# Patient Record
Sex: Female | Born: 1940
Health system: Southern US, Community
[De-identification: ages and names within clinical notes are randomized; demographics above are authoritative.]

## PROBLEM LIST (undated history)

## (undated) DIAGNOSIS — R0602 Shortness of breath: Secondary | ICD-10-CM

## (undated) DIAGNOSIS — R51 Headache: Secondary | ICD-10-CM

## (undated) DIAGNOSIS — G629 Polyneuropathy, unspecified: Secondary | ICD-10-CM

## (undated) DIAGNOSIS — G8929 Other chronic pain: Secondary | ICD-10-CM

## (undated) DIAGNOSIS — Z860100 Personal history of colon polyps, unspecified: Secondary | ICD-10-CM

## (undated) DIAGNOSIS — E785 Hyperlipidemia, unspecified: Secondary | ICD-10-CM

## (undated) DIAGNOSIS — I499 Cardiac arrhythmia, unspecified: Secondary | ICD-10-CM

## (undated) DIAGNOSIS — G2581 Restless legs syndrome: Secondary | ICD-10-CM

## (undated) DIAGNOSIS — K219 Gastro-esophageal reflux disease without esophagitis: Secondary | ICD-10-CM

## (undated) DIAGNOSIS — M1990 Unspecified osteoarthritis, unspecified site: Secondary | ICD-10-CM

## (undated) DIAGNOSIS — M549 Dorsalgia, unspecified: Secondary | ICD-10-CM

## (undated) DIAGNOSIS — H269 Unspecified cataract: Secondary | ICD-10-CM

## (undated) DIAGNOSIS — I10 Essential (primary) hypertension: Secondary | ICD-10-CM

## (undated) DIAGNOSIS — S83242A Other tear of medial meniscus, current injury, left knee, initial encounter: Secondary | ICD-10-CM

## (undated) DIAGNOSIS — M1712 Unilateral primary osteoarthritis, left knee: Secondary | ICD-10-CM

## (undated) DIAGNOSIS — Z9289 Personal history of other medical treatment: Secondary | ICD-10-CM

## (undated) HISTORY — PX: EYE SURGERY: SHX253

## (undated) HISTORY — PX: COLONOSCOPY: SHX174

## (undated) HISTORY — DX: Shortness of breath: R06.02

## (undated) HISTORY — DX: Restless legs syndrome: G25.81

## (undated) HISTORY — DX: Polyneuropathy, unspecified: G62.9

## (undated) HISTORY — PX: UPPER GI ENDOSCOPY: SHX6162

## (undated) HISTORY — PX: APPENDECTOMY: SHX54

---

## 1972-07-20 HISTORY — PX: TUBAL LIGATION: SHX77

## 1998-09-26 ENCOUNTER — Other Ambulatory Visit: Admission: RE | Admit: 1998-09-26 | Discharge: 1998-09-26 | Payer: Self-pay | Admitting: Obstetrics and Gynecology

## 1999-08-07 ENCOUNTER — Ambulatory Visit (HOSPITAL_COMMUNITY): Admission: RE | Admit: 1999-08-07 | Discharge: 1999-08-07 | Payer: Self-pay | Admitting: Internal Medicine

## 1999-08-07 ENCOUNTER — Encounter: Payer: Self-pay | Admitting: Internal Medicine

## 1999-11-05 ENCOUNTER — Ambulatory Visit (HOSPITAL_COMMUNITY): Admission: RE | Admit: 1999-11-05 | Discharge: 1999-11-05 | Payer: Self-pay | Admitting: Internal Medicine

## 1999-11-05 ENCOUNTER — Encounter: Payer: Self-pay | Admitting: Internal Medicine

## 2000-01-30 ENCOUNTER — Other Ambulatory Visit: Admission: RE | Admit: 2000-01-30 | Discharge: 2000-01-30 | Payer: Self-pay | Admitting: Obstetrics and Gynecology

## 2000-06-27 ENCOUNTER — Emergency Department (HOSPITAL_COMMUNITY): Admission: EM | Admit: 2000-06-27 | Discharge: 2000-06-27 | Payer: Self-pay | Admitting: Emergency Medicine

## 2000-08-28 ENCOUNTER — Emergency Department (HOSPITAL_COMMUNITY): Admission: EM | Admit: 2000-08-28 | Discharge: 2000-08-28 | Payer: Self-pay | Admitting: Emergency Medicine

## 2001-01-24 ENCOUNTER — Other Ambulatory Visit: Admission: RE | Admit: 2001-01-24 | Discharge: 2001-01-24 | Payer: Self-pay | Admitting: Obstetrics and Gynecology

## 2001-10-25 ENCOUNTER — Ambulatory Visit (HOSPITAL_COMMUNITY): Admission: RE | Admit: 2001-10-25 | Discharge: 2001-10-25 | Payer: Self-pay | Admitting: *Deleted

## 2001-10-25 ENCOUNTER — Encounter (INDEPENDENT_AMBULATORY_CARE_PROVIDER_SITE_OTHER): Payer: Self-pay | Admitting: Specialist

## 2002-03-02 ENCOUNTER — Other Ambulatory Visit: Admission: RE | Admit: 2002-03-02 | Discharge: 2002-03-02 | Payer: Self-pay | Admitting: Obstetrics & Gynecology

## 2002-03-08 ENCOUNTER — Encounter: Payer: Self-pay | Admitting: Obstetrics & Gynecology

## 2002-03-08 ENCOUNTER — Ambulatory Visit (HOSPITAL_COMMUNITY): Admission: RE | Admit: 2002-03-08 | Discharge: 2002-03-08 | Payer: Self-pay | Admitting: Obstetrics & Gynecology

## 2003-01-29 ENCOUNTER — Encounter: Payer: Self-pay | Admitting: Obstetrics & Gynecology

## 2003-01-29 ENCOUNTER — Ambulatory Visit (HOSPITAL_COMMUNITY): Admission: RE | Admit: 2003-01-29 | Discharge: 2003-01-29 | Payer: Self-pay | Admitting: Obstetrics & Gynecology

## 2003-01-31 ENCOUNTER — Emergency Department (HOSPITAL_COMMUNITY): Admission: EM | Admit: 2003-01-31 | Discharge: 2003-02-01 | Payer: Self-pay | Admitting: Emergency Medicine

## 2005-02-02 ENCOUNTER — Ambulatory Visit (HOSPITAL_COMMUNITY): Admission: RE | Admit: 2005-02-02 | Discharge: 2005-02-02 | Payer: Self-pay | Admitting: Obstetrics & Gynecology

## 2005-10-30 ENCOUNTER — Ambulatory Visit: Payer: Self-pay | Admitting: Internal Medicine

## 2005-11-09 ENCOUNTER — Ambulatory Visit (HOSPITAL_COMMUNITY): Admission: RE | Admit: 2005-11-09 | Discharge: 2005-11-09 | Payer: Self-pay | Admitting: Internal Medicine

## 2005-11-23 ENCOUNTER — Ambulatory Visit: Payer: Self-pay | Admitting: Internal Medicine

## 2007-02-01 ENCOUNTER — Ambulatory Visit (HOSPITAL_COMMUNITY): Admission: RE | Admit: 2007-02-01 | Discharge: 2007-02-01 | Payer: Self-pay | Admitting: Obstetrics & Gynecology

## 2008-07-20 HISTORY — PX: BACK SURGERY: SHX140

## 2008-11-14 ENCOUNTER — Encounter: Admission: RE | Admit: 2008-11-14 | Discharge: 2008-11-14 | Payer: Self-pay | Admitting: Rheumatology

## 2009-04-12 ENCOUNTER — Encounter: Admission: RE | Admit: 2009-04-12 | Discharge: 2009-04-12 | Payer: Self-pay | Admitting: Orthopaedic Surgery

## 2009-05-13 ENCOUNTER — Inpatient Hospital Stay (HOSPITAL_COMMUNITY): Admission: RE | Admit: 2009-05-13 | Discharge: 2009-05-16 | Payer: Self-pay | Admitting: Orthopaedic Surgery

## 2010-05-16 ENCOUNTER — Encounter: Admission: RE | Admit: 2010-05-16 | Discharge: 2010-05-16 | Payer: Self-pay | Admitting: Orthopaedic Surgery

## 2010-08-10 ENCOUNTER — Encounter: Payer: Self-pay | Admitting: Obstetrics & Gynecology

## 2010-10-23 LAB — TYPE AND SCREEN
ABO/RH(D): O POS
Antibody Screen: NEGATIVE

## 2010-10-23 LAB — COMPREHENSIVE METABOLIC PANEL
ALT: 27 U/L (ref 0–35)
AST: 20 U/L (ref 0–37)
Albumin: 4.1 g/dL (ref 3.5–5.2)
Alkaline Phosphatase: 61 U/L (ref 39–117)
Calcium: 9.2 mg/dL (ref 8.4–10.5)
GFR calc non Af Amer: 50 mL/min — ABNORMAL LOW (ref >60)
Potassium: 4.1 mEq/L (ref 3.5–5.1)
Sodium: 141 mEq/L (ref 135–145)
Total Bilirubin: 0.3 mg/dL (ref 0.3–1.2)

## 2010-10-23 LAB — HEMOGLOBIN AND HEMATOCRIT, BLOOD
HCT: 22.8 % — ABNORMAL LOW (ref 36.0–46.0)
HCT: 23.3 % — ABNORMAL LOW (ref 36.0–46.0)
HCT: 24.4 % — ABNORMAL LOW (ref 36.0–46.0)
Hemoglobin: 10.3 g/dL — ABNORMAL LOW (ref 12.0–15.0)
Hemoglobin: 7.7 g/dL — CL (ref 12.0–15.0)
Hemoglobin: 8 g/dL — ABNORMAL LOW (ref 12.0–15.0)
Hemoglobin: 8.3 g/dL — ABNORMAL LOW (ref 12.0–15.0)

## 2010-10-23 LAB — PREPARE RBC (CROSSMATCH)

## 2010-10-23 LAB — URINALYSIS, ROUTINE W REFLEX MICROSCOPIC
Protein, ur: NEGATIVE mg/dL
Specific Gravity, Urine: 1.016 (ref 1.005–1.030)
pH: 7.5 (ref 5.0–8.0)

## 2010-10-23 LAB — CBC: MCV: 92.8 fL (ref 78.0–100.0)

## 2010-10-23 LAB — BASIC METABOLIC PANEL
CO2: 25 mEq/L (ref 19–32)
Calcium: 7.3 mg/dL — ABNORMAL LOW (ref 8.4–10.5)
Chloride: 107 mEq/L (ref 96–112)

## 2010-10-23 LAB — DIFFERENTIAL
Basophils Relative: 0 % (ref 0–1)
Lymphocytes Relative: 51 % — ABNORMAL HIGH (ref 12–46)
Monocytes Absolute: 0.7 10*3/uL (ref 0.1–1.0)
Neutrophils Relative %: 40 % — ABNORMAL LOW (ref 43–77)

## 2010-10-23 LAB — POCT I-STAT 4, (NA,K, GLUC, HGB,HCT)
Glucose, Bld: 127 mg/dL — ABNORMAL HIGH (ref 70–99)
Hemoglobin: 10.9 g/dL — ABNORMAL LOW (ref 12.0–15.0)

## 2010-10-23 LAB — ABO/RH: ABO/RH(D): O POS

## 2010-12-05 NOTE — Procedures (Signed)
[place]. [facility]  Patient:    Melissa Maldonado, Melissa Maldonado Visit Number: [id number] MRN: [id number]          Service Type: END Location: ENDO Attending Physician:  Jim Desanctis Dictated by:   Jim Desanctis, M.D. Proc. Date: 10/25/01 Admit Date:  10/25/2001 Discharge Date: 10/25/2001                             Procedure Report  PROCEDURE PERFORMED:  Colonoscopy.  ENDOSCOPIST:  Jim Desanctis, M.D.  INDICATIONS FOR PROCEDURE:  Colon cancer screening.  ANESTHESIA:  Demerol 20 mg, Versed 2 mg.  DESCRIPTION OF PROCEDURE:  With the patient mildly sedated in the left lateral decubitus position, the Olympus videoscopic colonoscope was inserted in the rectum and passed under direct vision into the cecum.  The cecum was identified by the ileocecal valve and appendiceal orifice, both of which were photographed.  From this point, the colonoscope was slowly withdrawn, taking circumferential views of the entire colonic mucosa, stopping only in the rectum which appeared normal on direct and showed hemorrhoids on retroflex view.  The endoscope was straightened and withdrawn.  Patients vital signs and pulse oximeter remained stable.  The patient tolerated the procedure well and without apparent complications.  FINDINGS:  Internal hemorrhoids.  Otherwise unremarkable examination.  PLAN:  See endoscopy note for further details. Dictated by:   Jim Desanctis, M.D. Attending Physician:  Jim Desanctis DD:  10/25/01 TD:  10/25/01 Job: [id number]

## 2010-12-05 NOTE — Procedures (Signed)
[place]. [facility]  Patient:    Melissa Maldonado, Melissa Maldonado Visit Number: [id number] MRN: [id number]          Service Type: END Location: ENDO Attending Physician:  Jim Desanctis Dictated by:   Jim Desanctis, M.D. Admit Date:  10/25/2001 Discharge Date: 10/25/2001                             Procedure Report  PROCEDURE:  Upper endoscopy.  INDICATION:  GERD.  ANESTHESIA:  Demerol 50 mg, Versed 5 mg.  DESCRIPTION OF PROCEDURE:  With the patient mildly sedated in the left lateral decubitus position, the Olympus videoscopic endoscope was inserted in the mouth, passed under direct vision through the esophagus, which appeared normal, into the stomach.  Fundus, body were seen, as was the antrum. Ulcerations were seen in the antrum and body, which were photographed and biopsied.  We entered into the duodenal bulb and second portion of the duodenum, and both appeared normal.  From this point the endoscope was slowly withdrawn, taking circumferential views of the entire duodenal mucosa until the endoscope had been pulled back in the stomach, placed in retroflexion to view the stomach from below.  The endoscope was then straightened and withdrawn, taking circumferential views of the remaining gastric and esophageal mucosa.  The patients vital signs and pulse oximetry remained stable.  The patient tolerated the procedure well without apparent complications.  FINDINGS:  Ulcers of the stomach in antrum and body, biopsied.  PLAN:  Await biopsy report.  The patient will call me for results and follow up with me as an outpatient.  Proceed to colonoscopy as planned. Dictated by:   Jim Desanctis, M.D. Attending Physician:  Jim Desanctis DD:  10/25/01 TD:  10/25/01 Job: [id number]

## 2011-05-04 ENCOUNTER — Other Ambulatory Visit (HOSPITAL_COMMUNITY): Payer: Self-pay | Admitting: Neurological Surgery

## 2011-05-04 DIAGNOSIS — M545 Low back pain, unspecified: Secondary | ICD-10-CM

## 2011-05-07 ENCOUNTER — Ambulatory Visit (HOSPITAL_COMMUNITY)
Admission: RE | Admit: 2011-05-07 | Discharge: 2011-05-07 | Disposition: A | Discharge status: Home / Self Care | Payer: Medicare Other | Source: Ambulatory Visit | Attending: Neurological Surgery | Admitting: Neurological Surgery

## 2011-05-07 DIAGNOSIS — M51379 Other intervertebral disc degeneration, lumbosacral region without mention of lumbar back pain or lower extremity pain: Secondary | ICD-10-CM | POA: Insufficient documentation

## 2011-05-07 DIAGNOSIS — M545 Low back pain: Secondary | ICD-10-CM

## 2011-05-07 DIAGNOSIS — M5380 Other specified dorsopathies, site unspecified: Secondary | ICD-10-CM | POA: Insufficient documentation

## 2011-05-07 DIAGNOSIS — IMO0002 Reserved for concepts with insufficient information to code with codable children: Secondary | ICD-10-CM | POA: Insufficient documentation

## 2011-05-07 DIAGNOSIS — M5137 Other intervertebral disc degeneration, lumbosacral region: Secondary | ICD-10-CM | POA: Insufficient documentation

## 2011-05-07 MED ORDER — IOHEXOL 180 MG/ML  SOLN
14.0000 mL | Freq: Once | INTRAMUSCULAR | Status: AC | PRN
  Administered 2011-05-07: 14 mL via INTRATHECAL

## 2011-07-21 DIAGNOSIS — R0602 Shortness of breath: Secondary | ICD-10-CM

## 2011-07-21 HISTORY — DX: Shortness of breath: R06.02

## 2011-07-29 DIAGNOSIS — R0602 Shortness of breath: Secondary | ICD-10-CM | POA: Diagnosis not present

## 2011-08-05 DIAGNOSIS — M545 Low back pain, unspecified: Secondary | ICD-10-CM | POA: Diagnosis not present

## 2011-08-10 DIAGNOSIS — M545 Low back pain: Secondary | ICD-10-CM | POA: Diagnosis not present

## 2011-08-10 DIAGNOSIS — M25559 Pain in unspecified hip: Secondary | ICD-10-CM | POA: Diagnosis not present

## 2011-08-14 DIAGNOSIS — M545 Low back pain: Secondary | ICD-10-CM | POA: Diagnosis not present

## 2011-08-14 DIAGNOSIS — M25559 Pain in unspecified hip: Secondary | ICD-10-CM | POA: Diagnosis not present

## 2011-08-17 DIAGNOSIS — I2510 Atherosclerotic heart disease of native coronary artery without angina pectoris: Secondary | ICD-10-CM | POA: Diagnosis not present

## 2011-09-17 DIAGNOSIS — E7800 Pure hypercholesterolemia, unspecified: Secondary | ICD-10-CM | POA: Diagnosis not present

## 2011-09-17 DIAGNOSIS — Z79899 Other long term (current) drug therapy: Secondary | ICD-10-CM | POA: Diagnosis not present

## 2011-09-21 DIAGNOSIS — M2550 Pain in unspecified joint: Secondary | ICD-10-CM | POA: Diagnosis not present

## 2011-09-21 DIAGNOSIS — Z79899 Other long term (current) drug therapy: Secondary | ICD-10-CM | POA: Diagnosis not present

## 2011-09-21 DIAGNOSIS — E7800 Pure hypercholesterolemia, unspecified: Secondary | ICD-10-CM | POA: Diagnosis not present

## 2011-09-30 DIAGNOSIS — M545 Low back pain, unspecified: Secondary | ICD-10-CM | POA: Diagnosis not present

## 2011-10-05 DIAGNOSIS — M4310 Spondylolisthesis, site unspecified: Secondary | ICD-10-CM | POA: Diagnosis not present

## 2011-10-05 DIAGNOSIS — M1710 Unilateral primary osteoarthritis, unspecified knee: Secondary | ICD-10-CM | POA: Diagnosis not present

## 2011-10-05 DIAGNOSIS — M5137 Other intervertebral disc degeneration, lumbosacral region: Secondary | ICD-10-CM | POA: Diagnosis not present

## 2011-10-08 DIAGNOSIS — M545 Low back pain, unspecified: Secondary | ICD-10-CM | POA: Diagnosis not present

## 2011-10-08 DIAGNOSIS — IMO0002 Reserved for concepts with insufficient information to code with codable children: Secondary | ICD-10-CM | POA: Diagnosis not present

## 2011-11-02 DIAGNOSIS — M1710 Unilateral primary osteoarthritis, unspecified knee: Secondary | ICD-10-CM | POA: Diagnosis not present

## 2011-11-05 DIAGNOSIS — M545 Low back pain, unspecified: Secondary | ICD-10-CM | POA: Diagnosis not present

## 2011-11-05 DIAGNOSIS — M5137 Other intervertebral disc degeneration, lumbosacral region: Secondary | ICD-10-CM | POA: Diagnosis not present

## 2011-11-05 DIAGNOSIS — M961 Postlaminectomy syndrome, not elsewhere classified: Secondary | ICD-10-CM | POA: Diagnosis not present

## 2011-12-18 DIAGNOSIS — Z0000 Encounter for general adult medical examination without abnormal findings: Secondary | ICD-10-CM | POA: Diagnosis not present

## 2011-12-18 DIAGNOSIS — R61 Generalized hyperhidrosis: Secondary | ICD-10-CM | POA: Diagnosis not present

## 2011-12-18 DIAGNOSIS — I10 Essential (primary) hypertension: Secondary | ICD-10-CM | POA: Diagnosis not present

## 2011-12-23 DIAGNOSIS — R61 Generalized hyperhidrosis: Secondary | ICD-10-CM | POA: Diagnosis not present

## 2011-12-23 DIAGNOSIS — M1990 Unspecified osteoarthritis, unspecified site: Secondary | ICD-10-CM | POA: Diagnosis not present

## 2011-12-23 DIAGNOSIS — Z0000 Encounter for general adult medical examination without abnormal findings: Secondary | ICD-10-CM | POA: Diagnosis not present

## 2011-12-23 DIAGNOSIS — G2581 Restless legs syndrome: Secondary | ICD-10-CM | POA: Diagnosis not present

## 2011-12-23 DIAGNOSIS — R748 Abnormal levels of other serum enzymes: Secondary | ICD-10-CM | POA: Diagnosis not present

## 2011-12-30 DIAGNOSIS — M961 Postlaminectomy syndrome, not elsewhere classified: Secondary | ICD-10-CM | POA: Diagnosis not present

## 2012-01-13 DIAGNOSIS — M961 Postlaminectomy syndrome, not elsewhere classified: Secondary | ICD-10-CM | POA: Diagnosis not present

## 2012-01-18 DIAGNOSIS — Z803 Family history of malignant neoplasm of breast: Secondary | ICD-10-CM | POA: Diagnosis not present

## 2012-01-18 DIAGNOSIS — Z1231 Encounter for screening mammogram for malignant neoplasm of breast: Secondary | ICD-10-CM | POA: Diagnosis not present

## 2012-02-17 DIAGNOSIS — N951 Menopausal and female climacteric states: Secondary | ICD-10-CM | POA: Diagnosis not present

## 2012-02-17 DIAGNOSIS — N760 Acute vaginitis: Secondary | ICD-10-CM | POA: Diagnosis not present

## 2012-02-23 DIAGNOSIS — N951 Menopausal and female climacteric states: Secondary | ICD-10-CM | POA: Diagnosis not present

## 2012-03-08 ENCOUNTER — Other Ambulatory Visit: Payer: Self-pay | Admitting: Gastroenterology

## 2012-03-08 DIAGNOSIS — D129 Benign neoplasm of anus and anal canal: Secondary | ICD-10-CM | POA: Diagnosis not present

## 2012-03-08 DIAGNOSIS — D126 Benign neoplasm of colon, unspecified: Secondary | ICD-10-CM | POA: Diagnosis not present

## 2012-03-08 DIAGNOSIS — D128 Benign neoplasm of rectum: Secondary | ICD-10-CM | POA: Diagnosis not present

## 2012-03-08 DIAGNOSIS — K621 Rectal polyp: Secondary | ICD-10-CM | POA: Diagnosis not present

## 2012-03-08 DIAGNOSIS — K5730 Diverticulosis of large intestine without perforation or abscess without bleeding: Secondary | ICD-10-CM | POA: Diagnosis not present

## 2012-03-08 DIAGNOSIS — K620 Anal polyp: Secondary | ICD-10-CM | POA: Diagnosis not present

## 2012-03-08 DIAGNOSIS — Z1211 Encounter for screening for malignant neoplasm of colon: Secondary | ICD-10-CM | POA: Diagnosis not present

## 2012-03-08 DIAGNOSIS — K648 Other hemorrhoids: Secondary | ICD-10-CM | POA: Diagnosis not present

## 2012-03-15 DIAGNOSIS — H8110 Benign paroxysmal vertigo, unspecified ear: Secondary | ICD-10-CM | POA: Diagnosis not present

## 2012-03-23 DIAGNOSIS — B373 Candidiasis of vulva and vagina: Secondary | ICD-10-CM | POA: Diagnosis not present

## 2012-03-23 DIAGNOSIS — N951 Menopausal and female climacteric states: Secondary | ICD-10-CM | POA: Diagnosis not present

## 2012-03-24 DIAGNOSIS — H8110 Benign paroxysmal vertigo, unspecified ear: Secondary | ICD-10-CM | POA: Diagnosis not present

## 2012-04-07 DIAGNOSIS — H8110 Benign paroxysmal vertigo, unspecified ear: Secondary | ICD-10-CM | POA: Diagnosis not present

## 2012-04-13 DIAGNOSIS — M48061 Spinal stenosis, lumbar region without neurogenic claudication: Secondary | ICD-10-CM | POA: Diagnosis not present

## 2012-04-18 ENCOUNTER — Other Ambulatory Visit: Payer: Self-pay | Admitting: Neurological Surgery

## 2012-04-27 DIAGNOSIS — R0602 Shortness of breath: Secondary | ICD-10-CM | POA: Diagnosis not present

## 2012-04-27 DIAGNOSIS — I10 Essential (primary) hypertension: Secondary | ICD-10-CM | POA: Diagnosis not present

## 2012-04-27 DIAGNOSIS — I498 Other specified cardiac arrhythmias: Secondary | ICD-10-CM | POA: Diagnosis not present

## 2012-04-28 ENCOUNTER — Encounter (HOSPITAL_COMMUNITY): Payer: Self-pay | Admitting: Pharmacy Technician

## 2012-05-05 ENCOUNTER — Encounter (HOSPITAL_COMMUNITY)
Admission: RE | Admit: 2012-05-05 | Discharge: 2012-05-05 | Payer: Medicare Other | Source: Ambulatory Visit | Attending: Neurological Surgery | Admitting: Neurological Surgery

## 2012-05-05 NOTE — Pre-Procedure Instructions (Signed)
[place]  05/05/2012   Your procedure is scheduled on:  Fri, Oct 25 @ 11:25 AM  Report to [place] at 8:15 AM.  Call this number if you have problems the morning of surgery: [phone]   Remember:   Do not eat food:After Midnight.       Do not wear jewelry, make-up or nail polish.  Do not wear lotions, powders, or perfumes. You may wear deodorant.  Do not shave 48 hours prior to surgery.  Do not bring valuables to the hospital.  Contacts, dentures or bridgework may not be worn into surgery.  Leave suitcase in the car. After surgery it may be brought to your room.  For patients admitted to the hospital, checkout time is 11:00 AM the day of discharge.   Patients discharged the day of surgery will not be allowed to drive home.  Special Instructions: Shower using CHG 2 nights before surgery and the night before surgery.  If you shower the day of surgery use CHG.  Use special wash - you have one bottle of CHG for all showers.  You should use approximately 1/3 of the bottle for each shower.   Please read over the following fact sheets that you were given: Pain Booklet, Coughing and Deep Breathing, MRSA Information and Surgical Site Infection Prevention

## 2012-05-09 DIAGNOSIS — Z01810 Encounter for preprocedural cardiovascular examination: Secondary | ICD-10-CM | POA: Diagnosis not present

## 2012-05-09 DIAGNOSIS — R9439 Abnormal result of other cardiovascular function study: Secondary | ICD-10-CM | POA: Diagnosis not present

## 2012-05-09 DIAGNOSIS — I10 Essential (primary) hypertension: Secondary | ICD-10-CM | POA: Diagnosis not present

## 2012-05-09 DIAGNOSIS — E785 Hyperlipidemia, unspecified: Secondary | ICD-10-CM | POA: Diagnosis not present

## 2012-05-09 DIAGNOSIS — I498 Other specified cardiac arrhythmias: Secondary | ICD-10-CM | POA: Diagnosis not present

## 2012-05-10 ENCOUNTER — Encounter (HOSPITAL_COMMUNITY)
Admission: RE | Admit: 2012-05-10 | Discharge: 2012-05-10 | Disposition: A | Discharge status: Home / Self Care | Payer: Medicare Other | Source: Ambulatory Visit | Attending: Neurological Surgery | Admitting: Neurological Surgery

## 2012-05-10 ENCOUNTER — Encounter (HOSPITAL_COMMUNITY): Payer: Self-pay

## 2012-05-10 DIAGNOSIS — R0602 Shortness of breath: Secondary | ICD-10-CM | POA: Diagnosis not present

## 2012-05-10 DIAGNOSIS — I10 Essential (primary) hypertension: Secondary | ICD-10-CM | POA: Diagnosis not present

## 2012-05-10 HISTORY — DX: Unspecified osteoarthritis, unspecified site: M19.90

## 2012-05-10 HISTORY — DX: Hyperlipidemia, unspecified: E78.5

## 2012-05-10 HISTORY — DX: Other chronic pain: M54.9

## 2012-05-10 HISTORY — DX: Personal history of colonic polyps: Z86.010

## 2012-05-10 HISTORY — DX: Essential (primary) hypertension: I10

## 2012-05-10 HISTORY — DX: Personal history of colon polyps, unspecified: Z86.0100

## 2012-05-10 HISTORY — DX: Unspecified cataract: H26.9

## 2012-05-10 HISTORY — DX: Headache: R51

## 2012-05-10 HISTORY — DX: Gastro-esophageal reflux disease without esophagitis: K21.9

## 2012-05-10 HISTORY — DX: Personal history of other medical treatment: Z92.89

## 2012-05-10 HISTORY — DX: Other chronic pain: G89.29

## 2012-05-10 HISTORY — DX: Shortness of breath: R06.02

## 2012-05-10 LAB — BASIC METABOLIC PANEL
BUN: 12 mg/dL (ref 6–23)
CO2: 28 mEq/L (ref 19–32)
Chloride: 102 mEq/L (ref 96–112)
Creatinine, Ser: 0.96 mg/dL (ref 0.50–1.10)
GFR calc Af Amer: 68 mL/min — ABNORMAL LOW (ref >90)
Glucose, Bld: 85 mg/dL (ref 70–99)
Potassium: 3.8 mEq/L (ref 3.5–5.1)

## 2012-05-10 LAB — SURGICAL PCR SCREEN
MRSA, PCR: NEGATIVE
Staphylococcus aureus: NEGATIVE

## 2012-05-10 LAB — CBC
HCT: 41.4 % (ref 36.0–46.0)
Hemoglobin: 13.8 g/dL (ref 12.0–15.0)
MCH: 29.9 pg (ref 26.0–34.0)
MCHC: 33.3 g/dL (ref 30.0–36.0)
MCV: 89.6 fL (ref 78.0–100.0)
RDW: 13 % (ref 11.5–15.5)

## 2012-05-10 NOTE — Progress Notes (Signed)
Dr.Henry Tamala Julian is cardiologist-last visit on 05/09/12-to request report   Pt is wearing a holter monitor-placed yesterday d/t heart rate low-to return to his office today  Stress test done-Dr.Pharr with [facility] on [address] about a yr ago Doesn't recall ever having an echo or heart cath  Medical MD is Dr.Pharr   EKG done yesterday with Dr.Smith-to request report Denies CXR being done in the past yr

## 2012-05-11 DIAGNOSIS — I499 Cardiac arrhythmia, unspecified: Secondary | ICD-10-CM | POA: Diagnosis not present

## 2012-05-11 NOTE — Progress Notes (Signed)
Fax from Dr. Pennie Banter office reports no ECHO and stress.  Office Visit notes received on placed in physical chart.

## 2012-05-12 DIAGNOSIS — I498 Other specified cardiac arrhythmias: Secondary | ICD-10-CM | POA: Diagnosis not present

## 2012-05-12 MED ORDER — CEFAZOLIN SODIUM-DEXTROSE 2-3 GM-% IV SOLR
2.0000 g | INTRAVENOUS | Status: AC
  Administered 2012-05-13: 2 g via INTRAVENOUS
  Filled 2012-05-12: qty 50

## 2012-05-13 ENCOUNTER — Inpatient Hospital Stay (HOSPITAL_COMMUNITY)
Admission: RE | Admit: 2012-05-13 | Discharge: 2012-05-14 | DRG: 491 | Disposition: A | Discharge status: Home / Self Care | Payer: Medicare Other | Source: Ambulatory Visit | Attending: Neurological Surgery | Admitting: Neurological Surgery

## 2012-05-13 ENCOUNTER — Ambulatory Visit (HOSPITAL_COMMUNITY): Payer: Medicare Other

## 2012-05-13 ENCOUNTER — Encounter (HOSPITAL_COMMUNITY): Payer: Self-pay | Admitting: Vascular Surgery

## 2012-05-13 ENCOUNTER — Encounter (HOSPITAL_COMMUNITY): Payer: Self-pay

## 2012-05-13 ENCOUNTER — Encounter (HOSPITAL_COMMUNITY)
Admission: RE | Disposition: A | Discharge status: Home / Self Care | Payer: Self-pay | Source: Ambulatory Visit | Attending: Neurological Surgery

## 2012-05-13 ENCOUNTER — Ambulatory Visit (HOSPITAL_COMMUNITY): Payer: Medicare Other | Admitting: Vascular Surgery

## 2012-05-13 DIAGNOSIS — Z79899 Other long term (current) drug therapy: Secondary | ICD-10-CM | POA: Diagnosis not present

## 2012-05-13 DIAGNOSIS — IMO0002 Reserved for concepts with insufficient information to code with codable children: Secondary | ICD-10-CM | POA: Diagnosis present

## 2012-05-13 DIAGNOSIS — Z01812 Encounter for preprocedural laboratory examination: Secondary | ICD-10-CM

## 2012-05-13 DIAGNOSIS — M48062 Spinal stenosis, lumbar region with neurogenic claudication: Secondary | ICD-10-CM | POA: Diagnosis not present

## 2012-05-13 DIAGNOSIS — R0602 Shortness of breath: Secondary | ICD-10-CM | POA: Diagnosis not present

## 2012-05-13 DIAGNOSIS — G8929 Other chronic pain: Secondary | ICD-10-CM | POA: Diagnosis not present

## 2012-05-13 DIAGNOSIS — M48061 Spinal stenosis, lumbar region without neurogenic claudication: Secondary | ICD-10-CM | POA: Diagnosis not present

## 2012-05-13 DIAGNOSIS — Z886 Allergy status to analgesic agent status: Secondary | ICD-10-CM | POA: Diagnosis not present

## 2012-05-13 DIAGNOSIS — H409 Unspecified glaucoma: Secondary | ICD-10-CM | POA: Diagnosis present

## 2012-05-13 DIAGNOSIS — M519 Unspecified thoracic, thoracolumbar and lumbosacral intervertebral disc disorder: Secondary | ICD-10-CM | POA: Diagnosis not present

## 2012-05-13 DIAGNOSIS — K219 Gastro-esophageal reflux disease without esophagitis: Secondary | ICD-10-CM | POA: Diagnosis not present

## 2012-05-13 DIAGNOSIS — I10 Essential (primary) hypertension: Secondary | ICD-10-CM | POA: Diagnosis not present

## 2012-05-13 HISTORY — PX: LUMBAR LAMINECTOMY/DECOMPRESSION MICRODISCECTOMY: SHX5026

## 2012-05-13 SURGERY — LUMBAR LAMINECTOMY/DECOMPRESSION MICRODISCECTOMY 1 LEVEL
Anesthesia: General | Site: Back | Laterality: Bilateral | Wound class: Clean

## 2012-05-13 MED ORDER — MENTHOL 3 MG MT LOZG
1.0000 | LOZENGE | OROMUCOSAL | Status: DC | PRN
  Administered 2012-05-13: 3 mg via ORAL
  Filled 2012-05-13: qty 9

## 2012-05-13 MED ORDER — SUFENTANIL CITRATE 50 MCG/ML IV SOLN
INTRAVENOUS | Status: DC | PRN
  Administered 2012-05-13: 15 ug via INTRAVENOUS
  Administered 2012-05-13: 5 ug via INTRAVENOUS

## 2012-05-13 MED ORDER — KETOROLAC TROMETHAMINE 15 MG/ML IJ SOLN
15.0000 mg | Freq: Four times a day (QID) | INTRAMUSCULAR | Status: DC
  Administered 2012-05-13 – 2012-05-14 (×3): 15 mg via INTRAVENOUS
  Filled 2012-05-13 (×5): qty 1

## 2012-05-13 MED ORDER — THROMBIN 5000 UNITS EX SOLR
CUTANEOUS | Status: DC | PRN
  Administered 2012-05-13 (×2): 5000 [IU] via TOPICAL

## 2012-05-13 MED ORDER — BACITRACIN 50000 UNITS IM SOLR
INTRAMUSCULAR | Status: AC
  Filled 2012-05-13: qty 1

## 2012-05-13 MED ORDER — MORPHINE SULFATE 2 MG/ML IJ SOLN
1.0000 mg | INTRAMUSCULAR | Status: DC | PRN
Start: 2012-05-13 — End: 2012-05-14

## 2012-05-13 MED ORDER — MIDAZOLAM HCL 5 MG/5ML IJ SOLN
INTRAMUSCULAR | Status: DC | PRN
  Administered 2012-05-13: 2 mg via INTRAVENOUS

## 2012-05-13 MED ORDER — LIDOCAINE-EPINEPHRINE 1 %-1:100000 IJ SOLN
INTRAMUSCULAR | Status: DC | PRN
  Administered 2012-05-13: 10 mL

## 2012-05-13 MED ORDER — LOSARTAN POTASSIUM 25 MG PO TABS
25.0000 mg | ORAL_TABLET | Freq: Every day | ORAL | Status: DC
  Administered 2012-05-13 – 2012-05-14 (×2): 25 mg via ORAL
  Filled 2012-05-13 (×2): qty 1

## 2012-05-13 MED ORDER — DIAZEPAM 5 MG PO TABS: 5.0000 mg | ORAL_TABLET | Freq: Four times a day (QID) | ORAL | Status: DC | PRN

## 2012-05-13 MED ORDER — PHENOL 1.4 % MT LIQD: 1.0000 | OROMUCOSAL | Status: DC | PRN

## 2012-05-13 MED ORDER — OXYCODONE-ACETAMINOPHEN 5-325 MG PO TABS
1.0000 | ORAL_TABLET | ORAL | Status: DC | PRN
  Administered 2012-05-13 (×2): 2 via ORAL
  Filled 2012-05-13 (×2): qty 2

## 2012-05-13 MED ORDER — ARTIFICIAL TEARS OP OINT: TOPICAL_OINTMENT | OPHTHALMIC | Status: DC | PRN

## 2012-05-13 MED ORDER — LACTATED RINGERS IV SOLN
INTRAVENOUS | Status: DC | PRN
  Administered 2012-05-13 (×2): via INTRAVENOUS

## 2012-05-13 MED ORDER — GLYCOPYRROLATE 0.2 MG/ML IJ SOLN
INTRAMUSCULAR | Status: DC | PRN
  Administered 2012-05-13 (×2): 0.2 mg via INTRAVENOUS
  Administered 2012-05-13: 0.4 mg via INTRAVENOUS

## 2012-05-13 MED ORDER — SODIUM CHLORIDE 0.9 % IR SOLN
Status: DC | PRN
  Administered 2012-05-13: 13:00:00

## 2012-05-13 MED ORDER — ONDANSETRON HCL 4 MG/2ML IJ SOLN: 4.0000 mg | INTRAMUSCULAR | Status: DC | PRN

## 2012-05-13 MED ORDER — SODIUM CHLORIDE 0.9 % IJ SOLN: 3.0000 mL | INTRAMUSCULAR | Status: DC | PRN

## 2012-05-13 MED ORDER — HYDROMORPHONE HCL PF 1 MG/ML IJ SOLN: 0.2500 mg | INTRAMUSCULAR | Status: DC | PRN

## 2012-05-13 MED ORDER — CEFAZOLIN SODIUM 1-5 GM-% IV SOLN
1.0000 g | Freq: Three times a day (TID) | INTRAVENOUS | Status: AC
  Administered 2012-05-13 – 2012-05-14 (×2): 1 g via INTRAVENOUS
  Filled 2012-05-13 (×2): qty 50

## 2012-05-13 MED ORDER — HEMOSTATIC AGENTS (NO CHARGE) OPTIME
TOPICAL | Status: DC | PRN
  Administered 2012-05-13: 1 via TOPICAL

## 2012-05-13 MED ORDER — ALUM & MAG HYDROXIDE-SIMETH 200-200-20 MG/5ML PO SUSP: 30.0000 mL | Freq: Four times a day (QID) | ORAL | Status: DC | PRN

## 2012-05-13 MED ORDER — ACETAMINOPHEN 325 MG PO TABS: 650.0000 mg | ORAL_TABLET | ORAL | Status: DC | PRN

## 2012-05-13 MED ORDER — ATORVASTATIN CALCIUM 40 MG PO TABS
40.0000 mg | ORAL_TABLET | Freq: Every day | ORAL | Status: DC
  Administered 2012-05-13 – 2012-05-14 (×2): 40 mg via ORAL
  Filled 2012-05-13 (×2): qty 1

## 2012-05-13 MED ORDER — ONDANSETRON HCL 4 MG/2ML IJ SOLN
INTRAMUSCULAR | Status: DC | PRN
  Administered 2012-05-13: 4 mg via INTRAVENOUS

## 2012-05-13 MED ORDER — 0.9 % SODIUM CHLORIDE (POUR BTL) OPTIME
TOPICAL | Status: DC | PRN
  Administered 2012-05-13: 1000 mL

## 2012-05-13 MED ORDER — ROCURONIUM BROMIDE 100 MG/10ML IV SOLN
INTRAVENOUS | Status: DC | PRN
  Administered 2012-05-13: 50 mg via INTRAVENOUS

## 2012-05-13 MED ORDER — SODIUM CHLORIDE 0.9 % IV SOLN
INTRAVENOUS | Status: AC
  Filled 2012-05-13: qty 500

## 2012-05-13 MED ORDER — SODIUM CHLORIDE 0.9 % IV SOLN: 250.0000 mL | INTRAVENOUS | Status: DC

## 2012-05-13 MED ORDER — NEOSTIGMINE METHYLSULFATE 1 MG/ML IJ SOLN
INTRAMUSCULAR | Status: DC | PRN
  Administered 2012-05-13: 3 mg via INTRAVENOUS

## 2012-05-13 MED ORDER — EPHEDRINE SULFATE 50 MG/ML IJ SOLN
INTRAMUSCULAR | Status: DC | PRN
  Administered 2012-05-13 (×3): 5 mg via INTRAVENOUS

## 2012-05-13 MED ORDER — PROMETHAZINE HCL 25 MG/ML IJ SOLN: 6.2500 mg | INTRAMUSCULAR | Status: DC | PRN

## 2012-05-13 MED ORDER — LIDOCAINE HCL (CARDIAC) 20 MG/ML IV SOLN
INTRAVENOUS | Status: DC | PRN
  Administered 2012-05-13: 90 mg via INTRAVENOUS

## 2012-05-13 MED ORDER — BUPIVACAINE HCL (PF) 0.5 % IJ SOLN
INTRAMUSCULAR | Status: DC | PRN
  Administered 2012-05-13: 20 mL

## 2012-05-13 MED ORDER — ACETAMINOPHEN 650 MG RE SUPP: 650.0000 mg | RECTAL | Status: DC | PRN

## 2012-05-13 MED ORDER — PROPOFOL 10 MG/ML IV BOLUS
INTRAVENOUS | Status: DC | PRN
  Administered 2012-05-13: 20 mg via INTRAVENOUS
  Administered 2012-05-13: 160 mg via INTRAVENOUS

## 2012-05-13 MED ORDER — SODIUM CHLORIDE 0.9 % IJ SOLN: 3.0000 mL | Freq: Two times a day (BID) | INTRAMUSCULAR | Status: DC

## 2012-05-13 SURGICAL SUPPLY — 54 items
ADH SKN CLS APL DERMABOND .7 (GAUZE/BANDAGES/DRESSINGS) ×1
BAG DECANTER FOR FLEXI CONT (MISCELLANEOUS) ×2 IMPLANT
BLADE SURG ROTATE 9660 (MISCELLANEOUS) IMPLANT
BUR ACORN 6.0 (BURR) IMPLANT
BUR MATCHSTICK NEURO 3.0 LAGG (BURR) ×2 IMPLANT
CANISTER SUCTION 2500CC (MISCELLANEOUS) ×2 IMPLANT
CLOTH BEACON ORANGE TIMEOUT ST (SAFETY) ×2 IMPLANT
CONT SPEC 4OZ CLIKSEAL STRL BL (MISCELLANEOUS) ×2 IMPLANT
DECANTER SPIKE VIAL GLASS SM (MISCELLANEOUS) ×2 IMPLANT
DERMABOND ADVANCED (GAUZE/BANDAGES/DRESSINGS) ×1
DERMABOND ADVANCED .7 DNX12 (GAUZE/BANDAGES/DRESSINGS) ×1 IMPLANT
DRAPE MICROSCOPE LEICA (MISCELLANEOUS) IMPLANT
DRAPE PED LAPAROTOMY (DRAPES) ×2 IMPLANT
DRAPE POUCH INSTRU U-SHP 10X18 (DRAPES) ×2 IMPLANT
DRAPE PROXIMA HALF (DRAPES) IMPLANT
DURAPREP 26ML APPLICATOR (WOUND CARE) ×2 IMPLANT
ELECT REM PT RETURN 9FT ADLT (ELECTROSURGICAL) ×2
ELECTRODE REM PT RTRN 9FT ADLT (ELECTROSURGICAL) ×1 IMPLANT
GAUZE SPONGE 4X4 16PLY XRAY LF (GAUZE/BANDAGES/DRESSINGS) IMPLANT
GLOVE BIO SURGEON STRL SZ8 (GLOVE) ×2 IMPLANT
GLOVE BIOGEL PI IND STRL 8.5 (GLOVE) ×1 IMPLANT
GLOVE BIOGEL PI INDICATOR 8.5 (GLOVE) ×1
GLOVE ECLIPSE 7.5 STRL STRAW (GLOVE) ×2 IMPLANT
GLOVE ECLIPSE 8.5 STRL (GLOVE) ×2 IMPLANT
GLOVE EXAM NITRILE LRG STRL (GLOVE) IMPLANT
GLOVE EXAM NITRILE MD LF STRL (GLOVE) IMPLANT
GLOVE EXAM NITRILE XL STR (GLOVE) IMPLANT
GLOVE EXAM NITRILE XS STR PU (GLOVE) IMPLANT
GLOVE INDICATOR 7.5 STRL GRN (GLOVE) ×1 IMPLANT
GLOVE INDICATOR 8.5 STRL (GLOVE) ×1 IMPLANT
GLOVE OPTIFIT SS 8.5 STRL (GLOVE) ×1 IMPLANT
GOWN BRE IMP SLV AUR LG STRL (GOWN DISPOSABLE) IMPLANT
GOWN BRE IMP SLV AUR XL STRL (GOWN DISPOSABLE) IMPLANT
GOWN STRL REIN 2XL LVL4 (GOWN DISPOSABLE) ×2 IMPLANT
KIT BASIN OR (CUSTOM PROCEDURE TRAY) ×2 IMPLANT
KIT ROOM TURNOVER OR (KITS) ×2 IMPLANT
NDL SPNL 20GX3.5 QUINCKE YW (NEEDLE) IMPLANT
NEEDLE HYPO 22GX1.5 SAFETY (NEEDLE) ×2 IMPLANT
NEEDLE SPNL 20GX3.5 QUINCKE YW (NEEDLE) IMPLANT
NS IRRIG 1000ML POUR BTL (IV SOLUTION) ×2 IMPLANT
PACK LAMINECTOMY NEURO (CUSTOM PROCEDURE TRAY) ×2 IMPLANT
PAD ARMBOARD 7.5X6 YLW CONV (MISCELLANEOUS) ×6 IMPLANT
PATTIES SURGICAL .5 X1 (DISPOSABLE) ×2 IMPLANT
RUBBERBAND STERILE (MISCELLANEOUS) IMPLANT
SPONGE GAUZE 4X4 12PLY (GAUZE/BANDAGES/DRESSINGS) ×2 IMPLANT
SPONGE SURGIFOAM ABS GEL SZ50 (HEMOSTASIS) ×2 IMPLANT
SUT VIC AB 1 CT1 18XBRD ANBCTR (SUTURE) ×1 IMPLANT
SUT VIC AB 1 CT1 8-18 (SUTURE) ×2
SUT VIC AB 2-0 CP2 18 (SUTURE) ×2 IMPLANT
SUT VIC AB 3-0 SH 8-18 (SUTURE) ×3 IMPLANT
SYR 20ML ECCENTRIC (SYRINGE) ×2 IMPLANT
TOWEL OR 17X24 6PK STRL BLUE (TOWEL DISPOSABLE) ×2 IMPLANT
TOWEL OR 17X26 10 PK STRL BLUE (TOWEL DISPOSABLE) ×2 IMPLANT
WATER STERILE IRR 1000ML POUR (IV SOLUTION) ×2 IMPLANT

## 2012-05-13 NOTE — H&P (Signed)
CHIEF COMPLAINT:   Pain in the lower back status post surgical decompression and stabilization of L2-3 and L4-5 May 05, 2009.    HISTORY OF PRESENT ILLNESS:  Melissa Maldonado is a 71 year old, right-handed individual who tells me that she has had problems with chronic low back pain.  She had some difficulties with pain in her back prior to surgery, but in October of 2010 she underwent surgical decompression at L2-3 and L4-5 with a lumbar laminectomy from L2 to L5 via Dr. Rodell Perna.  She was initially hopeful that she was getting better, but over time she has noted that she has had persistence of pain in the center of the low back radiating into both buttocks.  This has become quite severe and extreme at times.  She tries to maintain an active lifestyle and she notes that she likes to walk every day, but somehow she has not been able to shake the pain in her back at all.  She has seen by Dr. Lorin Mercy a number of times and most recently he had given her a shot and advised her to contact him, but she notes that nothing has been changing and nothing substantive has been done.  She brings with her a CD of some films, including a CT scan of the lumbar spine performed on May 16, 2010.  The CT demonstrates posterolateral fixation at L2-3 and at L4-5 with interbody grafting.  There is also posterolateral bone graft present at each of these levels.  On the CT scan some of the images demonstrate some halo formation around the tops of the screws at L3 suggesting the possibility of a nonunion and, in fact, the interbody graft at L2-3 does not appear to be fully formed.  There is no mention of this made in the official report.  The L4-L5 vertebrae show no evidence of halo formation around the screws, but the degree to which the bone is actually healed is somewhat sparse.    PAST MEDICAL HISTORY:  [name] has generally been quite healthy.  She reports no significant medical problems.    MEDICATIONS:    Current medications  including some Osteo-Bi-Flex, Aleve, Estroblend, Alive vitamin supplement and Tru Biotics.    DRUG ALLERGIES:   SHE NOTES ALLERGIES TO ASPIRIN AND CODEINE BOTH OF WHICH CAUSE NAUSEA.    REVIEW OF SYSTEMS:   Systems review is notable for some history of glaucoma and arthritis.    PHYSICAL EXAMINATION:  On physical examination, she is alert and oriented.  She stands straight and erect and walks without any obvious antalgia and her motor strength appears good in the major groups including the iliopsoas, quad, tibialis anterior and gastrocs with preserved reflexes at 2+ in the patellae and the Achilles both.  Her back shows a well-healed midline incision extending from the region of L1 down to the sacrum.  There is no direct tenderness to palpation or percussion of her lumbar spine.    IMPRESSION:    The patient has evidence of chronic back pain status post a 2-level arthrodesis at L2-3 and L4-5.  She has had workup including a CT scan a year ago, but she has not had any subsequent studies that I can see.  She likely had some plain x-rays in Dr. Lorin Mercy' office.  I have suggested that it would be best to work her up with a myelogram and postmyelogram CAT scan.  This would give [place] the best information about any residual nerve root compromise and also  give [place] an opportunity to examine the stability and strength of her arthrodesis.  Based on these studies, we can make a decision as to whether any surgical intervention remains or whether we will need to focus on conservative efforts to help control her pain.  She does not appear to have been exposed to any epidural steroid injections or nerve root blocks which might give her some relief.  Before we proceed in that direction, I believe that an imaging study would be the appropriate way to work her up.     04/13/2012:  Jaimya returns to the office today.  I last visited with her the end of June and she was having increasing symptoms, and I reviewed her myelogram and  post-myelogram CT scan.  To discuss the nature and the severity of her stenosis at the level of L1-2 above her previous arthrodesis at L2-3 and L4-5, I noted that the most likely level that I expected problems, that is L3-4, is quite normal.  [name] has been having problems with what appears to be neurogenic claudication symptoms, that is pain radiating across her buttocks and into her thighs bilaterally. This limits her activity and her ability to tolerate any activity on her legs to any significant degree.  I noted that the stenosis may indeed cause these types of claudication symptoms and we had previously discussed considering surgical decompression of L1-2.  Listening to her symptoms again today in the office, I indeed conclude that the stenosis may be aggravating these symptoms.    Ultimately, the decision to proceed with surgery is one made by how the pain and dysfunction impacts her lifestyle.  If it is tolerable she may wish to continue conservatively.  However, if the pain is becoming increasingly intolerable or her level of activity is such that she cannot tolerate even activities of daily living, then I believe bilateral laminotomies at the L1-2 level is an appropriate consideration for this condition.    [name] has some concerns about the surgery, namely how long will she be disabled, when will she be able to do some significant lifting.  She notes to me that she has just had the birth of her grandchild and would like to be able to carry the child when appropriate. I indicated that for four weeks after surgery she should not do any substantial lifting. She may hold the baby certainly if it is put in her arms, but she needs to be mindful of her posture, sitting straight, walking straight, and standing straight to allow this process to heal.  Thereafter she should be able to function in a fairly unlimited fashion.  I noted to [name] that the surgery does not render her back new, but it does eliminate  this process of the stenosis which is compressing the nerves in her spinal canal and aggravating symptoms further downstream in her legs.    05/13/2012: System is admitted for surgery today. Her questions have been answered regarding the nature of surgery no changes are noted in her history or physical examination from previous documentations. Decompression at L1-L2 is being planned.

## 2012-05-13 NOTE — Progress Notes (Signed)
Transported via stretcher with monitor on room air Assisted up to bathrm without difficulty

## 2012-05-13 NOTE — Progress Notes (Signed)
Dr. Tobias Alexander and dr. Ellene Route at bedside

## 2012-05-13 NOTE — Progress Notes (Signed)
Subjective: Patient reports Offers no complaints  Objective: Vital signs in last 24 hours: Temp:  [97.8 F (36.6 C)] 97.8 F (36.6 C) (10/25 0938) Pulse Rate:  [40-68] 68  (10/25 1523) Resp:  [12-18] 18  (10/25 1530) BP: (141-177)/(61-83) 141/61 mmHg (10/25 1530) SpO2:  [97 %-99 %] 97 % (10/25 1530)  Intake/Output from previous day:   Intake/Output this shift: Total I/O In: 1400 [I.V.:1400] Out: 50 [Blood:50]  Incision is clean and dry motor function in lower extremities is grossly intact  Lab Results: No results found for this basename: WBC:2,HGB:2,HCT:2,PLT:2 in the last 72 hours BMET No results found for this basename: NA:2,K:2,CL:2,CO2:2,GLUCOSE:2,BUN:2,CREATININE:2,CALCIUM:2 in the last 72 hours  Studies/Results: No results found.  Assessment/Plan: Stable postop  LOS: 0 days  Mobilize next day or 2 for imminent discharge home   Senica Crall J 05/13/2012, 3:41 PM

## 2012-05-13 NOTE — Transfer of Care (Signed)
Immediate Anesthesia Transfer of Care Note  Patient: Melissa Maldonado  Procedure(s) Performed: Procedure(s) (LRB) with comments: LUMBAR LAMINECTOMY/DECOMPRESSION MICRODISCECTOMY 1 LEVEL (Bilateral) - Bilateral Lumbar one -two Decompressive Laminectomy  Patient Location: PACU  Anesthesia Type: General  Level of Consciousness: awake  Airway & Oxygen Therapy: Patient Spontanous Breathing and Patient connected to nasal cannula oxygen  Post-op Assessment: Report given to PACU RN, Post -op Vital signs reviewed and stable and Patient moving all extremities  Post vital signs: Reviewed and stable  Complications: No apparent anesthesia complications

## 2012-05-13 NOTE — Progress Notes (Signed)
Chaplain Wilber Oliphant called and referred pt. I found pt in short-stay waiting area. Her husband and best friends were bedside. After introductions, I had prayer w/pt and family. Pt and friends were very thankful.  Ernest Haber Chaplain

## 2012-05-13 NOTE — Anesthesia Preprocedure Evaluation (Addendum)
Anesthesia Evaluation  Patient identified by MRN, date of birth, ID band Patient awake    Reviewed: Allergy & Precautions, H&P , NPO status , Patient's Chart, lab work & pertinent test results, reviewed documented beta blocker date and time   History of Anesthesia Complications Negative for: history of anesthetic complications  Airway Mallampati: II TM Distance: >3 FB Neck ROM: Full    Dental No notable dental hx. (+) Teeth Intact and Dental Advisory Given   Pulmonary neg pulmonary ROS, shortness of breath and with exertion,    Pulmonary exam normal       Cardiovascular hypertension, Pt. on medications     Neuro/Psych  Headaches,    GI/Hepatic Neg liver ROS, GERD-  Controlled,  Endo/Other  negative endocrine ROS  Renal/GU negative Renal ROS     Musculoskeletal   Abdominal   Peds  Hematology negative hematology ROS (+)   Anesthesia Other Findings   Reproductive/Obstetrics                          Anesthesia Physical Anesthesia Plan  ASA: II  Anesthesia Plan: General   Post-op Pain Management:    Induction:   Airway Management Planned: Oral ETT  Additional Equipment:   Intra-op Plan:   Post-operative Plan: Extubation in OR  Informed Consent: I have reviewed the patients History and Physical, chart, labs and discussed the procedure including the risks, benefits and alternatives for the proposed anesthesia with the patient or authorized representative who has indicated his/her understanding and acceptance.   Dental advisory given  Plan Discussed with: CRNA, Anesthesiologist and Surgeon  Anesthesia Plan Comments:         Anesthesia Quick Evaluation

## 2012-05-13 NOTE — Anesthesia Postprocedure Evaluation (Signed)
Anesthesia Post Note  Patient: Melissa Maldonado  Procedure(s) Performed: Procedure(s) (LRB): LUMBAR LAMINECTOMY/DECOMPRESSION MICRODISCECTOMY 1 LEVEL (Bilateral)  Anesthesia type: general  Patient location: PACU  Post pain: Pain level controlled  Post assessment: Patient's Cardiovascular Status Stable  Last Vitals:  Filed Vitals:   05/13/12 1530  BP: 141/61  Pulse: 72  Temp:   Resp: 18    Post vital signs: Reviewed and stable  Level of consciousness: sedated  Complications: No apparent anesthesia complications

## 2012-05-13 NOTE — Progress Notes (Signed)
Dr Tobias Alexander called regarding patient's low heart rate down in 30's he suggests to be on a monitor over night. Dr. Ellene Route paged came by bedside and said amy order a telly bed

## 2012-05-13 NOTE — Progress Notes (Signed)
Pt denies pain just has sore throat taking ice chips without difficulty trying to clear throat . Instructed patient from ETT

## 2012-05-13 NOTE — Progress Notes (Signed)
Patient ID: Melissa Maldonado, female   DOB: 28-Mar-1941, 71 y.o.   MRN: [id number] Heart rate remains low 38-40 we'll place patient on telemetry and [place].

## 2012-05-13 NOTE — Preoperative (Signed)
Beta Blockers   Reason not to administer Beta Blockers:Not Applicable 

## 2012-05-13 NOTE — Op Note (Signed)
Preoperative diagnosis: L1-L2 spinal stenosis Postoperative diagnosis: L1-L2 spinal stenosis Procedure: Bilateral laminotomies L1-L2 decompression of the spinal stenosis Surgeon: [name] M.D. Assistant: Earle Gell M.D. Anesthesia: Gen. endotracheal Indications: Melissa Maldonado systems a 71 year old individual is had significant back pain with neurogenic claudication symptoms in both her lower extremities this is been getting progressively worse. She's undergone a fusion from L2-L3 and L4-L5 with implanted hardware this was done by Dr. Elta Guadeloupe 8 some years ago myelogram demonstrates that she has central canal stenosis at the level of L1-L2. She's been advised regarding the need for surgical decompression.  Procedure: Patient was brought to the operating room supine on a stretcher. After the smooth induction of general endotracheal anesthesia he was turned prone onto the operating table. The back was prepped with alcohol and DuraPrep and draped in a sterile fashion. Localizing radiographs identified the interspace at L1-L2. A midline incision was created and carried down to the lumbar dorsal fascia which was opened on either side of midline at this level. The dissection was carried out over the interlaminar space and the facet joints at L1-L2. A self-retaining retractor was placed in the wound. A high-speed drill was then used to remove the inferior margin of the lamina out to the medial wall the facet performing the initial portion of the dissection. The yellow ligament was then taken up and removed. Common dural tube was identified and dissection was carefully undertaken removing redundant yellow ligament and overgrown facet from the superior facet of L1-L2 and the laminar arch of L1. A foraminotomy was created over the L2 nerve root. Ball-tipped nerve hooks were then able to be inserted up and down the spinal canal to check the patency of the common dural tube.   Once an adequate decompression was identified and  secured, hemostasis and the soft tissues obtained meticulously and when verified retractor was removed the wound was irrigated copiously with antibiotic irrigating solution, and then the lumbar dorsal fascia was closed with #1 Vicryl in interrupted fashion. 20 cc Of half percent Marcaine was injected into the paraspinous fascia. 2-0 Vicryl was used to close the subcutaneous fascia and 3-0 Vicryl was used to close the subcuticular skin. Blood loss was estimated as 75 cc. The patient was returned to the recovery room in stable condition

## 2012-05-14 MED ORDER — DIAZEPAM 5 MG PO TABS: 5.0000 mg | ORAL_TABLET | Freq: Four times a day (QID) | ORAL | Status: DC | PRN

## 2012-05-14 MED ORDER — OXYCODONE-ACETAMINOPHEN 5-325 MG PO TABS: 1.0000 | ORAL_TABLET | ORAL | Status: DC | PRN

## 2012-05-14 NOTE — Evaluation (Signed)
Physical Therapy Evaluation Patient Details Name: Melissa Maldonado MRN: [id number] DOB: 1940-08-21 Today's Date: 05/14/2012 Time: 1036-1050 PT Time Calculation (min): 14 min  PT Assessment / Plan / Recommendation Clinical Impression  Pt s/p L1-2 lami/microdiscectomy. Pt is at her baseline functional mobility and is able to verbalize and maintain all back precautions throughout session. No further PT needs, will not follow    PT Assessment  Patent does not need any further PT services    Follow Up Recommendations  No PT follow up          Equipment Recommendations  None recommended by OT;None recommended by PT          Precautions / Restrictions Precautions Precautions: Back Precaution Booklet Issued: Yes (comment) Restrictions Weight Bearing Restrictions: No   Pertinent Vitals/Pain No pain      Mobility  Bed Mobility Bed Mobility: Rolling Left;Left Sidelying to Sit Rolling Left: 7: Independent Left Sidelying to Sit: HOB flat;7: Independent Transfers Transfers: Sit to Stand;Stand to Sit Sit to Stand: 7: Independent;From bed;From toilet Stand to Sit: 7: Independent;To toilet;To chair/3-in-1 Ambulation/Gait Ambulation/Gait Assistance: 7: Independent Ambulation Distance (Feet): 400 Feet Assistive device: None Ambulation/Gait Assistance Details: No deficits or cueing needed Gait Pattern: Within Functional Limits Stairs: Yes Stairs Assistance: 5: Supervision Stairs Assistance Details (indicate cue type and reason): No cueing needed. Supervision for safety Stair Management Technique: One rail Left;Step to pattern;Forwards Number of Stairs: 16         Visit Information  Last PT Received On: 05/14/12 Assistance Needed: +1 PT/OT Co-Evaluation/Treatment: Yes    Subjective Data  Patient Stated Goal: to go home now   Prior Functioning  Home Living Lives With: Spouse Available Help at Discharge: Available PRN/intermittently Type of Home: House Home Access:  Stairs to enter [employer] of Steps: 6 Entrance Stairs-Rails: Left;Right;Can reach both Home Layout: Two level Alternate Level Stairs-Number of Steps: 12 Alternate Level Stairs-Rails: Left Bathroom Shower/Tub: Tub/shower unit;Door;Curtain;Walk-in shower Bathroom Toilet: Handicapped height Home Adaptive Equipment: Walker - rolling;Bedside commode/3-in-1;Shower chair with back;Straight cane Prior Function Level of Independence: Independent Able to Take Stairs?: Yes Driving: Yes Vocation: Retired 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 Dominant Hand: Right    Cognition  Overall Cognitive Status: Appears within functional limits for tasks assessed/performed Arousal/Alertness: Awake/alert Orientation Level: Appears intact for tasks assessed Behavior During Session: [facility] for tasks performed    Extremity/Trunk Assessment Right Upper Extremity Assessment RUE ROM/Strength/Tone: [facility] for tasks assessed Left Upper Extremity Assessment LUE ROM/Strength/Tone: WFL for tasks assessed Right Lower Extremity Assessment RLE ROM/Strength/Tone: Within functional levels RLE Sensation: WFL - Light Touch Left Lower Extremity Assessment LLE ROM/Strength/Tone: Within functional levels LLE Sensation: WFL - Light Touch Trunk Assessment Trunk Assessment: Normal   Balance    End of Session PT - End of Session Equipment Utilized During Treatment: Gait belt Activity Tolerance: Patient tolerated treatment well Patient left: in chair;with call bell/phone within reach Nurse Communication: Mobility status   Ambrose Finland 05/14/2012, 11:31 AM  05/14/2012 Ambrose Finland DPT PAGER: [phone] OFFICE: [phone]

## 2012-05-14 NOTE — Discharge Summary (Signed)
  Physician Discharge Summary  Patient ID: EBONI BAMBRICK MRN: [id number] DOB/AGE: October 08, 1940 71 y.o.  Admit date: 05/13/2012 Discharge date: 05/14/2012  Admission Diagnoses: L1-2 spinal stenosis, lumbago, lumbar radiculopathy  Discharge Diagnoses: The same Active Problems:  * No active hospital problems. *    Discharged Condition: good  Hospital Course: Dr. Ellene Route admitted the patient to [facility] on 05/13/2012. On that day performed a bilateral L1-2 laminotomy foraminotomy.  The patient's postoperative course was unremarkable. On postop day #1 she requested discharge to home. The patient was given oral and written discharge instructions. All her questions were answered.  Consults: None Significant Diagnostic Studies: None Treatments: L1-2 laminotomy foraminotomy Discharge Exam: Blood pressure 101/54, pulse 97, temperature 97.8 F (36.6 C), temperature source Oral, resp. rate 18, height 5\' 5"  (1.651 m), weight 89.812 kg (198 lb), SpO2 95.00%. Patient is alert and oriented. Her strength is grossly normal in her lower extremities.  Disposition: Home  Discharge Orders    Future Orders Please Complete By Expires   Diet - low sodium heart healthy      Increase activity slowly      Discharge instructions      Comments:   Call [phone] for followup appointment.   Remove dressing in 48 hours      Call MD for:  temperature >100.4      Call MD for:  persistant nausea and vomiting      Call MD for:  severe uncontrolled pain      Call MD for:  redness, tenderness, or signs of infection (pain, swelling, redness, odor or green/yellow discharge around incision site)      Call MD for:  difficulty breathing, headache or visual disturbances      Call MD for:  hives      Call MD for:  persistant dizziness or light-headedness      Call MD for:  extreme fatigue          Medication List     As of 05/14/2012 10:05 AM    TAKE these medications         atorvastatin 40 MG  tablet   Commonly known as: LIPITOR   Take 40 mg by mouth daily.      co-enzyme Q-10 30 MG capsule   Take 30 mg by mouth daily.      diazepam 5 MG tablet   Commonly known as: VALIUM   Take 1 tablet (5 mg total) by mouth every 6 (six) hours as needed.      fish oil-omega-3 fatty acids 1000 MG capsule   Take 1 g by mouth daily.      losartan 25 MG tablet   Commonly known as: COZAAR   Take 25 mg by mouth daily.      meloxicam 7.5 MG tablet   Commonly known as: MOBIC   Take 7.5 mg by mouth daily.      multivitamin with minerals Tabs   Take 1 tablet by mouth daily.      oxyCODONE-acetaminophen 5-325 MG per tablet   Commonly known as: PERCOCET/ROXICET   Take 1-2 tablets by mouth every 4 (four) hours as needed.         SignedNewman Pies D 05/14/2012, 10:05 AM

## 2012-05-14 NOTE — Evaluation (Signed)
Occupational Therapy Evaluation Patient Details Name: Melissa Maldonado MRN: [id number] DOB: Dec 21, 1940 Today's Date: 05/14/2012 Time: [id number] OT Time Calculation (min): 16 min  OT Assessment / Plan / Recommendation Clinical Impression  Pt is recovering from back surgery.  Pt is modified independent in ADL and independent in mobility and knowledgeable in back precautions.  No futher OT needs.    OT Assessment  Patient does not need any further OT services    Follow Up Recommendations  No OT follow up    Barriers to Discharge      Equipment Recommendations  None recommended by OT    Recommendations for Other Services    Frequency       Precautions / Restrictions Precautions Precautions: Back Precaution Booklet Issued: Yes (comment) Restrictions Weight Bearing Restrictions: No   Pertinent Vitals/Pain No c/o pain    ADL  Eating/Feeding: Simulated;Independent Where Assessed - Eating/Feeding: Chair Grooming: Simulated;Independent Where Assessed - Grooming: Unsupported standing Upper Body Bathing: Simulated;Independent Where Assessed - Upper Body Bathing: Unsupported sitting Lower Body Bathing: Simulated;Modified independent Where Assessed - Lower Body Bathing: Unsupported sit to stand Upper Body Dressing: Performed;Independent Where Assessed - Upper Body Dressing: Unsupported sitting Lower Body Dressing: Performed;Modified independent Where Assessed - Lower Body Dressing: Unsupported sit to stand Toilet Transfer: Performed;Modified independent Toilet Transfer Method: Sit to Loss adjuster, chartered: Comfort height toilet Toileting - Clothing Manipulation and Hygiene: Simulated;Modified independent Where Assessed - Toileting Clothing Manipulation and Hygiene: Sit to stand from 3-in-1 or toilet Transfers/Ambulation Related to ADLs: Independent without device ADL Comments: Pt is knowledgeble in back precautions related to ADL.    OT Diagnosis:    OT Problem List:    OT Treatment Interventions:     OT Goals    Visit Information  Last OT Received On: 05/14/12 Assistance Needed: +1 PT/OT Co-Evaluation/Treatment: Yes    Subjective Data  Subjective: "I'm ready to go home." Patient Stated Goal: Return to PLOF.   Prior Functioning     Home Living Lives With: Spouse Available Help at Discharge: Available PRN/intermittently Type of Home: House Home Access: Stairs to enter [employer] of Steps: 6 Entrance Stairs-Rails: Left;Right;Can reach both Home Layout: Two level Alternate Level Stairs-Number of Steps: 12 Alternate Level Stairs-Rails: Left Bathroom Shower/Tub: Tub/shower unit;Door;Curtain;Walk-in shower Bathroom Toilet: Handicapped height Home Adaptive Equipment: Walker - rolling;Bedside commode/3-in-1;Shower chair with back;Straight cane Prior Function Level of Independence: Independent Able to Take Stairs?: Yes Driving: Yes Vocation: Retired 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 Dominant Hand: Right         Vision/Perception     Cognition  Overall Cognitive Status: Appears within functional limits for tasks assessed/performed Arousal/Alertness: Awake/alert Orientation Level: Appears intact for tasks assessed Behavior During Session: [facility] for tasks performed    Extremity/Trunk Assessment Right Upper Extremity Assessment RUE ROM/Strength/Tone: [facility] for tasks assessed Left Upper Extremity Assessment LUE ROM/Strength/Tone: WFL for tasks assessed Trunk Assessment Trunk Assessment: Normal     Mobility Bed Mobility Bed Mobility: Rolling Left;Left Sidelying to Sit Rolling Left: 7: Independent Left Sidelying to Sit: HOB flat;7: Independent Transfers Transfers: Sit to Stand;Stand to Sit Sit to Stand: 7: Independent;From bed;From toilet Stand to Sit: 7: Independent;To toilet;To chair/3-in-1     Shoulder Instructions     Exercise     Balance     End of Session OT - End of Session Activity Tolerance:  Patient tolerated treatment well Patient left: in chair;with call bell/phone within reach Nurse Communication: Mobility status;Other (comment) (HR )  GO  Malka So 05/14/2012, 11:01 AM [phone]

## 2012-05-16 ENCOUNTER — Encounter (HOSPITAL_COMMUNITY): Payer: Self-pay | Admitting: Neurological Surgery

## 2012-07-04 DIAGNOSIS — H2510 Age-related nuclear cataract, unspecified eye: Secondary | ICD-10-CM | POA: Diagnosis not present

## 2012-07-04 DIAGNOSIS — H43399 Other vitreous opacities, unspecified eye: Secondary | ICD-10-CM | POA: Diagnosis not present

## 2012-07-04 DIAGNOSIS — H40019 Open angle with borderline findings, low risk, unspecified eye: Secondary | ICD-10-CM | POA: Diagnosis not present

## 2012-07-06 DIAGNOSIS — M48061 Spinal stenosis, lumbar region without neurogenic claudication: Secondary | ICD-10-CM | POA: Diagnosis not present

## 2012-07-08 ENCOUNTER — Other Ambulatory Visit: Payer: Self-pay | Admitting: Neurological Surgery

## 2012-07-08 DIAGNOSIS — M48061 Spinal stenosis, lumbar region without neurogenic claudication: Secondary | ICD-10-CM

## 2012-07-18 ENCOUNTER — Ambulatory Visit
Admission: RE | Admit: 2012-07-18 | Discharge: 2012-07-18 | Disposition: A | Discharge status: Home / Self Care | Payer: Medicare Other | Source: Ambulatory Visit | Attending: Neurological Surgery | Admitting: Neurological Surgery

## 2012-07-18 DIAGNOSIS — M47817 Spondylosis without myelopathy or radiculopathy, lumbosacral region: Secondary | ICD-10-CM | POA: Diagnosis not present

## 2012-07-18 DIAGNOSIS — M48061 Spinal stenosis, lumbar region without neurogenic claudication: Secondary | ICD-10-CM

## 2012-07-18 MED ORDER — GADOBENATE DIMEGLUMINE 529 MG/ML IV SOLN
10.0000 mL | Freq: Once | INTRAVENOUS | Status: AC | PRN
  Administered 2012-07-18: 10 mL via INTRAVENOUS

## 2012-08-11 DIAGNOSIS — G2581 Restless legs syndrome: Secondary | ICD-10-CM | POA: Diagnosis not present

## 2012-08-11 DIAGNOSIS — M6530 Trigger finger, unspecified finger: Secondary | ICD-10-CM | POA: Diagnosis not present

## 2012-08-18 DIAGNOSIS — G2581 Restless legs syndrome: Secondary | ICD-10-CM | POA: Diagnosis not present

## 2012-09-08 DIAGNOSIS — M48061 Spinal stenosis, lumbar region without neurogenic claudication: Secondary | ICD-10-CM | POA: Diagnosis not present

## 2012-09-12 ENCOUNTER — Other Ambulatory Visit (HOSPITAL_COMMUNITY): Payer: Self-pay | Admitting: Neurological Surgery

## 2012-09-12 ENCOUNTER — Other Ambulatory Visit: Payer: Self-pay | Admitting: Neurological Surgery

## 2012-09-12 DIAGNOSIS — M48061 Spinal stenosis, lumbar region without neurogenic claudication: Secondary | ICD-10-CM

## 2012-09-19 DIAGNOSIS — H40019 Open angle with borderline findings, low risk, unspecified eye: Secondary | ICD-10-CM | POA: Diagnosis not present

## 2012-09-20 ENCOUNTER — Encounter (HOSPITAL_COMMUNITY): Payer: Self-pay

## 2012-09-21 ENCOUNTER — Ambulatory Visit (HOSPITAL_COMMUNITY)
Admission: RE | Admit: 2012-09-21 | Discharge: 2012-09-21 | Disposition: A | Discharge status: Home / Self Care | Payer: Medicare Other | Source: Ambulatory Visit | Attending: Neurological Surgery | Admitting: Neurological Surgery

## 2012-09-21 DIAGNOSIS — M48061 Spinal stenosis, lumbar region without neurogenic claudication: Secondary | ICD-10-CM

## 2012-09-21 DIAGNOSIS — Z981 Arthrodesis status: Secondary | ICD-10-CM | POA: Diagnosis not present

## 2012-09-21 DIAGNOSIS — M545 Low back pain, unspecified: Secondary | ICD-10-CM | POA: Insufficient documentation

## 2012-09-21 DIAGNOSIS — M5137 Other intervertebral disc degeneration, lumbosacral region: Secondary | ICD-10-CM | POA: Diagnosis not present

## 2012-09-21 DIAGNOSIS — M79609 Pain in unspecified limb: Secondary | ICD-10-CM | POA: Diagnosis not present

## 2012-09-21 DIAGNOSIS — M51379 Other intervertebral disc degeneration, lumbosacral region without mention of lumbar back pain or lower extremity pain: Secondary | ICD-10-CM | POA: Insufficient documentation

## 2012-09-21 DIAGNOSIS — IMO0002 Reserved for concepts with insufficient information to code with codable children: Secondary | ICD-10-CM | POA: Diagnosis not present

## 2012-09-21 MED ORDER — IOHEXOL 180 MG/ML  SOLN
20.0000 mL | Freq: Once | INTRAMUSCULAR | Status: AC | PRN
  Administered 2012-09-21: 14 mL via INTRATHECAL

## 2012-09-21 MED ORDER — DIAZEPAM 5 MG PO TABS
10.0000 mg | ORAL_TABLET | Freq: Once | ORAL | Status: AC
  Administered 2012-09-21: 10 mg via ORAL

## 2012-09-21 MED ORDER — DIAZEPAM 5 MG PO TABS
ORAL_TABLET | ORAL | Status: AC
  Filled 2012-09-21: qty 2

## 2012-09-21 MED ORDER — ONDANSETRON HCL 4 MG/2ML IJ SOLN: 4.0000 mg | Freq: Four times a day (QID) | INTRAMUSCULAR | Status: DC | PRN

## 2012-09-21 MED ORDER — HYDROCODONE-ACETAMINOPHEN 5-325 MG PO TABS: 1.0000 | ORAL_TABLET | ORAL | Status: DC | PRN

## 2012-09-21 NOTE — Procedures (Addendum)
  Melissa Maldonado  [id number] DOB:  1940/11/22 07/28/2012:    Melissa Maldonado returns to the office today. She has had her MRI of the lumbar spine completed on the 30th of December.  I had the opportunity to review it with her.  It demonstrates decompression at L1-2 very nicely with some fluid in the epidural space around the canal.  This does appear to cause some localized compression on the canal.  The fluid is unclear what it represents specifically but in my opinion likely represents a blood clot.  The other possibilities are that it could represent spinous fluid.    Clinically Melissa Maldonado does not have evidence for a spinal fluid leak in that though she had some mild headache it is not the typical type of headache that one experiences with chronic spinal fluid leak.  As regards infection, she has not been febrile at all.  She does have a considerable amount of pain and discomfort.  I would attribute this to be most consistent with a hematoma formation in the deep portion of the wound.  This will likely require to heal on its own.  In that regard, Melissa Maldonado continues to use Meloxicam as an anti-inflammatory and I feel this is an appropriate medication.    In reviewing her meds, I note that she has been on a statin medication in the form of atorvastatin.  She takes 10 mg. q.d.  I suggested that for at least a brief time that she stop this medication and see if it affects the nature of the pain and discomfort in her back.  If it does so, then she may need to consider addressing this and I would defer this to Dr. Shelia Media.  In the meantime, I want her to continue her nonsteroidal anti-inflammatory.  I believe that a further period of time should be allowed to allow this process to heal itself before we make any further investigation or intervention.  I am hopeful that her symptoms will resolve themselves with the passage of time.    Pre op Dx: Lumbar radicular pain status post arthrodesis and decompression of lumbar spine Post op Dx:  Lumbar radicular pain status post arthrodesis and decompression of lumbar spine Procedure: Lumbar myelogram Surgeon: Elsner Puncture level: L2-3 Fluid color: Clear colorless Injection: Iohexol 180 12 cc Findings: Widely appearing patent canal.

## 2012-10-05 DIAGNOSIS — IMO0002 Reserved for concepts with insufficient information to code with codable children: Secondary | ICD-10-CM | POA: Diagnosis not present

## 2012-10-14 DIAGNOSIS — M47817 Spondylosis without myelopathy or radiculopathy, lumbosacral region: Secondary | ICD-10-CM | POA: Diagnosis not present

## 2012-10-14 DIAGNOSIS — IMO0002 Reserved for concepts with insufficient information to code with codable children: Secondary | ICD-10-CM | POA: Diagnosis not present

## 2013-01-05 DIAGNOSIS — T7840XA Allergy, unspecified, initial encounter: Secondary | ICD-10-CM | POA: Diagnosis not present

## 2013-01-11 DIAGNOSIS — H40039 Anatomical narrow angle, unspecified eye: Secondary | ICD-10-CM | POA: Diagnosis not present

## 2013-01-18 DIAGNOSIS — Z1231 Encounter for screening mammogram for malignant neoplasm of breast: Secondary | ICD-10-CM | POA: Diagnosis not present

## 2013-02-10 DIAGNOSIS — H40039 Anatomical narrow angle, unspecified eye: Secondary | ICD-10-CM | POA: Diagnosis not present

## 2013-02-10 DIAGNOSIS — H2510 Age-related nuclear cataract, unspecified eye: Secondary | ICD-10-CM | POA: Diagnosis not present

## 2013-02-27 DIAGNOSIS — I10 Essential (primary) hypertension: Secondary | ICD-10-CM | POA: Diagnosis not present

## 2013-02-27 DIAGNOSIS — E7800 Pure hypercholesterolemia, unspecified: Secondary | ICD-10-CM | POA: Diagnosis not present

## 2013-02-27 DIAGNOSIS — Z0000 Encounter for general adult medical examination without abnormal findings: Secondary | ICD-10-CM | POA: Diagnosis not present

## 2013-03-03 DIAGNOSIS — I10 Essential (primary) hypertension: Secondary | ICD-10-CM | POA: Diagnosis not present

## 2013-03-03 DIAGNOSIS — M6530 Trigger finger, unspecified finger: Secondary | ICD-10-CM | POA: Diagnosis not present

## 2013-03-03 DIAGNOSIS — N951 Menopausal and female climacteric states: Secondary | ICD-10-CM | POA: Diagnosis not present

## 2013-03-03 DIAGNOSIS — E7800 Pure hypercholesterolemia, unspecified: Secondary | ICD-10-CM | POA: Diagnosis not present

## 2013-03-30 DIAGNOSIS — IMO0002 Reserved for concepts with insufficient information to code with codable children: Secondary | ICD-10-CM | POA: Diagnosis not present

## 2013-04-13 DIAGNOSIS — H40039 Anatomical narrow angle, unspecified eye: Secondary | ICD-10-CM | POA: Diagnosis not present

## 2013-04-14 DIAGNOSIS — G8929 Other chronic pain: Secondary | ICD-10-CM | POA: Diagnosis not present

## 2013-04-14 DIAGNOSIS — Z981 Arthrodesis status: Secondary | ICD-10-CM | POA: Diagnosis not present

## 2013-04-14 DIAGNOSIS — M545 Low back pain: Secondary | ICD-10-CM | POA: Diagnosis not present

## 2013-04-14 DIAGNOSIS — M4310 Spondylolisthesis, site unspecified: Secondary | ICD-10-CM | POA: Diagnosis not present

## 2013-04-14 DIAGNOSIS — IMO0002 Reserved for concepts with insufficient information to code with codable children: Secondary | ICD-10-CM | POA: Diagnosis not present

## 2013-05-15 ENCOUNTER — Ambulatory Visit (INDEPENDENT_AMBULATORY_CARE_PROVIDER_SITE_OTHER): Payer: Medicare Other | Admitting: Obstetrics & Gynecology

## 2013-05-15 ENCOUNTER — Encounter: Payer: Self-pay | Admitting: Obstetrics & Gynecology

## 2013-05-15 VITALS — Temp 97.8°F | Ht 65.0 in | Wt 205.8 lb

## 2013-05-15 DIAGNOSIS — Z01419 Encounter for gynecological examination (general) (routine) without abnormal findings: Secondary | ICD-10-CM | POA: Diagnosis not present

## 2013-05-15 NOTE — Patient Instructions (Signed)

## 2013-05-15 NOTE — Progress Notes (Signed)
.   Subjective:     Melissa Maldonado is a 72 y.o. female here for a routine exam.  No current complaints: .  Personal health questionnaire reviewed: no.   Gynecologic History No LMP recorded. Patient is postmenopausal. Contraception: none Last Pap:2012  Results were: normal Last mammogram: 2014. Results were: normal  Obstetric History OB History  No data available     The following portions of the patient's history were reviewed and updated as appropriate: allergies, current medications, past family history, past medical history, past social history, past surgical history and problem list.  Review of Systems Pertinent items are noted in HPI.    Objective:      General appearance: alert Breasts: normal appearance, no masses or tenderness Abdomen: soft, non-tender; bowel sounds normal; no masses,  no organomegaly Pelvic: cervix normal in appearance, external genitalia normal, no adnexal masses or tenderness, uterus normal size, shape, and consistency and vagina normal without discharge       Assessment:    Healthy female exam.    Plan:   Return prn

## 2013-06-28 DIAGNOSIS — M1710 Unilateral primary osteoarthritis, unspecified knee: Secondary | ICD-10-CM | POA: Diagnosis not present

## 2013-07-03 DIAGNOSIS — H43399 Other vitreous opacities, unspecified eye: Secondary | ICD-10-CM | POA: Diagnosis not present

## 2013-07-03 DIAGNOSIS — H2510 Age-related nuclear cataract, unspecified eye: Secondary | ICD-10-CM | POA: Diagnosis not present

## 2013-07-03 DIAGNOSIS — H40019 Open angle with borderline findings, low risk, unspecified eye: Secondary | ICD-10-CM | POA: Diagnosis not present

## 2013-07-06 DIAGNOSIS — I10 Essential (primary) hypertension: Secondary | ICD-10-CM | POA: Diagnosis not present

## 2013-07-06 DIAGNOSIS — IMO0002 Reserved for concepts with insufficient information to code with codable children: Secondary | ICD-10-CM | POA: Diagnosis not present

## 2013-07-06 DIAGNOSIS — E669 Obesity, unspecified: Secondary | ICD-10-CM | POA: Diagnosis not present

## 2013-07-07 ENCOUNTER — Other Ambulatory Visit (HOSPITAL_COMMUNITY): Payer: Self-pay | Admitting: Neurological Surgery

## 2013-07-07 DIAGNOSIS — M79604 Pain in right leg: Secondary | ICD-10-CM

## 2013-07-10 ENCOUNTER — Ambulatory Visit (HOSPITAL_COMMUNITY)
Admission: RE | Admit: 2013-07-10 | Discharge: 2013-07-10 | Disposition: A | Discharge status: Home / Self Care | Payer: Medicare Other | Source: Ambulatory Visit | Attending: Neurological Surgery | Admitting: Neurological Surgery

## 2013-07-10 DIAGNOSIS — M79609 Pain in unspecified limb: Secondary | ICD-10-CM

## 2013-07-10 DIAGNOSIS — M79604 Pain in right leg: Secondary | ICD-10-CM

## 2013-07-10 NOTE — Progress Notes (Signed)
VASCULAR LAB PRELIMINARY  PRELIMINARY  PRELIMINARY  PRELIMINARY  Bilateral lower extremity venous duplex completed.    Preliminary report:  Bilateral:  No evidence of DVT, superficial thrombosis, or Baker's Cyst.   Ronzell Laban, RVS 07/10/2013, 3:13 PM

## 2013-07-25 DIAGNOSIS — H2510 Age-related nuclear cataract, unspecified eye: Secondary | ICD-10-CM | POA: Diagnosis not present

## 2013-07-25 DIAGNOSIS — H269 Unspecified cataract: Secondary | ICD-10-CM | POA: Diagnosis not present

## 2013-08-10 DIAGNOSIS — IMO0002 Reserved for concepts with insufficient information to code with codable children: Secondary | ICD-10-CM | POA: Diagnosis not present

## 2013-08-10 DIAGNOSIS — E669 Obesity, unspecified: Secondary | ICD-10-CM | POA: Diagnosis not present

## 2013-08-15 DIAGNOSIS — H2510 Age-related nuclear cataract, unspecified eye: Secondary | ICD-10-CM | POA: Diagnosis not present

## 2013-08-21 DIAGNOSIS — M1710 Unilateral primary osteoarthritis, unspecified knee: Secondary | ICD-10-CM | POA: Diagnosis not present

## 2013-08-22 DIAGNOSIS — H2510 Age-related nuclear cataract, unspecified eye: Secondary | ICD-10-CM | POA: Diagnosis not present

## 2013-08-22 DIAGNOSIS — H269 Unspecified cataract: Secondary | ICD-10-CM | POA: Diagnosis not present

## 2013-08-26 DIAGNOSIS — M25569 Pain in unspecified knee: Secondary | ICD-10-CM | POA: Diagnosis not present

## 2013-08-28 ENCOUNTER — Other Ambulatory Visit: Payer: Self-pay | Admitting: Orthopedic Surgery

## 2013-08-28 DIAGNOSIS — M25562 Pain in left knee: Secondary | ICD-10-CM

## 2013-08-31 DIAGNOSIS — IMO0002 Reserved for concepts with insufficient information to code with codable children: Secondary | ICD-10-CM | POA: Diagnosis not present

## 2013-08-31 DIAGNOSIS — I10 Essential (primary) hypertension: Secondary | ICD-10-CM | POA: Diagnosis not present

## 2013-08-31 DIAGNOSIS — Z6829 Body mass index (BMI) 29.0-29.9, adult: Secondary | ICD-10-CM | POA: Diagnosis not present

## 2013-09-02 ENCOUNTER — Ambulatory Visit
Admission: RE | Admit: 2013-09-02 | Discharge: 2013-09-02 | Disposition: A | Discharge status: Home / Self Care | Payer: Medicare Other | Source: Ambulatory Visit | Attending: Orthopedic Surgery | Admitting: Orthopedic Surgery

## 2013-09-02 DIAGNOSIS — M25562 Pain in left knee: Secondary | ICD-10-CM

## 2013-09-02 DIAGNOSIS — IMO0002 Reserved for concepts with insufficient information to code with codable children: Secondary | ICD-10-CM | POA: Diagnosis not present

## 2013-09-05 ENCOUNTER — Other Ambulatory Visit: Payer: Self-pay | Admitting: Orthopedic Surgery

## 2013-09-05 DIAGNOSIS — M25569 Pain in unspecified knee: Secondary | ICD-10-CM | POA: Diagnosis not present

## 2013-09-06 ENCOUNTER — Encounter (HOSPITAL_BASED_OUTPATIENT_CLINIC_OR_DEPARTMENT_OTHER)
Admission: RE | Admit: 2013-09-06 | Discharge: 2013-09-06 | Disposition: A | Discharge status: Home / Self Care | Payer: Medicare Other | Source: Ambulatory Visit | Attending: Orthopedic Surgery | Admitting: Orthopedic Surgery

## 2013-09-06 ENCOUNTER — Encounter (HOSPITAL_BASED_OUTPATIENT_CLINIC_OR_DEPARTMENT_OTHER): Payer: Self-pay | Admitting: *Deleted

## 2013-09-06 ENCOUNTER — Other Ambulatory Visit: Payer: Self-pay

## 2013-09-06 DIAGNOSIS — IMO0002 Reserved for concepts with insufficient information to code with codable children: Secondary | ICD-10-CM | POA: Diagnosis not present

## 2013-09-06 DIAGNOSIS — Z01810 Encounter for preprocedural cardiovascular examination: Secondary | ICD-10-CM | POA: Diagnosis not present

## 2013-09-06 DIAGNOSIS — K219 Gastro-esophageal reflux disease without esophagitis: Secondary | ICD-10-CM | POA: Diagnosis not present

## 2013-09-06 DIAGNOSIS — E785 Hyperlipidemia, unspecified: Secondary | ICD-10-CM | POA: Diagnosis not present

## 2013-09-06 DIAGNOSIS — I10 Essential (primary) hypertension: Secondary | ICD-10-CM | POA: Diagnosis not present

## 2013-09-06 DIAGNOSIS — R0602 Shortness of breath: Secondary | ICD-10-CM | POA: Diagnosis not present

## 2013-09-06 DIAGNOSIS — I498 Other specified cardiac arrhythmias: Secondary | ICD-10-CM | POA: Diagnosis not present

## 2013-09-06 DIAGNOSIS — M48061 Spinal stenosis, lumbar region without neurogenic claudication: Secondary | ICD-10-CM | POA: Diagnosis not present

## 2013-09-06 DIAGNOSIS — H269 Unspecified cataract: Secondary | ICD-10-CM | POA: Diagnosis not present

## 2013-09-06 DIAGNOSIS — R51 Headache: Secondary | ICD-10-CM | POA: Diagnosis not present

## 2013-09-06 DIAGNOSIS — Z01812 Encounter for preprocedural laboratory examination: Secondary | ICD-10-CM | POA: Diagnosis not present

## 2013-09-06 LAB — BASIC METABOLIC PANEL
BUN: 19 mg/dL (ref 6–23)
CHLORIDE: 101 meq/L (ref 96–112)
CO2: 30 mEq/L (ref 19–32)
Calcium: 9.6 mg/dL (ref 8.4–10.5)
Creatinine, Ser: 1.29 mg/dL — ABNORMAL HIGH (ref 0.50–1.10)
GFR calc non Af Amer: 41 mL/min — ABNORMAL LOW (ref >90)
GFR, EST AFRICAN AMERICAN: 47 mL/min — AB (ref >90)
Glucose, Bld: 93 mg/dL (ref 70–99)
POTASSIUM: 4.1 meq/L (ref 3.7–5.3)
SODIUM: 140 meq/L (ref 137–147)

## 2013-09-06 NOTE — Progress Notes (Signed)
Pt will come in for [id number]

## 2013-09-08 ENCOUNTER — Ambulatory Visit (HOSPITAL_BASED_OUTPATIENT_CLINIC_OR_DEPARTMENT_OTHER)
Admission: RE | Admit: 2013-09-08 | Discharge: 2013-09-08 | Disposition: A | Discharge status: Home / Self Care | Payer: Medicare Other | Source: Ambulatory Visit | Attending: Orthopedic Surgery | Admitting: Orthopedic Surgery

## 2013-09-08 ENCOUNTER — Ambulatory Visit (HOSPITAL_BASED_OUTPATIENT_CLINIC_OR_DEPARTMENT_OTHER): Payer: Medicare Other | Admitting: Anesthesiology

## 2013-09-08 ENCOUNTER — Encounter (HOSPITAL_BASED_OUTPATIENT_CLINIC_OR_DEPARTMENT_OTHER): Payer: Medicare Other | Admitting: Anesthesiology

## 2013-09-08 ENCOUNTER — Encounter (HOSPITAL_BASED_OUTPATIENT_CLINIC_OR_DEPARTMENT_OTHER): Payer: Self-pay | Admitting: Anesthesiology

## 2013-09-08 ENCOUNTER — Encounter (HOSPITAL_BASED_OUTPATIENT_CLINIC_OR_DEPARTMENT_OTHER)
Admission: RE | Disposition: A | Discharge status: Home / Self Care | Payer: Self-pay | Source: Ambulatory Visit | Attending: Orthopedic Surgery

## 2013-09-08 DIAGNOSIS — Z01812 Encounter for preprocedural laboratory examination: Secondary | ICD-10-CM | POA: Insufficient documentation

## 2013-09-08 DIAGNOSIS — H269 Unspecified cataract: Secondary | ICD-10-CM | POA: Insufficient documentation

## 2013-09-08 DIAGNOSIS — I498 Other specified cardiac arrhythmias: Secondary | ICD-10-CM | POA: Insufficient documentation

## 2013-09-08 DIAGNOSIS — R0602 Shortness of breath: Secondary | ICD-10-CM | POA: Insufficient documentation

## 2013-09-08 DIAGNOSIS — S83242A Other tear of medial meniscus, current injury, left knee, initial encounter: Secondary | ICD-10-CM

## 2013-09-08 DIAGNOSIS — K219 Gastro-esophageal reflux disease without esophagitis: Secondary | ICD-10-CM | POA: Insufficient documentation

## 2013-09-08 DIAGNOSIS — R51 Headache: Secondary | ICD-10-CM | POA: Insufficient documentation

## 2013-09-08 DIAGNOSIS — I10 Essential (primary) hypertension: Secondary | ICD-10-CM | POA: Insufficient documentation

## 2013-09-08 DIAGNOSIS — IMO0002 Reserved for concepts with insufficient information to code with codable children: Secondary | ICD-10-CM | POA: Diagnosis not present

## 2013-09-08 DIAGNOSIS — X58XXXA Exposure to other specified factors, initial encounter: Secondary | ICD-10-CM | POA: Insufficient documentation

## 2013-09-08 DIAGNOSIS — M48061 Spinal stenosis, lumbar region without neurogenic claudication: Secondary | ICD-10-CM | POA: Insufficient documentation

## 2013-09-08 DIAGNOSIS — E785 Hyperlipidemia, unspecified: Secondary | ICD-10-CM | POA: Insufficient documentation

## 2013-09-08 DIAGNOSIS — Z01810 Encounter for preprocedural cardiovascular examination: Secondary | ICD-10-CM | POA: Insufficient documentation

## 2013-09-08 HISTORY — DX: Other tear of medial meniscus, current injury, left knee, initial encounter: S83.242A

## 2013-09-08 HISTORY — PX: KNEE ARTHROSCOPY WITH MEDIAL MENISECTOMY: SHX5651

## 2013-09-08 LAB — POCT HEMOGLOBIN-HEMACUE: Hemoglobin: 15 g/dL (ref 12.0–15.0)

## 2013-09-08 SURGERY — ARTHROSCOPY, KNEE, WITH MEDIAL MENISCECTOMY
Anesthesia: General | Site: Knee | Laterality: Left

## 2013-09-08 MED ORDER — PROPOFOL 10 MG/ML IV BOLUS
INTRAVENOUS | Status: DC | PRN
  Administered 2013-09-08: 180 mg via INTRAVENOUS
  Administered 2013-09-08: 20 mg via INTRAVENOUS

## 2013-09-08 MED ORDER — CEFAZOLIN SODIUM-DEXTROSE 2-3 GM-% IV SOLR: 2.0000 g | INTRAVENOUS | Status: DC

## 2013-09-08 MED ORDER — HYDROMORPHONE HCL PF 1 MG/ML IJ SOLN
0.2500 mg | INTRAMUSCULAR | Status: DC | PRN
  Administered 2013-09-08: 0.5 mg via INTRAVENOUS

## 2013-09-08 MED ORDER — ONDANSETRON HCL 4 MG PO TABS: 4.0000 mg | ORAL_TABLET | Freq: Three times a day (TID) | ORAL | Status: DC | PRN

## 2013-09-08 MED ORDER — FENTANYL CITRATE 0.05 MG/ML IJ SOLN
INTRAMUSCULAR | Status: DC | PRN
  Administered 2013-09-08 (×2): 25 ug via INTRAVENOUS

## 2013-09-08 MED ORDER — PHENYLEPHRINE HCL 10 MG/ML IJ SOLN
INTRAMUSCULAR | Status: DC | PRN
  Administered 2013-09-08: 50 ug via INTRAVENOUS

## 2013-09-08 MED ORDER — FENTANYL CITRATE 0.05 MG/ML IJ SOLN: 50.0000 ug | INTRAMUSCULAR | Status: DC | PRN

## 2013-09-08 MED ORDER — LACTATED RINGERS IV SOLN
INTRAVENOUS | Status: DC
  Administered 2013-09-08 (×2): via INTRAVENOUS

## 2013-09-08 MED ORDER — ATROPINE SULFATE 0.4 MG/ML IJ SOLN
INTRAMUSCULAR | Status: DC | PRN
  Administered 2013-09-08: .1 mg via INTRAVENOUS

## 2013-09-08 MED ORDER — OXYCODONE HCL 5 MG/5ML PO SOLN: 5.0000 mg | Freq: Once | ORAL | Status: DC | PRN

## 2013-09-08 MED ORDER — SODIUM CHLORIDE 0.9 % IR SOLN
Status: DC | PRN
  Administered 2013-09-08: 5000 mL

## 2013-09-08 MED ORDER — HYDROCODONE-ACETAMINOPHEN 10-325 MG PO TABS: 1.0000 | ORAL_TABLET | Freq: Four times a day (QID) | ORAL | Status: DC | PRN

## 2013-09-08 MED ORDER — DEXAMETHASONE SODIUM PHOSPHATE 4 MG/ML IJ SOLN
INTRAMUSCULAR | Status: DC | PRN
Start: 2013-09-08 — End: 2013-09-08
  Administered 2013-09-08: 10 mg via INTRAVENOUS

## 2013-09-08 MED ORDER — ONDANSETRON HCL 4 MG/2ML IJ SOLN: 4.0000 mg | Freq: Once | INTRAMUSCULAR | Status: DC | PRN

## 2013-09-08 MED ORDER — GLYCOPYRROLATE 0.2 MG/ML IJ SOLN
INTRAMUSCULAR | Status: DC | PRN
  Administered 2013-09-08 (×2): 0.2 mg via INTRAVENOUS

## 2013-09-08 MED ORDER — LIDOCAINE HCL (CARDIAC) 20 MG/ML IV SOLN
INTRAVENOUS | Status: DC | PRN
  Administered 2013-09-08: 40 mg via INTRAVENOUS

## 2013-09-08 MED ORDER — ONDANSETRON HCL 4 MG/2ML IJ SOLN
INTRAMUSCULAR | Status: DC | PRN
  Administered 2013-09-08: 4 mg via INTRAVENOUS

## 2013-09-08 MED ORDER — CEFAZOLIN SODIUM-DEXTROSE 2-3 GM-% IV SOLR
INTRAVENOUS | Status: AC
  Filled 2013-09-08: qty 50

## 2013-09-08 MED ORDER — FENTANYL CITRATE 0.05 MG/ML IJ SOLN
INTRAMUSCULAR | Status: AC
  Filled 2013-09-08: qty 4

## 2013-09-08 MED ORDER — MIDAZOLAM HCL 2 MG/2ML IJ SOLN: 1.0000 mg | INTRAMUSCULAR | Status: DC | PRN

## 2013-09-08 MED ORDER — SENNA-DOCUSATE SODIUM 8.6-50 MG PO TABS: 2.0000 | ORAL_TABLET | Freq: Every day | ORAL | Status: DC

## 2013-09-08 MED ORDER — HYDROMORPHONE HCL PF 1 MG/ML IJ SOLN
INTRAMUSCULAR | Status: AC
Start: 2013-09-08 — End: 2013-09-08
  Filled 2013-09-08: qty 1

## 2013-09-08 MED ORDER — BUPIVACAINE HCL (PF) 0.5 % IJ SOLN
INTRAMUSCULAR | Status: DC | PRN
  Administered 2013-09-08: 20 mL

## 2013-09-08 MED ORDER — BUPIVACAINE-EPINEPHRINE PF 0.5-1:200000 % IJ SOLN
INTRAMUSCULAR | Status: AC
  Filled 2013-09-08: qty 30

## 2013-09-08 MED ORDER — MIDAZOLAM HCL 5 MG/5ML IJ SOLN
INTRAMUSCULAR | Status: DC | PRN
  Administered 2013-09-08: 2 mg via INTRAVENOUS

## 2013-09-08 MED ORDER — OXYCODONE HCL 5 MG PO TABS: 5.0000 mg | ORAL_TABLET | Freq: Once | ORAL | Status: DC | PRN

## 2013-09-08 MED ORDER — MIDAZOLAM HCL 2 MG/2ML IJ SOLN
INTRAMUSCULAR | Status: AC
  Filled 2013-09-08: qty 2

## 2013-09-08 MED ORDER — BUPIVACAINE HCL (PF) 0.5 % IJ SOLN
INTRAMUSCULAR | Status: AC
  Filled 2013-09-08: qty 30

## 2013-09-08 SURGICAL SUPPLY — 47 items
APL SKNCLS STERI-STRIP NONHPOA (GAUZE/BANDAGES/DRESSINGS) ×1
BANDAGE ELASTIC 6 VELCRO ST LF (GAUZE/BANDAGES/DRESSINGS) ×3 IMPLANT
BANDAGE ESMARK 6X9 LF (GAUZE/BANDAGES/DRESSINGS) IMPLANT
BENZOIN TINCTURE PRP APPL 2/3 (GAUZE/BANDAGES/DRESSINGS) ×3 IMPLANT
BLADE CUTTER GATOR 3.5 (BLADE) ×3 IMPLANT
BNDG CMPR 9X6 STRL LF SNTH (GAUZE/BANDAGES/DRESSINGS)
BNDG ESMARK 6X9 LF (GAUZE/BANDAGES/DRESSINGS)
CANISTER SUCT 3000ML (MISCELLANEOUS) IMPLANT
CLOSURE WOUND 1/2 X4 (GAUZE/BANDAGES/DRESSINGS) ×1
CUFF TOURNIQUET SINGLE 34IN LL (TOURNIQUET CUFF) IMPLANT
CUTTER KNOT PUSHER 2-0 FIBERWI (INSTRUMENTS) IMPLANT
CUTTER MENISCUS  4.2MM (BLADE)
CUTTER MENISCUS 4.2MM (BLADE) IMPLANT
DRAPE ARTHROSCOPY W/POUCH 90 (DRAPES) ×3 IMPLANT
DURAPREP 26ML APPLICATOR (WOUND CARE) ×5 IMPLANT
ELECT MENISCUS 165MM 90D (ELECTRODE) IMPLANT
ELECT REM PT RETURN 9FT ADLT (ELECTROSURGICAL)
ELECTRODE REM PT RTRN 9FT ADLT (ELECTROSURGICAL) IMPLANT
GLOVE BIO SURGEON STRL SZ7 (GLOVE) ×2 IMPLANT
GLOVE BIO SURGEON STRL SZ8 (GLOVE) ×3 IMPLANT
GLOVE BIOGEL PI IND STRL 7.5 (GLOVE) IMPLANT
GLOVE BIOGEL PI IND STRL 8 (GLOVE) ×2 IMPLANT
GLOVE BIOGEL PI INDICATOR 7.5 (GLOVE) ×2
GLOVE BIOGEL PI INDICATOR 8 (GLOVE) ×4
GLOVE EXAM NITRILE MD LF STRL (GLOVE) ×4 IMPLANT
GLOVE ORTHO TXT STRL SZ7.5 (GLOVE) ×3 IMPLANT
GOWN STRL REUS W/ TWL LRG LVL3 (GOWN DISPOSABLE) ×1 IMPLANT
GOWN STRL REUS W/ TWL XL LVL3 (GOWN DISPOSABLE) ×4 IMPLANT
GOWN STRL REUS W/TWL LRG LVL3 (GOWN DISPOSABLE) ×3
GOWN STRL REUS W/TWL XL LVL3 (GOWN DISPOSABLE) ×6
HOLDER KNEE FOAM BLUE (MISCELLANEOUS) ×3 IMPLANT
IV NS IRRIG 3000ML ARTHROMATIC (IV SOLUTION) ×6 IMPLANT
KNEE WRAP E Z 3 GEL PACK (MISCELLANEOUS) ×3 IMPLANT
MANIFOLD NEPTUNE II (INSTRUMENTS) ×3 IMPLANT
PACK ARTHROSCOPY DSU (CUSTOM PROCEDURE TRAY) ×3 IMPLANT
PACK BASIN DAY SURGERY FS (CUSTOM PROCEDURE TRAY) ×3 IMPLANT
PENCIL BUTTON HOLSTER BLD 10FT (ELECTRODE) IMPLANT
SET ARTHROSCOPY TUBING (MISCELLANEOUS) ×3
SET ARTHROSCOPY TUBING LN (MISCELLANEOUS) ×1 IMPLANT
SLEEVE SCD COMPRESS KNEE MED (MISCELLANEOUS) ×2 IMPLANT
SPONGE GAUZE 4X4 12PLY (GAUZE/BANDAGES/DRESSINGS) ×3 IMPLANT
STRIP CLOSURE SKIN 1/2X4 (GAUZE/BANDAGES/DRESSINGS) ×2 IMPLANT
SUT MNCRL AB 4-0 PS2 18 (SUTURE) ×3 IMPLANT
TOWEL OR 17X24 6PK STRL BLUE (TOWEL DISPOSABLE) ×3 IMPLANT
TOWEL OR NON WOVEN STRL DISP B (DISPOSABLE) ×3 IMPLANT
WAND STAR VAC 90 (SURGICAL WAND) IMPLANT
WATER STERILE IRR 1000ML POUR (IV SOLUTION) ×3 IMPLANT

## 2013-09-08 NOTE — Anesthesia Postprocedure Evaluation (Signed)
  Anesthesia Post-op Note  Patient: Melissa Maldonado  Procedure(s) Performed: Procedure(s): LEFT KNEE ARTHROSCOPY WITH PARTIAL MEDIAL MENISCECTOMY (Left)  Patient Location: PACU  Anesthesia Type:General  Level of Consciousness: awake, alert  and oriented  Airway and Oxygen Therapy: Patient Spontanous Breathing  Post-op Pain: mild  Post-op Assessment: Post-op Vital signs reviewed, Patient's Cardiovascular Status Stable, Respiratory Function Stable, Patent Airway and Pain level controlled  Post-op Vital Signs: stable  Complications: No apparent anesthesia complications

## 2013-09-08 NOTE — Anesthesia Procedure Notes (Signed)
Procedure Name: LMA Insertion Date/Time: 09/08/2013 11:33 AM Performed by: Toula Moos Pre-anesthesia Checklist: Patient identified, Timeout performed, Emergency Drugs available, Suction available and Patient being monitored Patient Re-evaluated:Patient Re-evaluated prior to inductionOxygen Delivery Method: Circle system utilized Preoxygenation: Pre-oxygenation with 100% oxygen Intubation Type: IV induction Ventilation: Mask ventilation without difficulty LMA: LMA inserted LMA Size: 3.0 Number of attempts: 1 Airway Equipment and Method: Patient positioned with wedge pillow Placement Confirmation: positive ETCO2 and breath sounds checked- equal and bilateral Tube secured with: Tape Dental Injury: Teeth and Oropharynx as per pre-operative assessment

## 2013-09-08 NOTE — H&P (Signed)
PREOPERATIVE H&P  Chief Complaint: LEFT MEDIAL MENISCAL TEAR  HPI: Melissa Maldonado is a 73 y.o. female who presents for preoperative history and physical with a diagnosis of LEFT MEDIAL MENISCAL TEAR. Symptoms are rated as moderate to severe, and have been worsening.  This is significantly impairing activities of daily living.  She has elected for surgical management. She's had injections, difficulty tolerating anti-inflammatories due to reflux, and activity modification without relief.  Past Medical History  Diagnosis Date  . Hypertension     takes losartan daily  . Hyperlipidemia     takes Lipitor every other day  . Shortness of breath     with exertion  . Headache(784.0)     occasionally  . Arthritis   . Chronic back pain     lumbar stenosis  . GERD (gastroesophageal reflux disease)     occasionally but no meds required  . History of colon polyps   . History of blood transfusion   . Early cataracts, bilateral    Past Surgical History  Procedure Laterality Date  . Appendectomy    . Colonoscopy    . Lumbar laminectomy/decompression microdiscectomy  05/13/2012    Procedure: LUMBAR LAMINECTOMY/DECOMPRESSION MICRODISCECTOMY 1 LEVEL;  Surgeon: [name], MD;  Location: [facility];  Service: Neurosurgery;  Laterality: Bilateral;  Bilateral Lumbar one -two Decompressive Laminectomy  . Back surgery  2010    lumb fusion  . Upper gi endoscopy    . Eye surgery      both cataracts   History   Social History  . Marital Status: Married    Spouse Name: N/A    Number of Children: N/A  . Years of Education: N/A   Social History Main Topics  . Smoking status: Never Smoker   . Smokeless tobacco: None  . Alcohol Use: Yes     Comment: socially wine  . Drug Use: No  . Sexual Activity: Yes    Birth Control/ Protection: Post-menopausal   Other Topics Concern  . None   Social History Narrative  . None   History reviewed. No pertinent family history. Allergies  Allergen  Reactions  . Aspirin Other (See Comments)    "knocked out"  . Codeine Nausea Only   Prior to Admission medications   Medication Sig Start Date End Date Taking? Authorizing Provider  esomeprazole (NEXIUM) 40 MG capsule Take 40 mg by mouth as needed.    Yes Historical Provider, MD  losartan (COZAAR) 25 MG tablet Take 25 mg by mouth daily.   Yes Historical Provider, MD     Positive ROS: All other systems have been reviewed and were otherwise negative with the exception of those mentioned in the HPI and as above.  Physical Exam: General: Alert, no acute distress Cardiovascular: No pedal edema Respiratory: No cyanosis, no use of accessory musculature GI: No organomegaly, abdomen is soft and non-tender Skin: No lesions in the area of chief complaint Neurologic: Sensation intact distally Psychiatric: Patient is competent for consent with normal mood and affect Lymphatic: No axillary or cervical lymphadenopathy  MUSCULOSKELETAL: Left knee has positive medial joint line tenderness that is fairly exquisite. Range of motion from 0-125.  Assessment: LEFT MEDIAL MENISCAL TEAR  Plan: Plan for Procedure(s): LEFT KNEE ARTHROSCOPY WITH MEDIAL MENISCECTOMY  The risks benefits and alternatives were discussed with the patient including but not limited to the risks of nonoperative treatment, versus surgical intervention including infection, bleeding, nerve injury,  blood clots, cardiopulmonary complications, morbidity, mortality, among others, and they  were willing to proceed.   Melissa Bridge, MD Cell [phone]   09/08/2013 11:04 AM

## 2013-09-08 NOTE — Anesthesia Preprocedure Evaluation (Signed)
Anesthesia Evaluation  Patient identified by MRN, date of birth, ID band Patient awake    Reviewed: Allergy & Precautions, H&P , NPO status   Airway Mallampati: II TM Distance: >3 FB Neck ROM: Full    Dental  (+) Teeth Intact, Dental Advisory Given   Pulmonary  breath sounds clear to auscultation        Cardiovascular hypertension, Rate:Normal     Neuro/Psych    GI/Hepatic   Endo/Other    Renal/GU      Musculoskeletal   Abdominal   Peds  Hematology   Anesthesia Other Findings   Reproductive/Obstetrics                           Anesthesia Physical Anesthesia Plan  ASA: II  Anesthesia Plan: General   Post-op Pain Management:    Induction: Intravenous  Airway Management Planned: LMA  Additional Equipment:   Intra-op Plan:   Post-operative Plan: Extubation in OR  Informed Consent:   Dental advisory given  Plan Discussed with: CRNA and Anesthesiologist  Anesthesia Plan Comments:         Anesthesia Quick Evaluation

## 2013-09-08 NOTE — Op Note (Signed)
09/08/2013  12:34 PM  PATIENT:  Melissa Maldonado    PRE-OPERATIVE DIAGNOSIS:  LEFT MEDIAL MENISCAL TEAR  POST-OPERATIVE DIAGNOSIS:  Same  PROCEDURE:  LEFT KNEE ARTHROSCOPY WITH PARTIAL MEDIAL MENISCECTOMY  SURGEON:  [name], MD  PHYSICIAN ASSISTANT: Joya Gaskins, OPA-C, present and scrubbed throughout the case, critical for completion in a timely fashion, and for retraction, instrumentation, and closure.  ANESTHESIA:   General  PREOPERATIVE INDICATIONS:  Melissa Maldonado is a  72 y.o. female with a diagnosis of LEFT MEDIAL MENISCAL TEAR who failed conservative measures and elected for surgical management.    The risks benefits and alternatives were discussed with the patient preoperatively including but not limited to the risks of infection, bleeding, nerve injury, cardiopulmonary complications, the need for revision surgery, among others, and the patient was willing to proceed.  OPERATIVE IMPLANTS: None  OPERATIVE FINDINGS: Extensive grade 2 changes on the femoral trochlea, with one small area of 1 cm x 0.5 cm grade 3, nearly grade 4 lesion on the lateral most aspect of the distal portion of the femoral trochlea. The patella had grade 2 changes. The medial compartment had grade 1 changes with some grade 2 changes on the femur. The lateral compartment was relatively well preserved, although there was some grade 1 changes on the tibia. The medial meniscus had a radial tear that extended back to the, and destabilize the entirety of the meniscus.  OPERATIVE PROCEDURE: The patient is brought to the operating room and placed in the supine position. General anesthesia was administered. IV antibiotics were given. The left lower extremity was prepped and draped in usual sterile fashion. She did have an episode of bradycardia upon induction, which responded to atropine and IV fluids.  We then proceeded with the case, and had 2 anterior portals. The arthroscopic shaver and the arthroscopic  biter was used to debride the meniscus back to a stable configuration. I began tapering the meniscus about the junction of the posterior third and the middle third, and then tapering this back to the level of the capsule at the lateral most aspect of the posterior horn of the medial meniscus.  The shaver was used to smooth the contours, and I also used the shaver to perform a light chondroplasty of the loose flap of chondral tissue that was seen at the distal lateral aspect of the femoral trochlea.  Her anterior cruciate ligament and PCL was intact.  The knee was drained, the portals closed with Monocryl followed by Steri-Strips and sterile gauze. She was awakened and returned to the PACU in stable and satisfactory condition. There were no complications and she tolerated the procedure well.

## 2013-09-08 NOTE — Transfer of Care (Signed)
Immediate Anesthesia Transfer of Care Note  Patient: Melissa Maldonado  Procedure(s) Performed: Procedure(s): LEFT KNEE ARTHROSCOPY WITH PARTIAL MEDIAL MENISCECTOMY (Left)  Patient Location: PACU  Anesthesia Type:General  Level of Consciousness: awake, alert  and oriented  Airway & Oxygen Therapy: Patient Spontanous Breathing and Patient connected to face mask oxygen  Post-op Assessment: Report given to PACU RN and Post -op Vital signs reviewed and stable  Post vital signs: Reviewed and stable  Complications: No apparent anesthesia complications

## 2013-09-08 NOTE — Discharge Instructions (Signed)
Diet: As you were doing prior to hospitalization   Shower:  May shower but keep the wounds dry, use an occlusive plastic wrap, NO SOAKING IN TUB.  If the bandage gets wet, change with a clean dry gauze.  Dressing:  You may change your dressing 3-5 days after surgery.  Then change the dressing daily with sterile gauze dressing.    There are sticky tapes (steri-strips) on your wounds and all the stitches are absorbable.  Leave the steri-strips in place when changing your dressings, they will peel off with time, usually 2-3 weeks.  Activity:  Increase activity slowly as tolerated, but follow the weight bearing instructions below.  No lifting or driving for 6 weeks.  Weight Bearing:   As tolerated.    To prevent constipation: you may use a stool softener such as -  Colace (over the counter) 100 mg by mouth twice a day  Drink plenty of fluids (prune juice may be helpful) and high fiber foods Miralax (over the counter) for constipation as needed.    Itching:  If you experience itching with your medications, try taking only a single pain pill, or even half a pain pill at a time.  You may take up to 10 pain pills per day, and you can also use benadryl over the counter for itching or also to help with sleep.   Precautions:  If you experience chest pain or shortness of breath - call 911 immediately for transfer to the hospital emergency department!!  If you develop a fever greater that 101 F, purulent drainage from wound, increased redness or drainage from wound, or calf pain -- Call the office at [phone]                                                Follow- Up Appointment:  Please call for an appointment to be seen in 2 weeks [place] - [phone]      Post Anesthesia Home Care Instructions  Activity: Get plenty of rest for the remainder of the day. A responsible adult should stay with you for 24 hours following the procedure.  For the next 24 hours, DO NOT: -Drive a car -Conservation officer, nature -Drink alcoholic beverages -Take any medication unless instructed by your physician -Make any legal decisions or sign important papers.  Meals: Start with liquid foods such as gelatin or soup. Progress to regular foods as tolerated. Avoid greasy, spicy, heavy foods. If nausea and/or vomiting occur, drink only clear liquids until the nausea and/or vomiting subsides. Call your physician if vomiting continues.  Special Instructions/Symptoms: Your throat may feel dry or sore from the anesthesia or the breathing tube placed in your throat during surgery. If this causes discomfort, gargle with warm salt water. The discomfort should disappear within 24 hours.

## 2013-09-09 ENCOUNTER — Encounter: Payer: Self-pay | Admitting: Obstetrics & Gynecology

## 2013-09-11 ENCOUNTER — Encounter (HOSPITAL_BASED_OUTPATIENT_CLINIC_OR_DEPARTMENT_OTHER): Payer: Self-pay | Admitting: Orthopedic Surgery

## 2013-11-20 DIAGNOSIS — M25569 Pain in unspecified knee: Secondary | ICD-10-CM | POA: Diagnosis not present

## 2013-12-13 DIAGNOSIS — M25569 Pain in unspecified knee: Secondary | ICD-10-CM | POA: Diagnosis not present

## 2013-12-13 DIAGNOSIS — M1710 Unilateral primary osteoarthritis, unspecified knee: Secondary | ICD-10-CM | POA: Diagnosis not present

## 2013-12-13 DIAGNOSIS — R262 Difficulty in walking, not elsewhere classified: Secondary | ICD-10-CM | POA: Diagnosis not present

## 2013-12-13 DIAGNOSIS — IMO0002 Reserved for concepts with insufficient information to code with codable children: Secondary | ICD-10-CM | POA: Diagnosis not present

## 2013-12-14 DIAGNOSIS — IMO0002 Reserved for concepts with insufficient information to code with codable children: Secondary | ICD-10-CM | POA: Diagnosis not present

## 2013-12-14 DIAGNOSIS — R262 Difficulty in walking, not elsewhere classified: Secondary | ICD-10-CM | POA: Diagnosis not present

## 2013-12-14 DIAGNOSIS — M25569 Pain in unspecified knee: Secondary | ICD-10-CM | POA: Diagnosis not present

## 2013-12-14 DIAGNOSIS — M1710 Unilateral primary osteoarthritis, unspecified knee: Secondary | ICD-10-CM | POA: Diagnosis not present

## 2013-12-18 DIAGNOSIS — M1710 Unilateral primary osteoarthritis, unspecified knee: Secondary | ICD-10-CM | POA: Diagnosis not present

## 2013-12-19 DIAGNOSIS — M25569 Pain in unspecified knee: Secondary | ICD-10-CM | POA: Diagnosis not present

## 2013-12-19 DIAGNOSIS — M1710 Unilateral primary osteoarthritis, unspecified knee: Secondary | ICD-10-CM | POA: Diagnosis not present

## 2013-12-19 DIAGNOSIS — R262 Difficulty in walking, not elsewhere classified: Secondary | ICD-10-CM | POA: Diagnosis not present

## 2013-12-19 DIAGNOSIS — IMO0002 Reserved for concepts with insufficient information to code with codable children: Secondary | ICD-10-CM | POA: Diagnosis not present

## 2013-12-21 DIAGNOSIS — R262 Difficulty in walking, not elsewhere classified: Secondary | ICD-10-CM | POA: Diagnosis not present

## 2013-12-21 DIAGNOSIS — M25569 Pain in unspecified knee: Secondary | ICD-10-CM | POA: Diagnosis not present

## 2013-12-21 DIAGNOSIS — IMO0002 Reserved for concepts with insufficient information to code with codable children: Secondary | ICD-10-CM | POA: Diagnosis not present

## 2013-12-21 DIAGNOSIS — M1710 Unilateral primary osteoarthritis, unspecified knee: Secondary | ICD-10-CM | POA: Diagnosis not present

## 2013-12-26 DIAGNOSIS — IMO0002 Reserved for concepts with insufficient information to code with codable children: Secondary | ICD-10-CM | POA: Diagnosis not present

## 2013-12-26 DIAGNOSIS — M1710 Unilateral primary osteoarthritis, unspecified knee: Secondary | ICD-10-CM | POA: Diagnosis not present

## 2013-12-26 DIAGNOSIS — R262 Difficulty in walking, not elsewhere classified: Secondary | ICD-10-CM | POA: Diagnosis not present

## 2013-12-26 DIAGNOSIS — M25569 Pain in unspecified knee: Secondary | ICD-10-CM | POA: Diagnosis not present

## 2013-12-28 DIAGNOSIS — R262 Difficulty in walking, not elsewhere classified: Secondary | ICD-10-CM | POA: Diagnosis not present

## 2013-12-28 DIAGNOSIS — M25569 Pain in unspecified knee: Secondary | ICD-10-CM | POA: Diagnosis not present

## 2013-12-28 DIAGNOSIS — M7500 Adhesive capsulitis of unspecified shoulder: Secondary | ICD-10-CM | POA: Diagnosis not present

## 2013-12-28 DIAGNOSIS — M1710 Unilateral primary osteoarthritis, unspecified knee: Secondary | ICD-10-CM | POA: Diagnosis not present

## 2014-01-02 DIAGNOSIS — M1710 Unilateral primary osteoarthritis, unspecified knee: Secondary | ICD-10-CM | POA: Diagnosis not present

## 2014-01-02 DIAGNOSIS — R262 Difficulty in walking, not elsewhere classified: Secondary | ICD-10-CM | POA: Diagnosis not present

## 2014-01-02 DIAGNOSIS — IMO0002 Reserved for concepts with insufficient information to code with codable children: Secondary | ICD-10-CM | POA: Diagnosis not present

## 2014-01-02 DIAGNOSIS — M25569 Pain in unspecified knee: Secondary | ICD-10-CM | POA: Diagnosis not present

## 2014-01-04 DIAGNOSIS — IMO0002 Reserved for concepts with insufficient information to code with codable children: Secondary | ICD-10-CM | POA: Diagnosis not present

## 2014-01-04 DIAGNOSIS — M1710 Unilateral primary osteoarthritis, unspecified knee: Secondary | ICD-10-CM | POA: Diagnosis not present

## 2014-01-04 DIAGNOSIS — R262 Difficulty in walking, not elsewhere classified: Secondary | ICD-10-CM | POA: Diagnosis not present

## 2014-01-04 DIAGNOSIS — M25569 Pain in unspecified knee: Secondary | ICD-10-CM | POA: Diagnosis not present

## 2014-01-09 DIAGNOSIS — IMO0002 Reserved for concepts with insufficient information to code with codable children: Secondary | ICD-10-CM | POA: Diagnosis not present

## 2014-01-09 DIAGNOSIS — M25569 Pain in unspecified knee: Secondary | ICD-10-CM | POA: Diagnosis not present

## 2014-01-09 DIAGNOSIS — M1710 Unilateral primary osteoarthritis, unspecified knee: Secondary | ICD-10-CM | POA: Diagnosis not present

## 2014-01-09 DIAGNOSIS — R262 Difficulty in walking, not elsewhere classified: Secondary | ICD-10-CM | POA: Diagnosis not present

## 2014-01-11 DIAGNOSIS — M25569 Pain in unspecified knee: Secondary | ICD-10-CM | POA: Diagnosis not present

## 2014-01-11 DIAGNOSIS — IMO0002 Reserved for concepts with insufficient information to code with codable children: Secondary | ICD-10-CM | POA: Diagnosis not present

## 2014-01-11 DIAGNOSIS — M1710 Unilateral primary osteoarthritis, unspecified knee: Secondary | ICD-10-CM | POA: Diagnosis not present

## 2014-01-11 DIAGNOSIS — R262 Difficulty in walking, not elsewhere classified: Secondary | ICD-10-CM | POA: Diagnosis not present

## 2014-01-15 DIAGNOSIS — IMO0002 Reserved for concepts with insufficient information to code with codable children: Secondary | ICD-10-CM | POA: Diagnosis not present

## 2014-01-16 DIAGNOSIS — M1710 Unilateral primary osteoarthritis, unspecified knee: Secondary | ICD-10-CM | POA: Diagnosis not present

## 2014-01-16 DIAGNOSIS — R262 Difficulty in walking, not elsewhere classified: Secondary | ICD-10-CM | POA: Diagnosis not present

## 2014-01-16 DIAGNOSIS — IMO0002 Reserved for concepts with insufficient information to code with codable children: Secondary | ICD-10-CM | POA: Diagnosis not present

## 2014-01-16 DIAGNOSIS — M25569 Pain in unspecified knee: Secondary | ICD-10-CM | POA: Diagnosis not present

## 2014-01-18 DIAGNOSIS — M25569 Pain in unspecified knee: Secondary | ICD-10-CM | POA: Diagnosis not present

## 2014-01-18 DIAGNOSIS — R262 Difficulty in walking, not elsewhere classified: Secondary | ICD-10-CM | POA: Diagnosis not present

## 2014-01-18 DIAGNOSIS — IMO0002 Reserved for concepts with insufficient information to code with codable children: Secondary | ICD-10-CM | POA: Diagnosis not present

## 2014-01-18 DIAGNOSIS — M1710 Unilateral primary osteoarthritis, unspecified knee: Secondary | ICD-10-CM | POA: Diagnosis not present

## 2014-01-22 DIAGNOSIS — Z1231 Encounter for screening mammogram for malignant neoplasm of breast: Secondary | ICD-10-CM | POA: Diagnosis not present

## 2014-01-25 DIAGNOSIS — R928 Other abnormal and inconclusive findings on diagnostic imaging of breast: Secondary | ICD-10-CM | POA: Diagnosis not present

## 2014-01-25 DIAGNOSIS — N6009 Solitary cyst of unspecified breast: Secondary | ICD-10-CM | POA: Diagnosis not present

## 2014-02-08 DIAGNOSIS — H40039 Anatomical narrow angle, unspecified eye: Secondary | ICD-10-CM | POA: Diagnosis not present

## 2014-02-08 DIAGNOSIS — Z961 Presence of intraocular lens: Secondary | ICD-10-CM | POA: Diagnosis not present

## 2014-03-07 DIAGNOSIS — M999 Biomechanical lesion, unspecified: Secondary | ICD-10-CM | POA: Diagnosis not present

## 2014-03-07 DIAGNOSIS — M545 Low back pain, unspecified: Secondary | ICD-10-CM | POA: Diagnosis not present

## 2014-03-07 DIAGNOSIS — M25659 Stiffness of unspecified hip, not elsewhere classified: Secondary | ICD-10-CM | POA: Diagnosis not present

## 2014-03-07 DIAGNOSIS — IMO0002 Reserved for concepts with insufficient information to code with codable children: Secondary | ICD-10-CM | POA: Diagnosis not present

## 2014-03-09 DIAGNOSIS — M999 Biomechanical lesion, unspecified: Secondary | ICD-10-CM | POA: Diagnosis not present

## 2014-03-09 DIAGNOSIS — M545 Low back pain, unspecified: Secondary | ICD-10-CM | POA: Diagnosis not present

## 2014-03-09 DIAGNOSIS — M25659 Stiffness of unspecified hip, not elsewhere classified: Secondary | ICD-10-CM | POA: Diagnosis not present

## 2014-03-09 DIAGNOSIS — IMO0002 Reserved for concepts with insufficient information to code with codable children: Secondary | ICD-10-CM | POA: Diagnosis not present

## 2014-03-12 DIAGNOSIS — M25659 Stiffness of unspecified hip, not elsewhere classified: Secondary | ICD-10-CM | POA: Diagnosis not present

## 2014-03-12 DIAGNOSIS — M545 Low back pain, unspecified: Secondary | ICD-10-CM | POA: Diagnosis not present

## 2014-03-12 DIAGNOSIS — IMO0002 Reserved for concepts with insufficient information to code with codable children: Secondary | ICD-10-CM | POA: Diagnosis not present

## 2014-03-12 DIAGNOSIS — M999 Biomechanical lesion, unspecified: Secondary | ICD-10-CM | POA: Diagnosis not present

## 2014-03-13 DIAGNOSIS — M25659 Stiffness of unspecified hip, not elsewhere classified: Secondary | ICD-10-CM | POA: Diagnosis not present

## 2014-03-13 DIAGNOSIS — IMO0002 Reserved for concepts with insufficient information to code with codable children: Secondary | ICD-10-CM | POA: Diagnosis not present

## 2014-03-13 DIAGNOSIS — M545 Low back pain, unspecified: Secondary | ICD-10-CM | POA: Diagnosis not present

## 2014-03-13 DIAGNOSIS — M999 Biomechanical lesion, unspecified: Secondary | ICD-10-CM | POA: Diagnosis not present

## 2014-03-15 DIAGNOSIS — M545 Low back pain, unspecified: Secondary | ICD-10-CM | POA: Diagnosis not present

## 2014-03-15 DIAGNOSIS — M25659 Stiffness of unspecified hip, not elsewhere classified: Secondary | ICD-10-CM | POA: Diagnosis not present

## 2014-03-15 DIAGNOSIS — M999 Biomechanical lesion, unspecified: Secondary | ICD-10-CM | POA: Diagnosis not present

## 2014-03-15 DIAGNOSIS — IMO0002 Reserved for concepts with insufficient information to code with codable children: Secondary | ICD-10-CM | POA: Diagnosis not present

## 2014-03-19 DIAGNOSIS — M25659 Stiffness of unspecified hip, not elsewhere classified: Secondary | ICD-10-CM | POA: Diagnosis not present

## 2014-03-19 DIAGNOSIS — M999 Biomechanical lesion, unspecified: Secondary | ICD-10-CM | POA: Diagnosis not present

## 2014-03-19 DIAGNOSIS — E7800 Pure hypercholesterolemia, unspecified: Secondary | ICD-10-CM | POA: Diagnosis not present

## 2014-03-19 DIAGNOSIS — I10 Essential (primary) hypertension: Secondary | ICD-10-CM | POA: Diagnosis not present

## 2014-03-19 DIAGNOSIS — M545 Low back pain, unspecified: Secondary | ICD-10-CM | POA: Diagnosis not present

## 2014-03-19 DIAGNOSIS — Z0000 Encounter for general adult medical examination without abnormal findings: Secondary | ICD-10-CM | POA: Diagnosis not present

## 2014-03-19 DIAGNOSIS — Z780 Asymptomatic menopausal state: Secondary | ICD-10-CM | POA: Diagnosis not present

## 2014-03-19 DIAGNOSIS — Z23 Encounter for immunization: Secondary | ICD-10-CM | POA: Diagnosis not present

## 2014-03-19 DIAGNOSIS — IMO0002 Reserved for concepts with insufficient information to code with codable children: Secondary | ICD-10-CM | POA: Diagnosis not present

## 2014-03-20 DIAGNOSIS — M545 Low back pain, unspecified: Secondary | ICD-10-CM | POA: Diagnosis not present

## 2014-03-20 DIAGNOSIS — IMO0002 Reserved for concepts with insufficient information to code with codable children: Secondary | ICD-10-CM | POA: Diagnosis not present

## 2014-03-20 DIAGNOSIS — M25659 Stiffness of unspecified hip, not elsewhere classified: Secondary | ICD-10-CM | POA: Diagnosis not present

## 2014-03-20 DIAGNOSIS — M999 Biomechanical lesion, unspecified: Secondary | ICD-10-CM | POA: Diagnosis not present

## 2014-03-22 DIAGNOSIS — IMO0002 Reserved for concepts with insufficient information to code with codable children: Secondary | ICD-10-CM | POA: Diagnosis not present

## 2014-03-22 DIAGNOSIS — M545 Low back pain, unspecified: Secondary | ICD-10-CM | POA: Diagnosis not present

## 2014-03-22 DIAGNOSIS — M25659 Stiffness of unspecified hip, not elsewhere classified: Secondary | ICD-10-CM | POA: Diagnosis not present

## 2014-03-22 DIAGNOSIS — M999 Biomechanical lesion, unspecified: Secondary | ICD-10-CM | POA: Diagnosis not present

## 2014-03-27 DIAGNOSIS — Z0000 Encounter for general adult medical examination without abnormal findings: Secondary | ICD-10-CM | POA: Diagnosis not present

## 2014-03-27 DIAGNOSIS — M545 Low back pain, unspecified: Secondary | ICD-10-CM | POA: Diagnosis not present

## 2014-03-27 DIAGNOSIS — IMO0002 Reserved for concepts with insufficient information to code with codable children: Secondary | ICD-10-CM | POA: Diagnosis not present

## 2014-03-27 DIAGNOSIS — Z780 Asymptomatic menopausal state: Secondary | ICD-10-CM | POA: Diagnosis not present

## 2014-03-27 DIAGNOSIS — M999 Biomechanical lesion, unspecified: Secondary | ICD-10-CM | POA: Diagnosis not present

## 2014-03-27 DIAGNOSIS — I10 Essential (primary) hypertension: Secondary | ICD-10-CM | POA: Diagnosis not present

## 2014-03-27 DIAGNOSIS — M25659 Stiffness of unspecified hip, not elsewhere classified: Secondary | ICD-10-CM | POA: Diagnosis not present

## 2014-03-27 DIAGNOSIS — G2581 Restless legs syndrome: Secondary | ICD-10-CM | POA: Diagnosis not present

## 2014-03-27 DIAGNOSIS — E7800 Pure hypercholesterolemia, unspecified: Secondary | ICD-10-CM | POA: Diagnosis not present

## 2014-03-28 DIAGNOSIS — IMO0002 Reserved for concepts with insufficient information to code with codable children: Secondary | ICD-10-CM | POA: Diagnosis not present

## 2014-03-28 DIAGNOSIS — M25659 Stiffness of unspecified hip, not elsewhere classified: Secondary | ICD-10-CM | POA: Diagnosis not present

## 2014-03-28 DIAGNOSIS — M545 Low back pain, unspecified: Secondary | ICD-10-CM | POA: Diagnosis not present

## 2014-03-28 DIAGNOSIS — M999 Biomechanical lesion, unspecified: Secondary | ICD-10-CM | POA: Diagnosis not present

## 2014-03-30 DIAGNOSIS — M545 Low back pain, unspecified: Secondary | ICD-10-CM | POA: Diagnosis not present

## 2014-03-30 DIAGNOSIS — M25659 Stiffness of unspecified hip, not elsewhere classified: Secondary | ICD-10-CM | POA: Diagnosis not present

## 2014-03-30 DIAGNOSIS — IMO0002 Reserved for concepts with insufficient information to code with codable children: Secondary | ICD-10-CM | POA: Diagnosis not present

## 2014-03-30 DIAGNOSIS — M999 Biomechanical lesion, unspecified: Secondary | ICD-10-CM | POA: Diagnosis not present

## 2014-04-04 DIAGNOSIS — M1710 Unilateral primary osteoarthritis, unspecified knee: Secondary | ICD-10-CM | POA: Diagnosis not present

## 2014-04-11 ENCOUNTER — Ambulatory Visit: Payer: Self-pay | Admitting: Orthopedic Surgery

## 2014-04-17 DIAGNOSIS — M5137 Other intervertebral disc degeneration, lumbosacral region: Secondary | ICD-10-CM | POA: Diagnosis not present

## 2014-04-17 DIAGNOSIS — M4310 Spondylolisthesis, site unspecified: Secondary | ICD-10-CM | POA: Diagnosis not present

## 2014-04-17 DIAGNOSIS — G8929 Other chronic pain: Secondary | ICD-10-CM | POA: Diagnosis not present

## 2014-04-17 DIAGNOSIS — M545 Low back pain, unspecified: Secondary | ICD-10-CM | POA: Diagnosis not present

## 2014-04-17 DIAGNOSIS — Z981 Arthrodesis status: Secondary | ICD-10-CM | POA: Diagnosis not present

## 2014-04-24 ENCOUNTER — Encounter (HOSPITAL_BASED_OUTPATIENT_CLINIC_OR_DEPARTMENT_OTHER): Payer: Self-pay | Admitting: *Deleted

## 2014-04-24 NOTE — Progress Notes (Signed)
To come in for labs-bring all meds and overnight bag

## 2014-04-25 ENCOUNTER — Encounter (HOSPITAL_BASED_OUTPATIENT_CLINIC_OR_DEPARTMENT_OTHER)
Admission: RE | Admit: 2014-04-25 | Discharge: 2014-04-25 | Disposition: A | Discharge status: Home / Self Care | Payer: Medicare Other | Source: Ambulatory Visit | Attending: Orthopedic Surgery | Admitting: Orthopedic Surgery

## 2014-04-25 DIAGNOSIS — Z885 Allergy status to narcotic agent status: Secondary | ICD-10-CM | POA: Diagnosis not present

## 2014-04-25 DIAGNOSIS — E785 Hyperlipidemia, unspecified: Secondary | ICD-10-CM | POA: Diagnosis not present

## 2014-04-25 DIAGNOSIS — M1712 Unilateral primary osteoarthritis, left knee: Secondary | ICD-10-CM | POA: Diagnosis not present

## 2014-04-25 DIAGNOSIS — M4806 Spinal stenosis, lumbar region: Secondary | ICD-10-CM | POA: Diagnosis not present

## 2014-04-25 DIAGNOSIS — K219 Gastro-esophageal reflux disease without esophagitis: Secondary | ICD-10-CM | POA: Diagnosis not present

## 2014-04-25 DIAGNOSIS — Z886 Allergy status to analgesic agent status: Secondary | ICD-10-CM | POA: Diagnosis not present

## 2014-04-25 DIAGNOSIS — I10 Essential (primary) hypertension: Secondary | ICD-10-CM | POA: Diagnosis not present

## 2014-04-25 DIAGNOSIS — R0602 Shortness of breath: Secondary | ICD-10-CM | POA: Diagnosis not present

## 2014-04-25 LAB — BASIC METABOLIC PANEL
Anion gap: 9 (ref 5–15)
BUN: 12 mg/dL (ref 6–23)
CHLORIDE: 103 meq/L (ref 96–112)
CO2: 28 meq/L (ref 19–32)
Calcium: 9.5 mg/dL (ref 8.4–10.5)
Creatinine, Ser: 1.07 mg/dL (ref 0.50–1.10)
GFR calc Af Amer: 60 mL/min — ABNORMAL LOW (ref >90)
GFR, EST NON AFRICAN AMERICAN: 51 mL/min — AB (ref >90)
Glucose, Bld: 63 mg/dL — ABNORMAL LOW (ref 70–99)
POTASSIUM: 4.5 meq/L (ref 3.7–5.3)
SODIUM: 140 meq/L (ref 137–147)

## 2014-04-25 LAB — CBC
HEMATOCRIT: 39.5 % (ref 36.0–46.0)
HEMOGLOBIN: 12.9 g/dL (ref 12.0–15.0)
MCH: 29.1 pg (ref 26.0–34.0)
MCHC: 32.7 g/dL (ref 30.0–36.0)
MCV: 89.2 fL (ref 78.0–100.0)
Platelets: 325 10*3/uL (ref 150–400)
RBC: 4.43 MIL/uL (ref 3.87–5.11)
RDW: 13 % (ref 11.5–15.5)
WBC: 8.7 10*3/uL (ref 4.0–10.5)

## 2014-04-27 ENCOUNTER — Ambulatory Visit (HOSPITAL_BASED_OUTPATIENT_CLINIC_OR_DEPARTMENT_OTHER)
Admission: RE | Admit: 2014-04-27 | Discharge: 2014-04-28 | Disposition: A | Discharge status: Home / Self Care | Payer: Medicare Other | Source: Ambulatory Visit | Attending: Orthopedic Surgery | Admitting: Orthopedic Surgery

## 2014-04-27 ENCOUNTER — Encounter (HOSPITAL_BASED_OUTPATIENT_CLINIC_OR_DEPARTMENT_OTHER)
Admission: RE | Disposition: A | Discharge status: Home / Self Care | Payer: Self-pay | Source: Ambulatory Visit | Attending: Orthopedic Surgery

## 2014-04-27 ENCOUNTER — Ambulatory Visit (HOSPITAL_COMMUNITY): Payer: Medicare Other

## 2014-04-27 ENCOUNTER — Ambulatory Visit (HOSPITAL_BASED_OUTPATIENT_CLINIC_OR_DEPARTMENT_OTHER): Payer: Medicare Other | Admitting: Certified Registered"

## 2014-04-27 ENCOUNTER — Encounter (HOSPITAL_BASED_OUTPATIENT_CLINIC_OR_DEPARTMENT_OTHER): Payer: Medicare Other | Admitting: Certified Registered"

## 2014-04-27 ENCOUNTER — Encounter (HOSPITAL_BASED_OUTPATIENT_CLINIC_OR_DEPARTMENT_OTHER): Payer: Self-pay | Admitting: *Deleted

## 2014-04-27 DIAGNOSIS — Z471 Aftercare following joint replacement surgery: Secondary | ICD-10-CM | POA: Diagnosis not present

## 2014-04-27 DIAGNOSIS — R0602 Shortness of breath: Secondary | ICD-10-CM | POA: Insufficient documentation

## 2014-04-27 DIAGNOSIS — M1712 Unilateral primary osteoarthritis, left knee: Secondary | ICD-10-CM | POA: Diagnosis present

## 2014-04-27 DIAGNOSIS — K219 Gastro-esophageal reflux disease without esophagitis: Secondary | ICD-10-CM | POA: Insufficient documentation

## 2014-04-27 DIAGNOSIS — E785 Hyperlipidemia, unspecified: Secondary | ICD-10-CM | POA: Insufficient documentation

## 2014-04-27 DIAGNOSIS — I10 Essential (primary) hypertension: Secondary | ICD-10-CM | POA: Insufficient documentation

## 2014-04-27 DIAGNOSIS — M4806 Spinal stenosis, lumbar region: Secondary | ICD-10-CM | POA: Insufficient documentation

## 2014-04-27 DIAGNOSIS — Z885 Allergy status to narcotic agent status: Secondary | ICD-10-CM | POA: Insufficient documentation

## 2014-04-27 DIAGNOSIS — M79662 Pain in left lower leg: Secondary | ICD-10-CM | POA: Diagnosis not present

## 2014-04-27 DIAGNOSIS — Z886 Allergy status to analgesic agent status: Secondary | ICD-10-CM | POA: Insufficient documentation

## 2014-04-27 DIAGNOSIS — Z96652 Presence of left artificial knee joint: Secondary | ICD-10-CM | POA: Diagnosis not present

## 2014-04-27 DIAGNOSIS — G8918 Other acute postprocedural pain: Secondary | ICD-10-CM | POA: Diagnosis not present

## 2014-04-27 HISTORY — DX: Unilateral primary osteoarthritis, left knee: M17.12

## 2014-04-27 HISTORY — PX: PARTIAL KNEE ARTHROPLASTY: SHX2174

## 2014-04-27 SURGERY — ARTHROPLASTY, KNEE, UNICOMPARTMENTAL
Anesthesia: General | Site: Knee | Laterality: Left

## 2014-04-27 MED ORDER — SENNA 8.6 MG PO TABS
1.0000 | ORAL_TABLET | Freq: Two times a day (BID) | ORAL | Status: DC
  Administered 2014-04-27: 8.6 mg via ORAL
  Filled 2014-04-27: qty 1

## 2014-04-27 MED ORDER — FENTANYL CITRATE 0.05 MG/ML IJ SOLN
50.0000 ug | INTRAMUSCULAR | Status: DC | PRN
  Administered 2014-04-27: 75 ug via INTRAVENOUS

## 2014-04-27 MED ORDER — ONDANSETRON HCL 4 MG PO TABS: 4.0000 mg | ORAL_TABLET | Freq: Three times a day (TID) | ORAL | Status: DC | PRN

## 2014-04-27 MED ORDER — DEXAMETHASONE SODIUM PHOSPHATE 4 MG/ML IJ SOLN
INTRAMUSCULAR | Status: DC | PRN
  Administered 2014-04-27: 10 mg via INTRAVENOUS

## 2014-04-27 MED ORDER — POLYETHYLENE GLYCOL 3350 17 G PO PACK: 17.0000 g | PACK | Freq: Every day | ORAL | Status: DC | PRN

## 2014-04-27 MED ORDER — METOCLOPRAMIDE HCL 5 MG PO TABS: 5.0000 mg | ORAL_TABLET | Freq: Three times a day (TID) | ORAL | Status: DC | PRN

## 2014-04-27 MED ORDER — ONDANSETRON HCL 4 MG/2ML IJ SOLN: 4.0000 mg | Freq: Four times a day (QID) | INTRAMUSCULAR | Status: DC | PRN

## 2014-04-27 MED ORDER — HYDROMORPHONE HCL 1 MG/ML IJ SOLN
INTRAMUSCULAR | Status: AC
  Filled 2014-04-27: qty 1

## 2014-04-27 MED ORDER — SENNA-DOCUSATE SODIUM 8.6-50 MG PO TABS: 2.0000 | ORAL_TABLET | Freq: Every day | ORAL | Status: DC

## 2014-04-27 MED ORDER — SUCCINYLCHOLINE CHLORIDE 20 MG/ML IJ SOLN
INTRAMUSCULAR | Status: AC
  Filled 2014-04-27: qty 1

## 2014-04-27 MED ORDER — EPHEDRINE SULFATE 50 MG/ML IJ SOLN
INTRAMUSCULAR | Status: DC | PRN
  Administered 2014-04-27 (×2): 10 mg via INTRAVENOUS

## 2014-04-27 MED ORDER — PANTOPRAZOLE SODIUM 40 MG PO TBEC: 80.0000 mg | DELAYED_RELEASE_TABLET | Freq: Every day | ORAL | Status: DC

## 2014-04-27 MED ORDER — LACTATED RINGERS IV SOLN
INTRAVENOUS | Status: DC
  Administered 2014-04-27 (×3): via INTRAVENOUS

## 2014-04-27 MED ORDER — ZOLPIDEM TARTRATE 5 MG PO TABS: 5.0000 mg | ORAL_TABLET | Freq: Every evening | ORAL | Status: DC | PRN

## 2014-04-27 MED ORDER — BUPIVACAINE-EPINEPHRINE (PF) 0.5% -1:200000 IJ SOLN
INTRAMUSCULAR | Status: DC | PRN
  Administered 2014-04-27: 30 mL via PERINEURAL

## 2014-04-27 MED ORDER — SUFENTANIL CITRATE 50 MCG/ML IV SOLN
INTRAVENOUS | Status: AC
  Filled 2014-04-27: qty 1

## 2014-04-27 MED ORDER — LOSARTAN POTASSIUM 25 MG PO TABS: 25.0000 mg | ORAL_TABLET | Freq: Every day | ORAL | Status: DC

## 2014-04-27 MED ORDER — METOCLOPRAMIDE HCL 5 MG/ML IJ SOLN
5.0000 mg | Freq: Three times a day (TID) | INTRAMUSCULAR | Status: DC | PRN
Start: 2014-04-27 — End: 2014-04-28

## 2014-04-27 MED ORDER — SUFENTANIL CITRATE 50 MCG/ML IV SOLN
INTRAVENOUS | Status: DC | PRN
  Administered 2014-04-27 (×2): 5 ug via INTRAVENOUS

## 2014-04-27 MED ORDER — SODIUM CHLORIDE 0.9 % IV SOLN
INTRAVENOUS | Status: DC | PRN
  Administered 2014-04-27: 1000 mL

## 2014-04-27 MED ORDER — HYDROMORPHONE HCL 1 MG/ML IJ SOLN
0.5000 mg | INTRAMUSCULAR | Status: DC | PRN
  Administered 2014-04-27: 1 mg via INTRAVENOUS
  Filled 2014-04-27: qty 1

## 2014-04-27 MED ORDER — LIDOCAINE HCL (CARDIAC) 20 MG/ML IV SOLN
INTRAVENOUS | Status: DC | PRN
  Administered 2014-04-27: 40 mg via INTRAVENOUS

## 2014-04-27 MED ORDER — OXYCODONE HCL 5 MG PO TABS
5.0000 mg | ORAL_TABLET | Freq: Once | ORAL | Status: AC | PRN
Start: 2014-04-27 — End: 2014-04-27
  Filled 2014-04-27: qty 1

## 2014-04-27 MED ORDER — ONDANSETRON HCL 4 MG PO TABS: 4.0000 mg | ORAL_TABLET | Freq: Four times a day (QID) | ORAL | Status: DC | PRN

## 2014-04-27 MED ORDER — MIDAZOLAM HCL 2 MG/2ML IJ SOLN
INTRAMUSCULAR | Status: AC
  Filled 2014-04-27: qty 2

## 2014-04-27 MED ORDER — CEFAZOLIN SODIUM-DEXTROSE 2-3 GM-% IV SOLR
INTRAVENOUS | Status: AC
  Filled 2014-04-27: qty 50

## 2014-04-27 MED ORDER — FENTANYL CITRATE 0.05 MG/ML IJ SOLN
INTRAMUSCULAR | Status: AC
  Filled 2014-04-27: qty 2

## 2014-04-27 MED ORDER — DOCUSATE SODIUM 100 MG PO CAPS
100.0000 mg | ORAL_CAPSULE | Freq: Two times a day (BID) | ORAL | Status: DC
  Administered 2014-04-27: 100 mg via ORAL
  Filled 2014-04-27: qty 1

## 2014-04-27 MED ORDER — MIDAZOLAM HCL 2 MG/2ML IJ SOLN
1.0000 mg | INTRAMUSCULAR | Status: DC | PRN
  Administered 2014-04-27: 1.5 mg via INTRAVENOUS

## 2014-04-27 MED ORDER — CEFAZOLIN SODIUM 1-5 GM-% IV SOLN
INTRAVENOUS | Status: AC
  Filled 2014-04-27: qty 50

## 2014-04-27 MED ORDER — PROPOFOL 10 MG/ML IV BOLUS
INTRAVENOUS | Status: DC | PRN
  Administered 2014-04-27: 150 mg via INTRAVENOUS

## 2014-04-27 MED ORDER — CEFAZOLIN SODIUM 1-5 GM-% IV SOLN
1.0000 g | Freq: Four times a day (QID) | INTRAVENOUS | Status: AC
  Administered 2014-04-27 – 2014-04-28 (×3): 1 g via INTRAVENOUS

## 2014-04-27 MED ORDER — SODIUM CHLORIDE 0.9 % IV SOLN
INTRAVENOUS | Status: DC
  Administered 2014-04-27: 75 mL/h via INTRAVENOUS

## 2014-04-27 MED ORDER — BISACODYL 10 MG RE SUPP: 10.0000 mg | Freq: Every day | RECTAL | Status: DC | PRN

## 2014-04-27 MED ORDER — OXYCODONE HCL 5 MG PO TABS
5.0000 mg | ORAL_TABLET | ORAL | Status: DC | PRN
  Administered 2014-04-27 – 2014-04-28 (×4): 10 mg via ORAL
  Filled 2014-04-27 (×3): qty 2
  Filled 2014-04-27: qty 1

## 2014-04-27 MED ORDER — METHOCARBAMOL 500 MG PO TABS: 500.0000 mg | ORAL_TABLET | Freq: Four times a day (QID) | ORAL | Status: DC | PRN

## 2014-04-27 MED ORDER — GLYCOPYRROLATE 0.2 MG/ML IJ SOLN
INTRAMUSCULAR | Status: DC | PRN
  Administered 2014-04-27: .2 mg via INTRAVENOUS

## 2014-04-27 MED ORDER — OXYCODONE-ACETAMINOPHEN 10-325 MG PO TABS: 1.0000 | ORAL_TABLET | Freq: Four times a day (QID) | ORAL | Status: DC | PRN

## 2014-04-27 MED ORDER — MAGNESIUM CITRATE PO SOLN: 1.0000 | Freq: Once | ORAL | Status: AC | PRN

## 2014-04-27 MED ORDER — PROMETHAZINE HCL 25 MG/ML IJ SOLN: 6.2500 mg | INTRAMUSCULAR | Status: DC | PRN

## 2014-04-27 MED ORDER — HYDROMORPHONE HCL 1 MG/ML IJ SOLN
0.2500 mg | INTRAMUSCULAR | Status: DC | PRN
  Administered 2014-04-27 (×2): 0.5 mg via INTRAVENOUS

## 2014-04-27 MED ORDER — RIVAROXABAN 10 MG PO TABS: 10.0000 mg | ORAL_TABLET | Freq: Every day | ORAL | Status: DC

## 2014-04-27 MED ORDER — OXYCODONE-ACETAMINOPHEN 5-325 MG PO TABS: 1.0000 | ORAL_TABLET | ORAL | Status: DC | PRN

## 2014-04-27 MED ORDER — CEFAZOLIN SODIUM-DEXTROSE 2-3 GM-% IV SOLR
2.0000 g | INTRAVENOUS | Status: AC
  Administered 2014-04-27: 2 g via INTRAVENOUS

## 2014-04-27 MED ORDER — OXYCODONE HCL 5 MG/5ML PO SOLN
5.0000 mg | Freq: Once | ORAL | Status: AC | PRN
  Filled 2014-04-27: qty 5

## 2014-04-27 MED ORDER — BACLOFEN 10 MG PO TABS: 10.0000 mg | ORAL_TABLET | Freq: Three times a day (TID) | ORAL | Status: DC

## 2014-04-27 MED ORDER — PROPOFOL 10 MG/ML IV EMUL
INTRAVENOUS | Status: AC
  Filled 2014-04-27: qty 50

## 2014-04-27 MED ORDER — PHENYLEPHRINE HCL 10 MG/ML IJ SOLN
10.0000 mg | INTRAVENOUS | Status: DC | PRN
  Administered 2014-04-27: 40 ug via INTRAVENOUS
  Administered 2014-04-27: 40 ug/min via INTRAVENOUS

## 2014-04-27 MED ORDER — METHOCARBAMOL 1000 MG/10ML IJ SOLN: 500.0000 mg | Freq: Four times a day (QID) | INTRAVENOUS | Status: DC | PRN

## 2014-04-27 SURGICAL SUPPLY — 73 items
APL SKNCLS STERI-STRIP NONHPOA (GAUZE/BANDAGES/DRESSINGS) ×1
BANDAGE ELASTIC 6 VELCRO ST LF (GAUZE/BANDAGES/DRESSINGS) ×3 IMPLANT
BANDAGE ESMARK 6X9 LF (GAUZE/BANDAGES/DRESSINGS) ×1 IMPLANT
BENZOIN TINCTURE PRP APPL 2/3 (GAUZE/BANDAGES/DRESSINGS) ×3 IMPLANT
BIT DRILL QUICK REL 1/8 2PK SL (DRILL) IMPLANT
BLADE 11 SAFETY STRL DISP (BLADE) ×1 IMPLANT
BLADE SURG 10 STRL SS (BLADE) ×3 IMPLANT
BLADE SURG 15 STRL LF DISP TIS (BLADE) ×2 IMPLANT
BLADE SURG 15 STRL SS (BLADE) ×6
BNDG CMPR 9X6 STRL LF SNTH (GAUZE/BANDAGES/DRESSINGS) ×1
BNDG ESMARK 6X9 LF (GAUZE/BANDAGES/DRESSINGS) ×3
BOWL SMART MIX CTS (DISPOSABLE) ×3 IMPLANT
CANISTER SUCT 1200ML W/VALVE (MISCELLANEOUS) ×2 IMPLANT
CAPT KNEE OXFORD ×2 IMPLANT
CEMENT HV SMART SET (Cement) ×3 IMPLANT
CLOSURE STERI-STRIP 1/2X4 (GAUZE/BANDAGES/DRESSINGS) ×1
CLSR STERI-STRIP ANTIMIC 1/2X4 (GAUZE/BANDAGES/DRESSINGS) ×2 IMPLANT
COVER BACK TABLE 60X90IN (DRAPES) ×3 IMPLANT
CUFF TOURNIQUET SINGLE 34IN LL (TOURNIQUET CUFF) ×2 IMPLANT
DECANTER SPIKE VIAL GLASS SM (MISCELLANEOUS) IMPLANT
DRAPE EXTREMITY TIBURON (DRAPES) ×3 IMPLANT
DRAPE U 20/CS (DRAPES) ×3 IMPLANT
DRAPE U-SHAPE 47X51 STRL (DRAPES) ×3 IMPLANT
DRILL QUICK RELEASE 1/8 INCH (DRILL) ×2
DRSG PAD ABDOMINAL 8X10 ST (GAUZE/BANDAGES/DRESSINGS) ×1 IMPLANT
DURAPREP 26ML APPLICATOR (WOUND CARE) ×3 IMPLANT
ELECT REM PT RETURN 9FT ADLT (ELECTROSURGICAL) ×3
ELECTRODE REM PT RTRN 9FT ADLT (ELECTROSURGICAL) ×1 IMPLANT
FACESHIELD WRAPAROUND (MASK) ×9 IMPLANT
FACESHIELD WRAPAROUND OR TEAM (MASK) ×2 IMPLANT
GAUZE SPONGE 4X4 12PLY STRL (GAUZE/BANDAGES/DRESSINGS) ×3 IMPLANT
GLOVE BIO SURGEON STRL SZ8 (GLOVE) ×5 IMPLANT
GLOVE BIOGEL PI IND STRL 7.0 (GLOVE) IMPLANT
GLOVE BIOGEL PI IND STRL 8 (GLOVE) ×2 IMPLANT
GLOVE BIOGEL PI INDICATOR 7.0 (GLOVE) ×4
GLOVE BIOGEL PI INDICATOR 8 (GLOVE) ×6
GLOVE ECLIPSE 6.5 STRL STRAW (GLOVE) ×4 IMPLANT
GLOVE ORTHO TXT STRL SZ7.5 (GLOVE) ×3 IMPLANT
GOWN STRL REUS W/ TWL LRG LVL3 (GOWN DISPOSABLE) ×1 IMPLANT
GOWN STRL REUS W/ TWL XL LVL3 (GOWN DISPOSABLE) ×2 IMPLANT
GOWN STRL REUS W/TWL LRG LVL3 (GOWN DISPOSABLE) ×3
GOWN STRL REUS W/TWL XL LVL3 (GOWN DISPOSABLE) ×9
HANDPIECE INTERPULSE COAX TIP (DISPOSABLE) ×3
IMMOBILIZER KNEE 22 UNIV (SOFTGOODS) ×3 IMPLANT
IMMOBILIZER KNEE 24 THIGH 36 (MISCELLANEOUS) ×1 IMPLANT
IMMOBILIZER KNEE 24 UNIV (MISCELLANEOUS)
KNEE WRAP E Z 3 GEL PACK (MISCELLANEOUS) ×2 IMPLANT
MANIFOLD NEPTUNE II (INSTRUMENTS) ×1 IMPLANT
NS IRRIG 1000ML POUR BTL (IV SOLUTION) ×3 IMPLANT
PACK ARTHROSCOPY DSU (CUSTOM PROCEDURE TRAY) ×3 IMPLANT
PACK BASIN DAY SURGERY FS (CUSTOM PROCEDURE TRAY) ×3 IMPLANT
PENCIL BUTTON HOLSTER BLD 10FT (ELECTRODE) ×3 IMPLANT
SAWBLADE OXFORD PARTIAL (BLADE) ×3 IMPLANT
SET HNDPC FAN SPRY TIP SCT (DISPOSABLE) ×1 IMPLANT
SHEET MEDIUM DRAPE 40X70 STRL (DRAPES) ×3 IMPLANT
SLEEVE SCD COMPRESS KNEE MED (MISCELLANEOUS) ×3 IMPLANT
SPONGE LAP 18X18 X RAY DECT (DISPOSABLE) ×3 IMPLANT
STAPLER VISISTAT 35W (STAPLE) IMPLANT
SUCTION FRAZIER TIP 10 FR DISP (SUCTIONS) ×3 IMPLANT
SUT MNCRL AB 4-0 PS2 18 (SUTURE) IMPLANT
SUT VIC AB 0 CT1 27 (SUTURE) ×3
SUT VIC AB 0 CT1 27XBRD ANBCTR (SUTURE) ×1 IMPLANT
SUT VIC AB 2-0 SH 27 (SUTURE) ×3
SUT VIC AB 2-0 SH 27XBRD (SUTURE) ×1 IMPLANT
SUT VICRYL 3-0 CR8 SH (SUTURE) ×3 IMPLANT
SUT VICRYL 4-0 PS2 18IN ABS (SUTURE) IMPLANT
SYR BULB IRRIGATION 50ML (SYRINGE) ×3 IMPLANT
TOWEL OR 17X24 6PK STRL BLUE (TOWEL DISPOSABLE) ×6 IMPLANT
TOWEL OR NON WOVEN STRL DISP B (DISPOSABLE) ×6 IMPLANT
TUBE CONNECTING 20'X1/4 (TUBING)
TUBE CONNECTING 20X1/4 (TUBING) IMPLANT
UNDERPAD 30X30 INCONTINENT (UNDERPADS AND DIAPERS) ×1 IMPLANT
YANKAUER SUCT BULB TIP NO VENT (SUCTIONS) ×2 IMPLANT

## 2014-04-27 NOTE — Discharge Instructions (Signed)
Diet: As you were doing prior to hospitalization   Shower:  May shower but keep the wounds dry, use an occlusive plastic wrap, NO SOAKING IN TUB.  If the bandage gets wet, change with a clean dry gauze.  Dressing:  You may change your dressing 3-5 days after surgery.  Then change the dressing daily with sterile gauze dressing.    There are sticky tapes (steri-strips) on your wounds and all the stitches are absorbable.  Leave the steri-strips in place when changing your dressings, they will peel off with time, usually 2-3 weeks.  Activity:  Increase activity slowly as tolerated, but follow the weight bearing instructions below.  No lifting or driving for 6 weeks.  Weight Bearing:   As tolerated.    To prevent constipation: you may use a stool softener such as -  Colace (over the counter) 100 mg by mouth twice a day  Drink plenty of fluids (prune juice may be helpful) and high fiber foods Miralax (over the counter) for constipation as needed.    Itching:  If you experience itching with your medications, try taking only a single pain pill, or even half a pain pill at a time.  You may take up to 10 pain pills per day, and you can also use benadryl over the counter for itching or also to help with sleep.   Precautions:  If you experience chest pain or shortness of breath - call 911 immediately for transfer to the hospital emergency department!!  If you develop a fever greater that 101 F, purulent drainage from wound, increased redness or drainage from wound, or calf pain -- Call the office at [phone]                                                Follow- Up Appointment:  Please call for an appointment to be seen in 2 weeks [place] - [phone]

## 2014-04-27 NOTE — Anesthesia Preprocedure Evaluation (Signed)
Anesthesia Evaluation  Patient identified by MRN, date of birth, ID band Patient awake    History of Anesthesia Complications Negative for: history of anesthetic complications  Airway Mallampati: I  Neck ROM: Full    Dental   Pulmonary neg pulmonary ROS,  breath sounds clear to auscultation        Cardiovascular hypertension, Rhythm:Regular Rate:Normal     Neuro/Psych  Headaches,    GI/Hepatic Neg liver ROS, GERD-  ,  Endo/Other  negative endocrine ROS  Renal/GU negative Renal ROS     Musculoskeletal  (+) Arthritis -,   Abdominal   Peds  Hematology   Anesthesia Other Findings   Reproductive/Obstetrics                           Anesthesia Physical Anesthesia Plan  ASA: II  Anesthesia Plan: General   Post-op Pain Management:    Induction: Intravenous  Airway Management Planned: LMA  Additional Equipment:   Intra-op Plan:   Post-operative Plan: Extubation in OR  Informed Consent:   Dental advisory given  Plan Discussed with: CRNA and Surgeon  Anesthesia Plan Comments:         Anesthesia Quick Evaluation

## 2014-04-27 NOTE — Op Note (Signed)
04/27/2014  9:17 AM  PATIENT:  Melissa Maldonado    PRE-OPERATIVE DIAGNOSIS:  Primary localized osteoarthrosis, lower leg, left [M17.12]  POST-OPERATIVE DIAGNOSIS:  Same  PROCEDURE:  LEFT UNICOMPARTMENTAL KNEE  SURGEON:  [name], MD  PHYSICIAN ASSISTANT: Joya Gaskins, OPA-C, present and scrubbed throughout the case, critical for completion in a timely fashion, and for retraction, instrumentation, and closure.  Second Assistant: Mechele Claude, PA-Student  ANESTHESIA:   General  PREOPERATIVE INDICATIONS:  Melissa Maldonado is a  73 y.o. female with a diagnosis of Primary localized osteoarthrosis, lower leg, left [M17.12] who failed conservative measures and elected for surgical management.    The risks benefits and alternatives were discussed with the patient preoperatively including but not limited to the risks of infection, bleeding, nerve injury, cardiopulmonary complications, blood clots, the need for revision surgery, among others, and the patient was willing to proceed.  OPERATIVE IMPLANTS: [employer] mobile bearing medial compartment arthroplasty femur size small, tibia size D, bearing size 4.  OPERATIVE FINDINGS: Endstage grade 4 medial compartment osteoarthritis. No significant changes in the lateral compartment, mild changes partial thickness in the p patellofemoral joint.  The ACL was intact.  OPERATIVE PROCEDURE: The patient was brought to the operating room placed in supine position. General anesthesia was administered. IV antibiotics were given. The lower extremity was placed in the legholder and prepped and draped in usual sterile fashion.  Time out was performed.  The leg was elevated and exsanguinated and the tourniquet was inflated. Anteromedial incision was performed, and I took care to preserve the MCL. Parapatellar incision was carried out, and the osteophytes were excised, along with the medial meniscus and a small portion of the fat pad.  The extra  medullary tibial cutting jig was applied, using the spoon and the 54mm G-Clamp and the 2 mm shim, and I took care to protect the anterior cruciate ligament insertion and the tibial spine. The medial collateral ligament was also protected, and I resected my proximal tibia, matching the anatomic slope.   The proximal tibial bony cut was removed in one piece, and I turned my attention to the femur.  The intramedullary femoral rod was placed using the drill, and then using the appropriate reference, I assembled the femoral jig, setting my posterior cutting block. I resected my posterior femur, used the 0 spigot for the anterior femur, and then measured my gap.   I then used the appropriate mill to match the extension gap to the flexion gap. The second milling was at a 4, then a 5.  The gaps were then measured again with the appropriate feeler gauges. Once I had balanced flexion and extension gaps, I then completed the preparation of the femur.  I milled off the anterior aspect of the distal femur to prevent impingement. I also exposed the tibia, and selected the above-named component, and then used the cutting jig to prepare the keel slot on the tibia. I also used the awl to curette out the bone to complete the preparation of the keel. The back wall was intact.  I then placed trial components, and it was found to have excellent motion, and appropriate balance.  I then cemented the components into place, cementing the tibia first, removing all excess cement, and then cementing the femur.  All loose cement was removed.  The real polyethylene insert was applied manually, and the knee was taken through functional range of motion, and found to have excellent stability and restoration of joint motion, with  excellent balance.  The wounds were irrigated copiously, and the parapatellar tissue closed with Vicryl, followed by Vicryl for the subcutaneous tissue, with routine closure with Steri-Strips and sterile  gauze.  The tourniquet was released, and the patient was awakened and extubated and returned to PACU in stable and satisfactory condition. There were no complications.

## 2014-04-27 NOTE — Anesthesia Procedure Notes (Addendum)
Anesthesia Regional Block:  Femoral nerve block  Pre-Anesthetic Checklist: ,, timeout performed, Correct Patient, Correct Site, Correct Laterality, Correct Procedure, Correct Position, site marked, Risks and benefits discussed, at surgeon's request and post-op pain management   Prep: chloraprep       Needles:  Injection technique: Single-shot  Needle Type: Echogenic Needle     Needle Length: 9cm 9 cm Needle Gauge: 21 and 21 G  Needle insertion depth: 6 cm   Additional Needles:  Procedures: ultrasound guided (picture in chart) and nerve stimulator Femoral nerve block  Nerve Stimulator or Paresthesia:  Response: Twitch elicited, 0.8 mA,   Additional Responses:   Narrative:  Start time: 04/27/2014 7:00 AM End time: 04/27/2014 7:15 AM Injection made incrementally with aspirations every 5 mL.  Performed by: Personally  Anesthesiologist: Bartolo Darter, MD  Additional Notes: BP cuff, EKG monitors applied. Sedation begun. Femoral artery palpated for location of nerve. After nerve location anesthetic injected incrementally, slowly , and after neg aspirations. Tolerated well.   Procedure Name: LMA Insertion Date/Time: 04/27/2014 7:33 AM Performed by: Melynda Ripple D Pre-anesthesia Checklist: Patient identified, Emergency Drugs available, Suction available and Patient being monitored Patient Re-evaluated:Patient Re-evaluated prior to inductionOxygen Delivery Method: Circle System Utilized Preoxygenation: Pre-oxygenation with 100% oxygen Intubation Type: IV induction Ventilation: Mask ventilation without difficulty LMA: LMA inserted LMA Size: 4.0 Number of attempts: 1 Airway Equipment and Method: bite block Placement Confirmation: positive ETCO2 Tube secured with: Tape Dental Injury: Teeth and Oropharynx as per pre-operative assessment

## 2014-04-27 NOTE — Anesthesia Postprocedure Evaluation (Signed)
  Anesthesia Post-op Note  Patient: Melissa Maldonado  Procedure(s) Performed: Procedure(s): LEFT UNICOMPARTMENTAL KNEE (Left)  Patient Location: PACU  Anesthesia Type:GA combined with regional for post-op pain  Level of Consciousness: awake and alert   Airway and Oxygen Therapy: Patient Spontanous Breathing  Post-op Pain: mild  Post-op Assessment: Post-op Vital signs reviewed  Post-op Vital Signs: stable  Last Vitals:  Filed Vitals:   04/27/14 1140  BP: 107/61  Pulse: 71  Temp: 36.7 C  Resp: 18    Complications: No apparent anesthesia complications

## 2014-04-27 NOTE — H&P (Signed)
PREOPERATIVE H&P  Chief Complaint: Primary localized osteoarthrosis, lower leg, left [M17.12]  HPI: Melissa Maldonado is a 73 y.o. female who presents for preoperative history and physical with a diagnosis of Primary localized osteoarthrosis, lower leg, left [M17.12]. Symptoms are rated as moderate to severe, and have been worsening.  This is significantly impairing activities of daily living.  She has elected for surgical management.  She has failed injections, activity modification, anti-inflammatories, and assistive devices, and arthroscopic surgery.  Past Medical History  Diagnosis Date  . Hypertension     takes losartan daily  . Hyperlipidemia     takes Lipitor every other day  . Shortness of breath     with exertion  . Headache(784.0)     occasionally  . Arthritis   . Chronic back pain     lumbar stenosis  . GERD (gastroesophageal reflux disease)     occasionally but no meds required  . History of colon polyps   . History of blood transfusion   . Early cataracts, bilateral   . Tear of medial meniscus of left knee 09/08/2013   Past Surgical History  Procedure Laterality Date  . Appendectomy    . Colonoscopy    . Lumbar laminectomy/decompression microdiscectomy  05/13/2012    Procedure: LUMBAR LAMINECTOMY/DECOMPRESSION MICRODISCECTOMY 1 LEVEL;  Surgeon: [name], MD;  Location: [facility];  Service: Neurosurgery;  Laterality: Bilateral;  Bilateral Lumbar one -two Decompressive Laminectomy  . Back surgery  2010    lumb fusion  . Upper gi endoscopy    . Eye surgery      both cataracts  . Knee arthroscopy with medial menisectomy Left 09/08/2013    Procedure: LEFT KNEE ARTHROSCOPY WITH PARTIAL MEDIAL MENISCECTOMY;  Surgeon: [name], MD;  Location: [facility];  Service: Orthopedics;  Laterality: Left;   History   Social History  . Marital Status: Married    Spouse Name: N/A    Number of Children: N/A  . Years of Education: N/A   Social History  Main Topics  . Smoking status: Never Smoker   . Smokeless tobacco: None  . Alcohol Use: Yes     Comment: socially wine  . Drug Use: No  . Sexual Activity: Yes    Birth Control/ Protection: Post-menopausal   Other Topics Concern  . None   Social History Narrative  . None   History reviewed. No pertinent family history. Allergies  Allergen Reactions  . Aspirin Other (See Comments)    "knocked out"  . Codeine Nausea Only   Prior to Admission medications   Medication Sig Start Date End Date Taking? Authorizing Provider  esomeprazole (NEXIUM) 40 MG capsule Take 40 mg by mouth as needed.    Yes Historical Provider, MD  losartan (COZAAR) 25 MG tablet Take 25 mg by mouth daily.   Yes Historical Provider, MD  sennosides-docusate sodium (SENOKOT-S) 8.6-50 MG tablet Take 2 tablets by mouth daily. 09/08/13  Yes [name], MD     Positive ROS: All other systems have been reviewed and were otherwise negative with the exception of those mentioned in the HPI and as above.  Physical Exam: General: Alert, no acute distress Cardiovascular: No pedal edema Respiratory: No cyanosis, no use of accessory musculature GI: No organomegaly, abdomen is soft and non-tender Skin: No lesions in the area of chief complaint Neurologic: Sensation intact distally Psychiatric: Patient is competent for consent with normal mood and affect Lymphatic: No axillary or cervical lymphadenopathy  MUSCULOSKELETAL:  left knee with medial joint line crepitus and pain, range of motion 0-130, stable to varus and valgus and Lachman maneuver with the exception of pseudo-laxity to valgus testing.  Assessment: Primary localized osteoarthrosis, lower leg, left [M17.12]  Plan: Plan for Procedure(s): LEFT UNICOMPARTMENTAL KNEE  The risks benefits and alternatives were discussed with the patient including but not limited to the risks of nonoperative treatment, versus surgical intervention including infection, bleeding,  nerve injury,  blood clots, cardiopulmonary complications, morbidity, mortality, among others, and they were willing to proceed.   [name], MD Cell [phone]   04/27/2014 7:18 AM

## 2014-04-27 NOTE — Transfer of Care (Signed)
Immediate Anesthesia Transfer of Care Note  Patient: Melissa Maldonado  Procedure(s) Performed: Procedure(s): LEFT UNICOMPARTMENTAL KNEE (Left)  Patient Location: PACU  Anesthesia Type:General and Regional  Level of Consciousness: awake, oriented and patient cooperative  Airway & Oxygen Therapy: Patient Spontanous Breathing and Patient connected to face mask oxygen  Post-op Assessment: Report given to PACU RN and Post -op Vital signs reviewed and stable  Post vital signs: Reviewed and stable  Complications: No apparent anesthesia complications

## 2014-04-28 DIAGNOSIS — E785 Hyperlipidemia, unspecified: Secondary | ICD-10-CM | POA: Diagnosis not present

## 2014-04-28 DIAGNOSIS — M1712 Unilateral primary osteoarthritis, left knee: Secondary | ICD-10-CM | POA: Diagnosis not present

## 2014-04-28 DIAGNOSIS — I10 Essential (primary) hypertension: Secondary | ICD-10-CM | POA: Diagnosis not present

## 2014-04-28 DIAGNOSIS — M4806 Spinal stenosis, lumbar region: Secondary | ICD-10-CM | POA: Diagnosis not present

## 2014-04-28 DIAGNOSIS — R0602 Shortness of breath: Secondary | ICD-10-CM | POA: Diagnosis not present

## 2014-04-28 DIAGNOSIS — K219 Gastro-esophageal reflux disease without esophagitis: Secondary | ICD-10-CM | POA: Diagnosis not present

## 2014-04-28 MED ORDER — CEFAZOLIN SODIUM 1-5 GM-% IV SOLN
INTRAVENOUS | Status: AC
  Filled 2014-04-28: qty 50

## 2014-04-29 DIAGNOSIS — Z96652 Presence of left artificial knee joint: Secondary | ICD-10-CM | POA: Diagnosis not present

## 2014-04-29 DIAGNOSIS — S83222D Peripheral tear of medial meniscus, current injury, left knee, subsequent encounter: Secondary | ICD-10-CM | POA: Diagnosis not present

## 2014-04-29 DIAGNOSIS — Z471 Aftercare following joint replacement surgery: Secondary | ICD-10-CM | POA: Diagnosis not present

## 2014-04-29 DIAGNOSIS — M1712 Unilateral primary osteoarthritis, left knee: Secondary | ICD-10-CM | POA: Diagnosis not present

## 2014-04-29 DIAGNOSIS — I10 Essential (primary) hypertension: Secondary | ICD-10-CM | POA: Diagnosis not present

## 2014-04-30 ENCOUNTER — Encounter (HOSPITAL_BASED_OUTPATIENT_CLINIC_OR_DEPARTMENT_OTHER): Payer: Self-pay | Admitting: Orthopedic Surgery

## 2014-05-01 DIAGNOSIS — Z96652 Presence of left artificial knee joint: Secondary | ICD-10-CM | POA: Diagnosis not present

## 2014-05-01 DIAGNOSIS — S83222D Peripheral tear of medial meniscus, current injury, left knee, subsequent encounter: Secondary | ICD-10-CM | POA: Diagnosis not present

## 2014-05-01 DIAGNOSIS — Z471 Aftercare following joint replacement surgery: Secondary | ICD-10-CM | POA: Diagnosis not present

## 2014-05-01 DIAGNOSIS — I10 Essential (primary) hypertension: Secondary | ICD-10-CM | POA: Diagnosis not present

## 2014-05-01 DIAGNOSIS — M1712 Unilateral primary osteoarthritis, left knee: Secondary | ICD-10-CM | POA: Diagnosis not present

## 2014-05-02 DIAGNOSIS — I10 Essential (primary) hypertension: Secondary | ICD-10-CM | POA: Diagnosis not present

## 2014-05-02 DIAGNOSIS — Z471 Aftercare following joint replacement surgery: Secondary | ICD-10-CM | POA: Diagnosis not present

## 2014-05-02 DIAGNOSIS — Z96652 Presence of left artificial knee joint: Secondary | ICD-10-CM | POA: Diagnosis not present

## 2014-05-02 DIAGNOSIS — S83222D Peripheral tear of medial meniscus, current injury, left knee, subsequent encounter: Secondary | ICD-10-CM | POA: Diagnosis not present

## 2014-05-02 DIAGNOSIS — M1712 Unilateral primary osteoarthritis, left knee: Secondary | ICD-10-CM | POA: Diagnosis not present

## 2014-05-03 DIAGNOSIS — Z471 Aftercare following joint replacement surgery: Secondary | ICD-10-CM | POA: Diagnosis not present

## 2014-05-03 DIAGNOSIS — M1712 Unilateral primary osteoarthritis, left knee: Secondary | ICD-10-CM | POA: Diagnosis not present

## 2014-05-03 DIAGNOSIS — Z96652 Presence of left artificial knee joint: Secondary | ICD-10-CM | POA: Diagnosis not present

## 2014-05-03 DIAGNOSIS — S83222D Peripheral tear of medial meniscus, current injury, left knee, subsequent encounter: Secondary | ICD-10-CM | POA: Diagnosis not present

## 2014-05-03 DIAGNOSIS — I10 Essential (primary) hypertension: Secondary | ICD-10-CM | POA: Diagnosis not present

## 2014-05-04 DIAGNOSIS — M1712 Unilateral primary osteoarthritis, left knee: Secondary | ICD-10-CM | POA: Diagnosis not present

## 2014-05-04 DIAGNOSIS — Z471 Aftercare following joint replacement surgery: Secondary | ICD-10-CM | POA: Diagnosis not present

## 2014-05-04 DIAGNOSIS — I10 Essential (primary) hypertension: Secondary | ICD-10-CM | POA: Diagnosis not present

## 2014-05-04 DIAGNOSIS — S83222D Peripheral tear of medial meniscus, current injury, left knee, subsequent encounter: Secondary | ICD-10-CM | POA: Diagnosis not present

## 2014-05-04 DIAGNOSIS — Z96652 Presence of left artificial knee joint: Secondary | ICD-10-CM | POA: Diagnosis not present

## 2014-05-07 DIAGNOSIS — M1712 Unilateral primary osteoarthritis, left knee: Secondary | ICD-10-CM | POA: Diagnosis not present

## 2014-05-07 DIAGNOSIS — Z471 Aftercare following joint replacement surgery: Secondary | ICD-10-CM | POA: Diagnosis not present

## 2014-05-07 DIAGNOSIS — Z96652 Presence of left artificial knee joint: Secondary | ICD-10-CM | POA: Diagnosis not present

## 2014-05-07 DIAGNOSIS — I10 Essential (primary) hypertension: Secondary | ICD-10-CM | POA: Diagnosis not present

## 2014-05-07 DIAGNOSIS — S83222D Peripheral tear of medial meniscus, current injury, left knee, subsequent encounter: Secondary | ICD-10-CM | POA: Diagnosis not present

## 2014-05-08 DIAGNOSIS — Z96652 Presence of left artificial knee joint: Secondary | ICD-10-CM | POA: Diagnosis not present

## 2014-05-08 DIAGNOSIS — I10 Essential (primary) hypertension: Secondary | ICD-10-CM | POA: Diagnosis not present

## 2014-05-08 DIAGNOSIS — S83222D Peripheral tear of medial meniscus, current injury, left knee, subsequent encounter: Secondary | ICD-10-CM | POA: Diagnosis not present

## 2014-05-08 DIAGNOSIS — M1712 Unilateral primary osteoarthritis, left knee: Secondary | ICD-10-CM | POA: Diagnosis not present

## 2014-05-08 DIAGNOSIS — Z471 Aftercare following joint replacement surgery: Secondary | ICD-10-CM | POA: Diagnosis not present

## 2014-05-09 DIAGNOSIS — M1712 Unilateral primary osteoarthritis, left knee: Secondary | ICD-10-CM | POA: Diagnosis not present

## 2014-05-09 DIAGNOSIS — I10 Essential (primary) hypertension: Secondary | ICD-10-CM | POA: Diagnosis not present

## 2014-05-09 DIAGNOSIS — Z471 Aftercare following joint replacement surgery: Secondary | ICD-10-CM | POA: Diagnosis not present

## 2014-05-09 DIAGNOSIS — Z96652 Presence of left artificial knee joint: Secondary | ICD-10-CM | POA: Diagnosis not present

## 2014-05-09 DIAGNOSIS — S83222D Peripheral tear of medial meniscus, current injury, left knee, subsequent encounter: Secondary | ICD-10-CM | POA: Diagnosis not present

## 2014-05-10 DIAGNOSIS — Z471 Aftercare following joint replacement surgery: Secondary | ICD-10-CM | POA: Diagnosis not present

## 2014-05-10 DIAGNOSIS — Z96652 Presence of left artificial knee joint: Secondary | ICD-10-CM | POA: Diagnosis not present

## 2014-05-10 DIAGNOSIS — S83222D Peripheral tear of medial meniscus, current injury, left knee, subsequent encounter: Secondary | ICD-10-CM | POA: Diagnosis not present

## 2014-05-10 DIAGNOSIS — M1712 Unilateral primary osteoarthritis, left knee: Secondary | ICD-10-CM | POA: Diagnosis not present

## 2014-05-10 DIAGNOSIS — I10 Essential (primary) hypertension: Secondary | ICD-10-CM | POA: Diagnosis not present

## 2014-05-11 DIAGNOSIS — M1712 Unilateral primary osteoarthritis, left knee: Secondary | ICD-10-CM | POA: Diagnosis not present

## 2014-05-11 DIAGNOSIS — S83222D Peripheral tear of medial meniscus, current injury, left knee, subsequent encounter: Secondary | ICD-10-CM | POA: Diagnosis not present

## 2014-05-11 DIAGNOSIS — Z471 Aftercare following joint replacement surgery: Secondary | ICD-10-CM | POA: Diagnosis not present

## 2014-05-11 DIAGNOSIS — I10 Essential (primary) hypertension: Secondary | ICD-10-CM | POA: Diagnosis not present

## 2014-05-11 DIAGNOSIS — Z96652 Presence of left artificial knee joint: Secondary | ICD-10-CM | POA: Diagnosis not present

## 2014-05-14 DIAGNOSIS — Z96652 Presence of left artificial knee joint: Secondary | ICD-10-CM | POA: Diagnosis not present

## 2014-05-14 DIAGNOSIS — M25562 Pain in left knee: Secondary | ICD-10-CM | POA: Diagnosis not present

## 2014-05-14 DIAGNOSIS — M1712 Unilateral primary osteoarthritis, left knee: Secondary | ICD-10-CM | POA: Diagnosis not present

## 2014-05-14 DIAGNOSIS — R531 Weakness: Secondary | ICD-10-CM | POA: Diagnosis not present

## 2014-05-16 DIAGNOSIS — M1712 Unilateral primary osteoarthritis, left knee: Secondary | ICD-10-CM | POA: Diagnosis not present

## 2014-05-16 DIAGNOSIS — Z96652 Presence of left artificial knee joint: Secondary | ICD-10-CM | POA: Diagnosis not present

## 2014-05-16 DIAGNOSIS — R531 Weakness: Secondary | ICD-10-CM | POA: Diagnosis not present

## 2014-05-16 DIAGNOSIS — M25562 Pain in left knee: Secondary | ICD-10-CM | POA: Diagnosis not present

## 2014-05-17 ENCOUNTER — Ambulatory Visit: Payer: BC Managed Care – PPO | Admitting: Obstetrics & Gynecology

## 2014-05-18 DIAGNOSIS — R531 Weakness: Secondary | ICD-10-CM | POA: Diagnosis not present

## 2014-05-18 DIAGNOSIS — M1712 Unilateral primary osteoarthritis, left knee: Secondary | ICD-10-CM | POA: Diagnosis not present

## 2014-05-18 DIAGNOSIS — M25562 Pain in left knee: Secondary | ICD-10-CM | POA: Diagnosis not present

## 2014-05-18 DIAGNOSIS — Z96652 Presence of left artificial knee joint: Secondary | ICD-10-CM | POA: Diagnosis not present

## 2014-05-21 DIAGNOSIS — M1712 Unilateral primary osteoarthritis, left knee: Secondary | ICD-10-CM | POA: Diagnosis not present

## 2014-05-21 DIAGNOSIS — Z96652 Presence of left artificial knee joint: Secondary | ICD-10-CM | POA: Diagnosis not present

## 2014-05-21 DIAGNOSIS — R531 Weakness: Secondary | ICD-10-CM | POA: Diagnosis not present

## 2014-05-21 DIAGNOSIS — M25562 Pain in left knee: Secondary | ICD-10-CM | POA: Diagnosis not present

## 2014-05-23 DIAGNOSIS — Z96652 Presence of left artificial knee joint: Secondary | ICD-10-CM | POA: Diagnosis not present

## 2014-05-23 DIAGNOSIS — M25562 Pain in left knee: Secondary | ICD-10-CM | POA: Diagnosis not present

## 2014-05-23 DIAGNOSIS — M1712 Unilateral primary osteoarthritis, left knee: Secondary | ICD-10-CM | POA: Diagnosis not present

## 2014-05-23 DIAGNOSIS — R531 Weakness: Secondary | ICD-10-CM | POA: Diagnosis not present

## 2014-05-25 DIAGNOSIS — R531 Weakness: Secondary | ICD-10-CM | POA: Diagnosis not present

## 2014-05-25 DIAGNOSIS — M25562 Pain in left knee: Secondary | ICD-10-CM | POA: Diagnosis not present

## 2014-05-25 DIAGNOSIS — M1712 Unilateral primary osteoarthritis, left knee: Secondary | ICD-10-CM | POA: Diagnosis not present

## 2014-05-25 DIAGNOSIS — Z96652 Presence of left artificial knee joint: Secondary | ICD-10-CM | POA: Diagnosis not present

## 2014-05-28 DIAGNOSIS — M25562 Pain in left knee: Secondary | ICD-10-CM | POA: Diagnosis not present

## 2014-05-28 DIAGNOSIS — R531 Weakness: Secondary | ICD-10-CM | POA: Diagnosis not present

## 2014-05-28 DIAGNOSIS — Z96652 Presence of left artificial knee joint: Secondary | ICD-10-CM | POA: Diagnosis not present

## 2014-05-28 DIAGNOSIS — M1712 Unilateral primary osteoarthritis, left knee: Secondary | ICD-10-CM | POA: Diagnosis not present

## 2014-05-30 DIAGNOSIS — M25562 Pain in left knee: Secondary | ICD-10-CM | POA: Diagnosis not present

## 2014-05-30 DIAGNOSIS — R531 Weakness: Secondary | ICD-10-CM | POA: Diagnosis not present

## 2014-05-30 DIAGNOSIS — Z96652 Presence of left artificial knee joint: Secondary | ICD-10-CM | POA: Diagnosis not present

## 2014-05-30 DIAGNOSIS — M1712 Unilateral primary osteoarthritis, left knee: Secondary | ICD-10-CM | POA: Diagnosis not present

## 2014-06-01 DIAGNOSIS — M25562 Pain in left knee: Secondary | ICD-10-CM | POA: Diagnosis not present

## 2014-06-01 DIAGNOSIS — M1712 Unilateral primary osteoarthritis, left knee: Secondary | ICD-10-CM | POA: Diagnosis not present

## 2014-06-01 DIAGNOSIS — R531 Weakness: Secondary | ICD-10-CM | POA: Diagnosis not present

## 2014-06-01 DIAGNOSIS — Z96652 Presence of left artificial knee joint: Secondary | ICD-10-CM | POA: Diagnosis not present

## 2014-06-04 DIAGNOSIS — H40033 Anatomical narrow angle, bilateral: Secondary | ICD-10-CM | POA: Diagnosis not present

## 2014-06-05 DIAGNOSIS — Z96652 Presence of left artificial knee joint: Secondary | ICD-10-CM | POA: Diagnosis not present

## 2014-06-05 DIAGNOSIS — M1712 Unilateral primary osteoarthritis, left knee: Secondary | ICD-10-CM | POA: Diagnosis not present

## 2014-06-05 DIAGNOSIS — M531 Cervicobrachial syndrome: Secondary | ICD-10-CM | POA: Diagnosis not present

## 2014-06-05 DIAGNOSIS — Z471 Aftercare following joint replacement surgery: Secondary | ICD-10-CM | POA: Diagnosis not present

## 2014-06-05 DIAGNOSIS — M25562 Pain in left knee: Secondary | ICD-10-CM | POA: Diagnosis not present

## 2014-06-05 DIAGNOSIS — R531 Weakness: Secondary | ICD-10-CM | POA: Diagnosis not present

## 2014-06-06 DIAGNOSIS — R531 Weakness: Secondary | ICD-10-CM | POA: Diagnosis not present

## 2014-06-06 DIAGNOSIS — M531 Cervicobrachial syndrome: Secondary | ICD-10-CM | POA: Diagnosis not present

## 2014-06-06 DIAGNOSIS — Z471 Aftercare following joint replacement surgery: Secondary | ICD-10-CM | POA: Diagnosis not present

## 2014-06-06 DIAGNOSIS — M1712 Unilateral primary osteoarthritis, left knee: Secondary | ICD-10-CM | POA: Diagnosis not present

## 2014-06-06 DIAGNOSIS — Z96652 Presence of left artificial knee joint: Secondary | ICD-10-CM | POA: Diagnosis not present

## 2014-06-06 DIAGNOSIS — M25562 Pain in left knee: Secondary | ICD-10-CM | POA: Diagnosis not present

## 2014-06-08 DIAGNOSIS — R531 Weakness: Secondary | ICD-10-CM | POA: Diagnosis not present

## 2014-06-08 DIAGNOSIS — M1712 Unilateral primary osteoarthritis, left knee: Secondary | ICD-10-CM | POA: Diagnosis not present

## 2014-06-08 DIAGNOSIS — Z96652 Presence of left artificial knee joint: Secondary | ICD-10-CM | POA: Diagnosis not present

## 2014-06-08 DIAGNOSIS — Z471 Aftercare following joint replacement surgery: Secondary | ICD-10-CM | POA: Diagnosis not present

## 2014-06-08 DIAGNOSIS — M531 Cervicobrachial syndrome: Secondary | ICD-10-CM | POA: Diagnosis not present

## 2014-06-08 DIAGNOSIS — M25562 Pain in left knee: Secondary | ICD-10-CM | POA: Diagnosis not present

## 2014-06-11 DIAGNOSIS — Z96652 Presence of left artificial knee joint: Secondary | ICD-10-CM | POA: Diagnosis not present

## 2014-06-11 DIAGNOSIS — M1712 Unilateral primary osteoarthritis, left knee: Secondary | ICD-10-CM | POA: Diagnosis not present

## 2014-06-11 DIAGNOSIS — M25562 Pain in left knee: Secondary | ICD-10-CM | POA: Diagnosis not present

## 2014-06-11 DIAGNOSIS — R531 Weakness: Secondary | ICD-10-CM | POA: Diagnosis not present

## 2014-06-13 DIAGNOSIS — M1712 Unilateral primary osteoarthritis, left knee: Secondary | ICD-10-CM | POA: Diagnosis not present

## 2014-06-13 DIAGNOSIS — Z96652 Presence of left artificial knee joint: Secondary | ICD-10-CM | POA: Diagnosis not present

## 2014-06-13 DIAGNOSIS — R531 Weakness: Secondary | ICD-10-CM | POA: Diagnosis not present

## 2014-06-13 DIAGNOSIS — M25562 Pain in left knee: Secondary | ICD-10-CM | POA: Diagnosis not present

## 2014-06-19 DIAGNOSIS — R531 Weakness: Secondary | ICD-10-CM | POA: Diagnosis not present

## 2014-06-19 DIAGNOSIS — Z96652 Presence of left artificial knee joint: Secondary | ICD-10-CM | POA: Diagnosis not present

## 2014-06-19 DIAGNOSIS — M25562 Pain in left knee: Secondary | ICD-10-CM | POA: Diagnosis not present

## 2014-06-19 DIAGNOSIS — M1712 Unilateral primary osteoarthritis, left knee: Secondary | ICD-10-CM | POA: Diagnosis not present

## 2014-06-21 DIAGNOSIS — M25562 Pain in left knee: Secondary | ICD-10-CM | POA: Diagnosis not present

## 2014-06-21 DIAGNOSIS — Z96652 Presence of left artificial knee joint: Secondary | ICD-10-CM | POA: Diagnosis not present

## 2014-06-21 DIAGNOSIS — M1712 Unilateral primary osteoarthritis, left knee: Secondary | ICD-10-CM | POA: Diagnosis not present

## 2014-06-21 DIAGNOSIS — R531 Weakness: Secondary | ICD-10-CM | POA: Diagnosis not present

## 2014-06-26 DIAGNOSIS — M25562 Pain in left knee: Secondary | ICD-10-CM | POA: Diagnosis not present

## 2014-06-26 DIAGNOSIS — R531 Weakness: Secondary | ICD-10-CM | POA: Diagnosis not present

## 2014-06-26 DIAGNOSIS — M1712 Unilateral primary osteoarthritis, left knee: Secondary | ICD-10-CM | POA: Diagnosis not present

## 2014-06-26 DIAGNOSIS — Z96652 Presence of left artificial knee joint: Secondary | ICD-10-CM | POA: Diagnosis not present

## 2014-06-28 DIAGNOSIS — M1712 Unilateral primary osteoarthritis, left knee: Secondary | ICD-10-CM | POA: Diagnosis not present

## 2014-06-28 DIAGNOSIS — Z96652 Presence of left artificial knee joint: Secondary | ICD-10-CM | POA: Diagnosis not present

## 2014-06-28 DIAGNOSIS — R531 Weakness: Secondary | ICD-10-CM | POA: Diagnosis not present

## 2014-06-28 DIAGNOSIS — M25562 Pain in left knee: Secondary | ICD-10-CM | POA: Diagnosis not present

## 2014-07-03 DIAGNOSIS — R531 Weakness: Secondary | ICD-10-CM | POA: Diagnosis not present

## 2014-07-03 DIAGNOSIS — M25562 Pain in left knee: Secondary | ICD-10-CM | POA: Diagnosis not present

## 2014-07-03 DIAGNOSIS — M1712 Unilateral primary osteoarthritis, left knee: Secondary | ICD-10-CM | POA: Diagnosis not present

## 2014-07-03 DIAGNOSIS — Z96652 Presence of left artificial knee joint: Secondary | ICD-10-CM | POA: Diagnosis not present

## 2014-07-05 DIAGNOSIS — R531 Weakness: Secondary | ICD-10-CM | POA: Diagnosis not present

## 2014-07-05 DIAGNOSIS — Z96652 Presence of left artificial knee joint: Secondary | ICD-10-CM | POA: Diagnosis not present

## 2014-07-05 DIAGNOSIS — M25562 Pain in left knee: Secondary | ICD-10-CM | POA: Diagnosis not present

## 2014-07-05 DIAGNOSIS — M1712 Unilateral primary osteoarthritis, left knee: Secondary | ICD-10-CM | POA: Diagnosis not present

## 2014-07-16 ENCOUNTER — Encounter: Payer: Self-pay | Admitting: *Deleted

## 2014-07-17 ENCOUNTER — Encounter: Payer: Self-pay | Admitting: Obstetrics & Gynecology

## 2014-08-06 DIAGNOSIS — Z09 Encounter for follow-up examination after completed treatment for conditions other than malignant neoplasm: Secondary | ICD-10-CM | POA: Diagnosis not present

## 2014-08-06 DIAGNOSIS — N6001 Solitary cyst of right breast: Secondary | ICD-10-CM | POA: Diagnosis not present

## 2014-08-06 DIAGNOSIS — R928 Other abnormal and inconclusive findings on diagnostic imaging of breast: Secondary | ICD-10-CM | POA: Diagnosis not present

## 2014-08-15 DIAGNOSIS — Z96652 Presence of left artificial knee joint: Secondary | ICD-10-CM | POA: Diagnosis not present

## 2014-09-25 DIAGNOSIS — I10 Essential (primary) hypertension: Secondary | ICD-10-CM | POA: Diagnosis not present

## 2014-10-10 DIAGNOSIS — L509 Urticaria, unspecified: Secondary | ICD-10-CM | POA: Diagnosis not present

## 2014-10-25 DIAGNOSIS — H40033 Anatomical narrow angle, bilateral: Secondary | ICD-10-CM | POA: Diagnosis not present

## 2015-01-07 DIAGNOSIS — M47896 Other spondylosis, lumbar region: Secondary | ICD-10-CM | POA: Diagnosis not present

## 2015-01-07 DIAGNOSIS — M47897 Other spondylosis, lumbosacral region: Secondary | ICD-10-CM | POA: Diagnosis not present

## 2015-01-07 DIAGNOSIS — M5136 Other intervertebral disc degeneration, lumbar region: Secondary | ICD-10-CM | POA: Diagnosis not present

## 2015-01-07 DIAGNOSIS — M4316 Spondylolisthesis, lumbar region: Secondary | ICD-10-CM | POA: Diagnosis not present

## 2015-01-07 DIAGNOSIS — M5124 Other intervertebral disc displacement, thoracic region: Secondary | ICD-10-CM | POA: Diagnosis not present

## 2015-01-07 DIAGNOSIS — Z9889 Other specified postprocedural states: Secondary | ICD-10-CM | POA: Diagnosis not present

## 2015-01-07 DIAGNOSIS — M4806 Spinal stenosis, lumbar region: Secondary | ICD-10-CM | POA: Diagnosis not present

## 2015-01-07 DIAGNOSIS — Z981 Arthrodesis status: Secondary | ICD-10-CM | POA: Diagnosis not present

## 2015-01-14 ENCOUNTER — Other Ambulatory Visit: Payer: Self-pay

## 2015-01-28 DIAGNOSIS — N6001 Solitary cyst of right breast: Secondary | ICD-10-CM | POA: Diagnosis not present

## 2015-01-28 DIAGNOSIS — Z1231 Encounter for screening mammogram for malignant neoplasm of breast: Secondary | ICD-10-CM | POA: Diagnosis not present

## 2015-01-28 DIAGNOSIS — Z129 Encounter for screening for malignant neoplasm, site unspecified: Secondary | ICD-10-CM | POA: Diagnosis not present

## 2015-02-07 ENCOUNTER — Encounter: Payer: Self-pay | Admitting: *Deleted

## 2015-02-18 DIAGNOSIS — M4316 Spondylolisthesis, lumbar region: Secondary | ICD-10-CM | POA: Diagnosis not present

## 2015-02-18 DIAGNOSIS — M79606 Pain in leg, unspecified: Secondary | ICD-10-CM | POA: Diagnosis not present

## 2015-02-18 DIAGNOSIS — M549 Dorsalgia, unspecified: Secondary | ICD-10-CM | POA: Diagnosis not present

## 2015-02-27 DIAGNOSIS — I700 Atherosclerosis of aorta: Secondary | ICD-10-CM | POA: Diagnosis not present

## 2015-02-27 DIAGNOSIS — R05 Cough: Secondary | ICD-10-CM | POA: Diagnosis not present

## 2015-02-27 DIAGNOSIS — Z0000 Encounter for general adult medical examination without abnormal findings: Secondary | ICD-10-CM | POA: Diagnosis not present

## 2015-03-15 ENCOUNTER — Encounter: Payer: Self-pay | Admitting: Cardiovascular Disease

## 2015-03-15 DIAGNOSIS — M545 Low back pain: Secondary | ICD-10-CM | POA: Diagnosis not present

## 2015-03-15 DIAGNOSIS — Z79899 Other long term (current) drug therapy: Secondary | ICD-10-CM | POA: Diagnosis not present

## 2015-03-15 DIAGNOSIS — Z5181 Encounter for therapeutic drug level monitoring: Secondary | ICD-10-CM | POA: Diagnosis not present

## 2015-03-15 DIAGNOSIS — M791 Myalgia: Secondary | ICD-10-CM | POA: Diagnosis not present

## 2015-03-15 DIAGNOSIS — G8929 Other chronic pain: Secondary | ICD-10-CM | POA: Diagnosis not present

## 2015-03-15 DIAGNOSIS — Z9889 Other specified postprocedural states: Secondary | ICD-10-CM | POA: Diagnosis not present

## 2015-03-15 DIAGNOSIS — M533 Sacrococcygeal disorders, not elsewhere classified: Secondary | ICD-10-CM | POA: Diagnosis not present

## 2015-03-18 DIAGNOSIS — H35041 Retinal micro-aneurysms, unspecified, right eye: Secondary | ICD-10-CM | POA: Diagnosis not present

## 2015-03-18 DIAGNOSIS — H35033 Hypertensive retinopathy, bilateral: Secondary | ICD-10-CM | POA: Diagnosis not present

## 2015-03-18 DIAGNOSIS — H26492 Other secondary cataract, left eye: Secondary | ICD-10-CM | POA: Diagnosis not present

## 2015-03-18 DIAGNOSIS — Z961 Presence of intraocular lens: Secondary | ICD-10-CM | POA: Diagnosis not present

## 2015-03-18 DIAGNOSIS — H40033 Anatomical narrow angle, bilateral: Secondary | ICD-10-CM | POA: Diagnosis not present

## 2015-03-22 DIAGNOSIS — Z0001 Encounter for general adult medical examination with abnormal findings: Secondary | ICD-10-CM | POA: Diagnosis not present

## 2015-03-22 DIAGNOSIS — E780 Pure hypercholesterolemia: Secondary | ICD-10-CM | POA: Diagnosis not present

## 2015-03-22 DIAGNOSIS — I10 Essential (primary) hypertension: Secondary | ICD-10-CM | POA: Diagnosis not present

## 2015-03-22 DIAGNOSIS — Z0000 Encounter for general adult medical examination without abnormal findings: Secondary | ICD-10-CM | POA: Diagnosis not present

## 2015-03-29 DIAGNOSIS — Z0000 Encounter for general adult medical examination without abnormal findings: Secondary | ICD-10-CM | POA: Diagnosis not present

## 2015-03-29 DIAGNOSIS — Z01419 Encounter for gynecological examination (general) (routine) without abnormal findings: Secondary | ICD-10-CM | POA: Diagnosis not present

## 2015-03-29 DIAGNOSIS — Z1212 Encounter for screening for malignant neoplasm of rectum: Secondary | ICD-10-CM | POA: Diagnosis not present

## 2015-04-25 DIAGNOSIS — M461 Sacroiliitis, not elsewhere classified: Secondary | ICD-10-CM | POA: Diagnosis not present

## 2015-04-25 DIAGNOSIS — G894 Chronic pain syndrome: Secondary | ICD-10-CM | POA: Diagnosis not present

## 2015-04-25 DIAGNOSIS — M533 Sacrococcygeal disorders, not elsewhere classified: Secondary | ICD-10-CM | POA: Diagnosis not present

## 2015-05-08 DIAGNOSIS — M25561 Pain in right knee: Secondary | ICD-10-CM | POA: Diagnosis not present

## 2015-05-08 DIAGNOSIS — M170 Bilateral primary osteoarthritis of knee: Secondary | ICD-10-CM | POA: Diagnosis not present

## 2015-05-08 DIAGNOSIS — M1711 Unilateral primary osteoarthritis, right knee: Secondary | ICD-10-CM | POA: Diagnosis not present

## 2015-05-08 DIAGNOSIS — R262 Difficulty in walking, not elsewhere classified: Secondary | ICD-10-CM | POA: Diagnosis not present

## 2015-05-08 DIAGNOSIS — M25562 Pain in left knee: Secondary | ICD-10-CM | POA: Diagnosis not present

## 2015-05-14 DIAGNOSIS — M1711 Unilateral primary osteoarthritis, right knee: Secondary | ICD-10-CM | POA: Diagnosis not present

## 2015-05-14 DIAGNOSIS — M25561 Pain in right knee: Secondary | ICD-10-CM | POA: Diagnosis not present

## 2015-05-15 DIAGNOSIS — M25562 Pain in left knee: Secondary | ICD-10-CM | POA: Diagnosis not present

## 2015-05-15 DIAGNOSIS — M1712 Unilateral primary osteoarthritis, left knee: Secondary | ICD-10-CM | POA: Diagnosis not present

## 2015-05-20 DIAGNOSIS — M25561 Pain in right knee: Secondary | ICD-10-CM | POA: Diagnosis not present

## 2015-05-20 DIAGNOSIS — M1711 Unilateral primary osteoarthritis, right knee: Secondary | ICD-10-CM | POA: Diagnosis not present

## 2015-05-21 DIAGNOSIS — R252 Cramp and spasm: Secondary | ICD-10-CM | POA: Diagnosis not present

## 2015-05-21 DIAGNOSIS — E559 Vitamin D deficiency, unspecified: Secondary | ICD-10-CM | POA: Diagnosis not present

## 2015-05-21 DIAGNOSIS — M545 Low back pain: Secondary | ICD-10-CM | POA: Diagnosis not present

## 2015-05-21 DIAGNOSIS — G2581 Restless legs syndrome: Secondary | ICD-10-CM | POA: Diagnosis not present

## 2015-05-23 DIAGNOSIS — M25562 Pain in left knee: Secondary | ICD-10-CM | POA: Diagnosis not present

## 2015-05-23 DIAGNOSIS — M1712 Unilateral primary osteoarthritis, left knee: Secondary | ICD-10-CM | POA: Diagnosis not present

## 2015-05-28 DIAGNOSIS — M25561 Pain in right knee: Secondary | ICD-10-CM | POA: Diagnosis not present

## 2015-05-28 DIAGNOSIS — M1711 Unilateral primary osteoarthritis, right knee: Secondary | ICD-10-CM | POA: Diagnosis not present

## 2015-05-30 DIAGNOSIS — M1712 Unilateral primary osteoarthritis, left knee: Secondary | ICD-10-CM | POA: Diagnosis not present

## 2015-05-30 DIAGNOSIS — M25562 Pain in left knee: Secondary | ICD-10-CM | POA: Diagnosis not present

## 2015-06-04 DIAGNOSIS — R252 Cramp and spasm: Secondary | ICD-10-CM | POA: Diagnosis not present

## 2015-06-04 DIAGNOSIS — G2581 Restless legs syndrome: Secondary | ICD-10-CM | POA: Diagnosis not present

## 2015-06-04 DIAGNOSIS — M25561 Pain in right knee: Secondary | ICD-10-CM | POA: Diagnosis not present

## 2015-06-04 DIAGNOSIS — M1711 Unilateral primary osteoarthritis, right knee: Secondary | ICD-10-CM | POA: Diagnosis not present

## 2015-06-06 DIAGNOSIS — M25562 Pain in left knee: Secondary | ICD-10-CM | POA: Diagnosis not present

## 2015-06-06 DIAGNOSIS — M1712 Unilateral primary osteoarthritis, left knee: Secondary | ICD-10-CM | POA: Diagnosis not present

## 2015-09-23 DIAGNOSIS — J4521 Mild intermittent asthma with (acute) exacerbation: Secondary | ICD-10-CM | POA: Diagnosis not present

## 2015-10-02 DIAGNOSIS — R252 Cramp and spasm: Secondary | ICD-10-CM | POA: Diagnosis not present

## 2015-10-02 DIAGNOSIS — G2581 Restless legs syndrome: Secondary | ICD-10-CM | POA: Diagnosis not present

## 2015-10-02 DIAGNOSIS — M79604 Pain in right leg: Secondary | ICD-10-CM | POA: Diagnosis not present

## 2015-10-02 DIAGNOSIS — M4726 Other spondylosis with radiculopathy, lumbar region: Secondary | ICD-10-CM | POA: Diagnosis not present

## 2015-10-02 DIAGNOSIS — M9903 Segmental and somatic dysfunction of lumbar region: Secondary | ICD-10-CM | POA: Diagnosis not present

## 2015-10-03 DIAGNOSIS — M4726 Other spondylosis with radiculopathy, lumbar region: Secondary | ICD-10-CM | POA: Diagnosis not present

## 2015-10-03 DIAGNOSIS — M9903 Segmental and somatic dysfunction of lumbar region: Secondary | ICD-10-CM | POA: Diagnosis not present

## 2015-10-07 DIAGNOSIS — M4726 Other spondylosis with radiculopathy, lumbar region: Secondary | ICD-10-CM | POA: Diagnosis not present

## 2015-10-07 DIAGNOSIS — M9903 Segmental and somatic dysfunction of lumbar region: Secondary | ICD-10-CM | POA: Diagnosis not present

## 2015-10-07 DIAGNOSIS — H40033 Anatomical narrow angle, bilateral: Secondary | ICD-10-CM | POA: Diagnosis not present

## 2015-10-08 DIAGNOSIS — M9903 Segmental and somatic dysfunction of lumbar region: Secondary | ICD-10-CM | POA: Diagnosis not present

## 2015-10-08 DIAGNOSIS — M4726 Other spondylosis with radiculopathy, lumbar region: Secondary | ICD-10-CM | POA: Diagnosis not present

## 2015-10-09 DIAGNOSIS — M4726 Other spondylosis with radiculopathy, lumbar region: Secondary | ICD-10-CM | POA: Diagnosis not present

## 2015-10-09 DIAGNOSIS — M9903 Segmental and somatic dysfunction of lumbar region: Secondary | ICD-10-CM | POA: Diagnosis not present

## 2015-10-14 DIAGNOSIS — M4726 Other spondylosis with radiculopathy, lumbar region: Secondary | ICD-10-CM | POA: Diagnosis not present

## 2015-10-14 DIAGNOSIS — M9903 Segmental and somatic dysfunction of lumbar region: Secondary | ICD-10-CM | POA: Diagnosis not present

## 2015-10-15 DIAGNOSIS — M9903 Segmental and somatic dysfunction of lumbar region: Secondary | ICD-10-CM | POA: Diagnosis not present

## 2015-10-15 DIAGNOSIS — M4726 Other spondylosis with radiculopathy, lumbar region: Secondary | ICD-10-CM | POA: Diagnosis not present

## 2015-10-16 DIAGNOSIS — M4726 Other spondylosis with radiculopathy, lumbar region: Secondary | ICD-10-CM | POA: Diagnosis not present

## 2015-10-16 DIAGNOSIS — M9903 Segmental and somatic dysfunction of lumbar region: Secondary | ICD-10-CM | POA: Diagnosis not present

## 2015-10-21 DIAGNOSIS — M9903 Segmental and somatic dysfunction of lumbar region: Secondary | ICD-10-CM | POA: Diagnosis not present

## 2015-10-21 DIAGNOSIS — M4726 Other spondylosis with radiculopathy, lumbar region: Secondary | ICD-10-CM | POA: Diagnosis not present

## 2015-10-22 DIAGNOSIS — M9903 Segmental and somatic dysfunction of lumbar region: Secondary | ICD-10-CM | POA: Diagnosis not present

## 2015-10-22 DIAGNOSIS — M4726 Other spondylosis with radiculopathy, lumbar region: Secondary | ICD-10-CM | POA: Diagnosis not present

## 2015-10-23 DIAGNOSIS — M4726 Other spondylosis with radiculopathy, lumbar region: Secondary | ICD-10-CM | POA: Diagnosis not present

## 2015-10-23 DIAGNOSIS — M9903 Segmental and somatic dysfunction of lumbar region: Secondary | ICD-10-CM | POA: Diagnosis not present

## 2015-10-28 DIAGNOSIS — M4726 Other spondylosis with radiculopathy, lumbar region: Secondary | ICD-10-CM | POA: Diagnosis not present

## 2015-10-28 DIAGNOSIS — M9903 Segmental and somatic dysfunction of lumbar region: Secondary | ICD-10-CM | POA: Diagnosis not present

## 2015-10-30 DIAGNOSIS — M9903 Segmental and somatic dysfunction of lumbar region: Secondary | ICD-10-CM | POA: Diagnosis not present

## 2015-10-30 DIAGNOSIS — M4726 Other spondylosis with radiculopathy, lumbar region: Secondary | ICD-10-CM | POA: Diagnosis not present

## 2015-12-04 DIAGNOSIS — M7712 Lateral epicondylitis, left elbow: Secondary | ICD-10-CM | POA: Diagnosis not present

## 2015-12-04 DIAGNOSIS — M2550 Pain in unspecified joint: Secondary | ICD-10-CM | POA: Diagnosis not present

## 2015-12-04 DIAGNOSIS — Z832 Family history of diseases of the blood and blood-forming organs and certain disorders involving the immune mechanism: Secondary | ICD-10-CM | POA: Diagnosis not present

## 2015-12-04 DIAGNOSIS — I10 Essential (primary) hypertension: Secondary | ICD-10-CM | POA: Diagnosis not present

## 2015-12-04 DIAGNOSIS — M170 Bilateral primary osteoarthritis of knee: Secondary | ICD-10-CM | POA: Diagnosis not present

## 2015-12-30 DIAGNOSIS — R600 Localized edema: Secondary | ICD-10-CM | POA: Diagnosis not present

## 2015-12-30 DIAGNOSIS — R062 Wheezing: Secondary | ICD-10-CM | POA: Diagnosis not present

## 2016-01-06 DIAGNOSIS — R600 Localized edema: Secondary | ICD-10-CM | POA: Diagnosis not present

## 2016-01-29 DIAGNOSIS — Z803 Family history of malignant neoplasm of breast: Secondary | ICD-10-CM | POA: Diagnosis not present

## 2016-01-29 DIAGNOSIS — Z1231 Encounter for screening mammogram for malignant neoplasm of breast: Secondary | ICD-10-CM | POA: Diagnosis not present

## 2016-03-18 ENCOUNTER — Other Ambulatory Visit: Payer: Self-pay

## 2016-03-30 DIAGNOSIS — M810 Age-related osteoporosis without current pathological fracture: Secondary | ICD-10-CM | POA: Diagnosis not present

## 2016-03-30 DIAGNOSIS — I10 Essential (primary) hypertension: Secondary | ICD-10-CM | POA: Diagnosis not present

## 2016-03-30 DIAGNOSIS — E559 Vitamin D deficiency, unspecified: Secondary | ICD-10-CM | POA: Diagnosis not present

## 2016-03-30 DIAGNOSIS — E7800 Pure hypercholesterolemia, unspecified: Secondary | ICD-10-CM | POA: Diagnosis not present

## 2016-03-30 DIAGNOSIS — Z0000 Encounter for general adult medical examination without abnormal findings: Secondary | ICD-10-CM | POA: Diagnosis not present

## 2016-04-02 DIAGNOSIS — Z1212 Encounter for screening for malignant neoplasm of rectum: Secondary | ICD-10-CM | POA: Diagnosis not present

## 2016-04-02 DIAGNOSIS — I10 Essential (primary) hypertension: Secondary | ICD-10-CM | POA: Diagnosis not present

## 2016-04-02 DIAGNOSIS — Z01419 Encounter for gynecological examination (general) (routine) without abnormal findings: Secondary | ICD-10-CM | POA: Diagnosis not present

## 2016-04-02 DIAGNOSIS — E559 Vitamin D deficiency, unspecified: Secondary | ICD-10-CM | POA: Diagnosis not present

## 2016-04-02 DIAGNOSIS — M129 Arthropathy, unspecified: Secondary | ICD-10-CM | POA: Diagnosis not present

## 2016-04-06 DIAGNOSIS — M7710 Lateral epicondylitis, unspecified elbow: Secondary | ICD-10-CM | POA: Diagnosis not present

## 2016-04-06 DIAGNOSIS — Z13828 Encounter for screening for other musculoskeletal disorder: Secondary | ICD-10-CM | POA: Diagnosis not present

## 2016-04-06 DIAGNOSIS — M2550 Pain in unspecified joint: Secondary | ICD-10-CM | POA: Diagnosis not present

## 2016-04-06 DIAGNOSIS — M1990 Unspecified osteoarthritis, unspecified site: Secondary | ICD-10-CM | POA: Diagnosis not present

## 2016-04-06 DIAGNOSIS — M19041 Primary osteoarthritis, right hand: Secondary | ICD-10-CM | POA: Diagnosis not present

## 2016-04-06 DIAGNOSIS — M6530 Trigger finger, unspecified finger: Secondary | ICD-10-CM | POA: Diagnosis not present

## 2016-04-20 DIAGNOSIS — M79643 Pain in unspecified hand: Secondary | ICD-10-CM | POA: Diagnosis not present

## 2016-04-20 DIAGNOSIS — M6530 Trigger finger, unspecified finger: Secondary | ICD-10-CM | POA: Diagnosis not present

## 2016-04-20 DIAGNOSIS — M1990 Unspecified osteoarthritis, unspecified site: Secondary | ICD-10-CM | POA: Diagnosis not present

## 2016-04-20 DIAGNOSIS — M2550 Pain in unspecified joint: Secondary | ICD-10-CM | POA: Diagnosis not present

## 2016-05-04 DIAGNOSIS — H40013 Open angle with borderline findings, low risk, bilateral: Secondary | ICD-10-CM | POA: Diagnosis not present

## 2016-05-04 DIAGNOSIS — H3509 Other intraretinal microvascular abnormalities: Secondary | ICD-10-CM | POA: Diagnosis not present

## 2016-05-04 DIAGNOSIS — H40033 Anatomical narrow angle, bilateral: Secondary | ICD-10-CM | POA: Diagnosis not present

## 2016-05-04 DIAGNOSIS — H35363 Drusen (degenerative) of macula, bilateral: Secondary | ICD-10-CM | POA: Diagnosis not present

## 2016-07-20 HISTORY — PX: COLONOSCOPY: SHX174

## 2016-09-17 DIAGNOSIS — M1711 Unilateral primary osteoarthritis, right knee: Secondary | ICD-10-CM | POA: Diagnosis not present

## 2016-09-17 DIAGNOSIS — M79643 Pain in unspecified hand: Secondary | ICD-10-CM | POA: Diagnosis not present

## 2016-09-17 DIAGNOSIS — M1990 Unspecified osteoarthritis, unspecified site: Secondary | ICD-10-CM | POA: Diagnosis not present

## 2016-09-17 DIAGNOSIS — M2550 Pain in unspecified joint: Secondary | ICD-10-CM | POA: Diagnosis not present

## 2016-09-17 DIAGNOSIS — M25569 Pain in unspecified knee: Secondary | ICD-10-CM | POA: Diagnosis not present

## 2016-09-17 DIAGNOSIS — M1712 Unilateral primary osteoarthritis, left knee: Secondary | ICD-10-CM | POA: Diagnosis not present

## 2016-09-30 DIAGNOSIS — I10 Essential (primary) hypertension: Secondary | ICD-10-CM | POA: Diagnosis not present

## 2016-09-30 DIAGNOSIS — R252 Cramp and spasm: Secondary | ICD-10-CM | POA: Diagnosis not present

## 2016-09-30 DIAGNOSIS — M545 Low back pain: Secondary | ICD-10-CM | POA: Diagnosis not present

## 2016-10-28 DIAGNOSIS — H40033 Anatomical narrow angle, bilateral: Secondary | ICD-10-CM | POA: Diagnosis not present

## 2016-10-28 DIAGNOSIS — H40011 Open angle with borderline findings, low risk, right eye: Secondary | ICD-10-CM | POA: Diagnosis not present

## 2016-10-28 DIAGNOSIS — H401222 Low-tension glaucoma, left eye, moderate stage: Secondary | ICD-10-CM | POA: Diagnosis not present

## 2016-11-04 DIAGNOSIS — M5416 Radiculopathy, lumbar region: Secondary | ICD-10-CM | POA: Diagnosis not present

## 2016-11-06 DIAGNOSIS — I10 Essential (primary) hypertension: Secondary | ICD-10-CM | POA: Diagnosis not present

## 2016-11-06 DIAGNOSIS — R252 Cramp and spasm: Secondary | ICD-10-CM | POA: Diagnosis not present

## 2016-11-06 DIAGNOSIS — E559 Vitamin D deficiency, unspecified: Secondary | ICD-10-CM | POA: Diagnosis not present

## 2016-11-06 DIAGNOSIS — L603 Nail dystrophy: Secondary | ICD-10-CM | POA: Diagnosis not present

## 2016-11-11 ENCOUNTER — Other Ambulatory Visit (HOSPITAL_COMMUNITY): Payer: Self-pay | Admitting: Neurological Surgery

## 2016-11-11 ENCOUNTER — Other Ambulatory Visit: Payer: Self-pay | Admitting: Neurological Surgery

## 2016-11-11 DIAGNOSIS — M5416 Radiculopathy, lumbar region: Secondary | ICD-10-CM

## 2016-11-12 DIAGNOSIS — H401222 Low-tension glaucoma, left eye, moderate stage: Secondary | ICD-10-CM | POA: Diagnosis not present

## 2016-11-25 ENCOUNTER — Ambulatory Visit (HOSPITAL_COMMUNITY)
Admission: RE | Admit: 2016-11-25 | Discharge: 2016-11-25 | Disposition: A | Discharge status: Home / Self Care | Payer: Medicare Other | Source: Ambulatory Visit | Attending: Neurological Surgery | Admitting: Neurological Surgery

## 2016-11-25 DIAGNOSIS — M5416 Radiculopathy, lumbar region: Secondary | ICD-10-CM

## 2016-11-25 DIAGNOSIS — M48061 Spinal stenosis, lumbar region without neurogenic claudication: Secondary | ICD-10-CM | POA: Diagnosis not present

## 2016-11-25 DIAGNOSIS — N83201 Unspecified ovarian cyst, right side: Secondary | ICD-10-CM | POA: Diagnosis not present

## 2016-11-25 DIAGNOSIS — M5116 Intervertebral disc disorders with radiculopathy, lumbar region: Secondary | ICD-10-CM | POA: Diagnosis not present

## 2016-11-25 MED ORDER — ONDANSETRON HCL 4 MG/2ML IJ SOLN
4.0000 mg | Freq: Four times a day (QID) | INTRAMUSCULAR | Status: DC | PRN
Start: 2016-11-25 — End: 2016-11-26

## 2016-11-25 MED ORDER — HYDROCODONE-ACETAMINOPHEN 5-325 MG PO TABS
1.0000 | ORAL_TABLET | ORAL | Status: DC | PRN
Start: 2016-11-25 — End: 2016-11-26

## 2016-11-25 MED ORDER — IOPAMIDOL (ISOVUE-M 200) INJECTION 41%
INTRAMUSCULAR | Status: AC
  Administered 2016-11-25: 12 mL via INTRATHECAL
  Filled 2016-11-25: qty 10

## 2016-11-25 MED ORDER — DIAZEPAM 5 MG PO TABS
ORAL_TABLET | ORAL | Status: AC
  Administered 2016-11-25: 10 mg via ORAL
  Filled 2016-11-25: qty 2

## 2016-11-25 MED ORDER — IOPAMIDOL (ISOVUE-M 200) INJECTION 41%
20.0000 mL | Freq: Once | INTRAMUSCULAR | Status: AC
  Administered 2016-11-25: 12 mL via INTRATHECAL

## 2016-11-25 MED ORDER — ONDANSETRON HCL 4 MG/2ML IJ SOLN: 4.0000 mg | Freq: Four times a day (QID) | INTRAMUSCULAR | Status: DC | PRN

## 2016-11-25 MED ORDER — LIDOCAINE HCL (PF) 1 % IJ SOLN
5.0000 mL | Freq: Once | INTRAMUSCULAR | Status: AC
  Administered 2016-11-25: 5 mL via INTRADERMAL

## 2016-11-25 MED ORDER — LIDOCAINE HCL 1 % IJ SOLN
INTRAMUSCULAR | Status: AC
  Filled 2016-11-25: qty 10

## 2016-11-25 MED ORDER — DIAZEPAM 5 MG PO TABS
10.0000 mg | ORAL_TABLET | Freq: Once | ORAL | Status: AC
  Administered 2016-11-25: 10 mg via ORAL

## 2016-11-25 NOTE — Discharge Instructions (Signed)
Myelogram, Care After Refer to this sheet in the next few weeks. These instructions provide you with information about caring for yourself after your procedure. Your health care provider may also give you more specific instructions. Your treatment has been planned according to current medical practices, but problems sometimes occur. Call your health care provider if you have any problems or questions after your procedure. What can I expect after the procedure? After the procedure, it is common to have:  Soreness at your injection site.  A mild headache. Follow these instructions at home:  Drink enough fluid to keep your urine clear or pale yellow. This will help flush out the dye (contrast material) from your spine.  Rest as told by your health care provider. Lie flat with your head slightly raised (elevated) to reduce the risk of headache.  Do not bend, lift, or do any strenuous activity for 24-48 hours or as told by your health care provider.  Take over-the-counter and prescription medicines only as told by your health care provider.  Take care of and remove your bandage (dressing) as told by your health care provider.  Bathe or shower as told by your health care provider. Contact a health care provider if:  You have a fever.  You have a headache that lasts longer than 24 hours.  You feel nauseous or vomit.  You have a stiff neck or numbness in your legs.  You are unable to urinate or have a bowel movement.  You develop a rash, itching, or sneezing. Get help right away if:  You have new symptoms or your symptoms get worse.  You have a seizure.  You have trouble breathing. This information is not intended to replace advice given to you by your health care provider. Make sure you discuss any questions you have with your health care provider. Document Released: 08/02/2015 Document Revised: 12/12/2015 Document Reviewed: 04/18/2015 Elsevier Interactive Patient Education  2017  [employer].

## 2016-11-25 NOTE — Procedures (Signed)
Melissa Maldonado is a 76 year old individual who's had significant issues of back pain in the past. She's had previous decompression fusion from L2 down to L5 and has developed recurrent pain in the back in both lower extremities. It is suspected that she has some adjacent level disease she's had advanced degenerative changes above her fusion.  Pre op Dx: Spondylosis and stenosis of lumbar spine status post fusion L2-L5 Post op Dx: Same Procedure: Lumbar myelogram Surgeon: Keidan Aumiller Puncture level: L3-4 Fluid color: Clear colorless Injection: Isovue-200 12 mL Findings: Adjacent level spondylosis with relative area of stenosis there. Further evaluation with CT scanning

## 2016-11-26 DIAGNOSIS — M5416 Radiculopathy, lumbar region: Secondary | ICD-10-CM | POA: Diagnosis not present

## 2016-12-03 DIAGNOSIS — M79605 Pain in left leg: Secondary | ICD-10-CM | POA: Diagnosis not present

## 2016-12-03 DIAGNOSIS — I10 Essential (primary) hypertension: Secondary | ICD-10-CM | POA: Diagnosis not present

## 2016-12-03 DIAGNOSIS — M79604 Pain in right leg: Secondary | ICD-10-CM | POA: Diagnosis not present

## 2016-12-23 DIAGNOSIS — I10 Essential (primary) hypertension: Secondary | ICD-10-CM | POA: Diagnosis not present

## 2016-12-28 DIAGNOSIS — H40011 Open angle with borderline findings, low risk, right eye: Secondary | ICD-10-CM | POA: Diagnosis not present

## 2016-12-28 DIAGNOSIS — H401222 Low-tension glaucoma, left eye, moderate stage: Secondary | ICD-10-CM | POA: Diagnosis not present

## 2016-12-28 DIAGNOSIS — H40033 Anatomical narrow angle, bilateral: Secondary | ICD-10-CM | POA: Diagnosis not present

## 2016-12-28 DIAGNOSIS — H35033 Hypertensive retinopathy, bilateral: Secondary | ICD-10-CM | POA: Diagnosis not present

## 2016-12-31 ENCOUNTER — Other Ambulatory Visit: Payer: Self-pay | Admitting: Neurological Surgery

## 2017-01-22 ENCOUNTER — Other Ambulatory Visit (HOSPITAL_COMMUNITY): Payer: Self-pay

## 2017-01-22 DIAGNOSIS — I10 Essential (primary) hypertension: Secondary | ICD-10-CM | POA: Diagnosis not present

## 2017-01-22 NOTE — Pre-Procedure Instructions (Signed)
    [place]  01/22/2017      [id number] - [place], [address] [place] [address] Phone: [phone] Fax: [phone]   Your procedure is scheduled on Monday, February 01, 2017 at 7:30 AM.   Report to [facility] Entrance "A" Admitting Office at 5:30 AM.   Call this number if you have problems the morning of surgery: [phone]   Questions prior to day of surgery, please call [phone] between 8 & 4 PM.   Remember:  Do not eat food or drink liquids after midnight Sunday, 01/31/17.  Stop NSAIDS (Ibuprofen, Naprosyn, Aleve, etc) and Multivitamins 5 days prior to surgery. Do not use Aspirin products 5 days prior to surgery.   Do not wear jewelry, make-up or nail polish.  Do not wear lotions, powders, perfumes or deodorant.  Do not shave 48 hours prior to surgery.    Do not bring valuables to the hospital.  [facility] is not responsible for any belongings or valuables.  Contacts, dentures or bridgework may not be worn into surgery.  Leave your suitcase in the car.  After surgery it may be brought to your room.  For patients admitted to the hospital, discharge time will be determined by your treatment team.  Special instructions:  See "Preparing for Surgery" Instruction sheet.   Please read over the fact sheets that you were given.

## 2017-01-25 ENCOUNTER — Encounter (HOSPITAL_COMMUNITY)
Admission: RE | Admit: 2017-01-25 | Discharge: 2017-01-25 | Disposition: A | Discharge status: Home / Self Care | Payer: Medicare Other | Source: Ambulatory Visit | Attending: Neurological Surgery | Admitting: Neurological Surgery

## 2017-01-25 DIAGNOSIS — Z01812 Encounter for preprocedural laboratory examination: Secondary | ICD-10-CM | POA: Insufficient documentation

## 2017-01-25 DIAGNOSIS — I10 Essential (primary) hypertension: Secondary | ICD-10-CM | POA: Diagnosis not present

## 2017-01-25 DIAGNOSIS — Z01810 Encounter for preprocedural cardiovascular examination: Secondary | ICD-10-CM | POA: Diagnosis not present

## 2017-01-25 LAB — SURGICAL PCR SCREEN
MRSA, PCR: NEGATIVE
STAPHYLOCOCCUS AUREUS: NEGATIVE

## 2017-01-25 LAB — BASIC METABOLIC PANEL
Anion gap: 9 (ref 5–15)
BUN: 22 mg/dL — AB (ref 6–20)
CALCIUM: 9.3 mg/dL (ref 8.9–10.3)
CO2: 25 mmol/L (ref 22–32)
CREATININE: 1.32 mg/dL — AB (ref 0.44–1.00)
Chloride: 105 mmol/L (ref 101–111)
GFR calc non Af Amer: 39 mL/min — ABNORMAL LOW (ref >60)
GFR, EST AFRICAN AMERICAN: 45 mL/min — AB (ref >60)
Glucose, Bld: 95 mg/dL (ref 65–99)
Potassium: 4 mmol/L (ref 3.5–5.1)
SODIUM: 139 mmol/L (ref 135–145)

## 2017-01-25 LAB — CBC
HCT: 38.1 % (ref 36.0–46.0)
Hemoglobin: 12.3 g/dL (ref 12.0–15.0)
MCH: 29.9 pg (ref 26.0–34.0)
MCHC: 32.3 g/dL (ref 30.0–36.0)
MCV: 92.5 fL (ref 78.0–100.0)
PLATELETS: 277 10*3/uL (ref 150–400)
RBC: 4.12 MIL/uL (ref 3.87–5.11)
RDW: 13.1 % (ref 11.5–15.5)
WBC: 8.8 10*3/uL (ref 4.0–10.5)

## 2017-01-25 LAB — TYPE AND SCREEN
ABO/RH(D): O POS
ANTIBODY SCREEN: NEGATIVE

## 2017-01-25 MED ORDER — CHLORHEXIDINE GLUCONATE CLOTH 2 % EX PADS: 6.0000 | MEDICATED_PAD | Freq: Once | CUTANEOUS | Status: DC

## 2017-01-29 DIAGNOSIS — Z1231 Encounter for screening mammogram for malignant neoplasm of breast: Secondary | ICD-10-CM | POA: Diagnosis not present

## 2017-01-31 MED ORDER — CEFAZOLIN SODIUM-DEXTROSE 2-4 GM/100ML-% IV SOLN
2.0000 g | INTRAVENOUS | Status: AC
  Administered 2017-02-01: 2 g via INTRAVENOUS
  Filled 2017-01-31: qty 100

## 2017-01-31 NOTE — Anesthesia Preprocedure Evaluation (Addendum)
Anesthesia Evaluation  Patient identified by MRN, date of birth, ID band Patient awake    Reviewed: Allergy & Precautions, NPO status , Patient's Chart, lab work & pertinent test results  History of Anesthesia Complications Negative for: history of anesthetic complications  Airway Mallampati: II  TM Distance: >3 FB Neck ROM: Full    Dental  (+) Partial Upper, Dental Advisory Given, Missing   Pulmonary neg pulmonary ROS,    breath sounds clear to auscultation       Cardiovascular hypertension, Pt. on medications (-) angina Rhythm:Regular Rate:Normal     Neuro/Psych chronic back pain negative psych ROS   GI/Hepatic Neg liver ROS, GERD  Controlled,  Endo/Other  Morbid obesity  Renal/GU negative Renal ROS     Musculoskeletal  (+) Arthritis ,   Abdominal (+) + obese,   Peds  Hematology   Anesthesia Other Findings   Reproductive/Obstetrics                            Anesthesia Physical Anesthesia Plan  ASA: II  Anesthesia Plan: General   Post-op Pain Management:    Induction: Intravenous  PONV Risk Score and Plan: 3 and Ondansetron, Dexamethasone, Midazolam and Diphenhydramine  Airway Management Planned: Oral ETT  Additional Equipment:   Intra-op Plan:   Post-operative Plan: Extubation in OR  Informed Consent: I have reviewed the patients History and Physical, chart, labs and discussed the procedure including the risks, benefits and alternatives for the proposed anesthesia with the patient or authorized representative who has indicated his/her understanding and acceptance.   Dental advisory given  Plan Discussed with: CRNA and Surgeon  Anesthesia Plan Comments: (Plan routine monitors, GETA)        Anesthesia Quick Evaluation

## 2017-02-01 ENCOUNTER — Inpatient Hospital Stay (HOSPITAL_COMMUNITY)
Admission: RE | Admit: 2017-02-01 | Discharge: 2017-02-03 | DRG: 460 | Disposition: A | Discharge status: Home / Self Care | Payer: Medicare Other | Attending: Neurological Surgery | Admitting: Neurological Surgery

## 2017-02-01 ENCOUNTER — Inpatient Hospital Stay (HOSPITAL_COMMUNITY): Payer: Medicare Other

## 2017-02-01 ENCOUNTER — Inpatient Hospital Stay (HOSPITAL_COMMUNITY)
Admission: RE | Disposition: A | Discharge status: Home / Self Care | Payer: Self-pay | Source: Home / Self Care | Attending: Neurological Surgery

## 2017-02-01 ENCOUNTER — Inpatient Hospital Stay (HOSPITAL_COMMUNITY): Payer: Medicare Other | Admitting: Anesthesiology

## 2017-02-01 ENCOUNTER — Inpatient Hospital Stay (HOSPITAL_COMMUNITY): Payer: Medicare Other | Admitting: Emergency Medicine

## 2017-02-01 DIAGNOSIS — I10 Essential (primary) hypertension: Secondary | ICD-10-CM | POA: Diagnosis not present

## 2017-02-01 DIAGNOSIS — Z885 Allergy status to narcotic agent status: Secondary | ICD-10-CM

## 2017-02-01 DIAGNOSIS — G8929 Other chronic pain: Secondary | ICD-10-CM | POA: Diagnosis not present

## 2017-02-01 DIAGNOSIS — M4726 Other spondylosis with radiculopathy, lumbar region: Principal | ICD-10-CM | POA: Diagnosis present

## 2017-02-01 DIAGNOSIS — I959 Hypotension, unspecified: Secondary | ICD-10-CM | POA: Diagnosis not present

## 2017-02-01 DIAGNOSIS — Z981 Arthrodesis status: Secondary | ICD-10-CM | POA: Diagnosis not present

## 2017-02-01 DIAGNOSIS — M48062 Spinal stenosis, lumbar region with neurogenic claudication: Secondary | ICD-10-CM | POA: Diagnosis not present

## 2017-02-01 DIAGNOSIS — D62 Acute posthemorrhagic anemia: Secondary | ICD-10-CM | POA: Diagnosis not present

## 2017-02-01 DIAGNOSIS — Z791 Long term (current) use of non-steroidal anti-inflammatories (NSAID): Secondary | ICD-10-CM | POA: Diagnosis not present

## 2017-02-01 DIAGNOSIS — N289 Disorder of kidney and ureter, unspecified: Secondary | ICD-10-CM | POA: Diagnosis present

## 2017-02-01 DIAGNOSIS — E785 Hyperlipidemia, unspecified: Secondary | ICD-10-CM | POA: Diagnosis not present

## 2017-02-01 DIAGNOSIS — Z886 Allergy status to analgesic agent status: Secondary | ICD-10-CM | POA: Diagnosis not present

## 2017-02-01 DIAGNOSIS — Z86010 Personal history of colonic polyps: Secondary | ICD-10-CM

## 2017-02-01 DIAGNOSIS — Z419 Encounter for procedure for purposes other than remedying health state, unspecified: Secondary | ICD-10-CM

## 2017-02-01 DIAGNOSIS — M1712 Unilateral primary osteoarthritis, left knee: Secondary | ICD-10-CM | POA: Diagnosis not present

## 2017-02-01 DIAGNOSIS — Z79899 Other long term (current) drug therapy: Secondary | ICD-10-CM

## 2017-02-01 DIAGNOSIS — Z6836 Body mass index (BMI) 36.0-36.9, adult: Secondary | ICD-10-CM

## 2017-02-01 DIAGNOSIS — M549 Dorsalgia, unspecified: Secondary | ICD-10-CM | POA: Diagnosis not present

## 2017-02-01 DIAGNOSIS — K219 Gastro-esophageal reflux disease without esophagitis: Secondary | ICD-10-CM | POA: Diagnosis not present

## 2017-02-01 DIAGNOSIS — M4326 Fusion of spine, lumbar region: Secondary | ICD-10-CM | POA: Diagnosis not present

## 2017-02-01 SURGERY — POSTERIOR LUMBAR FUSION 1 LEVEL
Anesthesia: General | Site: Back

## 2017-02-01 MED ORDER — PHENYLEPHRINE HCL 10 MG/ML IJ SOLN
INTRAMUSCULAR | Status: DC | PRN
  Administered 2017-02-01: 120 ug via INTRAVENOUS
  Administered 2017-02-01: 80 ug via INTRAVENOUS

## 2017-02-01 MED ORDER — IRBESARTAN 300 MG PO TABS
300.0000 mg | ORAL_TABLET | Freq: Every day | ORAL | Status: DC
  Administered 2017-02-02 – 2017-02-03 (×2): 300 mg via ORAL
  Filled 2017-02-01 (×2): qty 1

## 2017-02-01 MED ORDER — CEFAZOLIN SODIUM-DEXTROSE 2-4 GM/100ML-% IV SOLN
2.0000 g | Freq: Three times a day (TID) | INTRAVENOUS | Status: AC
  Administered 2017-02-01 (×2): 2 g via INTRAVENOUS
  Filled 2017-02-01 (×2): qty 100

## 2017-02-01 MED ORDER — THROMBIN 5000 UNITS EX SOLR
CUTANEOUS | Status: AC
  Filled 2017-02-01: qty 5000

## 2017-02-01 MED ORDER — MENTHOL 3 MG MT LOZG
1.0000 | LOZENGE | OROMUCOSAL | Status: DC | PRN
  Filled 2017-02-01: qty 9

## 2017-02-01 MED ORDER — ONDANSETRON HCL 4 MG/2ML IJ SOLN: 4.0000 mg | Freq: Four times a day (QID) | INTRAMUSCULAR | Status: DC | PRN

## 2017-02-01 MED ORDER — PHENYLEPHRINE HCL 10 MG/ML IJ SOLN
INTRAMUSCULAR | Status: DC | PRN
  Administered 2017-02-01: 75 ug/min via INTRAVENOUS

## 2017-02-01 MED ORDER — SODIUM CHLORIDE 0.9 % IR SOLN
Status: DC | PRN
  Administered 2017-02-01: 500 mL

## 2017-02-01 MED ORDER — FENTANYL CITRATE (PF) 250 MCG/5ML IJ SOLN
INTRAMUSCULAR | Status: AC
  Filled 2017-02-01: qty 5

## 2017-02-01 MED ORDER — THROMBIN 5000 UNITS EX SOLR
OROMUCOSAL | Status: DC | PRN
  Administered 2017-02-01 (×2): 5 mL via TOPICAL

## 2017-02-01 MED ORDER — SUCCINYLCHOLINE CHLORIDE 200 MG/10ML IV SOSY
PREFILLED_SYRINGE | INTRAVENOUS | Status: AC
  Filled 2017-02-01: qty 10

## 2017-02-01 MED ORDER — LACTATED RINGERS IV SOLN
INTRAVENOUS | Status: DC | PRN
  Administered 2017-02-01 (×3): via INTRAVENOUS

## 2017-02-01 MED ORDER — THROMBIN 20000 UNITS EX SOLR
CUTANEOUS | Status: DC | PRN
  Administered 2017-02-01: 20 mL via TOPICAL

## 2017-02-01 MED ORDER — SENNA 8.6 MG PO TABS
1.0000 | ORAL_TABLET | Freq: Two times a day (BID) | ORAL | Status: DC
  Administered 2017-02-02 – 2017-02-03 (×2): 8.6 mg via ORAL
  Filled 2017-02-01 (×3): qty 1

## 2017-02-01 MED ORDER — POLYETHYLENE GLYCOL 3350 17 G PO PACK: 17.0000 g | PACK | Freq: Every day | ORAL | Status: DC | PRN

## 2017-02-01 MED ORDER — EPHEDRINE SULFATE 50 MG/ML IJ SOLN
INTRAMUSCULAR | Status: DC | PRN
  Administered 2017-02-01: 15 mg via INTRAVENOUS
  Administered 2017-02-01: 20 mg via INTRAVENOUS
  Administered 2017-02-01: 10 mg via INTRAVENOUS

## 2017-02-01 MED ORDER — ONDANSETRON HCL 4 MG/2ML IJ SOLN
INTRAMUSCULAR | Status: AC
Start: 2017-02-01 — End: ?
  Filled 2017-02-01: qty 2

## 2017-02-01 MED ORDER — FENTANYL CITRATE (PF) 100 MCG/2ML IJ SOLN
INTRAMUSCULAR | Status: DC | PRN
  Administered 2017-02-01: 25 ug via INTRAVENOUS
  Administered 2017-02-01 (×2): 50 ug via INTRAVENOUS
  Administered 2017-02-01: 250 ug via INTRAVENOUS

## 2017-02-01 MED ORDER — DEXAMETHASONE SODIUM PHOSPHATE 10 MG/ML IJ SOLN
INTRAMUSCULAR | Status: DC | PRN
  Administered 2017-02-01: 10 mg via INTRAVENOUS

## 2017-02-01 MED ORDER — 0.9 % SODIUM CHLORIDE (POUR BTL) OPTIME
TOPICAL | Status: DC | PRN
  Administered 2017-02-01: 1000 mL

## 2017-02-01 MED ORDER — LIDOCAINE HCL (CARDIAC) 20 MG/ML IV SOLN
INTRAVENOUS | Status: AC
  Filled 2017-02-01: qty 5

## 2017-02-01 MED ORDER — MEPERIDINE HCL 25 MG/ML IJ SOLN: 6.2500 mg | INTRAMUSCULAR | Status: DC | PRN

## 2017-02-01 MED ORDER — LACTATED RINGERS IV SOLN
INTRAVENOUS | Status: DC
  Administered 2017-02-01: 15:00:00 via INTRAVENOUS

## 2017-02-01 MED ORDER — EPHEDRINE 5 MG/ML INJ
INTRAVENOUS | Status: AC
  Filled 2017-02-01: qty 10

## 2017-02-01 MED ORDER — MIDAZOLAM HCL 5 MG/5ML IJ SOLN
INTRAMUSCULAR | Status: DC | PRN
  Administered 2017-02-01: 2 mg via INTRAVENOUS

## 2017-02-01 MED ORDER — ROCURONIUM BROMIDE 100 MG/10ML IV SOLN
INTRAVENOUS | Status: DC | PRN
  Administered 2017-02-01 (×2): 10 mg via INTRAVENOUS
  Administered 2017-02-01: 50 mg via INTRAVENOUS
  Administered 2017-02-01: 20 mg via INTRAVENOUS

## 2017-02-01 MED ORDER — SUGAMMADEX SODIUM 200 MG/2ML IV SOLN
INTRAVENOUS | Status: DC | PRN
  Administered 2017-02-01: 200 mg via INTRAVENOUS

## 2017-02-01 MED ORDER — SODIUM CHLORIDE 0.9% FLUSH
3.0000 mL | Freq: Two times a day (BID) | INTRAVENOUS | Status: DC
  Administered 2017-02-01 – 2017-02-02 (×3): 3 mL via INTRAVENOUS

## 2017-02-01 MED ORDER — LIDOCAINE-EPINEPHRINE 1 %-1:100000 IJ SOLN
INTRAMUSCULAR | Status: DC | PRN
  Administered 2017-02-01: 5 mL

## 2017-02-01 MED ORDER — LIDOCAINE-EPINEPHRINE 1 %-1:100000 IJ SOLN
INTRAMUSCULAR | Status: AC
  Filled 2017-02-01: qty 1

## 2017-02-01 MED ORDER — ROCURONIUM BROMIDE 50 MG/5ML IV SOLN
INTRAVENOUS | Status: AC
  Filled 2017-02-01: qty 1

## 2017-02-01 MED ORDER — GLYCOPYRROLATE 0.2 MG/ML IJ SOLN
INTRAMUSCULAR | Status: DC | PRN
  Administered 2017-02-01: .2 mg via INTRAVENOUS
  Administered 2017-02-01 (×2): 0.2 mg via INTRAVENOUS

## 2017-02-01 MED ORDER — PHENOL 1.4 % MT LIQD: 1.0000 | OROMUCOSAL | Status: DC | PRN

## 2017-02-01 MED ORDER — BUPIVACAINE HCL (PF) 0.5 % IJ SOLN
INTRAMUSCULAR | Status: DC | PRN
  Administered 2017-02-01: 20 mL
  Administered 2017-02-01: 5 mL

## 2017-02-01 MED ORDER — METHOCARBAMOL 1000 MG/10ML IJ SOLN
500.0000 mg | Freq: Four times a day (QID) | INTRAVENOUS | Status: DC | PRN
  Filled 2017-02-01: qty 5

## 2017-02-01 MED ORDER — PHENYLEPHRINE 40 MCG/ML (10ML) SYRINGE FOR IV PUSH (FOR BLOOD PRESSURE SUPPORT)
PREFILLED_SYRINGE | INTRAVENOUS | Status: AC
  Filled 2017-02-01: qty 10

## 2017-02-01 MED ORDER — VALSARTAN-HYDROCHLOROTHIAZIDE 320-25 MG PO TABS: 1.0000 | ORAL_TABLET | Freq: Every day | ORAL | Status: DC

## 2017-02-01 MED ORDER — DEXAMETHASONE SODIUM PHOSPHATE 10 MG/ML IJ SOLN
INTRAMUSCULAR | Status: AC
  Filled 2017-02-01: qty 1

## 2017-02-01 MED ORDER — BUPIVACAINE HCL (PF) 0.5 % IJ SOLN
INTRAMUSCULAR | Status: AC
  Filled 2017-02-01: qty 30

## 2017-02-01 MED ORDER — PROPOFOL 10 MG/ML IV BOLUS
INTRAVENOUS | Status: AC
  Filled 2017-02-01: qty 20

## 2017-02-01 MED ORDER — ALUM & MAG HYDROXIDE-SIMETH 200-200-20 MG/5ML PO SUSP: 30.0000 mL | Freq: Four times a day (QID) | ORAL | Status: DC | PRN

## 2017-02-01 MED ORDER — MIDAZOLAM HCL 2 MG/2ML IJ SOLN: 0.5000 mg | Freq: Once | INTRAMUSCULAR | Status: DC | PRN

## 2017-02-01 MED ORDER — SODIUM CHLORIDE 0.9% FLUSH: 3.0000 mL | INTRAVENOUS | Status: DC | PRN

## 2017-02-01 MED ORDER — DOCUSATE SODIUM 100 MG PO CAPS
100.0000 mg | ORAL_CAPSULE | Freq: Two times a day (BID) | ORAL | Status: DC
  Administered 2017-02-02 – 2017-02-03 (×2): 100 mg via ORAL
  Filled 2017-02-01 (×3): qty 1

## 2017-02-01 MED ORDER — ACETAMINOPHEN 325 MG PO TABS
650.0000 mg | ORAL_TABLET | ORAL | Status: DC | PRN
  Administered 2017-02-01 – 2017-02-03 (×7): 650 mg via ORAL
  Filled 2017-02-01 (×7): qty 2

## 2017-02-01 MED ORDER — THROMBIN 20000 UNITS EX SOLR
CUTANEOUS | Status: AC
  Filled 2017-02-01: qty 20000

## 2017-02-01 MED ORDER — SODIUM CHLORIDE 0.9 % IV SOLN
250.0000 mL | INTRAVENOUS | Status: DC
  Administered 2017-02-01: 250 mL via INTRAVENOUS

## 2017-02-01 MED ORDER — HYDROMORPHONE HCL 1 MG/ML IJ SOLN: 0.2500 mg | INTRAMUSCULAR | Status: DC | PRN

## 2017-02-01 MED ORDER — ACETAMINOPHEN 650 MG RE SUPP: 650.0000 mg | RECTAL | Status: DC | PRN

## 2017-02-01 MED ORDER — METHOCARBAMOL 500 MG PO TABS
500.0000 mg | ORAL_TABLET | Freq: Four times a day (QID) | ORAL | Status: DC | PRN
  Administered 2017-02-01 – 2017-02-03 (×7): 500 mg via ORAL
  Filled 2017-02-01 (×7): qty 1

## 2017-02-01 MED ORDER — PROPOFOL 10 MG/ML IV BOLUS
INTRAVENOUS | Status: DC | PRN
  Administered 2017-02-01: 10 mg via INTRAVENOUS
  Administered 2017-02-01: 100 mg via INTRAVENOUS

## 2017-02-01 MED ORDER — PROMETHAZINE HCL 25 MG/ML IJ SOLN: 6.2500 mg | INTRAMUSCULAR | Status: DC | PRN

## 2017-02-01 MED ORDER — ONDANSETRON HCL 4 MG PO TABS: 4.0000 mg | ORAL_TABLET | Freq: Four times a day (QID) | ORAL | Status: DC | PRN

## 2017-02-01 MED ORDER — FLEET ENEMA 7-19 GM/118ML RE ENEM: 1.0000 | ENEMA | Freq: Once | RECTAL | Status: DC | PRN

## 2017-02-01 MED ORDER — HYDROCHLOROTHIAZIDE 25 MG PO TABS
25.0000 mg | ORAL_TABLET | Freq: Every day | ORAL | Status: DC
  Administered 2017-02-02 – 2017-02-03 (×2): 25 mg via ORAL
  Filled 2017-02-01 (×2): qty 1

## 2017-02-01 MED ORDER — MIDAZOLAM HCL 2 MG/2ML IJ SOLN
INTRAMUSCULAR | Status: AC
  Filled 2017-02-01: qty 2

## 2017-02-01 MED ORDER — HEMOSTATIC AGENTS (NO CHARGE) OPTIME
TOPICAL | Status: DC | PRN
  Administered 2017-02-01 (×3): 1 via TOPICAL

## 2017-02-01 MED ORDER — BISACODYL 10 MG RE SUPP: 10.0000 mg | Freq: Every day | RECTAL | Status: DC | PRN

## 2017-02-01 MED ORDER — LIDOCAINE HCL (CARDIAC) 20 MG/ML IV SOLN
INTRAVENOUS | Status: DC | PRN
  Administered 2017-02-01: 20 mg via INTRAVENOUS

## 2017-02-01 MED ORDER — ROPINIROLE HCL 1 MG PO TABS
0.5000 mg | ORAL_TABLET | Freq: Every day | ORAL | Status: DC
  Administered 2017-02-01 – 2017-02-02 (×2): 0.5 mg via ORAL
  Filled 2017-02-01 (×2): qty 1

## 2017-02-01 MED ORDER — SODIUM CHLORIDE 0.9 % IV SOLN
INTRAVENOUS | Status: DC | PRN
  Administered 2017-02-01: 11:00:00 via INTRAVENOUS

## 2017-02-01 MED ORDER — ONDANSETRON HCL 4 MG/2ML IJ SOLN
INTRAMUSCULAR | Status: DC | PRN
  Administered 2017-02-01: 4 mg via INTRAVENOUS

## 2017-02-01 MED FILL — Heparin Sodium (Porcine) Inj 1000 Unit/ML: INTRAMUSCULAR | Qty: 30 | Status: AC

## 2017-02-01 MED FILL — Sodium Chloride IV Soln 0.9%: INTRAVENOUS | Qty: 2000 | Status: AC

## 2017-02-01 SURGICAL SUPPLY — 79 items
ADH SKN CLS APL DERMABOND .7 (GAUZE/BANDAGES/DRESSINGS) ×1
APL SRG 60D 8 XTD TIP BNDBL (TIP)
BAG DECANTER FOR FLEXI CONT (MISCELLANEOUS) ×3 IMPLANT
BASKET BONE COLLECTION (BASKET) ×3 IMPLANT
BIT DRILL 1.3 (BIT) ×3
BIT DRILL 1.3XMNQK CNCT DISP (BIT) IMPLANT
BIT DRL 1.3XMNQK CNCT DISP (BIT) ×1
BLADE CLIPPER SURG (BLADE) IMPLANT
BUR MATCHSTICK NEURO 3.0 LAGG (BURR) ×5 IMPLANT
CANISTER SUCT 3000ML PPV (MISCELLANEOUS) ×3 IMPLANT
CARTRIDGE OIL MAESTRO DRILL (MISCELLANEOUS) ×1 IMPLANT
CONT SPEC 4OZ CLIKSEAL STRL BL (MISCELLANEOUS) ×3 IMPLANT
COVER BACK TABLE 60X90IN (DRAPES) ×5 IMPLANT
DECANTER SPIKE VIAL GLASS SM (MISCELLANEOUS) ×3 IMPLANT
DERMABOND ADVANCED (GAUZE/BANDAGES/DRESSINGS) ×2
DERMABOND ADVANCED .7 DNX12 (GAUZE/BANDAGES/DRESSINGS) ×1 IMPLANT
DEVICE DISSECT PLASMABLAD 3.0S (MISCELLANEOUS) ×1 IMPLANT
DIFFUSER DRILL AIR PNEUMATIC (MISCELLANEOUS) ×3 IMPLANT
DRAPE C-ARM 42X72 X-RAY (DRAPES) ×6 IMPLANT
DRAPE HALF SHEET 40X57 (DRAPES) IMPLANT
DRAPE LAPAROTOMY 100X72X124 (DRAPES) ×3 IMPLANT
DRAPE POUCH INSTRU U-SHP 10X18 (DRAPES) ×3 IMPLANT
DRSG OPSITE POSTOP 4X6 (GAUZE/BANDAGES/DRESSINGS) ×2 IMPLANT
DURAPREP 26ML APPLICATOR (WOUND CARE) ×3 IMPLANT
DURASEAL APPLICATOR TIP (TIP) IMPLANT
DURASEAL SPINE SEALANT 3ML (MISCELLANEOUS) IMPLANT
ELECT REM PT RETURN 9FT ADLT (ELECTROSURGICAL) ×3
ELECTRODE REM PT RTRN 9FT ADLT (ELECTROSURGICAL) ×1 IMPLANT
GAUZE SPONGE 4X4 12PLY STRL (GAUZE/BANDAGES/DRESSINGS) ×3 IMPLANT
GAUZE SPONGE 4X4 16PLY XRAY LF (GAUZE/BANDAGES/DRESSINGS) IMPLANT
GLOVE BIO SURGEON STRL SZ8 (GLOVE) ×2 IMPLANT
GLOVE BIO SURGEON STRL SZ8.5 (GLOVE) ×8 IMPLANT
GLOVE BIOGEL PI IND STRL 7.0 (GLOVE) IMPLANT
GLOVE BIOGEL PI IND STRL 7.5 (GLOVE) IMPLANT
GLOVE BIOGEL PI IND STRL 8.5 (GLOVE) ×2 IMPLANT
GLOVE BIOGEL PI INDICATOR 7.0 (GLOVE) ×2
GLOVE BIOGEL PI INDICATOR 7.5 (GLOVE) ×10
GLOVE BIOGEL PI INDICATOR 8.5 (GLOVE) ×4
GLOVE ECLIPSE 8.5 STRL (GLOVE) ×6 IMPLANT
GLOVE SS BIOGEL STRL SZ 7 (GLOVE) IMPLANT
GLOVE SUPERSENSE BIOGEL SZ 7 (GLOVE) ×10
GOWN STRL REUS W/ TWL LRG LVL3 (GOWN DISPOSABLE) IMPLANT
GOWN STRL REUS W/ TWL XL LVL3 (GOWN DISPOSABLE) IMPLANT
GOWN STRL REUS W/TWL 2XL LVL3 (GOWN DISPOSABLE) ×6 IMPLANT
GOWN STRL REUS W/TWL LRG LVL3 (GOWN DISPOSABLE) ×3
GOWN STRL REUS W/TWL XL LVL3 (GOWN DISPOSABLE) ×3
GRAFT DURAGEN MATRIX 2WX2L ×2 IMPLANT
HEMOSTAT POWDER KIT SURGIFOAM (HEMOSTASIS) ×4 IMPLANT
KIT BASIN OR (CUSTOM PROCEDURE TRAY) ×3 IMPLANT
KIT INFUSE X SMALL 1.4CC (Orthopedic Implant) ×2 IMPLANT
KIT ROOM TURNOVER OR (KITS) ×3 IMPLANT
MILL MEDIUM DISP (BLADE) ×2 IMPLANT
NDL BLUNT 18X1 FOR OR ONLY (NEEDLE) IMPLANT
NEEDLE BLUNT 18X1 FOR OR ONLY (NEEDLE) ×3 IMPLANT
NEEDLE HYPO 22GX1.5 SAFETY (NEEDLE) ×3 IMPLANT
NS IRRIG 1000ML POUR BTL (IV SOLUTION) ×3 IMPLANT
OIL CARTRIDGE MAESTRO DRILL (MISCELLANEOUS) ×3
PACK LAMINECTOMY NEURO (CUSTOM PROCEDURE TRAY) ×3 IMPLANT
PAD ARMBOARD 7.5X6 YLW CONV (MISCELLANEOUS) ×9 IMPLANT
PATTIES SURGICAL .5 X.5 (GAUZE/BANDAGES/DRESSINGS) ×2 IMPLANT
PATTIES SURGICAL .5 X1 (DISPOSABLE) ×5 IMPLANT
PATTIES SURGICAL 1X1 (DISPOSABLE) ×4 IMPLANT
PLASMABLADE 3.0S (MISCELLANEOUS) ×3
ROD RELINE-O LORD 5.5X35MM (Rod) ×4 IMPLANT
SCREW LOCK RELINE 5.5 TULIP (Screw) ×8 IMPLANT
SCREW RELINE-O POLY 6.5X40 (Screw) ×8 IMPLANT
SEALANT ADHERUS EXTEND TIP (MISCELLANEOUS) ×6 IMPLANT
SPONGE LAP 4X18 X RAY DECT (DISPOSABLE) IMPLANT
SPONGE SURGIFOAM ABS GEL 100 (HEMOSTASIS) ×3 IMPLANT
SUT PROLENE 6 0 BV (SUTURE) ×2 IMPLANT
SUT VIC AB 1 CT1 18XBRD ANBCTR (SUTURE) ×1 IMPLANT
SUT VIC AB 1 CT1 8-18 (SUTURE) ×3
SUT VIC AB 2-0 CP2 18 (SUTURE) ×5 IMPLANT
SUT VIC AB 3-0 SH 8-18 (SUTURE) ×5 IMPLANT
SYR 3ML LL SCALE MARK (SYRINGE) ×12 IMPLANT
TOWEL GREEN STERILE (TOWEL DISPOSABLE) ×3 IMPLANT
TOWEL GREEN STERILE FF (TOWEL DISPOSABLE) ×3 IMPLANT
TRAY FOLEY W/METER SILVER 16FR (SET/KITS/TRAYS/PACK) ×3 IMPLANT
WATER STERILE IRR 1000ML POUR (IV SOLUTION) ×3 IMPLANT

## 2017-02-01 NOTE — Progress Notes (Signed)
Patient ID: Melissa Maldonado, female   DOB: 02-Oct-1940, 76 y.o.   MRN: [id number] Vital signs are stable Motor function is intact Patient is feeling well Dressing is clean and dry Mobilizing slowly.

## 2017-02-01 NOTE — Anesthesia Postprocedure Evaluation (Signed)
Anesthesia Post Note  Patient: Melissa Maldonado  Procedure(s) Performed: Procedure(s) (LRB): Lumbar one-two  Posterior lumbar  fusion (N/A)     Patient location during evaluation: PACU Anesthesia Type: General Level of consciousness: awake and alert, patient cooperative and oriented Pain control: pain improving. Vital Signs Assessment: post-procedure vital signs reviewed and stable Respiratory status: spontaneous breathing, nonlabored ventilation, respiratory function stable and patient connected to nasal cannula oxygen Cardiovascular status: blood pressure returned to baseline and stable Postop Assessment: no signs of nausea or vomiting Anesthetic complications: no    Last Vitals:  Vitals:   02/01/17 1408 02/01/17 1419  BP:  (!) 108/41  Pulse:  (!) 57  Resp:  18  Temp: (!) 36.4 C 36.6 C    Last Pain:  Vitals:   02/01/17 1720  TempSrc:   PainSc: 9                  Reta Norgren,E. Andrewjames Weirauch

## 2017-02-01 NOTE — Transfer of Care (Signed)
Immediate Anesthesia Transfer of Care Note  Patient: Melissa Maldonado  Procedure(s) Performed: Procedure(s): Lumbar one-two  Posterior lumbar  fusion (N/A)  Patient Location: PACU  Anesthesia Type:General  Level of Consciousness: awake and patient cooperative  Airway & Oxygen Therapy: Patient Spontanous Breathing  Post-op Assessment: Report given to RN and Post -op Vital signs reviewed and stable  Post vital signs: Reviewed and stable  Last Vitals:  Vitals:   02/01/17 0616 02/01/17 1246  BP: (!) 128/55   Pulse:    Resp:    Temp:  (P) 36.7 C    Last Pain:  Vitals:   02/01/17 0629  TempSrc:   PainSc: 9       Patients Stated Pain Goal: 3 (31/54/00 8676)  Complications: No apparent anesthesia complications

## 2017-02-01 NOTE — H&P (Addendum)
Melissa Maldonado is an 76 y.o. female.   Chief Complaint: Back bilateral leg pain, stenosis L1-L2 HPI: Patient is a 76 year old individual who has had previous spondylitic problems in the lumbar spine having had a decompression and fusion L2-3 and L4-5. She has developed significant increase in pain with centralized back pain and bilateral lower extremity pain and was found to have adjacent level stenosis at L1-L2. She's been advised regarding the need for surgical decompression and stabilization.  Past Medical History:  Diagnosis Date  . Arthritis   . Chronic back pain    lumbar stenosis  . Early cataracts, bilateral   . GERD (gastroesophageal reflux disease)    occasionally but no meds required  . Headache(784.0)    occasionally  . History of blood transfusion   . History of colon polyps   . Hyperlipidemia    takes Lipitor every other day  . Hypertension    takes losartan daily  . Osteoarthritis of left knee 04/27/2014  . Shortness of breath    with exertion  . SOB (shortness of breath) 2013   CPET  . Tear of medial meniscus of left knee 09/08/2013    Past Surgical History:  Procedure Laterality Date  . APPENDECTOMY    . BACK SURGERY  2010   lumb fusion  . COLONOSCOPY    . EYE SURGERY     both cataracts  . KNEE ARTHROSCOPY WITH MEDIAL MENISECTOMY Left 09/08/2013   Procedure: LEFT KNEE ARTHROSCOPY WITH PARTIAL MEDIAL MENISCECTOMY;  Surgeon: [name], MD;  Location: [place] [facility];  Service: Orthopedics;  Laterality: Left;  . LUMBAR LAMINECTOMY/DECOMPRESSION MICRODISCECTOMY  05/13/2012   Procedure: LUMBAR LAMINECTOMY/DECOMPRESSION MICRODISCECTOMY 1 LEVEL;  Surgeon: [name], MD;  Location: [place] [facility];  Service: Neurosurgery;  Laterality: Bilateral;  Bilateral Lumbar one -two Decompressive Laminectomy  . PARTIAL KNEE ARTHROPLASTY Left 04/27/2014   Procedure: LEFT UNICOMPARTMENTAL KNEE;  Surgeon: [name], MD;  Location: [facility];   Service: Orthopedics;  Laterality: Left;  . UPPER GI ENDOSCOPY      No family history on file. Social History:  reports that she has never smoked. She does not have any smokeless tobacco history on file. She reports that she drinks alcohol. She reports that she does not use drugs.  Allergies:  Allergies  Allergen Reactions  . Aspirin Other (See Comments)    "knocked out"  . Codeine Nausea Only    Medications Prior to Admission  Medication Sig Dispense Refill  . Multiple Vitamins-Minerals (MULTIVITAMIN PO) Take 2 tablets by mouth daily.    . naproxen sodium (ANAPROX) 220 MG tablet Take 440 mg by mouth daily.    Marland Kitchen rOPINIRole (REQUIP) 0.5 MG tablet Take 0.5 mg by mouth at bedtime.    . valsartan-hydrochlorothiazide (DIOVAN-HCT) 320-25 MG tablet Take 1 tablet by mouth daily.  12  . vitamin B-12 (CYANOCOBALAMIN) 1000 MCG tablet Take 1,000 mcg by mouth daily.      No results found for this or any previous visit (from the past 48 hour(s)). No results found.  Review of Systems  HENT: Negative.   Eyes: Negative.   Respiratory: Negative.   Cardiovascular: Negative.   Gastrointestinal: Negative.   Genitourinary: Negative.   Musculoskeletal: Positive for back pain.  Skin: Negative.   Neurological: Positive for tingling, sensory change and weakness.  Endo/Heme/Allergies: Negative.   Psychiatric/Behavioral: Negative.     Blood pressure (!) 128/55, pulse (!) 46, temperature 98.1 F (36.7 C), temperature source Oral, resp.  rate 18, weight 97.5 kg (215 lb), SpO2 97 %. Physical Exam  Constitutional: She is oriented to person, place, and time. She appears well-developed and well-nourished.  HENT:  Head: Normocephalic and atraumatic.  Eyes: Pupils are equal, round, and reactive to light. Conjunctivae and EOM are normal.  Neck: Neck supple.  GI: Soft. Bowel sounds are normal.  Musculoskeletal:  Positive straight leg raising bilaterally at 45.  Neurological: She is alert and oriented  to person, place, and time. She has normal reflexes.  Skin: Skin is warm and dry.  Psychiatric: She has a normal mood and affect. Her behavior is normal. Judgment and thought content normal.     Assessment/Plan Stenosis L1-L2. Lumbar radiculopathy.  Plan: Decompression L1-L2 with posterior stabilization.  Earleen Newport, MD 02/01/2017, 7:34 AM

## 2017-02-01 NOTE — Op Note (Signed)
Date of surgery: 02/01/2017 Preoperative diagnosis: Spondylosis and stenosis L1-L2 with lumbar radiculopathy, neurogenic claudication. Postoperative diagnosis: Same Procedure: Bilateral laminotomies L1-L2 decompression of the common dural tube and the L1 and L2 nerve roots. Removal of hardware from L2-L3 pedicle screw fixation L1-L2 with posterior lateral arthrodesis using local autograft and infuse L1-L2. Duraplasty Surgeon: [name] M.D. First assistant: Newman Pies M.D. Anesthesia: Gen. endotracheal Indications: Cyst [place] 76 year old individual is had a previous decompression fusion at L2-3 and L4-5 she has had ongoing problems with back pain and has developed severe spondylitic stenosis at the L1-L2 level. She is advised regarding the need for surgical decompression and stabilization at that level.  Procedure: The patient was brought to the operating room supine on a stretcher. After the smooth induction of general endotracheal anesthesia, she was turned prone. The bony prominences were appropriately padded and protected. A midline incision was created and carried down to the lumbar dorsal fascia. Fascia was opened on the side of midline to expose the spinous processes of L1 and L2 and the previously placed hardware at the L2-L3 level. This was noted to be Biomet type hardware with a pentagonal screw. We were able to obtain some hardware removal instrumentation after a short period of time but no meantime the L1-L2 interlaminar space was dissected. The space was then decompressed with wide laminotomies being placed at L1-L2 out to the lateral recesses. Common dural tube was identified on the left side in this area was decompressed the path of the L1 nerve root superiorly was decompressed. L2 inferiorly was decompressed. The disc space itself was noted be very narrowed and tight. Attention was turned to the right side where a laminotomy was created in a similar fashion here however is noted  that part of the yellow ligament appeared calcified and stuck to the dura such that on doing the decompression a large dural opening had occurred within the laminotomy site. The dural edges were hard to dissect from the surrounding bone. The arachnoid appeared fairly contained over most of this and in this dural defect I placed a piece of DuraGen which was gradually placed under the bony edges presumably under the dural edges also. With careful dissection.a decompression was completed so as to decompress the L1 nerve root superiorly and the L2 nerve root and the common dural tube inferiorly. Once the decompression was completed in the DuraGen was placed in this region is held in place by plate tacking the suture of 6-0 Prolene from the remnant of the dura medially to the bony edge laterally. This was then coated with a biologic adhesive. The disc space was not able we palpated on the left side given the tightness of the disc space on the opposite side and decided not to perform a posterior interbody arthrodesis. With the decompression being assured pedicle entry sites were chosen at L1 and 6.5 x 40 mm screws were placed in L1. 6.5 x 40 mm screws were placed in L2. Short 74mm precontoured rods were placed between the screw heads. Prior to this the intertransverse spaces between L1 and L2 were decorticated. 6 mL of bone graft along with 2 strips of infuse were placed into the lateral gutters. Care was taken to make sure that the common dural tube and the nerve roots were well decompressed in the in no spinal fluid leakage was noted. With the decompression being verified then the lumbar dorsal fascia was reapproximated tightly with #1 Vicryl interrupted fashion 2-0 Vicryl was used in the subcutaneous tissues  and 3-0 Vicryl was used to close subcutaneous take her skin. Dermabond was placed on the skin. Blood loss is estimated at 450 mL, and 110 mL of Cell Saver blood was returned to the patient.

## 2017-02-01 NOTE — Anesthesia Procedure Notes (Signed)
Procedure Name: Intubation Date/Time: 02/01/2017 7:50 AM Performed by: Lance Coon Pre-anesthesia Checklist: Patient identified, Emergency Drugs available, Suction available, Patient being monitored and Timeout performed Patient Re-evaluated:Patient Re-evaluated prior to induction Oxygen Delivery Method: Circle system utilized Preoxygenation: Pre-oxygenation with 100% oxygen Induction Type: IV induction Ventilation: Mask ventilation without difficulty and Oral airway inserted - appropriate to patient size Laryngoscope Size: Mac and 3 Grade View: Grade I Tube type: Oral Tube size: 7.0 mm Number of attempts: 1 Airway Equipment and Method: Stylet Placement Confirmation: ETT inserted through vocal cords under direct vision,  positive ETCO2 and breath sounds checked- equal and bilateral Secured at: 22 cm Tube secured with: Tape Dental Injury: Teeth and Oropharynx as per pre-operative assessment  Comments: Airway per L. Rocky Link

## 2017-02-01 NOTE — Brief Op Note (Signed)
Foley catheter red seal had to be broken because patient required a smaller size 12 catheter versus standard 16 that is attached to urometer.

## 2017-02-01 NOTE — Care Management Note (Signed)
Case Management Note  Patient Details  Name: Melissa Maldonado MRN: [id number] Date of Birth: 10/23/1940  Subjective/Objective:               CM following for progression and d/c planning.     Action/Plan: 02/01/2017 Noted CM consult for DME and HH , await PT recommendations, orders and face to face.   Expected Discharge Date:                  Expected Discharge Plan:     In-House Referral:     Discharge planning Services     Post Acute Care Choice:    Choice offered to:     DME Arranged:    DME Agency:     HH Arranged:    HH Agency:     Status of Service:  In process, will continue to follow  If discussed at Long Length of Stay Meetings, dates discussed:    Additional Comments:  Adron Bene, RN 02/01/2017, 2:35 PM

## 2017-02-02 ENCOUNTER — Encounter (HOSPITAL_COMMUNITY): Payer: Self-pay

## 2017-02-02 LAB — BASIC METABOLIC PANEL
Anion gap: 7 (ref 5–15)
BUN: 21 mg/dL — ABNORMAL HIGH (ref 6–20)
CALCIUM: 8.1 mg/dL — AB (ref 8.9–10.3)
CO2: 25 mmol/L (ref 22–32)
CREATININE: 1.42 mg/dL — AB (ref 0.44–1.00)
Chloride: 103 mmol/L (ref 101–111)
GFR, EST AFRICAN AMERICAN: 41 mL/min — AB (ref >60)
GFR, EST NON AFRICAN AMERICAN: 36 mL/min — AB (ref >60)
Glucose, Bld: 113 mg/dL — ABNORMAL HIGH (ref 65–99)
Potassium: 4.2 mmol/L (ref 3.5–5.1)
SODIUM: 135 mmol/L (ref 135–145)

## 2017-02-02 LAB — CBC
HCT: 29.1 % — ABNORMAL LOW (ref 36.0–46.0)
HEMOGLOBIN: 9.3 g/dL — AB (ref 12.0–15.0)
MCH: 28.7 pg (ref 26.0–34.0)
MCHC: 32 g/dL (ref 30.0–36.0)
MCV: 89.8 fL (ref 78.0–100.0)
PLATELETS: 209 10*3/uL (ref 150–400)
RBC: 3.24 MIL/uL — ABNORMAL LOW (ref 3.87–5.11)
RDW: 13.4 % (ref 11.5–15.5)
WBC: 12.6 10*3/uL — ABNORMAL HIGH (ref 4.0–10.5)

## 2017-02-02 MED ORDER — METHOCARBAMOL 500 MG PO TABS: 500.0000 mg | ORAL_TABLET | Freq: Four times a day (QID) | ORAL | 3 refills | Status: DC | PRN

## 2017-02-02 MED ORDER — HYDROCODONE-ACETAMINOPHEN 5-325 MG PO TABS: 1.0000 | ORAL_TABLET | ORAL | Status: DC | PRN

## 2017-02-02 MED ORDER — FERROUS SULFATE 325 (65 FE) MG PO TABS
325.0000 mg | ORAL_TABLET | Freq: Every day | ORAL | Status: DC
  Administered 2017-02-02 – 2017-02-03 (×2): 325 mg via ORAL
  Filled 2017-02-02 (×2): qty 1

## 2017-02-02 MED ORDER — HYDROCODONE-ACETAMINOPHEN 5-325 MG PO TABS
1.0000 | ORAL_TABLET | ORAL | 0 refills | Status: DC | PRN
Start: 2017-02-02 — End: 2020-01-12

## 2017-02-02 MED FILL — Thrombin For Soln 5000 Unit: CUTANEOUS | Qty: 5000 | Status: AC

## 2017-02-02 MED FILL — Gelatin Absorbable MT Powder: OROMUCOSAL | Qty: 1 | Status: AC

## 2017-02-02 NOTE — Evaluation (Signed)
Physical Therapy Evaluation Patient Details Name: Melissa Maldonado MRN: [id number] DOB: [birth year] Today's Date: 02/02/2017   History of Present Illness  patient is a 76 yo female s/p L2-l3 lumbar fusion  Clinical Impression  Patient seen for mobility assessment s/p spinal surgery. Mobilizing well. Educated patient on precautions, mobility expectations, safety and car transfers. No further acute PT needs. Will sign off.     Follow Up Recommendations No PT follow up    Equipment Recommendations  None recommended by PT    Recommendations for Other Services       Precautions / Restrictions Precautions Precautions: Back Precaution Booklet Issued: Yes (comment) Precaution Comments: verablly reviewed with patient Required Braces or Orthoses: Spinal Brace Spinal Brace: Lumbar corset Restrictions Weight Bearing Restrictions: No      Mobility  Bed Mobility Overal bed mobility: Modified Independent             General bed mobility comments: increased time to perform, good technique  Transfers Overall transfer level: Independent Equipment used: None             General transfer comment: no difficulties  Ambulation/Gait Ambulation/Gait assistance: Independent Ambulation Distance (Feet): 360 Feet Assistive device: None Gait Pattern/deviations: WFL(Within Functional Limits)     General Gait Details: steady with ambulation, good speed  Stairs Stairs: Yes Stairs assistance: Modified independent (Device/Increase time) Stair Management: One rail Right Number of Stairs: 8 General stair comments: no difficulty with stair negotiation  Wheelchair Mobility    Modified Rankin (Stroke Patients Only)       Balance Overall balance assessment: Independent                                           Pertinent Vitals/Pain Pain Assessment: Faces Faces Pain Scale: Hurts a little bit Pain Location: back and right hip Pain Descriptors / Indicators:  Sore Pain Intervention(s): Monitored during session    Home Living Family/patient expects to be discharged to:: Private residence Living Arrangements: Spouse/significant other Available Help at Discharge: Available 24 hours/day Type of Home: House Home Access: Stairs to enter Entrance Stairs-Rails: Psychiatric nurse of Steps: 6 Home Layout: Two level Home Equipment: Environmental consultant - 2 wheels;Bedside commode;Shower seat;Cane - single point      Prior Function Level of Independence: Independent               Hand Dominance   Dominant Hand: Right    Extremity/Trunk Assessment   Upper Extremity Assessment Upper Extremity Assessment: Overall WFL for tasks assessed    Lower Extremity Assessment Lower Extremity Assessment: Overall WFL for tasks assessed    Cervical / Trunk Assessment Cervical / Trunk Assessment:  (s/p spinal surgery)  Communication   Communication: No difficulties  Cognition Arousal/Alertness: Awake/alert Behavior During Therapy: WFL for tasks assessed/performed Overall Cognitive Status: Within Functional Limits for tasks assessed                                        General Comments General comments (skin integrity, edema, etc.): educated on mobility expectations, car transfers, pain management and precautions    Exercises     Assessment/Plan    PT Assessment Patent does not need any further PT services  PT Problem List         PT Treatment Interventions  PT Goals (Current goals can be found in the Care Plan section)  Acute Rehab PT Goals PT Goal Formulation: All assessment and education complete, DC therapy    Frequency     Barriers to discharge        Co-evaluation               AM-PAC PT "6 Clicks" Daily Activity  Outcome Measure Difficulty turning over in bed (including adjusting bedclothes, sheets and blankets)?: None Difficulty moving from lying on back to sitting on the side of the bed?  : None Difficulty sitting down on and standing up from a chair with arms (e.g., wheelchair, bedside commode, etc,.)?: None Help needed moving to and from a bed to chair (including a wheelchair)?: None Help needed walking in hospital room?: None Help needed climbing 3-5 steps with a railing? : None 6 Click Score: 24    End of Session Equipment Utilized During Treatment: Back brace Activity Tolerance: Patient tolerated treatment well Patient left: in bed;with nursing/sitter in room;with call bell/phone within reach (at EOB) Nurse Communication: Mobility status PT Visit Diagnosis: Pain Pain - Right/Left:  (back)    Time: 0812-0830 PT Time Calculation (min) (ACUTE ONLY): 18 min   Charges:   PT Evaluation $PT Eval Low Complexity: 1 Procedure     PT G Codes:   PT G-Codes **NOT FOR INPATIENT CLASS** Functional Assessment Tool Used: Clinical judgement Functional Limitation: Mobility: Walking and moving around Mobility: Walking and Moving Around Current Status (L8937): At least 1 percent but less than 20 percent impaired, limited or restricted Mobility: Walking and Moving Around Goal Status [phone]): At least 1 percent but less than 20 percent impaired, limited or restricted Mobility: Walking and Moving Around Discharge Status [phone]): At least 1 percent but less than 20 percent impaired, limited or restricted    Alben Deeds, PT DPT [facility] 02/02/2017, 9:05 AM

## 2017-02-02 NOTE — Discharge Summary (Signed)
Physician Discharge Summary  Patient ID: Melissa Maldonado MRN: [id number] DOB/AGE: 1940/08/13 76 y.o.  Admit date: 02/01/2017 Discharge date: 02/02/2017  Admission Diagnoses:Lumbar stenosis L1-L2, status post decompression and fusion L2-3 and L4-5. Lumbar radiculopathy  Discharge Diagnoses: Lumbar stenosis L1 to, status post decompression and fusion L2-3 and L4-5, lumbar radiculopathy acute blood loss anemia Active Problems:   Lumbar stenosis with neurogenic claudication   Discharged Condition: good  Hospital Course: Patient was admitted to undergo surgical decompression which she tolerated well. She did have a cervical spinal fluid leak at time of surgery. This is not cause any postoperative problems at this point. Incision is clean and dry.  Consults: None  Significant Diagnostic Studies: Preoperative  hematocrit 38.2, postoperative hematocrit 29.  Treatments: surgery: Surgery, decompression L12 with posterior fixation and posterior lateral arthrodesis  Discharge Exam: Blood pressure (!) 143/53, pulse 72, temperature 98.9 F (37.2 C), temperature source Oral, resp. rate 18, height 5\' 5"  (1.651 m), weight 99 kg (218 lb 4.8 oz), SpO2 99 %. Incision/Wound: Incision is clean and dry. Motor function is intact. Station and gait are intact.  Disposition: 01-Home or Self Care  Discharge Instructions    Call MD for:  redness, tenderness, or signs of infection (pain, swelling, redness, odor or green/yellow discharge around incision site)    Complete by:  As directed    Call MD for:  severe uncontrolled pain    Complete by:  As directed    Call MD for:  temperature >100.4    Complete by:  As directed    Diet - low sodium heart healthy    Complete by:  As directed    Discharge instructions    Complete by:  As directed    Okay to shower. Do not apply salves or appointments to incision. No heavy lifting with the upper extremities greater than 15 pounds. May resume driving when not requiring  pain medication and patient feels comfortable with doing so.   Incentive spirometry RT    Complete by:  As directed    Increase activity slowly    Complete by:  As directed      Allergies as of 02/02/2017      Reactions   Aspirin Other (See Comments)   "knocked out"   Codeine Nausea Only      Medication List    TAKE these medications   HYDROcodone-acetaminophen 5-325 MG tablet Commonly known as:  NORCO/VICODIN Take 1-2 tablets by mouth every 4 (four) hours as needed for severe pain.   methocarbamol 500 MG tablet Commonly known as:  ROBAXIN Take 1 tablet (500 mg total) by mouth every 6 (six) hours as needed for muscle spasms.   MULTIVITAMIN PO Take 2 tablets by mouth daily.   naproxen sodium 220 MG tablet Commonly known as:  ANAPROX Take 440 mg by mouth daily.   rOPINIRole 0.5 MG tablet Commonly known as:  REQUIP Take 0.5 mg by mouth at bedtime.   valsartan-hydrochlorothiazide 320-25 MG tablet Commonly known as:  DIOVAN-HCT Take 1 tablet by mouth daily.   vitamin B-12 1000 MCG tablet Commonly known as:  CYANOCOBALAMIN Take 1,000 mcg by mouth daily.        SignedEarleen Newport 02/02/2017, 6:46 PM

## 2017-02-02 NOTE — Progress Notes (Signed)
OT Cancellation Note  Patient Details Name: Melissa Maldonado MRN: [id number] DOB: 12/23/1940   Cancelled Treatment:    Reason Eval/Treat Not Completed: Other (comment): OT attempted evaluation x2 with pt initially eating and now sleeping soundly with family requesting OT return at a later time. OT will check back as able for evaluation.   Norman Herrlich, MS OTR/L  Pager: [phone]   Norman Herrlich 02/02/2017, 12:21 PM

## 2017-02-02 NOTE — Evaluation (Signed)
Occupational Therapy Evaluation Patient Details Name: Melissa Maldonado MRN: [id number] DOB: [birth year] Today's Date: 02/02/2017    History of Present Illness Patient is a 76 yo female s/p L2-l3 lumbar fusion. PMH significant for arthritis, chronic back pain, bilateral cataracts, GERD, headache, hyperlipidemia, hypertension, osteoarthritis of L knee, shortness of breath. Past surgical history includes lumbar fusion in 2010.   Clinical Impression   PTA, pt was independent with ADL and functional mobility. She currently requires min assist for LB ADL and overall supervision for toilet and shower transfers. Pt presents with post-operative pain limiting her ability to participate in ADL at [facility]. Educated pt concerning compensatory strategies to adhere to back precautions during dressing, bathing, toileting, and grooming tasks and reviewed handout in detail. Pt demonstrates good understanding of all topics and will have 24 hour assistance available from her husband who is able to provide the necessary assistance. Additionally educated pt concerning brace wear schedule. All education complete and pt reports no further questions or concerns. No further acute OT needs identified and OT will sign off.      Follow Up Recommendations  No OT follow up;Supervision/Assistance - 24 hour    Equipment Recommendations  None recommended by OT    Recommendations for Other Services       Precautions / Restrictions Precautions Precautions: Back Precaution Booklet Issued: Yes (comment) Precaution Comments: verablly reviewed with patient Required Braces or Orthoses: Spinal Brace Spinal Brace: Lumbar corset Restrictions Weight Bearing Restrictions: No      Mobility Bed Mobility Overal bed mobility: Modified Independent             General bed mobility comments: increased time to perform, good technique  Transfers Overall transfer level: Modified independent Equipment used: None              General transfer comment: Increased time    Balance Overall balance assessment: No apparent balance deficits (not formally assessed)                                         ADL either performed or assessed with clinical judgement   ADL Overall ADL's : Needs assistance/impaired Eating/Feeding: Set up;Sitting   Grooming: Supervision/safety;Standing   Upper Body Bathing: Set up;Sitting   Lower Body Bathing: Sit to/from stand;Minimal assistance   Upper Body Dressing : Set up;Sitting   Lower Body Dressing: Sit to/from stand;Minimal assistance   Toilet Transfer: Supervision/safety;Ambulation   Toileting- Clothing Manipulation and Hygiene: Supervision/safety;Sit to/from stand   Tub/ Shower Transfer: Supervision/safety;Adhering to back precautions;Cueing for safety;Ambulation;Shower seat   Functional mobility during ADLs: Supervision/safety General ADL Comments: Educated pt on compensatory strategies for dressing, bathing, grooming, and toileting tasks to maximize safety and adherence to back precautions.      Vision Patient Visual Report: No change from baseline Vision Assessment?: No apparent visual deficits     Perception     Praxis      Pertinent Vitals/Pain Pain Assessment: Faces Faces Pain Scale: Hurts even more Pain Location: back during bed mobility Pain Descriptors / Indicators: Sore Pain Intervention(s): Monitored during session;Limited activity within patient's tolerance;Repositioned     Hand Dominance Right   Extremity/Trunk Assessment Upper Extremity Assessment Upper Extremity Assessment: Overall WFL for tasks assessed   Lower Extremity Assessment Lower Extremity Assessment: Overall WFL for tasks assessed   Cervical / Trunk Assessment Cervical / Trunk Assessment: Other exceptions Cervical / Trunk Exceptions: s/p  spinal surgery   Communication Communication Communication: No difficulties   Cognition Arousal/Alertness:  Awake/alert Behavior During Therapy: WFL for tasks assessed/performed Overall Cognitive Status: Within Functional Limits for tasks assessed                                     General Comments       Exercises     Shoulder Instructions      Home Living Family/patient expects to be discharged to:: Private residence Living Arrangements: Spouse/significant other Available Help at Discharge: Available 24 hours/day Type of Home: House Home Access: Stairs to enter [employer] of Steps: 6 Entrance Stairs-Rails: Right;Left Home Layout: Two level   Alternate Level Stairs-Rails: Left Bathroom Shower/Tub: Walk-in shower;Tub/shower unit   Bathroom Toilet: Handicapped height     Home Equipment: Environmental consultant - 2 wheels;Bedside commode;Shower seat;Cane - single point;Adaptive equipment Adaptive Equipment: Reacher;Sock aid        Prior Functioning/Environment Level of Independence: Independent                 OT Problem List: Impaired balance (sitting and/or standing);Pain      OT Treatment/Interventions:      OT Goals(Current goals can be found in the care plan section) Acute Rehab OT Goals Patient Stated Goal: to go home OT Goal Formulation: With patient Time For Goal Achievement: 02/16/17 Potential to Achieve Goals: Good  OT Frequency:     Barriers to D/C:            Co-evaluation              AM-PAC PT "6 Clicks" Daily Activity     Outcome Measure Help from another person eating meals?: None Help from another person taking care of personal grooming?: A Little Help from another person toileting, which includes using toliet, bedpan, or urinal?: A Little Help from another person bathing (including washing, rinsing, drying)?: A Little Help from another person to put on and taking off regular upper body clothing?: None Help from another person to put on and taking off regular lower body clothing?: A Little 6 Click Score: 20   End of  Session Equipment Utilized During Treatment: Back brace Nurse Communication: Mobility status  Activity Tolerance: Patient tolerated treatment well Patient left: in bed;with call bell/phone within reach  OT Visit Diagnosis: Other abnormalities of gait and mobility (R26.89);Pain Pain - Right/Left:  (back) Pain - part of body:  (back)                Time: 7628-3151 OT Time Calculation (min): 21 min Charges:  OT General Charges $OT Visit: 1 Procedure OT Evaluation $OT Eval Moderate Complexity: 1 Procedure G-Codes:     Norman Herrlich, MS OTR/L  Pager: [place] A Jourdon Zimmerle 02/02/2017, 5:27 PM

## 2017-02-02 NOTE — Progress Notes (Signed)
Pt's foley d/c'd at 0500, then urinated X 1. Ambulated using front wheel rolling walker to bathroom X 1 and also down hall 500 ft without difficulty. Pain well controlled overnight with Tylenol and Robaxin. Incentive spirometer used X 4 with nurse in room. Surgical dressing clean, dry, intact. No swelling, redness, warmth, or drainage. Pt tolerated broth in evening, coffee in the am but has not attempted solid food yet. IV LR @ 125 mL/ hr continues. Pt indicated that she would like to go home this afternoon.

## 2017-02-02 NOTE — Progress Notes (Signed)
Patient ID: Melissa Maldonado, female   DOB: 1941-05-25, 76 y.o.   MRN: [id number] Subjective:  The patient is alert and pleasant. She looks well. She has mobilized. She denies headaches. She wants to go home tomorrow.  Objective: Vital signs in last 24 hours: Temp:  [97.5 F (36.4 C)-98.4 F (36.9 C)] 98.1 F (36.7 C) (07/17 0504) Pulse Rate:  [54-70] 58 (07/17 0504) Resp:  [13-25] 18 (07/17 0504) BP: (88-123)/(41-60) 108/60 (07/17 0530) SpO2:  [92 %-98 %] 96 % (07/17 0504) Weight:  [99 kg (218 lb 4.8 oz)] 99 kg (218 lb 4.8 oz) (07/16 1419)  Intake/Output from previous day: 07/16 0701 - 07/17 0700 In: 4535.4 [P.O.:250; I.V.:4135.4; Blood:150] Out: 2525 [Urine:2125; Blood:400] Intake/Output this shift: No intake/output data recorded.  Physical exam the patient is alert and oriented. Her strength is grossly normal and a bilateral gastrocnemius and dorsiflexors.  Lab Results:  Recent Labs  02/02/17 0559  WBC 12.6*  HGB 9.3*  HCT 29.1*  PLT 209   BMET  Recent Labs  02/02/17 0559  NA 135  K 4.2  CL 103  CO2 25  GLUCOSE 113*  BUN 21*  CREATININE 1.42*  CALCIUM 8.1*    Studies/Results: Dg Lumbar Spine 2-3 Views  Result Date: 02/01/2017 CLINICAL DATA:  76 year old female for L2-3 fusion. Subsequent encounter. EXAM: LUMBAR SPINE - 2-3 VIEW; DG C-ARM 61-120 MIN COMPARISON:  Postmyelogram CT 11/25/2016. FINDINGS: Fusion L1-2 where there is significant endplate sclerosis. Bilateral pedicle screws and posterior connecting bar utilized without obvious complication. Remote fusion L2-3 with interbody spacer. Pedicle screws and posterior connecting bar L2-3 level removed. Pedicle screws and posterior connecting bar L4-5 level. IMPRESSION: Interval fusion L1-2. These results will be called to the ordering clinician or representative by the Radiologist Assistant, and communication documented in the PACS or zVision Dashboard. Electronically Signed   By: Genia Del M.D.   On:  02/01/2017 12:12   Dg C-arm 1-60 Min  Result Date: 02/01/2017 CLINICAL DATA:  76 year old female for L2-3 fusion. Subsequent encounter. EXAM: LUMBAR SPINE - 2-3 VIEW; DG C-ARM 61-120 MIN COMPARISON:  Postmyelogram CT 11/25/2016. FINDINGS: Fusion L1-2 where there is significant endplate sclerosis. Bilateral pedicle screws and posterior connecting bar utilized without obvious complication. Remote fusion L2-3 with interbody spacer. Pedicle screws and posterior connecting bar L2-3 level removed. Pedicle screws and posterior connecting bar L4-5 level. IMPRESSION: Interval fusion L1-2. These results will be called to the ordering clinician or representative by the Radiologist Assistant, and communication documented in the PACS or zVision Dashboard. Electronically Signed   By: Genia Del M.D.   On: 02/01/2017 12:12    Assessment/Plan: Postop day #1: The patient is doing well. She will likely go home tomorrow.  Acute blood loss anemia: She appears asymptomatic. I think her mild hypotension is likely secondary to decreased activity and recent surgery. I'll add iron.  Renal insufficiency: Stable  LOS: 1 day     Amon Costilla D 02/02/2017, 7:34 AM

## 2017-02-03 NOTE — Progress Notes (Signed)
Pt discharged at this time to home with spouse.  Verbalizes understanding of all discharge instructions, including to call for a follow-up appointment and prescriptions.  She denies any pain.  Incision looks well.

## 2017-02-03 NOTE — Care Management Note (Signed)
Case Management Note  Patient Details  Name: Melissa Maldonado MRN: [id number] Date of Birth: January 27, 1941  Subjective/Objective:  From home, s/ps/p L2-l3 lumbar fusion, for discharge today, per pt/ot eval, no hhpt/hhot needed.  No needs.                    Action/Plan:   Expected Discharge Date:  02/03/17               Expected Discharge Plan:  Home/Self Care  In-House Referral:     Discharge planning Services  CM Consult  Post Acute Care Choice:    Choice offered to:     DME Arranged:    DME Agency:     HH Arranged:    HH Agency:     Status of Service:  Completed, signed off  If discussed at [employer] of Stay Meetings, dates discussed:    Additional Comments:  Zenon Mayo, RN 02/03/2017, 10:45 AM

## 2017-02-24 DIAGNOSIS — M5416 Radiculopathy, lumbar region: Secondary | ICD-10-CM | POA: Diagnosis not present

## 2017-02-24 DIAGNOSIS — Z6836 Body mass index (BMI) 36.0-36.9, adult: Secondary | ICD-10-CM | POA: Diagnosis not present

## 2017-03-08 DIAGNOSIS — H401222 Low-tension glaucoma, left eye, moderate stage: Secondary | ICD-10-CM | POA: Diagnosis not present

## 2017-03-08 DIAGNOSIS — H40011 Open angle with borderline findings, low risk, right eye: Secondary | ICD-10-CM | POA: Diagnosis not present

## 2017-03-08 DIAGNOSIS — H40033 Anatomical narrow angle, bilateral: Secondary | ICD-10-CM | POA: Diagnosis not present

## 2017-03-25 DIAGNOSIS — M79643 Pain in unspecified hand: Secondary | ICD-10-CM | POA: Diagnosis not present

## 2017-03-25 DIAGNOSIS — M1990 Unspecified osteoarthritis, unspecified site: Secondary | ICD-10-CM | POA: Diagnosis not present

## 2017-03-25 DIAGNOSIS — M170 Bilateral primary osteoarthritis of knee: Secondary | ICD-10-CM | POA: Diagnosis not present

## 2017-03-25 DIAGNOSIS — M25561 Pain in right knee: Secondary | ICD-10-CM | POA: Diagnosis not present

## 2017-04-01 DIAGNOSIS — Z0000 Encounter for general adult medical examination without abnormal findings: Secondary | ICD-10-CM | POA: Diagnosis not present

## 2017-04-01 DIAGNOSIS — E7800 Pure hypercholesterolemia, unspecified: Secondary | ICD-10-CM | POA: Diagnosis not present

## 2017-04-01 DIAGNOSIS — I10 Essential (primary) hypertension: Secondary | ICD-10-CM | POA: Diagnosis not present

## 2017-04-01 DIAGNOSIS — E559 Vitamin D deficiency, unspecified: Secondary | ICD-10-CM | POA: Diagnosis not present

## 2017-04-07 DIAGNOSIS — Z1212 Encounter for screening for malignant neoplasm of rectum: Secondary | ICD-10-CM | POA: Diagnosis not present

## 2017-04-07 DIAGNOSIS — I10 Essential (primary) hypertension: Secondary | ICD-10-CM | POA: Diagnosis not present

## 2017-04-07 DIAGNOSIS — R232 Flushing: Secondary | ICD-10-CM | POA: Diagnosis not present

## 2017-04-07 DIAGNOSIS — E669 Obesity, unspecified: Secondary | ICD-10-CM | POA: Diagnosis not present

## 2017-04-07 DIAGNOSIS — Z01419 Encounter for gynecological examination (general) (routine) without abnormal findings: Secondary | ICD-10-CM | POA: Diagnosis not present

## 2017-04-22 DIAGNOSIS — M6530 Trigger finger, unspecified finger: Secondary | ICD-10-CM | POA: Diagnosis not present

## 2017-04-22 DIAGNOSIS — I10 Essential (primary) hypertension: Secondary | ICD-10-CM | POA: Diagnosis not present

## 2017-04-22 DIAGNOSIS — J309 Allergic rhinitis, unspecified: Secondary | ICD-10-CM | POA: Diagnosis not present

## 2017-04-22 DIAGNOSIS — H9203 Otalgia, bilateral: Secondary | ICD-10-CM | POA: Diagnosis not present

## 2017-04-22 DIAGNOSIS — K5730 Diverticulosis of large intestine without perforation or abscess without bleeding: Secondary | ICD-10-CM | POA: Diagnosis not present

## 2017-04-22 DIAGNOSIS — Z6835 Body mass index (BMI) 35.0-35.9, adult: Secondary | ICD-10-CM | POA: Diagnosis not present

## 2017-04-22 DIAGNOSIS — M25561 Pain in right knee: Secondary | ICD-10-CM | POA: Diagnosis not present

## 2017-04-22 DIAGNOSIS — K648 Other hemorrhoids: Secondary | ICD-10-CM | POA: Diagnosis not present

## 2017-04-22 DIAGNOSIS — R252 Cramp and spasm: Secondary | ICD-10-CM | POA: Diagnosis not present

## 2017-04-22 DIAGNOSIS — M25569 Pain in unspecified knee: Secondary | ICD-10-CM | POA: Diagnosis not present

## 2017-04-22 DIAGNOSIS — R748 Abnormal levels of other serum enzymes: Secondary | ICD-10-CM | POA: Diagnosis not present

## 2017-04-22 DIAGNOSIS — M159 Polyosteoarthritis, unspecified: Secondary | ICD-10-CM | POA: Diagnosis not present

## 2017-05-04 DIAGNOSIS — R42 Dizziness and giddiness: Secondary | ICD-10-CM | POA: Diagnosis not present

## 2017-05-04 DIAGNOSIS — H6122 Impacted cerumen, left ear: Secondary | ICD-10-CM | POA: Diagnosis not present

## 2017-05-04 DIAGNOSIS — H9193 Unspecified hearing loss, bilateral: Secondary | ICD-10-CM | POA: Diagnosis not present

## 2017-05-04 DIAGNOSIS — Z7289 Other problems related to lifestyle: Secondary | ICD-10-CM | POA: Diagnosis not present

## 2017-05-12 DIAGNOSIS — H3509 Other intraretinal microvascular abnormalities: Secondary | ICD-10-CM | POA: Diagnosis not present

## 2017-05-12 DIAGNOSIS — H401222 Low-tension glaucoma, left eye, moderate stage: Secondary | ICD-10-CM | POA: Diagnosis not present

## 2017-05-12 DIAGNOSIS — H35033 Hypertensive retinopathy, bilateral: Secondary | ICD-10-CM | POA: Diagnosis not present

## 2017-05-12 DIAGNOSIS — H40011 Open angle with borderline findings, low risk, right eye: Secondary | ICD-10-CM | POA: Diagnosis not present

## 2017-05-24 DIAGNOSIS — I10 Essential (primary) hypertension: Secondary | ICD-10-CM | POA: Diagnosis not present

## 2017-05-25 DIAGNOSIS — L299 Pruritus, unspecified: Secondary | ICD-10-CM | POA: Diagnosis not present

## 2017-05-25 DIAGNOSIS — I10 Essential (primary) hypertension: Secondary | ICD-10-CM | POA: Diagnosis not present

## 2017-07-28 ENCOUNTER — Other Ambulatory Visit: Payer: Self-pay | Admitting: Neurological Surgery

## 2017-07-28 DIAGNOSIS — M5416 Radiculopathy, lumbar region: Secondary | ICD-10-CM | POA: Diagnosis not present

## 2017-07-28 DIAGNOSIS — Z6836 Body mass index (BMI) 36.0-36.9, adult: Secondary | ICD-10-CM | POA: Diagnosis not present

## 2017-08-09 ENCOUNTER — Ambulatory Visit
Admission: RE | Admit: 2017-08-09 | Discharge: 2017-08-09 | Disposition: A | Discharge status: Home / Self Care | Payer: Medicare Other | Source: Ambulatory Visit | Attending: Neurological Surgery | Admitting: Neurological Surgery

## 2017-08-09 DIAGNOSIS — M5416 Radiculopathy, lumbar region: Secondary | ICD-10-CM

## 2017-08-09 DIAGNOSIS — M533 Sacrococcygeal disorders, not elsewhere classified: Secondary | ICD-10-CM | POA: Diagnosis not present

## 2017-08-17 DIAGNOSIS — G8929 Other chronic pain: Secondary | ICD-10-CM | POA: Diagnosis not present

## 2017-08-17 DIAGNOSIS — M170 Bilateral primary osteoarthritis of knee: Secondary | ICD-10-CM | POA: Diagnosis not present

## 2017-08-17 DIAGNOSIS — M1711 Unilateral primary osteoarthritis, right knee: Secondary | ICD-10-CM | POA: Diagnosis not present

## 2017-08-26 ENCOUNTER — Other Ambulatory Visit: Payer: Self-pay | Admitting: Internal Medicine

## 2017-08-26 DIAGNOSIS — M545 Low back pain: Secondary | ICD-10-CM | POA: Diagnosis not present

## 2017-08-26 DIAGNOSIS — M79604 Pain in right leg: Secondary | ICD-10-CM | POA: Diagnosis not present

## 2017-08-26 DIAGNOSIS — Z79899 Other long term (current) drug therapy: Secondary | ICD-10-CM | POA: Diagnosis not present

## 2017-08-26 DIAGNOSIS — R7989 Other specified abnormal findings of blood chemistry: Secondary | ICD-10-CM | POA: Diagnosis not present

## 2017-08-26 DIAGNOSIS — G2581 Restless legs syndrome: Secondary | ICD-10-CM | POA: Diagnosis not present

## 2017-08-26 DIAGNOSIS — R0989 Other specified symptoms and signs involving the circulatory and respiratory systems: Secondary | ICD-10-CM | POA: Diagnosis not present

## 2017-08-26 DIAGNOSIS — R252 Cramp and spasm: Secondary | ICD-10-CM | POA: Diagnosis not present

## 2017-08-26 DIAGNOSIS — E538 Deficiency of other specified B group vitamins: Secondary | ICD-10-CM | POA: Diagnosis not present

## 2017-08-26 DIAGNOSIS — M79605 Pain in left leg: Secondary | ICD-10-CM | POA: Diagnosis not present

## 2017-09-01 DIAGNOSIS — I10 Essential (primary) hypertension: Secondary | ICD-10-CM | POA: Diagnosis not present

## 2017-09-01 DIAGNOSIS — Z6836 Body mass index (BMI) 36.0-36.9, adult: Secondary | ICD-10-CM | POA: Diagnosis not present

## 2017-09-01 DIAGNOSIS — M5416 Radiculopathy, lumbar region: Secondary | ICD-10-CM | POA: Diagnosis not present

## 2017-09-02 DIAGNOSIS — R0989 Other specified symptoms and signs involving the circulatory and respiratory systems: Secondary | ICD-10-CM | POA: Diagnosis not present

## 2017-09-08 ENCOUNTER — Other Ambulatory Visit (HOSPITAL_COMMUNITY): Payer: Self-pay | Admitting: Neurological Surgery

## 2017-09-08 ENCOUNTER — Other Ambulatory Visit: Payer: Self-pay | Admitting: Neurological Surgery

## 2017-09-08 DIAGNOSIS — M5416 Radiculopathy, lumbar region: Secondary | ICD-10-CM

## 2017-09-14 DIAGNOSIS — M792 Neuralgia and neuritis, unspecified: Secondary | ICD-10-CM | POA: Diagnosis not present

## 2017-09-15 ENCOUNTER — Ambulatory Visit
Admission: RE | Admit: 2017-09-15 | Discharge: 2017-09-15 | Disposition: A | Discharge status: Home / Self Care | Payer: Medicare Other | Source: Ambulatory Visit | Attending: Neurological Surgery | Admitting: Neurological Surgery

## 2017-09-15 DIAGNOSIS — M48061 Spinal stenosis, lumbar region without neurogenic claudication: Secondary | ICD-10-CM | POA: Diagnosis not present

## 2017-09-15 DIAGNOSIS — M5416 Radiculopathy, lumbar region: Secondary | ICD-10-CM

## 2017-09-22 DIAGNOSIS — Z6836 Body mass index (BMI) 36.0-36.9, adult: Secondary | ICD-10-CM | POA: Diagnosis not present

## 2017-09-22 DIAGNOSIS — M5416 Radiculopathy, lumbar region: Secondary | ICD-10-CM | POA: Diagnosis not present

## 2017-09-22 DIAGNOSIS — I10 Essential (primary) hypertension: Secondary | ICD-10-CM | POA: Diagnosis not present

## 2017-10-05 DIAGNOSIS — M48061 Spinal stenosis, lumbar region without neurogenic claudication: Secondary | ICD-10-CM | POA: Diagnosis not present

## 2017-10-05 DIAGNOSIS — M5136 Other intervertebral disc degeneration, lumbar region: Secondary | ICD-10-CM | POA: Diagnosis not present

## 2017-10-05 DIAGNOSIS — M4726 Other spondylosis with radiculopathy, lumbar region: Secondary | ICD-10-CM | POA: Diagnosis not present

## 2017-10-05 DIAGNOSIS — Z981 Arthrodesis status: Secondary | ICD-10-CM | POA: Diagnosis not present

## 2017-10-13 DIAGNOSIS — M792 Neuralgia and neuritis, unspecified: Secondary | ICD-10-CM | POA: Diagnosis not present

## 2017-10-13 DIAGNOSIS — R252 Cramp and spasm: Secondary | ICD-10-CM | POA: Diagnosis not present

## 2017-11-01 DIAGNOSIS — H401222 Low-tension glaucoma, left eye, moderate stage: Secondary | ICD-10-CM | POA: Diagnosis not present

## 2017-11-01 DIAGNOSIS — H40011 Open angle with borderline findings, low risk, right eye: Secondary | ICD-10-CM | POA: Diagnosis not present

## 2017-11-01 DIAGNOSIS — H1851 Endothelial corneal dystrophy: Secondary | ICD-10-CM | POA: Diagnosis not present

## 2017-11-19 DIAGNOSIS — R252 Cramp and spasm: Secondary | ICD-10-CM | POA: Diagnosis not present

## 2017-11-26 DIAGNOSIS — I10 Essential (primary) hypertension: Secondary | ICD-10-CM | POA: Diagnosis not present

## 2018-02-01 DIAGNOSIS — Z1231 Encounter for screening mammogram for malignant neoplasm of breast: Secondary | ICD-10-CM | POA: Diagnosis not present

## 2018-04-06 DIAGNOSIS — I10 Essential (primary) hypertension: Secondary | ICD-10-CM | POA: Diagnosis not present

## 2018-04-06 DIAGNOSIS — E559 Vitamin D deficiency, unspecified: Secondary | ICD-10-CM | POA: Diagnosis not present

## 2018-04-06 DIAGNOSIS — E7800 Pure hypercholesterolemia, unspecified: Secondary | ICD-10-CM | POA: Diagnosis not present

## 2018-04-13 DIAGNOSIS — G2581 Restless legs syndrome: Secondary | ICD-10-CM | POA: Diagnosis not present

## 2018-04-13 DIAGNOSIS — M159 Polyosteoarthritis, unspecified: Secondary | ICD-10-CM | POA: Diagnosis not present

## 2018-04-13 DIAGNOSIS — R7989 Other specified abnormal findings of blood chemistry: Secondary | ICD-10-CM | POA: Diagnosis not present

## 2018-04-13 DIAGNOSIS — R001 Bradycardia, unspecified: Secondary | ICD-10-CM | POA: Diagnosis not present

## 2018-04-13 DIAGNOSIS — Z6836 Body mass index (BMI) 36.0-36.9, adult: Secondary | ICD-10-CM | POA: Diagnosis not present

## 2018-04-13 DIAGNOSIS — Z1212 Encounter for screening for malignant neoplasm of rectum: Secondary | ICD-10-CM | POA: Diagnosis not present

## 2018-04-13 DIAGNOSIS — I10 Essential (primary) hypertension: Secondary | ICD-10-CM | POA: Diagnosis not present

## 2018-04-13 DIAGNOSIS — Z01419 Encounter for gynecological examination (general) (routine) without abnormal findings: Secondary | ICD-10-CM | POA: Diagnosis not present

## 2018-04-13 DIAGNOSIS — Z0000 Encounter for general adult medical examination without abnormal findings: Secondary | ICD-10-CM | POA: Diagnosis not present

## 2018-04-21 DIAGNOSIS — M79642 Pain in left hand: Secondary | ICD-10-CM | POA: Diagnosis not present

## 2018-04-21 DIAGNOSIS — M549 Dorsalgia, unspecified: Secondary | ICD-10-CM | POA: Diagnosis not present

## 2018-04-21 DIAGNOSIS — R768 Other specified abnormal immunological findings in serum: Secondary | ICD-10-CM | POA: Diagnosis not present

## 2018-04-21 DIAGNOSIS — M1990 Unspecified osteoarthritis, unspecified site: Secondary | ICD-10-CM | POA: Diagnosis not present

## 2018-04-21 DIAGNOSIS — M19042 Primary osteoarthritis, left hand: Secondary | ICD-10-CM | POA: Diagnosis not present

## 2018-04-21 DIAGNOSIS — M79641 Pain in right hand: Secondary | ICD-10-CM | POA: Diagnosis not present

## 2018-04-21 DIAGNOSIS — M170 Bilateral primary osteoarthritis of knee: Secondary | ICD-10-CM | POA: Diagnosis not present

## 2018-04-21 DIAGNOSIS — M19041 Primary osteoarthritis, right hand: Secondary | ICD-10-CM | POA: Diagnosis not present

## 2018-04-21 DIAGNOSIS — M79643 Pain in unspecified hand: Secondary | ICD-10-CM | POA: Diagnosis not present

## 2018-04-21 DIAGNOSIS — M2560 Stiffness of unspecified joint, not elsewhere classified: Secondary | ICD-10-CM | POA: Diagnosis not present

## 2018-04-21 DIAGNOSIS — M25569 Pain in unspecified knee: Secondary | ICD-10-CM | POA: Diagnosis not present

## 2018-04-21 DIAGNOSIS — E669 Obesity, unspecified: Secondary | ICD-10-CM | POA: Diagnosis not present

## 2018-04-21 DIAGNOSIS — N189 Chronic kidney disease, unspecified: Secondary | ICD-10-CM | POA: Diagnosis not present

## 2018-04-22 DIAGNOSIS — Z780 Asymptomatic menopausal state: Secondary | ICD-10-CM | POA: Diagnosis not present

## 2018-05-13 DIAGNOSIS — M1990 Unspecified osteoarthritis, unspecified site: Secondary | ICD-10-CM | POA: Diagnosis not present

## 2018-05-13 DIAGNOSIS — E669 Obesity, unspecified: Secondary | ICD-10-CM | POA: Diagnosis not present

## 2018-05-13 DIAGNOSIS — M170 Bilateral primary osteoarthritis of knee: Secondary | ICD-10-CM | POA: Diagnosis not present

## 2018-05-13 DIAGNOSIS — N189 Chronic kidney disease, unspecified: Secondary | ICD-10-CM | POA: Diagnosis not present

## 2018-05-13 DIAGNOSIS — M79643 Pain in unspecified hand: Secondary | ICD-10-CM | POA: Diagnosis not present

## 2018-05-13 DIAGNOSIS — M549 Dorsalgia, unspecified: Secondary | ICD-10-CM | POA: Diagnosis not present

## 2018-05-13 DIAGNOSIS — M25569 Pain in unspecified knee: Secondary | ICD-10-CM | POA: Diagnosis not present

## 2018-05-13 DIAGNOSIS — M2560 Stiffness of unspecified joint, not elsewhere classified: Secondary | ICD-10-CM | POA: Diagnosis not present

## 2018-05-23 ENCOUNTER — Other Ambulatory Visit: Payer: Self-pay | Admitting: Internal Medicine

## 2018-05-23 DIAGNOSIS — M79605 Pain in left leg: Secondary | ICD-10-CM | POA: Diagnosis not present

## 2018-05-23 DIAGNOSIS — M79604 Pain in right leg: Secondary | ICD-10-CM

## 2018-05-23 DIAGNOSIS — M79672 Pain in left foot: Principal | ICD-10-CM

## 2018-05-23 DIAGNOSIS — M79671 Pain in right foot: Principal | ICD-10-CM

## 2018-05-24 ENCOUNTER — Ambulatory Visit
Admission: RE | Admit: 2018-05-24 | Discharge: 2018-05-24 | Disposition: A | Discharge status: Home / Self Care | Payer: Medicare Other | Source: Ambulatory Visit | Attending: Internal Medicine | Admitting: Internal Medicine

## 2018-05-24 DIAGNOSIS — M79671 Pain in right foot: Principal | ICD-10-CM

## 2018-05-24 DIAGNOSIS — M79672 Pain in left foot: Principal | ICD-10-CM

## 2018-05-24 DIAGNOSIS — M79605 Pain in left leg: Principal | ICD-10-CM

## 2018-05-24 DIAGNOSIS — M79661 Pain in right lower leg: Secondary | ICD-10-CM | POA: Diagnosis not present

## 2018-05-24 DIAGNOSIS — M79662 Pain in left lower leg: Secondary | ICD-10-CM | POA: Diagnosis not present

## 2018-05-24 DIAGNOSIS — M79604 Pain in right leg: Secondary | ICD-10-CM

## 2018-06-01 DIAGNOSIS — H401222 Low-tension glaucoma, left eye, moderate stage: Secondary | ICD-10-CM | POA: Diagnosis not present

## 2018-06-01 DIAGNOSIS — H35033 Hypertensive retinopathy, bilateral: Secondary | ICD-10-CM | POA: Diagnosis not present

## 2018-06-01 DIAGNOSIS — H40011 Open angle with borderline findings, low risk, right eye: Secondary | ICD-10-CM | POA: Diagnosis not present

## 2018-06-01 DIAGNOSIS — H3509 Other intraretinal microvascular abnormalities: Secondary | ICD-10-CM | POA: Diagnosis not present

## 2018-06-20 DIAGNOSIS — J069 Acute upper respiratory infection, unspecified: Secondary | ICD-10-CM | POA: Diagnosis not present

## 2018-07-22 DIAGNOSIS — M79643 Pain in unspecified hand: Secondary | ICD-10-CM | POA: Diagnosis not present

## 2018-07-22 DIAGNOSIS — M1990 Unspecified osteoarthritis, unspecified site: Secondary | ICD-10-CM | POA: Diagnosis not present

## 2018-07-22 DIAGNOSIS — M25569 Pain in unspecified knee: Secondary | ICD-10-CM | POA: Diagnosis not present

## 2018-07-22 DIAGNOSIS — M549 Dorsalgia, unspecified: Secondary | ICD-10-CM | POA: Diagnosis not present

## 2018-07-22 DIAGNOSIS — E669 Obesity, unspecified: Secondary | ICD-10-CM | POA: Diagnosis not present

## 2018-07-22 DIAGNOSIS — M170 Bilateral primary osteoarthritis of knee: Secondary | ICD-10-CM | POA: Diagnosis not present

## 2018-07-22 DIAGNOSIS — M2560 Stiffness of unspecified joint, not elsewhere classified: Secondary | ICD-10-CM | POA: Diagnosis not present

## 2018-07-22 DIAGNOSIS — N189 Chronic kidney disease, unspecified: Secondary | ICD-10-CM | POA: Diagnosis not present

## 2018-11-21 DIAGNOSIS — L255 Unspecified contact dermatitis due to plants, except food: Secondary | ICD-10-CM | POA: Diagnosis not present

## 2019-01-17 DIAGNOSIS — M1990 Unspecified osteoarthritis, unspecified site: Secondary | ICD-10-CM | POA: Diagnosis not present

## 2019-01-17 DIAGNOSIS — M79643 Pain in unspecified hand: Secondary | ICD-10-CM | POA: Diagnosis not present

## 2019-01-17 DIAGNOSIS — M25569 Pain in unspecified knee: Secondary | ICD-10-CM | POA: Diagnosis not present

## 2019-01-17 DIAGNOSIS — E669 Obesity, unspecified: Secondary | ICD-10-CM | POA: Diagnosis not present

## 2019-01-17 DIAGNOSIS — M549 Dorsalgia, unspecified: Secondary | ICD-10-CM | POA: Diagnosis not present

## 2019-01-17 DIAGNOSIS — I10 Essential (primary) hypertension: Secondary | ICD-10-CM | POA: Diagnosis not present

## 2019-01-17 DIAGNOSIS — N189 Chronic kidney disease, unspecified: Secondary | ICD-10-CM | POA: Diagnosis not present

## 2019-01-17 DIAGNOSIS — M170 Bilateral primary osteoarthritis of knee: Secondary | ICD-10-CM | POA: Diagnosis not present

## 2019-01-17 DIAGNOSIS — M2560 Stiffness of unspecified joint, not elsewhere classified: Secondary | ICD-10-CM | POA: Diagnosis not present

## 2019-02-03 DIAGNOSIS — Z1231 Encounter for screening mammogram for malignant neoplasm of breast: Secondary | ICD-10-CM | POA: Diagnosis not present

## 2019-02-10 DIAGNOSIS — H40011 Open angle with borderline findings, low risk, right eye: Secondary | ICD-10-CM | POA: Diagnosis not present

## 2019-02-10 DIAGNOSIS — H1851 Endothelial corneal dystrophy: Secondary | ICD-10-CM | POA: Diagnosis not present

## 2019-02-10 DIAGNOSIS — H401220 Low-tension glaucoma, left eye, stage unspecified: Secondary | ICD-10-CM | POA: Diagnosis not present

## 2019-02-14 DIAGNOSIS — M2560 Stiffness of unspecified joint, not elsewhere classified: Secondary | ICD-10-CM | POA: Diagnosis not present

## 2019-02-14 DIAGNOSIS — N189 Chronic kidney disease, unspecified: Secondary | ICD-10-CM | POA: Diagnosis not present

## 2019-02-14 DIAGNOSIS — M549 Dorsalgia, unspecified: Secondary | ICD-10-CM | POA: Diagnosis not present

## 2019-02-14 DIAGNOSIS — M1990 Unspecified osteoarthritis, unspecified site: Secondary | ICD-10-CM | POA: Diagnosis not present

## 2019-02-14 DIAGNOSIS — M25569 Pain in unspecified knee: Secondary | ICD-10-CM | POA: Diagnosis not present

## 2019-02-14 DIAGNOSIS — E669 Obesity, unspecified: Secondary | ICD-10-CM | POA: Diagnosis not present

## 2019-02-14 DIAGNOSIS — M79643 Pain in unspecified hand: Secondary | ICD-10-CM | POA: Diagnosis not present

## 2019-02-14 DIAGNOSIS — M170 Bilateral primary osteoarthritis of knee: Secondary | ICD-10-CM | POA: Diagnosis not present

## 2019-02-17 ENCOUNTER — Other Ambulatory Visit: Payer: Self-pay

## 2019-02-22 DIAGNOSIS — M479 Spondylosis, unspecified: Secondary | ICD-10-CM | POA: Diagnosis not present

## 2019-03-14 DIAGNOSIS — H9193 Unspecified hearing loss, bilateral: Secondary | ICD-10-CM | POA: Diagnosis not present

## 2019-03-14 DIAGNOSIS — L299 Pruritus, unspecified: Secondary | ICD-10-CM | POA: Diagnosis not present

## 2019-03-15 DIAGNOSIS — M2560 Stiffness of unspecified joint, not elsewhere classified: Secondary | ICD-10-CM | POA: Diagnosis not present

## 2019-03-15 DIAGNOSIS — M79643 Pain in unspecified hand: Secondary | ICD-10-CM | POA: Diagnosis not present

## 2019-03-15 DIAGNOSIS — M1990 Unspecified osteoarthritis, unspecified site: Secondary | ICD-10-CM | POA: Diagnosis not present

## 2019-03-15 DIAGNOSIS — M549 Dorsalgia, unspecified: Secondary | ICD-10-CM | POA: Diagnosis not present

## 2019-03-15 DIAGNOSIS — M170 Bilateral primary osteoarthritis of knee: Secondary | ICD-10-CM | POA: Diagnosis not present

## 2019-03-15 DIAGNOSIS — M25569 Pain in unspecified knee: Secondary | ICD-10-CM | POA: Diagnosis not present

## 2019-03-15 DIAGNOSIS — N189 Chronic kidney disease, unspecified: Secondary | ICD-10-CM | POA: Diagnosis not present

## 2019-03-15 DIAGNOSIS — E669 Obesity, unspecified: Secondary | ICD-10-CM | POA: Diagnosis not present

## 2019-04-03 DIAGNOSIS — H903 Sensorineural hearing loss, bilateral: Secondary | ICD-10-CM | POA: Diagnosis not present

## 2019-04-11 DIAGNOSIS — Z0000 Encounter for general adult medical examination without abnormal findings: Secondary | ICD-10-CM | POA: Diagnosis not present

## 2019-04-11 DIAGNOSIS — I10 Essential (primary) hypertension: Secondary | ICD-10-CM | POA: Diagnosis not present

## 2019-04-11 DIAGNOSIS — R7989 Other specified abnormal findings of blood chemistry: Secondary | ICD-10-CM | POA: Diagnosis not present

## 2019-04-11 DIAGNOSIS — E7800 Pure hypercholesterolemia, unspecified: Secondary | ICD-10-CM | POA: Diagnosis not present

## 2019-04-12 DIAGNOSIS — E669 Obesity, unspecified: Secondary | ICD-10-CM | POA: Diagnosis not present

## 2019-04-12 DIAGNOSIS — M25569 Pain in unspecified knee: Secondary | ICD-10-CM | POA: Diagnosis not present

## 2019-04-12 DIAGNOSIS — M1990 Unspecified osteoarthritis, unspecified site: Secondary | ICD-10-CM | POA: Diagnosis not present

## 2019-04-12 DIAGNOSIS — M2560 Stiffness of unspecified joint, not elsewhere classified: Secondary | ICD-10-CM | POA: Diagnosis not present

## 2019-04-12 DIAGNOSIS — M170 Bilateral primary osteoarthritis of knee: Secondary | ICD-10-CM | POA: Diagnosis not present

## 2019-04-12 DIAGNOSIS — N189 Chronic kidney disease, unspecified: Secondary | ICD-10-CM | POA: Diagnosis not present

## 2019-04-12 DIAGNOSIS — M549 Dorsalgia, unspecified: Secondary | ICD-10-CM | POA: Diagnosis not present

## 2019-04-12 DIAGNOSIS — M79643 Pain in unspecified hand: Secondary | ICD-10-CM | POA: Diagnosis not present

## 2019-04-18 DIAGNOSIS — E559 Vitamin D deficiency, unspecified: Secondary | ICD-10-CM | POA: Diagnosis not present

## 2019-04-18 DIAGNOSIS — Z23 Encounter for immunization: Secondary | ICD-10-CM | POA: Diagnosis not present

## 2019-04-18 DIAGNOSIS — Z1212 Encounter for screening for malignant neoplasm of rectum: Secondary | ICD-10-CM | POA: Diagnosis not present

## 2019-04-18 DIAGNOSIS — Z0000 Encounter for general adult medical examination without abnormal findings: Secondary | ICD-10-CM | POA: Diagnosis not present

## 2019-04-18 DIAGNOSIS — Z01419 Encounter for gynecological examination (general) (routine) without abnormal findings: Secondary | ICD-10-CM | POA: Diagnosis not present

## 2019-04-18 DIAGNOSIS — I10 Essential (primary) hypertension: Secondary | ICD-10-CM | POA: Diagnosis not present

## 2019-06-12 ENCOUNTER — Other Ambulatory Visit: Payer: Self-pay

## 2019-06-12 DIAGNOSIS — M1990 Unspecified osteoarthritis, unspecified site: Secondary | ICD-10-CM | POA: Diagnosis not present

## 2019-06-12 DIAGNOSIS — M25569 Pain in unspecified knee: Secondary | ICD-10-CM | POA: Diagnosis not present

## 2019-06-12 DIAGNOSIS — N183 Chronic kidney disease, stage 3 unspecified: Secondary | ICD-10-CM | POA: Diagnosis not present

## 2019-06-12 DIAGNOSIS — Z20822 Contact with and (suspected) exposure to covid-19: Secondary | ICD-10-CM

## 2019-06-12 DIAGNOSIS — M0609 Rheumatoid arthritis without rheumatoid factor, multiple sites: Secondary | ICD-10-CM | POA: Diagnosis not present

## 2019-06-12 DIAGNOSIS — Z20828 Contact with and (suspected) exposure to other viral communicable diseases: Secondary | ICD-10-CM | POA: Diagnosis not present

## 2019-06-12 DIAGNOSIS — M7989 Other specified soft tissue disorders: Secondary | ICD-10-CM | POA: Diagnosis not present

## 2019-06-12 DIAGNOSIS — Z79899 Other long term (current) drug therapy: Secondary | ICD-10-CM | POA: Diagnosis not present

## 2019-06-12 DIAGNOSIS — M25529 Pain in unspecified elbow: Secondary | ICD-10-CM | POA: Diagnosis not present

## 2019-06-12 DIAGNOSIS — M79643 Pain in unspecified hand: Secondary | ICD-10-CM | POA: Diagnosis not present

## 2019-06-13 LAB — NOVEL CORONAVIRUS, NAA: SARS-CoV-2, NAA: NOT DETECTED

## 2019-06-22 DIAGNOSIS — M0609 Rheumatoid arthritis without rheumatoid factor, multiple sites: Secondary | ICD-10-CM | POA: Diagnosis not present

## 2019-07-17 ENCOUNTER — Ambulatory Visit: Payer: Medicare Other | Attending: Internal Medicine

## 2019-07-17 DIAGNOSIS — Z20822 Contact with and (suspected) exposure to covid-19: Secondary | ICD-10-CM

## 2019-07-19 LAB — NOVEL CORONAVIRUS, NAA: SARS-CoV-2, NAA: NOT DETECTED

## 2019-08-15 DIAGNOSIS — M25529 Pain in unspecified elbow: Secondary | ICD-10-CM | POA: Diagnosis not present

## 2019-08-15 DIAGNOSIS — N183 Chronic kidney disease, stage 3 unspecified: Secondary | ICD-10-CM | POA: Diagnosis not present

## 2019-08-15 DIAGNOSIS — M1990 Unspecified osteoarthritis, unspecified site: Secondary | ICD-10-CM | POA: Diagnosis not present

## 2019-08-15 DIAGNOSIS — M25569 Pain in unspecified knee: Secondary | ICD-10-CM | POA: Diagnosis not present

## 2019-08-15 DIAGNOSIS — M0609 Rheumatoid arthritis without rheumatoid factor, multiple sites: Secondary | ICD-10-CM | POA: Diagnosis not present

## 2019-08-15 DIAGNOSIS — M79643 Pain in unspecified hand: Secondary | ICD-10-CM | POA: Diagnosis not present

## 2019-08-15 DIAGNOSIS — M7989 Other specified soft tissue disorders: Secondary | ICD-10-CM | POA: Diagnosis not present

## 2019-08-15 DIAGNOSIS — Z79899 Other long term (current) drug therapy: Secondary | ICD-10-CM | POA: Diagnosis not present

## 2019-08-23 DIAGNOSIS — M0609 Rheumatoid arthritis without rheumatoid factor, multiple sites: Secondary | ICD-10-CM | POA: Diagnosis not present

## 2019-08-29 ENCOUNTER — Ambulatory Visit: Payer: Medicare Other

## 2019-08-31 ENCOUNTER — Ambulatory Visit: Payer: Medicare Other

## 2019-09-10 ENCOUNTER — Ambulatory Visit: Payer: Medicare Other | Attending: Internal Medicine

## 2019-09-10 DIAGNOSIS — Z23 Encounter for immunization: Secondary | ICD-10-CM | POA: Insufficient documentation

## 2019-09-10 NOTE — Progress Notes (Signed)
   Covid-19 Vaccination Clinic  Name:  Melissa Maldonado    MRN: [id number] DOB: 1941-02-14  09/10/2019  [name] was observed post Covid-19 immunization for 15 minutes without incidence. She was provided with Vaccine Information Sheet and instruction to access the V-Safe system.   [name] was instructed to call 911 with any severe reactions post vaccine: Marland Kitchen Difficulty breathing  . Swelling of your face and throat  . A fast heartbeat  . A bad rash all over your body  . Dizziness and weakness    Immunizations Administered    Name Date Dose VIS Date Route   Pfizer COVID-19 Vaccine 09/10/2019 11:53 AM 0.3 mL 06/30/2019 Intramuscular   Manufacturer: [place]   Lot: [id number]   [place]: [address]-4

## 2019-10-03 DIAGNOSIS — M1711 Unilateral primary osteoarthritis, right knee: Secondary | ICD-10-CM | POA: Diagnosis not present

## 2019-10-03 DIAGNOSIS — R269 Unspecified abnormalities of gait and mobility: Secondary | ICD-10-CM | POA: Diagnosis not present

## 2019-10-03 DIAGNOSIS — M25561 Pain in right knee: Secondary | ICD-10-CM | POA: Diagnosis not present

## 2019-10-03 DIAGNOSIS — M25551 Pain in right hip: Secondary | ICD-10-CM | POA: Diagnosis not present

## 2019-10-03 DIAGNOSIS — M1991 Primary osteoarthritis, unspecified site: Secondary | ICD-10-CM | POA: Diagnosis not present

## 2019-10-03 DIAGNOSIS — M6281 Muscle weakness (generalized): Secondary | ICD-10-CM | POA: Diagnosis not present

## 2019-10-04 ENCOUNTER — Ambulatory Visit: Payer: Medicare Other | Attending: Internal Medicine

## 2019-10-04 DIAGNOSIS — Z23 Encounter for immunization: Secondary | ICD-10-CM

## 2019-10-04 NOTE — Progress Notes (Signed)
   Covid-19 Vaccination Clinic  Name:  Melissa Maldonado    MRN: [id number] DOB: Mar 29, 1941  10/04/2019  [name] was observed post Covid-19 immunization for 15 minutes without incident. She was provided with Vaccine Information Sheet and instruction to access the V-Safe system.   [name] was instructed to call 911 with any severe reactions post vaccine: Marland Kitchen Difficulty breathing  . Swelling of face and throat  . A fast heartbeat  . A bad rash all over body  . Dizziness and weakness   Immunizations Administered    Name Date Dose VIS Date Route   Pfizer COVID-19 Vaccine 10/04/2019  2:50 PM 0.3 mL 06/30/2019 Intramuscular   Manufacturer: [place]   Lot: [id number]   [place]: [address]-0

## 2019-10-10 DIAGNOSIS — M1991 Primary osteoarthritis, unspecified site: Secondary | ICD-10-CM | POA: Diagnosis not present

## 2019-10-10 DIAGNOSIS — M6281 Muscle weakness (generalized): Secondary | ICD-10-CM | POA: Diagnosis not present

## 2019-10-10 DIAGNOSIS — R269 Unspecified abnormalities of gait and mobility: Secondary | ICD-10-CM | POA: Diagnosis not present

## 2019-10-10 DIAGNOSIS — M25551 Pain in right hip: Secondary | ICD-10-CM | POA: Diagnosis not present

## 2019-10-12 DIAGNOSIS — M1991 Primary osteoarthritis, unspecified site: Secondary | ICD-10-CM | POA: Diagnosis not present

## 2019-10-12 DIAGNOSIS — R269 Unspecified abnormalities of gait and mobility: Secondary | ICD-10-CM | POA: Diagnosis not present

## 2019-10-12 DIAGNOSIS — M25551 Pain in right hip: Secondary | ICD-10-CM | POA: Diagnosis not present

## 2019-10-12 DIAGNOSIS — M6281 Muscle weakness (generalized): Secondary | ICD-10-CM | POA: Diagnosis not present

## 2019-10-17 DIAGNOSIS — M25551 Pain in right hip: Secondary | ICD-10-CM | POA: Diagnosis not present

## 2019-10-17 DIAGNOSIS — R269 Unspecified abnormalities of gait and mobility: Secondary | ICD-10-CM | POA: Diagnosis not present

## 2019-10-17 DIAGNOSIS — M6281 Muscle weakness (generalized): Secondary | ICD-10-CM | POA: Diagnosis not present

## 2019-10-17 DIAGNOSIS — M1991 Primary osteoarthritis, unspecified site: Secondary | ICD-10-CM | POA: Diagnosis not present

## 2019-10-18 DIAGNOSIS — M0609 Rheumatoid arthritis without rheumatoid factor, multiple sites: Secondary | ICD-10-CM | POA: Diagnosis not present

## 2019-10-24 DIAGNOSIS — R269 Unspecified abnormalities of gait and mobility: Secondary | ICD-10-CM | POA: Diagnosis not present

## 2019-10-24 DIAGNOSIS — M1991 Primary osteoarthritis, unspecified site: Secondary | ICD-10-CM | POA: Diagnosis not present

## 2019-10-24 DIAGNOSIS — M25551 Pain in right hip: Secondary | ICD-10-CM | POA: Diagnosis not present

## 2019-10-24 DIAGNOSIS — M6281 Muscle weakness (generalized): Secondary | ICD-10-CM | POA: Diagnosis not present

## 2019-10-26 DIAGNOSIS — R269 Unspecified abnormalities of gait and mobility: Secondary | ICD-10-CM | POA: Diagnosis not present

## 2019-10-26 DIAGNOSIS — M6281 Muscle weakness (generalized): Secondary | ICD-10-CM | POA: Diagnosis not present

## 2019-10-26 DIAGNOSIS — M1991 Primary osteoarthritis, unspecified site: Secondary | ICD-10-CM | POA: Diagnosis not present

## 2019-10-26 DIAGNOSIS — M25551 Pain in right hip: Secondary | ICD-10-CM | POA: Diagnosis not present

## 2019-11-14 DIAGNOSIS — M79643 Pain in unspecified hand: Secondary | ICD-10-CM | POA: Diagnosis not present

## 2019-11-14 DIAGNOSIS — N183 Chronic kidney disease, stage 3 unspecified: Secondary | ICD-10-CM | POA: Diagnosis not present

## 2019-11-14 DIAGNOSIS — Z79899 Other long term (current) drug therapy: Secondary | ICD-10-CM | POA: Diagnosis not present

## 2019-11-14 DIAGNOSIS — M25569 Pain in unspecified knee: Secondary | ICD-10-CM | POA: Diagnosis not present

## 2019-11-14 DIAGNOSIS — M0609 Rheumatoid arthritis without rheumatoid factor, multiple sites: Secondary | ICD-10-CM | POA: Diagnosis not present

## 2019-11-14 DIAGNOSIS — M1990 Unspecified osteoarthritis, unspecified site: Secondary | ICD-10-CM | POA: Diagnosis not present

## 2019-11-14 DIAGNOSIS — R700 Elevated erythrocyte sedimentation rate: Secondary | ICD-10-CM | POA: Diagnosis not present

## 2019-11-14 DIAGNOSIS — M25529 Pain in unspecified elbow: Secondary | ICD-10-CM | POA: Diagnosis not present

## 2019-11-14 DIAGNOSIS — M7989 Other specified soft tissue disorders: Secondary | ICD-10-CM | POA: Diagnosis not present

## 2019-11-15 DIAGNOSIS — M0609 Rheumatoid arthritis without rheumatoid factor, multiple sites: Secondary | ICD-10-CM | POA: Diagnosis not present

## 2020-01-11 DIAGNOSIS — R0600 Dyspnea, unspecified: Secondary | ICD-10-CM | POA: Diagnosis not present

## 2020-01-11 DIAGNOSIS — M545 Low back pain: Secondary | ICD-10-CM | POA: Diagnosis not present

## 2020-01-11 DIAGNOSIS — G8929 Other chronic pain: Secondary | ICD-10-CM | POA: Diagnosis not present

## 2020-01-11 DIAGNOSIS — R001 Bradycardia, unspecified: Secondary | ICD-10-CM | POA: Diagnosis not present

## 2020-01-12 ENCOUNTER — Other Ambulatory Visit: Payer: Self-pay

## 2020-01-12 ENCOUNTER — Encounter: Payer: Self-pay | Admitting: Cardiovascular Disease

## 2020-01-12 ENCOUNTER — Ambulatory Visit (INDEPENDENT_AMBULATORY_CARE_PROVIDER_SITE_OTHER): Payer: Medicare Other | Admitting: Cardiovascular Disease

## 2020-01-12 DIAGNOSIS — R0600 Dyspnea, unspecified: Secondary | ICD-10-CM

## 2020-01-12 DIAGNOSIS — I10 Essential (primary) hypertension: Secondary | ICD-10-CM | POA: Insufficient documentation

## 2020-01-12 DIAGNOSIS — R001 Bradycardia, unspecified: Secondary | ICD-10-CM

## 2020-01-12 DIAGNOSIS — R0609 Other forms of dyspnea: Secondary | ICD-10-CM

## 2020-01-12 NOTE — Assessment & Plan Note (Signed)
Melissa Maldonado was referred to me by Dr. Shelia Media for evaluation of dyspnea on exertion.  She has no history of tobacco abuse or any other pulmonary issues.  She is noticed increasing dyspnea on exertion for the last 6 months when walking upstairs.  Negative 2D echo and a coronary calcium score to further evaluate

## 2020-01-12 NOTE — Progress Notes (Signed)
01/12/2020 RASHAD OBEID   September 22, 1940  [id number]  Primary Physician [name], MD Primary Cardiologist: Lorretta Harp MD Lupe Carney, [place]  HPI:  Melissa Maldonado is a 79 y.o. 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 Shelia Media, her PCP, for evaluation of progressive dyspnea on exertion.  I apparently saw her many years ago and did a MET test which was abnormal.  She does have treated hypertension.  There is no family history of heart disease.  She is never had a heart attack or stroke.  She denies chest pain.  She is noticed increasing dyspnea on exertion for last 6 months especially walking up stairs although she does also admit to having gained some weight as well.   Current Meds  Medication Sig  . ACETAMINOPHEN 8 HOUR PO Take 1 tablet by mouth.  . Diphenhydramine-APAP (ACETA-GESIC) 12.5-325 MG TABS Take by mouth.  Marland Kitchen rOPINIRole (REQUIP) 0.5 MG tablet Take 0.5 mg by mouth at bedtime.  . valsartan-hydrochlorothiazide (DIOVAN-HCT) 320-25 MG tablet Take 1 tablet by mouth daily.  . vitamin B-12 (CYANOCOBALAMIN) 1000 MCG tablet Take 1,000 mcg by mouth daily.  . [DISCONTINUED] HYDROcodone-acetaminophen (NORCO/VICODIN) 5-325 MG tablet Take 1-2 tablets by mouth every 4 (four) hours as needed for severe pain.  . [DISCONTINUED] meloxicam (MOBIC) 15 MG tablet TAKE 1 TABLET BY MOUTH EVERY DAY  . [DISCONTINUED] methocarbamol (ROBAXIN) 500 MG tablet Take 1 tablet (500 mg total) by mouth every 6 (six) hours as needed for muscle spasms.  . [DISCONTINUED] Multiple Vitamins-Minerals (MULTIVITAMIN PO) Take 2 tablets by mouth daily.  . [DISCONTINUED] naproxen sodium (ANAPROX) 220 MG tablet Take 440 mg by mouth daily.     Allergies  Allergen Reactions  . Aspirin Other (See Comments)    "knocked out"  . Codeine Nausea Only    Social  History   Socioeconomic History  . Marital status: Married    Spouse name: Not on file  . Number of children: Not on file  . Years of education: Not on file  . Highest education level: Not on file  Occupational History  . Not on file  Tobacco Use  . Smoking status: Never Smoker  . Smokeless tobacco: Never Used  Substance and Sexual Activity  . Alcohol use: Yes    Comment: socially wine  . Drug use: No  . Sexual activity: Yes    Birth control/protection: Post-menopausal  Other Topics Concern  . Not on file  Social History Narrative  . Not on file   Social Determinants of Health   Financial Resource Strain:   . Difficulty of Paying Living Expenses:   Food Insecurity:   . Worried About Charity fundraiser in the Last Year:   . Arboriculturist in the Last Year:   Transportation Needs:   . Film/video editor (Medical):   Marland Kitchen Lack of Transportation (Non-Medical):   Physical Activity:   . Days of Exercise per Week:   . Minutes of Exercise per Session:   Stress:   . Feeling of Stress :   Social Connections:   . Frequency of Communication with Friends and Family:   . Frequency of Social Gatherings with Friends and Family:   . Attends Religious Services:   . Active Member of Clubs or Organizations:   . Attends Archivist  Meetings:   Marland Kitchen Marital Status:   Intimate Partner Violence:   . Fear of Current or Ex-Partner:   . Emotionally Abused:   Marland Kitchen Physically Abused:   . Sexually Abused:      Review of Systems: General: negative for chills, fever, night sweats or weight changes.  Cardiovascular: negative for chest pain, dyspnea on exertion, edema, orthopnea, palpitations, paroxysmal nocturnal dyspnea or shortness of breath Dermatological: negative for rash Respiratory: negative for cough or wheezing Urologic: negative for hematuria Abdominal: negative for nausea, vomiting, diarrhea, bright red blood per rectum, melena, or hematemesis Neurologic: negative for  visual changes, syncope, or dizziness All other systems reviewed and are otherwise negative except as noted above.    Blood pressure (!) 146/70, pulse (!) 43, height 5' 5"  (1.651 m), weight 214 lb (97.1 kg).  General appearance: alert and no distress Neck: no adenopathy, no carotid bruit, no JVD, supple, symmetrical, trachea midline and thyroid not enlarged, symmetric, no tenderness/mass/nodules Lungs: clear to auscultation bilaterally Heart: regular rate and rhythm, S1, S2 normal, no murmur, click, rub or gallop Extremities: extremities normal, atraumatic, no cyanosis or edema Pulses: 2+ and symmetric Skin: Skin color, texture, turgor normal. No rashes or lesions Neurologic: Alert and oriented X 3, normal strength and tone. Normal symmetric reflexes. Normal coordination and gait  EKG Marked sinus bradycardia 43 with poor R wave progression.  I personally reviewed this EKG.  ASSESSMENT AND PLAN:   Essential hypertension History of essential hypertension on valsartan and hydrochlorothiazide with blood pressure measured today 146/70.  Sinus bradycardia Long history of sinus bradycardia with heart rate today of 43.  She is not on any negative chronotropic drugs.  She is asymptomatic from this.  Dr. Shelia Media did put a 1 week Zio patch on to further evaluate  Dyspnea on exertion Ms. Crandle was referred to me by Dr. Shelia Media for evaluation of dyspnea on exertion.  She has no history of tobacco abuse or any other pulmonary issues.  She is noticed increasing dyspnea on exertion for the last 6 months when walking upstairs.  Negative 2D echo and a coronary calcium score to further evaluate      Lorretta Harp MD [facility], [facility] 01/12/2020 10:46 AM

## 2020-01-12 NOTE — Assessment & Plan Note (Signed)
History of essential hypertension on valsartan and hydrochlorothiazide with blood pressure measured today 146/70.

## 2020-01-12 NOTE — Patient Instructions (Signed)
Medication Instructions:  Your Physician recommend you continue on your current medication as directed.    *If you need a refill on your cardiac medications before your next appointment, please call your pharmacy*   Lab Work: None    Testing/Procedures: Your physician has requested that you have an echocardiogram. Echocardiography is a painless test that uses sound waves to create images of your heart. It provides your doctor with information about the size and shape of your heart and how well your heart's chambers and valves are working. This procedure takes approximately one hour. There are no restrictions for this procedure. [place] [address]  Calcium Score 1126 [place] [address]   Follow-Up: At [facility], you and your health needs are our priority.  As part of our continuing mission to provide you with exceptional heart care, we have created designated Provider Care Teams.  These Care Teams include your primary Cardiologist (physician) and Advanced Practice Providers (APPs -  Physician Assistants and Nurse Practitioners) who all work together to provide you with the care you need, when you need it.  We recommend signing up for the patient portal called "MyChart".  Sign up information is provided on this After Visit Summary.  MyChart is used to connect with patients for Virtual Visits (Telemedicine).  Patients are able to view lab/test results, encounter notes, upcoming appointments, etc.  Non-urgent messages can be sent to your provider as well.   To learn more about what you can do with MyChart, go to [email or web address].    Your next appointment:   As Needed  The format for your next appointment:   In Person  Provider:   Quay Burow, MD

## 2020-01-12 NOTE — Assessment & Plan Note (Signed)
Long history of sinus bradycardia with heart rate today of 43.  She is not on any negative chronotropic drugs.  She is asymptomatic from this.  Dr. Shelia Media did put a 1 week Zio patch on to further evaluate

## 2020-01-15 ENCOUNTER — Ambulatory Visit (INDEPENDENT_AMBULATORY_CARE_PROVIDER_SITE_OTHER): Payer: Medicare Other | Admitting: Family Medicine

## 2020-01-15 ENCOUNTER — Encounter: Payer: Self-pay | Admitting: Family Medicine

## 2020-01-15 ENCOUNTER — Other Ambulatory Visit: Payer: Self-pay

## 2020-01-15 VITALS — BP 128/70 | HR 40 | Ht 65.0 in | Wt 215.0 lb

## 2020-01-15 DIAGNOSIS — M48062 Spinal stenosis, lumbar region with neurogenic claudication: Secondary | ICD-10-CM

## 2020-01-15 NOTE — Patient Instructions (Signed)
Thank you for coming in today. Plan for injection.  Call [facility] to schedule [phone].   You will need someone to drive you home after the shot.   Let me know how you feel a few days after the shot.  Could do further test if needed.

## 2020-01-15 NOTE — Progress Notes (Signed)
Subjective:    CC: Low back pain  I, Melissa Maldonado, LAT, ATC, am serving as scribe for Dr. [name].  HPI: Pt is a 79 y/o female presenting w/ chronic low back pain.  She has a hx of 2 back surgeries in 2010 and again in 2013.  Radiating pain: yes, mainly into her R LE to her R ankle LE numbness/tingling: R great toe LE weakness: no Aggravating factors: constant pain Treatments tried:  2 prior back surgeries; injections;   Diagnostic imaging: CT L-spine w/o contrast- 09/15/17; L-spine XR- 02/01/17; CT L-spine w/ contrast - 11/25/16  Pertinent review of Systems: No fevers or chills  Relevant historical information: Hypertension   Objective:    Vitals:   01/15/20 0901  BP: 128/70  Pulse: (!) 40  SpO2: 96%   General: Well Developed, well nourished, and in no acute distress.   MSK:  L-spine: Nontender midline.  Decreased lumbar motion. Lower extremity strength reflexes and sensation are equal normal throughout. Normal gait.  Lab and Radiology Results  EXAM: CT LUMBAR SPINE WITHOUT CONTRAST  TECHNIQUE: Multidetector CT imaging of the lumbar spine was performed without intravenous contrast administration. Multiplanar CT image reconstructions were also generated.  COMPARISON:  Lumbar myelogram 11/25/2016.  FINDINGS: Segmentation: Standard.  Alignment: No unfused malalignment.  Vertebrae: There is L1 to L5 solid interbody and posterolateral arthrodesis. Rod and pedicle screws present at L1-2 and L4-5. Previously removed pedicle screws at L3. No fracture, discitis, or aggressive bone lesion. Bilateral sacroiliac degeneration with vacuum phenomenon.  Paraspinal and other soft tissues: Expected postoperative scarring in the visualized back soft tissues.  Disc levels:  T12- L1: Supraspinous ligament ossification and intraspinous ossification. No adjacent segment degeneration or visible impingement.  L1-L2: Interval fusion with solid  arthrodesis. Improved foraminal patency. Mild spinal stenosis from residual endplate ridging. Chronic osteitis about the fused disc space.  L2-L3: Solid arthrodesis.  No evidence of residual impingement  L3-L4: Solid arthrodesis.  No evidence of residual impingement  L4-L5: Solid arthrodesis.  No evidence of residual impingement  L5-S1:Advanced facet arthropathy with spurring and left-sided joint effusion. Moderate disc narrowing with endplate spurring. There is pseudoarthrosis between the right transverse process and sacrum with spurring impinging on the extraforaminal right L5 nerve. No canal or foraminal patency.  IMPRESSION: 1. L1-L5 solid arthrodesis. Improved canal and foraminal patency at L1-2 when compared to preoperative study 11/25/2016. 2. L5-S1 advanced facet arthropathy and moderate disc degeneration. No canal or foraminal impingement at this level. 3. There is degenerated pseudoarticulation between the right L5 transverse process and sacrum with spurring impinging on the right L5 nerve. 4. No interval adjacent segment degeneration at T12-L1.   Electronically Signed   By: Monte Fantasia M.D.   On: 09/15/2017 12:47  I, [name], personally (independently) visualized and performed the interpretation of the images attached in this note.    Impression and Recommendations:    Assessment and Plan: 79 y.o. female with right low back pain radiating right leg and L5 dermatomal pattern.  She has history of significant surgeries and multiple postsurgical degenerative changes on L-spine CT scan most recently 2019.  This looks concerning for a right L5 nerve impingement which matches her current symptoms.  Plan for trial of epidural steroid injection at this level.  If not very effective will repeat CT myelogram to reevaluate current state and her back.  Patient will see me update after epidural steroid injection.Marland Kitchen  PDMP not reviewed this encounter. Orders Placed  This Encounter  Procedures  . DG INJECT DIAG/THERA/INC NEEDLE/CATH/PLC EPI/LUMB/SAC W/IMG    Standing Status:   Future    Standing Expiration Date:   01/14/2021    Order Specific Question:   Reason for Exam (SYMPTOM  OR DIAGNOSIS REQUIRED)    Answer:   Rt L5 nerve impingment based on current symptoms and CT Lspein 2019. Level and technique per radiology    Order Specific Question:   Preferred Imaging Location?    Answer:   [facility]    Order Specific Question:   Radiology Contrast Protocol - do NOT remove file path    Answer:   [email or web address]   No orders of the defined types were placed in this encounter.   Discussed warning signs or symptoms. Please see discharge instructions. Patient expresses understanding.   The above documentation has been reviewed and is accurate and complete [name], M.D.

## 2020-01-18 ENCOUNTER — Ambulatory Visit
Admission: RE | Admit: 2020-01-18 | Discharge: 2020-01-18 | Disposition: A | Discharge status: Home / Self Care | Payer: Medicare Other | Source: Ambulatory Visit | Attending: Family Medicine | Admitting: Family Medicine

## 2020-01-18 ENCOUNTER — Other Ambulatory Visit: Payer: Self-pay

## 2020-01-18 DIAGNOSIS — M48062 Spinal stenosis, lumbar region with neurogenic claudication: Secondary | ICD-10-CM

## 2020-01-18 DIAGNOSIS — M5416 Radiculopathy, lumbar region: Secondary | ICD-10-CM | POA: Diagnosis not present

## 2020-01-18 MED ORDER — IOPAMIDOL (ISOVUE-M 200) INJECTION 41%
1.0000 mL | Freq: Once | INTRAMUSCULAR | Status: DC
  Administered 2020-01-18: 1 mL via EPIDURAL

## 2020-01-18 MED ORDER — METHYLPREDNISOLONE ACETATE 40 MG/ML INJ SUSP (RADIOLOG
120.0000 mg | Freq: Once | INTRAMUSCULAR | Status: DC
  Administered 2020-01-18 (×2): 120 mg via EPIDURAL

## 2020-01-18 NOTE — Discharge Instructions (Signed)

## 2020-01-19 MED ORDER — METHYLPREDNISOLONE ACETATE 40 MG/ML INJ SUSP (RADIOLOG
120.0000 mg | Freq: Once | INTRAMUSCULAR | Status: AC
  Administered 2020-01-18: 120 mg via EPIDURAL

## 2020-02-07 ENCOUNTER — Ambulatory Visit (HOSPITAL_COMMUNITY): Payer: Medicare Other | Attending: Cardiovascular Disease

## 2020-02-07 ENCOUNTER — Ambulatory Visit (INDEPENDENT_AMBULATORY_CARE_PROVIDER_SITE_OTHER)
Admission: RE | Admit: 2020-02-07 | Discharge: 2020-02-07 | Disposition: A | Discharge status: Home / Self Care | Payer: Self-pay | Source: Ambulatory Visit | Attending: Cardiovascular Disease | Admitting: Cardiovascular Disease

## 2020-02-07 ENCOUNTER — Other Ambulatory Visit: Payer: Self-pay

## 2020-02-07 DIAGNOSIS — R0609 Other forms of dyspnea: Secondary | ICD-10-CM

## 2020-02-07 DIAGNOSIS — R0600 Dyspnea, unspecified: Secondary | ICD-10-CM | POA: Diagnosis not present

## 2020-02-07 LAB — ECHOCARDIOGRAM COMPLETE
Area-P 1/2: 3.6 cm2
S' Lateral: 2.9 cm

## 2020-02-09 DIAGNOSIS — Z1231 Encounter for screening mammogram for malignant neoplasm of breast: Secondary | ICD-10-CM | POA: Diagnosis not present

## 2020-02-15 DIAGNOSIS — Z6836 Body mass index (BMI) 36.0-36.9, adult: Secondary | ICD-10-CM | POA: Diagnosis not present

## 2020-02-21 DIAGNOSIS — R001 Bradycardia, unspecified: Secondary | ICD-10-CM | POA: Diagnosis not present

## 2020-02-27 ENCOUNTER — Encounter: Payer: Self-pay | Admitting: Cardiovascular Disease

## 2020-02-27 ENCOUNTER — Ambulatory Visit (INDEPENDENT_AMBULATORY_CARE_PROVIDER_SITE_OTHER): Payer: Medicare Other | Admitting: Cardiovascular Disease

## 2020-02-27 ENCOUNTER — Other Ambulatory Visit: Payer: Self-pay

## 2020-02-27 DIAGNOSIS — R001 Bradycardia, unspecified: Secondary | ICD-10-CM

## 2020-02-27 DIAGNOSIS — R0609 Other forms of dyspnea: Secondary | ICD-10-CM

## 2020-02-27 DIAGNOSIS — R0600 Dyspnea, unspecified: Secondary | ICD-10-CM | POA: Diagnosis not present

## 2020-02-27 DIAGNOSIS — I10 Essential (primary) hypertension: Secondary | ICD-10-CM | POA: Diagnosis not present

## 2020-02-27 NOTE — Progress Notes (Signed)
02/27/2020 Melissa Maldonado   09-06-1940  [id number]  Primary Physician [name], MD Primary Cardiologist: Lorretta Harp MD Lupe Carney, [place]  HPI:  Melissa Maldonado is a 79 y.o.  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 Shelia Media, her PCP, for evaluation of progressive dyspnea on exertion.  I last saw her in the office 01/12/2020. I apparently saw her many years ago and did a MET test which was abnormal.  She does have treated hypertension.  There is no family history of heart disease.  She is never had a heart attack or stroke.  She denies chest pain.  She is noticed increasing dyspnea on exertion for last 6 months especially walking up stairs although she does also admit to having gained some weight as well.   Since I saw her a month and a half ago she did have a 2D echo performed 02/07/2020 that showed normal LV systolic function with grade 2 diastolic dysfunction and mild pulmonary hypertension.  A coronary calcium score was 0 suggesting no evidence of CAD.  She denies chest pain.  Current Meds  Medication Sig   ACETAMINOPHEN 8 HOUR PO Take 1 tablet by mouth.   cholecalciferol (VITAMIN D3) 25 MCG (1000 UNIT) tablet Take 1,000 Units by mouth daily.   Fluocinolone Acetonide 0.01 % OIL SMARTSIG:2 Drop(s) In Ear(s) Every Night PRN   leflunomide (ARAVA) 20 MG tablet Take 20 mg by mouth daily.   rOPINIRole (REQUIP) 0.5 MG tablet Take 0.5 mg by mouth at bedtime.   telmisartan-hydrochlorothiazide (MICARDIS HCT) 40-12.5 MG tablet Take 1 tablet by mouth daily.     Allergies  Allergen Reactions   Aspirin Other (See Comments)    "knocked out"   Codeine Nausea Only    Social History   Socioeconomic History   Marital status: Married    Spouse name: Not on file   Number of children: Not on file   Years of  education: Not on file   Highest education level: Not on file  Occupational History   Not on file  Tobacco Use   Smoking status: Never Smoker   Smokeless tobacco: Never Used  Substance and Sexual Activity   Alcohol use: Yes    Comment: socially wine   Drug use: No   Sexual activity: Yes    Birth control/protection: Post-menopausal  Other Topics Concern   Not on file  Social History Narrative   Not on file   Social Determinants of Health   Financial Resource Strain:    Difficulty of Paying Living Expenses:   Food Insecurity:    Worried About Charity fundraiser in the Last Year:    Arboriculturist in the Last Year:   Transportation Needs:    Film/video editor (Medical):    Lack of Transportation (Non-Medical):   Physical Activity:    Days of Exercise per Week:    Minutes of Exercise per Session:   Stress:    Feeling of Stress :   Social Connections:    Frequency of Communication with Friends and Family:    Frequency of Social Gatherings with Friends and Family:    Attends Religious Services:    Active Member of Clubs or Organizations:    Attends Archivist Meetings:    Marital Status:   Intimate Production manager  Violence:    Fear of Current or Ex-Partner:    Emotionally Abused:    Physically Abused:    Sexually Abused:      Review of Systems: General: negative for chills, fever, night sweats or weight changes.  Cardiovascular: negative for chest pain, dyspnea on exertion, edema, orthopnea, palpitations, paroxysmal nocturnal dyspnea or shortness of breath Dermatological: negative for rash Respiratory: negative for cough or wheezing Urologic: negative for hematuria Abdominal: negative for nausea, vomiting, diarrhea, bright red blood per rectum, melena, or hematemesis Neurologic: negative for visual changes, syncope, or dizziness All other systems reviewed and are otherwise negative except as noted above.    Blood pressure (!)  170/70, pulse (!) 40, height 5' 5"  (1.651 m), weight 210 lb 12.8 oz (95.6 kg), SpO2 98 %.  General appearance: alert Neck: no adenopathy, no carotid bruit, no JVD, supple, symmetrical, trachea midline and thyroid not enlarged, symmetric, no tenderness/mass/nodules Lungs: clear to auscultation bilaterally Heart: regular rate and rhythm, S1, S2 normal, no murmur, click, rub or gallop Extremities: extremities normal, atraumatic, no cyanosis or edema Pulses: 2+ and symmetric Skin: Skin color, texture, turgor normal. No rashes or lesions Neurologic: Alert and oriented X 3, normal strength and tone. Normal symmetric reflexes. Normal coordination and gait  EKG not performed today  ASSESSMENT AND PLAN:   Essential hypertension History of essential hypertension blood pressure measured today 170/70.  She is on Micardis/hydrochlorothiazide.  Sinus bradycardia History of sinus bradycardia with recent Zio patch performed 01/18/2020 for 6 days revealing short episodes of PSVT, sinus bradycardia and pauses as long as 3.3 seconds mostly during the morning hours while asleep which she is completely unaware of.  She denies palpitations.  I do not think these need to be further treated.  Dyspnea on exertion History of dyspnea on exertion especially when walking upstairs for the last 6 months.  2D echo revealed normal LV systolic function with grade 2 diastolic dysfunction and mild pulmonary hypertension.  Coronary calcium score was 0 suggesting no evidence of CAD.  She does admit gaining some weight over the last 6 to 12 months.      Lorretta Harp MD FACP,FACC,FAHA, [facility] 02/27/2020 10:15 AM

## 2020-02-27 NOTE — Assessment & Plan Note (Signed)
History of dyspnea on exertion especially when walking upstairs for the last 6 months.  2D echo revealed normal LV systolic function with grade 2 diastolic dysfunction and mild pulmonary hypertension.  Coronary calcium score was 0 suggesting no evidence of CAD.  She does admit gaining some weight over the last 6 to 12 months.

## 2020-02-27 NOTE — Assessment & Plan Note (Signed)
History of essential hypertension blood pressure measured today 170/70.  She is on Micardis/hydrochlorothiazide.

## 2020-02-27 NOTE — Assessment & Plan Note (Signed)
History of sinus bradycardia with recent Zio patch performed 01/18/2020 for 6 days revealing short episodes of PSVT, sinus bradycardia and pauses as long as 3.3 seconds mostly during the morning hours while asleep which she is completely unaware of.  She denies palpitations.  I do not think these need to be further treated.

## 2020-02-27 NOTE — Patient Instructions (Signed)

## 2020-03-01 DIAGNOSIS — M545 Low back pain: Secondary | ICD-10-CM | POA: Diagnosis not present

## 2020-03-01 DIAGNOSIS — I83812 Varicose veins of left lower extremities with pain: Secondary | ICD-10-CM | POA: Diagnosis not present

## 2020-03-01 DIAGNOSIS — G8929 Other chronic pain: Secondary | ICD-10-CM | POA: Diagnosis not present

## 2020-03-01 DIAGNOSIS — N1832 Chronic kidney disease, stage 3b: Secondary | ICD-10-CM | POA: Diagnosis not present

## 2020-03-06 DIAGNOSIS — N189 Chronic kidney disease, unspecified: Secondary | ICD-10-CM | POA: Diagnosis not present

## 2020-03-21 DIAGNOSIS — H18513 Endothelial corneal dystrophy, bilateral: Secondary | ICD-10-CM | POA: Diagnosis not present

## 2020-03-21 DIAGNOSIS — H401221 Low-tension glaucoma, left eye, mild stage: Secondary | ICD-10-CM | POA: Diagnosis not present

## 2020-03-21 DIAGNOSIS — H40011 Open angle with borderline findings, low risk, right eye: Secondary | ICD-10-CM | POA: Diagnosis not present

## 2020-03-21 DIAGNOSIS — H35033 Hypertensive retinopathy, bilateral: Secondary | ICD-10-CM | POA: Diagnosis not present

## 2020-04-02 DIAGNOSIS — M5441 Lumbago with sciatica, right side: Secondary | ICD-10-CM | POA: Diagnosis not present

## 2020-04-08 ENCOUNTER — Other Ambulatory Visit: Payer: Self-pay

## 2020-04-08 ENCOUNTER — Encounter: Payer: Self-pay | Admitting: Family Medicine

## 2020-04-08 ENCOUNTER — Ambulatory Visit (INDEPENDENT_AMBULATORY_CARE_PROVIDER_SITE_OTHER): Payer: Medicare Other | Admitting: Family Medicine

## 2020-04-08 VITALS — BP 132/64 | HR 43 | Ht 65.0 in | Wt 203.6 lb

## 2020-04-08 DIAGNOSIS — M5416 Radiculopathy, lumbar region: Secondary | ICD-10-CM

## 2020-04-08 DIAGNOSIS — R202 Paresthesia of skin: Secondary | ICD-10-CM

## 2020-04-08 DIAGNOSIS — M48062 Spinal stenosis, lumbar region with neurogenic claudication: Secondary | ICD-10-CM

## 2020-04-08 NOTE — Patient Instructions (Signed)
Thank you for coming in today. Plan for CT myelogram to look at the back better.   You should hear from CT scheduling within 1 week. If you do not hear please let me know.   Recheck with me after the CT scan.

## 2020-04-08 NOTE — Progress Notes (Signed)
I, [name], LAT, ATC, am serving as scribe for Dr. [name].  Melissa Maldonado is a 79 y.o. female who presents to [place] at [facility] today for f/u of chronic LBP and R LE radicular pain.  She was last seen by [name] on 01/15/20 and was referred for a lumbar epidural that she had on 01/18/20 (R L5 nerve root block and epidural).  She has a hx of 2 prior spine surgeries, one in 2010 and another in 2013.  Since then, pt reports con't LBP and R LE pain that is unchanged.  She also notes new numbness in her B great toes x a couple months.  She states that the epidural she had on 01/18/20 did not change her symptoms.  She is also reporting B knee pain.  She notes her leg pain did improve a bit with epidural steroid injection but her right low back pain is still pretty severe.  Diagnostic imaging: L-spine CT- 09/15/17  Pertinent review of systems: No fever or chills  Relevant historical information: 2 prior lumbar laminectomy and fusion   Exam:  BP 132/64 (BP Location: Right Arm, Patient Position: Sitting, Cuff Size: Large)   Pulse (!) 43   Ht 5\' 5"  (1.651 m)   Wt 203 lb 9.6 oz (92.4 kg)   SpO2 96%   BMI 33.88 kg/m  General: Well Developed, well nourished, and in no acute distress.   MSK: L-spine normal-appearing nontender midline.  Tender palpation right lumbar paraspinal musculature. Decreased lumbar motion. Lower extremity strength is intact.    Lab and Radiology Results  X-ray images obtained during CT myelogram lumbar spine September 15, 2017 personally and independently interpreted today. 2 prior fusions well-appearing with intact surgical hardware. Specifically right L5-S1 facet joint looks degenerative. Agree with radiology read as provided below .   IMPRESSION: 1. L1-L5 solid arthrodesis. Improved canal and foraminal patency at L1-2 when compared to preoperative study 11/25/2016. 2. L5-S1 advanced facet arthropathy and moderate disc  degeneration. No canal or foraminal impingement at this level. 3. There is degenerated pseudoarticulation between the right L5 transverse process and sacrum with spurring impinging on the right L5 nerve. 4. No interval adjacent segment degeneration at T12-L1.   Electronically Signed   By: Monte Fantasia M.D.   On: 09/15/2017 12:47  Assessment and Plan: 79 y.o. female with right low back pain with some paresthesias and radicular pain.  Radicular pain right leg did improve with epidural steroid injection in early July.  She does continue to have paresthesias some radicular pain but majority of pain is axial right lower back pain.  This is very likely to be facet agenic pain based on prior CT myelogram 2019.  Plan to repeat CT myelogram now.  This would give [place] information about current state of bony fusion and facet joints as well as potential radicular pain distally.  Recheck following CT myelogram.   PDMP not reviewed this encounter. Orders Placed This Encounter  Procedures  . CT LUMBAR SPINE WO CONTRAST    Standing Status:   Future    Standing Expiration Date:   04/08/2021    Scheduling Instructions:     Schedule with Lspine myelogram for CT myelogram lumbar spine. Eval radicular pain    Order Specific Question:   Preferred imaging location?    Answer:   [facility]    Order Specific Question:   Radiology Contrast Protocol - do NOT remove file path    Answer:   \\  [email or web address]  . DG Myelogram Lumbar    Standing Status:   Future    Standing Expiration Date:   04/08/2021    Order Specific Question:   If indicated for the ordered procedure, I authorize the administration of contrast media per Radiology protocol    Answer:   Yes    Order Specific Question:   Reason for Exam (SYMPTOM  OR DIAGNOSIS REQUIRED)    Answer:   with CT myelogram lumbr spine    Order Specific Question:   Preferred Imaging Location?    Answer:   [facility]    Order Specific Question:   Radiology Contrast Protocol - do NOT remove file path    Answer:   [email or web address]   No orders of the defined types were placed in this encounter.    Discussed warning signs or symptoms. Please see discharge instructions. Patient expresses understanding.   The above documentation has been reviewed and is accurate and complete [name], M.D.  Total encounter time 30 minutes including face-to-face time with the patient and, reviewing past medical record, and charting on the date of service.

## 2020-04-09 ENCOUNTER — Telehealth: Payer: Self-pay | Admitting: Nurse Practitioner

## 2020-04-09 NOTE — Telephone Encounter (Signed)
Phone call to patient to verify medication list and allergies for myelogram procedure. Pt instructed to hold tramadol for 48hrs prior to myelogram appointment time. Pt verbalized understanding. Pre and post procedure instructions reviewed with pt. 

## 2020-04-11 ENCOUNTER — Other Ambulatory Visit: Payer: Medicare Other

## 2020-04-12 DIAGNOSIS — I10 Essential (primary) hypertension: Secondary | ICD-10-CM | POA: Diagnosis not present

## 2020-04-12 DIAGNOSIS — E559 Vitamin D deficiency, unspecified: Secondary | ICD-10-CM | POA: Diagnosis not present

## 2020-04-12 DIAGNOSIS — R7989 Other specified abnormal findings of blood chemistry: Secondary | ICD-10-CM | POA: Diagnosis not present

## 2020-04-16 ENCOUNTER — Other Ambulatory Visit: Payer: Medicare Other

## 2020-04-22 ENCOUNTER — Ambulatory Visit
Admission: RE | Admit: 2020-04-22 | Discharge: 2020-04-22 | Disposition: A | Discharge status: Home / Self Care | Payer: Medicare Other | Source: Ambulatory Visit | Attending: Family Medicine | Admitting: Family Medicine

## 2020-04-22 ENCOUNTER — Other Ambulatory Visit: Payer: Self-pay | Admitting: Family Medicine

## 2020-04-22 DIAGNOSIS — M48062 Spinal stenosis, lumbar region with neurogenic claudication: Secondary | ICD-10-CM

## 2020-04-22 DIAGNOSIS — R202 Paresthesia of skin: Secondary | ICD-10-CM

## 2020-04-22 DIAGNOSIS — M4326 Fusion of spine, lumbar region: Secondary | ICD-10-CM | POA: Diagnosis not present

## 2020-04-22 DIAGNOSIS — M5416 Radiculopathy, lumbar region: Secondary | ICD-10-CM

## 2020-04-22 DIAGNOSIS — M4316 Spondylolisthesis, lumbar region: Secondary | ICD-10-CM | POA: Diagnosis not present

## 2020-04-22 MED ORDER — IOPAMIDOL (ISOVUE-M 200) INJECTION 41%
20.0000 mL | Freq: Once | INTRAMUSCULAR | Status: AC
  Administered 2020-04-22: 20 mL via INTRATHECAL

## 2020-04-22 MED ORDER — DIAZEPAM 5 MG PO TABS
5.0000 mg | ORAL_TABLET | Freq: Once | ORAL | Status: AC
  Administered 2020-04-22: 5 mg via ORAL

## 2020-04-22 NOTE — Discharge Instructions (Signed)

## 2020-04-22 NOTE — Progress Notes (Signed)
CT myelogram shows solid fusion higher up in the spine. At the base of the spin there is spurring and arthritis at the base. It could be pinching the L5 nerve root.  Please schedule follow up with me soon to go over the results and look at the pictures in detail.

## 2020-04-24 ENCOUNTER — Ambulatory Visit: Payer: Self-pay

## 2020-04-24 ENCOUNTER — Other Ambulatory Visit: Payer: Self-pay

## 2020-04-24 ENCOUNTER — Ambulatory Visit (INDEPENDENT_AMBULATORY_CARE_PROVIDER_SITE_OTHER): Payer: Medicare Other | Admitting: Family Medicine

## 2020-04-24 ENCOUNTER — Encounter: Payer: Self-pay | Admitting: Family Medicine

## 2020-04-24 VITALS — BP 130/70 | HR 42 | Ht 65.0 in | Wt 202.0 lb

## 2020-04-24 DIAGNOSIS — M545 Low back pain, unspecified: Secondary | ICD-10-CM

## 2020-04-24 DIAGNOSIS — M25551 Pain in right hip: Secondary | ICD-10-CM

## 2020-04-24 DIAGNOSIS — M5416 Radiculopathy, lumbar region: Secondary | ICD-10-CM | POA: Diagnosis not present

## 2020-04-24 DIAGNOSIS — M48062 Spinal stenosis, lumbar region with neurogenic claudication: Secondary | ICD-10-CM

## 2020-04-24 DIAGNOSIS — M533 Sacrococcygeal disorders, not elsewhere classified: Secondary | ICD-10-CM | POA: Diagnosis not present

## 2020-04-24 DIAGNOSIS — M7061 Trochanteric bursitis, right hip: Secondary | ICD-10-CM | POA: Diagnosis not present

## 2020-04-24 NOTE — Progress Notes (Signed)
Rito Ehrlich, am serving as a Education administrator for Dr. [name].  Melissa Maldonado is a 79 y.o. female who presents to [facility] today for f/u of chronic LBP and R LE radicular pain and for review of her lumbar CT and myelogram.  She was last seen by [name] on 04/08/20 for f/u after a R L5 ESI that did not help her symptoms and was referred for additional diagnostic testing.  Since her last visit w/ [name], pt reports that the pain is the same as the last time she was here. Patient states that after the injection she had maybe a day of relief and then the pain came back.   Pain is located in the right low back into the right lateral hip.  She has occasional pain but mostly paresthesia in her feet bilaterally.  Diagnostic testing: L-spine CT and myelogram- 04/22/20  Pertinent review of systems: No fevers or chills  Relevant historical information: Hypertension   Exam:  BP 130/70 (BP Location: Left Arm, Patient Position: Sitting, Cuff Size: Normal)   Pulse (!) 42   Ht 5\' 5"  (1.651 m)   Wt 202 lb (91.6 kg)   SpO2 97%   BMI 33.61 kg/m  General: Well Developed, well nourished, and in no acute distress.   MSK: L-spine normal-appearing nontender midline.  Tender palpation right lumbar paraspinal musculature and right SI joint. Right hip normal-appearing tender palpation greater trochanter.  Normal hip motion.    Lab and Radiology Results No results found for this or any previous visit (from the past 72 hour(s)). CT LUMBAR SPINE W CONTRAST  Result Date: 04/22/2020 CLINICAL DATA:  Chronic right leg pain. No relief after right L5 nerve root block in July. Prior lumbar fusion. EXAM: LUMBAR MYELOGRAM CT LUMBAR MYELOGRAM FLUOROSCOPY TIME:  Radiation Exposure Index (as provided by the fluoroscopic device): 13.8 mGy Fluoroscopy Time:  38 seconds Number of Acquired Images:  15 PROCEDURE: After thorough discussion of risks and benefits of the procedure including  bleeding, infection, injury to nerves, blood vessels, adjacent structures as well as headache and CSF leak, written and oral informed consent was obtained. Consent was obtained by Dr. Fabiola Backer. Time out form was completed. Patient was positioned prone on the fluoroscopy table. Local anesthesia was provided with 1% lidocaine without epinephrine after prepped and draped in the usual sterile fashion. Puncture was performed at L4-L5 using a 3 1/2 inch 22-gauge spinal needle via right paramedian approach. Using a single pass through the dura, the needle was placed within the thecal sac, with return of clear CSF. 15 mL of Isovue M-200 was injected into the thecal sac, with normal opacification of the nerve roots and cauda equina consistent with free flow within the subarachnoid space. I personally performed the lumbar puncture and administered the intrathecal contrast. I also personally supervised acquisition of the myelogram images. TECHNIQUE: Contiguous axial images were obtained through the lumbar spine after the intrathecal infusion of contrast. Coronal and sagittal reconstructions were obtained of the axial image sets. COMPARISON:  CT lumbar spine dated September 15, 2017. CT lumbar myelogram dated Nov 25, 2016. FINDINGS: LUMBAR MYELOGRAM FINDINGS: Unchanged mild anterolisthesis at L4-L5. No dynamic instability. Prior L1-L5 PLIF with removal of the L3 pedicle screws. Small ventral extradural defect at L1-L2. No spinal canal stenosis or nerve root effacement. CT LUMBAR MYELOGRAM FINDINGS: Segmentation: Transitional lumbosacral anatomy again noted with partial sacralization of L5 on the right. Alignment: Unchanged fused 4 mm anterolisthesis  at L4-L5. Vertebrae: Similar appearing solid interbody and posterior fusion from L1-L5. Pedicle screws at L1-L2 and L4-L5 without hardware complication. Previously removed L3 pedicle screws. No acute fracture or other focal pathologic process. Conus medullaris and cauda equina:  Conus extends to the L1 level. Conus and cauda equina appear normal. Paraspinal and other soft tissues: Postsurgical changes and heterotopic ossification in the paraspinous soft tissues. Mild bilateral sacroiliac joint osteoarthritis. Disc levels: T11-T12: Partially visualized left-sided disc protrusion with effacement of the left ventral thecal sac, new since 2018. T12-L1: Negative disc. Unchanged mild bilateral facet arthropathy. No stenosis. L1-L2: Prior PLIF with solid interbody and posterior fusion. Residual posterior endplate ridging with mild bilateral neuroforaminal stenosis due to bony hypertrophy. No spinal canal stenosis. L2-L3:  Solid interbody and posterior fusion.  No residual stenosis. L3-L4: Prior posterior decompression with solid interbody and posterior fusion. No residual stenosis. L4-L5: Prior posterior decompression with solid interbody and posterior fusion. No residual stenosis. L5-S1: Unchanged disc bulging and for lateral endplate spurring. Unchanged severe bilateral facet arthropathy. No spinal canal or neuroforaminal stenosis. Right transverse process and sacrum pseudoarthrosis with spurring contributes to impingement of the extraforaminal right L5 nerve root (series 7, image 27), similar to prior study. IMPRESSION: 1. Prior L1-L5 solid arthrodesis with unchanged residual mild bilateral neuroforaminal stenosis at L1-L2. 2. Similar appearing right L5 transverse process and sacrum pseudoarthrosis with spurring impinging on the extraforaminal right L5 nerve root. 3. New partially visualized left-sided disc protrusion at T11-T12 with effacement of the left ventral thecal sac. Electronically Signed   By: Titus Dubin M.D.   On: 04/22/2020 12:47   DG MYELOGRAPHY LUMBAR INJ LUMBOSACRAL  Result Date: 04/22/2020 CLINICAL DATA:  Chronic right leg pain. No relief after right L5 nerve root block in July. Prior lumbar fusion. EXAM: LUMBAR MYELOGRAM CT LUMBAR MYELOGRAM FLUOROSCOPY TIME:  Radiation  Exposure Index (as provided by the fluoroscopic device): 13.8 mGy Fluoroscopy Time:  38 seconds Number of Acquired Images:  15 PROCEDURE: After thorough discussion of risks and benefits of the procedure including bleeding, infection, injury to nerves, blood vessels, adjacent structures as well as headache and CSF leak, written and oral informed consent was obtained. Consent was obtained by Dr. Fabiola Backer. Time out form was completed. Patient was positioned prone on the fluoroscopy table. Local anesthesia was provided with 1% lidocaine without epinephrine after prepped and draped in the usual sterile fashion. Puncture was performed at L4-L5 using a 3 1/2 inch 22-gauge spinal needle via right paramedian approach. Using a single pass through the dura, the needle was placed within the thecal sac, with return of clear CSF. 15 mL of Isovue M-200 was injected into the thecal sac, with normal opacification of the nerve roots and cauda equina consistent with free flow within the subarachnoid space. I personally performed the lumbar puncture and administered the intrathecal contrast. I also personally supervised acquisition of the myelogram images. TECHNIQUE: Contiguous axial images were obtained through the lumbar spine after the intrathecal infusion of contrast. Coronal and sagittal reconstructions were obtained of the axial image sets. COMPARISON:  CT lumbar spine dated September 15, 2017. CT lumbar myelogram dated Nov 25, 2016. FINDINGS: LUMBAR MYELOGRAM FINDINGS: Unchanged mild anterolisthesis at L4-L5. No dynamic instability. Prior L1-L5 PLIF with removal of the L3 pedicle screws. Small ventral extradural defect at L1-L2. No spinal canal stenosis or nerve root effacement. CT LUMBAR MYELOGRAM FINDINGS: Segmentation: Transitional lumbosacral anatomy again noted with partial sacralization of L5 on the right. Alignment: Unchanged fused 4 mm  anterolisthesis at L4-L5. Vertebrae: Similar appearing solid interbody and  posterior fusion from L1-L5. Pedicle screws at L1-L2 and L4-L5 without hardware complication. Previously removed L3 pedicle screws. No acute fracture or other focal pathologic process. Conus medullaris and cauda equina: Conus extends to the L1 level. Conus and cauda equina appear normal. Paraspinal and other soft tissues: Postsurgical changes and heterotopic ossification in the paraspinous soft tissues. Mild bilateral sacroiliac joint osteoarthritis. Disc levels: T11-T12: Partially visualized left-sided disc protrusion with effacement of the left ventral thecal sac, new since 2018. T12-L1: Negative disc. Unchanged mild bilateral facet arthropathy. No stenosis. L1-L2: Prior PLIF with solid interbody and posterior fusion. Residual posterior endplate ridging with mild bilateral neuroforaminal stenosis due to bony hypertrophy. No spinal canal stenosis. L2-L3:  Solid interbody and posterior fusion.  No residual stenosis. L3-L4: Prior posterior decompression with solid interbody and posterior fusion. No residual stenosis. L4-L5: Prior posterior decompression with solid interbody and posterior fusion. No residual stenosis. L5-S1: Unchanged disc bulging and for lateral endplate spurring. Unchanged severe bilateral facet arthropathy. No spinal canal or neuroforaminal stenosis. Right transverse process and sacrum pseudoarthrosis with spurring contributes to impingement of the extraforaminal right L5 nerve root (series 7, image 27), similar to prior study. IMPRESSION: 1. Prior L1-L5 solid arthrodesis with unchanged residual mild bilateral neuroforaminal stenosis at L1-L2. 2. Similar appearing right L5 transverse process and sacrum pseudoarthrosis with spurring impinging on the extraforaminal right L5 nerve root. 3. New partially visualized left-sided disc protrusion at T11-T12 with effacement of the left ventral thecal sac. Electronically Signed   By: Titus Dubin M.D.   On: 04/22/2020 12:47  I, [name], personally  (independently) visualized and performed the interpretation of the images attached in this note. Patient has considerable facet DJD L5-S1 right worse than left.  Intact fusions as above.  Procedure: Real-time Ultrasound Guided Injection of right SI joint Device: Philips Affiniti 50G Images permanently stored and available for review in PACS Verbal informed consent obtained.  Discussed risks and benefits of procedure. Warned about infection bleeding damage to structures skin hypopigmentation and fat atrophy among others. Patient expresses understanding and agreement Time-out conducted.   Noted no overlying erythema, induration, or other signs of local infection.   Skin prepped in a sterile fashion.   Local anesthesia: Topical Ethyl chloride.   With sterile technique and under real time ultrasound guidance:  40 mg of Kenalog and 2 L of Marcaine injected into SI joint. Fluid seen entering the SI joint.   Completed without difficulty   Pain partially resolved suggesting accurate placement of the medication.   Advised to call if fevers/chills, erythema, induration, drainage, or persistent bleeding.   Images permanently stored and available for review in the ultrasound unit.  Impression: Technically successful ultrasound guided injection.    Assessment and Plan: 79 y.o. female with right low back pain and lateral hip pain.  Multifactorial.  Believe pain to be due to facet joint as well as SI joint and likely greater trochanter bursitis.  Discussed options.  Plan for SI joint injection today.  Additionally will refer to physical therapy.  If not better would proceed with right L5-S1 facet injection if possible with radiology.  Check back as needed.   PDMP not reviewed this encounter. Orders Placed This Encounter  Procedures  . [place] LIMITED JOINT SPACE STRUCTURES LOW RIGHT(NO LINKED CHARGES)    Order Specific Question:   Reason for Exam (SYMPTOM  OR DIAGNOSIS REQUIRED)    Answer:   rt SI inj  Order Specific Question:   Preferred imaging location?    Answer:   [place]  . Ambulatory referral to Physical Therapy    Referral Priority:   Routine    Referral Type:   Physical Medicine    Referral Reason:   Specialty Services Required    Requested Specialty:   Physical Therapy   No orders of the defined types were placed in this encounter.    Discussed warning signs or symptoms. Please see discharge instructions. Patient expresses understanding.   The above documentation has been reviewed and is accurate and complete [name], M.D.

## 2020-04-24 NOTE — Patient Instructions (Signed)
Thank you for coming in today.  I've referred you to Physical Therapy.  Let us know if you don't hear from them in one week.  Call or go to the ER if you develop a large red swollen joint with extreme pain or oozing puss.   If not better with PT and injection today let me know.  I will arrange for a Facet injection with radiology.    Facet Joint Block The facet joints connect the bones of the spine (vertebrae). They make it possible for you to bend, twist, and make other movements with your spine. They also keep you from bending too far, twisting too far, and making other extreme movements. A facet joint block is a procedure in which a numbing medicine (anesthetic) is injected into a facet joint. In many cases, an anti-inflammatory medicine (steroid) is also injected. A facet joint block may be done:  To diagnose neck or back pain. If the pain gets better after a facet joint block, it means the pain is probably coming from the facet joint. If the pain does not get better, it means the pain is probably not coming from the facet joint.  To relieve neck or back pain that is caused by an inflamed facet joint. A facet joint block is only done to relieve pain if the pain does not improve with other methods, such as medicine, exercise programs, and physical therapy. Tell a health care provider about:  Any allergies you have.  All medicines you are taking, including vitamins, herbs, eye drops, creams, and over-the-counter medicines.  Any problems you or family members have had with anesthetic medicines.  Any blood disorders you have.  Any surgeries you have had.  Any medical conditions you have or have had.  Whether you are pregnant or may be pregnant. What are the risks? Generally, this is a safe procedure. However, problems may occur, including:  Bleeding.  Injury to a nerve near the injection site.  Pain at the injection site.  Weakness or numbness in areas controlled by nerves  near the injection site.  Infection.  Temporary fluid retention.  Allergic reactions to medicines or dyes.  Injury to other structures or organs near the injection site. What happens before the procedure? Medicines Ask your health care provider about:  Changing or stopping your regular medicines. This is especially important if you are taking diabetes medicines or blood thinners.  Taking medicines such as aspirin and ibuprofen. These medicines can thin your blood. Do not take these medicines unless your health care provider tells you to take them.  Taking over-the-counter medicines, vitamins, herbs, and supplements. Eating and drinking Follow instructions from your health care provider about eating and drinking, which may include:  8 hours before the procedure - stop eating heavy meals or foods, such as meat, fried foods, or fatty foods.  6 hours before the procedure - stop eating light meals or foods, such as toast or cereal.  6 hours before the procedure - stop drinking milk or drinks that contain milk.  2 hours before the procedure - stop drinking clear liquids. Staying hydrated Follow instructions from your health care provider about hydration, which may include:  Up to 2 hours before the procedure - you may continue to drink clear liquids, such as water, clear fruit juice, black coffee, and plain tea. General instructions  Do not use any products that contain nicotine or tobacco for at least 4-6 weeks before the procedure. These products include cigarettes, e-cigarettes, and  chewing tobacco. If you need help quitting, ask your health care provider.  Plan to have someone take you home from the hospital or clinic.  Ask your health care provider: ? How your surgery site will be marked. ? What steps will be taken to help prevent infection. These may include:  Removing hair at the surgery site.  Washing skin with a germ-killing soap.  Receiving antibiotic medicine. What  happens during the procedure?   You will put on a hospital gown.  You will lie on your stomach on an X-ray table. You may be asked to lie in a different position if an injection will be made in your neck.  Machines will be used to monitor your oxygen levels, heart rate, and blood pressure.  Your skin will be cleaned.  If an injection will be made in your neck, an IV will be inserted into one of your veins. Fluids and medicine will flow directly into your body through the IV.  A numbing medicine (local anesthetic) will be applied to your skin. Your skin may sting or burn for a moment.  A video X-ray machine (fluoroscopy) will be used to find the joint. In some cases, a CT scan may be used.  A contrast dye may be injected into the facet joint area to help find the joint.  When the joint is located, an anesthetic will be injected into the joint through the needle.  Your health care provider will ask you whether you feel pain relief. ? If you feel relief, a steroid may be injected to provide pain relief for a longer period of time. ? If you do not feel relief or feel only partial relief, additional injections of an anesthetic may be made in other facet joints.  The needle will be removed.  Your skin will be cleaned.  A bandage (dressing) will be applied over each injection site. The procedure may vary among health care providers and hospitals. What happens after the procedure?  Your blood pressure, heart rate, breathing rate, and blood oxygen level will be monitored until you leave the hospital or clinic.  You will lie down and rest for a period of time. Summary  A facet joint block is a procedure in which a numbing medicine (anesthetic) is injected into a facet joint. An anti-inflammatory medicine (stereoid) may also be injected.  Follow instructions from your health care provider about medicines and eating and drinking before the procedure.  Do not use any products that  contain nicotine or tobacco for at least 4-6 weeks before the procedure.  You will lie on your stomach for the procedure, but you may be asked to lie in a different position if an injection will be made in your neck.  When the joint is located, an anesthetic will be injected into the joint through the needle. This information is not intended to replace advice given to you by your health care provider. Make sure you discuss any questions you have with your health care provider. Document Revised: 10/27/2018 Document Reviewed: 06/10/2018 Elsevier Patient Education  [place].

## 2020-04-26 DIAGNOSIS — E559 Vitamin D deficiency, unspecified: Secondary | ICD-10-CM | POA: Diagnosis not present

## 2020-04-26 DIAGNOSIS — Z01419 Encounter for gynecological examination (general) (routine) without abnormal findings: Secondary | ICD-10-CM | POA: Diagnosis not present

## 2020-04-26 DIAGNOSIS — Z1212 Encounter for screening for malignant neoplasm of rectum: Secondary | ICD-10-CM | POA: Diagnosis not present

## 2020-04-26 DIAGNOSIS — I10 Essential (primary) hypertension: Secondary | ICD-10-CM | POA: Diagnosis not present

## 2020-04-26 DIAGNOSIS — Z23 Encounter for immunization: Secondary | ICD-10-CM | POA: Diagnosis not present

## 2020-04-26 DIAGNOSIS — Z0000 Encounter for general adult medical examination without abnormal findings: Secondary | ICD-10-CM | POA: Diagnosis not present

## 2020-05-07 DIAGNOSIS — Z23 Encounter for immunization: Secondary | ICD-10-CM | POA: Diagnosis not present

## 2020-05-13 ENCOUNTER — Ambulatory Visit (INDEPENDENT_AMBULATORY_CARE_PROVIDER_SITE_OTHER): Payer: Medicare Other | Admitting: Family Medicine

## 2020-05-13 ENCOUNTER — Encounter: Payer: Self-pay | Admitting: Family Medicine

## 2020-05-13 ENCOUNTER — Other Ambulatory Visit: Payer: Self-pay

## 2020-05-13 ENCOUNTER — Ambulatory Visit: Payer: Self-pay

## 2020-05-13 VITALS — BP 128/70 | HR 48 | Ht 65.0 in | Wt 196.0 lb

## 2020-05-13 DIAGNOSIS — M25561 Pain in right knee: Secondary | ICD-10-CM

## 2020-05-13 NOTE — Progress Notes (Signed)
Melissa Maldonado is a 79 y.o. female who presents to [place] at [facility] today for R knee pain.  She was last seen by [name] on 04/24/20 for f/u of chronic LBP and R LE radicular pain and had a R SIJ injection.  Since her last visit, pt reports for R knee pain w/ pain radiating into her R lower leg and into her foot.  She also notes B great toe pain.  She reports also having gotten a R calf cramp last night which is causing her R calf to be sore.   Pertinent review of systems: No fevers or chills  Relevant historical information: Hypertension   Exam:  BP 128/70 (BP Location: Left Arm, Patient Position: Sitting, Cuff Size: Large)   Pulse (!) 48   Ht 5\' 5"  (1.651 m)   Wt 196 lb (88.9 kg)   SpO2 97%   BMI 32.62 kg/m  General: Well Developed, well nourished, and in no acute distress.   MSK: Right knee mild effusion.  Normal-appearing otherwise. Normal motion with crepitation. Tender palpation medial joint line. No masses palpated. L-spine normal-appearing normal leg motion.  Lower extremity strength is intact.  Negative slump test bilaterally.    Lab and Radiology Results  Procedure: Real-time Ultrasound Guided Injection of right knee superior lateral patellar space Device: Philips Affiniti 50G Images permanently stored and available for review in PACS Inspection of knee prior to examination reveals small joint effusion.  Medial compartment DJD with extruded medial meniscus.  No Baker's cyst. Verbal informed consent obtained.  Discussed risks and benefits of procedure. Warned about infection bleeding damage to structures skin hypopigmentation and fat atrophy among others. Patient expresses understanding and agreement Time-out conducted.   Noted no overlying erythema, induration, or other signs of local infection.   Skin prepped in a sterile fashion.   Local anesthesia: Topical Ethyl chloride.   With sterile technique and under real time ultrasound  guidance:  40 mg of Kenalog and 2 mL of Marcaine injected into joint capsule. Fluid seen entering the joint.   Completed without difficulty   Pain immediately resolved suggesting accurate placement of the medication.   Advised to call if fevers/chills, erythema, induration, drainage, or persistent bleeding.   Images permanently stored and available for review in the ultrasound unit.  Impression: Technically successful ultrasound guided injection.        EXAM: LUMBAR MYELOGRAM  CT LUMBAR MYELOGRAM  FLUOROSCOPY TIME:  Radiation Exposure Index (as provided by the fluoroscopic device): 13.8 mGy  Fluoroscopy Time:  38 seconds  Number of Acquired Images:  15  PROCEDURE: After thorough discussion of risks and benefits of the procedure including bleeding, infection, injury to nerves, blood vessels, adjacent structures as well as headache and CSF leak, written and oral informed consent was obtained. Consent was obtained by Dr. Fabiola Backer. Time out form was completed.  Patient was positioned prone on the fluoroscopy table. Local anesthesia was provided with 1% lidocaine without epinephrine after prepped and draped in the usual sterile fashion. Puncture was performed at L4-L5 using a 3 1/2 inch 22-gauge spinal needle via right paramedian approach. Using a single pass through the dura, the needle was placed within the thecal sac, with return of clear CSF. 15 mL of Isovue M-200 was injected into the thecal sac, with normal opacification of the nerve roots and cauda equina consistent with free flow within the subarachnoid space.  I personally performed the lumbar puncture and administered the intrathecal contrast.  I also personally supervised acquisition of the myelogram images.  TECHNIQUE: Contiguous axial images were obtained through the lumbar spine after the intrathecal infusion of contrast. Coronal and sagittal reconstructions were obtained of the axial image  sets.  COMPARISON:  CT lumbar spine dated September 15, 2017. CT lumbar myelogram dated Nov 25, 2016.  FINDINGS: LUMBAR MYELOGRAM FINDINGS:  Unchanged mild anterolisthesis at L4-L5. No dynamic instability. Prior L1-L5 PLIF with removal of the L3 pedicle screws. Small ventral extradural defect at L1-L2. No spinal canal stenosis or nerve root effacement.  CT LUMBAR MYELOGRAM FINDINGS:  Segmentation: Transitional lumbosacral anatomy again noted with partial sacralization of L5 on the right.  Alignment: Unchanged fused 4 mm anterolisthesis at L4-L5.  Vertebrae: Similar appearing solid interbody and posterior fusion from L1-L5. Pedicle screws at L1-L2 and L4-L5 without hardware complication. Previously removed L3 pedicle screws. No acute fracture or other focal pathologic process.  Conus medullaris and cauda equina: Conus extends to the L1 level. Conus and cauda equina appear normal.  Paraspinal and other soft tissues: Postsurgical changes and heterotopic ossification in the paraspinous soft tissues. Mild bilateral sacroiliac joint osteoarthritis.  Disc levels:  T11-T12: Partially visualized left-sided disc protrusion with effacement of the left ventral thecal sac, new since 2018.  T12-L1: Negative disc. Unchanged mild bilateral facet arthropathy. No stenosis.  L1-L2: Prior PLIF with solid interbody and posterior fusion. Residual posterior endplate ridging with mild bilateral neuroforaminal stenosis due to bony hypertrophy. No spinal canal stenosis.  L2-L3:  Solid interbody and posterior fusion.  No residual stenosis.  L3-L4: Prior posterior decompression with solid interbody and posterior fusion. No residual stenosis.  L4-L5: Prior posterior decompression with solid interbody and posterior fusion. No residual stenosis.  L5-S1: Unchanged disc bulging and for lateral endplate spurring. Unchanged severe bilateral facet arthropathy. No spinal canal  or neuroforaminal stenosis. Right transverse process and sacrum pseudoarthrosis with spurring contributes to impingement of the extraforaminal right L5 nerve root (series 7, image 27), similar to prior study.  IMPRESSION: 1. Prior L1-L5 solid arthrodesis with unchanged residual mild bilateral neuroforaminal stenosis at L1-L2. 2. Similar appearing right L5 transverse process and sacrum pseudoarthrosis with spurring impinging on the extraforaminal right L5 nerve root. 3. New partially visualized left-sided disc protrusion at T11-T12 with effacement of the left ventral thecal sac.   Electronically Signed   By: Titus Dubin M.D.   On: 04/22/2020 12:47 I, [name], personally (independently) visualized and performed the interpretation of the images attached in this note.     Assessment and Plan: 79 y.o. female with left knee pain thought to be due to DJD knee.  Addition patient has evidence of meniscus tear medially.  Plan for injection knee as above.  Also recommend Voltaren gel and advancing activity as tolerated.  Some of this pain however could also be multifactorial due to lumbosacral radiculopathy or even calf spasm due to restless leg syndrome.    Recheck in about 4 weeks.  Will reassess at that point.  Could also proceed with epidural steroid injection   PDMP not reviewed this encounter. Orders Placed This Encounter  Procedures  . [place] LIMITED JOINT SPACE STRUCTURES LOW RIGHT(NO LINKED CHARGES)    Order Specific Question:   Reason for Exam (SYMPTOM  OR DIAGNOSIS REQUIRED)    Answer:   R knee pain    Order Specific Question:   Preferred imaging location?    Answer:   [facility]   No orders of the defined types were placed in  this encounter.    Discussed warning signs or symptoms. Please see discharge instructions. Patient expresses understanding.   The above documentation has been reviewed and is accurate and complete [name],  M.D.

## 2020-05-13 NOTE — Patient Instructions (Addendum)
Thank you for coming in today.  [phone] - Please call [facility] to schedule PT.  You had a R knee injection today.  Call or go to the ER if you develop a large red swollen joint with extreme pain or oozing puss.   Please schedule a follow-up appt in 6 weeks.

## 2020-05-23 DIAGNOSIS — H40011 Open angle with borderline findings, low risk, right eye: Secondary | ICD-10-CM | POA: Diagnosis not present

## 2020-05-23 DIAGNOSIS — H18513 Endothelial corneal dystrophy, bilateral: Secondary | ICD-10-CM | POA: Diagnosis not present

## 2020-05-23 DIAGNOSIS — H401220 Low-tension glaucoma, left eye, stage unspecified: Secondary | ICD-10-CM | POA: Diagnosis not present

## 2020-05-31 ENCOUNTER — Other Ambulatory Visit: Payer: Self-pay

## 2020-05-31 ENCOUNTER — Ambulatory Visit (INDEPENDENT_AMBULATORY_CARE_PROVIDER_SITE_OTHER): Payer: Medicare Other | Admitting: Physical Therapy

## 2020-05-31 DIAGNOSIS — M5441 Lumbago with sciatica, right side: Secondary | ICD-10-CM

## 2020-05-31 DIAGNOSIS — M25561 Pain in right knee: Secondary | ICD-10-CM

## 2020-05-31 DIAGNOSIS — G8929 Other chronic pain: Secondary | ICD-10-CM | POA: Diagnosis not present

## 2020-05-31 DIAGNOSIS — M25551 Pain in right hip: Secondary | ICD-10-CM

## 2020-06-03 ENCOUNTER — Other Ambulatory Visit: Payer: Self-pay

## 2020-06-03 ENCOUNTER — Encounter: Payer: Self-pay | Admitting: Physical Therapy

## 2020-06-03 ENCOUNTER — Ambulatory Visit (INDEPENDENT_AMBULATORY_CARE_PROVIDER_SITE_OTHER): Payer: Medicare Other | Admitting: Physical Therapy

## 2020-06-03 DIAGNOSIS — M5441 Lumbago with sciatica, right side: Secondary | ICD-10-CM | POA: Diagnosis not present

## 2020-06-03 DIAGNOSIS — M25561 Pain in right knee: Secondary | ICD-10-CM | POA: Diagnosis not present

## 2020-06-03 DIAGNOSIS — M25551 Pain in right hip: Secondary | ICD-10-CM

## 2020-06-03 DIAGNOSIS — G8929 Other chronic pain: Secondary | ICD-10-CM

## 2020-06-03 NOTE — Therapy (Signed)
[facility] [address] [place], [place], [address] Phone: [phone]   Fax:  [phone]  Physical Therapy Treatment  Patient Details  Name: Melissa Maldonado MRN: [id number] Date of Birth: 11/23/1940 Referring Provider (PT): [name]   Encounter Date: 06/03/2020   PT End of Session - 06/03/20 1050    Visit Number 2    Number of Visits 16    Date for PT Re-Evaluation 07/26/20    Authorization Type Medicare    Authorization Time Period 8 weeks    PT Start Time 0930    PT Stop Time 1016    PT Time Calculation (min) 46 min    Activity Tolerance Patient tolerated treatment well    Behavior During Therapy [facility] for tasks assessed/performed           Past Medical History:  Diagnosis Date  . Arthritis   . Chronic back pain    lumbar stenosis  . Early cataracts, bilateral   . GERD (gastroesophageal reflux disease)    occasionally but no meds required  . Headache(784.0)    occasionally  . History of blood transfusion   . History of colon polyps   . Hyperlipidemia    takes Lipitor every other day  . Hypertension    takes losartan daily  . Osteoarthritis of left knee 04/27/2014  . Shortness of breath    with exertion  . SOB (shortness of breath) 2013   CPET  . Tear of medial meniscus of left knee 09/08/2013    Past Surgical History:  Procedure Laterality Date  . APPENDECTOMY    . BACK SURGERY  2010   lumb fusion  . COLONOSCOPY    . EYE SURGERY     both cataracts  . KNEE ARTHROSCOPY WITH MEDIAL MENISECTOMY Left 09/08/2013   Procedure: LEFT KNEE ARTHROSCOPY WITH PARTIAL MEDIAL MENISCECTOMY;  Surgeon: [name], MD;  Location: [facility];  Service: Orthopedics;  Laterality: Left;  . LUMBAR LAMINECTOMY/DECOMPRESSION MICRODISCECTOMY  05/13/2012   Procedure: LUMBAR LAMINECTOMY/DECOMPRESSION MICRODISCECTOMY 1 LEVEL;  Surgeon: [name], MD;  Location: [facility];  Service: Neurosurgery;  Laterality:  Bilateral;  Bilateral Lumbar one -two Decompressive Laminectomy  . PARTIAL KNEE ARTHROPLASTY Left 04/27/2014   Procedure: LEFT UNICOMPARTMENTAL KNEE;  Surgeon: [name], MD;  Location: [facility];  Service: Orthopedics;  Laterality: Left;  . UPPER GI ENDOSCOPY      There were no vitals filed for this visit.   Subjective Assessment - 06/03/20 1050    Subjective Pt states continued pain. Has been doing HEP    Currently in Pain? Yes    Pain Score 8     Pain Location Back    Pain Orientation Right    Pain Descriptors / Indicators Aching    Pain Type Acute pain    Pain Score 7    Pain Location Knee    Pain Orientation Right    Pain Descriptors / Indicators Aching    Pain Type Chronic pain    Pain Onset More than a month ago    Pain Frequency Intermittent                             [facility] Adult PT Treatment/Exercise - 06/03/20 1052      Ambulation/Gait   Gait Comments antalgic gait/ R knee       Exercises   Exercises Knee/Hip  Knee/Hip Exercises: Stretches   Other Knee/Hip Stretches SKTC, piriformis supine fig 4  ea 30 sec x 3 bil;        Knee/Hip Exercises: Aerobic   Recumbent Bike L1 x 6 min       Knee/Hip Exercises: Seated   Long Arc Quad 20 reps;Both    Long Arc Quad Limitations 2.5 lb      Knee/Hip Exercises: Supine   Other Supine Knee/Hip Exercises Clam, Supine march both with GTB x 20, with TA    Other Supine Knee/Hip Exercises TA with breathing x15 with education on correct performance       Knee/Hip Exercises: Sidelying   Hip ABduction Both;15 reps      Manual Therapy   Manual Therapy Joint mobilization;Soft tissue mobilization;Passive ROM;Manual Traction    Joint Mobilization R hip, inf mobs gr 3 ;     Soft tissue mobilization STM/IASTM to R lumbar, SI.     Manual Traction Long leg distraction R, for lumbar pump x3 min                     PT Short Term Goals - 06/03/20 1029      PT SHORT TERM GOAL #1    Title Pt to be independent with initial HEP    Time 2    Period Weeks    Status New    Target Date 06/14/20             PT Long Term Goals - 06/03/20 1031      PT LONG TERM GOAL #1   Title Pt to be independent with final HEP    Time 8    Period Weeks    Status New    Target Date 07/26/20      PT LONG TERM GOAL #2   Title Pt to report decreased pain in lumbar spine and LE to 0-2/10 in am, and with activity at end of the day    Time 8    Period Weeks    Status New    Target Date 07/26/20      PT LONG TERM GOAL #3   Title Pt to report decreased pain in R knee, to 0-3/10 with standing, walking, and IADLS.    Time 8    Period Weeks    Status New    Target Date 07/26/20      PT LONG TERM GOAL #4   Title Pt to demo improved strength of hips and knees to at least 4+/5 to improve stability , ambulation and pain,    Time 8    Period Weeks    Status New    Target Date 07/26/20                 Plan - 06/03/20 1051    Clinical Impression Statement Pt with tightness and soreness in R lumbar region with STM today. Ther ex progressed for hip strengthening, plan to progress as tolerated.    Personal Factors and Comorbidities Comorbidity 1;Time since onset of injury/illness/exacerbation    Comorbidities several previous back surgeries and injections in back and knee, Knee OA.    Examination-Activity Limitations Stand;Locomotion Level;Stairs    Examination-Participation Restrictions Cleaning;Meal Prep;Yard Work;Community Activity;Laundry;Shop    Stability/Clinical Decision Making Evolving/Moderate complexity    Rehab Potential Good    PT Frequency 2x / week    PT Duration 6 weeks    PT Treatment/Interventions ADLs/Self Care Home Management;Cryotherapy;Electrical Stimulation;Iontophoresis 4mg /ml Dexamethasone;Moist Heat;Traction;Ultrasound;Therapeutic exercise;Therapeutic activities;Functional  mobility training;Stair training;Gait training;Balance training;Neuromuscular  re-education;Patient/family education;Manual techniques;Vasopneumatic Device;Taping;Dry needling;Passive range of motion;Visual/perceptual remediation/compensation;Spinal Manipulations;Joint Manipulations;DME Instruction    Consulted and Agree with Plan of Care Patient           Patient will benefit from skilled therapeutic intervention in order to improve the following deficits and impairments:  Abnormal gait, Pain, Increased muscle spasms, Decreased scar mobility, Decreased mobility, Decreased activity tolerance, Decreased endurance, Decreased range of motion, Decreased strength, Difficulty walking, Decreased balance  Visit Diagnosis: Chronic bilateral low back pain with right-sided sciatica  Pain in right hip  Chronic pain of right knee     Problem List Patient Active Problem List   Diagnosis Date Noted  . SI (sacroiliac) pain 04/24/2020  . Trochanteric bursitis, right hip 04/24/2020  . Essential hypertension 01/12/2020  . Sinus bradycardia 01/12/2020  . Dyspnea on exertion 01/12/2020  . Lumbar stenosis with neurogenic claudication 02/01/2017  . Osteoarthritis of left knee 04/27/2014  . Tear of medial meniscus of left knee 09/08/2013    Lyndee Hensen, PT, DPT 10:54 AM  06/03/20    [facility] [place], [place], [address] Phone: [phone]   Fax:  [phone]  Name: Melissa Maldonado MRN: [id number] Date of Birth: 03-17-1941

## 2020-06-03 NOTE — Patient Instructions (Signed)
Access Code: [email or web address] URL: [email or web address] Prepared by: Lyndee Hensen  Exercises Supine Single Knee to Chest Stretch - 2 x daily - 3 reps - 30 hold Supine Piriformis Stretch Pulling Heel to Hip - 2 x daily - 3 reps - 30 hold Hooklying Clamshell with Resistance - 1 x daily - 2 sets - 10 reps Sidelying Hip Abduction - 1 x daily - 2 sets - 10 reps

## 2020-06-03 NOTE — Therapy (Signed)
[facility] [address] [place], [place], [address] Phone: [phone]   Fax:  [phone]  Physical Therapy Evaluation  Patient Details  Name: Melissa Maldonado MRN: [id number] Date of Birth: [birth year] Referring Provider (PT): [name]   Encounter Date: 05/31/2020   PT End of Session - 06/03/20 1001    Visit Number 1    Number of Visits 16    Date for PT Re-Evaluation 07/26/20    Authorization Type Medicare    Authorization Time Period 8 weeks    PT Start Time 1100    PT Stop Time 1143    PT Time Calculation (min) 43 min    Activity Tolerance Patient tolerated treatment well    Behavior During Therapy [facility] for tasks assessed/performed           Past Medical History:  Diagnosis Date  . Arthritis   . Chronic back pain    lumbar stenosis  . Early cataracts, bilateral   . GERD (gastroesophageal reflux disease)    occasionally but no meds required  . Headache(784.0)    occasionally  . History of blood transfusion   . History of colon polyps   . Hyperlipidemia    takes Lipitor every other day  . Hypertension    takes losartan daily  . Osteoarthritis of left knee 04/27/2014  . Shortness of breath    with exertion  . SOB (shortness of breath) 2013   CPET  . Tear of medial meniscus of left knee 09/08/2013    Past Surgical History:  Procedure Laterality Date  . APPENDECTOMY    . BACK SURGERY  2010   lumb fusion  . COLONOSCOPY    . EYE SURGERY     both cataracts  . KNEE ARTHROSCOPY WITH MEDIAL MENISECTOMY Left 09/08/2013   Procedure: LEFT KNEE ARTHROSCOPY WITH PARTIAL MEDIAL MENISCECTOMY;  Surgeon: [name], MD;  Location: [facility];  Service: Orthopedics;  Laterality: Left;  . LUMBAR LAMINECTOMY/DECOMPRESSION MICRODISCECTOMY  05/13/2012   Procedure: LUMBAR LAMINECTOMY/DECOMPRESSION MICRODISCECTOMY 1 LEVEL;  Surgeon: [name], MD;  Location: [place];  Service: Neurosurgery;  Laterality:  Bilateral;  Bilateral Lumbar one -two Decompressive Laminectomy  . PARTIAL KNEE ARTHROPLASTY Left 04/27/2014   Procedure: LEFT UNICOMPARTMENTAL KNEE;  Surgeon: [name], MD;  Location: [place] [facility];  Service: Orthopedics;  Laterality: Left;  . UPPER GI ENDOSCOPY      There were no vitals filed for this visit.    Subjective Assessment - 06/03/20 0942    Subjective Pt states ongoing pain in R side low back, into R hip, and also R knee pain. Pt has had multiple lumbar surgeries, does not feel she made good recovery from those. ALso had recentl injection in R knee, has had injections in lumbar spine as well. Does like to exercise at community center. States most pain in AM, and then at end of the day "im stooped over at end of the day"    Limitations Walking;House hold activities;Standing    Patient Stated Goals decreased pain    Currently in Pain? Yes    Pain Score 8     Pain Location Back    Pain Orientation Right    Pain Descriptors / Indicators Aching    Pain Type Acute pain    Pain Onset More than a month ago    Pain Frequency Intermittent    Aggravating Factors  worse in AM, better with movement, but pain at  end of day, standing, walking    Pain Score 7    Pain Location Knee    Pain Orientation Right    Pain Descriptors / Indicators Aching    Pain Type Chronic pain    Pain Onset More than a month ago    Pain Frequency Intermittent    Aggravating Factors  standing, walking              [facility] PT Assessment - 06/03/20 0001      Assessment   Medical Diagnosis low back pain, R knee pain    Referring Provider (PT) [name]    Prior Therapy in the past, for knees,       Balance Screen   Has the patient fallen in the past 6 months No      Prior Function   Level of Independence Independent      Cognition   Overall Cognitive Status Within Functional Limits for tasks assessed      ROM / Strength   AROM / PROM / Strength AROM;Strength      AROM    AROM Assessment Site Knee;Lumbar    Right/Left Knee Right    Right Knee Flexion 110    Lumbar Flexion mod limitation    Lumbar Extension mod limitation    Lumbar - Right Side Bend mod limitation/pain    Lumbar - Left Side Bend mild limitatoin      Strength   Overall Strength Comments hips: 4-/5 bil : knee: 4/5 bil       Palpation   Palpation comment R knee hypomobile;  Lumbar spine: fused;  Increased muscle tension at R lumbar, SI, into R glute;  Soreness at medial R knee and joint line       Ambulation/Gait   Gait Comments antalgic gait/ R knee                       Objective measurements completed on examination: See above findings.       [facility] Adult PT Treatment/Exercise - 06/03/20 0001      Exercises   Exercises Knee/Hip      Knee/Hip Exercises: Stretches   Other Knee/Hip Stretches SKTC, piriformis supine fig 4  ea 30 sec x 3 bil;        Knee/Hip Exercises: Aerobic   Recumbent Bike --      Knee/Hip Exercises: Sidelying   Hip ABduction 10 reps;Both      Manual Therapy   Manual Therapy Joint mobilization;Soft tissue mobilization;Passive ROM;Manual Traction    Soft tissue mobilization STM/DTM to R hip, gr troch, glute    Manual Traction Long leg distraction R, for lumbar pump x2 min                   PT Education - 06/03/20 1001    Education Details PT POC, exam findings, HEP    Person(s) Educated Patient    Methods Explanation;Demonstration;Tactile cues;Handout;Verbal cues    Comprehension Verbalized understanding;Returned demonstration;Verbal cues required;Tactile cues required;Need further instruction            PT Short Term Goals - 06/03/20 1029      PT SHORT TERM GOAL #1   Title Pt to be independent with initial HEP    Time 2    Period Weeks    Status New    Target Date 06/14/20             PT Long Term Goals - 06/03/20 1031  PT LONG TERM GOAL #1   Title Pt to be independent with final HEP    Time 8    Period  Weeks    Status New    Target Date 07/26/20      PT LONG TERM GOAL #2   Title Pt to report decreased pain in lumbar spine and LE to 0-2/10 in am, and with activity at end of the day    Time 8    Period Weeks    Status New    Target Date 07/26/20      PT LONG TERM GOAL #3   Title Pt to report decreased pain in R knee, to 0-3/10 with standing, walking, and IADLS.    Time 8    Period Weeks    Status New    Target Date 07/26/20      PT LONG TERM GOAL #4   Title Pt to demo improved strength of hips and knees to at least 4+/5 to improve stability , ambulation and pain,    Time 8    Period Weeks    Status New    Target Date 07/26/20                  Plan - 06/03/20 1035    Clinical Impression Statement Pt presents with primary complaint of increased pain in R low back, LE and R knee. Pt with soreness in R lumbar, SI, glute, hip, and in R knee. Pt with history of multiple back surgeries and has had injections in back and knee. Pt with decreased mobility of lumbar spine, and decreased strength of hips , knees, and core. Pt active, but lacking sufficient HEP for her Dx. Pt with decreased ability for full functional activites, gait, and IADLs, due to pain. Pt to benefit from skilled PT to improve.    Personal Factors and Comorbidities Comorbidity 1;Time since onset of injury/illness/exacerbation    Comorbidities several previous back surgeries and injections in back and knee, Knee OA.    Examination-Activity Limitations Stand;Locomotion Level;Stairs    Examination-Participation Restrictions Cleaning;Meal Prep;Yard Work;Community Activity;Laundry;Shop    Stability/Clinical Decision Making Evolving/Moderate complexity    Clinical Decision Making Moderate    Rehab Potential Good    PT Frequency 2x / week    PT Duration 6 weeks    PT Treatment/Interventions ADLs/Self Care Home Management;Cryotherapy;Electrical Stimulation;Iontophoresis 4mg /ml Dexamethasone;Moist  Heat;Traction;Ultrasound;Therapeutic exercise;Therapeutic activities;Functional mobility training;Stair training;Gait training;Balance training;Neuromuscular re-education;Patient/family education;Manual techniques;Vasopneumatic Device;Taping;Dry needling;Passive range of motion;Visual/perceptual remediation/compensation;Spinal Manipulations;Joint Manipulations;DME Instruction    Consulted and Agree with Plan of Care Patient           Patient will benefit from skilled therapeutic intervention in order to improve the following deficits and impairments:  Abnormal gait, Pain, Increased muscle spasms, Decreased scar mobility, Decreased mobility, Decreased activity tolerance, Decreased endurance, Decreased range of motion, Decreased strength, Difficulty walking, Decreased balance  Visit Diagnosis: Chronic bilateral low back pain with right-sided sciatica  Pain in right hip  Chronic pain of right knee     Problem List Patient Active Problem List   Diagnosis Date Noted  . SI (sacroiliac) pain 04/24/2020  . Trochanteric bursitis, right hip 04/24/2020  . Essential hypertension 01/12/2020  . Sinus bradycardia 01/12/2020  . Dyspnea on exertion 01/12/2020  . Lumbar stenosis with neurogenic claudication 02/01/2017  . Osteoarthritis of left knee 04/27/2014  . Tear of medial meniscus of left knee 09/08/2013    Lyndee Hensen, PT, DPT 10:46 AM  06/03/20    [facility]  [place], [place], [address] Phone: [phone]   Fax:  [phone]  Name: Melissa Maldonado MRN: [id number] Date of Birth: [birth year]

## 2020-06-05 ENCOUNTER — Ambulatory Visit (INDEPENDENT_AMBULATORY_CARE_PROVIDER_SITE_OTHER): Payer: Medicare Other | Admitting: Physical Therapy

## 2020-06-05 ENCOUNTER — Other Ambulatory Visit: Payer: Self-pay

## 2020-06-05 DIAGNOSIS — G8929 Other chronic pain: Secondary | ICD-10-CM

## 2020-06-05 DIAGNOSIS — M25551 Pain in right hip: Secondary | ICD-10-CM

## 2020-06-05 DIAGNOSIS — M25561 Pain in right knee: Secondary | ICD-10-CM

## 2020-06-05 DIAGNOSIS — M5441 Lumbago with sciatica, right side: Secondary | ICD-10-CM | POA: Diagnosis not present

## 2020-06-10 ENCOUNTER — Encounter: Payer: Self-pay | Admitting: Physical Therapy

## 2020-06-10 NOTE — Therapy (Signed)
[facility] [address] [place], [place], [address] Phone: [phone]   Fax:  [phone]  Physical Therapy Treatment  Patient Details  Name: Melissa Maldonado MRN: [id number] Date of Birth: 08/04/1940 Referring Provider (PT): [name]   Encounter Date: 06/05/2020   PT End of Session - 06/10/20 1126    Visit Number 3    Number of Visits 16    Date for PT Re-Evaluation 07/26/20    Authorization Type Medicare    Authorization Time Period 8 weeks    PT Start Time 1515    PT Stop Time 9518    PT Time Calculation (min) 40 min    Activity Tolerance Patient tolerated treatment well    Behavior During Therapy [facility] for tasks assessed/performed           Past Medical History:  Diagnosis Date   Arthritis    Chronic back pain    lumbar stenosis   Early cataracts, bilateral    GERD (gastroesophageal reflux disease)    occasionally but no meds required   Headache(784.0)    occasionally   History of blood transfusion    History of colon polyps    Hyperlipidemia    takes Lipitor every other day   Hypertension    takes losartan daily   Osteoarthritis of left knee 04/27/2014   Shortness of breath    with exertion   SOB (shortness of breath) 2013   CPET   Tear of medial meniscus of left knee 09/08/2013    Past Surgical History:  Procedure Laterality Date   APPENDECTOMY     BACK SURGERY  2010   lumb fusion   COLONOSCOPY     EYE SURGERY     both cataracts   KNEE ARTHROSCOPY WITH MEDIAL MENISECTOMY Left 09/08/2013   Procedure: LEFT KNEE ARTHROSCOPY WITH PARTIAL MEDIAL MENISCECTOMY;  Surgeon: [name], MD;  Location: [facility];  Service: Orthopedics;  Laterality: Left;   LUMBAR LAMINECTOMY/DECOMPRESSION MICRODISCECTOMY  05/13/2012   Procedure: LUMBAR LAMINECTOMY/DECOMPRESSION MICRODISCECTOMY 1 LEVEL;  Surgeon: [name], MD;  Location: [facility];  Service: Neurosurgery;  Laterality:  Bilateral;  Bilateral Lumbar one -two Decompressive Laminectomy   PARTIAL KNEE ARTHROPLASTY Left 04/27/2014   Procedure: LEFT UNICOMPARTMENTAL KNEE;  Surgeon: [name], MD;  Location: [facility];  Service: Orthopedics;  Laterality: Left;   UPPER GI ENDOSCOPY      There were no vitals filed for this visit.   Subjective Assessment - 06/10/20 1125    Subjective Pt states continued pain in evenings.    Currently in Pain? Yes                                       PT Short Term Goals - 06/03/20 1029      PT SHORT TERM GOAL #1   Title Pt to be independent with initial HEP    Time 2    Period Weeks    Status New    Target Date 06/14/20             PT Long Term Goals - 06/03/20 1031      PT LONG TERM GOAL #1   Title Pt to be independent with final HEP    Time 8    Period Weeks    Status New    Target Date 07/26/20  PT LONG TERM GOAL #2   Title Pt to report decreased pain in lumbar spine and LE to 0-2/10 in am, and with activity at end of the day    Time 8    Period Weeks    Status New    Target Date 07/26/20      PT LONG TERM GOAL #3   Title Pt to report decreased pain in R knee, to 0-3/10 with standing, walking, and IADLS.    Time 8    Period Weeks    Status New    Target Date 07/26/20      PT LONG TERM GOAL #4   Title Pt to demo improved strength of hips and knees to at least 4+/5 to improve stability , ambulation and pain,    Time 8    Period Weeks    Status New    Target Date 07/26/20                 Plan - 06/10/20 1126    Clinical Impression Statement Pt with most soreness in R lumbar region, Sore with light STM today. Pt improving with ability for ther ex, will benefit from continued mobility, strength, and manual for pain.    Personal Factors and Comorbidities Comorbidity 1;Time since onset of injury/illness/exacerbation    Comorbidities several previous back surgeries and injections in back  and knee, Knee OA.    Examination-Activity Limitations Stand;Locomotion Level;Stairs    Examination-Participation Restrictions Cleaning;Meal Prep;Yard Work;Community Activity;Laundry;Shop    Stability/Clinical Decision Making Evolving/Moderate complexity    Rehab Potential Good    PT Frequency 2x / week    PT Duration 6 weeks    PT Treatment/Interventions ADLs/Self Care Home Management;Cryotherapy;Electrical Stimulation;Iontophoresis 4mg /ml Dexamethasone;Moist Heat;Traction;Ultrasound;Therapeutic exercise;Therapeutic activities;Functional mobility training;Stair training;Gait training;Balance training;Neuromuscular re-education;Patient/family education;Manual techniques;Vasopneumatic Device;Taping;Dry needling;Passive range of motion;Visual/perceptual remediation/compensation;Spinal Manipulations;Joint Manipulations;DME Instruction    Consulted and Agree with Plan of Care Patient           Patient will benefit from skilled therapeutic intervention in order to improve the following deficits and impairments:  Abnormal gait, Pain, Increased muscle spasms, Decreased scar mobility, Decreased mobility, Decreased activity tolerance, Decreased endurance, Decreased range of motion, Decreased strength, Difficulty walking, Decreased balance  Visit Diagnosis: Chronic bilateral low back pain with right-sided sciatica  Pain in right hip  Chronic pain of right knee     Problem List Patient Active Problem List   Diagnosis Date Noted   SI (sacroiliac) pain 04/24/2020   Trochanteric bursitis, right hip 04/24/2020   Essential hypertension 01/12/2020   Sinus bradycardia 01/12/2020   Dyspnea on exertion 01/12/2020   Lumbar stenosis with neurogenic claudication 02/01/2017   Osteoarthritis of left knee 04/27/2014   Tear of medial meniscus of left knee 09/08/2013    Lyndee Hensen, PT, DPT 11:28 AM  06/10/20     [facility] [address]  [place], [place], [address] Phone: [phone]   Fax:  [phone]  Name: Melissa Maldonado MRN: [id number] Date of Birth: [birth year]

## 2020-06-12 ENCOUNTER — Other Ambulatory Visit: Payer: Self-pay

## 2020-06-12 ENCOUNTER — Ambulatory Visit (INDEPENDENT_AMBULATORY_CARE_PROVIDER_SITE_OTHER): Payer: Medicare Other | Admitting: Physical Therapy

## 2020-06-12 DIAGNOSIS — M25561 Pain in right knee: Secondary | ICD-10-CM | POA: Diagnosis not present

## 2020-06-12 DIAGNOSIS — M25551 Pain in right hip: Secondary | ICD-10-CM | POA: Diagnosis not present

## 2020-06-12 DIAGNOSIS — M5441 Lumbago with sciatica, right side: Secondary | ICD-10-CM

## 2020-06-12 DIAGNOSIS — G8929 Other chronic pain: Secondary | ICD-10-CM

## 2020-06-17 ENCOUNTER — Encounter: Payer: Self-pay | Admitting: Physical Therapy

## 2020-06-17 NOTE — Therapy (Signed)
[facility] [address] [place], [place], [address] Phone: [phone]   Fax:  [phone]  Physical Therapy Treatment  Patient Details  Name: Melissa Maldonado MRN: [id number] Date of Birth: February 16, 1941 Referring Provider (PT): [name]   Encounter Date: 06/12/2020   PT End of Session - 06/17/20 0913    Visit Number 4    Number of Visits 16    Date for PT Re-Evaluation 07/26/20    Authorization Type Medicare    Authorization Time Period 8 weeks    PT Start Time 1015    PT Stop Time 1100    PT Time Calculation (min) 45 min    Activity Tolerance Patient tolerated treatment well    Behavior During Therapy [facility] for tasks assessed/performed           Past Medical History:  Diagnosis Date   Arthritis    Chronic back pain    lumbar stenosis   Early cataracts, bilateral    GERD (gastroesophageal reflux disease)    occasionally but no meds required   Headache(784.0)    occasionally   History of blood transfusion    History of colon polyps    Hyperlipidemia    takes Lipitor every other day   Hypertension    takes losartan daily   Osteoarthritis of left knee 04/27/2014   Shortness of breath    with exertion   SOB (shortness of breath) 2013   CPET   Tear of medial meniscus of left knee 09/08/2013    Past Surgical History:  Procedure Laterality Date   APPENDECTOMY     BACK SURGERY  2010   lumb fusion   COLONOSCOPY     EYE SURGERY     both cataracts   KNEE ARTHROSCOPY WITH MEDIAL MENISECTOMY Left 09/08/2013   Procedure: LEFT KNEE ARTHROSCOPY WITH PARTIAL MEDIAL MENISCECTOMY;  Surgeon: [name], MD;  Location: [facility];  Service: Orthopedics;  Laterality: Left;   LUMBAR LAMINECTOMY/DECOMPRESSION MICRODISCECTOMY  05/13/2012   Procedure: LUMBAR LAMINECTOMY/DECOMPRESSION MICRODISCECTOMY 1 LEVEL;  Surgeon: [name], MD;  Location: [facility];  Service: Neurosurgery;  Laterality:  Bilateral;  Bilateral Lumbar one -two Decompressive Laminectomy   PARTIAL KNEE ARTHROPLASTY Left 04/27/2014   Procedure: LEFT UNICOMPARTMENTAL KNEE;  Surgeon: [name], MD;  Location: [facility];  Service: Orthopedics;  Laterality: Left;   UPPER GI ENDOSCOPY      There were no vitals filed for this visit.   Subjective Assessment - 06/17/20 0912    Subjective Pt states continued discomfort in evenings. Has been doing well with HEP    Currently in Pain? Yes    Pain Score 8     Pain Location Back    Pain Orientation Right    Pain Descriptors / Indicators Aching    Pain Type Acute pain    Pain Onset More than a month ago    Pain Frequency Intermittent    Pain Score 7    Pain Location Knee    Pain Orientation Right    Pain Descriptors / Indicators Aching    Pain Type Chronic pain    Pain Onset More than a month ago    Pain Frequency Intermittent                             [facility] Adult PT Treatment/Exercise - 06/17/20 0001      Ambulation/Gait   Gait  Comments antalgic gait/ R knee       Exercises   Exercises Knee/Hip      Knee/Hip Exercises: Stretches   Active Hamstring Stretch 2 reps;30 seconds    Active Hamstring Stretch Limitations seated    Other Knee/Hip Stretches SKTC, 30 sec x 3 bil;  standing QL stretch at wall 30 sec x 2 bil;     Other Knee/Hip Stretches pelvic tilts x 20;       Knee/Hip Exercises: Aerobic   Recumbent Bike L1 x 8 min       Knee/Hip Exercises: Seated   Long Arc Quad 20 reps;Both    Long Arc Quad Limitations 3 lb      Knee/Hip Exercises: Supine   Straight Leg Raises 2 sets;10 reps;Both    Other Supine Knee/Hip Exercises Clam, Supine march both with BlTB x 20, with TA    Other Supine Knee/Hip Exercises Supine march with TA x 20;       Knee/Hip Exercises: Sidelying   Hip ABduction Both;20 reps      Manual Therapy   Manual Therapy Joint mobilization;Soft tissue mobilization;Passive ROM;Manual Traction     Joint Mobilization --    Soft tissue mobilization STM/IASTM to R lumbar, SI.     Manual Traction Long leg distraction bil, for lumbar pump x2 min ea                      PT Short Term Goals - 06/03/20 1029      PT SHORT TERM GOAL #1   Title Pt to be independent with initial HEP    Time 2    Period Weeks    Status New    Target Date 06/14/20             PT Long Term Goals - 06/03/20 1031      PT LONG TERM GOAL #1   Title Pt to be independent with final HEP    Time 8    Period Weeks    Status New    Target Date 07/26/20      PT LONG TERM GOAL #2   Title Pt to report decreased pain in lumbar spine and LE to 0-2/10 in am, and with activity at end of the day    Time 8    Period Weeks    Status New    Target Date 07/26/20      PT LONG TERM GOAL #3   Title Pt to report decreased pain in R knee, to 0-3/10 with standing, walking, and IADLS.    Time 8    Period Weeks    Status New    Target Date 07/26/20      PT LONG TERM GOAL #4   Title Pt to demo improved strength of hips and knees to at least 4+/5 to improve stability , ambulation and pain,    Time 8    Period Weeks    Status New    Target Date 07/26/20                 Plan - 06/17/20 0916    Clinical Impression Statement Pt with much soreness wtih light STM to lumbar region, slightly improved from last visit. Pt continues to benefit from education on strengthening. Reviewed body mechanics for IADLS.    Personal Factors and Comorbidities Comorbidity 1;Time since onset of injury/illness/exacerbation    Comorbidities several previous back surgeries and injections in back and knee, Knee OA.  Examination-Activity Limitations Stand;Locomotion Level;Stairs    Examination-Participation Restrictions Cleaning;Meal Prep;Yard Work;Community Activity;Laundry;Shop    Stability/Clinical Decision Making Evolving/Moderate complexity    Rehab Potential Good    PT Frequency 2x / week    PT Duration 6 weeks     PT Treatment/Interventions ADLs/Self Care Home Management;Cryotherapy;Electrical Stimulation;Iontophoresis 4mg /ml Dexamethasone;Moist Heat;Traction;Ultrasound;Therapeutic exercise;Therapeutic activities;Functional mobility training;Stair training;Gait training;Balance training;Neuromuscular re-education;Patient/family education;Manual techniques;Vasopneumatic Device;Taping;Dry needling;Passive range of motion;Visual/perceptual remediation/compensation;Spinal Manipulations;Joint Manipulations;DME Instruction    Consulted and Agree with Plan of Care Patient           Patient will benefit from skilled therapeutic intervention in order to improve the following deficits and impairments:  Abnormal gait, Pain, Increased muscle spasms, Decreased scar mobility, Decreased mobility, Decreased activity tolerance, Decreased endurance, Decreased range of motion, Decreased strength, Difficulty walking, Decreased balance  Visit Diagnosis: Chronic bilateral low back pain with right-sided sciatica  Pain in right hip  Chronic pain of right knee     Problem List Patient Active Problem List   Diagnosis Date Noted   SI (sacroiliac) pain 04/24/2020   Trochanteric bursitis, right hip 04/24/2020   Essential hypertension 01/12/2020   Sinus bradycardia 01/12/2020   Dyspnea on exertion 01/12/2020   Lumbar stenosis with neurogenic claudication 02/01/2017   Osteoarthritis of left knee 04/27/2014   Tear of medial meniscus of left knee 09/08/2013    Lyndee Hensen, PT, DPT 9:20 AM  06/17/20    [facility] [address] [place], [place], [address] Phone: [phone]   Fax:  [phone]  Name: Melissa Maldonado MRN: [id number] Date of Birth: March 06, 1941

## 2020-06-18 ENCOUNTER — Encounter: Payer: Self-pay | Admitting: Physical Therapy

## 2020-06-18 ENCOUNTER — Ambulatory Visit (INDEPENDENT_AMBULATORY_CARE_PROVIDER_SITE_OTHER): Payer: Medicare Other | Admitting: Physical Therapy

## 2020-06-18 ENCOUNTER — Other Ambulatory Visit: Payer: Self-pay

## 2020-06-18 DIAGNOSIS — M25561 Pain in right knee: Secondary | ICD-10-CM | POA: Diagnosis not present

## 2020-06-18 DIAGNOSIS — M5441 Lumbago with sciatica, right side: Secondary | ICD-10-CM | POA: Diagnosis not present

## 2020-06-18 DIAGNOSIS — G8929 Other chronic pain: Secondary | ICD-10-CM | POA: Diagnosis not present

## 2020-06-18 DIAGNOSIS — M25551 Pain in right hip: Secondary | ICD-10-CM | POA: Diagnosis not present

## 2020-06-18 NOTE — Therapy (Signed)
[facility] [address] [place], [place], [address] Phone: [phone]   Fax:  [phone]  Physical Therapy Treatment  Patient Details  Name: Melissa Maldonado MRN: [id number] Date of Birth: [birth year] Referring Provider (PT): [name]   Encounter Date: 06/18/2020   PT End of Session - 06/18/20 1229    Visit Number 5    Number of Visits 16    Date for PT Re-Evaluation 07/26/20    Authorization Type Medicare    Authorization Time Period 8 weeks    PT Start Time 1108    PT Stop Time 1148    PT Time Calculation (min) 40 min    Activity Tolerance Patient tolerated treatment well    Behavior During Therapy [facility] for tasks assessed/performed           Past Medical History:  Diagnosis Date   Arthritis    Chronic back pain    lumbar stenosis   Early cataracts, bilateral    GERD (gastroesophageal reflux disease)    occasionally but no meds required   Headache(784.0)    occasionally   History of blood transfusion    History of colon polyps    Hyperlipidemia    takes Lipitor every other day   Hypertension    takes losartan daily   Osteoarthritis of left knee 04/27/2014   Shortness of breath    with exertion   SOB (shortness of breath) 2013   CPET   Tear of medial meniscus of left knee 09/08/2013    Past Surgical History:  Procedure Laterality Date   APPENDECTOMY     BACK SURGERY  2010   lumb fusion   COLONOSCOPY     EYE SURGERY     both cataracts   KNEE ARTHROSCOPY WITH MEDIAL MENISECTOMY Left 09/08/2013   Procedure: LEFT KNEE ARTHROSCOPY WITH PARTIAL MEDIAL MENISCECTOMY;  Surgeon: [name], MD;  Location: [place];  Service: Orthopedics;  Laterality: Left;   LUMBAR LAMINECTOMY/DECOMPRESSION MICRODISCECTOMY  05/13/2012   Procedure: LUMBAR LAMINECTOMY/DECOMPRESSION MICRODISCECTOMY 1 LEVEL;  Surgeon: [name], MD;  Location: [place] [facility];  Service: Neurosurgery;  Laterality:  Bilateral;  Bilateral Lumbar one -two Decompressive Laminectomy   PARTIAL KNEE ARTHROPLASTY Left 04/27/2014   Procedure: LEFT UNICOMPARTMENTAL KNEE;  Surgeon: [name], MD;  Location: [place];  Service: Orthopedics;  Laterality: Left;   UPPER GI ENDOSCOPY      There were no vitals filed for this visit.   Subjective Assessment - 06/18/20 1228    Subjective Pt states soreness in back. Has had mild improvments in pain. Got new pillow to use under knees for sleeping.    Currently in Pain? Yes    Pain Score 6     Pain Location Back    Pain Orientation Right    Pain Descriptors / Indicators Aching    Pain Type Acute pain    Pain Onset More than a month ago    Pain Frequency Intermittent                             [facility] Adult PT Treatment/Exercise - 06/18/20 0001      Ambulation/Gait   Gait Comments --      Exercises   Exercises Knee/Hip      Knee/Hip Exercises: Stretches   Active Hamstring Stretch 2 reps;30 seconds    Active Hamstring Stretch Limitations seated    Other Knee/Hip  Stretches SKTC, 30 sec x 3 bil;      Other Knee/Hip Stretches seated fwd flexion stretch 30 sec x 3;       Knee/Hip Exercises: Aerobic   Recumbent Bike L2 x 8 min       Knee/Hip Exercises: Standing   Knee Flexion 20 reps    Hip Abduction 20 reps      Knee/Hip Exercises: Seated   Long Arc Quad 20 reps;Both    Long Arc Quad Limitations 3 lb      Knee/Hip Exercises: Supine   Bridges with Clamshell 20 reps    Straight Leg Raises --    Other Supine Knee/Hip Exercises Clam, Supine march both with BlTB x 20, with TA    Other Supine Knee/Hip Exercises --      Knee/Hip Exercises: Sidelying   Hip ABduction --      Manual Therapy   Manual Therapy Joint mobilization;Soft tissue mobilization;Passive ROM;Manual Traction    Soft tissue mobilization STM/IASTM to R lumbar, SI.     Manual Traction Long leg distraction  for lumbar pump x2 min R                     PT Short Term Goals - 06/03/20 1029      PT SHORT TERM GOAL #1   Title Pt to be independent with initial HEP    Time 2    Period Weeks    Status New    Target Date 06/14/20             PT Long Term Goals - 06/03/20 1031      PT LONG TERM GOAL #1   Title Pt to be independent with final HEP    Time 8    Period Weeks    Status New    Target Date 07/26/20      PT LONG TERM GOAL #2   Title Pt to report decreased pain in lumbar spine and LE to 0-2/10 in am, and with activity at end of the day    Time 8    Period Weeks    Status New    Target Date 07/26/20      PT LONG TERM GOAL #3   Title Pt to report decreased pain in R knee, to 0-3/10 with standing, walking, and IADLS.    Time 8    Period Weeks    Status New    Target Date 07/26/20      PT LONG TERM GOAL #4   Title Pt to demo improved strength of hips and knees to at least 4+/5 to improve stability , ambulation and pain,    Time 8    Period Weeks    Status New    Target Date 07/26/20                 Plan - 06/18/20 1230    Clinical Impression Statement Pt with minimal soreness wtih ther ex, but does continue to have pain with STM/DTM to R lumbar and SI region. Pt still having pain in late afternoon, evenings. Pt to benefit from continued care.    Personal Factors and Comorbidities Comorbidity 1;Time since onset of injury/illness/exacerbation    Comorbidities several previous back surgeries and injections in back and knee, Knee OA.    Examination-Activity Limitations Stand;Locomotion Level;Stairs    Examination-Participation Restrictions Cleaning;Meal Prep;Yard Work;Community Activity;Laundry;Shop    Stability/Clinical Decision Making Evolving/Moderate complexity    Rehab Potential Good  PT Frequency 2x / week    PT Duration 6 weeks    PT Treatment/Interventions ADLs/Self Care Home Management;Cryotherapy;Electrical Stimulation;Iontophoresis 4mg /ml Dexamethasone;Moist  Heat;Traction;Ultrasound;Therapeutic exercise;Therapeutic activities;Functional mobility training;Stair training;Gait training;Balance training;Neuromuscular re-education;Patient/family education;Manual techniques;Vasopneumatic Device;Taping;Dry needling;Passive range of motion;Visual/perceptual remediation/compensation;Spinal Manipulations;Joint Manipulations;DME Instruction    Consulted and Agree with Plan of Care Patient           Patient will benefit from skilled therapeutic intervention in order to improve the following deficits and impairments:  Abnormal gait, Pain, Increased muscle spasms, Decreased scar mobility, Decreased mobility, Decreased activity tolerance, Decreased endurance, Decreased range of motion, Decreased strength, Difficulty walking, Decreased balance  Visit Diagnosis: Chronic bilateral low back pain with right-sided sciatica  Pain in right hip  Chronic pain of right knee     Problem List Patient Active Problem List   Diagnosis Date Noted   SI (sacroiliac) pain 04/24/2020   Trochanteric bursitis, right hip 04/24/2020   Essential hypertension 01/12/2020   Sinus bradycardia 01/12/2020   Dyspnea on exertion 01/12/2020   Lumbar stenosis with neurogenic claudication 02/01/2017   Osteoarthritis of left knee 04/27/2014   Tear of medial meniscus of left knee 09/08/2013    Lyndee Hensen, PT, DPT 12:44 PM  06/18/20    [facility] [address] [place], [place], [address] Phone: [phone]   Fax:  [phone]  Name: ARVIE VILLARRUEL MRN: [id number] Date of Birth: 1941/04/07

## 2020-06-20 ENCOUNTER — Other Ambulatory Visit: Payer: Self-pay

## 2020-06-20 ENCOUNTER — Ambulatory Visit (INDEPENDENT_AMBULATORY_CARE_PROVIDER_SITE_OTHER): Payer: Medicare Other | Admitting: Physical Therapy

## 2020-06-20 ENCOUNTER — Encounter: Payer: Self-pay | Admitting: Physical Therapy

## 2020-06-20 DIAGNOSIS — M25561 Pain in right knee: Secondary | ICD-10-CM | POA: Diagnosis not present

## 2020-06-20 DIAGNOSIS — M5441 Lumbago with sciatica, right side: Secondary | ICD-10-CM | POA: Diagnosis not present

## 2020-06-20 DIAGNOSIS — M25551 Pain in right hip: Secondary | ICD-10-CM

## 2020-06-20 DIAGNOSIS — G8929 Other chronic pain: Secondary | ICD-10-CM

## 2020-06-20 NOTE — Therapy (Signed)
[facility] [address] [place], [place], [address] Phone: [phone]   Fax:  [phone]  Physical Therapy Treatment  Patient Details  Name: Melissa Maldonado MRN: [id number] Date of Birth: 1940/11/23 Referring Provider (PT): [name]   Encounter Date: 06/20/2020   PT End of Session - 06/20/20 1133    Visit Number 6    Number of Visits 16    Date for PT Re-Evaluation 07/26/20    Authorization Type Medicare    Authorization Time Period 8 weeks    PT Start Time 1017    PT Stop Time 1101    PT Time Calculation (min) 44 min    Activity Tolerance Patient tolerated treatment well    Behavior During Therapy [facility] for tasks assessed/performed           Past Medical History:  Diagnosis Date   Arthritis    Chronic back pain    lumbar stenosis   Early cataracts, bilateral    GERD (gastroesophageal reflux disease)    occasionally but no meds required   Headache(784.0)    occasionally   History of blood transfusion    History of colon polyps    Hyperlipidemia    takes Lipitor every other day   Hypertension    takes losartan daily   Osteoarthritis of left knee 04/27/2014   Shortness of breath    with exertion   SOB (shortness of breath) 2013   CPET   Tear of medial meniscus of left knee 09/08/2013    Past Surgical History:  Procedure Laterality Date   APPENDECTOMY     BACK SURGERY  2010   lumb fusion   COLONOSCOPY     EYE SURGERY     both cataracts   KNEE ARTHROSCOPY WITH MEDIAL MENISECTOMY Left 09/08/2013   Procedure: LEFT KNEE ARTHROSCOPY WITH PARTIAL MEDIAL MENISCECTOMY;  Surgeon: [name], MD;  Location: [place];  Service: Orthopedics;  Laterality: Left;   LUMBAR LAMINECTOMY/DECOMPRESSION MICRODISCECTOMY  05/13/2012   Procedure: LUMBAR LAMINECTOMY/DECOMPRESSION MICRODISCECTOMY 1 LEVEL;  Surgeon: [name], MD;  Location: [place] [facility];  Service: Neurosurgery;  Laterality:  Bilateral;  Bilateral Lumbar one -two Decompressive Laminectomy   PARTIAL KNEE ARTHROPLASTY Left 04/27/2014   Procedure: LEFT UNICOMPARTMENTAL KNEE;  Surgeon: [name], MD;  Location: [facility];  Service: Orthopedics;  Laterality: Left;   UPPER GI ENDOSCOPY      There were no vitals filed for this visit.   Subjective Assessment - 06/20/20 1132    Subjective Pt states "tired" back at end of the day, and after increased standing/walking yesterday around the house. States pain is less, still rates 6/10, but states minimal pain, just "tired" feeling in back.    Currently in Pain? Yes    Pain Score 6     Pain Location Back    Pain Orientation Right    Pain Descriptors / Indicators Aching    Pain Type Acute pain    Pain Onset More than a month ago    Pain Frequency Intermittent                             [facility] Adult PT Treatment/Exercise - 06/20/20 1032      Exercises   Exercises Knee/Hip      Knee/Hip Exercises: Stretches   Active Hamstring Stretch 2 reps;30 seconds    Active Hamstring Stretch Limitations seated  Other Knee/Hip Stretches DKTC, 30 sec x 3 bil;      Other Knee/Hip Stretches --      Knee/Hip Exercises: Aerobic   Recumbent Bike L2 x 8 min       Knee/Hip Exercises: Standing   Knee Flexion --    Hip Abduction --      Knee/Hip Exercises: Seated   Long Arc Quad --    Long Arc Quad Limitations --      Knee/Hip Exercises: Supine   Bridges with Clamshell 20 reps    Other Supine Knee/Hip Exercises Clam, Supine march both with BlTB x 20, with TA      Manual Therapy   Manual Therapy Joint mobilization;Soft tissue mobilization;Passive ROM;Manual Traction    Soft tissue mobilization STM/IASTM to R lumbar, SI.     Manual Traction Long leg distraction  for lumbar pump x2 min R                    PT Short Term Goals - 06/03/20 1029      PT SHORT TERM GOAL #1   Title Pt to be independent with initial HEP    Time  2    Period Weeks    Status New    Target Date 06/14/20             PT Long Term Goals - 06/03/20 1031      PT LONG TERM GOAL #1   Title Pt to be independent with final HEP    Time 8    Period Weeks    Status New    Target Date 07/26/20      PT LONG TERM GOAL #2   Title Pt to report decreased pain in lumbar spine and LE to 0-2/10 in am, and with activity at end of the day    Time 8    Period Weeks    Status New    Target Date 07/26/20      PT LONG TERM GOAL #3   Title Pt to report decreased pain in R knee, to 0-3/10 with standing, walking, and IADLS.    Time 8    Period Weeks    Status New    Target Date 07/26/20      PT LONG TERM GOAL #4   Title Pt to demo improved strength of hips and knees to at least 4+/5 to improve stability , ambulation and pain,    Time 8    Period Weeks    Status New    Target Date 07/26/20                 Plan - 06/20/20 1146    Clinical Impression Statement Pt with improving pain with palpation and manual at  R lumbar, SI, and glute. Improving with ability for ther ex. Pt reproting less pain in evenings.    Personal Factors and Comorbidities Comorbidity 1;Time since onset of injury/illness/exacerbation    Comorbidities several previous back surgeries and injections in back and knee, Knee OA.    Examination-Activity Limitations Stand;Locomotion Level;Stairs    Examination-Participation Restrictions Cleaning;Meal Prep;Yard Work;Community Activity;Laundry;Shop    Stability/Clinical Decision Making Evolving/Moderate complexity    Rehab Potential Good    PT Frequency 2x / week    PT Duration 6 weeks    PT Treatment/Interventions ADLs/Self Care Home Management;Cryotherapy;Electrical Stimulation;Iontophoresis 4mg /ml Dexamethasone;Moist Heat;Traction;Ultrasound;Therapeutic exercise;Therapeutic activities;Functional mobility training;Stair training;Gait training;Balance training;Neuromuscular re-education;Patient/family education;Manual  techniques;Vasopneumatic Device;Taping;Dry needling;Passive range of motion;Visual/perceptual remediation/compensation;Spinal Manipulations;Joint Manipulations;DME Instruction  Consulted and Agree with Plan of Care Patient           Patient will benefit from skilled therapeutic intervention in order to improve the following deficits and impairments:  Abnormal gait, Pain, Increased muscle spasms, Decreased scar mobility, Decreased mobility, Decreased activity tolerance, Decreased endurance, Decreased range of motion, Decreased strength, Difficulty walking, Decreased balance  Visit Diagnosis: Chronic bilateral low back pain with right-sided sciatica  Pain in right hip  Chronic pain of right knee     Problem List Patient Active Problem List   Diagnosis Date Noted   SI (sacroiliac) pain 04/24/2020   Trochanteric bursitis, right hip 04/24/2020   Essential hypertension 01/12/2020   Sinus bradycardia 01/12/2020   Dyspnea on exertion 01/12/2020   Lumbar stenosis with neurogenic claudication 02/01/2017   Osteoarthritis of left knee 04/27/2014   Tear of medial meniscus of left knee 09/08/2013    Lyndee Hensen, PT, DPT 11:48 AM  06/20/20    [facility] [address] [place], [place], [address] Phone: [phone]   Fax:  [phone]  Name: Melissa Maldonado MRN: [id number] Date of Birth: [birth year]

## 2020-06-21 NOTE — Progress Notes (Signed)
I, Melissa Maldonado, LAT, ATC, am serving as scribe for Dr. [name].  Melissa Maldonado is a 79 y.o. female who presents to [facility] today for f/u of chronic LBP and R knee/lower leg pain.  She was last seen by [name] on 05/13/20 and had a R knee injection and was referred to outpatient PT.  She has completed 6 PT visits.  Since her last visit, pt reports pain is not as debilitating as it used to be. Pt locates pain to be R side low back/buttock and pain in the posterior aspect of knee. Bilat great toe numbness. Rx tried: HEP  Diagnostic testing: L-spine CT and myelogram- 04/22/20; CT L-spine- 09/15/17; L-spine XR- 02/01/17  Pertinent review of systems: No fevers or chills  Relevant historical information: Multilevel lumbar fusion.   Exam:  BP 132/88 (BP Location: Right Arm, Patient Position: Sitting, Cuff Size: Normal)   Pulse (!) 46   Ht 5\' 5"  (1.651 m)   Wt 193 lb (87.5 kg)   SpO2 97%   BMI 32.12 kg/m  General: Well Developed, well nourished, and in no acute distress.   MSK: L-spine normal-appearing nontender midline.  Decreased lumbar motion.    Lab and Radiology Results EXAM: LUMBAR MYELOGRAM  CT LUMBAR MYELOGRAM  FLUOROSCOPY TIME:  Radiation Exposure Index (as provided by the fluoroscopic device): 13.8 mGy  Fluoroscopy Time:  38 seconds  Number of Acquired Images:  15  PROCEDURE: After thorough discussion of risks and benefits of the procedure including bleeding, infection, injury to nerves, blood vessels, adjacent structures as well as headache and CSF leak, written and oral informed consent was obtained. Consent was obtained by Dr. Fabiola Backer. Time out form was completed.  Patient was positioned prone on the fluoroscopy table. Local anesthesia was provided with 1% lidocaine without epinephrine after prepped and draped in the usual sterile fashion. Puncture was performed at L4-L5 using a 3 1/2 inch 22-gauge spinal  needle via right paramedian approach. Using a single pass through the dura, the needle was placed within the thecal sac, with return of clear CSF. 15 mL of Isovue M-200 was injected into the thecal sac, with normal opacification of the nerve roots and cauda equina consistent with free flow within the subarachnoid space.  I personally performed the lumbar puncture and administered the intrathecal contrast. I also personally supervised acquisition of the myelogram images.  TECHNIQUE: Contiguous axial images were obtained through the lumbar spine after the intrathecal infusion of contrast. Coronal and sagittal reconstructions were obtained of the axial image sets.  COMPARISON:  CT lumbar spine dated September 15, 2017. CT lumbar myelogram dated Nov 25, 2016.  FINDINGS: LUMBAR MYELOGRAM FINDINGS:  Unchanged mild anterolisthesis at L4-L5. No dynamic instability. Prior L1-L5 PLIF with removal of the L3 pedicle screws. Small ventral extradural defect at L1-L2. No spinal canal stenosis or nerve root effacement.  CT LUMBAR MYELOGRAM FINDINGS:  Segmentation: Transitional lumbosacral anatomy again noted with partial sacralization of L5 on the right.  Alignment: Unchanged fused 4 mm anterolisthesis at L4-L5.  Vertebrae: Similar appearing solid interbody and posterior fusion from L1-L5. Pedicle screws at L1-L2 and L4-L5 without hardware complication. Previously removed L3 pedicle screws. No acute fracture or other focal pathologic process.  Conus medullaris and cauda equina: Conus extends to the L1 level. Conus and cauda equina appear normal.  Paraspinal and other soft tissues: Postsurgical changes and heterotopic ossification in the paraspinous soft tissues. Mild bilateral sacroiliac joint osteoarthritis.  Disc  levels:  T11-T12: Partially visualized left-sided disc protrusion with effacement of the left ventral thecal sac, new since 2018.  T12-L1: Negative disc.  Unchanged mild bilateral facet arthropathy. No stenosis.  L1-L2: Prior PLIF with solid interbody and posterior fusion. Residual posterior endplate ridging with mild bilateral neuroforaminal stenosis due to bony hypertrophy. No spinal canal stenosis.  L2-L3:  Solid interbody and posterior fusion.  No residual stenosis.  L3-L4: Prior posterior decompression with solid interbody and posterior fusion. No residual stenosis.  L4-L5: Prior posterior decompression with solid interbody and posterior fusion. No residual stenosis.  L5-S1: Unchanged disc bulging and for lateral endplate spurring. Unchanged severe bilateral facet arthropathy. No spinal canal or neuroforaminal stenosis. Right transverse process and sacrum pseudoarthrosis with spurring contributes to impingement of the extraforaminal right L5 nerve root (series 7, image 27), similar to prior study.  IMPRESSION: 1. Prior L1-L5 solid arthrodesis with unchanged residual mild bilateral neuroforaminal stenosis at L1-L2. 2. Similar appearing right L5 transverse process and sacrum pseudoarthrosis with spurring impinging on the extraforaminal right L5 nerve root. 3. New partially visualized left-sided disc protrusion at T11-T12 with effacement of the left ventral thecal sac.   Electronically Signed   By: Titus Dubin M.D.   On: 04/22/2020 12:47 I, [name], personally (independently) visualized and performed the interpretation of the images attached in this note.    Assessment and Plan: 79 y.o. female with chronic right-sided low back pain with bilateral toe numbness.  Pain thought to be due to degenerative changes and postsurgical changes in lumbar spine.  Patient had trial of SI joint injection with very little benefit.  Fortunately she has improved moderately with physical therapy.  We discussed options going forward including epidural steroid injection at L5-S1 hoping to treat the right L5 nerve root impingement  seen on CT myelogram or possibly L5-S1 facet injections.  She would like to maximize physical therapy and has about a month left.  Plan to complete physical therapy.  She will let me know if not good enough at that point.  At that point would likely proceed with epidural steroid injection next step.      Discussed warning signs or symptoms. Please see discharge instructions. Patient expresses understanding.   The above documentation has been reviewed and is accurate and complete [name], M.D.  Total encounter time 20 minutes including face-to-face time with the patient and, reviewing past medical record, and charting on the date of service.   Plan and next steps

## 2020-06-24 ENCOUNTER — Other Ambulatory Visit: Payer: Self-pay

## 2020-06-24 ENCOUNTER — Ambulatory Visit: Payer: Self-pay

## 2020-06-24 ENCOUNTER — Ambulatory Visit (INDEPENDENT_AMBULATORY_CARE_PROVIDER_SITE_OTHER): Payer: Medicare Other | Admitting: Family Medicine

## 2020-06-24 VITALS — BP 132/88 | HR 46 | Ht 65.0 in | Wt 193.0 lb

## 2020-06-24 DIAGNOSIS — M48062 Spinal stenosis, lumbar region with neurogenic claudication: Secondary | ICD-10-CM

## 2020-06-24 NOTE — Patient Instructions (Signed)
Thank you for coming in today.  Continue PT.  If not better let me know.  We can do several different types of back injections.  Let me know if you want me to order them.

## 2020-06-25 ENCOUNTER — Encounter: Payer: Self-pay | Admitting: Physical Therapy

## 2020-06-25 ENCOUNTER — Ambulatory Visit (INDEPENDENT_AMBULATORY_CARE_PROVIDER_SITE_OTHER): Payer: Medicare Other | Admitting: Physical Therapy

## 2020-06-25 DIAGNOSIS — M25561 Pain in right knee: Secondary | ICD-10-CM | POA: Diagnosis not present

## 2020-06-25 DIAGNOSIS — M25551 Pain in right hip: Secondary | ICD-10-CM

## 2020-06-25 DIAGNOSIS — M5441 Lumbago with sciatica, right side: Secondary | ICD-10-CM

## 2020-06-25 DIAGNOSIS — G8929 Other chronic pain: Secondary | ICD-10-CM | POA: Diagnosis not present

## 2020-06-25 NOTE — Therapy (Signed)
[facility] [address] [place], [place], [address] Phone: [phone]   Fax:  [phone]  Physical Therapy Treatment  Patient Details  Name: Melissa Maldonado MRN: [id number] Date of Birth: [birth year] Referring Provider (PT): [name]   Encounter Date: 06/25/2020   PT End of Session - 06/25/20 0936    Visit Number 7    Number of Visits 16    Date for PT Re-Evaluation 07/26/20    Authorization Type Medicare    Authorization Time Period 8 weeks    PT Start Time 0929    PT Stop Time 1013    PT Time Calculation (min) 44 min    Activity Tolerance Patient tolerated treatment well    Behavior During Therapy [facility] for tasks assessed/performed           Past Medical History:  Diagnosis Date  . Arthritis   . Chronic back pain    lumbar stenosis  . Early cataracts, bilateral   . GERD (gastroesophageal reflux disease)    occasionally but no meds required  . Headache(784.0)    occasionally  . History of blood transfusion   . History of colon polyps   . Hyperlipidemia    takes Lipitor every other day  . Hypertension    takes losartan daily  . Osteoarthritis of left knee 04/27/2014  . Shortness of breath    with exertion  . SOB (shortness of breath) 2013   CPET  . Tear of medial meniscus of left knee 09/08/2013    Past Surgical History:  Procedure Laterality Date  . APPENDECTOMY    . BACK SURGERY  2010   lumb fusion  . COLONOSCOPY    . EYE SURGERY     both cataracts  . KNEE ARTHROSCOPY WITH MEDIAL MENISECTOMY Left 09/08/2013   Procedure: LEFT KNEE ARTHROSCOPY WITH PARTIAL MEDIAL MENISCECTOMY;  Surgeon: [name], MD;  Location: [facility];  Service: Orthopedics;  Laterality: Left;  . LUMBAR LAMINECTOMY/DECOMPRESSION MICRODISCECTOMY  05/13/2012   Procedure: LUMBAR LAMINECTOMY/DECOMPRESSION MICRODISCECTOMY 1 LEVEL;  Surgeon: [name], MD;  Location: [place] [facility];  Service: Neurosurgery;  Laterality:  Bilateral;  Bilateral Lumbar one -two Decompressive Laminectomy  . PARTIAL KNEE ARTHROPLASTY Left 04/27/2014   Procedure: LEFT UNICOMPARTMENTAL KNEE;  Surgeon: [name], MD;  Location: [place];  Service: Orthopedics;  Laterality: Left;  . UPPER GI ENDOSCOPY      There were no vitals filed for this visit.   Subjective Assessment - 06/25/20 0935    Subjective Pt states bil knee soreness. Back "not bad" , but tired at end of day.    Currently in Pain? Yes    Pain Score 6     Pain Location Back    Pain Orientation Right;Left    Pain Descriptors / Indicators Aching    Pain Type Acute pain    Pain Onset More than a month ago    Pain Frequency Intermittent    Pain Score 5    Pain Location Knee    Pain Orientation Right;Left    Pain Descriptors / Indicators Aching    Pain Type Chronic pain    Pain Onset More than a month ago    Pain Frequency Intermittent                             [facility] Adult PT Treatment/Exercise - 06/25/20 0937      Exercises  Exercises Knee/Hip      Knee/Hip Exercises: Stretches   Active Hamstring Stretch 2 reps;30 seconds    Active Hamstring Stretch Limitations seated    Other Knee/Hip Stretches DKTC, 30 sec x 3 bil;      Other Knee/Hip Stretches seated fwd flexion stretch 30 sec x 3;       Knee/Hip Exercises: Aerobic   Recumbent Bike L1 x 10 min       Knee/Hip Exercises: Standing   Knee Flexion 20 reps    Hip Abduction 20 reps      Knee/Hip Exercises: Seated   Long Arc Quad 20 reps;Both    Long Arc Quad Limitations 3 lb      Knee/Hip Exercises: Supine   Bridges with Clamshell --    Straight Leg Raises 2 sets;10 reps;Both    Straight Leg Raises Limitations with tA    Other Supine Knee/Hip Exercises Clam, Supine march both with BlTB x 20, with TA      Manual Therapy   Manual Therapy Joint mobilization;Soft tissue mobilization;Passive ROM;Manual Traction    Soft tissue mobilization STM/DTM to R lumbar,  SI.     Passive ROM PROM for bil knee flex/ext    Manual Traction Long leg distraction  for lumbar pump x2 min R                    PT Short Term Goals - 06/03/20 1029      PT SHORT TERM GOAL #1   Title Pt to be independent with initial HEP    Time 2    Period Weeks    Status New    Target Date 06/14/20             PT Long Term Goals - 06/03/20 1031      PT LONG TERM GOAL #1   Title Pt to be independent with final HEP    Time 8    Period Weeks    Status New    Target Date 07/26/20      PT LONG TERM GOAL #2   Title Pt to report decreased pain in lumbar spine and LE to 0-2/10 in am, and with activity at end of the day    Time 8    Period Weeks    Status New    Target Date 07/26/20      PT LONG TERM GOAL #3   Title Pt to report decreased pain in R knee, to 0-3/10 with standing, walking, and IADLS.    Time 8    Period Weeks    Status New    Target Date 07/26/20      PT LONG TERM GOAL #4   Title Pt to demo improved strength of hips and knees to at least 4+/5 to improve stability , ambulation and pain,    Time 8    Period Weeks    Status New    Target Date 07/26/20                 Plan - 06/25/20 1136    Clinical Impression Statement Pt with improving soreness in glute, sore in SI today. Continued manual for lumbar decompression and DTM. THer ex progressed for lumbar flexion as well as strengthening. Pt to benefit from continued care.    Personal Factors and Comorbidities Comorbidity 1;Time since onset of injury/illness/exacerbation    Comorbidities several previous back surgeries and injections in back and knee, Knee OA.    Examination-Activity Limitations  Stand;Locomotion Level;Stairs    Examination-Participation Restrictions Cleaning;Meal Prep;Yard Work;Community Activity;Laundry;Shop    Stability/Clinical Decision Making Evolving/Moderate complexity    Rehab Potential Good    PT Frequency 2x / week    PT Duration 6 weeks    PT  Treatment/Interventions ADLs/Self Care Home Management;Cryotherapy;Electrical Stimulation;Iontophoresis 4mg /ml Dexamethasone;Moist Heat;Traction;Ultrasound;Therapeutic exercise;Therapeutic activities;Functional mobility training;Stair training;Gait training;Balance training;Neuromuscular re-education;Patient/family education;Manual techniques;Vasopneumatic Device;Taping;Dry needling;Passive range of motion;Visual/perceptual remediation/compensation;Spinal Manipulations;Joint Manipulations;DME Instruction    Consulted and Agree with Plan of Care Patient           Patient will benefit from skilled therapeutic intervention in order to improve the following deficits and impairments:  Abnormal gait, Pain, Increased muscle spasms, Decreased scar mobility, Decreased mobility, Decreased activity tolerance, Decreased endurance, Decreased range of motion, Decreased strength, Difficulty walking, Decreased balance  Visit Diagnosis: Chronic bilateral low back pain with right-sided sciatica  Pain in right hip  Chronic pain of right knee     Problem List Patient Active Problem List   Diagnosis Date Noted  . Trochanteric bursitis, right hip 04/24/2020  . Essential hypertension 01/12/2020  . Sinus bradycardia 01/12/2020  . Dyspnea on exertion 01/12/2020  . Lumbar stenosis with neurogenic claudication 02/01/2017  . Osteoarthritis of left knee 04/27/2014  . Tear of medial meniscus of left knee 09/08/2013    Lyndee Hensen, PT, DPT 11:38 AM  06/25/20    [facility] [place], [place], [address] Phone: [phone]   Fax:  [phone]  Name: Melissa Maldonado MRN: [id number] Date of Birth: 1940/09/23

## 2020-06-27 ENCOUNTER — Encounter: Payer: Self-pay | Admitting: Physical Therapy

## 2020-06-27 ENCOUNTER — Ambulatory Visit (INDEPENDENT_AMBULATORY_CARE_PROVIDER_SITE_OTHER): Payer: Medicare Other | Admitting: Physical Therapy

## 2020-06-27 ENCOUNTER — Other Ambulatory Visit: Payer: Self-pay

## 2020-06-27 DIAGNOSIS — G8929 Other chronic pain: Secondary | ICD-10-CM | POA: Diagnosis not present

## 2020-06-27 DIAGNOSIS — M5441 Lumbago with sciatica, right side: Secondary | ICD-10-CM

## 2020-06-27 DIAGNOSIS — M25551 Pain in right hip: Secondary | ICD-10-CM

## 2020-06-27 DIAGNOSIS — M25561 Pain in right knee: Secondary | ICD-10-CM

## 2020-06-27 NOTE — Therapy (Signed)
[facility] [address] [place], [place], [address] Phone: [phone]   Fax:  [phone]  Physical Therapy Treatment  Patient Details  Name: Melissa Maldonado MRN: [id number] Date of Birth: 01/10/1941 Referring Provider (PT): [name]   Encounter Date: 06/27/2020   PT End of Session - 06/27/20 1147    Visit Number 8    Number of Visits 16    Date for PT Re-Evaluation 07/26/20    Authorization Type Medicare    Authorization Time Period 8 weeks    PT Start Time 1015    PT Stop Time 1100    PT Time Calculation (min) 45 min    Activity Tolerance Patient tolerated treatment well    Behavior During Therapy [facility] for tasks assessed/performed           Past Medical History:  Diagnosis Date  . Arthritis   . Chronic back pain    lumbar stenosis  . Early cataracts, bilateral   . GERD (gastroesophageal reflux disease)    occasionally but no meds required  . Headache(784.0)    occasionally  . History of blood transfusion   . History of colon polyps   . Hyperlipidemia    takes Lipitor every other day  . Hypertension    takes losartan daily  . Osteoarthritis of left knee 04/27/2014  . Shortness of breath    with exertion  . SOB (shortness of breath) 2013   CPET  . Tear of medial meniscus of left knee 09/08/2013    Past Surgical History:  Procedure Laterality Date  . APPENDECTOMY    . BACK SURGERY  2010   lumb fusion  . COLONOSCOPY    . EYE SURGERY     both cataracts  . KNEE ARTHROSCOPY WITH MEDIAL MENISECTOMY Left 09/08/2013   Procedure: LEFT KNEE ARTHROSCOPY WITH PARTIAL MEDIAL MENISCECTOMY;  Surgeon: [name], MD;  Location: [facility];  Service: Orthopedics;  Laterality: Left;  . LUMBAR LAMINECTOMY/DECOMPRESSION MICRODISCECTOMY  05/13/2012   Procedure: LUMBAR LAMINECTOMY/DECOMPRESSION MICRODISCECTOMY 1 LEVEL;  Surgeon: [name], MD;  Location: [place] [facility];  Service: Neurosurgery;  Laterality:  Bilateral;  Bilateral Lumbar one -two Decompressive Laminectomy  . PARTIAL KNEE ARTHROPLASTY Left 04/27/2014   Procedure: LEFT UNICOMPARTMENTAL KNEE;  Surgeon: [name], MD;  Location: [place];  Service: Orthopedics;  Laterality: Left;  . UPPER GI ENDOSCOPY      There were no vitals filed for this visit.   Subjective Assessment - 06/27/20 1146    Subjective Pt states much less "pain"  in back and knees. Does feel tired and back tight at end of day, but pain less.    Currently in Pain? Yes    Pain Score 5     Pain Location Back    Pain Orientation Right;Left    Pain Type Acute pain    Pain Onset More than a month ago    Pain Frequency Intermittent    Pain Score 5    Pain Location Knee    Pain Orientation Right;Left    Pain Descriptors / Indicators Aching    Pain Type Chronic pain    Pain Onset More than a month ago    Pain Frequency Intermittent                             [facility] Adult PT Treatment/Exercise - 06/27/20 0001      Exercises  Exercises Knee/Hip      Knee/Hip Exercises: Stretches   Active Hamstring Stretch 2 reps;30 seconds    Active Hamstring Stretch Limitations seated    Other Knee/Hip Stretches DKTC, 30 sec x 3 bil;      Other Knee/Hip Stretches seated fwd flexion stretch 30 sec x 3;       Knee/Hip Exercises: Aerobic   Recumbent Bike L2  x 8 min      Knee/Hip Exercises: Standing   Hip Abduction 20 reps    Hip Extension 10 reps;Both    Forward Step Up 10 reps;Both;Step Height: 6";Hand Hold: 0    Other Standing Knee Exercises Row GTB x 20;      Knee/Hip Exercises: Seated   Long Arc Quad 20 reps;Both    Long Arc Quad Limitations 3 lb      Knee/Hip Exercises: Supine   Bridges with Clamshell 20 reps    Straight Leg Raises 2 sets;10 reps;Both    Straight Leg Raises Limitations with tA    Other Supine Knee/Hip Exercises Supine march both with BlTB x 20, with TA      Manual Therapy   Manual Therapy Joint  mobilization;Soft tissue mobilization;Passive ROM;Manual Traction    Soft tissue mobilization STM/DTM to R lumbar, SI.     Manual Traction Long leg distraction  for lumbar pump x2 min R                    PT Short Term Goals - 06/03/20 1029      PT SHORT TERM GOAL #1   Title Pt to be independent with initial HEP    Time 2    Period Weeks    Status New    Target Date 06/14/20             PT Long Term Goals - 06/03/20 1031      PT LONG TERM GOAL #1   Title Pt to be independent with final HEP    Time 8    Period Weeks    Status New    Target Date 07/26/20      PT LONG TERM GOAL #2   Title Pt to report decreased pain in lumbar spine and LE to 0-2/10 in am, and with activity at end of the day    Time 8    Period Weeks    Status New    Target Date 07/26/20      PT LONG TERM GOAL #3   Title Pt to report decreased pain in R knee, to 0-3/10 with standing, walking, and IADLS.    Time 8    Period Weeks    Status New    Target Date 07/26/20      PT LONG TERM GOAL #4   Title Pt to demo improved strength of hips and knees to at least 4+/5 to improve stability , ambulation and pain,    Time 8    Period Weeks    Status New    Target Date 07/26/20                 Plan - 06/27/20 1148    Clinical Impression Statement Pt progressing well. Improving tolerance and ability for strengthening and HEP. Also with improving mobility of hips and back. Lumbar ROM limited due to fusion. Pt to benefit from continued care, for 1 more week, likley d/c at end of week, will continue to educate and finalize HEP, as well as review lifitng, bending  mechanics again for IADLs.    Personal Factors and Comorbidities Comorbidity 1;Time since onset of injury/illness/exacerbation    Comorbidities several previous back surgeries and injections in back and knee, Knee OA.    Examination-Activity Limitations Stand;Locomotion Level;Stairs    Examination-Participation Restrictions  Cleaning;Meal Prep;Yard Work;Community Activity;Laundry;Shop    Stability/Clinical Decision Making Evolving/Moderate complexity    Rehab Potential Good    PT Frequency 2x / week    PT Duration 6 weeks    PT Treatment/Interventions ADLs/Self Care Home Management;Cryotherapy;Electrical Stimulation;Iontophoresis 4mg /ml Dexamethasone;Moist Heat;Traction;Ultrasound;Therapeutic exercise;Therapeutic activities;Functional mobility training;Stair training;Gait training;Balance training;Neuromuscular re-education;Patient/family education;Manual techniques;Vasopneumatic Device;Taping;Dry needling;Passive range of motion;Visual/perceptual remediation/compensation;Spinal Manipulations;Joint Manipulations;DME Instruction    Consulted and Agree with Plan of Care Patient           Patient will benefit from skilled therapeutic intervention in order to improve the following deficits and impairments:  Abnormal gait,Pain,Increased muscle spasms,Decreased scar mobility,Decreased mobility,Decreased activity tolerance,Decreased endurance,Decreased range of motion,Decreased strength,Difficulty walking,Decreased balance  Visit Diagnosis: Chronic bilateral low back pain with right-sided sciatica  Pain in right hip  Chronic pain of right knee     Problem List Patient Active Problem List   Diagnosis Date Noted  . Trochanteric bursitis, right hip 04/24/2020  . Essential hypertension 01/12/2020  . Sinus bradycardia 01/12/2020  . Dyspnea on exertion 01/12/2020  . Lumbar stenosis with neurogenic claudication 02/01/2017  . Osteoarthritis of left knee 04/27/2014  . Tear of medial meniscus of left knee 09/08/2013    Lyndee Hensen, PT, DPT 11:50 AM  06/27/20    [facility] [place], [place], [address] Phone: [phone]   Fax:  [phone]  Name: Melissa Maldonado MRN: [id number] Date of Birth: Sep 10, 1940

## 2020-07-02 ENCOUNTER — Other Ambulatory Visit: Payer: Self-pay

## 2020-07-02 ENCOUNTER — Encounter: Payer: Self-pay | Admitting: Physical Therapy

## 2020-07-02 ENCOUNTER — Ambulatory Visit (INDEPENDENT_AMBULATORY_CARE_PROVIDER_SITE_OTHER): Payer: Medicare Other | Admitting: Physical Therapy

## 2020-07-02 DIAGNOSIS — M5441 Lumbago with sciatica, right side: Secondary | ICD-10-CM

## 2020-07-02 DIAGNOSIS — G8929 Other chronic pain: Secondary | ICD-10-CM | POA: Diagnosis not present

## 2020-07-02 DIAGNOSIS — M25561 Pain in right knee: Secondary | ICD-10-CM | POA: Diagnosis not present

## 2020-07-02 DIAGNOSIS — M25551 Pain in right hip: Secondary | ICD-10-CM | POA: Diagnosis not present

## 2020-07-02 NOTE — Patient Instructions (Signed)
Access Code: [email or web address] URL: [email or web address] Date: 07/02/2020 Prepared by: Lyndee Hensen  Exercises Supine Single Knee to Chest Stretch - 2 x daily - 3 reps - 30 hold Supine Piriformis Stretch Pulling Heel to Hip - 2 x daily - 3 reps - 30 hold Seated Flexion Stretch - 2 x daily - 3 reps - 30 hold Seated Hamstring Stretch - 2 x daily - 3 reps - 30 hold Hooklying Clamshell with Resistance - 1 x daily - 2 sets - 10 reps Supine Bridge - 1 x daily - 2 sets - 10 reps Sidelying Hip Abduction - 1 x daily - 2 sets - 10 reps Straight Leg Raise - 1 x daily - 1 sets - 10 reps Seated Knee Extension AROM - 2 x daily - 2 sets - 10 reps

## 2020-07-02 NOTE — Therapy (Signed)
[facility] [address] [place], [place], [address] Phone: [phone]   Fax:  [phone]  Physical Therapy Treatment  Patient Details  Name: ROSANNE WOHLFARTH MRN: [id number] Date of Birth: August 23, 1940 Referring Provider (PT): [name]   Encounter Date: 07/02/2020   PT End of Session - 07/02/20 0954    Visit Number 9    Number of Visits 16    Date for PT Re-Evaluation 07/26/20    Authorization Type Medicare    Authorization Time Period 8 weeks    PT Start Time 0935    PT Stop Time 1015    PT Time Calculation (min) 40 min    Activity Tolerance Patient tolerated treatment well    Behavior During Therapy [facility] for tasks assessed/performed           Past Medical History:  Diagnosis Date  . Arthritis   . Chronic back pain    lumbar stenosis  . Early cataracts, bilateral   . GERD (gastroesophageal reflux disease)    occasionally but no meds required  . Headache(784.0)    occasionally  . History of blood transfusion   . History of colon polyps   . Hyperlipidemia    takes Lipitor every other day  . Hypertension    takes losartan daily  . Osteoarthritis of left knee 04/27/2014  . Shortness of breath    with exertion  . SOB (shortness of breath) 2013   CPET  . Tear of medial meniscus of left knee 09/08/2013    Past Surgical History:  Procedure Laterality Date  . APPENDECTOMY    . BACK SURGERY  2010   lumb fusion  . COLONOSCOPY    . EYE SURGERY     both cataracts  . KNEE ARTHROSCOPY WITH MEDIAL MENISECTOMY Left 09/08/2013   Procedure: LEFT KNEE ARTHROSCOPY WITH PARTIAL MEDIAL MENISCECTOMY;  Surgeon: [name], MD;  Location: [facility];  Service: Orthopedics;  Laterality: Left;  . LUMBAR LAMINECTOMY/DECOMPRESSION MICRODISCECTOMY  05/13/2012   Procedure: LUMBAR LAMINECTOMY/DECOMPRESSION MICRODISCECTOMY 1 LEVEL;  Surgeon: [name], MD;  Location: [place] [facility];  Service: Neurosurgery;  Laterality:  Bilateral;  Bilateral Lumbar one -two Decompressive Laminectomy  . PARTIAL KNEE ARTHROPLASTY Left 04/27/2014   Procedure: LEFT UNICOMPARTMENTAL KNEE;  Surgeon: [name], MD;  Location: [facility];  Service: Orthopedics;  Laterality: Left;  . UPPER GI ENDOSCOPY      There were no vitals filed for this visit.   Subjective Assessment - 07/02/20 1016    Subjective Pt thinks she is improving, less pain .    Currently in Pain? Yes    Pain Location Back    Pain Orientation Right;Left    Pain Descriptors / Indicators Aching    Pain Type Acute pain    Pain Onset More than a month ago    Pain Frequency Intermittent    Pain Score 5    Pain Location Knee    Pain Orientation Right;Left    Pain Descriptors / Indicators Aching    Pain Type Chronic pain    Pain Onset More than a month ago    Pain Frequency Intermittent                             [facility] Adult PT Treatment/Exercise - 07/02/20 0951      Exercises   Exercises Knee/Hip      Knee/Hip Exercises: Stretches  Active Hamstring Stretch 2 reps;30 seconds    Active Hamstring Stretch Limitations seated    Other Knee/Hip Stretches DKTC, 30 sec x 3 bil;      Other Knee/Hip Stretches --      Knee/Hip Exercises: Aerobic   Recumbent Bike L2  x 8 min      Knee/Hip Exercises: Standing   Hip Abduction --    Hip Extension --    Forward Step Up --    Other Standing Knee Exercises Row GTB x 20;      Knee/Hip Exercises: Seated   Long Arc Quad 20 reps;Both    Long Arc Quad Limitations 3 lb      Knee/Hip Exercises: Supine   Bridges with Clamshell 20 reps    Straight Leg Raises 2 sets;10 reps;Both    Straight Leg Raises Limitations with tA    Other Supine Knee/Hip Exercises Supineclam  with BlTB x 20, with TA      Manual Therapy   Manual Therapy Joint mobilization;Soft tissue mobilization;Passive ROM;Manual Traction    Soft tissue mobilization --    Manual Traction Long leg distraction  for  lumbar pump x 3 min R                  PT Education - 07/02/20 0953    Education Details --    Person(s) Educated --    Methods --    Comprehension --            PT Short Term Goals - 07/02/20 0954      PT SHORT TERM GOAL #1   Title Pt to be independent with initial HEP    Time 2    Period Weeks    Status Achieved    Target Date 06/14/20             PT Long Term Goals - 07/02/20 0954      PT LONG TERM GOAL #1   Title Pt to be independent with final HEP    Time 8    Period Weeks    Status Achieved      PT LONG TERM GOAL #2   Title Pt to report decreased pain in lumbar spine and LE to 0-2/10 in am, and with activity at end of the day    Time 8    Period Weeks    Status Partially Met      PT LONG TERM GOAL #3   Title Pt to report decreased pain in R knee, to 0-3/10 with standing, walking, and IADLS.    Time 8    Period Weeks    Status Achieved      PT LONG TERM GOAL #4   Title Pt to demo improved strength of hips and knees to at least 4+/5 to improve stability , ambulation and pain,    Time 8    Period Weeks    Status Achieved                 Plan - 07/02/20 1017    Clinical Impression Statement Pt progressing well with strengthening. Final HEp issued today, will review in detail next visit, d/c next visit. Pt Still reporting 5/10 pain, but thinks she is doing very well, feels "tired" in afternoons but having less pain.    Personal Factors and Comorbidities Comorbidity 1;Time since onset of injury/illness/exacerbation    Comorbidities several previous back surgeries and injections in back and knee, Knee OA.    Examination-Activity Limitations Stand;Locomotion  Level;Stairs    Examination-Participation Restrictions Cleaning;Meal Prep;Yard Work;Community Activity;Laundry;Shop    Stability/Clinical Decision Making Evolving/Moderate complexity    Rehab Potential Good    PT Frequency 2x / week    PT Duration 6 weeks    PT Treatment/Interventions  ADLs/Self Care Home Management;Cryotherapy;Electrical Stimulation;Iontophoresis 72m/ml Dexamethasone;Moist Heat;Traction;Ultrasound;Therapeutic exercise;Therapeutic activities;Functional mobility training;Stair training;Gait training;Balance training;Neuromuscular re-education;Patient/family education;Manual techniques;Vasopneumatic Device;Taping;Dry needling;Passive range of motion;Visual/perceptual remediation/compensation;Spinal Manipulations;Joint Manipulations;DME Instruction    Consulted and Agree with Plan of Care Patient           Patient will benefit from skilled therapeutic intervention in order to improve the following deficits and impairments:  Abnormal gait,Pain,Increased muscle spasms,Decreased scar mobility,Decreased mobility,Decreased activity tolerance,Decreased endurance,Decreased range of motion,Decreased strength,Difficulty walking,Decreased balance  Visit Diagnosis: Chronic bilateral low back pain with right-sided sciatica  Pain in right hip  Chronic pain of right knee     Problem List Patient Active Problem List   Diagnosis Date Noted  . Trochanteric bursitis, right hip 04/24/2020  . Essential hypertension 01/12/2020  . Sinus bradycardia 01/12/2020  . Dyspnea on exertion 01/12/2020  . Lumbar stenosis with neurogenic claudication 02/01/2017  . Osteoarthritis of left knee 04/27/2014  . Tear of medial meniscus of left knee 09/08/2013    LLyndee Hensen PT, DPT 10:18 AM  07/02/20    [facility] [address]Phone: [phone]  Fax:  [phone] Name: CCLEONA DOUBLEDAYMRN: [id number] of Birth: [birth year]

## 2020-07-04 ENCOUNTER — Other Ambulatory Visit: Payer: Self-pay

## 2020-07-04 ENCOUNTER — Encounter: Payer: Self-pay | Admitting: Physical Therapy

## 2020-07-04 ENCOUNTER — Ambulatory Visit (INDEPENDENT_AMBULATORY_CARE_PROVIDER_SITE_OTHER): Payer: Medicare Other | Admitting: Physical Therapy

## 2020-07-04 DIAGNOSIS — M5441 Lumbago with sciatica, right side: Secondary | ICD-10-CM | POA: Diagnosis not present

## 2020-07-04 DIAGNOSIS — G8929 Other chronic pain: Secondary | ICD-10-CM

## 2020-07-04 DIAGNOSIS — M25551 Pain in right hip: Secondary | ICD-10-CM | POA: Diagnosis not present

## 2020-07-04 DIAGNOSIS — M25561 Pain in right knee: Secondary | ICD-10-CM | POA: Diagnosis not present

## 2020-07-04 NOTE — Patient Instructions (Signed)
Access Code: [email or web address] URL: [email or web address] Date: 07/04/2020 Prepared by: Lyndee Hensen  Exercises Supine Single Knee to Chest Stretch - 2 x daily - 3 reps - 30 hold Supine Piriformis Stretch Pulling Heel to Hip - 2 x daily - 3 reps - 30 hold Seated Flexion Stretch - 2 x daily - 3 reps - 30 hold Seated Hamstring Stretch - 2 x daily - 3 reps - 30 hold Hooklying Clamshell with Resistance - 1 x daily - 2 sets - 10 reps Supine Bridge - 1 x daily - 2 sets - 10 reps Sidelying Hip Abduction - 1 x daily - 2 sets - 10 reps Straight Leg Raise - 1 x daily - 1 sets - 10 reps Seated Knee Extension AROM - 2 x daily - 2 sets - 10 reps Standing Row with Anchored Resistance - 1 x daily - 2 sets - 10 reps

## 2020-07-04 NOTE — Therapy (Signed)
[facility] [address] [place], [place], [address] Phone: [phone]   Fax:  [phone]  Physical Therapy Treatment/Discharge   Patient Details  Name: Melissa Maldonado MRN: [id number] Date of Birth: Sep 19, 1940 Referring Provider (PT): [name]   Encounter Date: 07/04/2020   PT End of Session - 07/04/20 0954    Visit Number 10    Number of Visits 16    Date for PT Re-Evaluation 07/26/20    Authorization Type Medicare    Authorization Time Period 8 weeks    PT Start Time 0930    PT Stop Time 1015    PT Time Calculation (min) 45 min    Activity Tolerance Patient tolerated treatment well    Behavior During Therapy [facility] for tasks assessed/performed           Past Medical History:  Diagnosis Date  . Arthritis   . Chronic back pain    lumbar stenosis  . Early cataracts, bilateral   . GERD (gastroesophageal reflux disease)    occasionally but no meds required  . Headache(784.0)    occasionally  . History of blood transfusion   . History of colon polyps   . Hyperlipidemia    takes Lipitor every other day  . Hypertension    takes losartan daily  . Osteoarthritis of left knee 04/27/2014  . Shortness of breath    with exertion  . SOB (shortness of breath) 2013   CPET  . Tear of medial meniscus of left knee 09/08/2013    Past Surgical History:  Procedure Laterality Date  . APPENDECTOMY    . BACK SURGERY  2010   lumb fusion  . COLONOSCOPY    . EYE SURGERY     both cataracts  . KNEE ARTHROSCOPY WITH MEDIAL MENISECTOMY Left 09/08/2013   Procedure: LEFT KNEE ARTHROSCOPY WITH PARTIAL MEDIAL MENISCECTOMY;  Surgeon: [name], MD;  Location: [facility];  Service: Orthopedics;  Laterality: Left;  . LUMBAR LAMINECTOMY/DECOMPRESSION MICRODISCECTOMY  05/13/2012   Procedure: LUMBAR LAMINECTOMY/DECOMPRESSION MICRODISCECTOMY 1 LEVEL;  Surgeon: [name], MD;  Location: [place] [facility];  Service: Neurosurgery;   Laterality: Bilateral;  Bilateral Lumbar one -two Decompressive Laminectomy  . PARTIAL KNEE ARTHROPLASTY Left 04/27/2014   Procedure: LEFT UNICOMPARTMENTAL KNEE;  Surgeon: [name], MD;  Location: [place] [facility];  Service: Orthopedics;  Laterality: Left;  . UPPER GI ENDOSCOPY      There were no vitals filed for this visit.   Subjective Assessment - 07/04/20 0947    Subjective Pt with no new complaints.    Currently in Pain? Yes    Pain Location Back    Pain Orientation Right;Left    Pain Descriptors / Indicators Aching    Pain Type Acute pain    Pain Onset More than a month ago    Pain Frequency Intermittent    Pain Score 3    Pain Location Knee    Pain Orientation Right;Left    Pain Descriptors / Indicators Aching    Pain Type Chronic pain    Pain Onset More than a month ago    Pain Frequency Intermittent              [facility] PT Assessment - 07/04/20 0001      AROM   Lumbar Flexion WFL                         [facility] Adult PT  Treatment/Exercise - 07/04/20 0001      Exercises   Exercises Knee/Hip      Knee/Hip Exercises: Stretches   Active Hamstring Stretch 2 reps;30 seconds    Active Hamstring Stretch Limitations seated    Other Knee/Hip Stretches DKTC, 30 sec x 3 bil;      Other Knee/Hip Stretches seated fwd flexion stretch 30 sec x 3;       Knee/Hip Exercises: Aerobic   Recumbent Bike L2  x 8 min      Knee/Hip Exercises: Standing   Hip Flexion 20 reps    Hip Abduction 20 reps    Forward Step Up 10 reps;Both;Step Height: 6";Hand Hold: 0    Stairs up/down 5 steps with no HR x 5;    Other Standing Knee Exercises Row GTB x 20;      Knee/Hip Exercises: Seated   Long Arc Quad 20 reps;Both    Long Arc Quad Limitations 3 lb      Knee/Hip Exercises: Supine   Bridges with Clamshell 20 reps    Straight Leg Raises 2 sets;10 reps;Both    Straight Leg Raises Limitations with tA    Other Supine Knee/Hip Exercises Supineclam  with BlTB x  20, with TA      Manual Therapy   Manual Therapy Joint mobilization;Soft tissue mobilization;Passive ROM;Manual Traction    Soft tissue mobilization STM/DTM to R lumbar, SI. and glute med    Manual Traction Long leg distraction  for lumbar pump x 3 min R                  PT Education - 07/04/20 0948    Education Details Final HEP reviewed    Person(s) Educated Patient    Methods Explanation;Demonstration;Verbal cues;Handout    Comprehension Verbalized understanding;Returned demonstration;Verbal cues required;Tactile cues required            PT Short Term Goals - 07/02/20 0954      PT SHORT TERM GOAL #1   Title Pt to be independent with initial HEP    Time 2    Period Weeks    Status Achieved    Target Date 06/14/20             PT Long Term Goals - 07/04/20 1011      PT LONG TERM GOAL #1   Title Pt to be independent with final HEP    Time 8    Period Weeks    Status Achieved      PT LONG TERM GOAL #2   Title Pt to report decreased pain in lumbar spine and LE to 0-2/10 in am, and with activity at end of the day    Time 8    Period Weeks    Status Partially Met      PT LONG TERM GOAL #3   Title Pt to report decreased pain in R knee, to 0-3/10 with standing, walking, and IADLS.    Time 8    Period Weeks    Status Achieved      PT LONG TERM GOAL #4   Title Pt to demo improved strength of hips and knees to at least 4+/5 to improve stability , ambulation and pain,    Time 8    Period Weeks    Status Achieved                 Plan - 07/04/20 1024    Clinical Impression Statement Pt has made good progress wtih  pain and strengthening. She has met most goals at this time. She continues to have mild soreness in back and knees later in day. Overall , reports "much less pain, and just tired in evenings", but still reports 5/10 pain scale. Pt happy with progress. Final HEP reviewed in detail, discussed importance of continuing mobility and strengthening.  Pt ready for d/c, at this time.    Personal Factors and Comorbidities Comorbidity 1;Time since onset of injury/illness/exacerbation    Comorbidities several previous back surgeries and injections in back and knee, Knee OA.    Examination-Activity Limitations Stand;Locomotion Level;Stairs    Examination-Participation Restrictions Cleaning;Meal Prep;Yard Work;Community Activity;Laundry;Shop    Stability/Clinical Decision Making Evolving/Moderate complexity    Rehab Potential Good    PT Frequency 2x / week    PT Duration 6 weeks    PT Treatment/Interventions ADLs/Self Care Home Management;Cryotherapy;Electrical Stimulation;Iontophoresis 49m/ml Dexamethasone;Moist Heat;Traction;Ultrasound;Therapeutic exercise;Therapeutic activities;Functional mobility training;Stair training;Gait training;Balance training;Neuromuscular re-education;Patient/family education;Manual techniques;Vasopneumatic Device;Taping;Dry needling;Passive range of motion;Visual/perceptual remediation/compensation;Spinal Manipulations;Joint Manipulations;DME Instruction    Consulted and Agree with Plan of Care Patient           Patient will benefit from skilled therapeutic intervention in order to improve the following deficits and impairments:  Abnormal gait,Pain,Increased muscle spasms,Decreased scar mobility,Decreased mobility,Decreased activity tolerance,Decreased endurance,Decreased range of motion,Decreased strength,Difficulty walking,Decreased balance  Visit Diagnosis: Chronic bilateral low back pain with right-sided sciatica  Pain in right hip  Chronic pain of right knee     Problem List Patient Active Problem List   Diagnosis Date Noted  . Trochanteric bursitis, right hip 04/24/2020  . Essential hypertension 01/12/2020  . Sinus bradycardia 01/12/2020  . Dyspnea on exertion 01/12/2020  . Lumbar stenosis with neurogenic claudication 02/01/2017  . Osteoarthritis of left knee 04/27/2014  . Tear of medial meniscus  of left knee 09/08/2013   LLyndee Hensen PT, DPT 9:33 PM  07/04/20    [facility] [address] [address]Phone: [phone]  Fax:  [phone] Name: CDINAH LUPAMRN: [id number] of Birth: 121-Jan-1942  PHYSICAL THERAPY DISCHARGE SUMMARY  Visits from Start of Care: 10 Plan: Patient agrees to discharge.  Patient goals were met. Patient is being discharged due to meeting the stated rehab goals.  ?????    LLyndee Hensen PT, DPT 9:33 PM  07/04/20

## 2020-07-09 ENCOUNTER — Encounter: Payer: Medicare Other | Admitting: Physical Therapy

## 2020-07-11 ENCOUNTER — Encounter: Payer: Medicare Other | Admitting: Physical Therapy

## 2020-07-16 ENCOUNTER — Encounter: Payer: Medicare Other | Admitting: Physical Therapy

## 2020-07-18 ENCOUNTER — Encounter: Payer: Medicare Other | Admitting: Physical Therapy

## 2020-08-28 DIAGNOSIS — M2550 Pain in unspecified joint: Secondary | ICD-10-CM | POA: Diagnosis not present

## 2020-08-28 DIAGNOSIS — M65331 Trigger finger, right middle finger: Secondary | ICD-10-CM | POA: Diagnosis not present

## 2020-08-28 DIAGNOSIS — M792 Neuralgia and neuritis, unspecified: Secondary | ICD-10-CM | POA: Diagnosis not present

## 2020-08-28 DIAGNOSIS — G5763 Lesion of plantar nerve, bilateral lower limbs: Secondary | ICD-10-CM | POA: Diagnosis not present

## 2020-08-28 DIAGNOSIS — L603 Nail dystrophy: Secondary | ICD-10-CM | POA: Diagnosis not present

## 2020-08-28 DIAGNOSIS — Z79899 Other long term (current) drug therapy: Secondary | ICD-10-CM | POA: Diagnosis not present

## 2020-08-28 DIAGNOSIS — M0609 Rheumatoid arthritis without rheumatoid factor, multiple sites: Secondary | ICD-10-CM | POA: Diagnosis not present

## 2020-09-09 ENCOUNTER — Encounter: Payer: Self-pay | Admitting: Podiatry

## 2020-09-09 ENCOUNTER — Other Ambulatory Visit: Payer: Self-pay

## 2020-09-09 ENCOUNTER — Ambulatory Visit (INDEPENDENT_AMBULATORY_CARE_PROVIDER_SITE_OTHER): Payer: Medicare Other

## 2020-09-09 ENCOUNTER — Ambulatory Visit (INDEPENDENT_AMBULATORY_CARE_PROVIDER_SITE_OTHER): Payer: Medicare Other | Admitting: Podiatry

## 2020-09-09 DIAGNOSIS — M21619 Bunion of unspecified foot: Secondary | ICD-10-CM

## 2020-09-09 DIAGNOSIS — M779 Enthesopathy, unspecified: Secondary | ICD-10-CM

## 2020-09-09 DIAGNOSIS — G629 Polyneuropathy, unspecified: Secondary | ICD-10-CM | POA: Diagnosis not present

## 2020-09-09 MED ORDER — TRIAMCINOLONE ACETONIDE 10 MG/ML IJ SUSP
10.0000 mg | Freq: Once | INTRAMUSCULAR | Status: AC
  Administered 2020-09-09: 10 mg

## 2020-09-09 NOTE — Progress Notes (Signed)
Subjective:   Patient ID: Melissa Maldonado, female   DOB: 80 y.o.   MRN: [id number]   HPI Patient presents stating that she has developed bunion deformity right over left and also pain in the joint right over left of the big toe.  States that it has gradually gotten worse and she is also concerned about some numbness in her feet and does have history of back issues.  Patient does not smoke likes to be active and has not had any balance or falling issues at this time   Review of Systems  All other systems reviewed and are negative.       Objective:  Physical Exam Vitals and nursing note reviewed.  Constitutional:      Appearance: She is well-developed and well-nourished.  Cardiovascular:     Pulses: Intact distal pulses.  Pulmonary:     Effort: Pulmonary effort is normal.  Musculoskeletal:        General: Normal range of motion.  Skin:    General: Skin is warm.  Neurological:     Mental Status: She is alert.     Neurovascular status intact muscle strength found to be adequate range of motion adequate.  Patient is noted to have no loss sharp dull vibratory and pain that is around the first MPJ right over left with redness around the first metatarsal head with reduced range of motion and moderate flatfoot deformity.  Patient has good digital perfusion well oriented x3     Assessment:  HAV deformity right over left with moderate hallux limitus deformity also noted with inflammatory capsulitis around the first MPJ right over left     Plan:  H&P reviewed conditions and I do think any numbness is probably related to back issues and should not be a long-term problem and I discussed structural deformity and I did discuss at 1 point correction of bunion and hallux limitus deformity right.  At this point I did go ahead did sterile prep and injected the capsule 3 mg dexamethasone Kenalog 5 mg Xylocaine and I want to see back 4 weeks see results decide whether or not surgical intervention will  be necessary.  We will also evaluated whether any numbness is gotten worse  X-rays indicate structural bunion deformity right over left with spurring of the dorsal first metatarsal head right over left

## 2020-10-07 ENCOUNTER — Encounter: Payer: Self-pay | Admitting: Podiatry

## 2020-10-07 ENCOUNTER — Other Ambulatory Visit: Payer: Self-pay

## 2020-10-07 ENCOUNTER — Ambulatory Visit (INDEPENDENT_AMBULATORY_CARE_PROVIDER_SITE_OTHER): Payer: Medicare Other | Admitting: Podiatry

## 2020-10-07 DIAGNOSIS — M779 Enthesopathy, unspecified: Secondary | ICD-10-CM

## 2020-10-07 DIAGNOSIS — M21619 Bunion of unspecified foot: Secondary | ICD-10-CM

## 2020-10-07 DIAGNOSIS — G629 Polyneuropathy, unspecified: Secondary | ICD-10-CM

## 2020-10-07 NOTE — Progress Notes (Signed)
Subjective:   Patient ID: Melissa Maldonado, female   DOB: 80 y.o.   MRN: [id number]   HPI Patient states the pain has improved but she still getting a lot of numbing pain and states that she is talked to her physician about increasing her gabapentin but wanted to ask me neuro   ROS      Objective:  Physical Exam  Vascular status unchanged with patient found to have diminished discomfort in both her feet with diminished irritation with shoe gear modifications over-the-counter insoles but does still complain quite a bit of numbness      Assessment:  Improvement in inflammation with numbness-like symptomatology     Plan:  H&P reviewed condition with patient.  At this point I have recommended slow increase in gabapentin usage and we reviewed what would be best from that standpoint and I explained that if any balance issues were to occur or other pathology we may have to treat this differently.  At this point we are going to let her go and hopefully she will be stable with her symptoms and will be seen back as needed

## 2020-10-17 DIAGNOSIS — M150 Primary generalized (osteo)arthritis: Secondary | ICD-10-CM | POA: Diagnosis not present

## 2020-10-17 DIAGNOSIS — R5383 Other fatigue: Secondary | ICD-10-CM | POA: Diagnosis not present

## 2020-10-17 DIAGNOSIS — M5136 Other intervertebral disc degeneration, lumbar region: Secondary | ICD-10-CM | POA: Diagnosis not present

## 2020-10-17 DIAGNOSIS — E669 Obesity, unspecified: Secondary | ICD-10-CM | POA: Diagnosis not present

## 2020-10-17 DIAGNOSIS — Z6830 Body mass index (BMI) 30.0-30.9, adult: Secondary | ICD-10-CM | POA: Diagnosis not present

## 2020-10-17 DIAGNOSIS — Z111 Encounter for screening for respiratory tuberculosis: Secondary | ICD-10-CM | POA: Diagnosis not present

## 2020-10-17 DIAGNOSIS — M2550 Pain in unspecified joint: Secondary | ICD-10-CM | POA: Diagnosis not present

## 2020-10-18 ENCOUNTER — Telehealth: Payer: Self-pay

## 2020-10-18 NOTE — Telephone Encounter (Signed)
Called pt to discuss VV. Per chart notes scheduler attempted to set pt up for VV instead of OV. Pt states that she is not interested and would prefer to come to the office. Reviewed that pt is scheduled to see Dr. Gwenlyn Found on Tuesday, April 5th at 8:30am. Pt verbalizes understanding.

## 2020-10-21 ENCOUNTER — Ambulatory Visit (INDEPENDENT_AMBULATORY_CARE_PROVIDER_SITE_OTHER): Payer: Medicare Other | Admitting: Family Medicine

## 2020-10-21 ENCOUNTER — Ambulatory Visit: Payer: Self-pay

## 2020-10-21 ENCOUNTER — Ambulatory Visit (INDEPENDENT_AMBULATORY_CARE_PROVIDER_SITE_OTHER): Payer: Medicare Other

## 2020-10-21 ENCOUNTER — Other Ambulatory Visit: Payer: Self-pay

## 2020-10-21 VITALS — BP 130/78 | HR 45 | Ht 65.0 in | Wt 191.4 lb

## 2020-10-21 DIAGNOSIS — G8929 Other chronic pain: Secondary | ICD-10-CM

## 2020-10-21 DIAGNOSIS — M25511 Pain in right shoulder: Secondary | ICD-10-CM

## 2020-10-21 NOTE — Progress Notes (Signed)
I, Peterson Lombard, LAT, ATC acting as a scribe for [name], MD.  Melissa Maldonado is a 80 y.o. female who presents to [facility] today for R shoulder pain ongoing for about a month. Pt was last seen by [name] on 06/24/20 for chronic LBPn and R knee pain and was advised to cont PT. Today, pt locates pain to anterior and lateral aspect of R shoulder w/ radiating pain down to elbow. Pt cannot recall any specific MOI. Pt reports she just ran out of her Tramadol rx yesterday.  Neck pain: yes- R side UE numbness/tinling: no, but reports a "chill" Aggravates: getting dress, shoulder flex, ER, ext, and any motion above 90 Treatments tried: cream, Tylenol arthritis   Pertinent review of systems: No fevers or chills  Relevant historical information: Hypertension   Exam:  BP 130/78 (BP Location: Left Arm, Patient Position: Sitting, Cuff Size: Normal)   Pulse (!) 45   Ht 5\' 5"  (1.651 m)   Wt 191 lb 6.4 oz (86.8 kg)   SpO2 98%   BMI 31.85 kg/m  General: Well Developed, well nourished, and in no acute distress.   MSK: Right shoulder normal-appearing Nontender. Range of motion abduction 90 degrees external rotation full internal rotation posterior hip Strength 4/5 abduction external rotation 5/5 internal rotation. Guarding during impingement testing nondiagnostic. Pulses capillary refill and sensation are intact distally.         Lab and Radiology Results  X-ray images right shoulder obtained today personally and independently interpreted Severe AC DJD.  Moderate glenohumeral DJD.  No acute fractures. Await formal radiology review   Procedure: Real-time Ultrasound Guided Injection of right shoulder subacromial bursa Device: Philips Affiniti 50G Images permanently stored and available for review in PACS Evaluation with ultrasound prior to injection reveals enlarged biceps tendon and bicipital groove.  Normal-appearing subscapularis  tendon. Supraspinatus tendon with some hypoechoic change mid substance tendon. Infraspinatus tendon some hypoechoic change mid substance tendon. Moderate subacromial bursitis. AC DJD Impression: Subacromial bursitis and concern for intersubstance rotator cuff tear. Verbal informed consent obtained.  Discussed risks and benefits of procedure. Warned about infection bleeding damage to structures skin hypopigmentation and fat atrophy among others. Patient expresses understanding and agreement Time-out conducted.   Noted no overlying erythema, induration, or other signs of local infection.   Skin prepped in a sterile fashion.   Local anesthesia: Topical Ethyl chloride.   With sterile technique and under real time ultrasound guidance:  40 mg of Kenalog and 2 mL of Marcaine injected into subacromial bursa. Fluid seen entering the bursa.   Completed without difficulty   Pain moderately resolved suggesting accurate placement of the medication.   Advised to call if fevers/chills, erythema, induration, drainage, or persistent bleeding.   Images permanently stored and available for review in the ultrasound unit.  Impression: Technically successful ultrasound guided injection.    Assessment and Plan: 80 y.o. female with right shoulder pain thought to be due to rotator cuff tendinopathy with possible interstitial partial rotator cuff tears.  Additionally patient has DJD of the [facility] joint and glenohumeral joint which may be contributory.  Plan for subacromial injection and physical therapy.  Reassess in 6 weeks.   PDMP not reviewed this encounter. Orders Placed This Encounter  Procedures  . [place] LIMITED JOINT SPACE STRUCTURES UP RIGHT(NO LINKED CHARGES)    Standing Status:   Future    Number of Occurrences:   1    Standing Expiration Date:  04/22/2021    Order Specific Question:   Reason for Exam (SYMPTOM  OR DIAGNOSIS REQUIRED)    Answer:   right shoulder pain    Order Specific Question:   Preferred  imaging location?    Answer:   [place]  . DG Shoulder Right    Standing Status:   Future    Number of Occurrences:   1    Standing Expiration Date:   10/21/2021    Order Specific Question:   Reason for Exam (SYMPTOM  OR DIAGNOSIS REQUIRED)    Answer:   eval shoudler pain    Order Specific Question:   Preferred imaging location?    Answer:   [facility]  . Ambulatory referral to Physical Therapy    Referral Priority:   Routine    Referral Type:   Physical Medicine    Referral Reason:   Specialty Services Required    Requested Specialty:   Physical Therapy   No orders of the defined types were placed in this encounter.    Discussed warning signs or symptoms. Please see discharge instructions. Patient expresses understanding.   The above documentation has been reviewed and is accurate and complete [name], M.D.

## 2020-10-21 NOTE — Patient Instructions (Signed)
Thank you for coming in today.  Call or go to the ER if you develop a large red swollen joint with extreme pain or oozing puss.   I've referred you to Physical Therapy.  Let us know if you don't hear from them in one week.  Please get an Xray today before you leave  Return in 6 weeks.  Let me know sooner if you have a problem.

## 2020-10-22 ENCOUNTER — Encounter: Payer: Self-pay | Admitting: Cardiovascular Disease

## 2020-10-22 ENCOUNTER — Ambulatory Visit (INDEPENDENT_AMBULATORY_CARE_PROVIDER_SITE_OTHER): Payer: Medicare Other | Admitting: Cardiovascular Disease

## 2020-10-22 ENCOUNTER — Other Ambulatory Visit: Payer: Self-pay

## 2020-10-22 DIAGNOSIS — I10 Essential (primary) hypertension: Secondary | ICD-10-CM

## 2020-10-22 DIAGNOSIS — R0609 Other forms of dyspnea: Secondary | ICD-10-CM

## 2020-10-22 DIAGNOSIS — R0600 Dyspnea, unspecified: Secondary | ICD-10-CM

## 2020-10-22 NOTE — Progress Notes (Signed)
   10/22/2020 Melissa Maldonado   10/06/1940  [id number]  Primary Physician Pharr, Walter, MD Primary Cardiologist:  J  MD FACP, FACC, FAHA, FSCAI  HPI:  Melissa Maldonado is a 80 y.o.  moderately overweight married African-American female mother of 2 children, grandmother of 1 grandchild who is retired from working in the [employer].She is referred to me by Dr. Pharr, her PCP, for evaluation of progressive dyspnea on exertion.  I last saw her in the office 02/27/2020.I apparently saw her many years ago and did a METtest which was abnormal. She does have treated hypertension. There is no family history of heart disease. She is never had a heart attack or stroke. She denies chest pain. She is noticed increasing dyspnea on exertion for last 6 months especially walking up stairs although she does also admit to having gained some weight as well.   A 2D echo performed 02/07/2020 that showed normal LV systolic function with grade 2 diastolic dysfunction and mild pulmonary hypertension.  A coronary calcium score was 0 suggesting no evidence of CAD.    Since I saw her 6 months ago her dyspnea has significantly improved.  She no longer complains of this.  She denies chest pain.  Her most recent lipid profile performed 04/10/2020 revealed total cholesterol of 177, LDL of 110 HDL 46, acceptable for primary prevention.  Current Meds  Medication Sig  . ACETAMINOPHEN 8 HOUR PO Take 1 tablet by mouth.   . cholecalciferol (VITAMIN D3) 25 MCG (1000 UNIT) tablet Take 1,000 Units by mouth daily.  . Diphenhydramine-APAP (ACETA-GESIC) 12.5-325 MG TABS Take by mouth.   . Fluocinolone Acetonide 0.01 % OIL SMARTSIG:2 Drop(s) In Ear(s) Every Night PRN  . leflunomide (ARAVA) 20 MG tablet Take 20 mg by mouth daily.   . rOPINIRole (REQUIP) 0.5 MG tablet Take 0.5 mg by mouth at bedtime.  . telmisartan-hydrochlorothiazide (MICARDIS HCT) 40-12.5 MG tablet Take 1 tablet by mouth daily.   . traMADol (ULTRAM) 50 MG tablet Take by mouth 2 (two) times daily.     Allergies  Allergen Reactions  . Aspirin Other (See Comments)    "knocked out"  . Codeine Nausea Only    Social History   Socioeconomic History  . Marital status: Married    Spouse name: Not on file  . Number of children: Not on file  . Years of education: Not on file  . Highest education level: Not on file  Occupational History  . Not on file  Tobacco Use  . Smoking status: Never Smoker  . Smokeless tobacco: Never Used  Substance and Sexual Activity  . Alcohol use: Yes    Comment: socially wine  . Drug use: No  . Sexual activity: Yes    Birth control/protection: Post-menopausal  Other Topics Concern  . Not on file  Social History Narrative  . Not on file   Social Determinants of Health   Financial Resource Strain: Not on file  Food Insecurity: Not on file  Transportation Needs: Not on file  Physical Activity: Not on file  Stress: Not on file  Social Connections: Not on file  Intimate Partner Violence: Not on file     Review of Systems: General: negative for chills, fever, night sweats or weight changes.  Cardiovascular: negative for chest pain, dyspnea on exertion, edema, orthopnea, palpitations, paroxysmal nocturnal dyspnea or shortness of breath Dermatological: negative for rash Respiratory: negative for cough or wheezing Urologic: negative for hematuria Abdominal: negative for   nausea, vomiting, diarrhea, bright red blood per rectum, melena, or hematemesis Neurologic: negative for visual changes, syncope, or dizziness All other systems reviewed and are otherwise negative except as noted above.    Blood pressure (!) 162/74, pulse (!) 58, height 5' 5" (1.651 m), weight 188 lb 6.4 oz (85.5 kg).  General appearance: alert and no distress Neck: no adenopathy, no carotid bruit, no JVD, supple, symmetrical, trachea midline and thyroid not enlarged, symmetric, no  tenderness/mass/nodules Lungs: clear to auscultation bilaterally Heart: regular rate and rhythm, S1, S2 normal, no murmur, click, rub or gallop Extremities: extremities normal, atraumatic, no cyanosis or edema Pulses: 2+ and symmetric Skin: Skin color, texture, turgor normal. No rashes or lesions Neurologic: Alert and oriented X 3, normal strength and tone. Normal symmetric reflexes. Normal coordination and gait  EKG sinus bradycardia 58 without ST or T wave changes.  I personally reviewed this EKG.  ASSESSMENT AND PLAN:   Essential hypertension History of essential hypertension a blood pressure measured today 162/74.  She is on Micardis/hydrochlorothiazide.  Dyspnea on exertion History of dyspnea on exertion in the past with normal 2D echo, grade 2 diastolic dysfunction and a coronary calcium score of 0.  Dyspnea is no longer an issue with her currently.       J.  MD FACP,FACC,FAHA, FSCAI 10/22/2020 9:08 AM 

## 2020-10-22 NOTE — Assessment & Plan Note (Signed)
History of essential hypertension a blood pressure measured today 162/74.  She is on Micardis/hydrochlorothiazide.

## 2020-10-22 NOTE — Patient Instructions (Signed)
Medication Instructions:  Your physician recommends that you continue on your current medications as directed. Please refer to the Current Medication list given to you today.  *If you need a refill on your cardiac medications before your next appointment, please call your pharmacy*   Follow-Up: At [facility], you and your health needs are our priority.  As part of our continuing mission to provide you with exceptional heart care, we have created designated Provider Care Teams.  These Care Teams include your primary Cardiologist (physician) and Advanced Practice Providers (APPs -  Physician Assistants and Nurse Practitioners) who all work together to provide you with the care you need, when you need it.  We recommend signing up for the patient portal called "MyChart".  Sign up information is provided on this After Visit Summary.  MyChart is used to connect with patients for Virtual Visits (Telemedicine).  Patients are able to view lab/test results, encounter notes, upcoming appointments, etc.  Non-urgent messages can be sent to your provider as well.   To learn more about what you can do with MyChart, go to [email or web address].    Your next appointment:   No future appointments made at this time. We will see you on an as needed basis. If you need to schedule an appointment please call our office.  Provider:   Jonathan Berry, MD 

## 2020-10-22 NOTE — Assessment & Plan Note (Addendum)
History of dyspnea on exertion in the past with normal 2D echo, grade 2 diastolic dysfunction and a coronary calcium score of 0.  This has improved since I saw her last.

## 2020-10-23 NOTE — Progress Notes (Signed)
X-ray shoulder shows some arthritis changes and some changes of rotator cuff disease.

## 2020-10-28 ENCOUNTER — Encounter: Payer: Self-pay | Admitting: Physical Therapy

## 2020-10-28 ENCOUNTER — Other Ambulatory Visit: Payer: Self-pay

## 2020-10-28 ENCOUNTER — Ambulatory Visit: Payer: Medicare Other | Admitting: Physical Therapy

## 2020-10-28 ENCOUNTER — Ambulatory Visit (INDEPENDENT_AMBULATORY_CARE_PROVIDER_SITE_OTHER): Payer: Medicare Other | Admitting: Physical Therapy

## 2020-10-28 DIAGNOSIS — M6281 Muscle weakness (generalized): Secondary | ICD-10-CM

## 2020-10-28 DIAGNOSIS — M25511 Pain in right shoulder: Secondary | ICD-10-CM | POA: Diagnosis not present

## 2020-10-29 DIAGNOSIS — Z23 Encounter for immunization: Secondary | ICD-10-CM | POA: Diagnosis not present

## 2020-10-29 DIAGNOSIS — M150 Primary generalized (osteo)arthritis: Secondary | ICD-10-CM | POA: Diagnosis not present

## 2020-10-29 DIAGNOSIS — M2550 Pain in unspecified joint: Secondary | ICD-10-CM | POA: Diagnosis not present

## 2020-10-29 DIAGNOSIS — E669 Obesity, unspecified: Secondary | ICD-10-CM | POA: Diagnosis not present

## 2020-10-29 DIAGNOSIS — R768 Other specified abnormal immunological findings in serum: Secondary | ICD-10-CM | POA: Diagnosis not present

## 2020-10-29 DIAGNOSIS — Z6830 Body mass index (BMI) 30.0-30.9, adult: Secondary | ICD-10-CM | POA: Diagnosis not present

## 2020-10-29 DIAGNOSIS — N1832 Chronic kidney disease, stage 3b: Secondary | ICD-10-CM | POA: Diagnosis not present

## 2020-10-29 DIAGNOSIS — M5136 Other intervertebral disc degeneration, lumbar region: Secondary | ICD-10-CM | POA: Diagnosis not present

## 2020-10-29 NOTE — Therapy (Signed)
[facility] [address] [place], [place], [address] Phone: [phone]   Fax:  [phone]  Physical Therapy Evaluation  Patient Details  Name: Melissa Maldonado MRN: [id number] Date of Birth: [birth year] Referring Provider (PT): [name]   Encounter Date: 10/28/2020   PT End of Session - 10/28/20 1650    Visit Number 1    Number of Visits 12    Date for PT Re-Evaluation 12/09/20    Authorization Type Medicare    Authorization Time Period 8 weeks    PT Start Time 1430    PT Stop Time 1515    PT Time Calculation (min) 45 min    Activity Tolerance Patient tolerated treatment well    Behavior During Therapy [facility] for tasks assessed/performed           Past Medical History:  Diagnosis Date  . Arthritis   . Chronic back pain    lumbar stenosis  . Early cataracts, bilateral   . GERD (gastroesophageal reflux disease)    occasionally but no meds required  . Headache(784.0)    occasionally  . History of blood transfusion   . History of colon polyps   . Hyperlipidemia    takes Lipitor every other day  . Hypertension    takes losartan daily  . Osteoarthritis of left knee 04/27/2014  . Shortness of breath    with exertion  . SOB (shortness of breath) 2013   CPET  . Tear of medial meniscus of left knee 09/08/2013    Past Surgical History:  Procedure Laterality Date  . APPENDECTOMY    . BACK SURGERY  2010   lumb fusion  . COLONOSCOPY    . EYE SURGERY     both cataracts  . KNEE ARTHROSCOPY WITH MEDIAL MENISECTOMY Left 09/08/2013   Procedure: LEFT KNEE ARTHROSCOPY WITH PARTIAL MEDIAL MENISCECTOMY;  Surgeon: [name], MD;  Location: [place];  Service: Orthopedics;  Laterality: Left;  . LUMBAR LAMINECTOMY/DECOMPRESSION MICRODISCECTOMY  05/13/2012   Procedure: LUMBAR LAMINECTOMY/DECOMPRESSION MICRODISCECTOMY 1 LEVEL;  Surgeon: [name], MD;  Location: [place] [facility];  Service: Neurosurgery;  Laterality:  Bilateral;  Bilateral Lumbar one -two Decompressive Laminectomy  . PARTIAL KNEE ARTHROPLASTY Left 04/27/2014   Procedure: LEFT UNICOMPARTMENTAL KNEE;  Surgeon: [name], MD;  Location: [place];  Service: Orthopedics;  Laterality: Left;  . UPPER GI ENDOSCOPY      There were no vitals filed for this visit.    Subjective Assessment - 10/28/20 1631    Subjective Pt reports pain in her R anterolateral shoulder over the past few weeks. She recently had an injection in R shoulder that reduced the pain some but she still frequently has pain when elevating her R upper extremity. Pt reports pain is aggravated when lifting her pocket book. Pt is retired and enjoys gardening and exercising at [employer].    Pertinent History arthritis    Limitations Lifting;Writing;House hold activities    Diagnostic tests ultrasound revealed subacromial bursitis and possible intersubstance rotator cuff tear    Patient Stated Goals decreased pain    Currently in Pain? Yes    Pain Score 7    Pain rated as 10/10 prior to injection   Pain Location Shoulder    Pain Orientation Right    Pain Descriptors / Indicators Aching    Pain Type Acute pain    Pain Radiating Towards Pt reports pain radiates from R shoulder down to R elbow  Pain Onset 1 to 4 weeks ago    Pain Frequency Intermittent    Aggravating Factors  lifting pocket book              [facility] PT Assessment - 10/29/20 0001      Assessment   Medical Diagnosis Chronic R shoulder pain    Referring Provider (PT) [name]    Hand Dominance Right    Prior Therapy yes      Precautions   Precautions None      Balance Screen   Has the patient fallen in the past 6 months No      Prior Function   Level of Independence Independent      Cognition   Overall Cognitive Status Within Functional Limits for tasks assessed      AROM   AROM Assessment Site Shoulder    Right/Left Shoulder Right;Left    Right Shoulder Extension 40 Degrees     Right Shoulder Flexion 70 Degrees    Right Shoulder ABduction 65 Degrees    Right Shoulder Internal Rotation 40 Degrees   pain   Right Shoulder External Rotation 65 Degrees   pain   Left Shoulder Extension 70 Degrees    Left Shoulder Flexion 130 Degrees    Left Shoulder ABduction 130 Degrees    Left Shoulder Internal Rotation 55 Degrees    Left Shoulder External Rotation 60 Degrees      PROM   Overall PROM  Due to pain    PROM Assessment Site Shoulder    Right/Left Shoulder Right    Right Shoulder Flexion 95 Degrees      Strength   Strength Assessment Site Shoulder    Right/Left Shoulder Right    Right Shoulder Flexion 3-/5    Right Shoulder ABduction 3-/5    Right Shoulder Internal Rotation 3+/5    Right Shoulder External Rotation 3+/5      Palpation   Palpation comment Hypomobile R GHJ,      Special Tests   Other special tests not tested due to pain and lack of ROM.                      Objective measurements completed on examination: See above findings.       [facility] Adult PT Treatment/Exercise - 10/29/20 0001      Exercises   Exercises Shoulder      Shoulder Exercises: Supine   Flexion AAROM;Both;10 reps    Flexion Limitations Shoulder flexion with hands clasped    Other Supine Exercises Shoulder flexion with cane 10 reps      Shoulder Exercises: Seated   Retraction 10 reps    Other Seated Exercises Shoulder slides on table , x10                  PT Education - 10/28/20 1647    Education Details PT POC, initial HEP, exam findings    Person(s) Educated Patient    Methods Explanation;Demonstration;Verbal cues;Handout    Comprehension Verbalized understanding;Returned demonstration;Verbal cues required;Tactile cues required            PT Short Term Goals - 10/29/20 0756      PT SHORT TERM GOAL #1   Title Pt to be independent with initial HEP    Time 2    Period Weeks    Status New    Target Date 11/11/20              PT Long  Term Goals - 10/29/20 0757      PT LONG TERM GOAL #1   Title Pt to be independent with final HEP    Time 6    Period Weeks    Status New    Target Date 12/09/20      PT LONG TERM GOAL #2   Title Pt to report decreased pain in R shoulder to at least 2/10 when performing reaching and lifting activities    Time 6    Period Weeks    Status New    Target Date 12/09/20      PT LONG TERM GOAL #3   Title Pt will increase R shoulder ROM to [facility] for ability to perform overhead functional activities such as cleaning a mirror or reaching in a cabinet.    Time 6    Period Weeks    Status New    Target Date 12/09/20      PT LONG TERM GOAL #4   Title Pt will increase R shoulder strength to at least 4+/5 to be able to lift pocket book without pain and participate in group exercise    Time 6    Period Weeks    Status New    Target Date 12/09/20                  Plan - 10/28/20 1651    Clinical Impression Statement Pt is a 80 year old female with pain in R anterior lateral shoulder due to possible rotator cuff tear. Pt has reduced R shoulder ROM and R UE strength preventing her from being able to perform household chores and participate in physical activity without pain. PT is necessary to improve pts R shoulder ROM and strength so that she can perform functional activities with reduced pain.  Prognosis is good due to pts previous experience with PT and motivation to reduce pain.    Examination-Activity Limitations Bathing;Carry;Dressing;Lift;Reach Overhead    Examination-Participation Restrictions Cleaning;Community Activity;Driving;Laundry;Meal Prep;Yard Work    Stability/Clinical Decision Making Stable/Uncomplicated    Clinical Decision Making Low    Rehab Potential Good    PT Frequency 2x / week    PT Duration 6 weeks    PT Treatment/Interventions ADLs/Self Care Home Management;Cryotherapy;Electrical Stimulation;Iontophoresis 4mg /ml Dexamethasone;Moist  Heat;Traction;Ultrasound;Therapeutic exercise;Therapeutic activities;Functional mobility training;Stair training;Gait training;Balance training;Neuromuscular re-education;Patient/family education;Manual techniques;Vasopneumatic Device;Taping;Dry needling;Passive range of motion;Visual/perceptual remediation/compensation;Spinal Manipulations;Joint Manipulations;DME Instruction    Consulted and Agree with Plan of Care Patient           Patient will benefit from skilled therapeutic intervention in order to improve the following deficits and impairments:  Decreased range of motion,Impaired UE functional use,Pain,Decreased strength  Visit Diagnosis: Muscle weakness (generalized)  Acute pain of right shoulder     Problem List Patient Active Problem List   Diagnosis Date Noted  . Trochanteric bursitis, right hip 04/24/2020  . Essential hypertension 01/12/2020  . Sinus bradycardia 01/12/2020  . Dyspnea on exertion 01/12/2020  . Lumbar stenosis with neurogenic claudication 02/01/2017  . Osteoarthritis of left knee 04/27/2014  . Tear of medial meniscus of left knee 09/08/2013   Leighton Parody SPT 10/29/2020  [facility] [place], [place], [address] Phone: [phone]   Fax:  [phone]  Name: Melissa Maldonado MRN: [id number] Date of Birth: [birth year]   This entire session was performed under direct supervision and direction of a licensed therapist/therapist assistant . I have personally read, edited and approve of the note as written.  Ander Purpura  Kayleen Memos, PT, DPT 8:25 AM  10/29/20

## 2020-10-30 ENCOUNTER — Ambulatory Visit (INDEPENDENT_AMBULATORY_CARE_PROVIDER_SITE_OTHER): Payer: Medicare Other | Admitting: Physical Therapy

## 2020-10-30 ENCOUNTER — Encounter: Payer: Self-pay | Admitting: Physical Therapy

## 2020-10-30 ENCOUNTER — Other Ambulatory Visit: Payer: Self-pay

## 2020-10-30 DIAGNOSIS — M25511 Pain in right shoulder: Secondary | ICD-10-CM | POA: Diagnosis not present

## 2020-10-30 DIAGNOSIS — M6281 Muscle weakness (generalized): Secondary | ICD-10-CM | POA: Diagnosis not present

## 2020-10-30 NOTE — Therapy (Signed)
[facility] [address] [place], [place], [address] Phone: [phone]   Fax:  [phone]  Physical Therapy Treatment  Patient Details  Name: Melissa Maldonado MRN: [id number] Date of Birth: August 31, 1940 Referring Provider (PT): [name]   Encounter Date: 10/30/2020   PT End of Session - 10/30/20 1113    Visit Number 2    Number of Visits 12    Date for PT Re-Evaluation 12/09/20    Authorization Type Medicare    PT Start Time 1011    PT Stop Time 1044    PT Time Calculation (min) 33 min    Activity Tolerance Patient tolerated treatment well;Patient limited by pain    Behavior During Therapy [facility] for tasks assessed/performed           Past Medical History:  Diagnosis Date  . Arthritis   . Chronic back pain    lumbar stenosis  . Early cataracts, bilateral   . GERD (gastroesophageal reflux disease)    occasionally but no meds required  . Headache(784.0)    occasionally  . History of blood transfusion   . History of colon polyps   . Hyperlipidemia    takes Lipitor every other day  . Hypertension    takes losartan daily  . Osteoarthritis of left knee 04/27/2014  . Shortness of breath    with exertion  . SOB (shortness of breath) 2013   CPET  . Tear of medial meniscus of left knee 09/08/2013    Past Surgical History:  Procedure Laterality Date  . APPENDECTOMY    . BACK SURGERY  2010   lumb fusion  . COLONOSCOPY    . EYE SURGERY     both cataracts  . KNEE ARTHROSCOPY WITH MEDIAL MENISECTOMY Left 09/08/2013   Procedure: LEFT KNEE ARTHROSCOPY WITH PARTIAL MEDIAL MENISCECTOMY;  Surgeon: [name], MD;  Location: [place];  Service: Orthopedics;  Laterality: Left;  . LUMBAR LAMINECTOMY/DECOMPRESSION MICRODISCECTOMY  05/13/2012   Procedure: LUMBAR LAMINECTOMY/DECOMPRESSION MICRODISCECTOMY 1 LEVEL;  Surgeon: [name], MD;  Location: [place] [facility];  Service: Neurosurgery;  Laterality: Bilateral;   Bilateral Lumbar one -two Decompressive Laminectomy  . PARTIAL KNEE ARTHROPLASTY Left 04/27/2014   Procedure: LEFT UNICOMPARTMENTAL KNEE;  Surgeon: [name], MD;  Location: [place];  Service: Orthopedics;  Laterality: Left;  . UPPER GI ENDOSCOPY      There were no vitals filed for this visit.   Subjective Assessment - 10/30/20 1100    Subjective Pt reports soreness in R shoulder and not feeling well due to receiving her COVID booster yesterday. Pt complains of having trouble sleeping. She prefers to sleep on her side and reports trouble sleeping on both of her shoulders.    Pertinent History arthritis    Limitations Lifting;Writing;House hold activities    Diagnostic tests ultrasound revealed subacromial bursitis and possible intersubstance rotator cuff tear    Patient Stated Goals decreased pain    Currently in Pain? Yes    Pain Score 8     Pain Location Shoulder    Pain Orientation Right    Pain Descriptors / Indicators Aching;Sore    Pain Type Acute pain    Pain Onset 1 to 4 weeks ago    Pain Frequency Intermittent    Aggravating Factors  sleeping on right side    Pain Relieving Factors ice  [facility] Adult PT Treatment/Exercise - 10/30/20 0001      Exercises   Exercises Shoulder      Shoulder Exercises: Supine   Internal Rotation Right;15 reps    Internal Rotation Weight (lbs) 1 lb    Flexion AAROM;Both;10 reps    Flexion Limitations Shoulder flexion with hands clasped    Other Supine Exercises Shoulder flexion with cane 10 reps; R shoulder flexion at 90/90 x 10    Other Supine Exercises Chest press 2 lb with cane x 15; Supine ER 2lb x 15;      Shoulder Exercises: Seated   Retraction 20 reps    Other Seated Exercises --      Shoulder Exercises: Sidelying   External Rotation 15 reps    External Rotation Weight (lbs) 1      Shoulder Exercises: Pulleys   Flexion 2 minutes    ABduction 1 minute       Manual Therapy   Joint Mobilization inferior glide R shoulder    Passive ROM PROM for R shoulder flex, IR, ER    Manual Traction R shoulder distraction 10 sec x 10                    PT Short Term Goals - 10/29/20 0756      PT SHORT TERM GOAL #1   Title Pt to be independent with initial HEP    Time 2    Period Weeks    Status New    Target Date 11/11/20             PT Long Term Goals - 10/29/20 0757      PT LONG TERM GOAL #1   Title Pt to be independent with final HEP    Time 6    Period Weeks    Status New    Target Date 12/09/20      PT LONG TERM GOAL #2   Title Pt to report decreased pain in R shoulder to at least 2/10 when performing reaching and lifting activities    Time 6    Period Weeks    Status New    Target Date 12/09/20      PT LONG TERM GOAL #3   Title Pt will increase R shoulder ROM to [facility] for ability to perform overhead functional activities such as cleaning a mirror or reaching in a cabinet.    Time 6    Period Weeks    Status New    Target Date 12/09/20      PT LONG TERM GOAL #4   Title Pt will increase R shoulder strength to at least 4+/5 to be able to lift pocket book without pain and participate in group exercise    Time 6    Period Weeks    Status New    Target Date 12/09/20                 Plan - 10/30/20 1115    Clinical Impression Statement Pts PROM in R glenohumeral joint is improving in flex, IR, and ER. Pt rated her pain as an 8/10 when she arrived and reported soreness with therapeutic exercises for shoulder flexion and external rotation. She was able to perform light ther ex without increased pain.  Manual techniques were utilized to reduce hypomobility in R shoulder. Plan to continue therapuetic exercises as pain permits.    Examination-Activity Limitations Bathing;Carry;Dressing;Lift;Reach Overhead    Examination-Participation Restrictions Cleaning;Community Activity;Driving;Laundry;Meal Prep;[place] Work  Stability/Clinical Decision Making Stable/Uncomplicated    Rehab Potential Good    PT Frequency 2x / week    PT Duration 6 weeks    PT Treatment/Interventions ADLs/Self Care Home Management;Cryotherapy;Electrical Stimulation;Iontophoresis 4mg /ml Dexamethasone;Moist Heat;Traction;Ultrasound;Therapeutic exercise;Therapeutic activities;Functional mobility training;Stair training;Gait training;Balance training;Neuromuscular re-education;Patient/family education;Manual techniques;Vasopneumatic Device;Taping;Dry needling;Passive range of motion;Visual/perceptual remediation/compensation;Spinal Manipulations;Joint Manipulations;DME Instruction    PT Next Visit Plan Plan to continue pulley exercise to increase R shoulder PROM. Plan to add resistance with theraband to therapeutic exercises as pain permits.    Consulted and Agree with Plan of Care Patient           Patient will benefit from skilled therapeutic intervention in order to improve the following deficits and impairments:  Decreased range of motion,Impaired UE functional use,Pain,Decreased strength  Visit Diagnosis: Acute pain of right shoulder  Muscle weakness (generalized)     Problem List Patient Active Problem List   Diagnosis Date Noted  . Trochanteric bursitis, right hip 04/24/2020  . Essential hypertension 01/12/2020  . Sinus bradycardia 01/12/2020  . Dyspnea on exertion 01/12/2020  . Lumbar stenosis with neurogenic claudication 02/01/2017  . Osteoarthritis of left knee 04/27/2014  . Tear of medial meniscus of left knee 09/08/2013    Leighton Parody SPT 10/30/2020  [place], [place], [address] Phone: [phone]   Fax:  [phone]  Name: Melissa Maldonado MRN: [id number] Date of Birth: July 18, 1941   This entire session was performed under direct supervision and direction of a licensed therapist/therapist assistant . I have personally read, edited and  approve of the note as written.  Lyndee Hensen, PT, DPT 11:31 AM  10/30/20

## 2020-11-04 ENCOUNTER — Other Ambulatory Visit: Payer: Self-pay

## 2020-11-04 ENCOUNTER — Encounter: Payer: Self-pay | Admitting: Physical Therapy

## 2020-11-04 ENCOUNTER — Ambulatory Visit (INDEPENDENT_AMBULATORY_CARE_PROVIDER_SITE_OTHER): Payer: Medicare Other | Admitting: Physical Therapy

## 2020-11-04 DIAGNOSIS — M25511 Pain in right shoulder: Secondary | ICD-10-CM | POA: Diagnosis not present

## 2020-11-04 DIAGNOSIS — M6281 Muscle weakness (generalized): Secondary | ICD-10-CM

## 2020-11-04 NOTE — Patient Instructions (Signed)
Access Code: [email or web address] URL: [email or web address] Date: 11/04/2020 Prepared by: Lyndee Hensen  Exercises Supine Shoulder Flexion Extension AAROM with Dowel - 2 x daily - 2 sets - 10 reps Shoulder Flexion Wall Slide with Towel - 2 x daily - 2 sets - 10 reps Standing Row with Anchored Resistance - 2 x daily - 2 sets - 10 reps Shoulder External Rotation and Scapular Retraction with Resistance - 2 x daily - 2 sets - 10 reps

## 2020-11-04 NOTE — Therapy (Signed)
[facility] [address] [place], [place], [address] Phone: [phone]   Fax:  [phone]  Physical Therapy Treatment  Patient Details  Name: Melissa Maldonado MRN: [id number] Date of Birth: 03-Apr-1941 Referring Provider (PT): [name]   Encounter Date: 11/04/2020   PT End of Session - 11/04/20 1117    Visit Number 3    Number of Visits 12    Date for PT Re-Evaluation 12/09/20    Authorization Type Medicare    PT Start Time 1011    PT Stop Time 1058    PT Time Calculation (min) 47 min    Activity Tolerance Patient tolerated treatment well    Behavior During Therapy [facility] for tasks assessed/performed           Past Medical History:  Diagnosis Date  . Arthritis   . Chronic back pain    lumbar stenosis  . Early cataracts, bilateral   . GERD (gastroesophageal reflux disease)    occasionally but no meds required  . Headache(784.0)    occasionally  . History of blood transfusion   . History of colon polyps   . Hyperlipidemia    takes Lipitor every other day  . Hypertension    takes losartan daily  . Osteoarthritis of left knee 04/27/2014  . Shortness of breath    with exertion  . SOB (shortness of breath) 2013   CPET  . Tear of medial meniscus of left knee 09/08/2013    Past Surgical History:  Procedure Laterality Date  . APPENDECTOMY    . BACK SURGERY  2010   lumb fusion  . COLONOSCOPY    . EYE SURGERY     both cataracts  . KNEE ARTHROSCOPY WITH MEDIAL MENISECTOMY Left 09/08/2013   Procedure: LEFT KNEE ARTHROSCOPY WITH PARTIAL MEDIAL MENISCECTOMY;  Surgeon: [name], MD;  Location: [place];  Service: Orthopedics;  Laterality: Left;  . LUMBAR LAMINECTOMY/DECOMPRESSION MICRODISCECTOMY  05/13/2012   Procedure: LUMBAR LAMINECTOMY/DECOMPRESSION MICRODISCECTOMY 1 LEVEL;  Surgeon: [name], MD;  Location: [place] [facility];  Service: Neurosurgery;  Laterality: Bilateral;  Bilateral Lumbar one -two  Decompressive Laminectomy  . PARTIAL KNEE ARTHROPLASTY Left 04/27/2014   Procedure: LEFT UNICOMPARTMENTAL KNEE;  Surgeon: [name], MD;  Location: [place];  Service: Orthopedics;  Laterality: Left;  . UPPER GI ENDOSCOPY      There were no vitals filed for this visit.   Subjective Assessment - 11/04/20 1108    Subjective Pt reports aching and stiffness in her R shoulder. Pt reports pain has improved some and R shoulder does not bother her at rest.    Pertinent History arthritis    Limitations Lifting;Writing;House hold activities    Diagnostic tests ultrasound revealed subacromial bursitis and possible intersubstance rotator cuff tear    Patient Stated Goals decreased pain    Currently in Pain? Yes    Pain Score 6    Pt reports 0/10 pain at rest   Pain Location Shoulder    Pain Orientation Right    Pain Descriptors / Indicators Aching    Pain Type Acute pain    Pain Onset 1 to 4 weeks ago    Pain Frequency Intermittent                             [facility] Adult PT Treatment/Exercise - 11/04/20 0001      Shoulder Exercises: Supine   Internal  Rotation Right;10 reps    Internal Rotation Weight (lbs) 1 lb    Flexion AAROM;Both;10 reps    Flexion Limitations Shoulder flexion with hands clasped    Other Supine Exercises Shoulder flexion with cane 10 reps; R shoulder flexion at 90/90 x 10; serratus press with dowel    Other Supine Exercises Chest press 2 lb with cane x 15; Supine ER 2lb x 15;      Shoulder Exercises: Seated   Retraction 20 reps      Shoulder Exercises: Sidelying   External Rotation 12 reps    External Rotation Weight (lbs) 1      Shoulder Exercises: Standing   External Rotation 10 reps    Theraband Level (Shoulder External Rotation) Level 1 (Yellow)    Row 12 reps    Theraband Level (Shoulder Row) Level 2 (Red)    Other Standing Exercises wall slides x 10      Shoulder Exercises: Pulleys   Flexion 2 minutes     ABduction 1 minute      Manual Therapy   Manual therapy comments STM for supraspinatus    Passive ROM PROM for R shoulder flex, IR, ER    Manual Traction R shoulder distraction 10 sec x 10                  PT Education - 11/04/20 1116    Education Details reviewed HEP    Person(s) Educated Patient    Methods Explanation;Demonstration;Verbal cues;Tactile cues;Handout    Comprehension Verbalized understanding;Returned demonstration;Verbal cues required;Tactile cues required            PT Short Term Goals - 10/29/20 0756      PT SHORT TERM GOAL #1   Title Pt to be independent with initial HEP    Time 2    Period Weeks    Status New    Target Date 11/11/20             PT Long Term Goals - 10/29/20 0757      PT LONG TERM GOAL #1   Title Pt to be independent with final HEP    Time 6    Period Weeks    Status New    Target Date 12/09/20      PT LONG TERM GOAL #2   Title Pt to report decreased pain in R shoulder to at least 2/10 when performing reaching and lifting activities    Time 6    Period Weeks    Status New    Target Date 12/09/20      PT LONG TERM GOAL #3   Title Pt will increase R shoulder ROM to [facility] for ability to perform overhead functional activities such as cleaning a mirror or reaching in a cabinet.    Time 6    Period Weeks    Status New    Target Date 12/09/20      PT LONG TERM GOAL #4   Title Pt will increase R shoulder strength to at least 4+/5 to be able to lift pocket book without pain and participate in group exercise    Time 6    Period Weeks    Status New    Target Date 12/09/20                 Plan - 11/04/20 1118    Clinical Impression Statement Pt demonstrated improvement with therapeutic exercises and was able to progress to more resisted exercises with therabands. Pt  continues to have soreness in her R shoulder and pain in flexion and abduction motions. Manual therapy was performed over supraspinatus to relieve  pain. Plan to progress strengthening in R shoulder musculature as pain permits.    Examination-Activity Limitations Bathing;Carry;Dressing;Lift;Reach Overhead    Examination-Participation Restrictions Cleaning;Community Activity;Driving;Laundry;Meal Prep;Yard Work    Stability/Clinical Decision Making Stable/Uncomplicated    Rehab Potential Good    PT Frequency 2x / week    PT Duration 6 weeks    PT Treatment/Interventions ADLs/Self Care Home Management;Cryotherapy;Electrical Stimulation;Iontophoresis 4mg /ml Dexamethasone;Moist Heat;Traction;Ultrasound;Therapeutic exercise;Therapeutic activities;Functional mobility training;Stair training;Gait training;Balance training;Neuromuscular re-education;Patient/family education;Manual techniques;Vasopneumatic Device;Taping;Dry needling;Passive range of motion;Visual/perceptual remediation/compensation;Spinal Manipulations;Joint Manipulations;DME Instruction    PT Next Visit Plan Plan to progress therapuetic exercises for increased flexion and abduction strength as pain improves    Consulted and Agree with Plan of Care Patient           Patient will benefit from skilled therapeutic intervention in order to improve the following deficits and impairments:  Decreased range of motion,Impaired UE functional use,Pain,Decreased strength  Visit Diagnosis: Acute pain of right shoulder  Muscle weakness (generalized)     Problem List Patient Active Problem List   Diagnosis Date Noted  . Trochanteric bursitis, right hip 04/24/2020  . Essential hypertension 01/12/2020  . Sinus bradycardia 01/12/2020  . Dyspnea on exertion 01/12/2020  . Lumbar stenosis with neurogenic claudication 02/01/2017  . Osteoarthritis of left knee 04/27/2014  . Tear of medial meniscus of left knee 09/08/2013    Leighton Parody SPT 11/04/2020  This entire session was performed under direct supervision and direction of a licensed therapist/therapist assistant . I have personally  read, edited and approve of the note as written.  Lyndee Hensen, PT, DPT 12:26 PM  11/04/20    [facility] [place], [place], [address] Phone: [phone]   Fax:  [phone]  Name: Melissa Maldonado MRN: [id number] Date of Birth: [birth year]

## 2020-11-06 ENCOUNTER — Other Ambulatory Visit: Payer: Self-pay

## 2020-11-06 ENCOUNTER — Encounter: Payer: Self-pay | Admitting: Physical Therapy

## 2020-11-06 ENCOUNTER — Ambulatory Visit (INDEPENDENT_AMBULATORY_CARE_PROVIDER_SITE_OTHER): Payer: Medicare Other | Admitting: Physical Therapy

## 2020-11-06 DIAGNOSIS — M25511 Pain in right shoulder: Secondary | ICD-10-CM

## 2020-11-06 DIAGNOSIS — M6281 Muscle weakness (generalized): Secondary | ICD-10-CM

## 2020-11-06 NOTE — Therapy (Signed)
[facility] [address] [place], [place], [address] Phone: [phone]   Fax:  [phone]  Physical Therapy Treatment  Patient Details  Name: Melissa Maldonado MRN: [id number] Date of Birth: [birth year] Referring Provider (PT): [name]   Encounter Date: 11/06/2020   PT End of Session - 11/06/20 1135    Visit Number 4    Number of Visits 12    Date for PT Re-Evaluation 12/09/20    Authorization Type Medicare    Authorization Time Period 8 weeks    PT Start Time 1020    PT Stop Time 1048    PT Time Calculation (min) 28 min    Activity Tolerance Patient tolerated treatment well    Behavior During Therapy [facility] for tasks assessed/performed           Past Medical History:  Diagnosis Date  . Arthritis   . Chronic back pain    lumbar stenosis  . Early cataracts, bilateral   . GERD (gastroesophageal reflux disease)    occasionally but no meds required  . Headache(784.0)    occasionally  . History of blood transfusion   . History of colon polyps   . Hyperlipidemia    takes Lipitor every other day  . Hypertension    takes losartan daily  . Osteoarthritis of left knee 04/27/2014  . Shortness of breath    with exertion  . SOB (shortness of breath) 2013   CPET  . Tear of medial meniscus of left knee 09/08/2013    Past Surgical History:  Procedure Laterality Date  . APPENDECTOMY    . BACK SURGERY  2010   lumb fusion  . COLONOSCOPY    . EYE SURGERY     both cataracts  . KNEE ARTHROSCOPY WITH MEDIAL MENISECTOMY Left 09/08/2013   Procedure: LEFT KNEE ARTHROSCOPY WITH PARTIAL MEDIAL MENISCECTOMY;  Surgeon: [name], MD;  Location: [place];  Service: Orthopedics;  Laterality: Left;  . LUMBAR LAMINECTOMY/DECOMPRESSION MICRODISCECTOMY  05/13/2012   Procedure: LUMBAR LAMINECTOMY/DECOMPRESSION MICRODISCECTOMY 1 LEVEL;  Surgeon: [name], MD;  Location: [place] [facility];  Service: Neurosurgery;  Laterality:  Bilateral;  Bilateral Lumbar one -two Decompressive Laminectomy  . PARTIAL KNEE ARTHROPLASTY Left 04/27/2014   Procedure: LEFT UNICOMPARTMENTAL KNEE;  Surgeon: [name], MD;  Location: [facility];  Service: Orthopedics;  Laterality: Left;  . UPPER GI ENDOSCOPY      There were no vitals filed for this visit.   Subjective Assessment - 11/06/20 1128    Subjective Pt reports feeling sore after previous PT visit that was relieved with ice and rest. Still having pain with reaching up overhead    Pertinent History arthritis    Limitations Lifting;Writing;House hold activities    Diagnostic tests ultrasound revealed subacromial bursitis and possible intersubstance rotator cuff tear    Patient Stated Goals decreased pain    Currently in Pain? Yes    Pain Score 6     Pain Location Shoulder    Pain Orientation Right    Pain Descriptors / Indicators Aching    Pain Type Acute pain    Pain Onset 1 to 4 weeks ago    Pain Frequency Intermittent    Pain Relieving Factors ice              [facility] PT Assessment - 11/06/20 0001      AROM   Right Shoulder Extension 120 Degrees      PROM  Right Shoulder Flexion 140 Degrees                         [facility] Adult PT Treatment/Exercise - 11/06/20 0001      Shoulder Exercises: Supine   Flexion AAROM;Both;10 reps    Flexion Limitations Shoulder flexion with hands clasped    Other Supine Exercises R shoulder flexion at 90/90 x 10; pain    Other Supine Exercises Chest press 2 lb with cane x 15; Supine ER 2lb x 15;      Shoulder Exercises: Sidelying   External Rotation 15 reps    External Rotation Weight (lbs) 1      Shoulder Exercises: Standing   External Rotation 10 reps    Theraband Level (Shoulder External Rotation) Level 1 (Yellow)    Row 12 reps    Theraband Level (Shoulder Row) Level 2 (Red)    Other Standing Exercises wall slides x 10      Shoulder Exercises: Pulleys   Flexion 2 minutes     ABduction 2 minutes      Manual Therapy   Manual therapy comments STM for supraspinatus    Passive ROM PROM for R shoulder flex, IR, ER    Manual Traction R shoulder distraction 10 sec x 10                    PT Short Term Goals - 10/29/20 0756      PT SHORT TERM GOAL #1   Title Pt to be independent with initial HEP    Time 2    Period Weeks    Status New    Target Date 11/11/20             PT Long Term Goals - 10/29/20 0757      PT LONG TERM GOAL #1   Title Pt to be independent with final HEP    Time 6    Period Weeks    Status New    Target Date 12/09/20      PT LONG TERM GOAL #2   Title Pt to report decreased pain in R shoulder to at least 2/10 when performing reaching and lifting activities    Time 6    Period Weeks    Status New    Target Date 12/09/20      PT LONG TERM GOAL #3   Title Pt will increase R shoulder ROM to [facility] for ability to perform overhead functional activities such as cleaning a mirror or reaching in a cabinet.    Time 6    Period Weeks    Status New    Target Date 12/09/20      PT LONG TERM GOAL #4   Title Pt will increase R shoulder strength to at least 4+/5 to be able to lift pocket book without pain and participate in group exercise    Time 6    Period Weeks    Status New    Target Date 12/09/20                 Plan - 11/06/20 1135    Clinical Impression Statement Pts R shoulder flexion AROM and PROM has increased since intial evaluation and pt is able to move through a greater ROM without pain. Pts R supraspinatus muscle tension has reduced and she did not report any tenderness to palpation in her R scapular region. Pt continues to have pain with AROM flexion and  ER exercises. Plan to progress from [facility] to more AROM exercises with R UE.    Examination-Activity Limitations Bathing;Carry;Dressing;Lift;Reach Overhead    Examination-Participation Restrictions Cleaning;Community Activity;Driving;Laundry;Meal Prep;Yard  Work    Stability/Clinical Decision Making Stable/Uncomplicated    Rehab Potential Good    PT Frequency 2x / week    PT Duration 6 weeks    PT Treatment/Interventions ADLs/Self Care Home Management;Cryotherapy;Electrical Stimulation;Iontophoresis 4mg /ml Dexamethasone;Moist Heat;Traction;Ultrasound;Therapeutic exercise;Therapeutic activities;Functional mobility training;Stair training;Gait training;Balance training;Neuromuscular re-education;Patient/family education;Manual techniques;Vasopneumatic Device;Taping;Dry needling;Passive range of motion;Visual/perceptual remediation/compensation;Spinal Manipulations;Joint Manipulations;DME Instruction    PT Next Visit Plan Plan to perform single arm wall slides for R UE and progress to more AROM exercises on R.    Consulted and Agree with Plan of Care Patient           Patient will benefit from skilled therapeutic intervention in order to improve the following deficits and impairments:  Decreased range of motion,Impaired UE functional use,Pain,Decreased strength  Visit Diagnosis: Acute pain of right shoulder  Muscle weakness (generalized)     Problem List Patient Active Problem List   Diagnosis Date Noted  . Trochanteric bursitis, right hip 04/24/2020  . Essential hypertension 01/12/2020  . Sinus bradycardia 01/12/2020  . Dyspnea on exertion 01/12/2020  . Lumbar stenosis with neurogenic claudication 02/01/2017  . Osteoarthritis of left knee 04/27/2014  . Tear of medial meniscus of left knee 09/08/2013    Leighton Parody SPT 11/06/2020  This entire session was performed under direct supervision and direction of a licensed therapist/therapist assistant . I have personally read, edited and approve of the note as written.   Lyndee Hensen, PT, DPT 1:11 PM  11/06/20    [facility] [place], [place], [address] Phone: [phone]   Fax:  [phone]  Name: Melissa Maldonado MRN:  [id number] Date of Birth: [birth year]

## 2020-11-11 ENCOUNTER — Other Ambulatory Visit: Payer: Self-pay

## 2020-11-11 ENCOUNTER — Ambulatory Visit (INDEPENDENT_AMBULATORY_CARE_PROVIDER_SITE_OTHER): Payer: Medicare Other | Admitting: Physical Therapy

## 2020-11-11 ENCOUNTER — Encounter: Payer: Self-pay | Admitting: Physical Therapy

## 2020-11-11 DIAGNOSIS — M6281 Muscle weakness (generalized): Secondary | ICD-10-CM | POA: Diagnosis not present

## 2020-11-11 DIAGNOSIS — M25511 Pain in right shoulder: Secondary | ICD-10-CM | POA: Diagnosis not present

## 2020-11-11 DIAGNOSIS — M792 Neuralgia and neuritis, unspecified: Secondary | ICD-10-CM | POA: Diagnosis not present

## 2020-11-11 NOTE — Patient Instructions (Signed)
Access Code: [email or web address] URL: [email or web address] Date: 11/11/2020 Prepared by: Lyndee Hensen  Exercises Shoulder Flexion Wall Slide with Towel - 2 x daily - 2 sets - 10 reps Standing Row with Anchored Resistance - 2 x daily - 2 sets - 10 reps Shoulder External Rotation and Scapular Retraction with Resistance - 2 x daily - 2 sets - 10 reps Standing Shoulder Flexion Full Range - 2 x daily - 2 sets - 10 reps Wall Angels - 2 x daily - 2 sets - 10 reps Sidelying Shoulder External Rotation - 2 x daily - 2 sets - 10 reps

## 2020-11-11 NOTE — Therapy (Addendum)
[facility] [address] [place], [place], [address] Phone: [phone]   Fax:  [phone]  Physical Therapy Treatment  Patient Details  Name: Melissa Maldonado MRN: [id number] Date of Birth: 1940-10-03 Referring Provider (PT): [name]   Encounter Date: 11/11/2020   PT End of Session - 11/11/20 1015    Visit Number 5    Number of Visits 12    Date for PT Re-Evaluation 12/09/20    Authorization Type Medicare    Authorization Time Period 8 weeks    PT Start Time 0928    PT Stop Time 0954    PT Time Calculation (min) 26 min    Activity Tolerance Patient tolerated treatment well    Behavior During Therapy [facility] for tasks assessed/performed           Past Medical History:  Diagnosis Date  . Arthritis   . Chronic back pain    lumbar stenosis  . Early cataracts, bilateral   . GERD (gastroesophageal reflux disease)    occasionally but no meds required  . Headache(784.0)    occasionally  . History of blood transfusion   . History of colon polyps   . Hyperlipidemia    takes Lipitor every other day  . Hypertension    takes losartan daily  . Osteoarthritis of left knee 04/27/2014  . Shortness of breath    with exertion  . SOB (shortness of breath) 2013   CPET  . Tear of medial meniscus of left knee 09/08/2013    Past Surgical History:  Procedure Laterality Date  . APPENDECTOMY    . BACK SURGERY  2010   lumb fusion  . COLONOSCOPY    . EYE SURGERY     both cataracts  . KNEE ARTHROSCOPY WITH MEDIAL MENISECTOMY Left 09/08/2013   Procedure: LEFT KNEE ARTHROSCOPY WITH PARTIAL MEDIAL MENISCECTOMY;  Surgeon: [name], MD;  Location: [place];  Service: Orthopedics;  Laterality: Left;  . LUMBAR LAMINECTOMY/DECOMPRESSION MICRODISCECTOMY  05/13/2012   Procedure: LUMBAR LAMINECTOMY/DECOMPRESSION MICRODISCECTOMY 1 LEVEL;  Surgeon: [name], MD;  Location: [place] [facility];  Service: Neurosurgery;  Laterality:  Bilateral;  Bilateral Lumbar one -two Decompressive Laminectomy  . PARTIAL KNEE ARTHROPLASTY Left 04/27/2014   Procedure: LEFT UNICOMPARTMENTAL KNEE;  Surgeon: [name], MD;  Location: [place];  Service: Orthopedics;  Laterality: Left;  . UPPER GI ENDOSCOPY      There were no vitals filed for this visit.   Subjective Assessment - 11/11/20 0928    Subjective Pt reports her shoulder is feeling better and only experiences pain and stiffness in the AM after sleeping on her shoulder. Pt reports she is feeling 95% better compared to initial visit.    Pertinent History arthritis    Limitations Lifting;Writing;House hold activities    Patient Stated Goals decreased pain    Currently in Pain? No/denies    Pain Location Shoulder    Pain Orientation Right    Pain Descriptors / Indicators Aching    Pain Type Acute pain    Pain Onset 1 to 4 weeks ago    Pain Frequency Intermittent    Aggravating Factors  sleeping on R shoulder              [facility] PT Assessment - 11/11/20 0001      AROM   Right Shoulder Flexion 125 Degrees    Right Shoulder ABduction 105 Degrees  [facility] Adult PT Treatment/Exercise - 11/11/20 0926      Shoulder Exercises: Supine   Flexion Right;10 reps    Shoulder Flexion Weight (lbs) 2    Flexion Limitations --    Other Supine Exercises R shoulder flexion at 90/90 x 10;    Other Supine Exercises Chest press 2 lb with cane x 15; Supine ER 2lb x 15;      Shoulder Exercises: Sidelying   External Rotation 15 reps    External Rotation Weight (lbs) 2      Shoulder Exercises: Standing   External Rotation 10 reps    Theraband Level (Shoulder External Rotation) Level 2 (Red)    Row 12 reps    Theraband Level (Shoulder Row) Level 3 (Green)    Other Standing Exercises wall slides R UE x 10      Shoulder Exercises: Pulleys   Flexion --    ABduction --      Manual Therapy   Manual therapy comments --     Passive ROM PROM for R shoulder flex, IR, ER    Manual Traction --                    PT Short Term Goals - 11/11/20 1022      PT SHORT TERM GOAL #1   Title Pt to be independent with initial HEP    Time 2    Period Weeks    Status Achieved    Target Date 11/11/20             PT Long Term Goals - 10/29/20 0757      PT LONG TERM GOAL #1   Title Pt to be independent with final HEP    Time 6    Period Weeks    Status New    Target Date 12/09/20      PT LONG TERM GOAL #2   Title Pt to report decreased pain in R shoulder to at least 2/10 when performing reaching and lifting activities    Time 6    Period Weeks    Status New    Target Date 12/09/20      PT LONG TERM GOAL #3   Title Pt will increase R shoulder ROM to [facility] for ability to perform overhead functional activities such as cleaning a mirror or reaching in a cabinet.    Time 6    Period Weeks    Status New    Target Date 12/09/20      PT LONG TERM GOAL #4   Title Pt will increase R shoulder strength to at least 4+/5 to be able to lift pocket book without pain and participate in group exercise    Time 6    Period Weeks    Status New    Target Date 12/09/20                 Plan - 11/11/20 0955    Clinical Impression Statement Pts symptoms have improved and pt does not have an increase in pain with reaching or functional activities. Pts flexion and abduction AROM have increased since initial visit and pt is able to move through full AROM without pain. Pt continues to have some discomfort in her anterior shoulder during passive ER. Pt was able to perform a greater number of repetitions and increase resistance with exercises without an increase in pain. Pt to benefit from 1-2 more visits for Finalizing HEP and ensuring pain relief with all  motions. Plan to discuss discharge with pt at next appointment.    Examination-Activity Limitations Bathing;Carry;Dressing;Lift;Reach Overhead     Examination-Participation Restrictions Cleaning;Community Activity;Driving;Laundry;Meal Prep;Yard Work    Stability/Clinical Decision Making Stable/Uncomplicated    Rehab Potential Good    PT Frequency 2x / week    PT Duration 6 weeks    PT Treatment/Interventions ADLs/Self Care Home Management;Cryotherapy;Electrical Stimulation;Iontophoresis 4mg /ml Dexamethasone;Moist Heat;Traction;Ultrasound;Therapeutic exercise;Therapeutic activities;Functional mobility training;Stair training;Gait training;Balance training;Neuromuscular re-education;Patient/family education;Manual techniques;Vasopneumatic Device;Taping;Dry needling;Passive range of motion;Visual/perceptual remediation/compensation;Spinal Manipulations;Joint Manipulations;DME Instruction    PT Next Visit Plan Plan to progress ER strengthening exercises and discuss discharge at next visit.    PT Home Exercise Plan [email or web address]    Consulted and Agree with Plan of Care Patient           Patient will benefit from skilled therapeutic intervention in order to improve the following deficits and impairments:  Decreased range of motion,Impaired UE functional use,Pain,Decreased strength  Visit Diagnosis: Acute pain of right shoulder  Muscle weakness (generalized)     Problem List Patient Active Problem List   Diagnosis Date Noted  . Trochanteric bursitis, right hip 04/24/2020  . Essential hypertension 01/12/2020  . Sinus bradycardia 01/12/2020  . Dyspnea on exertion 01/12/2020  . Lumbar stenosis with neurogenic claudication 02/01/2017  . Osteoarthritis of left knee 04/27/2014  . Tear of medial meniscus of left knee 09/08/2013   Leighton Parody SPT 11/11/2020  Lyndee Hensen, PT, DPT 11:53 AM  11/11/20  This entire session was performed under direct supervision and direction of a licensed therapist/therapist assistant . I have personally read, edited and approve of the note as written.   [facility] [address] [place], [place], [address] Phone: [phone]   Fax:  [phone]  Name: Melissa Maldonado MRN: [id number] Date of Birth: 05/23/1941

## 2020-11-13 ENCOUNTER — Encounter: Payer: Self-pay | Admitting: Physical Therapy

## 2020-11-13 ENCOUNTER — Other Ambulatory Visit: Payer: Self-pay

## 2020-11-13 ENCOUNTER — Ambulatory Visit (INDEPENDENT_AMBULATORY_CARE_PROVIDER_SITE_OTHER): Payer: Medicare Other | Admitting: Physical Therapy

## 2020-11-13 DIAGNOSIS — M6281 Muscle weakness (generalized): Secondary | ICD-10-CM

## 2020-11-13 DIAGNOSIS — M25511 Pain in right shoulder: Secondary | ICD-10-CM

## 2020-11-13 NOTE — Therapy (Signed)
[facility] [address] [place], [place], [address] Phone: [phone]   Fax:  [phone]  Physical Therapy Treatment/Discharge Note  Patient Details  Name: Melissa Maldonado MRN: [id number] Date of Birth: [birth year] Referring Provider (PT): [name]   Encounter Date: 11/13/2020   PT End of Session - 11/13/20 1149    Visit Number 6    Number of Visits 12    Date for PT Re-Evaluation 12/09/20    Authorization Type Medicare    Authorization Time Period 8 weeks    PT Start Time 1016    PT Stop Time 1047    PT Time Calculation (min) 31 min    Activity Tolerance Patient tolerated treatment well    Behavior During Therapy [facility] for tasks assessed/performed           Past Medical History:  Diagnosis Date  . Arthritis   . Chronic back pain    lumbar stenosis  . Early cataracts, bilateral   . GERD (gastroesophageal reflux disease)    occasionally but no meds required  . Headache(784.0)    occasionally  . History of blood transfusion   . History of colon polyps   . Hyperlipidemia    takes Lipitor every other day  . Hypertension    takes losartan daily  . Osteoarthritis of left knee 04/27/2014  . Shortness of breath    with exertion  . SOB (shortness of breath) 2013   CPET  . Tear of medial meniscus of left knee 09/08/2013    Past Surgical History:  Procedure Laterality Date  . APPENDECTOMY    . BACK SURGERY  2010   lumb fusion  . COLONOSCOPY    . EYE SURGERY     both cataracts  . KNEE ARTHROSCOPY WITH MEDIAL MENISECTOMY Left 09/08/2013   Procedure: LEFT KNEE ARTHROSCOPY WITH PARTIAL MEDIAL MENISCECTOMY;  Surgeon: [name], MD;  Location: [facility];  Service: Orthopedics;  Laterality: Left;  . LUMBAR LAMINECTOMY/DECOMPRESSION MICRODISCECTOMY  05/13/2012   Procedure: LUMBAR LAMINECTOMY/DECOMPRESSION MICRODISCECTOMY 1 LEVEL;  Surgeon: [name], MD;  Location: [facility];  Service: Neurosurgery;   Laterality: Bilateral;  Bilateral Lumbar one -two Decompressive Laminectomy  . PARTIAL KNEE ARTHROPLASTY Left 04/27/2014   Procedure: LEFT UNICOMPARTMENTAL KNEE;  Surgeon: [name], MD;  Location: [facility];  Service: Orthopedics;  Laterality: Left;  . UPPER GI ENDOSCOPY      There were no vitals filed for this visit.   Subjective Assessment - 11/13/20 1141    Subjective Pt reports she is feeling good and only has slight pain with end range motions.    Pertinent History arthritis    Limitations Lifting;Writing;House hold activities    Patient Stated Goals decreased pain    Currently in Pain? No/denies    Pain Location Shoulder    Pain Orientation Right    Pain Type Acute pain    Pain Onset 1 to 4 weeks ago    Pain Frequency Intermittent    Aggravating Factors  reaching    Pain Relieving Factors ice                             [facility] Adult PT Treatment/Exercise - 11/13/20 0001      Shoulder Exercises: Supine   Flexion Right;10 reps    Shoulder Flexion Weight (lbs) 2    Other Supine Exercises --    Other Supine Exercises  Chest press 2 lb with cane x 15; Supine ER 2lb x 15;      Shoulder Exercises: Sidelying   External Rotation 15 reps    External Rotation Weight (lbs) 2      Shoulder Exercises: Standing   External Rotation 10 reps    Theraband Level (Shoulder External Rotation) Level 2 (Red)    Row 12 reps    Theraband Level (Shoulder Row) Level 3 (Green)    Other Standing Exercises wall angles x20    Other Standing Exercises standing Y x 20      Manual Therapy   Passive ROM PROM for R shoulder flex, IR, ER    Manual Traction R shoulder distraction 10 sec x 10                  PT Education - 11/13/20 1149    Education Details reviewed final HEP    Person(s) Educated Patient    Methods Explanation;Demonstration;Verbal cues;Tactile cues    Comprehension Verbalized understanding;Returned demonstration            PT  Short Term Goals - 11/11/20 1022      PT SHORT TERM GOAL #1   Title Pt to be independent with initial HEP    Time 2    Period Weeks    Status Achieved    Target Date 11/11/20             PT Long Term Goals - 11/13/20 1220      PT LONG TERM GOAL #1   Title Pt to be independent with final HEP    Time 6    Period Weeks    Status Achieved      PT LONG TERM GOAL #2   Title Pt to report decreased pain in R shoulder to at least 2/10 when performing reaching and lifting activities    Time 6    Period Weeks    Status Achieved      PT LONG TERM GOAL #3   Title Pt will increase R shoulder ROM to [facility] for ability to perform overhead functional activities such as cleaning a mirror or reaching in a cabinet.    Time 6    Period Weeks    Status Achieved      PT LONG TERM GOAL #4   Title Pt will increase R shoulder strength to at least 4+/5 to be able to lift pocket book without pain and participate in group exercise    Time 6    Period Weeks    Status Achieved                 Plan - 11/13/20 1214    Clinical Impression Statement Pts pain is much improved .She does have mild soreness with PROM end range flexion motions today, improved with joint mobilization and stretching. Educated on continuting this for HEP, but pain does not interfere with ability to perform ADLs. Pt is proficient with HEP and can perform HEP without an increase in pain. Pts R shoulder AROM is WNL and pt is able to perform functional activities in her home without difficulty due to shoulder pain. Pts R shoulder MMT was 5/5 with all motions and pt is able to lift objects without an increase in pain. PT is not necessary at this time due to patients ability to move through full AROM to be able to perform reaching tasks and improved muscle strength to be able to lift pocketbook without pain. Pt verbalized  agreement with discharge and stated she is feeling better.    Examination-Activity Limitations  Bathing;Carry;Dressing;Lift;Reach Overhead    Examination-Participation Restrictions Cleaning;Community Activity;Driving;Laundry;Meal Prep;Yard Work    Stability/Clinical Decision Making Stable/Uncomplicated    Rehab Potential Good    PT Frequency 2x / week    PT Duration 6 weeks    PT Treatment/Interventions ADLs/Self Care Home Management;Cryotherapy;Electrical Stimulation;Iontophoresis 74m/ml Dexamethasone;Moist Heat;Traction;Ultrasound;Therapeutic exercise;Therapeutic activities;Functional mobility training;Stair training;Gait training;Balance training;Neuromuscular re-education;Patient/family education;Manual techniques;Vasopneumatic Device;Taping;Dry needling;Passive range of motion;Visual/perceptual remediation/compensation;Spinal Manipulations;Joint Manipulations;DME Instruction    PT Home Exercise Plan [id number]   Consulted and Agree with Plan of Care Patient           Patient will benefit from skilled therapeutic intervention in order to improve the following deficits and impairments:  Decreased range of motion,Impaired UE functional use,Pain,Decreased strength  Visit Diagnosis: Acute pain of right shoulder  Muscle weakness (generalized)     Problem List Patient Active Problem List   Diagnosis Date Noted  . Trochanteric bursitis, right hip 04/24/2020  . Essential hypertension 01/12/2020  . Sinus bradycardia 01/12/2020  . Dyspnea on exertion 01/12/2020  . Lumbar stenosis with neurogenic claudication 02/01/2017  . Osteoarthritis of left knee 04/27/2014  . Tear of medial meniscus of left knee 09/08/2013    DLeighton ParodySPT 11/13/2020  LLyndee Hensen PT, DPT 2:29 PM  11/13/20   This entire session was performed under direct supervision and direction of a licensed therapist/therapist assistant . I have personally read, edited and approve of the note as written.   Melissa Ridge[address]RDaw[address] [place]  [address]Phone: [phone]  Fax:  [phone] Name: Melissa HAYASHIMRN: [id number] of Birth: 121-Feb-1942  PHYSICAL THERAPY DISCHARGE SUMMARY  Visits from Start of Care:  6  Plan: Patient agrees to discharge.  Patient goals were met. Patient is being discharged due to meeting the stated rehab goals.  ?????     LLyndee Hensen PT, DPT 2:30 PM  11/13/20

## 2020-11-21 DIAGNOSIS — H401221 Low-tension glaucoma, left eye, mild stage: Secondary | ICD-10-CM | POA: Diagnosis not present

## 2020-11-21 DIAGNOSIS — H40011 Open angle with borderline findings, low risk, right eye: Secondary | ICD-10-CM | POA: Diagnosis not present

## 2020-11-29 NOTE — Progress Notes (Signed)
I, Peterson Lombard, LAT, ATC acting as a scribe for [name], MD.  Melissa Maldonado is a 80 y.o. female who presents to [place] at [facility] today for f/u of R shoulder pain.  She was last seen by [name] on 10/21/20 and had a R subacromial injection.  She was referred to PT of which she's completed 6 sessions.  Since her last visit, pt reports shoulder is much better, 80-90% improvement. Pt was dc from PT and has continued to do HEP.  She notes however her right knee is also hurting.  This has been problematic now for a few months and is worsening recently.  She has a history of left partial knee replacement.  She fears her right knee pain is due to arthritis.  Diagnostic testing: R shoulder XR- 10/21/20   Pertinent review of systems: No fevers or chills  Relevant historical information: Hypertension   Exam:  BP (!) 142/80 (BP Location: Right Arm, Patient Position: Sitting, Cuff Size: Normal)   Pulse (!) 44   Ht 5\' 5"  (1.651 m)   Wt 195 lb (88.5 kg)   SpO2 99%   BMI 32.45 kg/m  General: Well Developed, well nourished, and in no acute distress.   MSK: Right shoulder normal-appearing normal motion normal strength  Right knee mild effusion normal motion with crepitation.  Tender palpation medial joint line. Stable ligamentous exam.    Lab and Radiology Results  X-ray images right knee obtained today personally and independently interpreted Moderate to severe medial compartment DJD.  Mild to moderate patellofemoral DJD.  No acute fractures. Await formal radiology review  Procedure: Real-time Ultrasound Guided Injection of right knee superior lateral patellar space Device: Philips Affiniti 50G Images permanently stored and available for review in PACS Verbal informed consent obtained.  Discussed risks and benefits of procedure. Warned about infection bleeding damage to structures skin hypopigmentation and fat atrophy among others. Patient expresses  understanding and agreement Time-out conducted.   Noted no overlying erythema, induration, or other signs of local infection.   Skin prepped in a sterile fashion.   Local anesthesia: Topical Ethyl chloride.   With sterile technique and under real time ultrasound guidance:  40 mg of Kenalog and 2 mL of Marcaine injected into knee joint. Fluid seen entering the joint capsule.   Completed without difficulty   Pain immediately resolved suggesting accurate placement of the medication.   Advised to call if fevers/chills, erythema, induration, drainage, or persistent bleeding.   Images permanently stored and available for review in the ultrasound unit.  Impression: Technically successful ultrasound guided injection.          Assessment and Plan: 80 y.o. female with right shoulder pain significantly improved with physical therapy.  Continue ongoing home exercise program and recheck back as needed for this issue.  Right knee pain: Chronic issue worsening recently.  Pain thought to be due to medial compartment DJD seen on x-ray today.  Patient has a history of left partial knee replacement.  Plan for steroid injection, Voltaren gel, and recheck in 6 weeks.   PDMP not reviewed this encounter. Orders Placed This Encounter  Procedures  . DG Knee AP/LAT W/Sunrise Right    Standing Status:   Future    Number of Occurrences:   1    Standing Expiration Date:   12/02/2021    Order Specific Question:   Reason for Exam (SYMPTOM  OR DIAGNOSIS REQUIRED)    Answer:   eval knee pain  Order Specific Question:   Preferred imaging location?    Answer:   [facility]  . [place] LIMITED JOINT SPACE STRUCTURES LOW RIGHT(NO LINKED CHARGES)    Standing Status:   Future    Number of Occurrences:   1    Standing Expiration Date:   06/04/2021    Order Specific Question:   Reason for Exam (SYMPTOM  OR DIAGNOSIS REQUIRED)    Answer:   right knee pain    Order Specific Question:   Preferred imaging  location?    Answer:   [place]   No orders of the defined types were placed in this encounter.    Discussed warning signs or symptoms. Please see discharge instructions. Patient expresses understanding.   The above documentation has been reviewed and is accurate and complete [name], M.D.

## 2020-12-02 ENCOUNTER — Ambulatory Visit (INDEPENDENT_AMBULATORY_CARE_PROVIDER_SITE_OTHER): Payer: Medicare Other

## 2020-12-02 ENCOUNTER — Other Ambulatory Visit: Payer: Self-pay

## 2020-12-02 ENCOUNTER — Ambulatory Visit: Payer: Self-pay

## 2020-12-02 ENCOUNTER — Ambulatory Visit (INDEPENDENT_AMBULATORY_CARE_PROVIDER_SITE_OTHER): Payer: Medicare Other | Admitting: Family Medicine

## 2020-12-02 VITALS — BP 142/80 | HR 44 | Ht 65.0 in | Wt 195.0 lb

## 2020-12-02 DIAGNOSIS — M25511 Pain in right shoulder: Secondary | ICD-10-CM | POA: Diagnosis not present

## 2020-12-02 DIAGNOSIS — M25561 Pain in right knee: Secondary | ICD-10-CM

## 2020-12-02 DIAGNOSIS — G8929 Other chronic pain: Secondary | ICD-10-CM

## 2020-12-02 DIAGNOSIS — M1711 Unilateral primary osteoarthritis, right knee: Secondary | ICD-10-CM | POA: Diagnosis not present

## 2020-12-02 DIAGNOSIS — M792 Neuralgia and neuritis, unspecified: Secondary | ICD-10-CM | POA: Diagnosis not present

## 2020-12-02 NOTE — Patient Instructions (Signed)
Thank you for coming in today.  Please get an Xray today before you leave  Please use Voltaren gel (Generic Diclofenac Gel) up to 4x daily for pain as needed.  This is available over-the-counter as both the name brand Voltaren gel and the generic diclofenac gel.  Recheck in 6 weeks.   I will work on getting the gel shot authorized.

## 2020-12-03 NOTE — Progress Notes (Signed)
X-ray right knee shows arthritis throughout the knee.

## 2020-12-05 ENCOUNTER — Ambulatory Visit (INDEPENDENT_AMBULATORY_CARE_PROVIDER_SITE_OTHER): Payer: Medicare Other | Admitting: Neurology

## 2020-12-05 ENCOUNTER — Encounter: Payer: Self-pay | Admitting: Neurology

## 2020-12-05 VITALS — BP 139/60 | HR 56 | Ht 65.0 in | Wt 191.0 lb

## 2020-12-05 DIAGNOSIS — M5441 Lumbago with sciatica, right side: Secondary | ICD-10-CM | POA: Diagnosis not present

## 2020-12-05 DIAGNOSIS — R202 Paresthesia of skin: Secondary | ICD-10-CM

## 2020-12-05 DIAGNOSIS — I10 Essential (primary) hypertension: Secondary | ICD-10-CM | POA: Diagnosis not present

## 2020-12-05 MED ORDER — DULOXETINE HCL 60 MG PO CPEP: 60.0000 mg | ORAL_CAPSULE | Freq: Every day | ORAL | 12 refills | Status: DC

## 2020-12-05 NOTE — Progress Notes (Signed)
Chief Complaint  Patient presents with  . New Patient (Initial Visit)    Reports burning, numbness and pain in her bilateral feet. She did not get any relief with gabapentin 100mg , one cap TID and stopped it. She has not tried any other medications.      ASSESSMENT AND PLAN  Melissa Maldonado is a 80 y.o. female   History of lumbar degenerative disease, multiple previous decompression surgery 2 months history of bilateral feet paresthesia, worsening right-sided low back pain, radiating pain to right hip, posterior thigh and  Calf.  Differentiation diagnosis include lumbar radiculopathy, versus peripheral neuropathy  MRI of lumbar spine  EMG nerve conduction study  Previously tried gabapentin without help, she now has constant 8 out of 10 low back pain, will try Cymbalta 60 mg daily, may mix with over-the-counter NSAIDs as needed  DIAGNOSTIC DATA (LABS, IMAGING, TESTING) - I reviewed patient records, labs, notes, testing and imaging myself where available.  CT lumbar spine, myelogram April 22, 2020 1. Prior L1-L5 solid arthrodesis with unchanged residual mild bilateral neuroforaminal stenosis at L1-L2. 2. Similar appearing right L5 transverse process and sacrum pseudoarthrosis with spurring impinging on the extraforaminal right L5 nerve root. 3. New partially visualized left-sided disc protrusion at T11-T12 with effacement of the left ventral thecal sac.  MRI of lumbar spine from [facility] system on January 07, 2015  1.Postsurgical changes of PLIF at L2-3 and L4-5, with advanced adjacent level degenerative changes at L1-2. Specifically, at L1-2 there is severe disc height loss and facet hypertrophy resulting in advanced neural foraminal narrowing bilaterally and mild to moderate central stenosis. There is no substantial spinal stenosis at L2-3 through L5-S1.  2.Additional mild multilevel degenerative changes as described above.  HISTORICAL  Melissa Maldonado is 80 year old female,  seen in request by primary care physician Dr. Shelia Media, [facility] Jew for evaluation of bilateral feet burning, numbness, initial evaluation was on Dec 05, 2020   I reviewed and summarized the referring note.  Past medical history Hypertension  She had long history of chronic low back pain, multiple lumbar decompression surgery in the past, first around 2020 by Dr. Lorin Mercy, presenting with severe low back pain radiating pain by lower extremity  Also had decompression by Dr Ellene Route on 05/13/2012 for bilateral L1-2 laminotomy foraminotomy.  Most recent surgery was in 2018, for lumbar stenosis with neurogenic claudication, lumbar stenosis L1-L2, status post decompression and fusion L2-3 and L4-5.     Previous surgery did help her low back pain temporarily, she also has to deal with moderate to severe chronic midline low back pain, but she denied bilateral lower extremity paresthesia in the past, only since March 2022, she began to notice symmetric numbness at the ball of bilateral feet, denies burning pain, mainly lack of sensation, she had recurrent right-sided low back pain, radiating pain to right hip, lateral thigh and, right calf, her gait is funny because of 8 out of 10 low back pain,  She denies finger paresthesia, denies bowel and bladder incontinence   PHYSICAL EXAM:   Vitals:   12/05/20 1040  BP: 139/60  Pulse: (!) 56  Weight: 191 lb (86.6 kg)  Height: 5\' 5"  (1.651 m)   Not recorded     Body mass index is 31.78 kg/m.  PHYSICAL EXAMNIATION:  Gen: NAD, conversant, well nourised, well groomed                     Cardiovascular: Regular rate rhythm, no peripheral edema, warm,  nontender. Eyes: Conjunctivae clear without exudates or hemorrhage Neck: Supple, no carotid bruits. Pulmonary: Clear to auscultation bilaterally   NEUROLOGICAL EXAM:  MENTAL STATUS: Speech:    Speech is normal; fluent and spontaneous with normal comprehension.  Cognition:     Orientation to time, place and  person     Normal recent and remote memory     Normal Attention span and concentration     Normal Language, naming, repeating,spontaneous speech     Fund of knowledge   CRANIAL NERVES: CN II: Visual fields are full to confrontation. Pupils are round equal and briskly reactive to light. CN III, IV, VI: extraocular movement are normal. No ptosis. CN V: Facial sensation is intact to light touch CN VII: Face is symmetric with normal eye closure  CN VIII: Hearing is normal to causal conversation. CN IX, X: Phonation is normal. CN XI: Head turning and shoulder shrug are intact  MOTOR: She has slight bilateral toe flexion weakness,  REFLEXES: Reflexes are 2+ and symmetric at the biceps, triceps, trace at knees, and absent at ankles. Plantar responses are flexor.  SENSORY: Length dependent decreased to light touch, pinprick and vibratory sensation to distal shin level  COORDINATION: There is no trunk or limb dysmetria noted.  GAIT/STANCE: She can get up from seated position arm crossed, steady, mild difficulty standing up on tiptoe, heels, mild difficulty with tandem walking  REVIEW OF SYSTEMS:  Full 14 system review of systems performed and notable only for as above All other review of systems were negative.   ALLERGIES: Allergies  Allergen Reactions  . Aspirin Other (See Comments)    "knocked out"  . Vicks Vaporub [Liniments] Other (See Comments)    Makes her feel "out of it"  . Codeine Nausea Only    HOME MEDICATIONS: Current Outpatient Medications  Medication Sig Dispense Refill  . Acetaminophen (TYLENOL 8 HOUR ARTHRITIS PAIN PO) Take 2-4 tablets by mouth daily.    . cholecalciferol (VITAMIN D3) 25 MCG (1000 UNIT) tablet Take 1,000 Units by mouth daily.    Marland Kitchen rOPINIRole (REQUIP) 0.5 MG tablet Take 0.5 mg by mouth at bedtime.    Marland Kitchen telmisartan-hydrochlorothiazide (MICARDIS HCT) 40-12.5 MG tablet Take 1 tablet by mouth daily.     No current facility-administered  medications for this visit.    PAST MEDICAL HISTORY: Past Medical History:  Diagnosis Date  . Arthritis   . Chronic back pain    lumbar stenosis  . Early cataracts, bilateral   . GERD (gastroesophageal reflux disease)    occasionally but no meds required  . Headache(784.0)    occasionally  . History of blood transfusion   . History of colon polyps   . Hyperlipidemia    takes Lipitor every other day  . Hypertension    takes losartan daily  . Neuropathy   . Osteoarthritis of left knee 04/27/2014  . Restless leg   . Shortness of breath    with exertion  . SOB (shortness of breath) 2013   CPET  . Tear of medial meniscus of left knee 09/08/2013    PAST SURGICAL HISTORY: Past Surgical History:  Procedure Laterality Date  . APPENDECTOMY    . BACK SURGERY  2010   lumb fusion  . COLONOSCOPY    . EYE SURGERY     both cataracts  . KNEE ARTHROSCOPY WITH MEDIAL MENISECTOMY Left 09/08/2013   Procedure: LEFT KNEE ARTHROSCOPY WITH PARTIAL MEDIAL MENISCECTOMY;  Surgeon: [name], MD;  [facility]: [facility];  Service: Orthopedics;  Laterality: Left;  . LUMBAR LAMINECTOMY/DECOMPRESSION MICRODISCECTOMY  05/13/2012   Procedure: LUMBAR LAMINECTOMY/DECOMPRESSION MICRODISCECTOMY 1 LEVEL;  Surgeon: [name], MD;  Location: [facility];  Service: Neurosurgery;  Laterality: Bilateral;  Bilateral Lumbar one -two Decompressive Laminectomy  . PARTIAL KNEE ARTHROPLASTY Left 04/27/2014   Procedure: LEFT UNICOMPARTMENTAL KNEE;  Surgeon: [name], MD;  Location: [facility];  Service: Orthopedics;  Laterality: Left;  . UPPER GI ENDOSCOPY      FAMILY HISTORY: Family History  Problem Relation Age of Onset  . Lupus Mother   . Heart attack Father     SOCIAL HISTORY: Social History   Socioeconomic History  . Marital status: Married    Spouse name: Not on file  . Number of children: 2  . Years of education: 87  . Highest education level: High school  graduate  Occupational History  . Occupation: Retired  Tobacco Use  . Smoking status: Never Smoker  . Smokeless tobacco: Never Used  Substance and Sexual Activity  . Alcohol use: Yes    Comment: socially wine  . Drug use: Never  . Sexual activity: Yes    Birth control/protection: Post-menopausal  Other Topics Concern  . Not on file  Social History Narrative   Lives at home with her husband.   Right-handed.   One cup coffee per day.   Social Determinants of Health   Financial Resource Strain: Not on file  Food Insecurity: Not on file  Transportation Needs: Not on file  Physical Activity: Not on file  Stress: Not on file  Social Connections: Not on file  Intimate Partner Violence: Not on file      Marcial Pacas, M.D. Ph.D.  [facility] [address] [place], [place] [address] Ph: [phone] Fax: [phone]  CC:  [name], MD [place],  [place] [phone]  [name], MD

## 2020-12-27 ENCOUNTER — Other Ambulatory Visit: Payer: Self-pay | Admitting: Neurology

## 2021-01-10 NOTE — Progress Notes (Signed)
   I, [name], LAT, ATC, am serving as scribe for Dr. [name].  Melissa Maldonado is a 80 y.o. female who presents to [place] at [facility] today for f/u of R shoulder and R knee pain.  She was last seen by [name] on 12/02/20 and noted 80-90% improvement in her R shoulder pain and had been d/c from PT.  She also reported R knee pain x several months and had a R knee injection.  Since her last visit, pt reports she got a lot of relief from prior R knee steroid injection. Pt report R shoulder is also doing better. She is not having any issues to address today.  Diagnostic testing: R knee XR- 12/02/20; R shoulder XR- 10/21/20  Pertinent review of systems: No fevers or chills  Relevant historical information: Hypertension   Exam:  BP 138/82 (BP Location: Right Arm, Patient Position: Sitting, Cuff Size: Normal)   Pulse (!) 50   Ht 5\' 5"  (1.651 m)   Wt 188 lb 9.6 oz (85.5 kg)   SpO2 98%   BMI 31.38 kg/m  General: Well Developed, well nourished, and in no acute distress.   MSK: Right shoulder normal motion.  Normal strength Right knee normal motion.  Nontender.    Lab and Radiology Results  EXAM: RIGHT KNEE 3 VIEWS   COMPARISON:  None.   FINDINGS: Mild joint space loss and spurring of the medial compartment. Mild spurring of the lateral compartment. Mild joint space loss and spurring of the patellofemoral compartment. Minimal suprapatellar knee joint effusion.   IMPRESSION: Tricompartmental osteoarthrosis of the right knee.     Electronically Signed   By: Miachel Roux M.D.   On: 12/03/2020 10:10   EXAM: RIGHT SHOULDER - 2+ VIEW   COMPARISON:  None.   FINDINGS: Acromioclavicular degenerative change with moderate spurring. Glenohumeral degenerative change with inferior glenoid spurring. No significant glenohumeral joint space narrowing. Minimal subcortical cystic change in the lateral humeral head. No evidence of fracture, erosion, avascular  necrosis or focal bone lesion. No definite soft tissue abnormality.   IMPRESSION: 1. Acromioclavicular and glenohumeral degenerative change. 2. Minimal subcortical cystic change in the lateral humeral head, can be seen in the setting of rotator cuff arthropathy.     Electronically Signed   By: Keith Rake M.D.   On: 10/22/2020 13:15  I, [name], personally (independently) visualized and performed the interpretation of the images attached in this note.     Assessment and Plan: 80 y.o. female with right knee and shoulder pain.  Both resolved with steroid injection and PT.  Doing quite well currently.  Patient is happy with how things are going.  Recheck as needed.  Precautions reviewed.  Stressed the importance of ongoing home exercise program.    Discussed warning signs or symptoms. Please see discharge instructions. Patient expresses understanding.   The above documentation has been reviewed and is accurate and complete [name], M.D.

## 2021-01-13 ENCOUNTER — Ambulatory Visit (INDEPENDENT_AMBULATORY_CARE_PROVIDER_SITE_OTHER): Payer: Medicare Other | Admitting: Family Medicine

## 2021-01-13 ENCOUNTER — Other Ambulatory Visit: Payer: Self-pay

## 2021-01-13 VITALS — BP 138/82 | HR 50 | Ht 65.0 in | Wt 188.6 lb

## 2021-01-13 DIAGNOSIS — G8929 Other chronic pain: Secondary | ICD-10-CM

## 2021-01-13 DIAGNOSIS — M25561 Pain in right knee: Secondary | ICD-10-CM | POA: Diagnosis not present

## 2021-01-13 DIAGNOSIS — M25511 Pain in right shoulder: Secondary | ICD-10-CM | POA: Diagnosis not present

## 2021-01-13 NOTE — Patient Instructions (Signed)
Thank you for coming in today.   Continue home exercises.   Stay active and recheck with me as needed.

## 2021-01-15 ENCOUNTER — Encounter (INDEPENDENT_AMBULATORY_CARE_PROVIDER_SITE_OTHER): Payer: Medicare Other | Admitting: Neurology

## 2021-01-15 ENCOUNTER — Ambulatory Visit (INDEPENDENT_AMBULATORY_CARE_PROVIDER_SITE_OTHER): Payer: Medicare Other | Admitting: Neurology

## 2021-01-15 DIAGNOSIS — Z0289 Encounter for other administrative examinations: Secondary | ICD-10-CM

## 2021-01-15 DIAGNOSIS — R202 Paresthesia of skin: Secondary | ICD-10-CM | POA: Diagnosis not present

## 2021-01-15 DIAGNOSIS — M5441 Lumbago with sciatica, right side: Secondary | ICD-10-CM | POA: Diagnosis not present

## 2021-01-15 MED ORDER — GABAPENTIN 300 MG PO CAPS: 300.0000 mg | ORAL_CAPSULE | Freq: Three times a day (TID) | ORAL | 11 refills | Status: DC

## 2021-01-15 NOTE — Procedures (Signed)
Full Name: Michalene Debruler Gender: Female MRN #: [id number] Date of Birth: 05/28/1941    Visit Date: 01/15/2021 09:37 Age: 80 Years Examining Physician: Marcial Pacas, MD  Referring Physician: Marcial Pacas, MD History: 80 year old female complains of frequent low back pain, bilateral lower extremity paresthesia  Summary of the test: Nerve conduction study: Bilateral sural, superficial peroneal sensory responses were normal.  Right tibial, bilateral peroneal to EDB motor responses were normal.  Left tibial motor response showed mildly decreased CMAP amplitude  Electromyography: Selected needle examinations were performed at bilateral lower extremity muscles and bilateral lumbosacral paraspinal muscles.  The only abnormality is mild chronic neuropathic changes involving left vastus lateralis, also evidence of increased insertional activity at the left lower lumbar paraspinal muscles.   Conclusion: This is a mild abnormal study.  There is electrodiagnostic evidence of mild chronic left lumbosacral radiculopathy mainly involving left L3-4 myotomes.  There is no evidence of active process.  There is no evidence of large fiber peripheral neuropathy    ------------------------------- Marcial Pacas M.D. PhD  [facility] [address] [place], [place] [address] Tel: [phone] Fax: [phone]  Verbal informed consent was obtained from the patient, patient was informed of potential risk of procedure, including bruising, bleeding, hematoma formation, infection, muscle weakness, muscle pain, numbness, among others.        [place]    Nerve / Sites Muscle Latency Ref. Amplitude Ref. Rel Amp Segments Distance Velocity Ref. Area    ms ms mV mV %  cm m/s m/s mVms  R Peroneal - EDB     Ankle EDB 4.0 ?6.5 4.2 ?2.0 100 Ankle - EDB 9   11.7     Fib head EDB 9.6  3.4  81.4 Fib head - Ankle 25 45 ?44 10.0     Pop fossa EDB 11.8  2.9  85.1 Pop fossa - Fib head 10 45 ?44  8.4         Pop fossa - Ankle      L Peroneal - EDB     Ankle EDB 4.1 ?6.5 5.1 ?2.0 100 Ankle - EDB 9   16.1     Fib head EDB 9.8  4.3  83.4 Fib head - Ankle 25 44 ?44 14.7     Pop fossa EDB 12.1  3.8  89.4 Pop fossa - Fib head 10 44 ?44 16.7         Pop fossa - Ankle      R Tibial - AH     Ankle AH 4.2 ?5.8 6.0 ?4.0 100 Ankle - AH 9   15.5     Pop fossa AH 13.1  3.8  63.8 Pop fossa - Ankle 36 41 ?41 17.2  L Tibial - AH     Ankle AH 3.2 ?5.8 3.1 ?4.0 100 Ankle - AH 9   10.7     Pop fossa AH 14.3  3.0  96.6 Pop fossa - Ankle 36 32 ?41 7.4             SNC    Nerve / Sites Rec. Site Peak Lat Ref.  Amp Ref. Segments Distance    ms ms V V  cm  R Sural - Ankle (Calf)     Calf Ankle 3.8 ?4.4 11 ?6 Calf - Ankle 14  L Sural - Ankle (Calf)     Calf Ankle 3.9 ?4.4 7 ?6 Calf - Ankle 14  R Superficial peroneal -  Ankle     Lat leg Ankle 3.0 ?4.4 6 ?6 Lat leg - Ankle 14  L Superficial peroneal - Ankle     Lat leg Ankle 3.0 ?4.4 7 ?6 Lat leg - Ankle 14             F  Wave    Nerve F Lat Ref.   ms ms  R Tibial - AH 53.4 ?56.0  L Tibial - AH 57.9 ?56.0         EMG Summary Table    Spontaneous MUAP Recruitment  Muscle IA Fib PSW Fasc Other Amp Dur. Poly Pattern  R. Tibialis anterior Normal None None None _______ Normal Normal Normal Normal  R. Tibialis posterior Normal None None None _______ Normal Normal Normal Normal  R. Peroneus longus Normal None None None _______ Normal Normal Normal Normal  R. Vastus lateralis Normal None None None _______ Normal Normal Normal Normal  L. Vastus lateralis Normal None None None _______ Normal Normal Normal Normal  L. Tibialis anterior Normal None None None _______ Normal Normal Normal Normal  L. Tibialis posterior Normal None None None _______ Normal Normal Normal Normal  L. Peroneus longus Normal None None None _______ Normal Normal Normal Normal  R. Lumbar paraspinals (mid) Normal None None None _______ Normal Normal Normal Normal  R. Lumbar  paraspinals (low) Normal None None None _______ Normal Normal Normal Normal  L. Lumbar paraspinals (low) Normal None None None _______ Normal Normal Normal Normal  L. Lumbar paraspinals (mid) Normal None None None _______ Normal Normal Normal Normal

## 2021-02-04 ENCOUNTER — Ambulatory Visit: Payer: Medicare Other | Admitting: Neurology

## 2021-02-21 DIAGNOSIS — Z1231 Encounter for screening mammogram for malignant neoplasm of breast: Secondary | ICD-10-CM | POA: Diagnosis not present

## 2021-02-24 ENCOUNTER — Other Ambulatory Visit: Payer: Self-pay | Admitting: Neurology

## 2021-03-25 ENCOUNTER — Other Ambulatory Visit: Payer: Self-pay | Admitting: Neurology

## 2021-03-27 DIAGNOSIS — H35033 Hypertensive retinopathy, bilateral: Secondary | ICD-10-CM | POA: Diagnosis not present

## 2021-03-27 DIAGNOSIS — H40011 Open angle with borderline findings, low risk, right eye: Secondary | ICD-10-CM | POA: Diagnosis not present

## 2021-03-27 DIAGNOSIS — H18513 Endothelial corneal dystrophy, bilateral: Secondary | ICD-10-CM | POA: Diagnosis not present

## 2021-03-27 DIAGNOSIS — H401221 Low-tension glaucoma, left eye, mild stage: Secondary | ICD-10-CM | POA: Diagnosis not present

## 2021-03-31 NOTE — Progress Notes (Signed)
I, [name], LAT, ATC, am serving as scribe for Dr. [name].  Melissa Maldonado is a 80 y.o. female who presents to [place] at [facility] today for f/u of R shoulder pain.  She was last seen by [name] on 01/13/21 for her R knee and R shoulder and noted con't improvement in her R shoulder pain after being d/c from PT.  She had a prior R subacromial steroid injection on 10/21/20.  Today, pt reports that her R shoulder pain has returned and has been bothering her for approximately 2 months.  She states that she is no longer doing her HEP.  She notes R shoulder pain radiating into her R proximal forearm.  Her most aggravating motions are R shoulder F and aBD.  Diagnostic imaging: R shoulder XR- 10/21/20  Pertinent review of systems: No fevers or chills  Relevant historical information: History of multiple different orthopedic issues including back pain and sciatica knee pain hip pain.   Exam:  BP 120/70 (BP Location: Left Arm, Patient Position: Sitting, Cuff Size: Normal)   Pulse 64   Ht 5\' 5"  (1.651 m)   Wt 189 lb 12.8 oz (86.1 kg)   SpO2 96%   BMI 31.58 kg/m  General: Well Developed, well nourished, and in no acute distress.   MSK: Right shoulder normal-appearing Nontender. Range of motion abduction limited to 100 degrees by pain.  External rotation full internal rotation lumbar spine. Strength is intact within limits of motion. Positive impingement testing. Pulses cap refill and sensation are intact distally.    Lab and Radiology Results  Procedure: Real-time Ultrasound Guided Injection of shoulder subacromial bursa Device: Philips Affiniti 50G Images permanently stored and available for review in PACS Ultrasound evaluation prior to injection reveals intact rotator cuff tendons and moderate subacromial bursitis. Verbal informed consent obtained.  Discussed risks and benefits of procedure. Warned about infection bleeding damage to structures skin  hypopigmentation and fat atrophy among others. Patient expresses understanding and agreement Time-out conducted.   Noted no overlying erythema, induration, or other signs of local infection.   Skin prepped in a sterile fashion.   Local anesthesia: Topical Ethyl chloride.   With sterile technique and under real time ultrasound guidance: 40 mg of Kenalog and 2 mL of Marcaine injected into subacromial bursa. Fluid seen entering the bursa.   Completed without difficulty   Pain immediately resolved suggesting accurate placement of the medication.   Advised to call if fevers/chills, erythema, induration, drainage, or persistent bleeding.   Images permanently stored and available for review in the ultrasound unit.  Impression: Technically successful ultrasound guided injection.         Assessment and Plan: 80 y.o. female with right shoulder pain due to recurrent subacromial bursitis and rotator cuff tendinopathy.  Plan for repeat subacromial injection.  Last injection was in April of this year.  Patient had good benefit with physical therapy last time.  When asked about at this time she would like to try home exercise program first.  She I think has plenty of experience and I think will do well with home exercise program.  She had dedicated reteaching with home exercise program with an athletic trainer in clinic today as noted below. If not improving she will let me know and I certainly could refer her back to dedicated physical therapy.  Additionally could proceed to MRI for potential surgical planning.  Recheck back as needed.   PDMP not reviewed this encounter. Orders Placed  This Encounter  Procedures   [place] LIMITED JOINT SPACE STRUCTURES UP RIGHT(NO LINKED CHARGES)    Order Specific Question:   Reason for Exam (SYMPTOM  OR DIAGNOSIS REQUIRED)    Answer:   R shoulder pain    Order Specific Question:   Preferred imaging location?    Answer:   [place]   No  orders of the defined types were placed in this encounter.    Discussed warning signs or symptoms. Please see discharge instructions. Patient expresses understanding.  The above documentation has been reviewed and is accurate and complete [name], M.D.   [phone]; 15 additional minutes spent for Therapeutic exercises as stated in above notes.  This included exercises focusing on stretching, strengthening, with significant focus on eccentric aspects.   Long term goals include an improvement in range of motion, strength, endurance as well as avoiding reinjury. Patient's frequency would include in 1-2 times a day, 3-5 times a week for a duration of 6-12 weeks.  Proper technique shown and discussed handout in great detail with ATC.  All questions were discussed and answered.

## 2021-04-01 ENCOUNTER — Encounter: Payer: Self-pay | Admitting: Family Medicine

## 2021-04-01 ENCOUNTER — Other Ambulatory Visit: Payer: Self-pay

## 2021-04-01 ENCOUNTER — Ambulatory Visit: Payer: Self-pay

## 2021-04-01 ENCOUNTER — Ambulatory Visit (INDEPENDENT_AMBULATORY_CARE_PROVIDER_SITE_OTHER): Payer: Medicare Other | Admitting: Family Medicine

## 2021-04-01 VITALS — BP 120/70 | HR 64 | Ht 65.0 in | Wt 189.8 lb

## 2021-04-01 DIAGNOSIS — M25511 Pain in right shoulder: Secondary | ICD-10-CM

## 2021-04-01 DIAGNOSIS — G8929 Other chronic pain: Secondary | ICD-10-CM

## 2021-04-01 NOTE — Patient Instructions (Addendum)
Good to see you today.  Please perform the exercise program that we have prepared for you and gone over in detail on a daily basis.  In addition to the handout you were provided you can access your program through: [email or web address]   Your unique program code is:  [facility]  Call or go to the ER if you develop a large red swollen joint with extreme pain or oozing puss.    If not improving let me know and I can refer you back to physical therapy.   Next step is not better is MRI

## 2021-04-04 ENCOUNTER — Other Ambulatory Visit: Payer: Self-pay | Admitting: Neurology

## 2021-04-22 DIAGNOSIS — I10 Essential (primary) hypertension: Secondary | ICD-10-CM | POA: Diagnosis not present

## 2021-04-24 DIAGNOSIS — E559 Vitamin D deficiency, unspecified: Secondary | ICD-10-CM | POA: Diagnosis not present

## 2021-04-24 DIAGNOSIS — I10 Essential (primary) hypertension: Secondary | ICD-10-CM | POA: Diagnosis not present

## 2021-04-24 DIAGNOSIS — Z0000 Encounter for general adult medical examination without abnormal findings: Secondary | ICD-10-CM | POA: Diagnosis not present

## 2021-04-29 DIAGNOSIS — E669 Obesity, unspecified: Secondary | ICD-10-CM | POA: Diagnosis not present

## 2021-04-29 DIAGNOSIS — Z01419 Encounter for gynecological examination (general) (routine) without abnormal findings: Secondary | ICD-10-CM | POA: Diagnosis not present

## 2021-04-29 DIAGNOSIS — N1832 Chronic kidney disease, stage 3b: Secondary | ICD-10-CM | POA: Diagnosis not present

## 2021-04-29 DIAGNOSIS — M5136 Other intervertebral disc degeneration, lumbar region: Secondary | ICD-10-CM | POA: Diagnosis not present

## 2021-04-29 DIAGNOSIS — I10 Essential (primary) hypertension: Secondary | ICD-10-CM | POA: Diagnosis not present

## 2021-04-29 DIAGNOSIS — Z23 Encounter for immunization: Secondary | ICD-10-CM | POA: Diagnosis not present

## 2021-04-29 DIAGNOSIS — M150 Primary generalized (osteo)arthritis: Secondary | ICD-10-CM | POA: Diagnosis not present

## 2021-04-29 DIAGNOSIS — R7989 Other specified abnormal findings of blood chemistry: Secondary | ICD-10-CM | POA: Diagnosis not present

## 2021-04-29 DIAGNOSIS — R768 Other specified abnormal immunological findings in serum: Secondary | ICD-10-CM | POA: Diagnosis not present

## 2021-04-29 DIAGNOSIS — Z0000 Encounter for general adult medical examination without abnormal findings: Secondary | ICD-10-CM | POA: Diagnosis not present

## 2021-04-29 DIAGNOSIS — Z6831 Body mass index (BMI) 31.0-31.9, adult: Secondary | ICD-10-CM | POA: Diagnosis not present

## 2021-04-29 DIAGNOSIS — M2550 Pain in unspecified joint: Secondary | ICD-10-CM | POA: Diagnosis not present

## 2021-05-02 DIAGNOSIS — N289 Disorder of kidney and ureter, unspecified: Secondary | ICD-10-CM | POA: Diagnosis not present

## 2021-05-02 DIAGNOSIS — I10 Essential (primary) hypertension: Secondary | ICD-10-CM | POA: Diagnosis not present

## 2021-05-02 DIAGNOSIS — R7989 Other specified abnormal findings of blood chemistry: Secondary | ICD-10-CM | POA: Diagnosis not present

## 2021-05-12 NOTE — Progress Notes (Signed)
I, [name], LAT, ATC, am serving as scribe for Dr. [name].  Oni L Dragan is a 80 y.o. female who presents to [place] at [facility] today for f/u of chronic R shoulder pain.  She was last seen by [name] on 04/01/21 and had a R subacromial injection.  She was also shown a HEP focusing on R RC and periscapular strengthening and was advised that if this did not go well then she would be referred to PT.  Today, pt reports R shoulder pain has worsened. Pt c/o a "coldness" that radiates from R shoulder to R forearm.    Additionally she notes right knee pain and swelling.  She had similar episode of pain back in May and was found to have medial compartment DJD on x-ray and had a steroid injection that worked until recently.  She denies any recent injury or significant change in activity.  Pain is worse with activity better with rest.  She is tried some over-the-counter medicines for pain which helped some.  Diagnostic imaging: R shoulder XR- 10/21/20  Pertinent review of systems: No fevers or chills  Relevant historical information: Chronic low back pain with multiple lumbar surgeries.  Hypertension.   Exam:  BP (!) 142/84   Pulse (!) 44   Ht 5\' 5"  (1.651 m)   Wt 196 lb 9.6 oz (89.2 kg)   SpO2 99%   BMI 32.72 kg/m  General: Well Developed, well nourished, and in no acute distress.   MSK: C-Spine: Normal-appearing Normal cervical motion.  Negative Spurling's test bilaterally.  Right shoulder: Normal-appearing Range of motion decreased abduction to 100 degrees.  External rotation full internal rotation lumbar spine.  Strength 4/5 abduction 4/5 external rotation 5/5 internal rotation.  Positive Hawkins and Neer's test.. Pulses cap refill and sensation are intact distally.  Right knee: Mild effusion Normal-appearing otherwise normal motion with crepitation.  Tender palpation medial joint line.     Lab and Radiology Results  Procedure: Real-time  Ultrasound Guided Injection of right knee superior lateral patellar space Device: Philips Affiniti 50G Images permanently stored and available for review in PACS Verbal informed consent obtained.  Discussed risks and benefits of procedure. Warned about infection bleeding damage to structures skin hypopigmentation and fat atrophy among others. Patient expresses understanding and agreement Time-out conducted.   Noted no overlying erythema, induration, or other signs of local infection.   Skin prepped in a sterile fashion.   Local anesthesia: Topical Ethyl chloride.   With sterile technique and under real time ultrasound guidance: 40 Milligrams of Kenalog and 2 mL of lidocaine injected into knee joint. Fluid seen entering the joint capsule.   Completed without difficulty   Pain immediately resolved suggesting accurate placement of the medication.   Advised to call if fevers/chills, erythema, induration, drainage, or persistent bleeding.   Images permanently stored and available for review in the ultrasound unit.  Impression: Technically successful ultrasound guided injection.    EXAM: RIGHT KNEE 3 VIEWS   COMPARISON:  None.   FINDINGS: Mild joint space loss and spurring of the medial compartment. Mild spurring of the lateral compartment. Mild joint space loss and spurring of the patellofemoral compartment. Minimal suprapatellar knee joint effusion.   IMPRESSION: Tricompartmental osteoarthrosis of the right knee.     Electronically Signed   By: Miachel Roux M.D.   On: 12/03/2020 10:10  I, [name], personally (independently) visualized and performed the interpretation of the images attached in this note.  Assessment and Plan: 80 y.o. female with right shoulder pain.  Thought to be subacromial impingement or rotator cuff tendinopathy.  Not improved significantly after last visit with subacromial injection and physician directed home exercise program x6 weeks.  This point  plan for MRI to further evaluate cause of pain and for potential injection or surgical planning.  Right knee pain due to exacerbation of DJD.  Plan for repeat steroid injection.  Last injection was May of this year.  Certainly could also proceed with hyaluronic acid injections if needed.  Recheck as needed.    PDMP not reviewed this encounter. Orders Placed This Encounter  Procedures   [place] LIMITED JOINT SPACE STRUCTURES LOW RIGHT(NO LINKED CHARGES)    Standing Status:   Future    Number of Occurrences:   1    Standing Expiration Date:   11/11/2021    Order Specific Question:   Reason for Exam (SYMPTOM  OR DIAGNOSIS REQUIRED)    Answer:   right knee pain    Order Specific Question:   Preferred imaging location?    Answer:   [place]   MR SHOULDER RIGHT WO CONTRAST    Chronic right shoulder pain    Standing Status:   Future    Standing Expiration Date:   05/13/2022    Order Specific Question:   What is the patient's sedation requirement?    Answer:   No Sedation    Order Specific Question:   Does the patient have a pacemaker or implanted devices?    Answer:   No    Order Specific Question:   Preferred imaging location?    Answer:   [facility] (table limit-550lbs)   No orders of the defined types were placed in this encounter.    Discussed warning signs or symptoms. Please see discharge instructions. Patient expresses understanding.   The above documentation has been reviewed and is accurate and complete [name], M.D.

## 2021-05-13 ENCOUNTER — Other Ambulatory Visit: Payer: Self-pay

## 2021-05-13 ENCOUNTER — Ambulatory Visit: Payer: Self-pay

## 2021-05-13 ENCOUNTER — Encounter: Payer: Self-pay | Admitting: Family Medicine

## 2021-05-13 ENCOUNTER — Ambulatory Visit (INDEPENDENT_AMBULATORY_CARE_PROVIDER_SITE_OTHER): Payer: Medicare Other | Admitting: Family Medicine

## 2021-05-13 VITALS — BP 142/84 | HR 44 | Ht 65.0 in | Wt 196.6 lb

## 2021-05-13 DIAGNOSIS — M25511 Pain in right shoulder: Secondary | ICD-10-CM | POA: Diagnosis not present

## 2021-05-13 DIAGNOSIS — G8929 Other chronic pain: Secondary | ICD-10-CM

## 2021-05-13 DIAGNOSIS — M25561 Pain in right knee: Secondary | ICD-10-CM

## 2021-05-13 NOTE — Patient Instructions (Addendum)
Thank you for coming in today.   I've ordered a MRI of your shoulder. If you don't hear about scheduling within 1 week, please let me know.  You received an injection today. Seek immediate medical attention if the joint becomes red, extremely painful, or is oozing fluid.   Recheck back after the MRI

## 2021-05-31 ENCOUNTER — Ambulatory Visit
Admission: RE | Admit: 2021-05-31 | Discharge: 2021-05-31 | Disposition: A | Discharge status: Home / Self Care | Payer: Medicare Other | Source: Ambulatory Visit | Attending: Family Medicine | Admitting: Family Medicine

## 2021-05-31 ENCOUNTER — Other Ambulatory Visit: Payer: Self-pay

## 2021-05-31 DIAGNOSIS — M25511 Pain in right shoulder: Secondary | ICD-10-CM

## 2021-05-31 DIAGNOSIS — G8929 Other chronic pain: Secondary | ICD-10-CM

## 2021-06-02 NOTE — Progress Notes (Signed)
MRI shows rotator cuff tears and severe tendinitis.  Please return to clinic to go over the results in full detail and discuss treatment plan and options .

## 2021-06-02 NOTE — Progress Notes (Signed)
I, Melissa Maldonado, LAT, ATC, am serving as scribe for Dr. Lynne Maldonado.  Melissa Maldonado is a 80 y.o. female who presents to [place] [facility] today for f/u of chronic R shoulder pain and R shoulder MRI review.  She was last seen by [name] on 05/13/21 and noted worsening R shoulder pain and was referred for a shoulder MRI.  She had a prior R subacromial steroid injection on 04/01/21.  She also c/o R knee pain and had a R knee steroid injection at her last visit.  Today, pt reports no relief from prior R subacromial steroid injection and her R shoulder is still hurting.  She also has continued pain in her right knee.  She had a steroid injection on October 25 which has not helped much.  She notes she is very apprehensive about injections and has needle phobia.  Diagnostic testing: R shoulder MRI- 05/31/21; R shoulder XR- 10/21/20  Pertinent review of systems: No fevers or chills  Relevant historical information: Hypertension   Exam:  BP (!) 158/84   Pulse (!) 49   Ht 5\' 5"  (1.651 m)   Wt 192 lb 6.4 oz (87.3 kg)   SpO2 99%   BMI 32.02 kg/m  General: Well Developed, well nourished, and in no acute distress.   MSK: Right shoulder: Normal-appearing decreased motion.  Pain with abduction.  Right knee: Mild effusion normal motion with crepitation.    Lab and Radiology Results No results found for this or any previous visit (from the past 72 hour(s)). MR SHOULDER RIGHT WO CONTRAST  Result Date: 05/31/2021 CLINICAL DATA:  Right shoulder pain and limited range of motion for the past 3 months. No injury or prior surgery. EXAM: MRI OF THE RIGHT SHOULDER WITHOUT CONTRAST TECHNIQUE: Multiplanar, multisequence MR imaging of the shoulder was performed. No intravenous contrast was administered. COMPARISON:  Right shoulder x-rays dated October 21, 2020. FINDINGS: Rotator cuff: Diffuse severe supraspinatus tendinosis with full-thickness tear of the anterior tendon proximal to  the insertion (series 6, image 8; series 3, image 11). Severe distal infraspinatus tendinosis with small intrasubstance tear. Moderate distal subscapularis tendinosis with small intrasubstance tear. The teres minor tendon is unremarkable. Muscles: No atrophy or abnormal signal of the muscles of the rotator cuff. Biceps long head: Severe tendinosis with medial subluxation. Small partial tear within the bicipital groove. Acromioclavicular Joint: Severe arthropathy of the acromioclavicular joint. Type II acromion. Small amount of fluid in the subacromial/subdeltoid bursa. Glenohumeral Joint: Small joint effusion. Mild partial-thickness cartilage loss. Degenerative subchondral marrow edema in the inferior glenoid. Labrum:  Diffusely degenerated and torn. Bones: No acute fracture or dislocation. No suspicious bone lesion. Other: None. IMPRESSION: 1. Diffuse severe supraspinatus tendinosis with full-thickness tear of the anterior tendon proximal to the insertion. 2. Severe distal infraspinatus tendinosis with small intrasubstance tear. 3. Moderate distal subscapularis tendinosis with small intrasubstance tear. 4. Severe biceps tendinosis with medial subluxation and small partial tear within the bicipital groove. 5. Severe acromioclavicular and mild glenohumeral osteoarthritis. Electronically Signed   By: Titus Dubin M.D.   On: 05/31/2021 11:27    EXAM: RIGHT KNEE 3 VIEWS   COMPARISON:  None.   FINDINGS: Mild joint space loss and spurring of the medial compartment. Mild spurring of the lateral compartment. Mild joint space loss and spurring of the patellofemoral compartment. Minimal suprapatellar knee joint effusion.   IMPRESSION: Tricompartmental osteoarthrosis of the right knee.     Electronically Signed   By: Sharen Heck  Mir  M.D.   On: 12/03/2020 10:10   I, Melissa Maldonado, personally (independently) visualized and performed the interpretation of the images attached in this note.    Assessment and  Plan: 80 y.o. female with  Right shoulder pain.  Multifactorial however patient has significant derangement in the shoulder including severe supraspinatus tendinopathy with a partial with full-thickness rotator cuff tear as well as significant tendinopathy with partial tears of the subscapularis and infraspinatus tendons as well as severe AC DJD.  Additionally she has significant proximal biceps tendinitis with a partial tear. I am not optimistic about her improving conservatively.  I think that she will require surgery and have referred her to orthopedic surgery for surgical consultation. Discussed the MRI results treatment plan and options and she agrees.  Is for her right knee pain this is due to exacerbation of DJD.  She is failing conservative management strategies with steroid injection.  We will work on authorization now for hyaluronic acid injection as neck step.  Given her needle phobia we will prescribe Emla cream to apply prior to the next injection.  Ultimately she may require knee replacement however her shoulder pain will require more urgent treatment and will have approximately 63-month recovery time after surgery.  We will need to buy her some time for now.   PDMP not reviewed this encounter. Orders Placed This Encounter  Procedures   Ambulatory referral to Orthopedic Surgery    Referral Priority:   Routine    Referral Type:   Surgical    Referral Reason:   Specialty Services Required    Requested Specialty:   Orthopedic Surgery    Number of Visits Requested:   1   Meds ordered this encounter  Medications   lidocaine-prilocaine (EMLA) cream    Sig: Apply 1 application topically as needed. Apply over the area 60 minutes prior to the injection and cover with plastic wrap.    Dispense:  30 g    Refill:  2     Discussed warning signs or symptoms. Please see discharge instructions. Patient expresses understanding.   The above documentation has been reviewed and is accurate  and complete Melissa Maldonado, M.D.   Total encounter time 30 minutes including face-to-face time with the patient and, reviewing past medical record, and charting on the date of service.   Reviewed MRI and discussed treatment plan and options for both the knee and the shoulder.

## 2021-06-03 ENCOUNTER — Ambulatory Visit (INDEPENDENT_AMBULATORY_CARE_PROVIDER_SITE_OTHER): Payer: Medicare Other | Admitting: Family Medicine

## 2021-06-03 ENCOUNTER — Other Ambulatory Visit: Payer: Self-pay

## 2021-06-03 VITALS — BP 158/84 | HR 49 | Ht 65.0 in | Wt 192.4 lb

## 2021-06-03 DIAGNOSIS — M25511 Pain in right shoulder: Secondary | ICD-10-CM | POA: Diagnosis not present

## 2021-06-03 DIAGNOSIS — M25561 Pain in right knee: Secondary | ICD-10-CM

## 2021-06-03 DIAGNOSIS — G8929 Other chronic pain: Secondary | ICD-10-CM | POA: Diagnosis not present

## 2021-06-03 DIAGNOSIS — M1711 Unilateral primary osteoarthritis, right knee: Secondary | ICD-10-CM | POA: Diagnosis not present

## 2021-06-03 MED ORDER — LIDOCAINE-PRILOCAINE 2.5-2.5 % EX CREA: 1.0000 "application " | TOPICAL_CREAM | CUTANEOUS | 2 refills | Status: DC | PRN

## 2021-06-03 NOTE — Patient Instructions (Addendum)
Thank you for coming in today.   Proceed to [facility] for surgical consultation.  We will work on authorizing the gel shots for your right knee. You will hear from my office about scheduling once approved.  I've sent the numbing cream to your pharmacy. Apply it over the area and cover with plastic wrap, 1 hour before the injection.

## 2021-06-05 DIAGNOSIS — H40011 Open angle with borderline findings, low risk, right eye: Secondary | ICD-10-CM | POA: Diagnosis not present

## 2021-06-05 DIAGNOSIS — H401221 Low-tension glaucoma, left eye, mild stage: Secondary | ICD-10-CM | POA: Diagnosis not present

## 2021-06-05 DIAGNOSIS — H18513 Endothelial corneal dystrophy, bilateral: Secondary | ICD-10-CM | POA: Diagnosis not present

## 2021-06-05 DIAGNOSIS — H35033 Hypertensive retinopathy, bilateral: Secondary | ICD-10-CM | POA: Diagnosis not present

## 2021-06-11 ENCOUNTER — Ambulatory Visit: Payer: Self-pay

## 2021-06-11 ENCOUNTER — Ambulatory Visit (INDEPENDENT_AMBULATORY_CARE_PROVIDER_SITE_OTHER): Payer: Medicare Other | Admitting: Family Medicine

## 2021-06-11 ENCOUNTER — Other Ambulatory Visit: Payer: Self-pay

## 2021-06-11 VITALS — Ht 65.0 in

## 2021-06-11 DIAGNOSIS — M25561 Pain in right knee: Secondary | ICD-10-CM | POA: Diagnosis not present

## 2021-06-11 DIAGNOSIS — G8929 Other chronic pain: Secondary | ICD-10-CM

## 2021-06-11 DIAGNOSIS — M1711 Unilateral primary osteoarthritis, right knee: Secondary | ICD-10-CM

## 2021-06-11 NOTE — Progress Notes (Signed)
Melissa Maldonado presents to clinic today for Synvisc injection right knee 1/3  Procedure: Real-time Ultrasound Guided Injection of right knee superior lateral patellar space Device: Philips Affiniti 50G Images permanently stored and available for review in PACS Verbal informed consent obtained.  Discussed risks and benefits of procedure. Warned about infection bleeding damage to structures skin hypopigmentation and fat atrophy among others. Patient expresses understanding and agreement Time-out conducted.   Noted no overlying erythema, induration, or other signs of local infection.   Skin prepped in a sterile fashion.   Local anesthesia: Topical Ethyl chloride.   With sterile technique and under real time ultrasound guidance: Synvisc 26ml injected into knee joint. Fluid seen entering the joint capsule.   Completed without difficulty   Advised to call if fevers/chills, erythema, induration, drainage, or persistent bleeding.   Images permanently stored and available for review in the ultrasound unit.  Impression: Technically successful ultrasound guided injection.  Lot number: Lot number [id number]  Return in 1 week for Synvisc injection right knee 2/3

## 2021-06-11 NOTE — Patient Instructions (Signed)
Schedule injections 2 and 3 on your way out

## 2021-06-16 DIAGNOSIS — I129 Hypertensive chronic kidney disease with stage 1 through stage 4 chronic kidney disease, or unspecified chronic kidney disease: Secondary | ICD-10-CM | POA: Diagnosis not present

## 2021-06-16 DIAGNOSIS — N184 Chronic kidney disease, stage 4 (severe): Secondary | ICD-10-CM | POA: Diagnosis not present

## 2021-06-16 DIAGNOSIS — D631 Anemia in chronic kidney disease: Secondary | ICD-10-CM | POA: Diagnosis not present

## 2021-06-16 DIAGNOSIS — N2581 Secondary hyperparathyroidism of renal origin: Secondary | ICD-10-CM | POA: Diagnosis not present

## 2021-06-17 ENCOUNTER — Ambulatory Visit (INDEPENDENT_AMBULATORY_CARE_PROVIDER_SITE_OTHER): Payer: Medicare Other | Admitting: Orthopaedic Surgery

## 2021-06-17 ENCOUNTER — Other Ambulatory Visit: Payer: Self-pay

## 2021-06-17 ENCOUNTER — Encounter: Payer: Self-pay | Admitting: Orthopaedic Surgery

## 2021-06-17 ENCOUNTER — Ambulatory Visit: Payer: Medicare Other | Admitting: Orthopaedic Surgery

## 2021-06-17 DIAGNOSIS — M7541 Impingement syndrome of right shoulder: Secondary | ICD-10-CM

## 2021-06-17 DIAGNOSIS — M75111 Incomplete rotator cuff tear or rupture of right shoulder, not specified as traumatic: Secondary | ICD-10-CM | POA: Diagnosis not present

## 2021-06-17 NOTE — Progress Notes (Signed)
Office Visit Note   Patient: Melissa Maldonado           Date of Birth: [birth year]           MRN: [id number] Visit Date: 06/17/2021              Requested by: [name], MD [place],  [place] [address] PCP: [name], MD   Assessment & Plan: Visit Diagnoses:  1. Nontraumatic incomplete tear of right rotator cuff   2. Impingement syndrome of right shoulder     Plan: MRI the right shoulder reveals diffuse severe tendinosis supraspinatus and infraspinatus.  There is a full-thickness component to the anterior portion of the supraspinatus.  She has severe's tendinosis of the biceps tendon with medial subluxation.  Enlarged distal clavicle consistent with severe arthropathy of the [facility] joint and a type II acromion.  There is mass-effect downward from the distal clavicle on the rotator cuff.  Given these findings and lack of relief from conservative management I recommended shoulder arthroscopy with extensive debridement, distal clavicle excision, subacromial decompression with possible rotator cuff repair.  Risk benefits rehab recovery reviewed with the patient detail.  Questions encouraged and answered.  She will call us back to schedule surgery as soon as she has an idea of when she wants to do it.  Follow-Up Instructions: No follow-ups on file.   Orders:  No orders of the defined types were placed in this encounter.  No orders of the defined types were placed in this encounter.     Procedures: No procedures performed   Clinical Data: No additional findings.   Subjective: Chief Complaint  Patient presents with   Right Shoulder - Pain    Patient is a very pleasant 80 year old female referral from [name] for surgical consultation of chronic right shoulder pain for months without any injuries.  She has undergone extensive conservative management with [name] with short-term pain relief from subacromial injection.  She is right-hand dominant.  She is still  quite active.  She has trouble raising her arm above shoulder level.  Denies any numbness and tingling or radicular symptoms.  Feels pain in the anterior and lateral arm.   Review of Systems  Constitutional: Negative.   HENT: Negative.    Eyes: Negative.   Respiratory: Negative.    Cardiovascular: Negative.   Endocrine: Negative.   Musculoskeletal: Negative.   Neurological: Negative.   Hematological: Negative.   Psychiatric/Behavioral: Negative.    All other systems reviewed and are negative.   Objective: Vital Signs: There were no vitals taken for this visit.  Physical Exam Vitals and nursing note reviewed.  Constitutional:      Appearance: She is well-developed.  Pulmonary:     Effort: Pulmonary effort is normal.  Skin:    General: Skin is warm.     Capillary Refill: Capillary refill takes less than 2 seconds.  Neurological:     Mental Status: She is alert and oriented to person, place, and time.  Psychiatric:        Behavior: Behavior normal.        Thought Content: Thought content normal.        Judgment: Judgment normal.    Ortho Exam  Right shoulder shows near normal passive range of motion with moderate to severe pain.  Active flexion to 90 degrees with moderate pain.  Positive Hawkins, cross body adduction, Speed.  There is moderate pain and 4 out of 5 strength with  empty can testing.  Specialty Comments:  No specialty comments available.  Imaging: No results found.   PMFS History: Patient Active Problem List   Diagnosis Date Noted   Paresthesia 12/05/2020   Low back pain with right-sided sciatica 12/05/2020   Trochanteric bursitis, right hip 04/24/2020   Essential hypertension 01/12/2020   Sinus bradycardia 01/12/2020   Dyspnea on exertion 01/12/2020   Lumbar stenosis with neurogenic claudication 02/01/2017   Osteoarthritis of left knee 04/27/2014   Tear of medial meniscus of left knee 09/08/2013   Past Medical History:  Diagnosis Date    Arthritis    Chronic back pain    lumbar stenosis   Early cataracts, bilateral    GERD (gastroesophageal reflux disease)    occasionally but no meds required   Headache(784.0)    occasionally   History of blood transfusion    History of colon polyps    Hyperlipidemia    takes Lipitor every other day   Hypertension    takes losartan daily   Neuropathy    Osteoarthritis of left knee 04/27/2014   Restless leg    Shortness of breath    with exertion   SOB (shortness of breath) 2013   CPET   Tear of medial meniscus of left knee 09/08/2013    Family History  Problem Relation Age of Onset   Lupus Mother    Heart attack Father     Past Surgical History:  Procedure Laterality Date   APPENDECTOMY     BACK SURGERY  2010   lumb fusion   COLONOSCOPY     EYE SURGERY     both cataracts   KNEE ARTHROSCOPY WITH MEDIAL MENISECTOMY Left 09/08/2013   Procedure: LEFT KNEE ARTHROSCOPY WITH PARTIAL MEDIAL MENISCECTOMY;  Surgeon: [name], MD;  Location: [place];  Service: Orthopedics;  Laterality: Left;   LUMBAR LAMINECTOMY/DECOMPRESSION MICRODISCECTOMY  05/13/2012   Procedure: LUMBAR LAMINECTOMY/DECOMPRESSION MICRODISCECTOMY 1 LEVEL;  Surgeon: [name], MD;  Location: [place] [facility];  Service: Neurosurgery;  Laterality: Bilateral;  Bilateral Lumbar one -two Decompressive Laminectomy   PARTIAL KNEE ARTHROPLASTY Left 04/27/2014   Procedure: LEFT UNICOMPARTMENTAL KNEE;  Surgeon: [name], MD;  Location: [place];  Service: Orthopedics;  Laterality: Left;   UPPER GI ENDOSCOPY     Social History   Occupational History   Occupation: Retired  Tobacco Use   Smoking status: Never   Smokeless tobacco: Never  Substance and Sexual Activity   Alcohol use: Yes    Comment: socially wine   Drug use: Never   Sexual activity: Yes    Birth control/protection: Post-menopausal

## 2021-06-18 ENCOUNTER — Ambulatory Visit: Payer: Self-pay

## 2021-06-18 ENCOUNTER — Other Ambulatory Visit: Payer: Self-pay | Admitting: Nephrology

## 2021-06-18 ENCOUNTER — Ambulatory Visit (INDEPENDENT_AMBULATORY_CARE_PROVIDER_SITE_OTHER): Payer: Medicare Other | Admitting: Family Medicine

## 2021-06-18 DIAGNOSIS — I129 Hypertensive chronic kidney disease with stage 1 through stage 4 chronic kidney disease, or unspecified chronic kidney disease: Secondary | ICD-10-CM

## 2021-06-18 DIAGNOSIS — M1711 Unilateral primary osteoarthritis, right knee: Secondary | ICD-10-CM

## 2021-06-18 DIAGNOSIS — G8929 Other chronic pain: Secondary | ICD-10-CM

## 2021-06-18 DIAGNOSIS — N184 Chronic kidney disease, stage 4 (severe): Secondary | ICD-10-CM

## 2021-06-18 DIAGNOSIS — M25561 Pain in right knee: Secondary | ICD-10-CM | POA: Diagnosis not present

## 2021-06-18 NOTE — Progress Notes (Signed)
Bruce presents to clinic today for Gelsyn injection right knee 2/3  Procedure: Real-time Ultrasound Guided Injection of right knee superior lateral patellar space Device: Philips Affiniti 50G Images permanently stored and available for review in PACS Verbal informed consent obtained.  Discussed risks and benefits of procedure. Warned about infection bleeding damage to structures skin hypopigmentation and fat atrophy among others. Patient expresses understanding and agreement Time-out conducted.   Noted no overlying erythema, induration, or other signs of local infection.   Skin prepped in a sterile fashion.   Local anesthesia: Topical Ethyl chloride.   With sterile technique and under real time ultrasound guidance: Gelsyn 16.8 mg injected into knee joint. Fluid seen entering the joint capsule.   Completed without difficulty   Advised to call if fevers/chills, erythema, induration, drainage, or persistent bleeding.   Images permanently stored and available for review in the ultrasound unit.  Impression: Technically successful ultrasound guided injection.  Lot number: [id number]  Return in 1 week for Gelsyn injection right knee 3/3

## 2021-06-18 NOTE — Patient Instructions (Signed)
Thank you for coming in today.   You received the 2nd injection in your right knee today. Seek immediate medical attention if the joint becomes red, extremely painful, or is oozing fluid.   We will see you next week for the 3rd gel injection.

## 2021-06-25 ENCOUNTER — Other Ambulatory Visit: Payer: Medicare Other

## 2021-06-25 ENCOUNTER — Ambulatory Visit (INDEPENDENT_AMBULATORY_CARE_PROVIDER_SITE_OTHER): Payer: Medicare Other | Admitting: Family Medicine

## 2021-06-25 ENCOUNTER — Other Ambulatory Visit: Payer: Self-pay

## 2021-06-25 ENCOUNTER — Ambulatory Visit: Payer: Self-pay

## 2021-06-25 DIAGNOSIS — M25561 Pain in right knee: Secondary | ICD-10-CM

## 2021-06-25 DIAGNOSIS — G8929 Other chronic pain: Secondary | ICD-10-CM

## 2021-06-25 DIAGNOSIS — M1711 Unilateral primary osteoarthritis, right knee: Secondary | ICD-10-CM

## 2021-06-25 NOTE — Progress Notes (Signed)
Melissa Maldonado presents to clinic today for Gelsyn injection right knee 3/3  Procedure: Real-time Ultrasound Guided Injection of right knee superior lateral patellar space Device: Philips Affiniti 50G Images permanently stored and available for review in PACS Verbal informed consent obtained.  Discussed risks and benefits of procedure. Warned about infection bleeding damage to structures skin hypopigmentation and fat atrophy among others. Patient expresses understanding and agreement Time-out conducted.   Noted no overlying erythema, induration, or other signs of local infection.   Skin prepped in a sterile fashion.   Local anesthesia: Topical Ethyl chloride.   With sterile technique and under real time ultrasound guidance: Gelsyn 16.8 mg injected into knee joint. Fluid seen entering the joint capsule.   Completed without difficulty   Advised to call if fevers/chills, erythema, induration, drainage, or persistent bleeding.   Images permanently stored and available for review in the ultrasound unit.  Impression: Technically successful ultrasound guided injection.  Lot number: [id number]  Return as needed

## 2021-06-25 NOTE — Patient Instructions (Signed)
Thank you for coming in today.   You received a gel injection, Gelsyn 3/3 today. Seek immediate medical attention if the joint becomes red, extremely painful, or is oozing fluid.   Recheck back as needed

## 2021-06-27 ENCOUNTER — Ambulatory Visit
Admission: RE | Admit: 2021-06-27 | Discharge: 2021-06-27 | Disposition: A | Discharge status: Home / Self Care | Payer: Medicare Other | Source: Ambulatory Visit | Attending: Nephrology | Admitting: Nephrology

## 2021-06-27 DIAGNOSIS — N189 Chronic kidney disease, unspecified: Secondary | ICD-10-CM | POA: Diagnosis not present

## 2021-06-27 DIAGNOSIS — N184 Chronic kidney disease, stage 4 (severe): Secondary | ICD-10-CM

## 2021-06-27 DIAGNOSIS — I129 Hypertensive chronic kidney disease with stage 1 through stage 4 chronic kidney disease, or unspecified chronic kidney disease: Secondary | ICD-10-CM

## 2021-07-01 ENCOUNTER — Other Ambulatory Visit: Payer: Self-pay | Admitting: Nephrology

## 2021-07-01 DIAGNOSIS — N2889 Other specified disorders of kidney and ureter: Secondary | ICD-10-CM

## 2021-07-07 ENCOUNTER — Ambulatory Visit
Admission: RE | Admit: 2021-07-07 | Discharge: 2021-07-07 | Disposition: A | Discharge status: Home / Self Care | Payer: Medicare Other | Source: Ambulatory Visit | Attending: Nephrology | Admitting: Nephrology

## 2021-07-07 ENCOUNTER — Other Ambulatory Visit: Payer: Self-pay | Admitting: Nephrology

## 2021-07-07 ENCOUNTER — Other Ambulatory Visit: Payer: Self-pay

## 2021-07-07 DIAGNOSIS — N2889 Other specified disorders of kidney and ureter: Secondary | ICD-10-CM

## 2021-07-29 ENCOUNTER — Other Ambulatory Visit: Payer: Self-pay | Admitting: Nephrology

## 2021-07-29 DIAGNOSIS — N2889 Other specified disorders of kidney and ureter: Secondary | ICD-10-CM

## 2021-08-08 DIAGNOSIS — N184 Chronic kidney disease, stage 4 (severe): Secondary | ICD-10-CM | POA: Diagnosis not present

## 2021-08-08 DIAGNOSIS — N289 Disorder of kidney and ureter, unspecified: Secondary | ICD-10-CM | POA: Diagnosis not present

## 2021-08-08 DIAGNOSIS — I10 Essential (primary) hypertension: Secondary | ICD-10-CM | POA: Diagnosis not present

## 2021-08-09 ENCOUNTER — Ambulatory Visit
Admission: RE | Admit: 2021-08-09 | Discharge: 2021-08-09 | Disposition: A | Discharge status: Home / Self Care | Payer: Medicare Other | Source: Ambulatory Visit | Attending: Nephrology | Admitting: Nephrology

## 2021-08-09 ENCOUNTER — Other Ambulatory Visit: Payer: Self-pay

## 2021-08-09 DIAGNOSIS — K5730 Diverticulosis of large intestine without perforation or abscess without bleeding: Secondary | ICD-10-CM | POA: Diagnosis not present

## 2021-08-09 DIAGNOSIS — Z9889 Other specified postprocedural states: Secondary | ICD-10-CM | POA: Diagnosis not present

## 2021-08-09 DIAGNOSIS — N2889 Other specified disorders of kidney and ureter: Secondary | ICD-10-CM

## 2021-08-09 MED ORDER — GADOBENATE DIMEGLUMINE 529 MG/ML IV SOLN
18.0000 mL | Freq: Once | INTRAVENOUS | Status: AC | PRN
  Administered 2021-08-09: 18 mL via INTRAVENOUS

## 2021-08-19 ENCOUNTER — Encounter: Payer: Self-pay | Admitting: Orthopaedic Surgery

## 2021-08-19 DIAGNOSIS — M7541 Impingement syndrome of right shoulder: Secondary | ICD-10-CM | POA: Insufficient documentation

## 2021-08-19 DIAGNOSIS — M75111 Incomplete rotator cuff tear or rupture of right shoulder, not specified as traumatic: Secondary | ICD-10-CM | POA: Insufficient documentation

## 2021-08-21 ENCOUNTER — Other Ambulatory Visit: Payer: Self-pay | Admitting: Physician Assistant

## 2021-08-21 MED ORDER — HYDROCODONE-ACETAMINOPHEN 5-325 MG PO TABS: 1.0000 | ORAL_TABLET | Freq: Three times a day (TID) | ORAL | 0 refills | Status: DC | PRN

## 2021-08-21 MED ORDER — ONDANSETRON HCL 4 MG PO TABS: 4.0000 mg | ORAL_TABLET | Freq: Three times a day (TID) | ORAL | 0 refills | Status: DC | PRN

## 2021-08-26 ENCOUNTER — Other Ambulatory Visit: Payer: Self-pay | Admitting: Physician Assistant

## 2021-08-28 ENCOUNTER — Encounter: Payer: Self-pay | Admitting: Orthopaedic Surgery

## 2021-08-28 ENCOUNTER — Other Ambulatory Visit: Payer: Self-pay | Admitting: Orthopaedic Surgery

## 2021-08-28 DIAGNOSIS — M75111 Incomplete rotator cuff tear or rupture of right shoulder, not specified as traumatic: Secondary | ICD-10-CM | POA: Diagnosis not present

## 2021-08-28 DIAGNOSIS — M19011 Primary osteoarthritis, right shoulder: Secondary | ICD-10-CM | POA: Diagnosis not present

## 2021-08-28 DIAGNOSIS — M24111 Other articular cartilage disorders, right shoulder: Secondary | ICD-10-CM | POA: Diagnosis not present

## 2021-08-28 DIAGNOSIS — G8918 Other acute postprocedural pain: Secondary | ICD-10-CM | POA: Diagnosis not present

## 2021-08-28 DIAGNOSIS — M67813 Other specified disorders of tendon, right shoulder: Secondary | ICD-10-CM | POA: Diagnosis not present

## 2021-08-28 DIAGNOSIS — M7541 Impingement syndrome of right shoulder: Secondary | ICD-10-CM | POA: Diagnosis not present

## 2021-08-28 DIAGNOSIS — M659 Synovitis and tenosynovitis, unspecified: Secondary | ICD-10-CM | POA: Diagnosis not present

## 2021-08-28 MED ORDER — HYDROCODONE-ACETAMINOPHEN 5-325 MG PO TABS: 1.0000 | ORAL_TABLET | Freq: Three times a day (TID) | ORAL | 0 refills | Status: DC | PRN

## 2021-09-05 ENCOUNTER — Other Ambulatory Visit: Payer: Self-pay

## 2021-09-05 ENCOUNTER — Ambulatory Visit (INDEPENDENT_AMBULATORY_CARE_PROVIDER_SITE_OTHER): Payer: Medicare Other | Admitting: Physician Assistant

## 2021-09-05 DIAGNOSIS — Z9889 Other specified postprocedural states: Secondary | ICD-10-CM

## 2021-09-05 MED ORDER — TRAMADOL HCL 50 MG PO TABS: 50.0000 mg | ORAL_TABLET | Freq: Two times a day (BID) | ORAL | 2 refills | Status: DC | PRN

## 2021-09-05 NOTE — Progress Notes (Signed)
Post-Op Visit Note   Patient: Melissa Maldonado           Date of Birth: 02/21/1941           MRN: [id number] Visit Date: 09/05/2021 PCP: [name], MD   Assessment & Plan:  Chief Complaint:  Chief Complaint  Patient presents with   Right Shoulder - Routine Post Op   Visit Diagnoses:  1. S/P arthroscopy of right shoulder     Plan: Patient is a pleasant 81 year old female who comes in today 1 week status post right shoulder arthroscopic debridement, decompression biceps tenotomy 08/28/2021.  She has been doing relatively well.  she has been taking oxycodone for pain but does she has been constipated.  She has been taking stool softeners but not any laxatives.  Examination of her right shoulder reveals well healed surgical portals with nylon sutures in place.  No evidence of infection or cellulitis.  She is neurovascular intact distally.  Today, sutures were removed and Steri-Strips applied.  Intraoperative pictures reviewed.  I have made a referral to physical therapy to begin per acromioplasty protocol.  I also recommended that she pick up a bottle of magnesium citrate.  She will call us if this does not resolve her problem.  Follow-up with [place] in 5 weeks time otherwise.  Call with concerns or questions.  Follow-Up Instructions: Return in about 5 weeks (around 10/10/2021).   Orders:  No orders of the defined types were placed in this encounter.  No orders of the defined types were placed in this encounter.   Imaging: No new imaging  PMFS History: Patient Active Problem List   Diagnosis Date Noted   Arthritis of right acromioclavicular joint 08/28/2021   Impingement syndrome of right shoulder 08/19/2021   Nontraumatic incomplete tear of right rotator cuff 08/19/2021   Paresthesia 12/05/2020   Low back pain with right-sided sciatica 12/05/2020   Trochanteric bursitis, right hip 04/24/2020   Essential hypertension 01/12/2020   Sinus bradycardia 01/12/2020   Dyspnea on exertion  01/12/2020   Lumbar stenosis with neurogenic claudication 02/01/2017   Osteoarthritis of left knee 04/27/2014   Tear of medial meniscus of left knee 09/08/2013   Past Medical History:  Diagnosis Date   Arthritis    Chronic back pain    lumbar stenosis   Early cataracts, bilateral    GERD (gastroesophageal reflux disease)    occasionally but no meds required   Headache(784.0)    occasionally   History of blood transfusion    History of colon polyps    Hyperlipidemia    takes Lipitor every other day   Hypertension    takes losartan daily   Neuropathy    Osteoarthritis of left knee 04/27/2014   Restless leg    Shortness of breath    with exertion   SOB (shortness of breath) 2013   CPET   Tear of medial meniscus of left knee 09/08/2013    Family History  Problem Relation Age of Onset   Lupus Mother    Heart attack Father     Past Surgical History:  Procedure Laterality Date   APPENDECTOMY     BACK SURGERY  2010   lumb fusion   COLONOSCOPY     EYE SURGERY     both cataracts   KNEE ARTHROSCOPY WITH MEDIAL MENISECTOMY Left 09/08/2013   Procedure: LEFT KNEE ARTHROSCOPY WITH PARTIAL MEDIAL MENISCECTOMY;  Surgeon: [name], MD;  Location: [place];  Service: Orthopedics;  Laterality: Left;   LUMBAR LAMINECTOMY/DECOMPRESSION MICRODISCECTOMY  05/13/2012   Procedure: LUMBAR LAMINECTOMY/DECOMPRESSION MICRODISCECTOMY 1 LEVEL;  Surgeon: [name], MD;  Location: [place] [facility];  Service: Neurosurgery;  Laterality: Bilateral;  Bilateral Lumbar one -two Decompressive Laminectomy   PARTIAL KNEE ARTHROPLASTY Left 04/27/2014   Procedure: LEFT UNICOMPARTMENTAL KNEE;  Surgeon: [name], MD;  Location: [place];  Service: Orthopedics;  Laterality: Left;   UPPER GI ENDOSCOPY     Social History   Occupational History   Occupation: Retired  Tobacco Use   Smoking status: Never   Smokeless tobacco: Never  Substance and Sexual Activity    Alcohol use: Yes    Comment: socially wine   Drug use: Never   Sexual activity: Yes    Birth control/protection: Post-menopausal

## 2021-09-11 DIAGNOSIS — N2581 Secondary hyperparathyroidism of renal origin: Secondary | ICD-10-CM | POA: Diagnosis not present

## 2021-09-11 DIAGNOSIS — N184 Chronic kidney disease, stage 4 (severe): Secondary | ICD-10-CM | POA: Diagnosis not present

## 2021-09-11 DIAGNOSIS — I129 Hypertensive chronic kidney disease with stage 1 through stage 4 chronic kidney disease, or unspecified chronic kidney disease: Secondary | ICD-10-CM | POA: Diagnosis not present

## 2021-09-11 DIAGNOSIS — N2889 Other specified disorders of kidney and ureter: Secondary | ICD-10-CM | POA: Diagnosis not present

## 2021-09-11 DIAGNOSIS — D631 Anemia in chronic kidney disease: Secondary | ICD-10-CM | POA: Diagnosis not present

## 2021-09-16 NOTE — Therapy (Signed)
OUTPATIENT PHYSICAL THERAPY SHOULDER EVALUATION   Patient Name: Melissa Maldonado MRN: [id number] DOB:26-Mar-1941, 81 y.o., female Today's Date: 09/17/2021   PT End of Session - 09/17/21 1540     Visit Number 1    Number of Visits 17    Date for PT Re-Evaluation 11/12/21    Authorization Type MCR    Authorization Time Period FOTO v6, v10; KX Mod v15    Progress Note Due on Visit 10    PT Start Time 1525    PT Stop Time 1610    PT Time Calculation (min) 45 min    Activity Tolerance Patient tolerated treatment well    Behavior During Therapy WFL for tasks assessed/performed             Past Medical History:  Diagnosis Date   Arthritis    Chronic back pain    lumbar stenosis   Early cataracts, bilateral    GERD (gastroesophageal reflux disease)    occasionally but no meds required   Headache(784.0)    occasionally   History of blood transfusion    History of colon polyps    Hyperlipidemia    takes Lipitor every other day   Hypertension    takes losartan daily   Neuropathy    Osteoarthritis of left knee 04/27/2014   Restless leg    Shortness of breath    with exertion   SOB (shortness of breath) 2013   CPET   Tear of medial meniscus of left knee 09/08/2013   Past Surgical History:  Procedure Laterality Date   APPENDECTOMY     BACK SURGERY  2010   lumb fusion   COLONOSCOPY     EYE SURGERY     both cataracts   KNEE ARTHROSCOPY WITH MEDIAL MENISECTOMY Left 09/08/2013   Procedure: LEFT KNEE ARTHROSCOPY WITH PARTIAL MEDIAL MENISCECTOMY;  Surgeon: [name], MD;  Location: [facility];  Service: Orthopedics;  Laterality: Left;   LUMBAR LAMINECTOMY/DECOMPRESSION MICRODISCECTOMY  05/13/2012   Procedure: LUMBAR LAMINECTOMY/DECOMPRESSION MICRODISCECTOMY 1 LEVEL;  Surgeon: [name], MD;  Location: [facility];  Service: Neurosurgery;  Laterality: Bilateral;  Bilateral Lumbar one -two Decompressive Laminectomy   PARTIAL KNEE ARTHROPLASTY Left 04/27/2014    Procedure: LEFT UNICOMPARTMENTAL KNEE;  Surgeon: [name], MD;  Location: [facility];  Service: Orthopedics;  Laterality: Left;   UPPER GI ENDOSCOPY     Patient Active Problem List   Diagnosis Date Noted   Arthritis of right acromioclavicular joint 08/28/2021   Impingement syndrome of right shoulder 08/19/2021   Nontraumatic incomplete tear of right rotator cuff 08/19/2021   Paresthesia 12/05/2020   Low back pain with right-sided sciatica 12/05/2020   Trochanteric bursitis, right hip 04/24/2020   Essential hypertension 01/12/2020   Sinus bradycardia 01/12/2020   Dyspnea on exertion 01/12/2020   Lumbar stenosis with neurogenic claudication 02/01/2017   Osteoarthritis of left knee 04/27/2014   Tear of medial meniscus of left knee 09/08/2013    PCP: [name], MD  REFERRING PROVIDER: Aundra Dubin, PA-C  REFERRING DIAG: [phone] (ICD-10-CM) - S/P arthroscopy of right shoulder  THERAPY DIAG:  Chronic right shoulder pain  Muscle weakness (generalized)   ONSET DATE: 08/28/2021  SUBJECTIVE:  SUBJECTIVE STATEMENT: Pt reports a hx of Rt shoulder pain lasting several months before she underwent a Rt shoulder debridement, SAD, and biceps tenotomy on 08/28/2021. She is 20 days s/p her surgery today. Currently, she does not have any any pain, but reports that she has pain when lifting her arm overhead. She reports that her worst pain over the past week was an 8/10. Easing factors include pain medication, ice, and rest. Aggravating factors include lifting arm overhead and quick movements. Pt denies N/T related to this problem. Red flag screening negative.  PERTINENT HISTORY: Rt shoulder debridement, SAD, and biceps tenotomy 08/28/2021 L1-L2 lumbar laminectomy/ decompression in  2013  PAIN:  Are you having pain? No NPRS scale: 0/10 Pain location: Rt shoulder PAIN TYPE: aching and sharp Pain description: intermittent  Aggravating factors: lifting arm overhead and quick movements Relieving factors: pain medication, ice, and rest  PRECAUTIONS: Shoulder; Rt shoulder biceps tenodemy ([facility])  WEIGHT BEARING RESTRICTIONS No  FALLS:  Has patient fallen in last 6 months? No Number of falls: 0  LIVING ENVIRONMENT: Lives with: lives with their family Lives in: House/apartment Stairs: Yes; Internal: 7 steps; on right going up, on left going up, and can reach both Has following equipment at home: None  OCCUPATION: Retired  PLOF: Independent  PATIENT GOALS: Yard work, washing hair, dressing, cooking  OBJECTIVE:   DIAGNOSTIC FINDINGS:  None after surgery.  Prior to surgery: 05/31/2021: MR Shoulder Rt without contrast: IMPRESSION: 1. Diffuse severe supraspinatus tendinosis with full-thickness tear of the anterior tendon proximal to the insertion. 2. Severe distal infraspinatus tendinosis with small intrasubstance tear. 3. Moderate distal subscapularis tendinosis with small intrasubstance tear. 4. Severe biceps tendinosis with medial subluxation and small partial tear within the bicipital groove. 5. Severe acromioclavicular and mild glenohumeral osteoarthritis.  PATIENT SURVEYS:  FOTO 53%, predicted 67% in 15 visits  COGNITION:  Overall cognitive status: Within functional limits for tasks assessed     SENSATION:  Light touch: Appears intact  POSTURE: Forward head, BIL rounded shoulders  UPPER EXTREMITY AROM/PROM:  A/PROM Right 09/17/2021 Left 09/17/2021  Shoulder flexion 80/110p! 180  Shoulder abduction 78/103p! 180  Shoulder internal rotation 50/65p! 90  Shoulder external rotation 50/60p! 80  Elbow flexion 130/130 130/130  Elbow extension 0/0 0/0  (Blank rows = not tested)  UPPER EXTREMITY MMT:  MMT  Right 09/17/2021 Left 09/17/2021  Shoulder flexion 3/5p! 4+/5  Shoulder abduction 3/5p! 4+/5  Shoulder internal rotation 4/5 5/5  Shoulder external rotation 3+/5p! 5/5  Middle trapezius    Latissimus Dorsi    Elbow flexion 4+/5 5/5  Elbow extension 5/5 5/5  Grip strength (lbs) 42 44  (Blank rows = not tested)   JOINT MOBILITY TESTING:  Mild hypomobility with minor pain with GHJ joint mobilization AP/PA/Inf  PALPATION:  TTP to surgical incisions at Rt shoulder   TODAY'S TREATMENT:  09/17/2021: Demonstrated and issued HEP   PATIENT EDUCATION: Education details: Pt educated on prognosis, POC, FOTO, HEP Person educated: Patient Education method: Consulting civil engineer, Media planner, and Handouts Education comprehension: verbalized understanding and returned demonstration   HOME EXERCISE PROGRAM: Access Code: [email or web address] URL: [email or web address] Date: 09/17/2021 Prepared by: Vanessa Harpster  Exercises Shoulder Scaption AAROM with Dowel - 2 x daily - 7 x weekly - 3 sets - 10 reps - 5-sec hold Standing Shoulder External Rotation AAROM with Dowel - 2 x daily - 7 x weekly - 3 sets - 10 reps - 5-sec hold Standing Shoulder Posterior Capsule Stretch - 2 x daily -  7 x weekly - 3 sets - 30sec hold Seated Scapular Retraction - 2 x daily - 7 x weekly - 3 sets - 10 reps - 5-sec hold   ASSESSMENT:  CLINICAL IMPRESSION: Patient is a 81 y.o. F who was seen today for physical therapy evaluation and treatment for Rt shoulder pain 20 days s/p Rt shoulder debridement, SAD, and biceps tenotomy. Upon assessment, her primary impairments include weak and painful gross Rt shoulder strength, limited and painful global Rt shoulder AROM, TTP to surgical incisions, and mildly hypomobile and painful Rt GHJ joint mobility. She will benefit from skilled PT to address her primary impairments and return to her prior level of function with less limitation. Will utilize [employer] and [facility] biceps  tenotomy protocol in lieu of no protocol being included in referral.   OBJECTIVE IMPAIRMENTS decreased ROM, decreased strength, hypomobility, impaired flexibility, impaired UE functional use, postural dysfunction, and pain.   ACTIVITY LIMITATIONS cleaning, community activity, driving, meal prep, laundry, yard work, and shopping.   PERSONAL FACTORS 1 comorbidity: HTN  are also affecting patient's functional outcome.    REHAB POTENTIAL: Good  CLINICAL DECISION MAKING: Stable/uncomplicated  EVALUATION COMPLEXITY: Low   GOALS: Goals reviewed with patient? Yes  SHORT TERM GOALS:  STG Name Target Date Goal status  1 Pt will report understanding and adherence to her HEP in order to promote independence in the management of her primary impairments. Baseline: HEP provided at eval 10/15/2021 INITIAL  2 Pt will achieve WNL global Rt shoulder AROM in order to get dressed with less limitation. Baseline: See AROM chart 10/15/2021 INITIAL   LONG TERM GOALS:   LTG Name Target Date Goal status  1 Pt will achieve a FOTO score of 67% in order to demonstrate improved functional ability as it relates to her shoulder symptoms. Baseline: 53% 11/12/2021 INITIAL  2 Pt will achieve global Rt shoulder MMT of 4+/5 in order to do heavy yard work with less limitation. Baseline: See MMT chart 11/12/2021 INITIAL  3 Pt will demonstrate ability to lift 5# weight into overhead cabinets with her Rt UE in order to put dishes away with less limitation. Baseline: Unable per post-op protocol 11/12/2021 INITIAL   PLAN: PT FREQUENCY: 2x/week  PT DURATION: 8 weeks  PLANNED INTERVENTIONS: Therapeutic exercises, Therapeutic activity, Neuromuscular re-education, Patient/Family education, Joint mobilization, Dry Needling, Electrical stimulation, Spinal mobilization, Cryotherapy, Moist heat, Taping, Vasopneumatic device, Manual therapy, and Neuro Muscular re-education  PLAN FOR NEXT SESSION: Progress Rt shoulder AROM per  post-op protocol, introduce early strengthening at appropriate intervals   Vanessa [place], PT, DPT 09/17/21 4:40 PM

## 2021-09-17 ENCOUNTER — Other Ambulatory Visit: Payer: Self-pay

## 2021-09-17 ENCOUNTER — Ambulatory Visit: Payer: Medicare Other | Attending: Physician Assistant

## 2021-09-17 DIAGNOSIS — G8929 Other chronic pain: Secondary | ICD-10-CM | POA: Diagnosis not present

## 2021-09-17 DIAGNOSIS — Z9889 Other specified postprocedural states: Secondary | ICD-10-CM | POA: Diagnosis not present

## 2021-09-17 DIAGNOSIS — M6281 Muscle weakness (generalized): Secondary | ICD-10-CM

## 2021-09-17 DIAGNOSIS — M25511 Pain in right shoulder: Secondary | ICD-10-CM | POA: Diagnosis not present

## 2021-09-23 ENCOUNTER — Ambulatory Visit: Payer: Medicare Other

## 2021-09-23 ENCOUNTER — Other Ambulatory Visit: Payer: Self-pay

## 2021-09-23 DIAGNOSIS — M25511 Pain in right shoulder: Secondary | ICD-10-CM

## 2021-09-23 DIAGNOSIS — M6281 Muscle weakness (generalized): Secondary | ICD-10-CM | POA: Diagnosis not present

## 2021-09-23 DIAGNOSIS — G8929 Other chronic pain: Secondary | ICD-10-CM

## 2021-09-23 DIAGNOSIS — Z9889 Other specified postprocedural states: Secondary | ICD-10-CM | POA: Diagnosis not present

## 2021-09-23 NOTE — Therapy (Signed)
OUTPATIENT PHYSICAL THERAPY TREATMENT NOTE   Patient Name: Melissa Maldonado MRN: [id number] DOB:12-06-1940, 81 y.o., female Today's Date: 09/23/2021  PCP: [name], MD REFERRING PROVIDER: Aundra Dubin, PA-C   PT End of Session - 09/23/21 1241     Visit Number 2    Number of Visits 17    Date for PT Re-Evaluation 11/12/21    Authorization Type MCR    Authorization Time Period FOTO v6, v10; KX Mod v15    Progress Note Due on Visit 10    PT Start Time 1242    PT Stop Time 1327    PT Time Calculation (min) 45 min             Past Medical History:  Diagnosis Date   Arthritis    Chronic back pain    lumbar stenosis   Early cataracts, bilateral    GERD (gastroesophageal reflux disease)    occasionally but no meds required   Headache(784.0)    occasionally   History of blood transfusion    History of colon polyps    Hyperlipidemia    takes Lipitor every other day   Hypertension    takes losartan daily   Neuropathy    Osteoarthritis of left knee 04/27/2014   Restless leg    Shortness of breath    with exertion   SOB (shortness of breath) 2013   CPET   Tear of medial meniscus of left knee 09/08/2013   Past Surgical History:  Procedure Laterality Date   APPENDECTOMY     BACK SURGERY  2010   lumb fusion   COLONOSCOPY     EYE SURGERY     both cataracts   KNEE ARTHROSCOPY WITH MEDIAL MENISECTOMY Left 09/08/2013   Procedure: LEFT KNEE ARTHROSCOPY WITH PARTIAL MEDIAL MENISCECTOMY;  Surgeon: [name], MD;  Location: [facility];  Service: Orthopedics;  Laterality: Left;   LUMBAR LAMINECTOMY/DECOMPRESSION MICRODISCECTOMY  05/13/2012   Procedure: LUMBAR LAMINECTOMY/DECOMPRESSION MICRODISCECTOMY 1 LEVEL;  Surgeon: [name], MD;  Location: [place] [facility];  Service: Neurosurgery;  Laterality: Bilateral;  Bilateral Lumbar one -two Decompressive Laminectomy   PARTIAL KNEE ARTHROPLASTY Left 04/27/2014   Procedure: LEFT UNICOMPARTMENTAL KNEE;  Surgeon:  [name], MD;  Location: [place];  Service: Orthopedics;  Laterality: Left;   UPPER GI ENDOSCOPY     Patient Active Problem List   Diagnosis Date Noted   Arthritis of right acromioclavicular joint 08/28/2021   Impingement syndrome of right shoulder 08/19/2021   Nontraumatic incomplete tear of right rotator cuff 08/19/2021   Paresthesia 12/05/2020   Low back pain with right-sided sciatica 12/05/2020   Trochanteric bursitis, right hip 04/24/2020   Essential hypertension 01/12/2020   Sinus bradycardia 01/12/2020   Dyspnea on exertion 01/12/2020   Lumbar stenosis with neurogenic claudication 02/01/2017   Osteoarthritis of left knee 04/27/2014   Tear of medial meniscus of left knee 09/08/2013    REFERRING DIAG: Z98.890 (ICD-10-CM) - S/P arthroscopy of right shoulder  THERAPY DIAG:  Chronic right shoulder pain  Muscle weakness (generalized)  PERTINENT HISTORY: Rt shoulder debridement, SAD, and biceps tenotomy 08/28/2021 L1-L2 lumbar laminectomy/ decompression in 2013  PRECAUTIONS: Shoulder; Rt shoulder biceps tenodemy ([facility])  SUBJECTIVE: I'm taking my medication which sort of helps.  PAIN:  Are you having pain? No NPRS scale: 0/10 Pain location: Rt shoulder PAIN TYPE: aching and sharp Pain description: intermittent  Aggravating factors: lifting arm overhead and quick movements Relieving factors:  pain medication, ice, and rest     OBJECTIVE:    DIAGNOSTIC FINDINGS:  None after surgery.   Prior to surgery: 05/31/2021: MR Shoulder Rt without contrast: IMPRESSION: 1. Diffuse severe supraspinatus tendinosis with full-thickness tear of the anterior tendon proximal to the insertion. 2. Severe distal infraspinatus tendinosis with small intrasubstance tear. 3. Moderate distal subscapularis tendinosis with small intrasubstance tear. 4. Severe biceps tendinosis with medial subluxation and small partial tear within the bicipital  groove. 5. Severe acromioclavicular and mild glenohumeral osteoarthritis.   PATIENT SURVEYS:  FOTO 53%, predicted 67% in 15 visits   COGNITION:          Overall cognitive status: Within functional limits for tasks assessed                               SENSATION:          Light touch: Appears intact   POSTURE: Forward head, BIL rounded shoulders   UPPER EXTREMITY AROM/PROM:   A/PROM Right 09/17/2021 Left 09/17/2021 Right 09/23/2021  Shoulder flexion 80/110p! 180 115/120p!  Shoulder abduction 78/103p! 180 80/90p!  Shoulder internal rotation 50/65p! 90 85p!  Shoulder external rotation 50/60p! 80 60/65p!  Elbow flexion 130/130 130/130   Elbow extension 0/0 0/0   (Blank rows = not tested)   UPPER EXTREMITY MMT:   MMT Right 09/17/2021 Left 09/17/2021  Shoulder flexion 3/5p! 4+/5  Shoulder abduction 3/5p! 4+/5  Shoulder internal rotation 4/5 5/5  Shoulder external rotation 3+/5p! 5/5  Middle trapezius      Latissimus Dorsi      Elbow flexion 4+/5 5/5  Elbow extension 5/5 5/5  Grip strength (lbs) 42 44  (Blank rows = not tested)     JOINT MOBILITY TESTING:  Mild hypomobility with minor pain with GHJ joint mobilization AP/PA/Inf   PALPATION:  TTP to surgical incisions at Rt shoulder            TODAY'S TREATMENT:  [facility] Adult PT Treatment:                                                DATE: 09/23/2021 Therapeutic Exercise: Supine shoulder flexion AAROM with dowel 2x10 Supine shoulder ER AAROM with dowel 2x10  Supine elbow flexion/extension w/towel under elbow 2x20 Supine forearm supination/pronation x20 Seated scapular retraction 2x10 Standing shoulder scaption AAROM with dowel 2x10 Posterior capsule stretch 2x30" Sleeper stretch Rt 2x30" Manual Therapy (Concentrating on increasing extensibility of restricted tissue to reduce discomfort and improve mechanics in functional movement) PROM R shoulder in all planes Modalities: Vasopneumatic compression  In seated, R  shoulder, low compression, 34    09/17/2021: Demonstrated and issued HEP     PATIENT EDUCATION: Education details: Pt educated on prognosis, POC, FOTO, HEP Person educated: Patient Education method: Consulting civil engineer, Media planner, and Handouts Education comprehension: verbalized understanding and returned demonstration     HOME EXERCISE PROGRAM: Access Code: [email or web address] URL: [email or web address] Date: 09/17/2021 Prepared by: Vanessa [place]   Exercises Shoulder Scaption AAROM with Dowel - 2 x daily - 7 x weekly - 3 sets - 10 reps - 5-sec hold Standing Shoulder External Rotation AAROM with Dowel - 2 x daily - 7 x weekly - 3 sets - 10 reps - 5-sec hold Standing Shoulder Posterior Capsule Stretch - 2 x daily - 7 x weekly -  3 sets - 30sec hold Seated Scapular Retraction - 2 x daily - 7 x weekly - 3 sets - 10 reps - 5-sec hold     ASSESSMENT:   CLINICAL IMPRESSION: Patient presents to PT with no current pain and reports varied HEP compliance, stating pain keeps her from completing them occasionally. Session today focused on improving ROM within protocol. Continue per [facility] and [facility] biceps tenotomy protocol. Patient continues to benefit from skilled PT services and should be progressed as able to improve functional independence.     OBJECTIVE IMPAIRMENTS decreased ROM, decreased strength, hypomobility, impaired flexibility, impaired UE functional use, postural dysfunction, and pain.    ACTIVITY LIMITATIONS cleaning, community activity, driving, meal prep, laundry, yard work, and shopping.    PERSONAL FACTORS 1 comorbidity: HTN  are also affecting patient's functional outcome.      REHAB POTENTIAL: Good   CLINICAL DECISION MAKING: Stable/uncomplicated   EVALUATION COMPLEXITY: Low     GOALS: Goals reviewed with patient? Yes   SHORT TERM GOALS:   STG Name Target Date Goal status  1 Pt will report understanding and adherence to her HEP in order to  promote independence in the management of her primary impairments. Baseline: HEP provided at eval 10/15/2021 INITIAL  2 Pt will achieve WNL global Rt shoulder AROM in order to get dressed with less limitation. Baseline: See AROM chart 10/15/2021 INITIAL    LONG TERM GOALS:    LTG Name Target Date Goal status  1 Pt will achieve a FOTO score of 67% in order to demonstrate improved functional ability as it relates to her shoulder symptoms. Baseline: 53% 11/12/2021 INITIAL  2 Pt will achieve global Rt shoulder MMT of 4+/5 in order to do heavy yard work with less limitation. Baseline: See MMT chart 11/12/2021 INITIAL  3 Pt will demonstrate ability to lift 5# weight into overhead cabinets with her Rt UE in order to put dishes away with less limitation. Baseline: Unable per post-op protocol 11/12/2021 INITIAL    PLAN: PT FREQUENCY: 2x/week   PT DURATION: 8 weeks   PLANNED INTERVENTIONS: Therapeutic exercises, Therapeutic activity, Neuromuscular re-education, Patient/Family education, Joint mobilization, Dry Needling, Electrical stimulation, Spinal mobilization, Cryotherapy, Moist heat, Taping, Vasopneumatic device, Manual therapy, and Neuro Muscular re-education   PLAN FOR NEXT SESSION: Progress Rt shoulder AROM per post-op protocol, introduce early strengthening at appropriate intervals    Evelene Croon, PTA 09/23/2021, 1:29 PM

## 2021-09-25 ENCOUNTER — Other Ambulatory Visit: Payer: Self-pay

## 2021-09-25 ENCOUNTER — Ambulatory Visit: Payer: Medicare Other

## 2021-09-25 DIAGNOSIS — G8929 Other chronic pain: Secondary | ICD-10-CM | POA: Diagnosis not present

## 2021-09-25 DIAGNOSIS — M25511 Pain in right shoulder: Secondary | ICD-10-CM | POA: Diagnosis not present

## 2021-09-25 DIAGNOSIS — M6281 Muscle weakness (generalized): Secondary | ICD-10-CM

## 2021-09-25 DIAGNOSIS — Z9889 Other specified postprocedural states: Secondary | ICD-10-CM | POA: Diagnosis not present

## 2021-09-25 NOTE — Therapy (Signed)
OUTPATIENT PHYSICAL THERAPY TREATMENT NOTE   Patient Name: Melissa Maldonado MRN: [id number] DOB:1940-12-12, 81 y.o., female Today's Date: 09/25/2021  PCP: [name], MD REFERRING PROVIDER: Aundra Dubin, PA-C   PT End of Session - 09/25/21 1353     Visit Number 3    Number of Visits 17    Date for PT Re-Evaluation 11/12/21    Authorization Type MCR    Authorization Time Period FOTO v6, v10; KX Mod v15    Progress Note Due on Visit 10    PT Start Time 1353    PT Stop Time 1435    PT Time Calculation (min) 42 min    Activity Tolerance Patient tolerated treatment well;Patient limited by pain    Behavior During Therapy WFL for tasks assessed/performed              Past Medical History:  Diagnosis Date   Arthritis    Chronic back pain    lumbar stenosis   Early cataracts, bilateral    GERD (gastroesophageal reflux disease)    occasionally but no meds required   Headache(784.0)    occasionally   History of blood transfusion    History of colon polyps    Hyperlipidemia    takes Lipitor every other day   Hypertension    takes losartan daily   Neuropathy    Osteoarthritis of left knee 04/27/2014   Restless leg    Shortness of breath    with exertion   SOB (shortness of breath) 2013   CPET   Tear of medial meniscus of left knee 09/08/2013   Past Surgical History:  Procedure Laterality Date   APPENDECTOMY     BACK SURGERY  2010   lumb fusion   COLONOSCOPY     EYE SURGERY     both cataracts   KNEE ARTHROSCOPY WITH MEDIAL MENISECTOMY Left 09/08/2013   Procedure: LEFT KNEE ARTHROSCOPY WITH PARTIAL MEDIAL MENISCECTOMY;  Surgeon: [name], MD;  Location: [place];  Service: Orthopedics;  Laterality: Left;   LUMBAR LAMINECTOMY/DECOMPRESSION MICRODISCECTOMY  05/13/2012   Procedure: LUMBAR LAMINECTOMY/DECOMPRESSION MICRODISCECTOMY 1 LEVEL;  Surgeon: [name], MD;  Location: [place] [facility];  Service: Neurosurgery;  Laterality: Bilateral;   Bilateral Lumbar one -two Decompressive Laminectomy   PARTIAL KNEE ARTHROPLASTY Left 04/27/2014   Procedure: LEFT UNICOMPARTMENTAL KNEE;  Surgeon: [name], MD;  Location: [place];  Service: Orthopedics;  Laterality: Left;   UPPER GI ENDOSCOPY     Patient Active Problem List   Diagnosis Date Noted   Arthritis of right acromioclavicular joint 08/28/2021   Impingement syndrome of right shoulder 08/19/2021   Nontraumatic incomplete tear of right rotator cuff 08/19/2021   Paresthesia 12/05/2020   Low back pain with right-sided sciatica 12/05/2020   Trochanteric bursitis, right hip 04/24/2020   Essential hypertension 01/12/2020   Sinus bradycardia 01/12/2020   Dyspnea on exertion 01/12/2020   Lumbar stenosis with neurogenic claudication 02/01/2017   Osteoarthritis of left knee 04/27/2014   Tear of medial meniscus of left knee 09/08/2013    REFERRING DIAG: Z98.890 (ICD-10-CM) - S/P arthroscopy of right shoulder  THERAPY DIAG:  Chronic right shoulder pain  Muscle weakness (generalized)  PERTINENT HISTORY: Rt shoulder debridement, SAD, and biceps tenotomy 08/28/2021 L1-L2 lumbar laminectomy/ decompression in 2013  PRECAUTIONS: Shoulder; Rt shoulder biceps tenodemy ([facility])  SUBJECTIVE: I'm feeling a bit achy today, I did all of my exercises yesterday.  PAIN:  Are you having  pain? No NPRS scale: 6/10 Pain location: Rt shoulder PAIN TYPE: aching and sharp Pain description: intermittent  Aggravating factors: lifting arm overhead and quick movements Relieving factors: pain medication, ice, and rest     OBJECTIVE:    DIAGNOSTIC FINDINGS:  None after surgery.   Prior to surgery: 05/31/2021: MR Shoulder Rt without contrast: IMPRESSION: 1. Diffuse severe supraspinatus tendinosis with full-thickness tear of the anterior tendon proximal to the insertion. 2. Severe distal infraspinatus tendinosis with small intrasubstance tear. 3.  Moderate distal subscapularis tendinosis with small intrasubstance tear. 4. Severe biceps tendinosis with medial subluxation and small partial tear within the bicipital groove. 5. Severe acromioclavicular and mild glenohumeral osteoarthritis.   PATIENT SURVEYS:  FOTO 53%, predicted 67% in 15 visits   COGNITION:          Overall cognitive status: Within functional limits for tasks assessed                               SENSATION:          Light touch: Appears intact   POSTURE: Forward head, BIL rounded shoulders   UPPER EXTREMITY AROM/PROM:   A/PROM Right 09/17/2021 Left 09/17/2021 Right 09/23/2021 Right 09/25/2021  Shoulder flexion 80/110p! 180 115/120p! 110/125p!  Shoulder abduction 78/103p! 180 80/90p! 75/90p!  Shoulder internal rotation 50/65p! 90 85p! 86  Shoulder external rotation 50/60p! 80 60/65p! 50/55p!  Elbow flexion 130/130 130/130    Elbow extension 0/0 0/0    (Blank rows = not tested)   UPPER EXTREMITY MMT:   MMT Right 09/17/2021 Left 09/17/2021  Shoulder flexion 3/5p! 4+/5  Shoulder abduction 3/5p! 4+/5  Shoulder internal rotation 4/5 5/5  Shoulder external rotation 3+/5p! 5/5  Middle trapezius      Latissimus Dorsi      Elbow flexion 4+/5 5/5  Elbow extension 5/5 5/5  Grip strength (lbs) 42 44  (Blank rows = not tested)     JOINT MOBILITY TESTING:  Mild hypomobility with minor pain with GHJ joint mobilization AP/PA/Inf   PALPATION:  TTP to surgical incisions at Rt shoulder            TODAY'S TREATMENT:  [facility] Adult PT Treatment:                                                DATE: 09/25/2021 Therapeutic Exercise: Supine shoulder flexion AAROM with dowel 2x10 Supine shoulder ER AAROM with dowel 2x10  Supine elbow flexion/extension w/towel under elbow 3x10 Supine forearm supination/pronation x30 Standing shoulder scaption AAROM with dowel 2x10 Posterior capsule stretch 3x30" Sleeper stretch Rt 2x30" Manual Therapy (Concentrating on increasing  extensibility of restricted tissue to reduce discomfort and improve mechanics in functional movement) PROM R shoulder in all planes   [facility] Adult PT Treatment:                                                DATE: 09/23/2021 Therapeutic Exercise: Supine shoulder flexion AAROM with dowel 2x10 Supine shoulder ER AAROM with dowel 2x10  Supine elbow flexion/extension w/towel under elbow 2x20 Supine forearm supination/pronation x20 Seated scapular retraction 2x10 Standing shoulder scaption AAROM with dowel 2x10 Posterior capsule stretch 2x30" Sleeper  stretch Rt 2x30" Manual Therapy (Concentrating on increasing extensibility of restricted tissue to reduce discomfort and improve mechanics in functional movement) PROM R shoulder in all planes Modalities: Vasopneumatic compression  In seated, R shoulder, low compression, 34   09/17/2021: Demonstrated and issued HEP     PATIENT EDUCATION: Education details: Pt educated on prognosis, POC, FOTO, HEP Person educated: Patient Education method: Consulting civil engineer, Media planner, and Handouts Education comprehension: verbalized understanding and returned demonstration     HOME EXERCISE PROGRAM: Access Code: [email or web address] URL: [email or web address] Date: 09/17/2021 Prepared by: Vanessa [place]   Exercises Shoulder Scaption AAROM with Dowel - 2 x daily - 7 x weekly - 3 sets - 10 reps - 5-sec hold Standing Shoulder External Rotation AAROM with Dowel - 2 x daily - 7 x weekly - 3 sets - 10 reps - 5-sec hold Standing Shoulder Posterior Capsule Stretch - 2 x daily - 7 x weekly - 3 sets - 30sec hold Seated Scapular Retraction - 2 x daily - 7 x weekly - 3 sets - 10 reps - 5-sec hold     ASSESSMENT:   CLINICAL IMPRESSION: Patient presents to PT with soreness from previous session and completing her HEP yesterday. Today's session focused on improving right shoulder ROM within protocol. Continue per [facility] biceps tenotomy  protocol. She was able to improve her right shoulder flexion and internal rotation this session. Patient continues to benefit from skilled PT services and should be progressed as able to improve functional independence.     OBJECTIVE IMPAIRMENTS decreased ROM, decreased strength, hypomobility, impaired flexibility, impaired UE functional use, postural dysfunction, and pain.    ACTIVITY LIMITATIONS cleaning, community activity, driving, meal prep, laundry, yard work, and shopping.    PERSONAL FACTORS 1 comorbidity: HTN  are also affecting patient's functional outcome.      REHAB POTENTIAL: Good   CLINICAL DECISION MAKING: Stable/uncomplicated   EVALUATION COMPLEXITY: Low     GOALS: Goals reviewed with patient? Yes   SHORT TERM GOALS:   STG Name Target Date Goal status  1 Pt will report understanding and adherence to her HEP in order to promote independence in the management of her primary impairments. Baseline: HEP provided at eval 10/15/2021 INITIAL  2 Pt will achieve WNL global Rt shoulder AROM in order to get dressed with less limitation. Baseline: See AROM chart 10/15/2021 INITIAL    LONG TERM GOALS:    LTG Name Target Date Goal status  1 Pt will achieve a FOTO score of 67% in order to demonstrate improved functional ability as it relates to her shoulder symptoms. Baseline: 53% 11/12/2021 INITIAL  2 Pt will achieve global Rt shoulder MMT of 4+/5 in order to do heavy yard work with less limitation. Baseline: See MMT chart 11/12/2021 INITIAL  3 Pt will demonstrate ability to lift 5# weight into overhead cabinets with her Rt UE in order to put dishes away with less limitation. Baseline: Unable per post-op protocol 11/12/2021 INITIAL    PLAN: PT FREQUENCY: 2x/week   PT DURATION: 8 weeks   PLANNED INTERVENTIONS: Therapeutic exercises, Therapeutic activity, Neuromuscular re-education, Patient/Family education, Joint mobilization, Dry Needling, Electrical stimulation, Spinal  mobilization, Cryotherapy, Moist heat, Taping, Vasopneumatic device, Manual therapy, and Neuro Muscular re-education   PLAN FOR NEXT SESSION: Progress Rt shoulder AROM per post-op protocol, introduce early strengthening at appropriate intervals    Evelene Croon, PTA 09/25/2021, 2:35 PM

## 2021-09-30 ENCOUNTER — Other Ambulatory Visit: Payer: Self-pay

## 2021-09-30 ENCOUNTER — Ambulatory Visit: Payer: Medicare Other

## 2021-09-30 ENCOUNTER — Telehealth: Payer: Self-pay

## 2021-09-30 DIAGNOSIS — M6281 Muscle weakness (generalized): Secondary | ICD-10-CM

## 2021-09-30 DIAGNOSIS — M25511 Pain in right shoulder: Secondary | ICD-10-CM | POA: Diagnosis not present

## 2021-09-30 DIAGNOSIS — G8929 Other chronic pain: Secondary | ICD-10-CM

## 2021-09-30 DIAGNOSIS — Z9889 Other specified postprocedural states: Secondary | ICD-10-CM | POA: Diagnosis not present

## 2021-09-30 NOTE — Therapy (Signed)
?OUTPATIENT PHYSICAL THERAPY TREATMENT NOTE ? ? ?Patient Name: Melissa Maldonado ?MRN: [id number] ?DOB:1941-05-22, 81 y.o., female ?Today's Date: 09/30/2021 ? ?PCP: [name], MD ?REFERRING PROVIDER: Aundra Dubin, PA-C ? ? PT End of Session - 09/30/21 1528   ? ? Visit Number 4   ? Number of Visits 17   ? Date for PT Re-Evaluation 11/12/21   ? Authorization Type MCR   ? Authorization Time Period FOTO v6, v10; KX Mod v15   ? Progress Note Due on Visit 10   ? PT Start Time 2778   ? PT Stop Time 1610   ? PT Time Calculation (min) 43 min   ? Activity Tolerance Patient tolerated treatment well;Patient limited by pain   ? Behavior During Therapy [facility] for tasks assessed/performed   ? ?  ?  ? ?  ? ? ? ? ?Past Medical History:  ?Diagnosis Date  ? Arthritis   ? Chronic back pain   ? lumbar stenosis  ? Early cataracts, bilateral   ? GERD (gastroesophageal reflux disease)   ? occasionally but no meds required  ? Headache(784.0)   ? occasionally  ? History of blood transfusion   ? History of colon polyps   ? Hyperlipidemia   ? takes Lipitor every other day  ? Hypertension   ? takes losartan daily  ? Neuropathy   ? Osteoarthritis of left knee 04/27/2014  ? Restless leg   ? Shortness of breath   ? with exertion  ? SOB (shortness of breath) 2013  ? CPET  ? Tear of medial meniscus of left knee 09/08/2013  ? ?Past Surgical History:  ?Procedure Laterality Date  ? APPENDECTOMY    ? BACK SURGERY  2010  ? lumb fusion  ? COLONOSCOPY    ? EYE SURGERY    ? both cataracts  ? KNEE ARTHROSCOPY WITH MEDIAL MENISECTOMY Left 09/08/2013  ? Procedure: LEFT KNEE ARTHROSCOPY WITH PARTIAL MEDIAL MENISCECTOMY;  Surgeon: [name], MD;  Location: [place];  Service: Orthopedics;  Laterality: Left;  ? LUMBAR LAMINECTOMY/DECOMPRESSION MICRODISCECTOMY  05/13/2012  ? Procedure: LUMBAR LAMINECTOMY/DECOMPRESSION MICRODISCECTOMY 1 LEVEL;  Surgeon: [name], MD;  Location: [place] [facility];  Service: Neurosurgery;  Laterality: Bilateral;   Bilateral Lumbar one -two Decompressive Laminectomy  ? PARTIAL KNEE ARTHROPLASTY Left 04/27/2014  ? Procedure: LEFT UNICOMPARTMENTAL KNEE;  Surgeon: [name], MD;  Location: [place];  Service: Orthopedics;  Laterality: Left;  ? UPPER GI ENDOSCOPY    ? ?Patient Active Problem List  ? Diagnosis Date Noted  ? Arthritis of right acromioclavicular joint 08/28/2021  ? Impingement syndrome of right shoulder 08/19/2021  ? Nontraumatic incomplete tear of right rotator cuff 08/19/2021  ? Paresthesia 12/05/2020  ? Low back pain with right-sided sciatica 12/05/2020  ? Trochanteric bursitis, right hip 04/24/2020  ? Essential hypertension 01/12/2020  ? Sinus bradycardia 01/12/2020  ? Dyspnea on exertion 01/12/2020  ? Lumbar stenosis with neurogenic claudication 02/01/2017  ? Osteoarthritis of left knee 04/27/2014  ? Tear of medial meniscus of left knee 09/08/2013  ? ? ?REFERRING DIAG: Z98.890 (ICD-10-CM) - S/P arthroscopy of right shoulder ? ?THERAPY DIAG:  ?Chronic right shoulder pain ? ?Muscle weakness (generalized) ? ?PERTINENT HISTORY: Rt shoulder debridement, SAD, and biceps tenotomy 08/28/2021 ?L1-L2 lumbar laminectomy/ decompression in 2013 ? ?PRECAUTIONS: Shoulder; Rt shoulder biceps tenodemy ([facility]) ? ?SUBJECTIVE: I'm feeling a coldness in my shoulder that comes and goes and it goes from one shoulder to  another. ? ?PAIN:  ?Are you having pain? Yes ?NPRS scale: 6/10 ?Pain location: Rt shoulder ?PAIN TYPE: aching and sharp ?Pain description: intermittent  ?Aggravating factors: lifting arm overhead and quick movements ?Relieving factors: pain medication, ice, and rest ? ? ? ? ?OBJECTIVE:  ?  ?DIAGNOSTIC FINDINGS:  ?None after surgery. ?  ?Prior to surgery: ?05/31/2021: MR Shoulder Rt without contrast: IMPRESSION: ?1. Diffuse severe supraspinatus tendinosis with full-thickness tear ?of the anterior tendon proximal to the insertion. ?2. Severe distal infraspinatus tendinosis with  small intrasubstance ?tear. ?3. Moderate distal subscapularis tendinosis with small ?intrasubstance tear. ?4. Severe biceps tendinosis with medial subluxation and small ?partial tear within the bicipital groove. ?5. Severe acromioclavicular and mild glenohumeral osteoarthritis. ?  ?PATIENT SURVEYS:  ?FOTO 53%, predicted 67% in 15 visits ?  ?COGNITION: ?         Overall cognitive status: Within functional limits for tasks assessed ?                              ?SENSATION: ?         Light touch: Appears intact ?  ?POSTURE: ?Forward head, BIL rounded shoulders ?  ?UPPER EXTREMITY AROM/PROM: ?  ?A/PROM Right ?09/17/2021 Left ?09/17/2021 Right ?09/23/2021 Right ?09/25/2021 Right ?09/30/21  ?Shoulder flexion 80/110p! 180 115/120p! 110/125p! 130/135p!  ?Shoulder abduction 78/103p! 180 80/90p! 75/90p! 90/105p!  ?Shoulder internal rotation 50/65p! 90 85p! 86   ?Shoulder external rotation 50/60p! 80 60/65p! 50/55p! 60/66p!  ?Elbow flexion 130/130 130/130     ?Elbow extension 0/0 0/0     ?(Blank rows = not tested) ?  ?UPPER EXTREMITY MMT: ?  ?MMT Right ?09/17/2021 Left ?09/17/2021  ?Shoulder flexion 3/5p! 4+/5  ?Shoulder abduction 3/5p! 4+/5  ?Shoulder internal rotation 4/5 5/5  ?Shoulder external rotation 3+/5p! 5/5  ?Middle trapezius      ?Latissimus Dorsi      ?Elbow flexion 4+/5 5/5  ?Elbow extension 5/5 5/5  ?Grip strength (lbs) 42 44  ?(Blank rows = not tested) ?  ?  ?JOINT MOBILITY TESTING:  ?Mild hypomobility with minor pain with GHJ joint mobilization AP/PA/Inf ?  ?PALPATION:  ?TTP to surgical incisions at Rt shoulder ?           ?TODAY'S TREATMENT:  ?[facility] Adult PT Treatment:                                                DATE: 09/30/2021 ?Therapeutic Exercise: ?Supine shoulder flexion AAROM with dowel 2x10 ?Supine shoulder ER AAROM with dowel 2x10  ?Standing shoulder scaption AAROM with dowel 2x10 ?Standing shoulder abduction AAROM with dowel 2x10 ?Sidelying ER AROM on towel roll 3x10 ?Posterior capsule stretch 3x30" ?Manual  Therapy (concentrating on increasing extensibility of restricted tissue to reduce discomfort and improve mechanics in functional movement): ?PROM R shoulder focusing on abduction and flexion ? ? ?[facility] Adult PT Treatment:                                                DATE: 09/25/2021 ?Therapeutic Exercise: ?Supine shoulder flexion AAROM with dowel 2x10 ?Supine shoulder ER AAROM with dowel 2x10  ?Supine elbow flexion/extension w/towel under elbow 3x10 ?Supine forearm supination/pronation x30 ?  Standing shoulder scaption AAROM with dowel 2x10 ?Posterior capsule stretch 3x30" ?Sleeper stretch Rt 2x30" ?Manual Therapy (Concentrating on increasing extensibility of restricted tissue to reduce discomfort and improve mechanics in functional movement) ?PROM R shoulder in all planes ? ? ?[facility] Adult PT Treatment:                                                DATE: 09/23/2021 ?Therapeutic Exercise: ?Supine shoulder flexion AAROM with dowel 2x10 ?Supine shoulder ER AAROM with dowel 2x10  ?Supine elbow flexion/extension w/towel under elbow 2x20 ?Supine forearm supination/pronation x20 ?Seated scapular retraction 2x10 ?Standing shoulder scaption AAROM with dowel 2x10 ?Posterior capsule stretch 2x30" ?Sleeper stretch Rt 2x30" ?Manual Therapy (Concentrating on increasing extensibility of restricted tissue to reduce discomfort and improve mechanics in functional movement) ?PROM R shoulder in all planes ?Modalities: ?Vasopneumatic compression  ?In seated, R shoulder, low compression, 34? ? ?  ?  ?PATIENT EDUCATION: ?Education details: Pt educated on prognosis, POC, FOTO, HEP ?Person educated: Patient ?Education method: Explanation, Demonstration, and Handouts ?Education comprehension: verbalized understanding and returned demonstration ?  ?  ?HOME EXERCISE PROGRAM: ?Access Code: [email or web address] ?URL: [email or web address] ?Date: 09/17/2021 ?Prepared by: Vanessa [place] ?  ?Exercises ?Shoulder Scaption AAROM with Dowel - 2 x daily  - 7 x weekly - 3 sets - 10 reps - 5-sec hold ?Standing Shoulder External Rotation AAROM with Dowel - 2 x daily - 7 x weekly - 3 sets - 10 reps - 5-sec hold ?Standing Shoulder Posterior Capsule Stretch - 2

## 2021-10-02 ENCOUNTER — Telehealth: Payer: Self-pay

## 2021-10-02 ENCOUNTER — Ambulatory Visit: Payer: Medicare Other | Admitting: Physical Therapy

## 2021-10-02 ENCOUNTER — Other Ambulatory Visit: Payer: Self-pay

## 2021-10-02 ENCOUNTER — Encounter: Payer: Self-pay | Admitting: Physical Therapy

## 2021-10-02 DIAGNOSIS — M25511 Pain in right shoulder: Secondary | ICD-10-CM | POA: Diagnosis not present

## 2021-10-02 DIAGNOSIS — Z9889 Other specified postprocedural states: Secondary | ICD-10-CM | POA: Diagnosis not present

## 2021-10-02 DIAGNOSIS — M6281 Muscle weakness (generalized): Secondary | ICD-10-CM | POA: Diagnosis not present

## 2021-10-02 DIAGNOSIS — G8929 Other chronic pain: Secondary | ICD-10-CM | POA: Diagnosis not present

## 2021-10-02 NOTE — Telephone Encounter (Signed)
Patient came into the office stating that she is having bil shoulder pain its like a cold sensation.  ?

## 2021-10-02 NOTE — Therapy (Signed)
?OUTPATIENT PHYSICAL THERAPY TREATMENT NOTE ? ? ?Patient Name: Melissa Maldonado ?MRN: [id number] ?DOB:04/22/1941, 81 y.o., female ?Today's Date: 10/02/2021 ? ?PCP: [name], MD ?REFERRING PROVIDER: Aundra Dubin, PA-C ? ? PT End of Session - 10/02/21 1358   ? ? Visit Number 5   ? Number of Visits 17   ? Date for PT Re-Evaluation 11/12/21   ? Authorization Type MCR   ? Authorization Time Period FOTO v6, v10; KX Mod v15   ? Progress Note Due on Visit 10   ? PT Start Time 1358   ? PT Stop Time 1438   ? PT Time Calculation (min) 40 min   ? Activity Tolerance Patient tolerated treatment well;Patient limited by pain   ? Behavior During Therapy [facility] for tasks assessed/performed   ? ?  ?  ? ?  ? ? ? ? ?Past Medical History:  ?Diagnosis Date  ? Arthritis   ? Chronic back pain   ? lumbar stenosis  ? Early cataracts, bilateral   ? GERD (gastroesophageal reflux disease)   ? occasionally but no meds required  ? Headache(784.0)   ? occasionally  ? History of blood transfusion   ? History of colon polyps   ? Hyperlipidemia   ? takes Lipitor every other day  ? Hypertension   ? takes losartan daily  ? Neuropathy   ? Osteoarthritis of left knee 04/27/2014  ? Restless leg   ? Shortness of breath   ? with exertion  ? SOB (shortness of breath) 2013  ? CPET  ? Tear of medial meniscus of left knee 09/08/2013  ? ?Past Surgical History:  ?Procedure Laterality Date  ? APPENDECTOMY    ? BACK SURGERY  2010  ? lumb fusion  ? COLONOSCOPY    ? EYE SURGERY    ? both cataracts  ? KNEE ARTHROSCOPY WITH MEDIAL MENISECTOMY Left 09/08/2013  ? Procedure: LEFT KNEE ARTHROSCOPY WITH PARTIAL MEDIAL MENISCECTOMY;  Surgeon: [name], MD;  Location: [place];  Service: Orthopedics;  Laterality: Left;  ? LUMBAR LAMINECTOMY/DECOMPRESSION MICRODISCECTOMY  05/13/2012  ? Procedure: LUMBAR LAMINECTOMY/DECOMPRESSION MICRODISCECTOMY 1 LEVEL;  Surgeon: [name], MD;  Location: [place] [facility];  Service: Neurosurgery;  Laterality: Bilateral;   Bilateral Lumbar one -two Decompressive Laminectomy  ? PARTIAL KNEE ARTHROPLASTY Left 04/27/2014  ? Procedure: LEFT UNICOMPARTMENTAL KNEE;  Surgeon: [name], MD;  Location: [place];  Service: Orthopedics;  Laterality: Left;  ? UPPER GI ENDOSCOPY    ? ?Patient Active Problem List  ? Diagnosis Date Noted  ? Arthritis of right acromioclavicular joint 08/28/2021  ? Impingement syndrome of right shoulder 08/19/2021  ? Nontraumatic incomplete tear of right rotator cuff 08/19/2021  ? Paresthesia 12/05/2020  ? Low back pain with right-sided sciatica 12/05/2020  ? Trochanteric bursitis, right hip 04/24/2020  ? Essential hypertension 01/12/2020  ? Sinus bradycardia 01/12/2020  ? Dyspnea on exertion 01/12/2020  ? Lumbar stenosis with neurogenic claudication 02/01/2017  ? Osteoarthritis of left knee 04/27/2014  ? Tear of medial meniscus of left knee 09/08/2013  ? ? ?REFERRING DIAG: Z98.890 (ICD-10-CM) - S/P arthroscopy of right shoulder ? ?THERAPY DIAG:  ?Chronic right shoulder pain ? ?Muscle weakness (generalized) ? ?PERTINENT HISTORY: Rt shoulder debridement, SAD, and biceps tenotomy 08/28/2021 ?L1-L2 lumbar laminectomy/ decompression in 2013 ? ?PRECAUTIONS: Shoulder; Rt shoulder biceps tenodemy ([facility]) ? ?SUBJECTIVE: ?Pt report that she has been doing well. ? ?PAIN:  ?Are you having pain? Yes ?NPRS scale:  4/10 ?Pain location: Rt shoulder ?PAIN TYPE: aching and sharp ?Pain description: intermittent  ?Aggravating factors: lifting arm overhead and quick movements ?Relieving factors: pain medication, ice, and rest ? ? ? ? ?OBJECTIVE:  ?  ?DIAGNOSTIC FINDINGS:  ?None after surgery. ?  ?Prior to surgery: ?05/31/2021: MR Shoulder Rt without contrast: IMPRESSION: ?1. Diffuse severe supraspinatus tendinosis with full-thickness tear ?of the anterior tendon proximal to the insertion. ?2. Severe distal infraspinatus tendinosis with small intrasubstance ?tear. ?3. Moderate distal  subscapularis tendinosis with small ?intrasubstance tear. ?4. Severe biceps tendinosis with medial subluxation and small ?partial tear within the bicipital groove. ?5. Severe acromioclavicular and mild glenohumeral osteoarthritis. ?  ?PATIENT SURVEYS:  ?FOTO 53%, predicted 67% in 15 visits ?  ?COGNITION: ?         Overall cognitive status: Within functional limits for tasks assessed ?                              ?SENSATION: ?         Light touch: Appears intact ?  ?POSTURE: ?Forward head, BIL rounded shoulders ?  ?UPPER EXTREMITY AROM/PROM: ?  ?A/PROM Right ?09/17/2021 Left ?09/17/2021 Right ?09/23/2021 Right ?09/25/2021 Right ?09/30/21 R  ?Shoulder flexion 80/110p! 180 115/120p! 110/125p! 130/135p! 130  ?Shoulder abduction 78/103p! 180 80/90p! 75/90p! 90/105p!   ?Shoulder internal rotation 50/65p! 90 85p! 86    ?Shoulder external rotation 50/60p! 80 60/65p! 50/55p! 60/66p!   ?Elbow flexion 130/130 130/130      ?Elbow extension 0/0 0/0      ?(Blank rows = not tested) ?  ?UPPER EXTREMITY MMT: ?  ?MMT Right ?09/17/2021 Left ?09/17/2021  ?Shoulder flexion 3/5p! 4+/5  ?Shoulder abduction 3/5p! 4+/5  ?Shoulder internal rotation 4/5 5/5  ?Shoulder external rotation 3+/5p! 5/5  ?Middle trapezius      ?Latissimus Dorsi      ?Elbow flexion 4+/5 5/5  ?Elbow extension 5/5 5/5  ?Grip strength (lbs) 42 44  ?(Blank rows = not tested) ?  ?  ?JOINT MOBILITY TESTING:  ?Mild hypomobility with minor pain with GHJ joint mobilization AP/PA/Inf ?  ?PALPATION:  ?TTP to surgical incisions at Rt shoulder ?           ?TODAY'S TREATMENT:  ? ?[place] Adult PT Treatment:                                                DATE: 10/02/2021 ?Therapeutic Exercise: ?Supine shoulder flexion AAROM with Pball 2x20 ?Chest press - 2# 2x20 ?Standing shoulder scaption AAROM with dowel 2x10 ?Standing shoulder abduction AAROM with dowel 2x10 ?Sidelying ER AROM on towel roll 3x10 ?Biceps curl in supine - 3x20 2# ?Posterior capsule stretch 3x30" ? ?Manual Therapy (concentrating  on increasing extensibility of restricted tissue to reduce discomfort and improve mechanics in functional movement): ?PROM R shoulder focusing on abduction and flexion ? ?Modalities: ? ?Vasopneumatic (Game Ready)   ? ?Location:  right shoulder ?Time:  10 minutes ?Pressure:  low ?Temperature:  38 degrees ? ?[facility] Adult PT Treatment:                                                DATE: 09/30/2021 ?Therapeutic Exercise: ?Supine  shoulder flexion AAROM with dowel 2x10 ?Supine shoulder ER AAROM with dowel 2x10  ?Standing shoulder scaption AAROM with dowel 2x10 ?Standing shoulder abduction AAROM with dowel 2x10 ?Sidelying ER AROM on towel roll 3x10 ?Posterior capsule stretch 3x30" ?Manual Therapy (concentrating on increasing extensibility of restricted tissue to reduce discomfort and improve mechanics in functional movement): ?PROM R shoulder focusing on abduction and flexion ? ? ?[facility] Adult PT Treatment:                                                DATE: 09/25/2021 ?Therapeutic Exercise: ?Supine shoulder flexion AAROM with dowel 2x10 ?Supine shoulder ER AAROM with dowel 2x10  ?Supine elbow flexion/extension w/towel under elbow 3x10 ?Supine forearm supination/pronation x30 ?Standing shoulder scaption AAROM with dowel 2x10 ?Posterior capsule stretch 3x30" ?Sleeper stretch Rt 2x30" ?Manual Therapy (Concentrating on increasing extensibility of restricted tissue to reduce discomfort and improve mechanics in functional movement) ?PROM R shoulder in all planes ? ? ?[facility] Adult PT Treatment:                                                DATE: 09/23/2021 ?Therapeutic Exercise: ?Supine shoulder flexion AAROM with dowel 2x10 ?Supine shoulder ER AAROM with dowel 2x10  ?Supine elbow flexion/extension w/towel under elbow 2x20 ?Supine forearm supination/pronation x20 ?Seated scapular retraction 2x10 ?Standing shoulder scaption AAROM with dowel 2x10 ?Posterior capsule stretch 2x30" ?Sleeper stretch Rt 2x30" ?Manual Therapy (Concentrating on  increasing extensibility of restricted tissue to reduce discomfort and improve mechanics in functional movement) ?PROM R shoulder in all planes ?Modalities: ?Vasopneumatic compression  ?In seated, R shoulder, low c

## 2021-10-02 NOTE — Telephone Encounter (Signed)
Not sure what to make of that.  She should let us know if it doesn't resolve.

## 2021-10-02 NOTE — Telephone Encounter (Signed)
Called and left a VM for patient to CB concerning shoulder. ?

## 2021-10-03 NOTE — Telephone Encounter (Signed)
Patient aware. She will come in to have shoulder examined. Already has a sched. Appt. ? ?

## 2021-10-07 ENCOUNTER — Encounter: Payer: Self-pay | Admitting: Physical Therapy

## 2021-10-07 ENCOUNTER — Ambulatory Visit: Payer: Medicare Other | Admitting: Physical Therapy

## 2021-10-07 ENCOUNTER — Other Ambulatory Visit: Payer: Self-pay

## 2021-10-07 DIAGNOSIS — Z9889 Other specified postprocedural states: Secondary | ICD-10-CM | POA: Diagnosis not present

## 2021-10-07 DIAGNOSIS — M6281 Muscle weakness (generalized): Secondary | ICD-10-CM | POA: Diagnosis not present

## 2021-10-07 DIAGNOSIS — G8929 Other chronic pain: Secondary | ICD-10-CM | POA: Diagnosis not present

## 2021-10-07 DIAGNOSIS — M25511 Pain in right shoulder: Secondary | ICD-10-CM | POA: Diagnosis not present

## 2021-10-07 NOTE — Therapy (Signed)
?OUTPATIENT PHYSICAL THERAPY TREATMENT NOTE ? ? ?Patient Name: Melissa Maldonado ?MRN: [id number] ?DOB:1940-10-25, 81 y.o., female ?Today's Date: 10/07/2021 ? ?PCP: [name], MD ?REFERRING PROVIDER: Aundra Dubin, PA-C ? ? PT End of Session - 10/07/21 1353   ? ? Visit Number 6   ? Number of Visits 17   ? Date for PT Re-Evaluation 11/12/21   ? Authorization Type MCR   ? Authorization Time Period FOTO v6, v10; KX Mod v15   ? Progress Note Due on Visit 10   ? PT Start Time 6720   ? PT Stop Time 9470   ? PT Time Calculation (min) 40 min   ? Activity Tolerance Patient tolerated treatment well;Patient limited by pain   ? Behavior During Therapy [facility] for tasks assessed/performed   ? ?  ?  ? ?  ? ? ? ? ?Past Medical History:  ?Diagnosis Date  ? Arthritis   ? Chronic back pain   ? lumbar stenosis  ? Early cataracts, bilateral   ? GERD (gastroesophageal reflux disease)   ? occasionally but no meds required  ? Headache(784.0)   ? occasionally  ? History of blood transfusion   ? History of colon polyps   ? Hyperlipidemia   ? takes Lipitor every other day  ? Hypertension   ? takes losartan daily  ? Neuropathy   ? Osteoarthritis of left knee 04/27/2014  ? Restless leg   ? Shortness of breath   ? with exertion  ? SOB (shortness of breath) 2013  ? CPET  ? Tear of medial meniscus of left knee 09/08/2013  ? ?Past Surgical History:  ?Procedure Laterality Date  ? APPENDECTOMY    ? BACK SURGERY  2010  ? lumb fusion  ? COLONOSCOPY    ? EYE SURGERY    ? both cataracts  ? KNEE ARTHROSCOPY WITH MEDIAL MENISECTOMY Left 09/08/2013  ? Procedure: LEFT KNEE ARTHROSCOPY WITH PARTIAL MEDIAL MENISCECTOMY;  Surgeon: [name], MD;  Location: [place];  Service: Orthopedics;  Laterality: Left;  ? LUMBAR LAMINECTOMY/DECOMPRESSION MICRODISCECTOMY  05/13/2012  ? Procedure: LUMBAR LAMINECTOMY/DECOMPRESSION MICRODISCECTOMY 1 LEVEL;  Surgeon: [name], MD;  Location: [place] [facility];  Service: Neurosurgery;  Laterality: Bilateral;   Bilateral Lumbar one -two Decompressive Laminectomy  ? PARTIAL KNEE ARTHROPLASTY Left 04/27/2014  ? Procedure: LEFT UNICOMPARTMENTAL KNEE;  Surgeon: [name], MD;  Location: [place];  Service: Orthopedics;  Laterality: Left;  ? UPPER GI ENDOSCOPY    ? ?Patient Active Problem List  ? Diagnosis Date Noted  ? Arthritis of right acromioclavicular joint 08/28/2021  ? Impingement syndrome of right shoulder 08/19/2021  ? Nontraumatic incomplete tear of right rotator cuff 08/19/2021  ? Paresthesia 12/05/2020  ? Low back pain with right-sided sciatica 12/05/2020  ? Trochanteric bursitis, right hip 04/24/2020  ? Essential hypertension 01/12/2020  ? Sinus bradycardia 01/12/2020  ? Dyspnea on exertion 01/12/2020  ? Lumbar stenosis with neurogenic claudication 02/01/2017  ? Osteoarthritis of left knee 04/27/2014  ? Tear of medial meniscus of left knee 09/08/2013  ? ? ?REFERRING DIAG: Z98.890 (ICD-10-CM) - S/P arthroscopy of right shoulder ? ?THERAPY DIAG:  ?Chronic right shoulder pain ? ?Muscle weakness (generalized) ? ?PERTINENT HISTORY: Rt shoulder debridement, SAD, and biceps tenotomy 08/28/2021 ?L1-L2 lumbar laminectomy/ decompression in 2013 ? ?PRECAUTIONS: Shoulder; Rt shoulder biceps tenodemy ([facility]) ? ?SUBJECTIVE: ?Pt reports that her pain level is high today.  She thinks this may be d/t running out  of Tylenol and some increased activity at home. ? ? ?PAIN:  ?Are you having pain? Yes ?NPRS scale: 6/10 ?Pain location: Rt shoulder ?PAIN TYPE: aching and sharp ?Pain description: intermittent  ?Aggravating factors: lifting arm overhead and quick movements ?Relieving factors: pain medication, ice, and rest ? ? ? ? ?OBJECTIVE:  ?  ?DIAGNOSTIC FINDINGS:  ?None after surgery. ?  ?Prior to surgery: ?05/31/2021: MR Shoulder Rt without contrast: IMPRESSION: ?1. Diffuse severe supraspinatus tendinosis with full-thickness tear ?of the anterior tendon proximal to the insertion. ?2. Severe  distal infraspinatus tendinosis with small intrasubstance ?tear. ?3. Moderate distal subscapularis tendinosis with small ?intrasubstance tear. ?4. Severe biceps tendinosis with medial subluxation and small ?partial tear within the bicipital groove. ?5. Severe acromioclavicular and mild glenohumeral osteoarthritis. ?  ?PATIENT SURVEYS:  ?FOTO 53%, predicted 67% in 15 visits ?  ?COGNITION: ?         Overall cognitive status: Within functional limits for tasks assessed ?                              ?SENSATION: ?         Light touch: Appears intact ?  ?POSTURE: ?Forward head, BIL rounded shoulders ?  ?UPPER EXTREMITY AROM/PROM: ?  ?A/PROM Right ?09/17/2021 Left ?09/17/2021 Right ?09/23/2021 Right ?09/25/2021 Right ?09/30/21 R ?3/16  ?Shoulder flexion 80/110p! 180 115/120p! 110/125p! 130/135p! 130  ?Shoulder abduction 78/103p! 180 80/90p! 75/90p! 90/105p!   ?Shoulder internal rotation 50/65p! 90 85p! 86    ?Shoulder external rotation 50/60p! 80 60/65p! 50/55p! 60/66p!   ?Elbow flexion 130/130 130/130      ?Elbow extension 0/0 0/0      ?(Blank rows = not tested) ?  ?UPPER EXTREMITY MMT: ?  ?MMT Right ?09/17/2021 Left ?09/17/2021  ?Shoulder flexion 3/5p! 4+/5  ?Shoulder abduction 3/5p! 4+/5  ?Shoulder internal rotation 4/5 5/5  ?Shoulder external rotation 3+/5p! 5/5  ?Middle trapezius      ?Latissimus Dorsi      ?Elbow flexion 4+/5 5/5  ?Elbow extension 5/5 5/5  ?Grip strength (lbs) 42 44  ?(Blank rows = not tested) ?  ?  ?JOINT MOBILITY TESTING:  ?Mild hypomobility with minor pain with GHJ joint mobilization AP/PA/Inf ?  ?PALPATION:  ?TTP to surgical incisions at Rt shoulder ?           ?TODAY'S TREATMENT:  ? ?[place] Adult PT Treatment:                                                DATE: 10/07/2021 ?Therapeutic Exercise: ?Supine shoulder flexion AAROM with Pball 2x20 ?UBE - L 1 - 4' forward ?Chest press with pball - 20x ?UE ranger - 29'' -> 28'' Flexion 30x ?Shoulder row - 3x10 YTB ?Shoulder ext  - 3x10 YTB ?AROM with pball -  2x10 ?Sidelying ER AROM on towel roll 3x12 ?Biceps curl in supine - 3x20 2# ?Posterior capsule stretch 3x30" ? ?Manual Therapy (concentrating on increasing extensibility of restricted tissue to reduce discomfort and improve mechanics in functional movement): ?PROM R shoulder focusing on flexion ? ?[facility] Adult PT Treatment:  DATE: 10/02/2021 ?Therapeutic Exercise: ?Supine shoulder flexion AAROM with Pball 2x20 ?Chest press - 2# 2x20 ?Standing shoulder scaption AAROM with dowel 2x10 ?Standing shoulder abduction AAROM with dowel 2x10 ?Sidelying ER AROM on towel roll 3x10 ?Biceps curl in supine - 3x20 2# ?Posterior capsule stretch 3x30" ? ?Manual Therapy (concentrating on increasing extensibility of restricted tissue to reduce discomfort and improve mechanics in functional movement): ?PROM R shoulder focusing on abduction and flexion ? ?Modalities: ? ?Vasopneumatic (Game Ready)   ? ?Location:  right shoulder ?Time:  10 minutes ?Pressure:  low ?Temperature:  38 degrees ? ?[facility] Adult PT Treatment:                                                DATE: 09/30/2021 ?Therapeutic Exercise: ?Supine shoulder flexion AAROM with dowel 2x10 ?Supine shoulder ER AAROM with dowel 2x10  ?Standing shoulder scaption AAROM with dowel 2x10 ?Standing shoulder abduction AAROM with dowel 2x10 ?Sidelying ER AROM on towel roll 3x10 ?Posterior capsule stretch 3x30" ?Manual Therapy (concentrating on increasing extensibility of restricted tissue to reduce discomfort and improve mechanics in functional movement): ?PROM R shoulder focusing on abduction and flexion ? ? ?[facility] Adult PT Treatment:                                                DATE: 09/25/2021 ?Therapeutic Exercise: ?Supine shoulder flexion AAROM with dowel 2x10 ?Supine shoulder ER AAROM with dowel 2x10  ?Supine elbow flexion/extension w/towel under elbow 3x10 ?Supine forearm supination/pronation x30 ?Standing shoulder scaption AAROM with dowel  2x10 ?Posterior capsule stretch 3x30" ?Sleeper stretch Rt 2x30" ?Manual Therapy (Concentrating on increasing extensibility of restricted tissue to reduce discomfort and improve mechanics in functional movement) ?PR

## 2021-10-09 ENCOUNTER — Ambulatory Visit (INDEPENDENT_AMBULATORY_CARE_PROVIDER_SITE_OTHER): Payer: Medicare Other | Admitting: Physician Assistant

## 2021-10-09 ENCOUNTER — Encounter: Payer: Self-pay | Admitting: Physical Therapy

## 2021-10-09 ENCOUNTER — Ambulatory Visit: Payer: Medicare Other | Admitting: Physical Therapy

## 2021-10-09 ENCOUNTER — Other Ambulatory Visit: Payer: Self-pay

## 2021-10-09 ENCOUNTER — Encounter: Payer: Self-pay | Admitting: Physician Assistant

## 2021-10-09 DIAGNOSIS — Z9889 Other specified postprocedural states: Secondary | ICD-10-CM

## 2021-10-09 DIAGNOSIS — M6281 Muscle weakness (generalized): Secondary | ICD-10-CM

## 2021-10-09 DIAGNOSIS — G8929 Other chronic pain: Secondary | ICD-10-CM

## 2021-10-09 DIAGNOSIS — M25511 Pain in right shoulder: Secondary | ICD-10-CM | POA: Diagnosis not present

## 2021-10-09 MED ORDER — PREDNISONE 5 MG (21) PO TBPK: ORAL_TABLET | ORAL | 0 refills | Status: DC

## 2021-10-09 NOTE — Progress Notes (Signed)
? ?Post-Op Visit Note ?  ?Patient: Melissa Maldonado           ?Date of Birth: 11-Jul-1941           ?MRN: [id number] ?Visit Date: 10/09/2021 ?PCP: [name], MD ? ? ?Assessment & Plan: ? ?Chief Complaint:  ?Chief Complaint  ?Patient presents with  ? Right Shoulder - Pain  ? Left Shoulder - Pain  ? ?Visit Diagnoses:  ?1. S/P arthroscopy of right shoulder   ? ? ?Plan: Patient is a pleasant 81 year old female who comes in today approximately 6 weeks status post right shoulder arthroscopic debridement, decompression biceps tenotomy 08/28/2021.  She has been doing well in regards to pain but has been complaining about a cold sensation that goes from the right shoulder to the top of the left shoulder.  This occurs multiple times throughout the day without any specific aggravators.  She has been in physical therapy making good progress.  Examination of the right shoulder reveals near full active range of motion all planes.  Near full strength.  She is neurovascular tact distally.  At this point, it is hard to tell what is causing the cold sensation, but I would like to try diagnostic course of prednisone.  She will continue with physical therapy and wean to home exercise program when ready.  She will follow-up with [place] in 6 weeks time for recheck.  Call with concerns or questions. ? ?Follow-Up Instructions: Return in about 6 weeks (around 11/20/2021).  ? ?Orders:  ?No orders of the defined types were placed in this encounter. ? ?Meds ordered this encounter  ?Medications  ? predniSONE (STERAPRED UNI-PAK 21 TAB) 5 MG (21) TBPK tablet  ?  Sig: Take as directed  ?  Dispense:  21 tablet  ?  Refill:  0  ? ? ?Imaging: ?No results found. ? ?PMFS History: ?Patient Active Problem List  ? Diagnosis Date Noted  ? Arthritis of right acromioclavicular joint 08/28/2021  ? Impingement syndrome of right shoulder 08/19/2021  ? Nontraumatic incomplete tear of right rotator cuff 08/19/2021  ? Paresthesia 12/05/2020  ? Low back pain with  right-sided sciatica 12/05/2020  ? Trochanteric bursitis, right hip 04/24/2020  ? Essential hypertension 01/12/2020  ? Sinus bradycardia 01/12/2020  ? Dyspnea on exertion 01/12/2020  ? Lumbar stenosis with neurogenic claudication 02/01/2017  ? Osteoarthritis of left knee 04/27/2014  ? Tear of medial meniscus of left knee 09/08/2013  ? ?Past Medical History:  ?Diagnosis Date  ? Arthritis   ? Chronic back pain   ? lumbar stenosis  ? Early cataracts, bilateral   ? GERD (gastroesophageal reflux disease)   ? occasionally but no meds required  ? Headache(784.0)   ? occasionally  ? History of blood transfusion   ? History of colon polyps   ? Hyperlipidemia   ? takes Lipitor every other day  ? Hypertension   ? takes losartan daily  ? Neuropathy   ? Osteoarthritis of left knee 04/27/2014  ? Restless leg   ? Shortness of breath   ? with exertion  ? SOB (shortness of breath) 2013  ? CPET  ? Tear of medial meniscus of left knee 09/08/2013  ?  ?Family History  ?Problem Relation Age of Onset  ? Lupus Mother   ? Heart attack Father   ?  ?Past Surgical History:  ?Procedure Laterality Date  ? APPENDECTOMY    ? BACK SURGERY  2010  ? lumb fusion  ? COLONOSCOPY    ? EYE  SURGERY    ? both cataracts  ? KNEE ARTHROSCOPY WITH MEDIAL MENISECTOMY Left 09/08/2013  ? Procedure: LEFT KNEE ARTHROSCOPY WITH PARTIAL MEDIAL MENISCECTOMY;  Surgeon: [name], MD;  Location: [place];  Service: Orthopedics;  Laterality: Left;  ? LUMBAR LAMINECTOMY/DECOMPRESSION MICRODISCECTOMY  05/13/2012  ? Procedure: LUMBAR LAMINECTOMY/DECOMPRESSION MICRODISCECTOMY 1 LEVEL;  Surgeon: [name], MD;  Location: [place] [facility];  Service: Neurosurgery;  Laterality: Bilateral;  Bilateral Lumbar one -two Decompressive Laminectomy  ? PARTIAL KNEE ARTHROPLASTY Left 04/27/2014  ? Procedure: LEFT UNICOMPARTMENTAL KNEE;  Surgeon: [name], MD;  Location: [place];  Service: Orthopedics;  Laterality: Left;  ? UPPER GI ENDOSCOPY     ? ?Social History  ? ?Occupational History  ? Occupation: Retired  ?Tobacco Use  ? Smoking status: Never  ? Smokeless tobacco: Never  ?Substance and Sexual Activity  ? Alcohol use: Yes  ?  Comment: socially wine  ? Drug use: Never  ? Sexual activity: Yes  ?  Birth control/protection: Post-menopausal  ? ? ? ?

## 2021-10-09 NOTE — Therapy (Signed)
?OUTPATIENT PHYSICAL THERAPY TREATMENT NOTE ? ? ?Patient Name: Melissa Maldonado ?MRN: [id number] ?DOB:05-08-1941, 81 y.o., female ?Today's Date: 10/09/2021 ? ?PCP: [name], MD ?REFERRING PROVIDER: Aundra Dubin, PA-C ? ? PT End of Session - 10/09/21 1356   ? ? Visit Number 7   ? Number of Visits 17   ? Date for PT Re-Evaluation 11/12/21   ? Authorization Type MCR   ? Authorization Time Period FOTO v6, v10; KX Mod v15   ? Progress Note Due on Visit 10   ? PT Start Time 1356   ? PT Stop Time 1435   ? PT Time Calculation (min) 39 min   ? Activity Tolerance Patient tolerated treatment well;Patient limited by pain   ? Behavior During Therapy [facility] for tasks assessed/performed   ? ?  ?  ? ?  ? ? ? ? ?Past Medical History:  ?Diagnosis Date  ? Arthritis   ? Chronic back pain   ? lumbar stenosis  ? Early cataracts, bilateral   ? GERD (gastroesophageal reflux disease)   ? occasionally but no meds required  ? Headache(784.0)   ? occasionally  ? History of blood transfusion   ? History of colon polyps   ? Hyperlipidemia   ? takes Lipitor every other day  ? Hypertension   ? takes losartan daily  ? Neuropathy   ? Osteoarthritis of left knee 04/27/2014  ? Restless leg   ? Shortness of breath   ? with exertion  ? SOB (shortness of breath) 2013  ? CPET  ? Tear of medial meniscus of left knee 09/08/2013  ? ?Past Surgical History:  ?Procedure Laterality Date  ? APPENDECTOMY    ? BACK SURGERY  2010  ? lumb fusion  ? COLONOSCOPY    ? EYE SURGERY    ? both cataracts  ? KNEE ARTHROSCOPY WITH MEDIAL MENISECTOMY Left 09/08/2013  ? Procedure: LEFT KNEE ARTHROSCOPY WITH PARTIAL MEDIAL MENISCECTOMY;  Surgeon: [name], MD;  Location: [place];  Service: Orthopedics;  Laterality: Left;  ? LUMBAR LAMINECTOMY/DECOMPRESSION MICRODISCECTOMY  05/13/2012  ? Procedure: LUMBAR LAMINECTOMY/DECOMPRESSION MICRODISCECTOMY 1 LEVEL;  Surgeon: [name], MD;  Location: [place] [facility];  Service: Neurosurgery;  Laterality: Bilateral;   Bilateral Lumbar one -two Decompressive Laminectomy  ? PARTIAL KNEE ARTHROPLASTY Left 04/27/2014  ? Procedure: LEFT UNICOMPARTMENTAL KNEE;  Surgeon: [name], MD;  Location: [place];  Service: Orthopedics;  Laterality: Left;  ? UPPER GI ENDOSCOPY    ? ?Patient Active Problem List  ? Diagnosis Date Noted  ? Arthritis of right acromioclavicular joint 08/28/2021  ? Impingement syndrome of right shoulder 08/19/2021  ? Nontraumatic incomplete tear of right rotator cuff 08/19/2021  ? Paresthesia 12/05/2020  ? Low back pain with right-sided sciatica 12/05/2020  ? Trochanteric bursitis, right hip 04/24/2020  ? Essential hypertension 01/12/2020  ? Sinus bradycardia 01/12/2020  ? Dyspnea on exertion 01/12/2020  ? Lumbar stenosis with neurogenic claudication 02/01/2017  ? Osteoarthritis of left knee 04/27/2014  ? Tear of medial meniscus of left knee 09/08/2013  ? ? ?REFERRING DIAG: Z98.890 (ICD-10-CM) - S/P arthroscopy of right shoulder ? ?THERAPY DIAG:  ?Chronic right shoulder pain ? ?Muscle weakness (generalized) ? ?PERTINENT HISTORY: Rt shoulder debridement, SAD, and biceps tenotomy 08/28/2021 ?L1-L2 lumbar laminectomy/ decompression in 2013 ? ?PRECAUTIONS: Shoulder; Rt shoulder biceps tenodemy ([facility]) ? ?SUBJECTIVE: ?Pt went to her ortho follow up today.  She was put on prednisone to try and help  the cold feeling she has in her shoulders ? ? ?PAIN:  ?Are you having pain? Yes ?NPRS scale: 6/10 ?Pain location: Rt shoulder ?PAIN TYPE: aching and sharp ?Pain description: intermittent  ?Aggravating factors: lifting arm overhead and quick movements ?Relieving factors: pain medication, ice, and rest ? ? ? ? ?OBJECTIVE:  ?  ?DIAGNOSTIC FINDINGS:  ?None after surgery. ?  ?Prior to surgery: ?05/31/2021: MR Shoulder Rt without contrast: IMPRESSION: ?1. Diffuse severe supraspinatus tendinosis with full-thickness tear ?of the anterior tendon proximal to the insertion. ?2. Severe distal  infraspinatus tendinosis with small intrasubstance ?tear. ?3. Moderate distal subscapularis tendinosis with small ?intrasubstance tear. ?4. Severe biceps tendinosis with medial subluxation and small ?partial tear within the bicipital groove. ?5. Severe acromioclavicular and mild glenohumeral osteoarthritis. ?  ?PATIENT SURVEYS:  ?FOTO 53%, predicted 67% in 15 visits ?  ?COGNITION: ?         Overall cognitive status: Within functional limits for tasks assessed ?                              ?SENSATION: ?         Light touch: Appears intact ?  ?POSTURE: ?Forward head, BIL rounded shoulders ?  ?UPPER EXTREMITY AROM/PROM: ?  ?A/PROM Right ?09/17/2021 Left ?09/17/2021 Right ?09/23/2021 Right ?09/25/2021 Right ?09/30/21 R ?3/16 R ?3/23  ?Shoulder flexion 80/110p! 180 115/120p! 110/125p! 130/135p! 130   ?Shoulder abduction 78/103p! 180 80/90p! 75/90p! 90/105p!    ?Shoulder internal rotation 50/65p! 90 85p! 86     ?Shoulder external rotation 50/60p! 80 60/65p! 50/55p! 60/66p!  Full no pain in normal range  ?Elbow flexion 130/130 130/130       ?Elbow extension 0/0 0/0       ?(Blank rows = not tested) ?  ?UPPER EXTREMITY MMT: ?  ?MMT Right ?09/17/2021 Left ?09/17/2021  ?Shoulder flexion 3/5p! 4+/5  ?Shoulder abduction 3/5p! 4+/5  ?Shoulder internal rotation 4/5 5/5  ?Shoulder external rotation 3+/5p! 5/5  ?Middle trapezius      ?Latissimus Dorsi      ?Elbow flexion 4+/5 5/5  ?Elbow extension 5/5 5/5  ?Grip strength (lbs) 42 44  ?(Blank rows = not tested) ?  ?  ?JOINT MOBILITY TESTING:  ?Mild hypomobility with minor pain with GHJ joint mobilization AP/PA/Inf ?  ?PALPATION:  ?TTP to surgical incisions at Rt shoulder ?           ?TODAY'S TREATMENT:  ? ?[place] Adult PT Treatment:                                                DATE: 10/09/2021 ?Therapeutic Exercise: ?Supine shoulder flexion AAROM with Pball 2x20 ?UBE - L 1 - 4' forward ?Chest press with pball - 3x10 ?UE ranger - 40'' -> 28'' Flexion 30x ?Shoulder row - 3x10 YTB ?Shoulder ext  -  3x10 YTB ?Tricep push down - 3x10 - YTB ?AAROM in supine to about 100 degrees- 2x10 ?Sidelying ER AROM on towel roll 3x15 ?Biceps curl in supine - 3x20 2# ?Posterior capsule stretch 3x45" ? ?Manual Therapy (concentrating on increasing extensibility of restricted tissue to reduce discomfort and improve mechanics in functional movement): ?PROM R shoulder focusing on flexion ? ?[facility] Adult PT Treatment:  DATE: 10/07/2021 ?Therapeutic Exercise: ?Supine shoulder flexion AAROM with Pball 2x20 ?UBE - L 1 - 4' forward ?Chest press with pball - 20x ?UE ranger - 61'' -> 28'' Flexion 30x ?Shoulder row - 3x10 YTB ?Shoulder ext  - 3x10 YTB ?AROM with pball - 2x10 ?Sidelying ER AROM on towel roll 3x12 ?Biceps curl in supine - 3x20 2# ?Posterior capsule stretch 3x30" ? ?Manual Therapy (concentrating on increasing extensibility of restricted tissue to reduce discomfort and improve mechanics in functional movement): ?PROM R shoulder focusing on flexion ? ?[facility] Adult PT Treatment:                                                DATE: 10/02/2021 ?Therapeutic Exercise: ?Supine shoulder flexion AAROM with Pball 2x20 ?Chest press - 2# 2x20 ?Standing shoulder scaption AAROM with dowel 2x10 ?Standing shoulder abduction AAROM with dowel 2x10 ?Sidelying ER AROM on towel roll 3x10 ?Biceps curl in supine - 3x20 2# ?Posterior capsule stretch 3x30" ? ?Manual Therapy (concentrating on increasing extensibility of restricted tissue to reduce discomfort and improve mechanics in functional movement): ?PROM R shoulder focusing on abduction and flexion ? ?Modalities: ? ?Vasopneumatic ([employer])   ? ?Location:  right shoulder ?Time:  10 minutes ?Pressure:  low ?Temperature:  38 degrees ? ?[facility] Adult PT Treatment:                                                DATE: 09/30/2021 ?Therapeutic Exercise: ?Supine shoulder flexion AAROM with dowel 2x10 ?Supine shoulder ER AAROM with dowel 2x10  ?Standing shoulder  scaption AAROM with dowel 2x10 ?Standing shoulder abduction AAROM with dowel 2x10 ?Sidelying ER AROM on towel roll 3x10 ?Posterior capsule stretch 3x30" ?Manual Therapy (concentrating on increasing extensibility

## 2021-10-14 ENCOUNTER — Ambulatory Visit: Payer: Medicare Other | Admitting: Physical Therapy

## 2021-10-14 ENCOUNTER — Other Ambulatory Visit: Payer: Self-pay

## 2021-10-14 ENCOUNTER — Encounter: Payer: Self-pay | Admitting: Physical Therapy

## 2021-10-14 DIAGNOSIS — Z9889 Other specified postprocedural states: Secondary | ICD-10-CM | POA: Diagnosis not present

## 2021-10-14 DIAGNOSIS — M25511 Pain in right shoulder: Secondary | ICD-10-CM | POA: Diagnosis not present

## 2021-10-14 DIAGNOSIS — M6281 Muscle weakness (generalized): Secondary | ICD-10-CM

## 2021-10-14 DIAGNOSIS — G8929 Other chronic pain: Secondary | ICD-10-CM

## 2021-10-14 NOTE — Therapy (Signed)
?OUTPATIENT PHYSICAL THERAPY TREATMENT NOTE ? ? ?Patient Name: Melissa Maldonado ?MRN: [id number] ?DOB:May 04, 1941, 81 y.o., female ?Today's Date: 10/14/2021 ? ?PCP: [name], MD ?REFERRING PROVIDER: Aundra Dubin, PA-C ? ? PT End of Session - 10/14/21 1527   ? ? Visit Number 8   ? Number of Visits 17   ? Date for PT Re-Evaluation 11/12/21   ? Authorization Type MCR   ? Authorization Time Period FOTO v6, v10; KX Mod v15   ? Progress Note Due on Visit 10   ? PT Start Time 1530   ? PT Stop Time 1610   ? PT Time Calculation (min) 40 min   ? Activity Tolerance Patient tolerated treatment well;Patient limited by pain   ? Behavior During Therapy [facility] for tasks assessed/performed   ? ?  ?  ? ?  ? ? ? ? ?Past Medical History:  ?Diagnosis Date  ? Arthritis   ? Chronic back pain   ? lumbar stenosis  ? Early cataracts, bilateral   ? GERD (gastroesophageal reflux disease)   ? occasionally but no meds required  ? Headache(784.0)   ? occasionally  ? History of blood transfusion   ? History of colon polyps   ? Hyperlipidemia   ? takes Lipitor every other day  ? Hypertension   ? takes losartan daily  ? Neuropathy   ? Osteoarthritis of left knee 04/27/2014  ? Restless leg   ? Shortness of breath   ? with exertion  ? SOB (shortness of breath) 2013  ? CPET  ? Tear of medial meniscus of left knee 09/08/2013  ? ?Past Surgical History:  ?Procedure Laterality Date  ? APPENDECTOMY    ? BACK SURGERY  2010  ? lumb fusion  ? COLONOSCOPY    ? EYE SURGERY    ? both cataracts  ? KNEE ARTHROSCOPY WITH MEDIAL MENISECTOMY Left 09/08/2013  ? Procedure: LEFT KNEE ARTHROSCOPY WITH PARTIAL MEDIAL MENISCECTOMY;  Surgeon: [name], MD;  Location: [place];  Service: Orthopedics;  Laterality: Left;  ? LUMBAR LAMINECTOMY/DECOMPRESSION MICRODISCECTOMY  05/13/2012  ? Procedure: LUMBAR LAMINECTOMY/DECOMPRESSION MICRODISCECTOMY 1 LEVEL;  Surgeon: [name], MD;  Location: [place] [facility];  Service: Neurosurgery;  Laterality: Bilateral;   Bilateral Lumbar one -two Decompressive Laminectomy  ? PARTIAL KNEE ARTHROPLASTY Left 04/27/2014  ? Procedure: LEFT UNICOMPARTMENTAL KNEE;  Surgeon: [name], MD;  Location: [place];  Service: Orthopedics;  Laterality: Left;  ? UPPER GI ENDOSCOPY    ? ?Patient Active Problem List  ? Diagnosis Date Noted  ? Arthritis of right acromioclavicular joint 08/28/2021  ? Impingement syndrome of right shoulder 08/19/2021  ? Nontraumatic incomplete tear of right rotator cuff 08/19/2021  ? Paresthesia 12/05/2020  ? Low back pain with right-sided sciatica 12/05/2020  ? Trochanteric bursitis, right hip 04/24/2020  ? Essential hypertension 01/12/2020  ? Sinus bradycardia 01/12/2020  ? Dyspnea on exertion 01/12/2020  ? Lumbar stenosis with neurogenic claudication 02/01/2017  ? Osteoarthritis of left knee 04/27/2014  ? Tear of medial meniscus of left knee 09/08/2013  ? ? ?REFERRING DIAG: Z98.890 (ICD-10-CM) - S/P arthroscopy of right shoulder ? ?THERAPY DIAG:  ?Chronic right shoulder pain ? ?Muscle weakness (generalized) ? ?PERTINENT HISTORY: Rt shoulder debridement, SAD, and biceps tenotomy 08/28/2021 ?L1-L2 lumbar laminectomy/ decompression in 2013 ? ?PRECAUTIONS: Shoulder; Rt shoulder biceps tenodemy ([facility]) ? ?SUBJECTIVE: ?She reports that the prednisone taper has helped her shoulder pain and the cold feeling in her bil  shoulders. ? ? ?PAIN:  ?Are you having pain? Yes ?NPRS scale: 0/10 ?Pain location: Rt shoulder ?PAIN TYPE: aching and sharp ?Pain description: intermittent  ?Aggravating factors: lifting arm overhead and quick movements ?Relieving factors: pain medication, ice, and rest ? ? ? ? ?OBJECTIVE:  ?  ?DIAGNOSTIC FINDINGS:  ?None after surgery. ?  ?Prior to surgery: ?05/31/2021: MR Shoulder Rt without contrast: IMPRESSION: ?1. Diffuse severe supraspinatus tendinosis with full-thickness tear ?of the anterior tendon proximal to the insertion. ?2. Severe distal infraspinatus  tendinosis with small intrasubstance ?tear. ?3. Moderate distal subscapularis tendinosis with small ?intrasubstance tear. ?4. Severe biceps tendinosis with medial subluxation and small ?partial tear within the bicipital groove. ?5. Severe acromioclavicular and mild glenohumeral osteoarthritis. ?  ?PATIENT SURVEYS:  ?FOTO 53%, predicted 67% in 15 visits ?  ?COGNITION: ?         Overall cognitive status: Within functional limits for tasks assessed ?                              ?SENSATION: ?         Light touch: Appears intact ?  ?POSTURE: ?Forward head, BIL rounded shoulders ?  ?UPPER EXTREMITY AROM/PROM: ?  ?A/PROM Right ?09/17/2021 Left ?09/17/2021 Right ?09/23/2021 Right ?09/25/2021 Right ?09/30/21 R ?3/16 R ?3/23 R ?3/23  ?Shoulder flexion 80/110p! 180 115/120p! 110/125p! 130/135p! 130  130/140 no pain  ?Shoulder abduction 78/103p! 180 80/90p! 75/90p! 90/105p!     ?Shoulder internal rotation 50/65p! 90 85p! 86      ?Shoulder external rotation 50/60p! 80 60/65p! 50/55p! 60/66p!  Full no pain in normal range   ?Elbow flexion 130/130 130/130        ?Elbow extension 0/0 0/0        ?(Blank rows = not tested) ?  ?UPPER EXTREMITY MMT: ?  ?MMT Right ?09/17/2021 Left ?09/17/2021  ?Shoulder flexion 3/5p! 4+/5  ?Shoulder abduction 3/5p! 4+/5  ?Shoulder internal rotation 4/5 5/5  ?Shoulder external rotation 3+/5p! 5/5  ?Middle trapezius      ?Latissimus Dorsi      ?Elbow flexion 4+/5 5/5  ?Elbow extension 5/5 5/5  ?Grip strength (lbs) 42 44  ?(Blank rows = not tested) ?  ?  ?JOINT MOBILITY TESTING:  ?Mild hypomobility with minor pain with GHJ joint mobilization AP/PA/Inf ?  ?PALPATION:  ?TTP to surgical incisions at Rt shoulder ?           ?TODAY'S TREATMENT:  ? ?[place] Adult PT Treatment:                                                DATE: 10/14/2021 ?Therapeutic Exercise: ? ?UBE - L 2 - 4' forward ?Chest press with 3# - 3x10 ?SA punch circles CC/CW - 2x20 ea ?UE ranger - 26'' -> 28'' flexion and abd - 20x ?Shoulder row - 3x10  RTB ?Shoulder ext  - 3x10 RTB ?Tricep push down - 3x10 - RTB ?IR - RTB ?AAROM in supine to about 100 degrees- 3x10 ?Sidelying ER AROM on towel roll 4x10 - 1# ?Biceps curl in supine - 3x20 3# ?Towel slide with lift off - 2x10 flexion  ?Posterior capsule stretch 3x45" (NT) ? ?Manual Therapy (concentrating on increasing extensibility of restricted tissue to reduce discomfort and improve mechanics in functional movement): ?PROM R shoulder focusing on flexion ? ?  [facility] Adult PT Treatment:                                                DATE: 10/09/2021 ?Therapeutic Exercise: ?Supine shoulder flexion AAROM with Pball 2x20 ?UBE - L 1 - 4' forward ?Chest press with pball - 3x10 ?UE ranger - 58'' -> 28'' Flexion 30x ?Shoulder row - 3x10 YTB ?Shoulder ext  - 3x10 YTB ?Tricep push down - 3x10 - YTB ?AAROM in supine to about 100 degrees- 2x10 ?Sidelying ER AROM on towel roll 3x15 ?Biceps curl in supine - 3x20 3# ?Posterior capsule stretch 3x45" ? ?Manual Therapy (concentrating on increasing extensibility of restricted tissue to reduce discomfort and improve mechanics in functional movement): ?PROM R shoulder focusing on flexion ? ?[facility] Adult PT Treatment:                                                DATE: 10/07/2021 ?Therapeutic Exercise: ?Supine shoulder flexion AAROM with Pball 2x20 ?UBE - L 1 - 4' forward ?Chest press with pball - 20x ?UE ranger - 45'' -> 28'' Flexion 30x ?Shoulder row - 3x10 YTB ?Shoulder ext  - 3x10 YTB ?AROM with pball - 2x10 ?Sidelying ER AROM on towel roll 3x12 ?Biceps curl in supine - 3x20 2# ?Posterior capsule stretch 3x30" ? ?Manual Therapy (concentrating on increasing extensibility of restricted tissue to reduce discomfort and improve mechanics in functional movement): ?PROM R shoulder focusing on flexion ? ?[facility] Adult PT Treatment:                                                DATE: 10/02/2021 ?Therapeutic Exercise: ?Supine shoulder flexion AAROM with Pball 2x20 ?Chest press - 2# 2x20 ?Standing  shoulder scaption AAROM with dowel 2x10 ?Standing shoulder abduction AAROM with dowel 2x10 ?Sidelying ER AROM on towel roll 3x10 ?Biceps curl in supine - 3x20 2# ?Posterior capsule stretch 3x30" ? ?Manual Therapy (concentr

## 2021-10-16 ENCOUNTER — Encounter: Payer: Self-pay | Admitting: Physical Therapy

## 2021-10-16 ENCOUNTER — Ambulatory Visit: Payer: Medicare Other | Admitting: Physical Therapy

## 2021-10-16 DIAGNOSIS — M6281 Muscle weakness (generalized): Secondary | ICD-10-CM

## 2021-10-16 DIAGNOSIS — Z9889 Other specified postprocedural states: Secondary | ICD-10-CM | POA: Diagnosis not present

## 2021-10-16 DIAGNOSIS — G8929 Other chronic pain: Secondary | ICD-10-CM | POA: Diagnosis not present

## 2021-10-16 DIAGNOSIS — M25511 Pain in right shoulder: Secondary | ICD-10-CM | POA: Diagnosis not present

## 2021-10-16 NOTE — Therapy (Signed)
?Progress Note ?Reporting Period 03/01 to 3/28 ? ?See note below for Objective Data and Assessment of Progress/Goals.  ? ? ? ? ?Patient Name: Melissa Maldonado ?MRN: [id number] ?DOB:1940/08/09, 81 y.o., female ?Today's Date: 10/16/2021 ? ?PCP: [name], MD ?REFERRING PROVIDER: Aundra Dubin, PA-C ? ? PT End of Session - 10/16/21 1530   ? ? Visit Number 9   ? Number of Visits 17   ? Date for PT Re-Evaluation 11/12/21   ? Authorization Type MCR   ? Authorization Time Period FOTO v6, v10; KX Mod v15   ? Progress Note Due on Visit 10   ? PT Start Time 1530   ? PT Stop Time 1612   ? PT Time Calculation (min) 42 min   ? Activity Tolerance Patient tolerated treatment well;Patient limited by pain   ? Behavior During Therapy [facility] for tasks assessed/performed   ? ?  ?  ? ?  ? ? ? ? ?Past Medical History:  ?Diagnosis Date  ? Arthritis   ? Chronic back pain   ? lumbar stenosis  ? Early cataracts, bilateral   ? GERD (gastroesophageal reflux disease)   ? occasionally but no meds required  ? Headache(784.0)   ? occasionally  ? History of blood transfusion   ? History of colon polyps   ? Hyperlipidemia   ? takes Lipitor every other day  ? Hypertension   ? takes losartan daily  ? Neuropathy   ? Osteoarthritis of left knee 04/27/2014  ? Restless leg   ? Shortness of breath   ? with exertion  ? SOB (shortness of breath) 2013  ? CPET  ? Tear of medial meniscus of left knee 09/08/2013  ? ?Past Surgical History:  ?Procedure Laterality Date  ? APPENDECTOMY    ? BACK SURGERY  2010  ? lumb fusion  ? COLONOSCOPY    ? EYE SURGERY    ? both cataracts  ? KNEE ARTHROSCOPY WITH MEDIAL MENISECTOMY Left 09/08/2013  ? Procedure: LEFT KNEE ARTHROSCOPY WITH PARTIAL MEDIAL MENISCECTOMY;  Surgeon: [name], MD;  Location: [place];  Service: Orthopedics;  Laterality: Left;  ? LUMBAR LAMINECTOMY/DECOMPRESSION MICRODISCECTOMY  05/13/2012  ? Procedure: LUMBAR LAMINECTOMY/DECOMPRESSION MICRODISCECTOMY 1 LEVEL;  Surgeon: [name],  MD;  Location: [place] [facility];  Service: Neurosurgery;  Laterality: Bilateral;  Bilateral Lumbar one -two Decompressive Laminectomy  ? PARTIAL KNEE ARTHROPLASTY Left 04/27/2014  ? Procedure: LEFT UNICOMPARTMENTAL KNEE;  Surgeon: [name], MD;  Location: [place];  Service: Orthopedics;  Laterality: Left;  ? UPPER GI ENDOSCOPY    ? ?Patient Active Problem List  ? Diagnosis Date Noted  ? Arthritis of right acromioclavicular joint 08/28/2021  ? Impingement syndrome of right shoulder 08/19/2021  ? Nontraumatic incomplete tear of right rotator cuff 08/19/2021  ? Paresthesia 12/05/2020  ? Low back pain with right-sided sciatica 12/05/2020  ? Trochanteric bursitis, right hip 04/24/2020  ? Essential hypertension 01/12/2020  ? Sinus bradycardia 01/12/2020  ? Dyspnea on exertion 01/12/2020  ? Lumbar stenosis with neurogenic claudication 02/01/2017  ? Osteoarthritis of left knee 04/27/2014  ? Tear of medial meniscus of left knee 09/08/2013  ? ? ?REFERRING DIAG: Z98.890 (ICD-10-CM) - S/P arthroscopy of right shoulder ? ?THERAPY DIAG:  ?Chronic right shoulder pain ? ?Muscle weakness (generalized) ? ?PERTINENT HISTORY: Rt shoulder debridement, SAD, and biceps tenotomy 08/28/2021 ?L1-L2 lumbar laminectomy/ decompression in 2013 ? ?PRECAUTIONS: Shoulder; Rt shoulder biceps tenodemy ([facility]) ? ?SUBJECTIVE: ?Pt reports  that her shoulder is a little more sore today, possibly because she is now done with the prednisone taper.  She feels like she has made some good progress with PT. ? ? ?PAIN:  ?Are you having pain? Yes ?NPRS scale: 6/10 ?Pain location: Rt shoulder ?PAIN TYPE: aching and sharp ?Pain description: intermittent  ?Aggravating factors: lifting arm overhead and quick movements ?Relieving factors: pain medication, ice, and rest ? ? ? ? ?OBJECTIVE:  ?  ?DIAGNOSTIC FINDINGS:  ?None after surgery. ?  ?Prior to surgery: ?05/31/2021: MR Shoulder Rt without contrast: IMPRESSION: ?1. Diffuse  severe supraspinatus tendinosis with full-thickness tear ?of the anterior tendon proximal to the insertion. ?2. Severe distal infraspinatus tendinosis with small intrasubstance ?tear. ?3. Moderate distal subscapularis tendinosis with small ?intrasubstance tear. ?4. Severe biceps tendinosis with medial subluxation and small ?partial tear within the bicipital groove. ?5. Severe acromioclavicular and mild glenohumeral osteoarthritis. ?  ?PATIENT SURVEYS:  ?FOTO 53%, predicted 67% in 15 visits ?  ?COGNITION: ?         Overall cognitive status: Within functional limits for tasks assessed ?                              ?SENSATION: ?         Light touch: Appears intact ?  ?POSTURE: ?Forward head, BIL rounded shoulders ?  ?UPPER EXTREMITY AROM/PROM: ?  ?A/PROM Right ?09/17/2021 Left ?09/17/2021 Right ?09/23/2021 Right ?09/25/2021 Right ?09/30/21 R ?3/16 R ?3/23 R ?3/23  ?Shoulder flexion 80/110p! 180 115/120p! 110/125p! 130/135p! 130  130/140 no pain  ?Shoulder abduction 78/103p! 180 80/90p! 75/90p! 90/105p!     ?Shoulder internal rotation 50/65p! 90 85p! 86      ?Shoulder external rotation 50/60p! 80 60/65p! 50/55p! 60/66p!  Full no pain in normal range   ?Elbow flexion 130/130 130/130        ?Elbow extension 0/0 0/0        ?(Blank rows = not tested) ?  ?UPPER EXTREMITY MMT: ?  ?MMT Right ?09/17/2021 Left ?09/17/2021  ?Shoulder flexion 3/5p! 4+/5  ?Shoulder abduction 3/5p! 4+/5  ?Shoulder internal rotation 4/5 5/5  ?Shoulder external rotation 3+/5p! 5/5  ?Middle trapezius      ?Latissimus Dorsi      ?Elbow flexion 4+/5 5/5  ?Elbow extension 5/5 5/5  ?Grip strength (lbs) 42 44  ?(Blank rows = not tested) ?  ?  ?JOINT MOBILITY TESTING:  ?Mild hypomobility with minor pain with GHJ joint mobilization AP/PA/Inf ?  ?PALPATION:  ?TTP to surgical incisions at Rt shoulder ?           ?TODAY'S TREATMENT:  ? ?[facility] Adult PT Treatment:                                                DATE: 10/16/2021 ?Therapeutic Exercise: ? ?UBE - L 2 - 4'  forward ?Chest press with 3# - 3x10 ?SA punch circles CC/CW - 2x20 ea ?UE ranger - 26'' -> 28'' flexion and abd - 20x ?Shoulder row - 3x10 RTB ?Shoulder ext  - 3x10 RTB ?Tricep push down - 3x10 - RTB ?IR - RTB - 3x10 ?ER - YTB - 3x10 ?AAROM in supine to about 120 degrees- 3x10 ?Biceps curl in supine - 3x20 3# ?Towel slide - 2x10 flexion and scaption ?Posterior capsule stretch 3x45" ? ?  Manual Therapy (concentrating on increasing extensibility of restricted tissue to reduce discomfort and improve mechanics in functional movement): ?PROM R shoulder focusing on flexion ?Inferior and posterior capsule stretch G III ? ?Therapeutic Activity ?- collecting information for goals, checking progress, and reviewing with patient ? ?[facility] Adult PT Treatment:                                                DATE: 10/14/2021 ?Therapeutic Exercise: ? ?UBE - L 2 - 4' forward ?Chest press with 4# - 3x10 ?SA punch circles CC/CW - 2x20 ea ?UE ranger - 26'' -> 28'' flexion and abd - 20x ?Shoulder row - 3x10 RTB ?Shoulder ext  - 3x10 RTB ?Tricep push down - 3x10 - RTB ?IR - RTB - 3x10 ?AAROM in supine to about 100 degrees- 3x10 ?Sidelying ER AROM on towel roll 4x10 - 1# ?Biceps curl in supine - 3x20 3# ?Towel slide with lift off - 2x10 flexion  ?Posterior capsule stretch 3x45" ? ?Manual Therapy (concentrating on increasing extensibility of restricted tissue to reduce discomfort and improve mechanics in functional movement): ?PROM R shoulder focusing on flexion ? ?[facility] Adult PT Treatment:                                                DATE: 10/09/2021 ?Therapeutic Exercise: ?Supine shoulder flexion AAROM with Pball 2x20 ?UBE - L 1 - 4' forward ?Chest press with pball - 3x10 ?UE ranger - 60'' -> 28'' Flexion 30x ?Shoulder row - 3x10 YTB ?Shoulder ext  - 3x10 YTB ?Tricep push down - 3x10 - YTB ?AAROM in supine to about 100 degrees- 2x10 ?Sidelying ER AROM on towel roll 3x15 ?Biceps curl in supine - 3x20 3# ?Posterior capsule stretch  3x45" ? ?Manual Therapy (concentrating on increasing extensibility of restricted tissue to reduce discomfort and improve mechanics in functional movement): ?PROM R shoulder focusing on flexion ? ?[facility] Adult PT Treatment:

## 2021-10-18 NOTE — Therapy (Signed)
? ?OUTPATIENT PHYSICAL THERAPY TREATMENT NOTE ? ? ?Patient Name: Melissa Maldonado ?MRN: [id number] ?DOB:1940-12-23, 81 y.o., female ?Today's Date: 10/18/2021 ? ?PCP: [name], MD ?REFERRING PROVIDER: Aundra Dubin, PA-C ? ? ? ? ? ? ?Past Medical History:  ?Diagnosis Date  ? Arthritis   ? Chronic back pain   ? lumbar stenosis  ? Early cataracts, bilateral   ? GERD (gastroesophageal reflux disease)   ? occasionally but no meds required  ? Headache(784.0)   ? occasionally  ? History of blood transfusion   ? History of colon polyps   ? Hyperlipidemia   ? takes Lipitor every other day  ? Hypertension   ? takes losartan daily  ? Neuropathy   ? Osteoarthritis of left knee 04/27/2014  ? Restless leg   ? Shortness of breath   ? with exertion  ? SOB (shortness of breath) 2013  ? CPET  ? Tear of medial meniscus of left knee 09/08/2013  ? ?Past Surgical History:  ?Procedure Laterality Date  ? APPENDECTOMY    ? BACK SURGERY  2010  ? lumb fusion  ? COLONOSCOPY    ? EYE SURGERY    ? both cataracts  ? KNEE ARTHROSCOPY WITH MEDIAL MENISECTOMY Left 09/08/2013  ? Procedure: LEFT KNEE ARTHROSCOPY WITH PARTIAL MEDIAL MENISCECTOMY;  Surgeon: [name], MD;  Location: [place];  Service: Orthopedics;  Laterality: Left;  ? LUMBAR LAMINECTOMY/DECOMPRESSION MICRODISCECTOMY  05/13/2012  ? Procedure: LUMBAR LAMINECTOMY/DECOMPRESSION MICRODISCECTOMY 1 LEVEL;  Surgeon: [name], MD;  Location: [place] [facility];  Service: Neurosurgery;  Laterality: Bilateral;  Bilateral Lumbar one -two Decompressive Laminectomy  ? PARTIAL KNEE ARTHROPLASTY Left 04/27/2014  ? Procedure: LEFT UNICOMPARTMENTAL KNEE;  Surgeon: [name], MD;  Location: [place];  Service: Orthopedics;  Laterality: Left;  ? UPPER GI ENDOSCOPY    ? ?Patient Active Problem List  ? Diagnosis Date Noted  ? Arthritis of right acromioclavicular joint 08/28/2021  ? Impingement syndrome of right shoulder 08/19/2021  ? Nontraumatic incomplete tear  of right rotator cuff 08/19/2021  ? Paresthesia 12/05/2020  ? Low back pain with right-sided sciatica 12/05/2020  ? Trochanteric bursitis, right hip 04/24/2020  ? Essential hypertension 01/12/2020  ? Sinus bradycardia 01/12/2020  ? Dyspnea on exertion 01/12/2020  ? Lumbar stenosis with neurogenic claudication 02/01/2017  ? Osteoarthritis of left knee 04/27/2014  ? Tear of medial meniscus of left knee 09/08/2013  ? ? ?REFERRING DIAG: Z98.890 (ICD-10-CM) - S/P arthroscopy of right shoulder ? ?THERAPY DIAG:  ?No diagnosis found. ? ?PERTINENT HISTORY: Rt shoulder debridement, SAD, and biceps tenotomy 08/28/2021 ?L1-L2 lumbar laminectomy/ decompression in 2013 ? ?PRECAUTIONS: Shoulder; Rt shoulder biceps tenodemy ([facility]) ? ?SUBJECTIVE: Pt reports she had a massage this morning and is feeling good today. ? ? ?PAIN:  ?Are you having pain? Yes ?NPRS scale: 4/10 ?Pain location: Rt shoulder ?PAIN TYPE: aching and sharp ?Pain description: intermittent  ?Aggravating factors: lifting arm overhead and quick movements ?Relieving factors: pain medication, ice, and rest ? ? ? ? ?OBJECTIVE:  ?  ?DIAGNOSTIC FINDINGS:  ?None after surgery. ?  ?Prior to surgery: ?05/31/2021: MR Shoulder Rt without contrast: IMPRESSION: ?1. Diffuse severe supraspinatus tendinosis with full-thickness tear ?of the anterior tendon proximal to the insertion. ?2. Severe distal infraspinatus tendinosis with small intrasubstance ?tear. ?3. Moderate distal subscapularis tendinosis with small ?intrasubstance tear. ?4. Severe biceps tendinosis with medial subluxation and small ?partial tear within the bicipital groove. ?5. Severe acromioclavicular and mild  glenohumeral osteoarthritis. ?  ?PATIENT SURVEYS:  ?FOTO 53%, predicted 67% in 15 visits ?  ?COGNITION: ?         Overall cognitive status: Within functional limits for tasks assessed ?                              ?SENSATION: ?         Light touch: Appears intact ?  ?POSTURE: ?Forward head,  BIL rounded shoulders ?  ?UPPER EXTREMITY AROM/PROM: ?  ?A/PROM Right ?09/17/2021 Left ?09/17/2021 Right ?09/23/2021 Right ?09/25/2021 Right ?09/30/21 R ?3/16 R ?3/23 R ?3/23  ?Shoulder flexion 80/110p! 180 115/120p! 110/125p! 130/135p! 130  130/140 no pain  ?Shoulder abduction 78/103p! 180 80/90p! 75/90p! 90/105p!     ?Shoulder internal rotation 50/65p! 90 85p! 86      ?Shoulder external rotation 50/60p! 80 60/65p! 50/55p! 60/66p!  Full no pain in normal range   ?Elbow flexion 130/130 130/130        ?Elbow extension 0/0 0/0        ?(Blank rows = not tested) ?  ?UPPER EXTREMITY MMT: ?  ?MMT Right ?09/17/2021 Left ?09/17/2021  ?Shoulder flexion 3/5p! 4+/5  ?Shoulder abduction 3/5p! 4+/5  ?Shoulder internal rotation 4/5 5/5  ?Shoulder external rotation 3+/5p! 5/5  ?Middle trapezius      ?Latissimus Dorsi      ?Elbow flexion 4+/5 5/5  ?Elbow extension 5/5 5/5  ?Grip strength (lbs) 42 44  ?(Blank rows = not tested) ?  ?  ?JOINT MOBILITY TESTING:  ?Mild hypomobility with minor pain with GHJ joint mobilization AP/PA/Inf ?  ?PALPATION:  ?TTP to surgical incisions at Rt shoulder ?           ?TODAY'S TREATMENT:  ?[facility] Adult PT Treatment:                                                DATE: 10/21/2021 ?Therapeutic Exercise: ?UBE - L2 - 5' forward ?Pball roll up wall with lift off x10 ?SA punch circles CC/CW - x20 ea ?UE ranger - 28'' flexion and abd - 20x ?Shoulder row - 3x10 RTB ?Shoulder ext  - 3x10 RTB ?Tricep push down - 3x10 - RTB ?IR - RTB - 3x10 Rt ?ER - YTB - 3x10 Rt ?Biceps curl in supine - 3x20 3# ?Posterior capsule stretch 3x30" Rt ?Manual Therapy (concentrating on increasing extensibility of restricted tissue to reduce discomfort and improve mechanics in functional movement): ?PROM R shoulder focusing on flexion ? ? ?[facility] Adult PT Treatment:                                                DATE: 10/16/2021 ?Therapeutic Exercise: ? ?UBE - L 2 - 4' forward ?Chest press with 3# - 3x10 ?SA punch circles CC/CW - 2x20 ea ?UE ranger -  26'' -> 28'' flexion and abd - 20x ?Shoulder row - 3x10 RTB ?Shoulder ext  - 3x10 RTB ?Tricep push down - 3x10 - RTB ?IR - RTB - 3x10 ?ER - YTB - 3x10 ?AAROM in supine to about 120 degrees- 3x10 ?Biceps curl in supine - 3x20 3# ?Towel slide - 2x10 flexion and scaption ?Posterior capsule stretch  3x45" ? ?Manual Therapy (concentrating on increasing extensibility of restricted tissue to reduce discomfort and improve mechanics in functional movement): ?PROM R shoulder focusing on flexion ?Inferior and posterior capsule stretch G III ? ?Therapeutic Activity ?- collecting information for goals, checking progress, and reviewing with patient ? ?[facility] Adult PT Treatment:                                                DATE: 10/14/2021 ?Therapeutic Exercise: ? ?UBE - L 2 - 4' forward ?Chest press with 4# - 3x10 ?SA punch circles CC/CW - 2x20 ea ?UE ranger - 26'' -> 28'' flexion and abd - 20x ?Shoulder row - 3x10 RTB ?Shoulder ext  - 3x10 RTB ?Tricep push down - 3x10 - RTB ?IR - RTB - 3x10 ?AAROM in supine to about 100 degrees- 3x10 ?Sidelying ER AROM on towel roll 4x10 - 1# ?Biceps curl in supine - 3x20 3# ?Towel slide with lift off - 2x10 flexion  ?Posterior capsule stretch 3x45" ? ?Manual Therapy (concentrating on increasing extensibility of restricted tissue to reduce discomfort and improve mechanics in functional movement): ?PROM R shoulder focusing on flexion ? ? ?  ?  ?PATIENT EDUCATION: ?Education details: Pt educated on prognosis, POC, FOTO, HEP ?Person educated: Patient ?Education method: Explanation, Demonstration, and Handouts ?Education comprehension: verbalized understanding and returned demonstration ?  ?  ?HOME EXERCISE PROGRAM: ?Access Code: [email or web address] ?URL: [email or web address] ?Date: 10/07/2021 ?Prepared by: Shearon Balo ? ?Exercises ?Shoulder Scaption AAROM with [place] - 2 x daily - 7 x weekly - 3 sets - 10 reps - 5-sec hold ?Standing Shoulder Posterior Capsule Stretch - 2 x daily - 7 x weekly  - 3 sets - 30sec hold ?Sidelying Shoulder External Rotation - 1 x daily - 7 x weekly - 3 sets - 10 reps ? ?  ?  ?ASSESSMENT: ?  ?CLINICAL IMPRESSION: ?Patient presents to therapy with mild to moderate le

## 2021-10-21 ENCOUNTER — Ambulatory Visit: Payer: Medicare Other | Attending: Physician Assistant

## 2021-10-21 DIAGNOSIS — M25511 Pain in right shoulder: Secondary | ICD-10-CM | POA: Diagnosis not present

## 2021-10-21 DIAGNOSIS — M6281 Muscle weakness (generalized): Secondary | ICD-10-CM | POA: Insufficient documentation

## 2021-10-21 DIAGNOSIS — G8929 Other chronic pain: Secondary | ICD-10-CM | POA: Diagnosis not present

## 2021-10-22 NOTE — Therapy (Signed)
? ?OUTPATIENT PHYSICAL THERAPY TREATMENT NOTE ? ? ?Patient Name: Melissa Maldonado ?MRN: [id number] ?DOB:08-02-1940, 81 y.o., female ?Today's Date: 10/23/2021 ? ?PCP: [name], MD ?REFERRING PROVIDER: Aundra Dubin, PA-C ? ? PT End of Session - 10/23/21 1211   ? ? Visit Number 11   ? Number of Visits 17   ? Date for PT Re-Evaluation 11/12/21   ? Authorization Type MCR   ? Authorization Time Period FOTO v6, v10; KX Mod v15   ? Progress Note Due on Visit 10   ? PT Start Time 1215   ? PT Stop Time 1258   ? PT Time Calculation (min) 43 min   ? Activity Tolerance Patient tolerated treatment well;Patient limited by pain   ? Behavior During Therapy [facility] for tasks assessed/performed   ? ?  ?  ? ?  ? ? ? ? ? ?Past Medical History:  ?Diagnosis Date  ? Arthritis   ? Chronic back pain   ? lumbar stenosis  ? Early cataracts, bilateral   ? GERD (gastroesophageal reflux disease)   ? occasionally but no meds required  ? Headache(784.0)   ? occasionally  ? History of blood transfusion   ? History of colon polyps   ? Hyperlipidemia   ? takes Lipitor every other day  ? Hypertension   ? takes losartan daily  ? Neuropathy   ? Osteoarthritis of left knee 04/27/2014  ? Restless leg   ? Shortness of breath   ? with exertion  ? SOB (shortness of breath) 2013  ? CPET  ? Tear of medial meniscus of left knee 09/08/2013  ? ?Past Surgical History:  ?Procedure Laterality Date  ? APPENDECTOMY    ? BACK SURGERY  2010  ? lumb fusion  ? COLONOSCOPY    ? EYE SURGERY    ? both cataracts  ? KNEE ARTHROSCOPY WITH MEDIAL MENISECTOMY Left 09/08/2013  ? Procedure: LEFT KNEE ARTHROSCOPY WITH PARTIAL MEDIAL MENISCECTOMY;  Surgeon: [name], MD;  Location: [facility];  Service: Orthopedics;  Laterality: Left;  ? LUMBAR LAMINECTOMY/DECOMPRESSION MICRODISCECTOMY  05/13/2012  ? Procedure: LUMBAR LAMINECTOMY/DECOMPRESSION MICRODISCECTOMY 1 LEVEL;  Surgeon: [name], MD;  Location: [place] [facility];  Service: Neurosurgery;  Laterality:  Bilateral;  Bilateral Lumbar one -two Decompressive Laminectomy  ? PARTIAL KNEE ARTHROPLASTY Left 04/27/2014  ? Procedure: LEFT UNICOMPARTMENTAL KNEE;  Surgeon: [name], MD;  Location: [place];  Service: Orthopedics;  Laterality: Left;  ? UPPER GI ENDOSCOPY    ? ?Patient Active Problem List  ? Diagnosis Date Noted  ? Arthritis of right acromioclavicular joint 08/28/2021  ? Impingement syndrome of right shoulder 08/19/2021  ? Nontraumatic incomplete tear of right rotator cuff 08/19/2021  ? Paresthesia 12/05/2020  ? Low back pain with right-sided sciatica 12/05/2020  ? Trochanteric bursitis, right hip 04/24/2020  ? Essential hypertension 01/12/2020  ? Sinus bradycardia 01/12/2020  ? Dyspnea on exertion 01/12/2020  ? Lumbar stenosis with neurogenic claudication 02/01/2017  ? Osteoarthritis of left knee 04/27/2014  ? Tear of medial meniscus of left knee 09/08/2013  ? ? ?REFERRING DIAG: Z98.890 (ICD-10-CM) - S/P arthroscopy of right shoulder ? ?THERAPY DIAG:  ?Chronic right shoulder pain ? ?Muscle weakness (generalized) ? ?PERTINENT HISTORY: Rt shoulder debridement, SAD, and biceps tenotomy 08/28/2021 ?L1-L2 lumbar laminectomy/ decompression in 2013 ? ?PRECAUTIONS: Shoulder; Rt shoulder biceps tenotomy ([facility]) ? ?SUBJECTIVE: Pt rates her Rt shoulder pain as 4/10 and nagging. Pt reports doing her HEP 1  or 2 days per week. ? ? ?PAIN:  ?Are you having pain? Yes ?NPRS scale: 4/10 ?Pain location: Rt shoulder ?PAIN TYPE: aching and sharp ?Pain description: intermittent  ?Aggravating factors: lifting arm overhead and quick movements ?Relieving factors: pain medication, ice, and rest ? ? ? ? ?OBJECTIVE:  ? *Unless otherwise noted, objective information collected previously* ? ?DIAGNOSTIC FINDINGS:  ?None after surgery. ?  ?Prior to surgery: ?05/31/2021: MR Shoulder Rt without contrast: IMPRESSION: ?1. Diffuse severe supraspinatus tendinosis with full-thickness tear ?of the anterior  tendon proximal to the insertion. ?2. Severe distal infraspinatus tendinosis with small intrasubstance ?tear. ?3. Moderate distal subscapularis tendinosis with small ?intrasubstance tear. ?4. Severe biceps tendinosis with medial subluxation and small ?partial tear within the bicipital groove. ?5. Severe acromioclavicular and mild glenohumeral osteoarthritis. ?  ?PATIENT SURVEYS:  ?FOTO 53%, predicted 67% in 15 visits ?  ?COGNITION: ?         Overall cognitive status: Within functional limits for tasks assessed ?                              ?SENSATION: ?         Light touch: Appears intact ?  ?POSTURE: ?Forward head, BIL rounded shoulders ?  ?UPPER EXTREMITY AROM/PROM: ?  ?A/PROM Right ?09/17/2021 Left ?09/17/2021 Right ?09/23/2021 Right ?09/25/2021 Right ?09/30/21 R ?3/16 R ?3/23 R ?3/23  ?Shoulder flexion 80/110p! 180 115/120p! 110/125p! 130/135p! 130  130/140 no pain  ?Shoulder abduction 78/103p! 180 80/90p! 75/90p! 90/105p!     ?Shoulder internal rotation 50/65p! 90 85p! 86      ?Shoulder external rotation 50/60p! 80 60/65p! 50/55p! 60/66p!  Full no pain in normal range   ?Elbow flexion 130/130 130/130        ?Elbow extension 0/0 0/0        ?(Blank rows = not tested) ?  ?UPPER EXTREMITY MMT: ?  ?MMT Right ?09/17/2021 Left ?09/17/2021 Right ?10/23/2021  ?Shoulder flexion 3/5p! 4+/5 4/5p!  ?Shoulder abduction 3/5p! 4+/5 5/5  ?Shoulder internal rotation 4/5 5/5 5/5  ?Shoulder external rotation 3+/5p! 5/5 4/5p!  ?Middle trapezius       ?Latissimus Dorsi       ?Elbow flexion 4+/5 5/5   ?Elbow extension 5/5 5/5   ?Grip strength (lbs) 42 44   ?(Blank rows = not tested) ?  ?  ?JOINT MOBILITY TESTING:  ?Mild hypomobility with minor pain with GHJ joint mobilization AP/PA/Inf ?  ?PALPATION:  ?TTP to surgical incisions at Rt shoulder ?           ?TODAY'S TREATMENT:  ? ?[facility] Adult PT Treatment:                                                DATE: 10/23/2021 ?Therapeutic Exercise: ?Seated low rows with 25# cable 2x10 ?Seated high rows with  25# cable 2x10 ?Seated lat pull-down with PT assistance to prevent uncomfortable elevation ranged  ?Seated shoulder rolls 2x10 forward and backward ?Sidelying Rt shoulder ER with 3# dumbbell 3x10 ?Stepwise Rt shoulder scaption 3# dumbbell lift to shoulder-level shelf and then to overhead shelf and then back down again 3x10 ?Bent-over Rt shoulder pendulums 2x20 clockwise/ counter-clockwise ?Physioball roll up wall with PT perturbations at top 3x30sec ?Standing V's with back on wall and holding black physioball 2x10 BIL ?Manual Therapy: ?Supine  shoulder flexion PROM with light distraction and vibration x4 minutes ?Supine shoulder IR/ER PROM with light distraction and vibration x4 minutes ?Effleurage to posterior Rt shoulder musculature x4 minutes ?Neuromuscular re-ed: ?N/A ?Therapeutic Activity: ?N/A ?Modalities: ?N/A ?Self Care: ?N/A ? ? ?[place] Adult PT Treatment:                                                DATE: 10/21/2021 ?Therapeutic Exercise: ?UBE - L2 - 5' forward ?Pball roll up wall with lift off x10 ?SA punch circles CC/CW - x20 ea ?UE ranger - 28'' flexion and abd - 20x ?Shoulder row - 3x10 RTB ?Shoulder ext  - 3x10 RTB ?Tricep push down - 3x10 - RTB ?IR - RTB - 3x10 Rt ?ER - YTB - 3x10 Rt ?Biceps curl in supine - 3x20 3# ?Posterior capsule stretch 3x30" Rt ?Manual Therapy (concentrating on increasing extensibility of restricted tissue to reduce discomfort and improve mechanics in functional movement): ?PROM R shoulder focusing on flexion ? ? ?[facility] Adult PT Treatment:                                                DATE: 10/16/2021 ?Therapeutic Exercise: ? ?UBE - L 2 - 4' forward ?Chest press with 3# - 3x10 ?SA punch circles CC/CW - 2x20 ea ?UE ranger - 26'' -> 28'' flexion and abd - 20x ?Shoulder row - 3x10 RTB ?Shoulder ext  - 3x10 RTB ?Tricep push down - 3x10 - RTB ?IR - RTB - 3x10 ?ER - YTB - 3x10 ?AAROM in supine to about 120 degrees- 3x10 ?Biceps curl in supine - 3x20 3# ?Towel slide - 2x10 flexion and  scaption ?Posterior capsule stretch 3x45" ? ?Manual Therapy (concentrating on increasing extensibility of restricted tissue to reduce discomfort and improve mechanics in functional movement): ?PROM R shoulder focus

## 2021-10-23 ENCOUNTER — Ambulatory Visit: Payer: Medicare Other

## 2021-10-23 DIAGNOSIS — M6281 Muscle weakness (generalized): Secondary | ICD-10-CM

## 2021-10-23 DIAGNOSIS — G8929 Other chronic pain: Secondary | ICD-10-CM

## 2021-10-23 DIAGNOSIS — M25511 Pain in right shoulder: Secondary | ICD-10-CM | POA: Diagnosis not present

## 2021-10-28 ENCOUNTER — Encounter: Payer: Self-pay | Admitting: Physical Therapy

## 2021-10-28 ENCOUNTER — Ambulatory Visit: Payer: Medicare Other | Admitting: Physical Therapy

## 2021-10-28 DIAGNOSIS — M25511 Pain in right shoulder: Secondary | ICD-10-CM | POA: Diagnosis not present

## 2021-10-28 DIAGNOSIS — M6281 Muscle weakness (generalized): Secondary | ICD-10-CM

## 2021-10-28 DIAGNOSIS — G8929 Other chronic pain: Secondary | ICD-10-CM

## 2021-10-28 NOTE — Therapy (Signed)
? ?OUTPATIENT PHYSICAL THERAPY TREATMENT NOTE ? ? ?Patient Name: Melissa Maldonado ?MRN: [id number] ?DOB:[birth year], 81 y.o., female ?Today's Date: 10/28/2021 ? ?PCP: [name], MD ?REFERRING PROVIDER: Aundra Dubin, PA-C ? ? PT End of Session - 10/28/21 1353   ? ? Visit Number 12   ? Number of Visits 17   ? Date for PT Re-Evaluation 11/12/21   ? Authorization Type MCR   ? Authorization Time Period FOTO v6, v10; KX Mod v15   ? Progress Note Due on Visit 10   ? PT Start Time 4098   ? PT Stop Time 0233   ? PT Time Calculation (min) 40 min   ? Activity Tolerance Patient tolerated treatment well;Patient limited by pain   ? Behavior During Therapy [facility] for tasks assessed/performed   ? ?  ?  ? ?  ? ? ? ? ? ?Past Medical History:  ?Diagnosis Date  ? Arthritis   ? Chronic back pain   ? lumbar stenosis  ? Early cataracts, bilateral   ? GERD (gastroesophageal reflux disease)   ? occasionally but no meds required  ? Headache(784.0)   ? occasionally  ? History of blood transfusion   ? History of colon polyps   ? Hyperlipidemia   ? takes Lipitor every other day  ? Hypertension   ? takes losartan daily  ? Neuropathy   ? Osteoarthritis of left knee 04/27/2014  ? Restless leg   ? Shortness of breath   ? with exertion  ? SOB (shortness of breath) 2013  ? CPET  ? Tear of medial meniscus of left knee 09/08/2013  ? ?Past Surgical History:  ?Procedure Laterality Date  ? APPENDECTOMY    ? BACK SURGERY  2010  ? lumb fusion  ? COLONOSCOPY    ? EYE SURGERY    ? both cataracts  ? KNEE ARTHROSCOPY WITH MEDIAL MENISECTOMY Left 09/08/2013  ? Procedure: LEFT KNEE ARTHROSCOPY WITH PARTIAL MEDIAL MENISCECTOMY;  Surgeon: [name], MD;  Location: [facility];  Service: Orthopedics;  Laterality: Left;  ? LUMBAR LAMINECTOMY/DECOMPRESSION MICRODISCECTOMY  05/13/2012  ? Procedure: LUMBAR LAMINECTOMY/DECOMPRESSION MICRODISCECTOMY 1 LEVEL;  Surgeon: [name], MD;  Location: [place] [facility];  Service: Neurosurgery;  Laterality:  Bilateral;  Bilateral Lumbar one -two Decompressive Laminectomy  ? PARTIAL KNEE ARTHROPLASTY Left 04/27/2014  ? Procedure: LEFT UNICOMPARTMENTAL KNEE;  Surgeon: [name], MD;  Location: [place] [facility];  Service: Orthopedics;  Laterality: Left;  ? UPPER GI ENDOSCOPY    ? ?Patient Active Problem List  ? Diagnosis Date Noted  ? Arthritis of right acromioclavicular joint 08/28/2021  ? Impingement syndrome of right shoulder 08/19/2021  ? Nontraumatic incomplete tear of right rotator cuff 08/19/2021  ? Paresthesia 12/05/2020  ? Low back pain with right-sided sciatica 12/05/2020  ? Trochanteric bursitis, right hip 04/24/2020  ? Essential hypertension 01/12/2020  ? Sinus bradycardia 01/12/2020  ? Dyspnea on exertion 01/12/2020  ? Lumbar stenosis with neurogenic claudication 02/01/2017  ? Osteoarthritis of left knee 04/27/2014  ? Tear of medial meniscus of left knee 09/08/2013  ? ? ?REFERRING DIAG: Z98.890 (ICD-10-CM) - S/P arthroscopy of right shoulder ? ?THERAPY DIAG:  ?Chronic right shoulder pain ? ?Muscle weakness (generalized) ? ?PERTINENT HISTORY: Rt shoulder debridement, SAD, and biceps tenotomy 08/28/2021 ?L1-L2 lumbar laminectomy/ decompression in 2013 ? ?PRECAUTIONS: Shoulder; Rt shoulder biceps tenotomy ([facility]) ? ?SUBJECTIVE: Pt reports that her shoulder is doing well today with reduced baseline pain. ? ? ?PAIN:  ?  Are you having pain? Yes ?NPRS scale: 0/10 ?Pain location: Rt shoulder ?PAIN TYPE: aching and sharp ?Pain description: intermittent  ?Aggravating factors: lifting arm overhead and quick movements ?Relieving factors: pain medication, ice, and rest ? ? ? ? ?OBJECTIVE:  ? *Unless otherwise noted, objective information collected previously* ? ?DIAGNOSTIC FINDINGS:  ?None after surgery. ?  ?Prior to surgery: ?05/31/2021: MR Shoulder Rt without contrast: IMPRESSION: ?1. Diffuse severe supraspinatus tendinosis with full-thickness tear ?of the anterior tendon proximal to the  insertion. ?2. Severe distal infraspinatus tendinosis with small intrasubstance ?tear. ?3. Moderate distal subscapularis tendinosis with small ?intrasubstance tear. ?4. Severe biceps tendinosis with medial subluxation and small ?partial tear within the bicipital groove. ?5. Severe acromioclavicular and mild glenohumeral osteoarthritis. ?  ?PATIENT SURVEYS:  ?FOTO 53%, predicted 67% in 15 visits ?  ?COGNITION: ?         Overall cognitive status: Within functional limits for tasks assessed ?                              ?SENSATION: ?         Light touch: Appears intact ?  ?POSTURE: ?Forward head, BIL rounded shoulders ?  ?UPPER EXTREMITY AROM/PROM: ?  ?A/PROM Right ?09/17/2021 Left ?09/17/2021 Right ?09/23/2021 Right ?09/25/2021 Right ?09/30/21 R ?3/16 R ?3/23 R ?3/23  ?Shoulder flexion 80/110p! 180 115/120p! 110/125p! 130/135p! 130  130/140 no pain  ?Shoulder abduction 78/103p! 180 80/90p! 75/90p! 90/105p!     ?Shoulder internal rotation 50/65p! 90 85p! 86      ?Shoulder external rotation 50/60p! 80 60/65p! 50/55p! 60/66p!  Full no pain in normal range   ?Elbow flexion 130/130 130/130        ?Elbow extension 0/0 0/0        ?(Blank rows = not tested) ?  ?UPPER EXTREMITY MMT: ?  ?MMT Right ?09/17/2021 Left ?09/17/2021 Right ?10/23/2021  ?Shoulder flexion 3/5p! 4+/5 4/5p!  ?Shoulder abduction 3/5p! 4+/5 5/5  ?Shoulder internal rotation 4/5 5/5 5/5  ?Shoulder external rotation 3+/5p! 5/5 4/5p!  ?Middle trapezius       ?Latissimus Dorsi       ?Elbow flexion 4+/5 5/5   ?Elbow extension 5/5 5/5   ?Grip strength (lbs) 42 44   ?(Blank rows = not tested) ?  ?  ?JOINT MOBILITY TESTING:  ?Mild hypomobility with minor pain with GHJ joint mobilization AP/PA/Inf ?  ?PALPATION:  ?TTP to surgical incisions at Rt shoulder ?           ?TODAY'S TREATMENT:  ? ?[facility] Adult PT Treatment:                                                DATE: 10/28/2021 ?Therapeutic Exercise: ?UBE - L2 - 5' forward ?Pball roll up wall with lift off x10 ?Seated low rows with  25# cable 2x10 ?Seated high rows with 25# cable 2x10 ?Seated lat pull-down in comfortable elevation range - 3x10 - 25# ?IR - BTB - 3x10 Rt ?ER - RTB - 3x10 Rt ?Biceps curl 3x15 5# ?Farmers carry - 8# - 20' ?Stepwise Rt shoulder scaption 3# dumbbell lift to shoulder-level shelf and then to overhead shelf and then back down again 3x10 ? ?Manual Therapy (concentrating on increasing extensibility of restricted tissue to reduce discomfort and improve mechanics in functional movement): ?PROM R  shoulder focusing on flexion ?Inferior GH glide ? ?[facility] Adult PT Treatment:                                                DATE: 10/23/2021 ?Therapeutic Exercise: ?Seated low rows with 25# cable 2x10 ?Seated high rows with 25# cable 2x10 ?Seated lat pull-down with PT assistance to prevent uncomfortable elevation ranged  ?Seated shoulder rolls 2x10 forward and backward ?Sidelying Rt shoulder ER with 3# dumbbell 3x10 ?Stepwise Rt shoulder scaption 3# dumbbell lift to shoulder-level shelf and then to overhead shelf and then back down again 3x10 ?Bent-over Rt shoulder pendulums 2x20 clockwise/ counter-clockwise ?Physioball roll up wall with PT perturbations at top 3x30sec ?Standing V's with back on wall and holding black physioball 2x10 BIL ?Manual Therapy: ?Supine shoulder flexion PROM with light distraction and vibration x4 minutes ?Supine shoulder IR/ER PROM with light distraction and vibration x4 minutes ?Effleurage to posterior Rt shoulder musculature x4 minutes ?Neuromuscular re-ed: ?N/A ?Therapeutic Activity: ?N/A ?Modalities: ?N/A ?Self Care: ?N/A ? ? ?[place] Adult PT Treatment:                                                DATE: 10/21/2021 ?Therapeutic Exercise: ?UBE - L2 - 5' forward ?Pball roll up wall with lift off x10 ?SA punch circles CC/CW - x20 ea ?UE ranger - 28'' flexion and abd - 20x ?Shoulder row - 3x10 RTB ?Shoulder ext  - 3x10 RTB ?Tricep push down - 3x10 - RTB ?IR - RTB - 3x10 Rt ?ER - YTB - 3x10 Rt ?Biceps curl in  supine - 3x20 3# ?Posterior capsule stretch 3x30" Rt ?Manual Therapy (concentrating on increasing extensibility of restricted tissue to reduce discomfort and improve mechanics in functional movement): ?PROM R s

## 2021-10-30 ENCOUNTER — Encounter: Payer: Self-pay | Admitting: Physical Therapy

## 2021-10-30 ENCOUNTER — Ambulatory Visit: Payer: Medicare Other | Admitting: Physical Therapy

## 2021-10-30 DIAGNOSIS — M6281 Muscle weakness (generalized): Secondary | ICD-10-CM | POA: Diagnosis not present

## 2021-10-30 DIAGNOSIS — G8929 Other chronic pain: Secondary | ICD-10-CM

## 2021-10-30 DIAGNOSIS — H40011 Open angle with borderline findings, low risk, right eye: Secondary | ICD-10-CM | POA: Diagnosis not present

## 2021-10-30 DIAGNOSIS — M25511 Pain in right shoulder: Secondary | ICD-10-CM | POA: Diagnosis not present

## 2021-10-30 DIAGNOSIS — H401221 Low-tension glaucoma, left eye, mild stage: Secondary | ICD-10-CM | POA: Diagnosis not present

## 2021-10-30 NOTE — Therapy (Signed)
? ?OUTPATIENT PHYSICAL THERAPY TREATMENT NOTE ? ? ?Patient Name: ONESHA KREBBS ?MRN: [id number] ?DOB:[birth year], 81 y.o., female ?Today's Date: 10/30/2021 ? ?PCP: [name], MD ?REFERRING PROVIDER: Aundra Dubin, PA-C ? ? PT End of Session - 10/30/21 1529   ? ? Visit Number 13   ? Number of Visits 17   ? Date for PT Re-Evaluation 11/12/21   ? Authorization Type MCR   ? Authorization Time Period FOTO v6, v10; KX Mod v15   ? Progress Note Due on Visit 10   ? PT Start Time 1530   ? PT Stop Time 1612   ? PT Time Calculation (min) 42 min   ? Activity Tolerance Patient tolerated treatment well;Patient limited by pain   ? Behavior During Therapy [facility] for tasks assessed/performed   ? ?  ?  ? ?  ? ? ? ? ? ?Past Medical History:  ?Diagnosis Date  ? Arthritis   ? Chronic back pain   ? lumbar stenosis  ? Early cataracts, bilateral   ? GERD (gastroesophageal reflux disease)   ? occasionally but no meds required  ? Headache(784.0)   ? occasionally  ? History of blood transfusion   ? History of colon polyps   ? Hyperlipidemia   ? takes Lipitor every other day  ? Hypertension   ? takes losartan daily  ? Neuropathy   ? Osteoarthritis of left knee 04/27/2014  ? Restless leg   ? Shortness of breath   ? with exertion  ? SOB (shortness of breath) 2013  ? CPET  ? Tear of medial meniscus of left knee 09/08/2013  ? ?Past Surgical History:  ?Procedure Laterality Date  ? APPENDECTOMY    ? BACK SURGERY  2010  ? lumb fusion  ? COLONOSCOPY    ? EYE SURGERY    ? both cataracts  ? KNEE ARTHROSCOPY WITH MEDIAL MENISECTOMY Left 09/08/2013  ? Procedure: LEFT KNEE ARTHROSCOPY WITH PARTIAL MEDIAL MENISCECTOMY;  Surgeon: [name], MD;  Location: [place];  Service: Orthopedics;  Laterality: Left;  ? LUMBAR LAMINECTOMY/DECOMPRESSION MICRODISCECTOMY  05/13/2012  ? Procedure: LUMBAR LAMINECTOMY/DECOMPRESSION MICRODISCECTOMY 1 LEVEL;  Surgeon: [name], MD;  Location: [place] [facility];  Service: Neurosurgery;  Laterality:  Bilateral;  Bilateral Lumbar one -two Decompressive Laminectomy  ? PARTIAL KNEE ARTHROPLASTY Left 04/27/2014  ? Procedure: LEFT UNICOMPARTMENTAL KNEE;  Surgeon: [name], MD;  Location: [place];  Service: Orthopedics;  Laterality: Left;  ? UPPER GI ENDOSCOPY    ? ?Patient Active Problem List  ? Diagnosis Date Noted  ? Arthritis of right acromioclavicular joint 08/28/2021  ? Impingement syndrome of right shoulder 08/19/2021  ? Nontraumatic incomplete tear of right rotator cuff 08/19/2021  ? Paresthesia 12/05/2020  ? Low back pain with right-sided sciatica 12/05/2020  ? Trochanteric bursitis, right hip 04/24/2020  ? Essential hypertension 01/12/2020  ? Sinus bradycardia 01/12/2020  ? Dyspnea on exertion 01/12/2020  ? Lumbar stenosis with neurogenic claudication 02/01/2017  ? Osteoarthritis of left knee 04/27/2014  ? Tear of medial meniscus of left knee 09/08/2013  ? ? ?REFERRING DIAG: Z98.890 (ICD-10-CM) - S/P arthroscopy of right shoulder ? ?THERAPY DIAG:  ?Chronic right shoulder pain ? ?Muscle weakness (generalized) ? ?PERTINENT HISTORY: Rt shoulder debridement, SAD, and biceps tenotomy 08/28/2021 ?L1-L2 lumbar laminectomy/ decompression in 2013 ? ?PRECAUTIONS: Shoulder; Rt shoulder biceps tenotomy ([facility]) ? ?SUBJECTIVE: Pt reports that she is doing well with minimal pain. ? ? ?PAIN:  ?Are you  having pain? Yes ?NPRS scale: 0/10 ?Pain location: Rt shoulder ?PAIN TYPE: aching and sharp ?Pain description: intermittent  ?Aggravating factors: lifting arm overhead and quick movements ?Relieving factors: pain medication, ice, and rest ? ? ? ? ?OBJECTIVE:  ? *Unless otherwise noted, objective information collected previously* ? ?DIAGNOSTIC FINDINGS:  ?None after surgery. ?  ?Prior to surgery: ?05/31/2021: MR Shoulder Rt without contrast: IMPRESSION: ?1. Diffuse severe supraspinatus tendinosis with full-thickness tear ?of the anterior tendon proximal to the insertion. ?2. Severe  distal infraspinatus tendinosis with small intrasubstance ?tear. ?3. Moderate distal subscapularis tendinosis with small ?intrasubstance tear. ?4. Severe biceps tendinosis with medial subluxation and small ?partial tear within the bicipital groove. ?5. Severe acromioclavicular and mild glenohumeral osteoarthritis. ?  ?PATIENT SURVEYS:  ?FOTO 53%, predicted 67% in 15 visits ?  ?COGNITION: ?         Overall cognitive status: Within functional limits for tasks assessed ?                              ?SENSATION: ?         Light touch: Appears intact ?  ?POSTURE: ?Forward head, BIL rounded shoulders ?  ?UPPER EXTREMITY AROM/PROM: ?  ?A/PROM Right ?09/17/2021 Left ?09/17/2021 Right ?09/23/2021 Right ?09/25/2021 Right ?09/30/21 R ?3/16 R ?3/23 R ?3/23  ?Shoulder flexion 80/110p! 180 115/120p! 110/125p! 130/135p! 130  130/140 no pain  ?Shoulder abduction 78/103p! 180 80/90p! 75/90p! 90/105p!     ?Shoulder internal rotation 50/65p! 90 85p! 86      ?Shoulder external rotation 50/60p! 80 60/65p! 50/55p! 60/66p!  Full no pain in normal range   ?Elbow flexion 130/130 130/130        ?Elbow extension 0/0 0/0        ?(Blank rows = not tested) ?  ?UPPER EXTREMITY MMT: ?  ?MMT Right ?09/17/2021 Left ?09/17/2021 Right ?10/23/2021  ?Shoulder flexion 3/5p! 4+/5 4/5p!  ?Shoulder abduction 3/5p! 4+/5 5/5  ?Shoulder internal rotation 4/5 5/5 5/5  ?Shoulder external rotation 3+/5p! 5/5 4/5p!  ?Middle trapezius       ?Latissimus Dorsi       ?Elbow flexion 4+/5 5/5   ?Elbow extension 5/5 5/5   ?Grip strength (lbs) 42 44   ?(Blank rows = not tested) ?  ?  ?JOINT MOBILITY TESTING:  ?Mild hypomobility with minor pain with GHJ joint mobilization AP/PA/Inf ?  ?PALPATION:  ?TTP to surgical incisions at Rt shoulder ?           ?TODAY'S TREATMENT:  ? ?[place] Adult PT Treatment:                                                DATE: 10/30/2021 ?Therapeutic Exercise: ?UBE - L3 - 5' forward ?Seated low rows with 30# cable 2x10 ?Seated high rows with 30# cable 2x10 ?Seated  lat pull-down in comfortable elevation range - 3x10 - 30# ?OH flexion press - 0# - 3x10 ?Wall push up - 3x10 ?Flexion slides on wall - 2x10 ?IR - BTB - 3x10 Rt ?ER - RTB - 3x10 Rt ?Biceps curl 3x15 5# ?Farmers carry - 10# - 74' ?Stepwise Rt shoulder scaption 3# dumbbell lift to shoulder-level shelf and then to overhead shelf and then back down again 2x10 ? ?Manual Therapy (concentrating on increasing extensibility of restricted tissue to reduce discomfort  and improve mechanics in functional movement): ?PROM R shoulder focusing on flexion ? ? ?[facility] Adult PT Treatment:                                                DATE: 10/28/2021 ?Therapeutic Exercise: ?UBE - L2 - 5' forward ?Pball roll up wall with lift off x10 ?Seated low rows with 25# cable 2x10 ?Seated high rows with 25# cable 2x10 ?Seated lat pull-down in comfortable elevation range - 3x10 - 25# ?IR - BTB - 3x10 Rt ?ER - RTB - 3x10 Rt ?Biceps curl 3x15 5# ?Farmers carry - 8# - 39' ?Stepwise Rt shoulder scaption 3# dumbbell lift to shoulder-level shelf and then to overhead shelf and then back down again 3x10 ? ?Manual Therapy (concentrating on increasing extensibility of restricted tissue to reduce discomfort and improve mechanics in functional movement): ?PROM R shoulder focusing on flexion ?Inferior GH glide ? ?[facility] Adult PT Treatment:                                                DATE: 10/23/2021 ?Therapeutic Exercise: ?Seated low rows with 25# cable 2x10 ?Seated high rows with 25# cable 2x10 ?Seated lat pull-down with PT assistance to prevent uncomfortable elevation ranged  ?Seated shoulder rolls 2x10 forward and backward ?Sidelying Rt shoulder ER with 3# dumbbell 3x10 ?Stepwise Rt shoulder scaption 3# dumbbell lift to shoulder-level shelf and then to overhead shelf and then back down again 3x10 ?Bent-over Rt shoulder pendulums 2x20 clockwise/ counter-clockwise ?Physioball roll up wall with PT perturbations at top 3x30sec ?Standing V's with back on wall and  holding black physioball 2x10 BIL ?Manual Therapy: ?Supine shoulder flexion PROM with light distraction and vibration x4 minutes ?Supine shoulder IR/ER PROM with light distraction and vibration x4 minutes ?Eff

## 2021-11-04 ENCOUNTER — Encounter: Payer: Self-pay | Admitting: Physical Therapy

## 2021-11-04 ENCOUNTER — Ambulatory Visit: Payer: Medicare Other | Admitting: Physical Therapy

## 2021-11-04 DIAGNOSIS — G8929 Other chronic pain: Secondary | ICD-10-CM | POA: Diagnosis not present

## 2021-11-04 DIAGNOSIS — M6281 Muscle weakness (generalized): Secondary | ICD-10-CM

## 2021-11-04 DIAGNOSIS — M25511 Pain in right shoulder: Secondary | ICD-10-CM | POA: Diagnosis not present

## 2021-11-04 NOTE — Therapy (Signed)
? ?OUTPATIENT PHYSICAL THERAPY TREATMENT NOTE ? ? ?Patient Name: Melissa Maldonado ?MRN: [id number] ?DOB:1941-06-05, 81 y.o., female ?Today's Date: 11/04/2021 ? ?PCP: [name], MD ?REFERRING PROVIDER: Aundra Dubin, PA-C ? ? PT End of Session - 11/04/21 1526   ? ? Visit Number 14   ? Number of Visits 17   ? Date for PT Re-Evaluation 11/12/21   ? Authorization Type MCR   ? Authorization Time Period FOTO v6, v10; KX Mod v15   ? Progress Note Due on Visit 10   ? PT Start Time 1530   ? PT Stop Time 1612   ? PT Time Calculation (min) 42 min   ? Activity Tolerance Patient tolerated treatment well;Patient limited by pain   ? Behavior During Therapy [facility] for tasks assessed/performed   ? ?  ?  ? ?  ? ? ? ? ? ?Past Medical History:  ?Diagnosis Date  ? Arthritis   ? Chronic back pain   ? lumbar stenosis  ? Early cataracts, bilateral   ? GERD (gastroesophageal reflux disease)   ? occasionally but no meds required  ? Headache(784.0)   ? occasionally  ? History of blood transfusion   ? History of colon polyps   ? Hyperlipidemia   ? takes Lipitor every other day  ? Hypertension   ? takes losartan daily  ? Neuropathy   ? Osteoarthritis of left knee 04/27/2014  ? Restless leg   ? Shortness of breath   ? with exertion  ? SOB (shortness of breath) 2013  ? CPET  ? Tear of medial meniscus of left knee 09/08/2013  ? ?Past Surgical History:  ?Procedure Laterality Date  ? APPENDECTOMY    ? BACK SURGERY  2010  ? lumb fusion  ? COLONOSCOPY    ? EYE SURGERY    ? both cataracts  ? KNEE ARTHROSCOPY WITH MEDIAL MENISECTOMY Left 09/08/2013  ? Procedure: LEFT KNEE ARTHROSCOPY WITH PARTIAL MEDIAL MENISCECTOMY;  Surgeon: [name], MD;  Location: [place];  Service: Orthopedics;  Laterality: Left;  ? LUMBAR LAMINECTOMY/DECOMPRESSION MICRODISCECTOMY  05/13/2012  ? Procedure: LUMBAR LAMINECTOMY/DECOMPRESSION MICRODISCECTOMY 1 LEVEL;  Surgeon: [name], MD;  Location: [place] [facility];  Service: Neurosurgery;  Laterality:  Bilateral;  Bilateral Lumbar one -two Decompressive Laminectomy  ? PARTIAL KNEE ARTHROPLASTY Left 04/27/2014  ? Procedure: LEFT UNICOMPARTMENTAL KNEE;  Surgeon: [name], MD;  Location: [place];  Service: Orthopedics;  Laterality: Left;  ? UPPER GI ENDOSCOPY    ? ?Patient Active Problem List  ? Diagnosis Date Noted  ? Arthritis of right acromioclavicular joint 08/28/2021  ? Impingement syndrome of right shoulder 08/19/2021  ? Nontraumatic incomplete tear of right rotator cuff 08/19/2021  ? Paresthesia 12/05/2020  ? Low back pain with right-sided sciatica 12/05/2020  ? Trochanteric bursitis, right hip 04/24/2020  ? Essential hypertension 01/12/2020  ? Sinus bradycardia 01/12/2020  ? Dyspnea on exertion 01/12/2020  ? Lumbar stenosis with neurogenic claudication 02/01/2017  ? Osteoarthritis of left knee 04/27/2014  ? Tear of medial meniscus of left knee 09/08/2013  ? ? ?REFERRING DIAG: Z98.890 (ICD-10-CM) - S/P arthroscopy of right shoulder ? ?THERAPY DIAG:  ?Chronic right shoulder pain ? ?Muscle weakness (generalized) ? ?PERTINENT HISTORY: Rt shoulder debridement, SAD, and biceps tenotomy 08/28/2021 ?L1-L2 lumbar laminectomy/ decompression in 2013 ? ?PRECAUTIONS: Shoulder; Rt shoulder biceps tenotomy ([facility]) ? ?SUBJECTIVE:  ? ?Pt reports that she is doing well overall.  She is sleepy and has  a bit of a "nagging" pain in her R shoulder, but can't put a number on the pain. ? ? ?PAIN:  ?Are you having pain? Yes ?NPRS scale: 0/10 ?Pain location: Rt shoulder ?PAIN TYPE: aching and sharp ?Pain description: intermittent  ?Aggravating factors: lifting arm overhead and quick movements ?Relieving factors: pain medication, ice, and rest ? ? ? ? ?OBJECTIVE:  ? *Unless otherwise noted, objective information collected previously* ? ?DIAGNOSTIC FINDINGS:  ?None after surgery. ?  ?Prior to surgery: ?05/31/2021: MR Shoulder Rt without contrast: IMPRESSION: ?1. Diffuse severe supraspinatus  tendinosis with full-thickness tear ?of the anterior tendon proximal to the insertion. ?2. Severe distal infraspinatus tendinosis with small intrasubstance ?tear. ?3. Moderate distal subscapularis tendinosis with small ?intrasubstance tear. ?4. Severe biceps tendinosis with medial subluxation and small ?partial tear within the bicipital groove. ?5. Severe acromioclavicular and mild glenohumeral osteoarthritis. ?  ?PATIENT SURVEYS:  ?FOTO 53%, predicted 67% in 15 visits ?  ?COGNITION: ?         Overall cognitive status: Within functional limits for tasks assessed ?                              ?SENSATION: ?         Light touch: Appears intact ?  ?POSTURE: ?Forward head, BIL rounded shoulders ?  ?UPPER EXTREMITY AROM/PROM: ?  ?A/PROM Right ?09/17/2021 Left ?09/17/2021 Right ?09/23/2021 Right ?09/25/2021 Right ?09/30/21 R ?3/16 R ?3/23 R ?3/23  ?Shoulder flexion 80/110p! 180 115/120p! 110/125p! 130/135p! 130  130/140 no pain  ?Shoulder abduction 78/103p! 180 80/90p! 75/90p! 90/105p!     ?Shoulder internal rotation 50/65p! 90 85p! 86      ?Shoulder external rotation 50/60p! 80 60/65p! 50/55p! 60/66p!  Full no pain in normal range   ?Elbow flexion 130/130 130/130        ?Elbow extension 0/0 0/0        ?(Blank rows = not tested) ?  ?UPPER EXTREMITY MMT: ?  ?MMT Right ?09/17/2021 Left ?09/17/2021 Right ?10/23/2021 R ?4/18  ?Shoulder flexion 3/5p! 4+/5 4/5p! 4+  ?Shoulder abduction 3/5p! 4+/5 5/5   ?Shoulder internal rotation 4/5 5/5 5/5   ?Shoulder external rotation 3+/5p! 5/5 4/5p! 4 p!  ?Middle trapezius        ?Latissimus Dorsi        ?Elbow flexion 4+/5 5/5    ?Elbow extension 5/5 5/5    ?Grip strength (lbs) 42 44    ?(Blank rows = not tested) ?  ?  ?JOINT MOBILITY TESTING:  ?Mild hypomobility with minor pain with GHJ joint mobilization AP/PA/Inf ?  ?PALPATION:  ?TTP to surgical incisions at Rt shoulder ?           ?TODAY'S TREATMENT:  ? ?[place] Adult PT Treatment:                                                DATE:  11/04/2021 ?Therapeutic Exercise: ?UBE - L3 - 5' forward ?Seated low rows with 30# cable 2x10 ?Seated high rows with 30# cable 2x10 ?Seated lat pull-down in comfortable elevation range - 3x10 - 30# ?OH flexion press - 1# - 2x10 ?Lateral raise - 2x10 ?Wall push up - 3x10 ?Flexion slides on wall - 2x10 ?IR - BTB - 3x10 Rt ?ER - RTB - 3x10 Rt ?Biceps curl 3x10 6# ?  Farmers carry - 10# - 16' ?Stepwise Rt shoulder scaption 3# dumbbell lift to shoulder-level shelf and then to overhead shelf and then back down again 2x10 ? ?Manual Therapy (concentrating on increasing extensibility of restricted tissue to reduce discomfort and improve mechanics in functional movement): ?PROM R shoulder focusing on flexion ? ?[facility] Adult PT Treatment:                                                DATE: 10/30/2021 ?Therapeutic Exercise: ?UBE - L3 - 5' forward ?Seated low rows with 30# cable 2x10 ?Seated high rows with 30# cable 2x10 ?Seated lat pull-down in comfortable elevation range - 3x10 - 30# ?OH flexion press - 0# - 3x10 ?Wall push up - 3x10 ?Flexion slides on wall - 2x10 ?IR - BTB - 3x10 Rt ?ER - RTB - 3x10 Rt ?Biceps curl 3x15 5# ?Farmers carry - 10# - 19' ?Stepwise Rt shoulder scaption 3# dumbbell lift to shoulder-level shelf and then to overhead shelf and then back down again 2x10 ? ?Manual Therapy (concentrating on increasing extensibility of restricted tissue to reduce discomfort and improve mechanics in functional movement): ?PROM R shoulder focusing on flexion ? ? ?[facility] Adult PT Treatment:                                                DATE: 10/28/2021 ?Therapeutic Exercise: ?UBE - L2 - 5' forward ?Pball roll up wall with lift off x10 ?Seated low rows with 25# cable 2x10 ?Seated high rows with 25# cable 2x10 ?Seated lat pull-down in comfortable elevation range - 3x10 - 25# ?IR - BTB - 3x10 Rt ?ER - RTB - 3x10 Rt ?Biceps curl 3x15 5# ?Farmers carry - 8# - 30' ?Stepwise Rt shoulder scaption 3# dumbbell lift to shoulder-level shelf  and then to overhead shelf and then back down again 3x10 ? ?Manual Therapy (concentrating on increasing extensibility of restricted tissue to reduce discomfort and improve mechanics in functional movement): ?PROM R shoulder foc

## 2021-11-06 ENCOUNTER — Ambulatory Visit: Payer: Medicare Other | Admitting: Physical Therapy

## 2021-11-06 ENCOUNTER — Encounter: Payer: Self-pay | Admitting: Physical Therapy

## 2021-11-06 DIAGNOSIS — M6281 Muscle weakness (generalized): Secondary | ICD-10-CM | POA: Diagnosis not present

## 2021-11-06 DIAGNOSIS — G8929 Other chronic pain: Secondary | ICD-10-CM

## 2021-11-06 DIAGNOSIS — M25511 Pain in right shoulder: Secondary | ICD-10-CM | POA: Diagnosis not present

## 2021-11-06 NOTE — Therapy (Signed)
? ?OUTPATIENT PHYSICAL THERAPY TREATMENT NOTE ? ? ?Patient Name: Melissa Maldonado ?MRN: [id number] ?DOB:1940-09-09, 81 y.o., female ?Today's Date: 11/06/2021 ? ?PCP: [name], MD ?REFERRING PROVIDER: Aundra Dubin, PA-C ? ? PT End of Session - 11/06/21 1532   ? ? Visit Number 15   ? Number of Visits 17   ? Date for PT Re-Evaluation 11/12/21   ? Authorization Type MCR   ? Authorization Time Period FOTO v6, v10; KX Mod v15   ? Progress Note Due on Visit 10   ? PT Start Time 1530   ? PT Stop Time 1610   ? PT Time Calculation (min) 40 min   ? Activity Tolerance Patient tolerated treatment well;Patient limited by pain   ? Behavior During Therapy [facility] for tasks assessed/performed   ? ?  ?  ? ?  ? ? ? ? ? ?Past Medical History:  ?Diagnosis Date  ? Arthritis   ? Chronic back pain   ? lumbar stenosis  ? Early cataracts, bilateral   ? GERD (gastroesophageal reflux disease)   ? occasionally but no meds required  ? Headache(784.0)   ? occasionally  ? History of blood transfusion   ? History of colon polyps   ? Hyperlipidemia   ? takes Lipitor every other day  ? Hypertension   ? takes losartan daily  ? Neuropathy   ? Osteoarthritis of left knee 04/27/2014  ? Restless leg   ? Shortness of breath   ? with exertion  ? SOB (shortness of breath) 2013  ? CPET  ? Tear of medial meniscus of left knee 09/08/2013  ? ?Past Surgical History:  ?Procedure Laterality Date  ? APPENDECTOMY    ? BACK SURGERY  2010  ? lumb fusion  ? COLONOSCOPY    ? EYE SURGERY    ? both cataracts  ? KNEE ARTHROSCOPY WITH MEDIAL MENISECTOMY Left 09/08/2013  ? Procedure: LEFT KNEE ARTHROSCOPY WITH PARTIAL MEDIAL MENISCECTOMY;  Surgeon: [name], MD;  Location: [place];  Service: Orthopedics;  Laterality: Left;  ? LUMBAR LAMINECTOMY/DECOMPRESSION MICRODISCECTOMY  05/13/2012  ? Procedure: LUMBAR LAMINECTOMY/DECOMPRESSION MICRODISCECTOMY 1 LEVEL;  Surgeon: [name], MD;  Location: [place] [facility];  Service: Neurosurgery;  Laterality:  Bilateral;  Bilateral Lumbar one -two Decompressive Laminectomy  ? PARTIAL KNEE ARTHROPLASTY Left 04/27/2014  ? Procedure: LEFT UNICOMPARTMENTAL KNEE;  Surgeon: [name], MD;  Location: [place];  Service: Orthopedics;  Laterality: Left;  ? UPPER GI ENDOSCOPY    ? ?Patient Active Problem List  ? Diagnosis Date Noted  ? Arthritis of right acromioclavicular joint 08/28/2021  ? Impingement syndrome of right shoulder 08/19/2021  ? Nontraumatic incomplete tear of right rotator cuff 08/19/2021  ? Paresthesia 12/05/2020  ? Low back pain with right-sided sciatica 12/05/2020  ? Trochanteric bursitis, right hip 04/24/2020  ? Essential hypertension 01/12/2020  ? Sinus bradycardia 01/12/2020  ? Dyspnea on exertion 01/12/2020  ? Lumbar stenosis with neurogenic claudication 02/01/2017  ? Osteoarthritis of left knee 04/27/2014  ? Tear of medial meniscus of left knee 09/08/2013  ? ? ?REFERRING DIAG: Z98.890 (ICD-10-CM) - S/P arthroscopy of right shoulder ? ?THERAPY DIAG:  ?Chronic right shoulder pain ? ?Muscle weakness (generalized) ? ?PERTINENT HISTORY: Rt shoulder debridement, SAD, and biceps tenotomy 08/28/2021 ?L1-L2 lumbar laminectomy/ decompression in 2013 ? ?PRECAUTIONS: Shoulder; Rt shoulder biceps tenotomy ([facility]) ? ?SUBJECTIVE:  ? ?Pt reports that she slept on her neck wrong last night and has some  pain there.  She reports she is not having what she would call pain in her R shoulder. ? ? ?PAIN:  ?Are you having pain? Yes ?NPRS scale: 0/10 ?Pain location: Rt shoulder ?PAIN TYPE: aching and sharp ?Pain description: intermittent  ?Aggravating factors: lifting arm overhead and quick movements ?Relieving factors: pain medication, ice, and rest ? ? ? ? ?OBJECTIVE:  ? *Unless otherwise noted, objective information collected previously* ? ?DIAGNOSTIC FINDINGS:  ?None after surgery. ?  ?Prior to surgery: ?05/31/2021: MR Shoulder Rt without contrast: IMPRESSION: ?1. Diffuse severe  supraspinatus tendinosis with full-thickness tear ?of the anterior tendon proximal to the insertion. ?2. Severe distal infraspinatus tendinosis with small intrasubstance ?tear. ?3. Moderate distal subscapularis tendinosis with small ?intrasubstance tear. ?4. Severe biceps tendinosis with medial subluxation and small ?partial tear within the bicipital groove. ?5. Severe acromioclavicular and mild glenohumeral osteoarthritis. ?  ?PATIENT SURVEYS:  ?FOTO 53%, predicted 67% in 15 visits ?  ?COGNITION: ?         Overall cognitive status: Within functional limits for tasks assessed ?                              ?SENSATION: ?         Light touch: Appears intact ?  ?POSTURE: ?Forward head, BIL rounded shoulders ?  ?UPPER EXTREMITY AROM/PROM: ?  ?A/PROM Right ?09/17/2021 Left ?09/17/2021 Right ?09/23/2021 Right ?09/25/2021 Right ?09/30/21 R ?3/16 R ?3/23 R ?3/23  ?Shoulder flexion 80/110p! 180 115/120p! 110/125p! 130/135p! 130  130/140 no pain  ?Shoulder abduction 78/103p! 180 80/90p! 75/90p! 90/105p!     ?Shoulder internal rotation 50/65p! 90 85p! 86      ?Shoulder external rotation 50/60p! 80 60/65p! 50/55p! 60/66p!  Full no pain in normal range   ?Elbow flexion 130/130 130/130        ?Elbow extension 0/0 0/0        ?(Blank rows = not tested) ?  ?UPPER EXTREMITY MMT: ?  ?MMT Right ?09/17/2021 Left ?09/17/2021 Right ?10/23/2021 R ?4/18  ?Shoulder flexion 3/5p! 4+/5 4/5p! 4+  ?Shoulder abduction 3/5p! 4+/5 5/5   ?Shoulder internal rotation 4/5 5/5 5/5   ?Shoulder external rotation 3+/5p! 5/5 4/5p! 4 p!  ?Middle trapezius        ?Latissimus Dorsi        ?Elbow flexion 4+/5 5/5    ?Elbow extension 5/5 5/5    ?Grip strength (lbs) 42 44    ?(Blank rows = not tested) ?  ?  ?JOINT MOBILITY TESTING:  ?Mild hypomobility with minor pain with GHJ joint mobilization AP/PA/Inf ?  ?PALPATION:  ?TTP to surgical incisions at Rt shoulder ?           ?TODAY'S TREATMENT:  ? ?[place] Adult PT Treatment:                                                DATE:  11/06/2021 ?Therapeutic Exercise: ?UBE - L3 - 5' forward ?Seated low rows with 30# cable 2x10 ?Seated high rows with 30# cable 2x10 ?Seated lat pull-down in comfortable elevation range - 3x10 - 30# ?OH flexion press - 1# - 2x10 ?Lateral raise - 2x10 ?Wall push up - 3x10 ?Flexion slides on wall - 2x10 ?IR - Black TB - 3x10 Rt ?ER - RTB - 3x10 Rt ?Biceps curl 3x10  6# ?Stepwise Rt shoulder scaption 4# dumbbell lift to shoulder-level shelf and then to overhead shelf and then back down again 2x5 ? ?Manual Therapy (concentrating on increasing extensibility of restricted tissue to reduce discomfort and improve mechanics in functional movement): ?STM L UT ?Lateral glide lower Cx spine G III ? ?[facility] Adult PT Treatment:                                                DATE: 11/04/2021 ?Therapeutic Exercise: ?UBE - L3 - 5' forward ?Seated low rows with 30# cable 2x10 ?Seated high rows with 30# cable 2x10 ?Seated lat pull-down in comfortable elevation range - 3x10 - 30# ?OH flexion press - 1# - 2x10 ?Lateral raise - 2x10 ?Wall push up - 3x10 ?Flexion slides on wall - 2x10 ?IR - BTB - 3x10 Rt ?ER - RTB - 3x10 Rt ?Biceps curl 3x10 6# ?Farmers carry - 10# - 75' ?Stepwise Rt shoulder scaption 3# dumbbell lift to shoulder-level shelf and then to overhead shelf and then back down again 2x10 ? ?Manual Therapy (concentrating on increasing extensibility of restricted tissue to reduce discomfort and improve mechanics in functional movement): ?PROM R shoulder focusing on flexion ? ?[facility] Adult PT Treatment:                                                DATE: 10/30/2021 ?Therapeutic Exercise: ?UBE - L3 - 5' forward ?Seated low rows with 30# cable 2x10 ?Seated high rows with 30# cable 2x10 ?Seated lat pull-down in comfortable elevation range - 3x10 - 30# ?OH flexion press - 0# - 3x10 ?Wall push up - 3x10 ?Flexion slides on wall - 2x10 ?IR - BTB - 3x10 Rt ?ER - RTB - 3x10 Rt ?Biceps curl 3x15 5# ?Farmers carry - 10# - 82' ?Stepwise Rt shoulder  scaption 3# dumbbell lift to shoulder-level shelf and then to overhead shelf and then back down again 2x10 ? ?Manual Therapy (concentrating on increasing extensibility of restricted tissue to reduce discomfort and impro

## 2021-11-11 ENCOUNTER — Encounter: Payer: Self-pay | Admitting: Physical Therapy

## 2021-11-11 ENCOUNTER — Ambulatory Visit: Payer: Medicare Other | Admitting: Physical Therapy

## 2021-11-11 DIAGNOSIS — G8929 Other chronic pain: Secondary | ICD-10-CM | POA: Diagnosis not present

## 2021-11-11 DIAGNOSIS — M6281 Muscle weakness (generalized): Secondary | ICD-10-CM

## 2021-11-11 DIAGNOSIS — M25511 Pain in right shoulder: Secondary | ICD-10-CM | POA: Diagnosis not present

## 2021-11-11 NOTE — Therapy (Signed)
? ?OUTPATIENT PHYSICAL THERAPY TREATMENT NOTE ? ? ?Patient Name: Melissa Maldonado ?MRN: [id number] ?DOB:10-Apr-1941, 81 y.o., female ?Today's Date: 11/11/2021 ? ?PCP: [name], MD ?REFERRING PROVIDER: Aundra Dubin, PA-C ? ? PT End of Session - 11/11/21 1533   ? ? Visit Number 16   ? Number of Visits --   1-2x/week  ? Date for PT Re-Evaluation 01/06/22   ? Authorization Type MCR   ? Authorization Time Period FOTO v6, v10; KX Mod v15   ? Progress Note Due on Visit 10   ? PT Start Time [id number]   ? PT Stop Time 0410   ? PT Time Calculation (min) 38 min   ? Activity Tolerance Patient tolerated treatment well;Patient limited by pain   ? Behavior During Therapy [facility] for tasks assessed/performed   ? ?  ?  ? ?  ? ? ? ? ? ?Past Medical History:  ?Diagnosis Date  ? Arthritis   ? Chronic back pain   ? lumbar stenosis  ? Early cataracts, bilateral   ? GERD (gastroesophageal reflux disease)   ? occasionally but no meds required  ? Headache(784.0)   ? occasionally  ? History of blood transfusion   ? History of colon polyps   ? Hyperlipidemia   ? takes Lipitor every other day  ? Hypertension   ? takes losartan daily  ? Neuropathy   ? Osteoarthritis of left knee 04/27/2014  ? Restless leg   ? Shortness of breath   ? with exertion  ? SOB (shortness of breath) 2013  ? CPET  ? Tear of medial meniscus of left knee 09/08/2013  ? ?Past Surgical History:  ?Procedure Laterality Date  ? APPENDECTOMY    ? BACK SURGERY  2010  ? lumb fusion  ? COLONOSCOPY    ? EYE SURGERY    ? both cataracts  ? KNEE ARTHROSCOPY WITH MEDIAL MENISECTOMY Left 09/08/2013  ? Procedure: LEFT KNEE ARTHROSCOPY WITH PARTIAL MEDIAL MENISCECTOMY;  Surgeon: [name], MD;  Location: [place];  Service: Orthopedics;  Laterality: Left;  ? LUMBAR LAMINECTOMY/DECOMPRESSION MICRODISCECTOMY  05/13/2012  ? Procedure: LUMBAR LAMINECTOMY/DECOMPRESSION MICRODISCECTOMY 1 LEVEL;  Surgeon: [name], MD;  Location: [place] [facility];  Service: Neurosurgery;   Laterality: Bilateral;  Bilateral Lumbar one -two Decompressive Laminectomy  ? PARTIAL KNEE ARTHROPLASTY Left 04/27/2014  ? Procedure: LEFT UNICOMPARTMENTAL KNEE;  Surgeon: [name], MD;  Location: [place];  Service: Orthopedics;  Laterality: Left;  ? UPPER GI ENDOSCOPY    ? ?Patient Active Problem List  ? Diagnosis Date Noted  ? Arthritis of right acromioclavicular joint 08/28/2021  ? Impingement syndrome of right shoulder 08/19/2021  ? Nontraumatic incomplete tear of right rotator cuff 08/19/2021  ? Paresthesia 12/05/2020  ? Low back pain with right-sided sciatica 12/05/2020  ? Trochanteric bursitis, right hip 04/24/2020  ? Essential hypertension 01/12/2020  ? Sinus bradycardia 01/12/2020  ? Dyspnea on exertion 01/12/2020  ? Lumbar stenosis with neurogenic claudication 02/01/2017  ? Osteoarthritis of left knee 04/27/2014  ? Tear of medial meniscus of left knee 09/08/2013  ? ? ?REFERRING DIAG: Z98.890 (ICD-10-CM) - S/P arthroscopy of right shoulder ? ?THERAPY DIAG:  ?Chronic right shoulder pain - Plan: PT plan of care cert/re-cert ? ?Muscle weakness (generalized) - Plan: PT plan of care cert/re-cert ? ?PERTINENT HISTORY: Rt shoulder debridement, SAD, and biceps tenotomy 08/28/2021 ?L1-L2 lumbar laminectomy/ decompression in 2013 ? ?PRECAUTIONS: Shoulder; Rt shoulder biceps tenotomy ([facility]) ? ?SUBJECTIVE:  ? ?  Pt reports that she is "hurting all over" today.  She is not sure why.  She wonders if she can continue PT. ? ? ?PAIN:  ?Are you having pain? Yes ?NPRS scale: 3-4/10 ?Pain location: Rt shoulder ?PAIN TYPE: aching and sharp ?Pain description: intermittent  ?Aggravating factors: lifting arm overhead and quick movements ?Relieving factors: pain medication, ice, and rest ? ? ? ? ?OBJECTIVE:  ? *Unless otherwise noted, objective information collected previously* ? ?DIAGNOSTIC FINDINGS:  ?None after surgery. ?  ?Prior to surgery: ?05/31/2021: MR Shoulder Rt without  contrast: IMPRESSION: ?1. Diffuse severe supraspinatus tendinosis with full-thickness tear ?of the anterior tendon proximal to the insertion. ?2. Severe distal infraspinatus tendinosis with small intrasubstance ?tear. ?3. Moderate distal subscapularis tendinosis with small ?intrasubstance tear. ?4. Severe biceps tendinosis with medial subluxation and small ?partial tear within the bicipital groove. ?5. Severe acromioclavicular and mild glenohumeral osteoarthritis. ?  ?PATIENT SURVEYS:  ?FOTO 53%, predicted 67% in 15 visits ?  ?COGNITION: ?         Overall cognitive status: Within functional limits for tasks assessed ?                              ?SENSATION: ?         Light touch: Appears intact ?  ?POSTURE: ?Forward head, BIL rounded shoulders ?  ?UPPER EXTREMITY AROM/PROM: ?  ?A/PROM Right ?09/17/2021 Left ?09/17/2021 Right ?09/23/2021 Right ?09/25/2021 Right ?09/30/21 R ?3/16 R ?3/23 R ?3/23  ?Shoulder flexion 80/110p! 180 115/120p! 110/125p! 130/135p! 130  130/140 no pain  ?Shoulder abduction 78/103p! 180 80/90p! 75/90p! 90/105p!     ?Shoulder internal rotation 50/65p! 90 85p! 86      ?Shoulder external rotation 50/60p! 80 60/65p! 50/55p! 60/66p!  Full no pain in normal range   ?Elbow flexion 130/130 130/130        ?Elbow extension 0/0 0/0        ?(Blank rows = not tested) ?  ?UPPER EXTREMITY MMT: ?  ?MMT Right ?09/17/2021 Left ?09/17/2021 Right ?10/23/2021 R ?4/18  ?Shoulder flexion 3/5p! 4+/5 4/5p! 4+  ?Shoulder abduction 3/5p! 4+/5 5/5   ?Shoulder internal rotation 4/5 5/5 5/5   ?Shoulder external rotation 3+/5p! 5/5 4/5p! 4 p!  ?Middle trapezius        ?Latissimus Dorsi        ?Elbow flexion 4+/5 5/5    ?Elbow extension 5/5 5/5    ?Grip strength (lbs) 42 44    ?(Blank rows = not tested) ?  ?  ?JOINT MOBILITY TESTING:  ?Mild hypomobility with minor pain with GHJ joint mobilization AP/PA/Inf ?  ?PALPATION:  ?TTP to surgical incisions at Rt shoulder ?           ?TODAY'S TREATMENT:  ? ?[place] Adult PT Treatment:                                                 DATE: 11/11/2021 ?Therapeutic Exercise: ?UBE - L3 - 5' forward ?Seated low rows with 35# cable 2x10 ?Seated high rows with 35# cable 2x10 ?Seated lat pull-down in comfortable elevation range - 3x10 - 35# ?OH flexion press - 1# - 2x10 ?Lateral elbow raise raise - 3x15 ?Wall push up - 3x10 ?Flexion slides on wall - 2x10 ?IR - Black TB - 3x10 Rt ?ER -  RTB - 3x10 Rt ?Biceps curl 3x10 6# ?Stepwise Rt shoulder scaption 3# dumbbell lift to shoulder-level shelf and then to overhead shelf and then back down again 2x10 ? ?Therapeutic Activity ?- collecting information for goals, checking progress, and reviewing with patient ? ?[facility] Adult PT Treatment:                                                DATE: 11/06/2021 ?Therapeutic Exercise: ?UBE - L3 - 5' forward ?Seated low rows with 30# cable 2x10 ?Seated high rows with 30# cable 2x10 ?Seated lat pull-down in comfortable elevation range - 3x10 - 30# ?OH flexion press - 1# - 2x10 ?Lateral raise - 2x10 ?Wall push up - 3x10 ?Flexion slides on wall - 2x10 ?IR - Black TB - 3x10 Rt ?ER - RTB - 3x10 Rt ?Biceps curl 3x10 6# ?Stepwise Rt shoulder scaption 4# dumbbell lift to shoulder-level shelf and then to overhead shelf and then back down again 2x5 ? ?Manual Therapy (concentrating on increasing extensibility of restricted tissue to reduce discomfort and improve mechanics in functional movement): ?STM L UT ?Lateral glide lower Cx spine G III ? ?[facility] Adult PT Treatment:                                                DATE: 11/04/2021 ?Therapeutic Exercise: ?UBE - L3 - 5' forward ?Seated low rows with 30# cable 2x10 ?Seated high rows with 30# cable 2x10 ?Seated lat pull-down in comfortable elevation range - 3x10 - 30# ?OH flexion press - 1# - 2x10 ?Lateral raise - 2x10 ?Wall push up - 3x10 ?Flexion slides on wall - 2x10 ?IR - BTB - 3x10 Rt ?ER - RTB - 3x10 Rt ?Biceps curl 3x10 6# ?Farmers carry - 10# - 61' ?Stepwise Rt shoulder scaption 3# dumbbell lift to  shoulder-level shelf and then to overhead shelf and then back down again 2x10 ? ?Manual Therapy (concentrating on increasing extensibility of restricted tissue to reduce discomfort and improve mechanics in functional mo

## 2021-11-18 ENCOUNTER — Ambulatory Visit: Payer: Medicare Other | Attending: Physician Assistant | Admitting: Physical Therapy

## 2021-11-18 ENCOUNTER — Ambulatory Visit: Payer: Medicare Other

## 2021-11-18 ENCOUNTER — Encounter: Payer: Self-pay | Admitting: Physical Therapy

## 2021-11-18 DIAGNOSIS — M25511 Pain in right shoulder: Secondary | ICD-10-CM | POA: Insufficient documentation

## 2021-11-18 DIAGNOSIS — M6281 Muscle weakness (generalized): Secondary | ICD-10-CM | POA: Diagnosis not present

## 2021-11-18 DIAGNOSIS — G8929 Other chronic pain: Secondary | ICD-10-CM | POA: Diagnosis not present

## 2021-11-18 NOTE — Therapy (Signed)
? ?OUTPATIENT PHYSICAL THERAPY TREATMENT NOTE ? ? ?Patient Name: Melissa Maldonado ?MRN: [id number] ?DOB:1941/05/20, 81 y.o., female ?Today's Date: 11/18/2021 ? ?PCP: [name], MD ?REFERRING PROVIDER: Aundra Dubin, PA-C ? ? PT End of Session - 11/18/21 1317   ? ? Visit Number [id number]   ? Date for PT Re-Evaluation 01/06/22   ? Authorization Type MCR   ? Authorization Time Period FOTO v6, v10; KX Mod v15   ? Progress Note Due on Visit 10   ? PT Start Time 1315   ? PT Stop Time 1355   ? PT Time Calculation (min) 40 min   ? ?  ?  ? ?  ? ? ? ? ? ?Past Medical History:  ?Diagnosis Date  ? Arthritis   ? Chronic back pain   ? lumbar stenosis  ? Early cataracts, bilateral   ? GERD (gastroesophageal reflux disease)   ? occasionally but no meds required  ? Headache(784.0)   ? occasionally  ? History of blood transfusion   ? History of colon polyps   ? Hyperlipidemia   ? takes Lipitor every other day  ? Hypertension   ? takes losartan daily  ? Neuropathy   ? Osteoarthritis of left knee 04/27/2014  ? Restless leg   ? Shortness of breath   ? with exertion  ? SOB (shortness of breath) 2013  ? CPET  ? Tear of medial meniscus of left knee 09/08/2013  ? ?Past Surgical History:  ?Procedure Laterality Date  ? APPENDECTOMY    ? BACK SURGERY  2010  ? lumb fusion  ? COLONOSCOPY    ? EYE SURGERY    ? both cataracts  ? KNEE ARTHROSCOPY WITH MEDIAL MENISECTOMY Left 09/08/2013  ? Procedure: LEFT KNEE ARTHROSCOPY WITH PARTIAL MEDIAL MENISCECTOMY;  Surgeon: [name], MD;  Location: [place];  Service: Orthopedics;  Laterality: Left;  ? LUMBAR LAMINECTOMY/DECOMPRESSION MICRODISCECTOMY  05/13/2012  ? Procedure: LUMBAR LAMINECTOMY/DECOMPRESSION MICRODISCECTOMY 1 LEVEL;  Surgeon: [name], MD;  Location: [place] [facility];  Service: Neurosurgery;  Laterality: Bilateral;  Bilateral Lumbar one -two Decompressive Laminectomy  ? PARTIAL KNEE ARTHROPLASTY Left 04/27/2014  ? Procedure: LEFT UNICOMPARTMENTAL KNEE;  Surgeon: [name], MD;  Location: [place];  Service: Orthopedics;  Laterality: Left;  ? UPPER GI ENDOSCOPY    ? ?Patient Active Problem List  ? Diagnosis Date Noted  ? Arthritis of right acromioclavicular joint 08/28/2021  ? Impingement syndrome of right shoulder 08/19/2021  ? Nontraumatic incomplete tear of right rotator cuff 08/19/2021  ? Paresthesia 12/05/2020  ? Low back pain with right-sided sciatica 12/05/2020  ? Trochanteric bursitis, right hip 04/24/2020  ? Essential hypertension 01/12/2020  ? Sinus bradycardia 01/12/2020  ? Dyspnea on exertion 01/12/2020  ? Lumbar stenosis with neurogenic claudication 02/01/2017  ? Osteoarthritis of left knee 04/27/2014  ? Tear of medial meniscus of left knee 09/08/2013  ? ? ?REFERRING DIAG: Z98.890 (ICD-10-CM) - S/P arthroscopy of right shoulder ? ?THERAPY DIAG:  ?Chronic right shoulder pain ? ?Muscle weakness (generalized) ? ?PERTINENT HISTORY: Rt shoulder debridement, SAD, and biceps tenotomy 08/28/2021 ?L1-L2 lumbar laminectomy/ decompression in 2013 ? ?PRECAUTIONS: Shoulder; Rt shoulder biceps tenotomy ([facility]) ? ?SUBJECTIVE:  ? ?My whole body aches. Knees and back.  ? ? ?PAIN:  ?Are you having pain? Yes but not in my shoulder ?NPRS scale: 0/10 ?Pain location: Rt shoulder ?PAIN TYPE: aching and sharp ?Pain description: intermittent  ?Aggravating factors: lifting arm overhead  and quick movements ?Relieving factors: pain medication, ice, and rest ? ? ? ? ?OBJECTIVE:  ? *Unless otherwise noted, objective information collected previously* ? ?DIAGNOSTIC FINDINGS:  ?None after surgery. ?  ?Prior to surgery: ?05/31/2021: MR Shoulder Rt without contrast: IMPRESSION: ?1. Diffuse severe supraspinatus tendinosis with full-thickness tear ?of the anterior tendon proximal to the insertion. ?2. Severe distal infraspinatus tendinosis with small intrasubstance ?tear. ?3. Moderate distal subscapularis tendinosis with small ?intrasubstance tear. ?4. Severe  biceps tendinosis with medial subluxation and small ?partial tear within the bicipital groove. ?5. Severe acromioclavicular and mild glenohumeral osteoarthritis. ?  ?PATIENT SURVEYS:  ?FOTO 53%, predicted 67% in 15 visits ?  ?COGNITION: ?         Overall cognitive status: Within functional limits for tasks assessed ?                              ?SENSATION: ?         Light touch: Appears intact ?  ?POSTURE: ?Forward head, BIL rounded shoulders ?  ?UPPER EXTREMITY AROM/PROM: ?  ?A/PROM Right ?09/17/2021 Left ?09/17/2021 Right ?09/23/2021 Right ?09/25/2021 Right ?09/30/21 R ?3/16 R ?3/23 R ?3/23 R ?11/18/21 L ?11/18/21  ?Shoulder flexion 80/110p! 180 115/120p! 110/125p! 130/135p! 130  130/140 no pain 135 AROM 138 AROM  ?Shoulder abduction 78/103p! 180 80/90p! 75/90p! 90/105p!    130 150  ?Shoulder internal rotation 50/65p! 90 85p! 86     Lower thoracic reach Mid thoracic reach ?Reach   ?Shoulder external rotation 50/60p! 80 60/65p! 50/55p! 60/66p!  Full no pain in normal range  Reach T2 Reach T2  ?Elbow flexion 130/130 130/130          ?Elbow extension 0/0 0/0          ?(Blank rows = not tested) ?  ?UPPER EXTREMITY MMT: ?  ?MMT Right ?09/17/2021 Left ?09/17/2021 Right ?10/23/2021 R ?4/18 R ?11/18/21  ?Shoulder flexion 3/5p! 4+/5 4/5p! 4+   ?Shoulder abduction 3/5p! 4+/5 5/5    ?Shoulder internal rotation 4/5 5/5 5/5    ?Shoulder external rotation 3+/5p! 5/5 4/5p! 4 p! 4p!  ?Middle trapezius         ?Latissimus Dorsi         ?Elbow flexion 4+/5 5/5     ?Elbow extension 5/5 5/5     ?Grip strength (lbs) 42 44     ?(Blank rows = not tested) ?  ?  ?JOINT MOBILITY TESTING:  ?Mild hypomobility with minor pain with GHJ joint mobilization AP/PA/Inf ?  ?PALPATION:  ?TTP to surgical incisions at Rt shoulder ?           ?TODAY'S TREATMENT:  ?[facility] Adult PT Treatment:                                                DATE: 11/18/2021 ?Therapeutic Exercise: ?UBE - L3 - 5' forward ?AROM/MMT ?Seated low rows with 35# cable 2x10 ?Seated high rows with 35#  cable 2x10 ?Seated lat pull-down in comfortable elevation range - 3x10 - 35# ?ER - RTB - 3x10  bilat ?OH flexion press - 2# - 3x10 ?Seated scaption 2# 3 x 10 ?IR - Black TB - 3x10 Rt ?Biceps curl 3x10 6# ? ? ? ?[facility] Adult PT Treatment:  DATE: 11/11/2021 ?Therapeutic Exercise: ?UBE - L3 - 5' forward ?Seated low rows with 35# cable 2x10 ?Seated high rows with 35# cable 2x10 ?Seated lat pull-down in comfortable elevation range - 3x10 - 35# ?OH flexion press - 1# - 2x10 ?Lateral elbow raise raise - 3x15 ?Wall push up - 3x10 ?Flexion slides on wall - 2x10 ?IR - Black TB - 3x10 Rt ?ER - RTB - 3x10 Rt ?Biceps curl 3x10 6# ?Stepwise Rt shoulder scaption 3# dumbbell lift to shoulder-level shelf and then to overhead shelf and then back down again 2x10 ? ?Therapeutic Activity ?- collecting information for goals, checking progress, and reviewing with patient ? ?[facility] Adult PT Treatment:                                                DATE: 11/06/2021 ?Therapeutic Exercise: ?UBE - L3 - 5' forward ?Seated low rows with 30# cable 2x10 ?Seated high rows with 30# cable 2x10 ?Seated lat pull-down in comfortable elevation range - 3x10 - 30# ?OH flexion press - 1# - 2x10 ?Lateral raise - 2x10 ?Wall push up - 3x10 ?Flexion slides on wall - 2x10 ?IR - Black TB - 3x10 Rt ?ER - RTB - 3x10 Rt ?Biceps curl 3x10 6# ?Stepwise Rt shoulder scaption 4# dumbbell lift to shoulder-level shelf and then to overhead shelf and then back down again 2x5 ? ?Manual Therapy (concentrating on increasing extensibility of restricted tissue to reduce discomfort and improve mechanics in functional movement): ?STM L UT ?Lateral glide lower Cx spine G III ? ?[facility] Adult PT Treatment:                                                DATE: 11/04/2021 ?Therapeutic Exercise: ?UBE - L3 - 5' forward ?Seated low rows with 30# cable 2x10 ?Seated high rows with 30# cable 2x10 ?Seated lat pull-down in comfortable elevation range - 3x10 -  30# ?OH flexion press - 1# - 2x10 ?Lateral raise - 2x10 ?Wall push up - 3x10 ?Flexion slides on wall - 2x10 ?IR - BTB - 3x10 Rt ?ER - RTB - 3x10 Rt ?Biceps curl 3x10 6# ?Farmers carry - 10# - 42' ?Stepwise Rt sh

## 2021-11-25 ENCOUNTER — Encounter: Payer: Self-pay | Admitting: Physical Therapy

## 2021-11-25 ENCOUNTER — Ambulatory Visit: Payer: Medicare Other | Admitting: Physical Therapy

## 2021-11-25 DIAGNOSIS — M6281 Muscle weakness (generalized): Secondary | ICD-10-CM | POA: Diagnosis not present

## 2021-11-25 DIAGNOSIS — G8929 Other chronic pain: Secondary | ICD-10-CM

## 2021-11-25 DIAGNOSIS — M25511 Pain in right shoulder: Secondary | ICD-10-CM | POA: Diagnosis not present

## 2021-11-25 NOTE — Therapy (Signed)
? ?OUTPATIENT PHYSICAL THERAPY TREATMENT NOTE ? ? ?Patient Name: Melissa Maldonado ?MRN: [id number] ?DOB:March 16, 1941, 81 y.o., female ?Today's Date: 11/25/2021 ? ?PCP: [name], MD ?REFERRING PROVIDER: Aundra Dubin, PA-C ? ? PT End of Session - 11/25/21 1529   ? ? Visit Number 18   ? Date for PT Re-Evaluation 01/06/22   ? Authorization Type MCR   ? Authorization Time Period FOTO v6, v10; KX Mod v15   ? Progress Note Due on Visit 10   ? PT Start Time 1530   ? PT Stop Time 3235   ? PT Time Calculation (min) 41 min   ? ?  ?  ? ?  ? ? ? ? ? ?Past Medical History:  ?Diagnosis Date  ? Arthritis   ? Chronic back pain   ? lumbar stenosis  ? Early cataracts, bilateral   ? GERD (gastroesophageal reflux disease)   ? occasionally but no meds required  ? Headache(784.0)   ? occasionally  ? History of blood transfusion   ? History of colon polyps   ? Hyperlipidemia   ? takes Lipitor every other day  ? Hypertension   ? takes losartan daily  ? Neuropathy   ? Osteoarthritis of left knee 04/27/2014  ? Restless leg   ? Shortness of breath   ? with exertion  ? SOB (shortness of breath) 2013  ? CPET  ? Tear of medial meniscus of left knee 09/08/2013  ? ?Past Surgical History:  ?Procedure Laterality Date  ? APPENDECTOMY    ? BACK SURGERY  2010  ? lumb fusion  ? COLONOSCOPY    ? EYE SURGERY    ? both cataracts  ? KNEE ARTHROSCOPY WITH MEDIAL MENISECTOMY Left 09/08/2013  ? Procedure: LEFT KNEE ARTHROSCOPY WITH PARTIAL MEDIAL MENISCECTOMY;  Surgeon: [name], MD;  Location: [place];  Service: Orthopedics;  Laterality: Left;  ? LUMBAR LAMINECTOMY/DECOMPRESSION MICRODISCECTOMY  05/13/2012  ? Procedure: LUMBAR LAMINECTOMY/DECOMPRESSION MICRODISCECTOMY 1 LEVEL;  Surgeon: [name], MD;  Location: [place] [facility];  Service: Neurosurgery;  Laterality: Bilateral;  Bilateral Lumbar one -two Decompressive Laminectomy  ? PARTIAL KNEE ARTHROPLASTY Left 04/27/2014  ? Procedure: LEFT UNICOMPARTMENTAL KNEE;  Surgeon: [name], MD;  Location: [place];  Service: Orthopedics;  Laterality: Left;  ? UPPER GI ENDOSCOPY    ? ?Patient Active Problem List  ? Diagnosis Date Noted  ? Arthritis of right acromioclavicular joint 08/28/2021  ? Impingement syndrome of right shoulder 08/19/2021  ? Nontraumatic incomplete tear of right rotator cuff 08/19/2021  ? Paresthesia 12/05/2020  ? Low back pain with right-sided sciatica 12/05/2020  ? Trochanteric bursitis, right hip 04/24/2020  ? Essential hypertension 01/12/2020  ? Sinus bradycardia 01/12/2020  ? Dyspnea on exertion 01/12/2020  ? Lumbar stenosis with neurogenic claudication 02/01/2017  ? Osteoarthritis of left knee 04/27/2014  ? Tear of medial meniscus of left knee 09/08/2013  ? ? ?REFERRING DIAG: Z98.890 (ICD-10-CM) - S/P arthroscopy of right shoulder ? ?THERAPY DIAG:  ?Chronic right shoulder pain ? ?Muscle weakness (generalized) ? ?PERTINENT HISTORY: Rt shoulder debridement, SAD, and biceps tenotomy 08/28/2021 ?L1-L2 lumbar laminectomy/ decompression in 2013 ? ?PRECAUTIONS: Shoulder; Rt shoulder biceps tenotomy ([facility]) ? ?SUBJECTIVE:  ? ?Pt report that she does lots of activity with her shoulder it will hurt a bit, but overall it is feeling better. ? ?PAIN:  ?Are you having pain? Yes but not in my shoulder ?NPRS scale: 0/10 ?Pain location: Rt shoulder ?PAIN  TYPE: aching and sharp ?Pain description: intermittent  ?Aggravating factors: lifting arm overhead and quick movements ?Relieving factors: pain medication, ice, and rest ? ? ? ? ?OBJECTIVE:  ? *Unless otherwise noted, objective information collected previously* ? ?DIAGNOSTIC FINDINGS:  ?None after surgery. ?  ?Prior to surgery: ?05/31/2021: MR Shoulder Rt without contrast: IMPRESSION: ?1. Diffuse severe supraspinatus tendinosis with full-thickness tear ?of the anterior tendon proximal to the insertion. ?2. Severe distal infraspinatus tendinosis with small intrasubstance ?tear. ?3. Moderate distal  subscapularis tendinosis with small ?intrasubstance tear. ?4. Severe biceps tendinosis with medial subluxation and small ?partial tear within the bicipital groove. ?5. Severe acromioclavicular and mild glenohumeral osteoarthritis. ?  ?PATIENT SURVEYS:  ?FOTO 53%, predicted 67% in 15 visits ?  ?COGNITION: ?         Overall cognitive status: Within functional limits for tasks assessed ?                              ?SENSATION: ?         Light touch: Appears intact ?  ?POSTURE: ?Forward head, BIL rounded shoulders ?  ?UPPER EXTREMITY AROM/PROM: ?  ?A/PROM Right ?09/17/2021 Left ?09/17/2021 Right ?09/23/2021 Right ?09/25/2021 Right ?09/30/21 R ?3/16 R ?3/23 R ?3/23 R ?11/18/21 L ?11/18/21  ?Shoulder flexion 80/110p! 180 115/120p! 110/125p! 130/135p! 130  130/140 no pain 135 AROM 138 AROM  ?Shoulder abduction 78/103p! 180 80/90p! 75/90p! 90/105p!    130 150  ?Shoulder internal rotation 50/65p! 90 85p! 86     Lower thoracic reach Mid thoracic reach ?Reach   ?Shoulder external rotation 50/60p! 80 60/65p! 50/55p! 60/66p!  Full no pain in normal range  Reach T2 Reach T2  ?Elbow flexion 130/130 130/130          ?Elbow extension 0/0 0/0          ?(Blank rows = not tested) ?  ?UPPER EXTREMITY MMT: ?  ?MMT Right ?09/17/2021 Left ?09/17/2021 Right ?10/23/2021 R ?4/18 R ?11/18/21  ?Shoulder flexion 3/5p! 4+/5 4/5p! 4+   ?Shoulder abduction 3/5p! 4+/5 5/5    ?Shoulder internal rotation 4/5 5/5 5/5    ?Shoulder external rotation 3+/5p! 5/5 4/5p! 4 p! 4p!  ?Middle trapezius         ?Latissimus Dorsi         ?Elbow flexion 4+/5 5/5     ?Elbow extension 5/5 5/5     ?Grip strength (lbs) 42 44     ?(Blank rows = not tested) ?  ?  ?JOINT MOBILITY TESTING:  ?Mild hypomobility with minor pain with GHJ joint mobilization AP/PA/Inf ?  ?PALPATION:  ?TTP to surgical incisions at Rt shoulder ?           ?TODAY'S TREATMENT:  ? ?[facility] Adult PT Treatment:                                                DATE: 11/25/2021 ?Therapeutic Exercise: ?UBE - L3 - 5' forward ?Seated  low rows with 40# cable 2x10 ?Seated high rows with 40# cable 2x10 ?Seated lat pull-down in comfortable elevation range - 3x10 - 40# ?OH flexion press - 2# - 3x10 ?Lateral elbow raise - 3x10 - 1# ?Wall push up - 3x10 ?Flexion slides on wall - 2x10 ?IR - Black TB - 3x10 Rt ?ER - GTB -  3x10 Rt ?Biceps curl 3x10 7# ?Quad set with towel - 5'' 2x10 (R) ?SLR - 3x10 (R) ?Stepwise Rt shoulder scaption 3# dumbbell lift to shoulder-level shelf and then to overhead shelf and then back down again 2x10 (NT) ? ?[facility] Adult PT Treatment:                                                DATE: 11/18/2021 ?Therapeutic Exercise: ?UBE - L3 - 5' forward ?AROM/MMT ?Seated low rows with 35# cable 2x10 ?Seated high rows with 35# cable 2x10 ?Seated lat pull-down in comfortable elevation range - 3x10 - 35# ?ER - RTB - 3x10  bilat ?OH flexion press - 2# - 3x10 ?Seated scaption 2# 3 x 10 ?IR - Black TB - 3x10 Rt ?Biceps curl 3x10 6# ? ? ? ?[facility] Adult PT Treatment:                                                DATE: 11/11/2021 ?Therapeutic Exercise: ?UBE - L3 - 5' forward ?Seated low rows with 35# cable 2x10 ?Seated high rows with 35# cable 2x10 ?Seated lat pull-down in comfortable elevation range - 3x10 - 35# ?OH flexion press - 1# - 2x10 ?Lateral elbow raise raise - 3x15 ?Wall push up - 3x10 ?Flexion slides on wall - 2x10 ?IR - Black TB - 3x10 Rt ?ER - RTB - 3x10 Rt ?Biceps curl 3x10 6# ?Stepwise Rt shoulder scaption 3# dumbbell lift to shoulder-level shelf and then to overhead shelf and then back down again 2x10 ? ?Therapeutic Activity ?- collecting information for goals, checking progress, and reviewing with patient ? ?[facility] Adult PT Treatment:                                                DATE: 11/06/2021 ?Therapeutic Exercise: ?UBE - L3 - 5' forward ?Seated low rows with 30# cable 2x10 ?Seated high rows with 30# cable 2x10 ?Seated lat pull-down in comfortable elevation range - 3x10 - 30# ?OH flexion press - 1# - 2x10 ?Lateral raise -  2x10 ?Wall push up - 3x10 ?Flexion slides on wall - 2x10 ?IR - Black TB - 3x10 Rt ?ER - RTB - 3x10 Rt ?Biceps curl 3x10 6# ?Stepwise Rt shoulder scaption 4# dumbbell lift to shoulder-level shelf and then to [employer]

## 2021-12-02 ENCOUNTER — Ambulatory Visit (INDEPENDENT_AMBULATORY_CARE_PROVIDER_SITE_OTHER): Payer: Medicare Other

## 2021-12-02 ENCOUNTER — Ambulatory Visit (INDEPENDENT_AMBULATORY_CARE_PROVIDER_SITE_OTHER): Payer: Medicare Other | Admitting: Family Medicine

## 2021-12-02 ENCOUNTER — Ambulatory Visit: Payer: Self-pay

## 2021-12-02 ENCOUNTER — Encounter: Payer: Self-pay | Admitting: Physical Therapy

## 2021-12-02 ENCOUNTER — Encounter: Payer: Self-pay | Admitting: Family Medicine

## 2021-12-02 ENCOUNTER — Ambulatory Visit: Payer: Medicare Other | Admitting: Physical Therapy

## 2021-12-02 VITALS — BP 130/80 | HR 50 | Ht 65.0 in | Wt 195.6 lb

## 2021-12-02 DIAGNOSIS — G8929 Other chronic pain: Secondary | ICD-10-CM | POA: Diagnosis not present

## 2021-12-02 DIAGNOSIS — M25562 Pain in left knee: Secondary | ICD-10-CM

## 2021-12-02 DIAGNOSIS — M6281 Muscle weakness (generalized): Secondary | ICD-10-CM | POA: Diagnosis not present

## 2021-12-02 DIAGNOSIS — M25511 Pain in right shoulder: Secondary | ICD-10-CM | POA: Diagnosis not present

## 2021-12-02 DIAGNOSIS — M25561 Pain in right knee: Secondary | ICD-10-CM | POA: Diagnosis not present

## 2021-12-02 DIAGNOSIS — M1711 Unilateral primary osteoarthritis, right knee: Secondary | ICD-10-CM

## 2021-12-02 NOTE — Therapy (Signed)
? ?OUTPATIENT PHYSICAL THERAPY TREATMENT NOTE ? ? ?Patient Name: Melissa Maldonado ?MRN: [id number] ?DOB:1940-08-14, 81 y.o., female ?Today's Date: 12/02/2021 ? ?PCP: [name], MD ?REFERRING PROVIDER: Aundra Dubin, PA-C ? ? PT End of Session - 12/02/21 1527   ? ? Visit Number [id number]   ? Date for PT Re-Evaluation 01/06/22   ? Authorization Type MCR   ? Authorization Time Period FOTO v6, v10; KX Mod v15   ? Progress Note Due on Visit 10   ? PT Start Time 1530   ? PT Stop Time 1610   ? PT Time Calculation (min) 40 min   ? ?  ?  ? ?  ? ? ? ? ? ?Past Medical History:  ?Diagnosis Date  ? Arthritis   ? Chronic back pain   ? lumbar stenosis  ? Early cataracts, bilateral   ? GERD (gastroesophageal reflux disease)   ? occasionally but no meds required  ? Headache(784.0)   ? occasionally  ? History of blood transfusion   ? History of colon polyps   ? Hyperlipidemia   ? takes Lipitor every other day  ? Hypertension   ? takes losartan daily  ? Neuropathy   ? Osteoarthritis of left knee 04/27/2014  ? Restless leg   ? Shortness of breath   ? with exertion  ? SOB (shortness of breath) 2013  ? CPET  ? Tear of medial meniscus of left knee 09/08/2013  ? ?Past Surgical History:  ?Procedure Laterality Date  ? APPENDECTOMY    ? BACK SURGERY  2010  ? lumb fusion  ? COLONOSCOPY    ? EYE SURGERY    ? both cataracts  ? KNEE ARTHROSCOPY WITH MEDIAL MENISECTOMY Left 09/08/2013  ? Procedure: LEFT KNEE ARTHROSCOPY WITH PARTIAL MEDIAL MENISCECTOMY;  Surgeon: [name], MD;  Location: [facility];  Service: Orthopedics;  Laterality: Left;  ? LUMBAR LAMINECTOMY/DECOMPRESSION MICRODISCECTOMY  05/13/2012  ? Procedure: LUMBAR LAMINECTOMY/DECOMPRESSION MICRODISCECTOMY 1 LEVEL;  Surgeon: [name], MD;  Location: [facility];  Service: Neurosurgery;  Laterality: Bilateral;  Bilateral Lumbar one -two Decompressive Laminectomy  ? PARTIAL KNEE ARTHROPLASTY Left 04/27/2014  ? Procedure: LEFT UNICOMPARTMENTAL KNEE;  Surgeon: [name], MD;  Location: [facility];  Service: Orthopedics;  Laterality: Left;  ? UPPER GI ENDOSCOPY    ? ?Patient Active Problem List  ? Diagnosis Date Noted  ? Arthritis of right acromioclavicular joint 08/28/2021  ? Impingement syndrome of right shoulder 08/19/2021  ? Nontraumatic incomplete tear of right rotator cuff 08/19/2021  ? Paresthesia 12/05/2020  ? Low back pain with right-sided sciatica 12/05/2020  ? Trochanteric bursitis, right hip 04/24/2020  ? Essential hypertension 01/12/2020  ? Sinus bradycardia 01/12/2020  ? Dyspnea on exertion 01/12/2020  ? Lumbar stenosis with neurogenic claudication 02/01/2017  ? Osteoarthritis of left knee 04/27/2014  ? Tear of medial meniscus of left knee 09/08/2013  ? ? ?REFERRING DIAG: Z98.890 (ICD-10-CM) - S/P arthroscopy of right shoulder ? ?THERAPY DIAG:  ?Chronic right shoulder pain ? ?Muscle weakness (generalized) ? ?PERTINENT HISTORY: Rt shoulder debridement, SAD, and biceps tenotomy 08/28/2021 ?L1-L2 lumbar laminectomy/ decompression in 2013 ? ?PRECAUTIONS: Shoulder; Rt shoulder biceps tenotomy ([facility]) ? ?SUBJECTIVE:  ? ?Pt reports that her R shoulder still has a "cold" sensation occasionally, but that overall it is feeling better.  She feels her R shoulder is still weak. ? ?PAIN:  ?Are you having pain? Yes but not in my shoulder ?NPRS scale:  0/10 ?Pain location: Rt shoulder ?PAIN TYPE: aching and sharp ?Pain description: intermittent  ?Aggravating factors: lifting arm overhead and quick movements ?Relieving factors: pain medication, ice, and rest ? ? ? ? ?OBJECTIVE:  ? *Unless otherwise noted, objective information collected previously* ? ?DIAGNOSTIC FINDINGS:  ?None after surgery. ?  ?Prior to surgery: ?05/31/2021: MR Shoulder Rt without contrast: IMPRESSION: ?1. Diffuse severe supraspinatus tendinosis with full-thickness tear ?of the anterior tendon proximal to the insertion. ?2. Severe distal infraspinatus tendinosis with small  intrasubstance ?tear. ?3. Moderate distal subscapularis tendinosis with small ?intrasubstance tear. ?4. Severe biceps tendinosis with medial subluxation and small ?partial tear within the bicipital groove. ?5. Severe acromioclavicular and mild glenohumeral osteoarthritis. ?  ?PATIENT SURVEYS:  ?FOTO 53%, predicted 67% in 15 visits ?  ?COGNITION: ?         Overall cognitive status: Within functional limits for tasks assessed ?                              ?SENSATION: ?         Light touch: Appears intact ?  ?POSTURE: ?Forward head, BIL rounded shoulders ?  ?UPPER EXTREMITY AROM/PROM: ?  ?A/PROM Right ?09/17/2021 Left ?09/17/2021 Right ?09/23/2021 Right ?09/25/2021 Right ?09/30/21 R ?3/16 R ?3/23 R ?3/23 R ?11/18/21 L ?11/18/21  ?Shoulder flexion 80/110p! 180 115/120p! 110/125p! 130/135p! 130  130/140 no pain 135 AROM 138 AROM  ?Shoulder abduction 78/103p! 180 80/90p! 75/90p! 90/105p!    130 150  ?Shoulder internal rotation 50/65p! 90 85p! 86     Lower thoracic reach Mid thoracic reach ?Reach   ?Shoulder external rotation 50/60p! 80 60/65p! 50/55p! 60/66p!  Full no pain in normal range  Reach T2 Reach T2  ?Elbow flexion 130/130 130/130          ?Elbow extension 0/0 0/0          ?(Blank rows = not tested) ?  ?UPPER EXTREMITY MMT: ?  ?MMT Right ?09/17/2021 Left ?09/17/2021 Right ?10/23/2021 R ?4/18 R ?11/18/21  ?Shoulder flexion 3/5p! 4+/5 4/5p! 4+   ?Shoulder abduction 3/5p! 4+/5 5/5    ?Shoulder internal rotation 4/5 5/5 5/5    ?Shoulder external rotation 3+/5p! 5/5 4/5p! 4 p! 4p!  ?Middle trapezius         ?Latissimus Dorsi         ?Elbow flexion 4+/5 5/5     ?Elbow extension 5/5 5/5     ?Grip strength (lbs) 42 44     ?(Blank rows = not tested) ?  ?  ?JOINT MOBILITY TESTING:  ?Mild hypomobility with minor pain with GHJ joint mobilization AP/PA/Inf ?  ?PALPATION:  ?TTP to surgical incisions at Rt shoulder ?           ?TODAY'S TREATMENT:  ? ?[facility] Adult PT Treatment:                                                DATE:  12/02/2021 ?Therapeutic Exercise: ?UBE - L3 - 5' forward ?Seated low rows with 40# cable 2x10 ?Seated high rows with 40# cable 2x10 ?Seated lat pull-down in comfortable elevation range - 3x10 - 40# ?OH flexion press - 2# - 3x10 ?Lateral elbow raise - 3x10 - 2# ?Wall push up - 3x10 ?Flexion slides on wall - 2x20 ?YTB scaption - 3x10 ?IR -  Black TB - 3x12 Rt ?ER - GTB - 3x12 Rt ?Biceps curl 3x10 7# (NT) ?HS curl - 20# ?Knee ext machine - 20# ?Stepwise Rt shoulder scaption 4# dumbbell lift to shoulder-level shelf and then to overhead shelf and then back down again 3x5 ? ?[facility] Adult PT Treatment:                                                DATE: 11/25/2021 ?Therapeutic Exercise: ?UBE - L3 - 5' forward ?Seated low rows with 40# cable 2x10 ?Seated high rows with 40# cable 2x10 ?Seated lat pull-down in comfortable elevation range - 3x10 - 40# ?OH flexion press - 2# - 3x10 ?Lateral elbow raise - 3x10 - 1# ?Wall push up - 3x10 ?Flexion slides on wall - 2x10 ?IR - Black TB - 3x10 Rt ?ER - GTB - 3x10 Rt ?Biceps curl 3x10 7# ?Quad set with towel - 5'' 2x10 (R) ?SLR - 3x10 (R) ?Stepwise Rt shoulder scaption 3# dumbbell lift to shoulder-level shelf and then to overhead shelf and then back down again 2x10 (NT) ? ?[facility] Adult PT Treatment:                                                DATE: 11/18/2021 ?Therapeutic Exercise: ?UBE - L3 - 5' forward ?AROM/MMT ?Seated low rows with 35# cable 2x10 ?Seated high rows with 35# cable 2x10 ?Seated lat pull-down in comfortable elevation range - 3x10 - 35# ?ER - RTB - 3x10  bilat ?OH flexion press - 2# - 3x10 ?Seated scaption 2# 3 x 10 ?IR - Black TB - 3x10 Rt ?Biceps curl 3x10 6# ? ? ? ?[facility] Adult PT Treatment:                                                DATE: 11/11/2021 ?Therapeutic Exercise: ?UBE - L3 - 5' forward ?Seated low rows with 35# cable 2x10 ?Seated high rows with 35# cable 2x10 ?Seated lat pull-down in comfortable elevation range - 3x10 - 35# ?OH flexion press - 1# -  2x10 ?Lateral elbow raise raise - 3x15 ?Wall push up - 3x10 ?Flexion slides on wall - 2x10 ?IR - Black TB - 3x10 Rt ?ER - RTB - 3x10 Rt ?Biceps curl 3x10 6# ?Stepwise Rt shoulder scaption 3# dumbbell lift to shoulder-level shelf and then

## 2021-12-02 NOTE — Patient Instructions (Signed)
Good to see you today. ? ?You had a R knee injection.  Call or go to the ER if you develop a large red swollen joint with extreme pain or oozing puss.  ? ?Please get an Xray today before you leave. ? ?Follow-up as needed. ?

## 2021-12-02 NOTE — Progress Notes (Signed)
? ?I, [name], LAT, ATC, am serving as scribe for Dr. [name]. ? ?Melissa Maldonado is a 81 y.o. female who presents to [facility] today for cont bilat knee pain. Of note, pt had a R shoulder arthroscopic debridement, decompression biceps tenotomy on 08/28/21 by Dr. Erlinda Hong and is currently doing PT at the [place] location. Pt was last seen by [name] on 06/25/21 and completed the Gelsyn series 3/3 in her R knee. She was previously seen at [facility] for her L knee.  Today, pt reports that she has been having B knee pain x 2-3 weeks, R>L.  She has a hx of a L knee partial knee replacement performed by [name].  She denies any knee swelling.  She reports that she will leaving for [place] on June 1st. ? ?She notes the gel series did not help very much.   ? ?Dx imaging: 12/02/20 R knee XR ? 04/27/14 L knee XR ? ?Pertinent review of systems: No fevers or chills ? ?Relevant historical information: Left partial knee replacement ? ? ?Exam:  ?BP 130/80 (BP Location: Left Arm, Patient Position: Sitting, Cuff Size: Normal)   Pulse (!) 50   Ht '5\' 5"'$  (1.651 m)   Wt 195 lb 9.6 oz (88.7 kg)   SpO2 98%   BMI 32.55 kg/m?  ?General: Well Developed, well nourished, and in no acute distress.  ? ?MSK: Right knee mild effusion normal motion with crepitation. ?Left knee normal.  Normal motion with crepitation. ? ? ? ?Lab and Radiology Results ? ?Procedure: Real-time Ultrasound Guided Injection of right knee superior lateral patellar space ?Device: Philips Affiniti 50G ?Images permanently stored and available for review in PACS ?Verbal informed consent obtained.  Discussed risks and benefits of procedure. Warned about infection, bleeding, hyperglycemia damage to structures among others. ?Patient expresses understanding and agreement ?Time-out conducted.   ?Noted no overlying erythema, induration, or other signs of local infection.   ?Skin prepped in a  sterile fashion.   ?Local anesthesia: Topical Ethyl chloride.   ?With sterile technique and under real time ultrasound guidance: 40 mg of Kenalog and 2 mL of Marcaine injected into knee joint. Fluid seen entering the joint capsule.   ?Completed without difficulty   ?Pain immediately resolved suggesting accurate placement of the medication.   ?Advised to call if fevers/chills, erythema, induration, drainage, or persistent bleeding.   ?Images permanently stored and available for review in the ultrasound unit.  ?Impression: Technically successful ultrasound guided injection. ? ? ? ?X-ray images bilateral knees obtained today personally and independently interpreted ? ?Right knee: Bone-on-bone DJD medial compartment.  Moderate patellofemoral DJD.  No acute fractures. ? ?Left knee: Medial partial knee replacement.  No significant hardware loosening visible.  Mild to moderate patellofemoral DJD.  Knee is in otherwise good shape. ? ?Await formal radiology review ? ? ? ? ?Assessment and Plan: ?81 y.o. female with bilateral knee pain thought to be due to DJD.  Plan for steroid injection right knee.  She had a trial of gel shots about 6 months ago that she does not think helped very much.  Plan to update x-rays today.  This should help determine future planning including potential knee replacements.  Check back as needed. ? ? ?PDMP not reviewed this encounter. ?Orders Placed This Encounter  ?Procedures  ? [place] LIMITED JOINT SPACE STRUCTURES LOW BILAT(NO LINKED CHARGES)  ?  Order Specific Question:   Reason for Exam (SYMPTOM  OR DIAGNOSIS REQUIRED)  ?  Answer:   B knee pain  ?  Order Specific Question:   Preferred imaging location?  ?  Answer:   [facility]  ? DG Knee AP/LAT W/[place] Left  ?  Standing Status:   Future  ?  Standing Expiration Date:   01/02/2022  ?  Order Specific Question:   Reason for Exam (SYMPTOM  OR DIAGNOSIS REQUIRED)  ?  Answer:   L knee pain  ?  Order Specific Question:   Preferred  imaging location?  ?  Answer:   [facility]  ? DG Knee AP/LAT W/[place] Right  ?  Standing Status:   Future  ?  Standing Expiration Date:   01/02/2022  ?  Order Specific Question:   Reason for Exam (SYMPTOM  OR DIAGNOSIS REQUIRED)  ?  Answer:   r knee pain  ?  Order Specific Question:   Preferred imaging location?  ?  Answer:   [facility]  ? ?No orders of the defined types were placed in this encounter. ? ? ? ?Discussed warning signs or symptoms. Please see discharge instructions. Patient expresses understanding. ? ? ?The above documentation has been reviewed and is accurate and complete [name], M.D. ? ? ?

## 2021-12-04 NOTE — Progress Notes (Signed)
Right knee x-ray shows advanced arthritis

## 2021-12-04 NOTE — Progress Notes (Signed)
Left knee x-ray shows arthritis changes.

## 2021-12-09 ENCOUNTER — Ambulatory Visit: Payer: Medicare Other | Admitting: Physical Therapy

## 2021-12-09 ENCOUNTER — Encounter: Payer: Self-pay | Admitting: Physical Therapy

## 2021-12-09 DIAGNOSIS — G8929 Other chronic pain: Secondary | ICD-10-CM | POA: Diagnosis not present

## 2021-12-09 DIAGNOSIS — M6281 Muscle weakness (generalized): Secondary | ICD-10-CM

## 2021-12-09 DIAGNOSIS — M25511 Pain in right shoulder: Secondary | ICD-10-CM | POA: Diagnosis not present

## 2021-12-09 NOTE — Therapy (Signed)
PHYSICAL THERAPY DISCHARGE SUMMARY  Visits from Start of Care: 20  Current functional level related to goals / functional outcomes: See assessment/goals   Remaining deficits: See assessment/goals   Education / Equipment: HEP and D/C plans  Patient agrees to discharge. Patient goals were met. Patient is being discharged due to meeting the stated rehab goals.   Patient Name: Melissa Maldonado MRN: [id number] DOB:05-Jan-1941, 81 y.o., female Today's Date: 12/09/2021  PCP: [name], MD REFERRING PROVIDER: Aundra Dubin, PA-C   PT End of Session - 12/09/21 1533     Visit Number 20    Date for PT Re-Evaluation 01/06/22    Authorization Type MCR    Authorization Time Period FOTO v6, v10; KX Mod v15    Progress Note Due on Visit 10    PT Start Time 1531    PT Stop Time 1610    PT Time Calculation (min) 39 min                 Past Medical History:  Diagnosis Date   Arthritis    Chronic back pain    lumbar stenosis   Early cataracts, bilateral    GERD (gastroesophageal reflux disease)    occasionally but no meds required   Headache(784.0)    occasionally   History of blood transfusion    History of colon polyps    Hyperlipidemia    takes Lipitor every other day   Hypertension    takes losartan daily   Neuropathy    Osteoarthritis of left knee 04/27/2014   Restless leg    Shortness of breath    with exertion   SOB (shortness of breath) 2013   CPET   Tear of medial meniscus of left knee 09/08/2013   Past Surgical History:  Procedure Laterality Date   APPENDECTOMY     BACK SURGERY  2010   lumb fusion   COLONOSCOPY     EYE SURGERY     both cataracts   KNEE ARTHROSCOPY WITH MEDIAL MENISECTOMY Left 09/08/2013   Procedure: LEFT KNEE ARTHROSCOPY WITH PARTIAL MEDIAL MENISCECTOMY;  Surgeon: [name], MD;  Location: [facility];  Service: Orthopedics;  Laterality: Left;   LUMBAR LAMINECTOMY/DECOMPRESSION MICRODISCECTOMY  05/13/2012    Procedure: LUMBAR LAMINECTOMY/DECOMPRESSION MICRODISCECTOMY 1 LEVEL;  Surgeon: [name], MD;  Location: [facility];  Service: Neurosurgery;  Laterality: Bilateral;  Bilateral Lumbar one -two Decompressive Laminectomy   PARTIAL KNEE ARTHROPLASTY Left 04/27/2014   Procedure: LEFT UNICOMPARTMENTAL KNEE;  Surgeon: [name], MD;  Location: [facility];  Service: Orthopedics;  Laterality: Left;   UPPER GI ENDOSCOPY     Patient Active Problem List   Diagnosis Date Noted   Arthritis of right acromioclavicular joint 08/28/2021   Impingement syndrome of right shoulder 08/19/2021   Nontraumatic incomplete tear of right rotator cuff 08/19/2021   Paresthesia 12/05/2020   Low back pain with right-sided sciatica 12/05/2020   Trochanteric bursitis, right hip 04/24/2020   Essential hypertension 01/12/2020   Sinus bradycardia 01/12/2020   Dyspnea on exertion 01/12/2020   Lumbar stenosis with neurogenic claudication 02/01/2017   Osteoarthritis of left knee 04/27/2014   Tear of medial meniscus of left knee 09/08/2013    REFERRING DIAG: Z98.890 (ICD-10-CM) - S/P arthroscopy of right shoulder  THERAPY DIAG:  Chronic right shoulder pain  Muscle weakness (generalized)  PERTINENT HISTORY: Rt shoulder debridement, SAD, and biceps tenotomy 08/28/2021 L1-L2 lumbar laminectomy/ decompression in 2013  PRECAUTIONS: Shoulder; Rt  shoulder biceps tenotomy ([facility])  SUBJECTIVE:   Pt reports that overall her shoulder is doing well.  She wonders if she can make this her last visit.  PAIN:  Are you having pain? Yes but not in my shoulder NPRS scale: 0/10 Pain location: Rt shoulder PAIN TYPE: aching and sharp Pain description: intermittent  Aggravating factors: lifting arm overhead and quick movements Relieving factors: pain medication, ice, and rest     OBJECTIVE:   *Unless otherwise noted, objective information collected previously*  DIAGNOSTIC FINDINGS:   None after surgery.   Prior to surgery: 05/31/2021: MR Shoulder Rt without contrast: IMPRESSION: 1. Diffuse severe supraspinatus tendinosis with full-thickness tear of the anterior tendon proximal to the insertion. 2. Severe distal infraspinatus tendinosis with small intrasubstance tear. 3. Moderate distal subscapularis tendinosis with small intrasubstance tear. 4. Severe biceps tendinosis with medial subluxation and small partial tear within the bicipital groove. 5. Severe acromioclavicular and mild glenohumeral osteoarthritis.   PATIENT SURVEYS:  FOTO 53%, predicted 67% in 15 visits   COGNITION:          Overall cognitive status: Within functional limits for tasks assessed                               SENSATION:          Light touch: Appears intact   POSTURE: Forward head, BIL rounded shoulders   UPPER EXTREMITY AROM/PROM:   A/PROM Right 09/17/2021 Left 09/17/2021 Right 09/23/2021 Right 09/25/2021 Right 09/30/21 R 3/16 R 3/23 R 3/23 R 11/18/21 L 11/18/21  Shoulder flexion 80/110p! 180 115/120p! 110/125p! 130/135p! 130  130/140 no pain 135 AROM 138 AROM  Shoulder abduction 78/103p! 180 80/90p! 75/90p! 90/105p!    130 150  Shoulder internal rotation 50/65p! 90 85p! 86     Lower thoracic reach Mid thoracic reach Reach   Shoulder external rotation 50/60p! 80 60/65p! 50/55p! 60/66p!  Full no pain in normal range  Reach T2 Reach T2  Elbow flexion 130/130 130/130          Elbow extension 0/0 0/0          (Blank rows = not tested)   UPPER EXTREMITY MMT:   MMT Right 09/17/2021 Left 09/17/2021 Right 10/23/2021 R 4/18 R 11/18/21   Shoulder flexion 3/5p! 4+/5 4/5p! 4+    Shoulder abduction 3/5p! 4+/5 5/5     Shoulder internal rotation 4/5 5/5 5/5     Shoulder external rotation 3+/5p! 5/5 4/5p! 4 p! 4p! 4+  Middle trapezius          Latissimus Dorsi          Elbow flexion 4+/5 5/5      Elbow extension 5/5 5/5      Grip strength (lbs) 42 44      (Blank rows = not tested)      JOINT MOBILITY TESTING:  Mild hypomobility with minor pain with GHJ joint mobilization AP/PA/Inf   PALPATION:  TTP to surgical incisions at Rt shoulder            TODAY'S TREATMENT:   [facility] Adult PT Treatment:                                                DATE: 12/09/2021 Therapeutic Exercise: UBE - L3 - 5'  forward Seated low rows with 40# cable 2x10 Seated high rows with 40# cable 2x10 Seated lat pull-down in comfortable elevation range - 3x10 - 40# OH flexion press - 2# - 3x10 Lateral elbow raise - 3x10 - 2# Wall push up - 3x10 Flexion slides on wall - 2x20 YTB scaption - 3x10 IR - Black TB - 3x12 Rt ER - GTB - 3x12 Rt Biceps curl 2x10 8# Stepwise Rt shoulder scaption 5# dumbbell lift to shoulder-level shelf and then to overhead shelf and then back down again 3x5  [facility] Adult PT Treatment:                                                DATE: 12/02/2021 Therapeutic Exercise: UBE - L3 - 5' forward Seated low rows with 40# cable 2x10 Seated high rows with 40# cable 2x10 Seated lat pull-down in comfortable elevation range - 3x10 - 40# OH flexion press - 2# - 3x10 Lateral elbow raise - 3x10 - 2# Wall push up - 3x10 Flexion slides on wall - 2x20 YTB scaption - 3x10 IR - Black TB - 3x12 Rt ER - GTB - 3x12 Rt Biceps curl 3x10 7# (NT) HS curl - 20# Knee ext machine - 20# Stepwise Rt shoulder scaption 4# dumbbell lift to shoulder-level shelf and then to overhead shelf and then back down again 3x5  [facility] Adult PT Treatment:                                                DATE: 11/25/2021 Therapeutic Exercise: UBE - L3 - 5' forward Seated low rows with 40# cable 2x10 Seated high rows with 40# cable 2x10 Seated lat pull-down in comfortable elevation range - 3x10 - 40# OH flexion press - 2# - 3x10 Lateral elbow raise - 3x10 - 1# Wall push up - 3x10 Flexion slides on wall - 2x10 IR - Black TB - 3x10 Rt ER - GTB - 3x10 Rt Biceps curl 3x10 7# Quad set with towel - 5'' 2x10 (R) SLR  - 3x10 (R) Stepwise Rt shoulder scaption 3# dumbbell lift to shoulder-level shelf and then to overhead shelf and then back down again 2x10 (NT)  [facility] Adult PT Treatment:                                                DATE: 11/18/2021 Therapeutic Exercise: UBE - L3 - 5' forward AROM/MMT Seated low rows with 35# cable 2x10 Seated high rows with 35# cable 2x10 Seated lat pull-down in comfortable elevation range - 3x10 - 35# ER - RTB - 3x10  bilat OH flexion press - 2# - 3x10 Seated scaption 2# 3 x 10 IR - Black TB - 3x10 Rt Biceps curl 3x10 6#    [facility] Adult PT Treatment:                                                DATE: 11/11/2021 Therapeutic Exercise: UBE -  L3 - 5' forward Seated low rows with 35# cable 2x10 Seated high rows with 35# cable 2x10 Seated lat pull-down in comfortable elevation range - 3x10 - 35# OH flexion press - 1# - 2x10 Lateral elbow raise raise - 3x15 Wall push up - 3x10 Flexion slides on wall - 2x10 IR - Black TB - 3x10 Rt ER - RTB - 3x10 Rt Biceps curl 3x10 6# Stepwise Rt shoulder scaption 3# dumbbell lift to shoulder-level shelf and then to overhead shelf and then back down again 2x10  Therapeutic Activity - collecting information for goals, checking progress, and reviewing with patient  [facility] Adult PT Treatment:                                                DATE: 11/06/2021 Therapeutic Exercise: UBE - L3 - 5' forward Seated low rows with 30# cable 2x10 Seated high rows with 30# cable 2x10 Seated lat pull-down in comfortable elevation range - 3x10 - 30# OH flexion press - 1# - 2x10 Lateral raise - 2x10 Wall push up - 3x10 Flexion slides on wall - 2x10 IR - Black TB - 3x10 Rt ER - RTB - 3x10 Rt Biceps curl 3x10 6# Stepwise Rt shoulder scaption 4# dumbbell lift to shoulder-level shelf and then to overhead shelf and then back down again 2x5  Manual Therapy (concentrating on increasing extensibility of restricted tissue to reduce discomfort and  improve mechanics in functional movement): STM L UT Lateral glide lower Cx spine G III  [facility] Adult PT Treatment:                                                DATE: 11/04/2021 Therapeutic Exercise: UBE - L3 - 5' forward Seated low rows with 30# cable 2x10 Seated high rows with 30# cable 2x10 Seated lat pull-down in comfortable elevation range - 3x10 - 30# OH flexion press - 1# - 2x10 Lateral raise - 2x10 Wall push up - 3x10 Flexion slides on wall - 2x10 IR - BTB - 3x10 Rt ER - RTB - 3x10 Rt Biceps curl 3x10 6# Farmers carry - 10# - 185' Stepwise Rt shoulder scaption 3# dumbbell lift to shoulder-level shelf and then to overhead shelf and then back down again 2x10  Manual Therapy (concentrating on increasing extensibility of restricted tissue to reduce discomfort and improve mechanics in functional movement): PROM R shoulder focusing on flexion  [facility] Adult PT Treatment:                                                DATE: 10/30/2021 Therapeutic Exercise: UBE - L3 - 5' forward Seated low rows with 30# cable 2x10 Seated high rows with 30# cable 2x10 Seated lat pull-down in comfortable elevation range - 3x10 - 30# OH flexion press - 0# - 3x10 Wall push up - 3x10 Flexion slides on wall - 2x10 IR - BTB - 3x10 Rt ER - RTB - 3x10 Rt Biceps curl 3x15 5# Farmers carry - 10# - 185' Stepwise Rt shoulder scaption 3# dumbbell lift to shoulder-level shelf and then to overhead  shelf and then back down again 2x10  Manual Therapy (concentrating on increasing extensibility of restricted tissue to reduce discomfort and improve mechanics in functional movement): PROM R shoulder focusing on flexion   [facility] Adult PT Treatment:                                                DATE: 10/28/2021 Therapeutic Exercise: UBE - L2 - 5' forward Pball roll up wall with lift off x10 Seated low rows with 25# cable 2x10 Seated high rows with 25# cable 2x10 Seated lat pull-down in comfortable elevation range -  3x10 - 25# IR - BTB - 3x10 Rt ER - RTB - 3x10 Rt Biceps curl 3x15 5# Farmers carry - 8# - 185' Stepwise Rt shoulder scaption 3# dumbbell lift to shoulder-level shelf and then to overhead shelf and then back down again 3x10  Manual Therapy (concentrating on increasing extensibility of restricted tissue to reduce discomfort and improve mechanics in functional movement): PROM R shoulder focusing on flexion Inferior GH glide      HOME EXERCISE PROGRAM: Access Code: [email or web address] URL: [email or web address] Date: 11/25/2021 Prepared by: Shearon Balo  Exercises - Shoulder Scaption AAROM with Dowel  - 2 x daily - 7 x weekly - 3 sets - 10 reps - 5-sec hold - Standing Shoulder Posterior Capsule Stretch  - 2 x daily - 7 x weekly - 3 sets - 30sec hold - Sidelying Shoulder External Rotation  - 1 x daily - 7 x weekly - 3 sets - 10 reps - Supine Quad Set  - 3 x daily - 7 x weekly - 2 sets - 10 reps - 10 hold - Active Straight Leg Raise with Quad Set  - 1 x daily - 7 x weekly - 2 sets - 10 reps      ASSESSMENT:   CLINICAL IMPRESSION: Ladye L Zehren has progressed well with therapy.  Improved impairments include: shoulder ROM, shoulder strenth.  Functional improvements include: lifting, reaching OH, ADLs involving use of shoulder.  Progressions needed include: continued work at home with HEP.  Barriers to progress include: none.  Please see GOALS section for progress on short term and long term goals established at evaluation.  I recommend D/C home with HEP; pt agrees with plan.   OBJECTIVE IMPAIRMENTS decreased ROM, decreased strength, hypomobility, impaired flexibility, impaired UE functional use, postural dysfunction, and pain.    ACTIVITY LIMITATIONS cleaning, community activity, driving, meal prep, laundry, yard work, and shopping.    PERSONAL FACTORS 1 comorbidity: HTN  are also affecting patient's functional outcome.      REHAB POTENTIAL: Good   CLINICAL DECISION MAKING:  Stable/uncomplicated   EVALUATION COMPLEXITY: Low     GOALS: Goals reviewed with patient? Yes   SHORT TERM GOALS:   STG Name Target Date Goal status  1 Pt will report understanding and adherence to her HEP in order to promote independence in the management of her primary impairments. Baseline: HEP provided at eval 10/15/2021 MET 3/23  2 Pt will achieve WNL global Rt shoulder AROM in order to get dressed with less limitation. Baseline: See AROM chart 10/15/2021 MET 3/23    LONG TERM GOALS:    LTG Name Target Date Goal status  1 Pt will achieve a FOTO score of 67% in order to demonstrate improved functional ability as it relates to her shoulder  symptoms. Baseline: 53%  3/30: 52%  4/25: 58% 11/12/2021  Extended to 01/06/2022 MET  2 Pt will achieve global Rt shoulder MMT of 4+/5 in order to do heavy yard work with less limitation. Baseline: See MMT chart  4/18: improving (see chart) 11/12/2021  Extended to 01/06/2022 MET  3 Pt will demonstrate ability to lift 5# weight into overhead cabinets with her Rt UE in order to put dishes away with less limitation. Baseline: Unable per post-op protocol  3/30: able to lift OH, but not with 5#  4/25: able to lift 3# with some min pain  5/16: 4#  5/23: 5# 11/12/2021  Extended to 01/06/2022 MET    PLAN: PT FREQUENCY: 1-2x/week   PT DURATION: 01/06/2022   PLANNED INTERVENTIONS: Therapeutic exercises, Therapeutic activity, Neuromuscular re-education, Patient/Family education, Joint mobilization, Dry Needling, Electrical stimulation, Spinal mobilization, Cryotherapy, Moist heat, Taping, Vasopneumatic device, Manual therapy, and Neuro Muscular re-education   PLAN FOR NEXT SESSION: Progress Rt shoulder AROM per post-op protocol, introduce early strengthening at appropriate intervals    Mathis Dad, PT 12/09/2021, 4:12 PM

## 2021-12-11 DIAGNOSIS — N1832 Chronic kidney disease, stage 3b: Secondary | ICD-10-CM | POA: Diagnosis not present

## 2021-12-11 DIAGNOSIS — I10 Essential (primary) hypertension: Secondary | ICD-10-CM | POA: Diagnosis not present

## 2021-12-16 ENCOUNTER — Ambulatory Visit: Payer: Medicare Other | Admitting: Physical Therapy

## 2022-01-05 ENCOUNTER — Other Ambulatory Visit: Payer: Self-pay | Admitting: Neurology

## 2022-01-19 ENCOUNTER — Emergency Department (HOSPITAL_BASED_OUTPATIENT_CLINIC_OR_DEPARTMENT_OTHER)
Admission: EM | Admit: 2022-01-19 | Discharge: 2022-01-19 | Disposition: A | Discharge status: Home / Self Care | Payer: Medicare Other | Attending: Emergency Medicine | Admitting: Emergency Medicine

## 2022-01-19 ENCOUNTER — Other Ambulatory Visit (HOSPITAL_BASED_OUTPATIENT_CLINIC_OR_DEPARTMENT_OTHER): Payer: Self-pay

## 2022-01-19 ENCOUNTER — Emergency Department (HOSPITAL_COMMUNITY): Payer: Medicare Other

## 2022-01-19 ENCOUNTER — Emergency Department (HOSPITAL_BASED_OUTPATIENT_CLINIC_OR_DEPARTMENT_OTHER): Payer: Medicare Other

## 2022-01-19 ENCOUNTER — Other Ambulatory Visit: Payer: Self-pay

## 2022-01-19 ENCOUNTER — Encounter (HOSPITAL_BASED_OUTPATIENT_CLINIC_OR_DEPARTMENT_OTHER): Payer: Self-pay | Admitting: Obstetrics and Gynecology

## 2022-01-19 DIAGNOSIS — R7989 Other specified abnormal findings of blood chemistry: Secondary | ICD-10-CM | POA: Diagnosis not present

## 2022-01-19 DIAGNOSIS — Z79899 Other long term (current) drug therapy: Secondary | ICD-10-CM | POA: Diagnosis not present

## 2022-01-19 DIAGNOSIS — I10 Essential (primary) hypertension: Secondary | ICD-10-CM | POA: Insufficient documentation

## 2022-01-19 DIAGNOSIS — R270 Ataxia, unspecified: Secondary | ICD-10-CM | POA: Diagnosis not present

## 2022-01-19 DIAGNOSIS — G2581 Restless legs syndrome: Secondary | ICD-10-CM | POA: Insufficient documentation

## 2022-01-19 DIAGNOSIS — R42 Dizziness and giddiness: Secondary | ICD-10-CM | POA: Diagnosis not present

## 2022-01-19 DIAGNOSIS — R55 Syncope and collapse: Secondary | ICD-10-CM | POA: Insufficient documentation

## 2022-01-19 DIAGNOSIS — R519 Headache, unspecified: Secondary | ICD-10-CM | POA: Diagnosis not present

## 2022-01-19 DIAGNOSIS — R29818 Other symptoms and signs involving the nervous system: Secondary | ICD-10-CM | POA: Diagnosis not present

## 2022-01-19 DIAGNOSIS — E785 Hyperlipidemia, unspecified: Secondary | ICD-10-CM | POA: Diagnosis not present

## 2022-01-19 DIAGNOSIS — I6782 Cerebral ischemia: Secondary | ICD-10-CM | POA: Insufficient documentation

## 2022-01-19 LAB — COMPREHENSIVE METABOLIC PANEL
ALT: 12 U/L (ref 0–44)
AST: 14 U/L — ABNORMAL LOW (ref 15–41)
Albumin: 4.5 g/dL (ref 3.5–5.0)
Alkaline Phosphatase: 61 U/L (ref 38–126)
Anion gap: 10 (ref 5–15)
BUN: 22 mg/dL (ref 8–23)
CO2: 27 mmol/L (ref 22–32)
Calcium: 9.9 mg/dL (ref 8.9–10.3)
Chloride: 102 mmol/L (ref 98–111)
Creatinine, Ser: 1.59 mg/dL — ABNORMAL HIGH (ref 0.44–1.00)
GFR, Estimated: 33 mL/min — ABNORMAL LOW (ref >60)
Glucose, Bld: 86 mg/dL (ref 70–99)
Potassium: 3.9 mmol/L (ref 3.5–5.1)
Sodium: 139 mmol/L (ref 135–145)
Total Bilirubin: 0.5 mg/dL (ref 0.3–1.2)
Total Protein: 7.9 g/dL (ref 6.5–8.1)

## 2022-01-19 LAB — DIFFERENTIAL
Abs Immature Granulocytes: 0.02 10*3/uL (ref 0.00–0.07)
Basophils Absolute: 0.1 10*3/uL (ref 0.0–0.1)
Basophils Relative: 1 %
Eosinophils Absolute: 0.1 10*3/uL (ref 0.0–0.5)
Eosinophils Relative: 1 %
Immature Granulocytes: 0 %
Lymphocytes Relative: 40 %
Lymphs Abs: 3.7 10*3/uL (ref 0.7–4.0)
Monocytes Absolute: 0.6 10*3/uL (ref 0.1–1.0)
Monocytes Relative: 6 %
Neutro Abs: 4.7 10*3/uL (ref 1.7–7.7)
Neutrophils Relative %: 52 %

## 2022-01-19 LAB — CBC
HCT: 41 % (ref 36.0–46.0)
Hemoglobin: 13.4 g/dL (ref 12.0–15.0)
MCH: 29.1 pg (ref 26.0–34.0)
MCHC: 32.7 g/dL (ref 30.0–36.0)
MCV: 89.1 fL (ref 80.0–100.0)
Platelets: 294 10*3/uL (ref 150–400)
RBC: 4.6 MIL/uL (ref 3.87–5.11)
RDW: 13.2 % (ref 11.5–15.5)
WBC: 9.1 10*3/uL (ref 4.0–10.5)
nRBC: 0 % (ref 0.0–0.2)

## 2022-01-19 LAB — PROTIME-INR
INR: 1 (ref 0.8–1.2)
Prothrombin Time: 13.3 s (ref 11.4–15.2)

## 2022-01-19 LAB — ETHANOL: Alcohol, Ethyl (B): <10 mg/dL

## 2022-01-19 LAB — TROPONIN I (HIGH SENSITIVITY): Troponin I (High Sensitivity): 7 ng/L

## 2022-01-19 LAB — CBG MONITORING, ED: Glucose-Capillary: 82 mg/dL (ref 70–99)

## 2022-01-19 LAB — APTT: aPTT: 33 seconds (ref 24–36)

## 2022-01-19 MED ORDER — AMOXICILLIN-POT CLAVULANATE 875-125 MG PO TABS
1.0000 | ORAL_TABLET | Freq: Once | ORAL | Status: AC
  Administered 2022-01-19: 1 via ORAL
  Filled 2022-01-19: qty 1

## 2022-01-19 MED ORDER — AMOXICILLIN-POT CLAVULANATE 875-125 MG PO TABS
1.0000 | ORAL_TABLET | Freq: Two times a day (BID) | ORAL | 0 refills | Status: DC
  Filled 2022-01-19: qty 14, 7d supply, fill #0

## 2022-01-19 MED ORDER — LORAZEPAM 1 MG PO TABS
1.0000 mg | ORAL_TABLET | ORAL | Status: DC | PRN
  Administered 2022-01-19: 1 mg via ORAL
  Filled 2022-01-19: qty 1

## 2022-01-19 MED ORDER — SODIUM CHLORIDE 0.9% FLUSH
3.0000 mL | Freq: Once | INTRAVENOUS | Status: AC
  Administered 2022-01-19: 3 mL via INTRAVENOUS
  Filled 2022-01-19: qty 3

## 2022-01-19 NOTE — Discharge Instructions (Addendum)
Your MRI did not show any evidence of stroke.   Your Augmentin prescription has been sent for the suspected ear infection.  Please follow-up with your primary care doctor in 3 days for reevaluation as planned.

## 2022-01-19 NOTE — ED Notes (Signed)
Patient transported to CT 

## 2022-01-19 NOTE — ED Notes (Signed)
Patient PIV secured for transfer

## 2022-01-19 NOTE — ED Triage Notes (Signed)
Patient reports she went to bed last night around 11pm and felt normal but woke up at 0300 to pee and was off balance. Patient reports she went back to sleep and woke up and was still off balance. Patient reports she has a slight headache and a left sided ear pain with sore throat as well. Patient denies feeling as if the room is spinning, denies nausea or emesis. Patient reports no recent falls.

## 2022-01-19 NOTE — ED Provider Notes (Signed)
[facility] EMERGENCY DEPARTMENT Provider Note   CSN: [id number] Arrival date & time: 01/19/22  0945     History  Chief Complaint  Patient presents with   Headache   Near Syncope    Melissa Maldonado is a 81 y.o. female.  HPI     81 year old female comes in with chief complaint of headaches, dizziness, near fainting.  Patient states that she woke up at 3 AM, noted that she was dizzy and off-balance.  She went to sleep, but when she woke up this morning she continues to feel off balance and decided to come to the ER.  Patient describes her dizziness as off-balance.  There is no spinning sensation.  She denies any associated nausea, posterior headaches, double vision, slurred speech, difficulty in swallowing, focal weakness or numbness.  Patient had a slight frontal headache when she had first woke up, but that headache has now resolved.  Of note, she indicates that few days back she had some neck discomfort under her ear and then some ear ache and pressure, however there is no tinnitus or drainage.  At the moment, she does not have severe ear pain.  Patient has history of hypertension, hyperlipidemia, restless leg syndrome.  Home Medications Prior to Admission medications   Medication Sig Start Date End Date Taking? Authorizing Provider  amoxicillin-clavulanate (AUGMENTIN) 875-125 MG tablet Take 1 tablet by mouth every 12 (twelve) hours. 01/19/22  Yes Jodye Scali, MD  ondansetron (ZOFRAN) 4 MG tablet Take 1 tablet (4 mg total) by mouth every 8 (eight) hours as needed for nausea or vomiting. Patient not taking: Reported on 09/17/2021 08/21/21   Aundra Dubin, PA-C  Acetaminophen (TYLENOL 8 HOUR ARTHRITIS PAIN PO) Take 2-4 tablets by mouth daily.    [provider]  amlodipine-atorvastatin (CADUET) 2.5-20 MG tablet Take 1 tablet by mouth daily.    [provider]  cholecalciferol (VITAMIN D3) 25 MCG (1000 UNIT) tablet Take 1,000 Units by mouth  daily.    [provider]  DULoxetine (CYMBALTA) 60 MG capsule Take 1 capsule (60 mg total) by mouth daily. Must have follow up for future refills Patient not taking: Reported on 09/17/2021 02/24/21   Marcial Pacas, MD  gabapentin (NEURONTIN) 300 MG capsule Take 1 capsule (300 mg total) by mouth 3 (three) times daily. Patient not taking: Reported on 09/17/2021 01/15/21   Marcial Pacas, MD  HYDROcodone-acetaminophen [facility]) 5-325 MG tablet Take 1-2 tablets by mouth 3 (three) times daily as needed. To be taken after surgery Patient not taking: Reported on 09/17/2021 08/28/21   Leandrew Koyanagi, MD  lidocaine-prilocaine (EMLA) cream Apply 1 application topically as needed. Apply over the area 60 minutes prior to the injection and cover with plastic wrap. Patient not taking: Reported on 09/17/2021 06/03/21   [name], MD  predniSONE (STERAPRED UNI-PAK 21 TAB) 5 MG (21) TBPK tablet Take as directed 10/09/21   Aundra Dubin, PA-C  rOPINIRole (REQUIP) 0.5 MG tablet Take 0.5 mg by mouth at bedtime.    [provider]  telmisartan-hydrochlorothiazide (MICARDIS HCT) 40-12.5 MG tablet Take 1 tablet by mouth daily. Patient not taking: Reported on 09/17/2021 02/14/20   [provider]  traMADol (ULTRAM) 50 MG tablet Take 1 tablet (50 mg total) by mouth every 12 (twelve) hours as needed. 09/05/21   Aundra Dubin, PA-C      Allergies    Aspirin, Vicks vaporub [liniments], and Codeine    Review of Systems  Review of Systems  All other systems reviewed and are negative.   Physical Exam Updated Vital Signs BP (!) 150/71   Pulse (!) 50   Temp 98.4 F (36.9 C) (Oral)   Resp 16   Ht '5\' 5"'$  (1.651 m)   Wt 89.8 kg   SpO2 100%   BMI 32.95 kg/m  Physical Exam Vitals and nursing note reviewed.  Constitutional:      Appearance: She is well-developed.  HENT:     Head: Atraumatic.     Ears:     Comments: Patient has suppurative tympanic membrane on the left side without any debris in the ear  canal Eyes:     General: No visual field deficit.    Extraocular Movements: Extraocular movements intact.     Pupils: Pupils are equal, round, and reactive to light.     Comments: No nystagmus, normal visual field exam and gross bedside finger counting is normal  Neck:     Comments: No meningismus Cardiovascular:     Rate and Rhythm: Normal rate.  Pulmonary:     Effort: Pulmonary effort is normal.  Musculoskeletal:     Cervical back: Normal range of motion and neck supple.  Skin:    General: Skin is warm and dry.  Neurological:     Mental Status: She is alert and oriented to person, place, and time.     GCS: GCS eye subscore is 4. GCS verbal subscore is 5. GCS motor subscore is 6.     Cranial Nerves: No cranial nerve deficit, dysarthria or facial asymmetry.     Sensory: No sensory deficit.     Motor: No weakness.     Gait: Gait abnormal.     Comments: Patient has ataxic gait upon ambulation, it appears that she is favoring the right side more     ED Results / Procedures / Treatments   Labs (all labs ordered are listed, but only abnormal results are displayed) Labs Reviewed  COMPREHENSIVE METABOLIC PANEL - Abnormal; Notable for the following components:      Result Value   Creatinine, Ser 1.59 (*)    AST 14 (*)    GFR, Estimated 33 (*)    All other components within normal limits  PROTIME-INR  APTT  CBC  DIFFERENTIAL  ETHANOL  CBG MONITORING, ED  TROPONIN I (HIGH SENSITIVITY)  TROPONIN I (HIGH SENSITIVITY)    EKG EKG Interpretation  Date/Time:  Monday January 19 2022 09:57:10 EDT Ventricular Rate:  55 PR Interval:  215 QRS Duration: 95 QT Interval:  449 QTC Calculation: 430 R Axis:   -21 Text Interpretation: Sinus rhythm Borderline prolonged PR interval Borderline left axis deviation Low voltage, precordial leads ST elevation, consider inferior injury Confirmed by Varney Biles [phone]) on 01/19/2022 11:21:17 AM  Radiology CT HEAD WO CONTRAST  Result Date:  01/19/2022 CLINICAL DATA:  Neuro deficit, acute. Loss of balance since earlier this morning, dizziness, slight headache. EXAM: CT HEAD WITHOUT CONTRAST TECHNIQUE: Contiguous axial images were obtained from the base of the skull through the vertex without intravenous contrast. RADIATION DOSE REDUCTION: This exam was performed according to the departmental dose-optimization program which includes automated exposure control, adjustment of the mA and/or kV according to patient size and/or use of iterative reconstruction technique. COMPARISON:  None Available. FINDINGS: Brain: No hydrocephalus. Mild chronic small vessel ischemic changes within the bilateral periventricular and subcortical white matter regions. No mass, hemorrhage, edema or other evidence of acute parenchymal abnormality. No extra-axial hemorrhage. Vascular: Chronic  calcified atherosclerotic changes of the large vessels at the skull base. No unexpected hyperdense vessel. Skull: Normal. Negative for fracture or focal lesion. Sinuses/Orbits: Visualized upper paranasal sinuses are clear. Visualized upper periorbital and retro-orbital soft tissues are unremarkable. Other: None. IMPRESSION: 1. No acute findings. No intracranial mass, hemorrhage or edema. 2. Mild chronic small vessel ischemic changes in the white matter. Electronically Signed   By: Franki Cabot M.D.   On: 01/19/2022 10:19    Procedures Procedures    Medications Ordered in ED Medications  sodium chloride flush (NS) 0.9 % injection 3 mL (3 mLs Intravenous Given 01/19/22 1120)  amoxicillin-clavulanate (AUGMENTIN) 875-125 MG per tablet 1 tablet (1 tablet Oral Given 01/19/22 1125)    ED Course/ Medical Decision Making/ A&P                           Medical Decision Making Amount and/or Complexity of Data Reviewed Labs: ordered. Radiology: ordered.  Risk Prescription drug management.   This patient presents to the ED with chief complaint(s) of unsteady gait with pertinent past  medical history of hypertension and hyperlipidemia and recent episode of anterior neck pain and ear discomfort which further complicates the presenting complaint. The complaint involves an extensive differential diagnosis and also carries with it a high risk of complications and morbidity.    Patient has a normal neurologic exam.  No nystagmus.  She does not have any diplopia, dysarthria or dysphagia.  There is no vertiginous symptoms.  However, when patient was ambulated by me, she clearly was ataxic.  There is no history of heavy alcohol consumption or substance use disorder.  Patient's left ear did appear to have increased pressure/fluid and perhaps purulence as well.  She indicates slight hearing loss on the left side.  The differential diagnosis includes cerebellar stroke, basilar insufficiency, severe electrolyte abnormality, orthostatic hypotension, otitis media.  The initial plan is to get CT scan of the brain along with basic blood work.   Additional history obtained: Additional history obtained from spouse Records reviewed Primary Care Documents  Independent labs interpretation:  The following labs were independently interpreted: Normal-appearing CBC and metabolic profile.  Patient's troponin which was sent from triage is also normal.  Creatinine is slightly elevated.  Independent visualization of imaging: - I independently visualized the following imaging with scope of interpretation limited to determining acute life threatening conditions related to emergency care: CT scan of the brain, which revealed no evidence of acute brain bleed  Treatment and Reassessment: Patient reassessed after the CT scan.  She still feels ataxic when the walker.  Discussed with her that ataxia is not a classic presentation of ear infection, therefore we would like to rule out a stroke.  Patient in agreement with the plan and will go to [facility] emergency room for MRI of the brain and MRA of the brain.   Dr. Francia Greaves at: Made aware.  I have sent her Augmentin prescription for AOM to outside pharmacy.  Patient will follow-up with her PCP this Wednesday.  She also has an ENT doctor, but was unable to get in promptly.  Advised PCP evaluation for her ear symptoms if the MRI is negative and the antibiotic is not helping.  Final Clinical Impression(s) / ED Diagnoses Final diagnoses:  Ataxia    Rx / DC Orders ED Discharge Orders          Ordered    amoxicillin-clavulanate (AUGMENTIN) 875-125 MG tablet  Every 12  hours        01/19/22 [place], Warrene Kapfer, MD 01/19/22 1239

## 2022-01-19 NOTE — ED Notes (Signed)
Patient to transfer to [facility] ED for MRI.  OK to go by POV with husband per Dr. Kathrynn Humble.  Patient given instructions to go directly to [facility] ER and check in at the triage desk

## 2022-01-19 NOTE — ED Provider Notes (Signed)
Physical Exam  BP 135/64 (BP Location: Left Arm)   Pulse (!) 44   Temp 98.7 F (37.1 C) (Oral)   Resp 17   Ht '5\' 5"'$  (1.651 m)   Wt 89.8 kg   SpO2 99%   BMI 32.95 kg/m   Physical Exam Vitals and nursing note reviewed.  Constitutional:      Appearance: Normal appearance.  HENT:     Head: Normocephalic and atraumatic.  Eyes:     Conjunctiva/sclera: Conjunctivae normal.  Pulmonary:     Effort: Pulmonary effort is normal. No respiratory distress.  Skin:    General: Skin is warm and dry.  Neurological:     Mental Status: She is alert.  Psychiatric:        Mood and Affect: Mood normal.        Behavior: Behavior normal.   Imaging   MR ANGIO HEAD WO CONTRAST  Result Date: 01/19/2022 CLINICAL DATA:  There deficit, acute, stroke suspected. EXAM: MRI HEAD WITHOUT CONTRAST MRA HEAD WITHOUT CONTRAST TECHNIQUE: Multiplanar, multi-echo pulse sequences of the brain and surrounding structures were acquired without intravenous contrast. Angiographic images of the Circle of Willis were acquired using MRA technique without intravenous contrast. COMPARISON:  Head CT same day FINDINGS: MRI HEAD FINDINGS Brain: Diffusion imaging does not show any acute or subacute infarction. No focal abnormality affects the brainstem or cerebellum. Cerebral hemispheres show mild age related volume loss with mild chronic small-vessel change of the white matter. No large vessel territory infarction. No mass lesion, hemorrhage, hydrocephalus or extra-axial collection. Vascular: Major vessels at the base of the brain show flow. Skull and upper cervical spine: Negative Sinuses/Orbits: Paranasal sinuses are clear.  Orbits negative. Other: Tiny amount of fluid in the mastoid air cells on the right, probably subclinical. MRA HEAD FINDINGS Both internal carotid arteries are widely patent into the brain. No siphon stenosis. The anterior and middle cerebral vessels are patent without proximal stenosis, aneurysm or vascular  malformation. Both vertebral arteries are widely patent to the basilar. No basilar stenosis. Posterior circulation branch vessels appear normal. IMPRESSION: MRI head: No acute finding. Mild chronic small-vessel ischemic change of the hemispheric white matter. MRA head: Normal exam of the large and medium sized vessels. Electronically Signed   By: Nelson Chimes M.D.   On: 01/19/2022 17:45   MR BRAIN WO CONTRAST  Result Date: 01/19/2022 CLINICAL DATA:  There deficit, acute, stroke suspected. EXAM: MRI HEAD WITHOUT CONTRAST MRA HEAD WITHOUT CONTRAST TECHNIQUE: Multiplanar, multi-echo pulse sequences of the brain and surrounding structures were acquired without intravenous contrast. Angiographic images of the Circle of Willis were acquired using MRA technique without intravenous contrast. COMPARISON:  Head CT same day FINDINGS: MRI HEAD FINDINGS Brain: Diffusion imaging does not show any acute or subacute infarction. No focal abnormality affects the brainstem or cerebellum. Cerebral hemispheres show mild age related volume loss with mild chronic small-vessel change of the white matter. No large vessel territory infarction. No mass lesion, hemorrhage, hydrocephalus or extra-axial collection. Vascular: Major vessels at the base of the brain show flow. Skull and upper cervical spine: Negative Sinuses/Orbits: Paranasal sinuses are clear.  Orbits negative. Other: Tiny amount of fluid in the mastoid air cells on the right, probably subclinical. MRA HEAD FINDINGS Both internal carotid arteries are widely patent into the brain. No siphon stenosis. The anterior and middle cerebral vessels are patent without proximal stenosis, aneurysm or vascular malformation. Both vertebral arteries are widely patent to the basilar. No basilar stenosis. Posterior circulation branch  vessels appear normal. IMPRESSION: MRI head: No acute finding. Mild chronic small-vessel ischemic change of the hemispheric white matter. MRA head: Normal exam of  the large and medium sized vessels. Electronically Signed   By: Nelson Chimes M.D.   On: 01/19/2022 17:45   ED Course / MDM    Medical Decision Making Amount and/or Complexity of Data Reviewed Labs: ordered. Radiology: ordered.  Risk Prescription drug management.  Patient seen at [facility] ED for ataxia starting early this morning.  Work-up previous provider with concern for ear infection versus stroke rule out.  Patient initially had CT scan with no evidence of acute brain bleed.  Patient was sent to our department for an MRI and MRA of the brain.  Plan is if imaging is negative, patient's to follow-up both with ENT and PCP.  6:10pm MRI reviewed by radiology that showed no evidence of acute stroke, or large vessel occlusion.  Discussed the results with the patient.  Patient has already picked up the antibiotic that she was prescribed from the previous provider.  I recommend she take this as prescribed, and follow-up with her PCP at her scheduled appointment in 2 days.  Patient is agreeable to the plan, and all questions answered.  Portions of this report may have been transcribed using voice recognition software. Every effort was made to ensure accuracy; however, inadvertent computerized transcription errors may be present.   Kateri Plummer, PA-C 01/19/22 [place], [place], DO 01/19/22 1909

## 2022-01-21 DIAGNOSIS — H6592 Unspecified nonsuppurative otitis media, left ear: Secondary | ICD-10-CM | POA: Diagnosis not present

## 2022-02-24 DIAGNOSIS — H9202 Otalgia, left ear: Secondary | ICD-10-CM | POA: Diagnosis not present

## 2022-02-24 DIAGNOSIS — M26622 Arthralgia of left temporomandibular joint: Secondary | ICD-10-CM | POA: Diagnosis not present

## 2022-02-24 DIAGNOSIS — H9193 Unspecified hearing loss, bilateral: Secondary | ICD-10-CM | POA: Diagnosis not present

## 2022-03-02 DIAGNOSIS — N184 Chronic kidney disease, stage 4 (severe): Secondary | ICD-10-CM | POA: Diagnosis not present

## 2022-03-06 DIAGNOSIS — Z1231 Encounter for screening mammogram for malignant neoplasm of breast: Secondary | ICD-10-CM | POA: Diagnosis not present

## 2022-03-11 DIAGNOSIS — N1832 Chronic kidney disease, stage 3b: Secondary | ICD-10-CM | POA: Diagnosis not present

## 2022-03-11 DIAGNOSIS — D631 Anemia in chronic kidney disease: Secondary | ICD-10-CM | POA: Diagnosis not present

## 2022-03-11 DIAGNOSIS — N2581 Secondary hyperparathyroidism of renal origin: Secondary | ICD-10-CM | POA: Diagnosis not present

## 2022-03-11 DIAGNOSIS — I129 Hypertensive chronic kidney disease with stage 1 through stage 4 chronic kidney disease, or unspecified chronic kidney disease: Secondary | ICD-10-CM | POA: Diagnosis not present

## 2022-03-11 DIAGNOSIS — N2889 Other specified disorders of kidney and ureter: Secondary | ICD-10-CM | POA: Diagnosis not present

## 2022-04-09 ENCOUNTER — Ambulatory Visit (INDEPENDENT_AMBULATORY_CARE_PROVIDER_SITE_OTHER): Payer: Medicare Other

## 2022-04-09 ENCOUNTER — Ambulatory Visit (INDEPENDENT_AMBULATORY_CARE_PROVIDER_SITE_OTHER): Payer: Medicare Other | Admitting: Podiatry

## 2022-04-09 DIAGNOSIS — M779 Enthesopathy, unspecified: Secondary | ICD-10-CM | POA: Diagnosis not present

## 2022-04-09 DIAGNOSIS — M7752 Other enthesopathy of left foot: Secondary | ICD-10-CM

## 2022-04-09 DIAGNOSIS — M7751 Other enthesopathy of right foot: Secondary | ICD-10-CM | POA: Diagnosis not present

## 2022-04-09 MED ORDER — TRIAMCINOLONE ACETONIDE 10 MG/ML IJ SUSP
20.0000 mg | Freq: Once | INTRAMUSCULAR | Status: AC
  Administered 2022-04-09: 20 mg

## 2022-04-09 NOTE — Progress Notes (Signed)
Subjective:   Patient ID: Melissa Maldonado, female   DOB: 81 y.o.   MRN: [id number]   HPI Patient states that she has developed a lot of pain in both her ankles over the last several weeks and is not sure what may have happened.  States they are both sore with right being worse than the left   ROS      Objective:  Physical Exam  Neurovascular status intact with inflammation pain of the sinus tarsi bilateral with fluid buildup and pain with palpation     Assessment:  Inflammatory capsulitis sinus tarsi bilateral with inflammation fluid buildup     Plan:  H&P reviewed condition sterile prep and injected the sinus tarsi bilateral 3 mg Kenalog 5 mg Xylocaine applied sterile dressing and reviewed x-rays.  If symptoms persist will be seen back  X-rays do indicate mild arthritis did not indicate anything of a significant nature currently

## 2022-04-16 DIAGNOSIS — H903 Sensorineural hearing loss, bilateral: Secondary | ICD-10-CM | POA: Diagnosis not present

## 2022-04-18 DIAGNOSIS — N1832 Chronic kidney disease, stage 3b: Secondary | ICD-10-CM | POA: Diagnosis not present

## 2022-04-18 DIAGNOSIS — M159 Polyosteoarthritis, unspecified: Secondary | ICD-10-CM | POA: Diagnosis not present

## 2022-04-18 DIAGNOSIS — E7800 Pure hypercholesterolemia, unspecified: Secondary | ICD-10-CM | POA: Diagnosis not present

## 2022-04-18 DIAGNOSIS — I10 Essential (primary) hypertension: Secondary | ICD-10-CM | POA: Diagnosis not present

## 2022-04-30 DIAGNOSIS — Z0000 Encounter for general adult medical examination without abnormal findings: Secondary | ICD-10-CM | POA: Diagnosis not present

## 2022-04-30 DIAGNOSIS — E559 Vitamin D deficiency, unspecified: Secondary | ICD-10-CM | POA: Diagnosis not present

## 2022-04-30 DIAGNOSIS — E7800 Pure hypercholesterolemia, unspecified: Secondary | ICD-10-CM | POA: Diagnosis not present

## 2022-05-07 DIAGNOSIS — Z0000 Encounter for general adult medical examination without abnormal findings: Secondary | ICD-10-CM | POA: Diagnosis not present

## 2022-05-07 DIAGNOSIS — I10 Essential (primary) hypertension: Secondary | ICD-10-CM | POA: Diagnosis not present

## 2022-05-07 DIAGNOSIS — Z23 Encounter for immunization: Secondary | ICD-10-CM | POA: Diagnosis not present

## 2022-05-07 DIAGNOSIS — Z01419 Encounter for gynecological examination (general) (routine) without abnormal findings: Secondary | ICD-10-CM | POA: Diagnosis not present

## 2022-05-07 DIAGNOSIS — Z1212 Encounter for screening for malignant neoplasm of rectum: Secondary | ICD-10-CM | POA: Diagnosis not present

## 2022-05-07 DIAGNOSIS — E559 Vitamin D deficiency, unspecified: Secondary | ICD-10-CM | POA: Diagnosis not present

## 2022-05-07 DIAGNOSIS — N1832 Chronic kidney disease, stage 3b: Secondary | ICD-10-CM | POA: Diagnosis not present

## 2022-05-11 NOTE — Progress Notes (Signed)
I, Melissa Maldonado, LAT, ATC acting as a scribe for Melissa Leader, MD.  Melissa Maldonado is a 81 y.o. female who presents to [place] at [facility] today for cont'd bilat knee pain. She has a hx of a L knee partial knee replacement performed by [name]. Pt completed the Gelsyn series, 3/3, in her R knee on 06/25/21. Pt was last seen by [name] on 12/02/21 and was given a R knee steroid injection and XR's were updated. Today, pt reports R knee is very painful w/ pain returning and worsening over the last 2 weeks. Pt also c/o severe pain in her L knee.  She has a history of a partial knee replacement left knee performed by [name].  Dx imaging: 12/02/20 R & L knee XR             04/27/14 L knee XR  Pertinent review of systems: No fevers or chills  Relevant historical information: Hypertension.  Left partial knee replacement.   Exam:  BP (!) 140/76   Pulse (!) 49   Ht '5\' 5"'$  (1.651 m)   Wt 199 lb (90.3 kg)   SpO2 98%   BMI 33.12 kg/m  General: Well Developed, well nourished, and in no acute distress.   MSK: Right knee mild effusion normal-appearing otherwise.  Normal motion.  Crepitations with extension.  Tender palpation medial joint line.  Left knee: Mature scar anterior knee normal motion with crepitation.  Tender palpation medial and lateral joint line.    Lab and Radiology Results  Procedure: Real-time Ultrasound Guided Injection of right knee superior lateral patellar space Device: Philips Affiniti 50G Images permanently stored and available for review in PACS Verbal informed consent obtained.  Discussed risks and benefits of procedure. Warned about infection, bleeding, hyperglycemia damage to structures among others. Patient expresses understanding and agreement Time-out conducted.   Noted no overlying erythema, induration, or other signs of local infection.   Skin prepped in a sterile fashion.   Local anesthesia: Topical Ethyl chloride.   With  sterile technique and under real time ultrasound guidance: 40 mg of Kenalog and 2 mL of Marcaine injected into knee joint. Fluid seen entering the joint capsule.   Completed without difficulty   Pain immediately resolved suggesting accurate placement of the medication.   Advised to call if fevers/chills, erythema, induration, drainage, or persistent bleeding.   Images permanently stored and available for review in the ultrasound unit.  Impression: Technically successful ultrasound guided injection.  EXAM: RIGHT KNEE 3 VIEWS   COMPARISON:  X-ray knee 12/02/2020.   FINDINGS: There is no acute fracture or dislocation. The bones are osteopenic. There is osteoarthritic changes with tricompartmental narrowing. There is severe narrowing of the medial compartment similar or minimally progressed since the prior radiograph. There is a small suprapatellar effusion. The soft tissues are unremarkable.   IMPRESSION: 1. No acute fracture or dislocation. 2. Advanced osteoarthritis.     Electronically Signed   By: Anner Crete M.D.   On: 12/03/2021 19:18      EXAM: LEFT KNEE 3 VIEWS   COMPARISON:  X-ray knee 04/27/2014.   FINDINGS: Partial joint replacement is noted medially. Lateral osteophytic change and patellofemoral degenerative changes noted. No acute effusion is seen. No fracture is noted.   IMPRESSION: Degenerative change without acute abnormality.     Electronically Signed   By: Inez Catalina M.D.   On: 12/03/2021 19:18   I, Melissa Maldonado, personally (independently) visualized and performed the  interpretation of the images attached in this note.      Assessment and Plan: 81 y.o. female with bilateral knee pain.  Right knee pain is predominantly due to medial compartment DJD.  Steroid injections have been generally effective.  Last injection was in May 2023.  Plan for repeat steroid injection today.  Prior hyaluronic acid injection she did not think was very helpful so  we will avoid that in the future.  Ultimately she will likely need a knee replacement.  Left knee pain is more challenging.  She has a history of a partial knee replacement and is now having more chronic knee pain.  I am hopeful that if we take some of the pressure and pain away from her right knee she will have less left knee pain as a result.  If this is not successful I think her next best step is probably a consultation with orthopedic surgery regarding her options for her left knee.  Think the most likely neck step is going to be a total knee replacement left knee.   PDMP not reviewed this encounter. Orders Placed This Encounter  Procedures   [place] LIMITED JOINT SPACE STRUCTURES LOW BILAT(NO LINKED CHARGES)    Order Specific Question:   Reason for Exam (SYMPTOM  OR DIAGNOSIS REQUIRED)    Answer:   bilateral knee pain    Order Specific Question:   Preferred imaging location?    Answer:   [facility]   No orders of the defined types were placed in this encounter.    Discussed warning signs or symptoms. Please see discharge instructions. Patient expresses understanding.   The above documentation has been reviewed and is accurate and complete Melissa Maldonado, M.D.

## 2022-05-12 ENCOUNTER — Ambulatory Visit: Payer: Self-pay

## 2022-05-12 ENCOUNTER — Ambulatory Visit (INDEPENDENT_AMBULATORY_CARE_PROVIDER_SITE_OTHER): Payer: Medicare Other | Admitting: Family Medicine

## 2022-05-12 VITALS — BP 140/76 | HR 49 | Ht 65.0 in | Wt 199.0 lb

## 2022-05-12 DIAGNOSIS — M25562 Pain in left knee: Secondary | ICD-10-CM | POA: Diagnosis not present

## 2022-05-12 DIAGNOSIS — G8929 Other chronic pain: Secondary | ICD-10-CM | POA: Diagnosis not present

## 2022-05-12 DIAGNOSIS — M25561 Pain in right knee: Secondary | ICD-10-CM | POA: Diagnosis not present

## 2022-05-12 DIAGNOSIS — M1711 Unilateral primary osteoarthritis, right knee: Secondary | ICD-10-CM | POA: Diagnosis not present

## 2022-05-12 NOTE — Patient Instructions (Addendum)
Thank you for coming in today.   You received an injection today. Seek immediate medical attention if the joint becomes red, extremely painful, or is oozing fluid.   Let me know if you left knee is still bothering you and I can refer you to orthopedic surgery.  Check back as needed

## 2022-05-14 DIAGNOSIS — H401220 Low-tension glaucoma, left eye, stage unspecified: Secondary | ICD-10-CM | POA: Diagnosis not present

## 2022-05-14 DIAGNOSIS — H40011 Open angle with borderline findings, low risk, right eye: Secondary | ICD-10-CM | POA: Diagnosis not present

## 2022-05-14 DIAGNOSIS — H26493 Other secondary cataract, bilateral: Secondary | ICD-10-CM | POA: Diagnosis not present

## 2022-05-14 DIAGNOSIS — H18513 Endothelial corneal dystrophy, bilateral: Secondary | ICD-10-CM | POA: Diagnosis not present

## 2022-05-18 IMAGING — MR MR ABDOMEN WO/W CM
17 series · 48 of 48 positions shown · IV contrast (multihance)
Comparison: CT abdomen 07/07/2021

CLINICAL DATA: Evaluate possible renal lesion

EXAM:
MRI ABDOMEN WITHOUT AND WITH CONTRAST
TECHNIQUE: Multiplanar multisequence MR imaging of the abdomen was performed
both before and after the administration of intravenous contrast.
CONTRAST:  18mL MULTIHANCE GADOBENATE DIMEGLUMINE 529 MG/ML IV SOLN

[Series 3: T2 · coronal · 5.0mm · 0.78mm/px · 1 of 19 slices shown (1 of 3)]
[im 1/19]
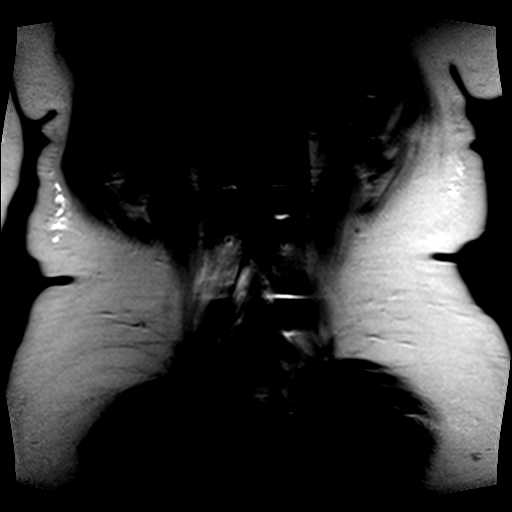

[Series 4: T2 · axial · 5.0mm · 0.66mm/px · 1 of 25 slices shown (2 of 3)]
[im 1/25]
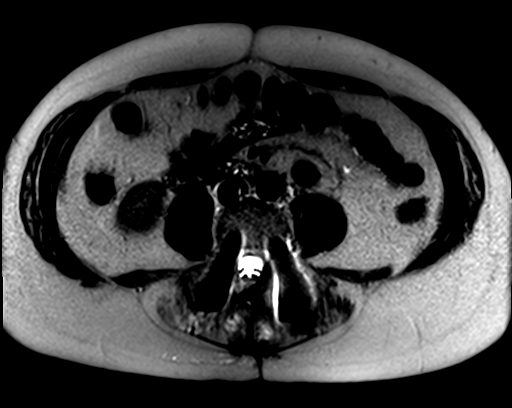

[Series 5: T1 · axial · 5.0mm · 0.66mm/px · z∈[-30,+120]mm · 2 of 52 slices shown]
[im 1/52]
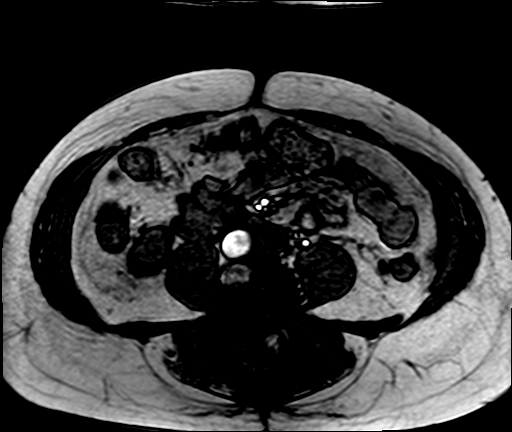
[im 52/52]
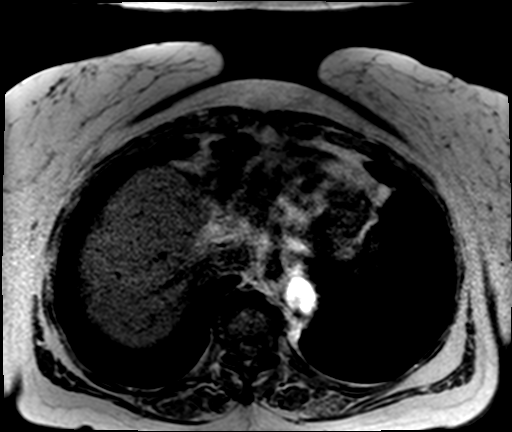

[Series 6: bSSFP · axial · 4.0mm · 0.74mm/px · 1 of 38 slices shown]
[im 1/38]
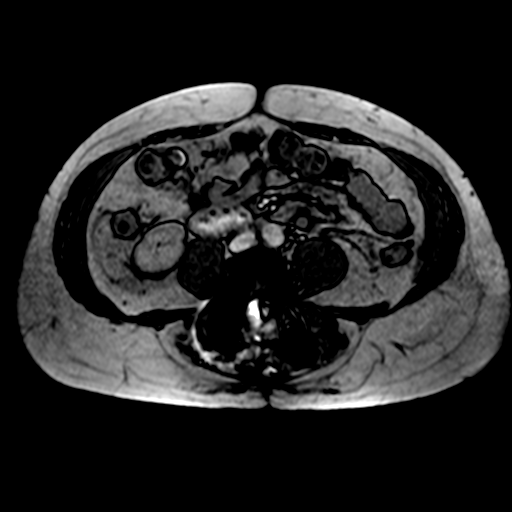

[Series 7: ep2d_diff_b50_500_800_p2_trig · axial · 6.0mm · 1.98mm/px · z∈[-28,+128]mm · 2 of 61 slices shown]
[im 1/61]
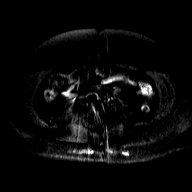
[im 61/61]
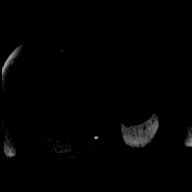

[Series 8: ep2d_diff_b50_500_800_p2_trig_adc · axial · 6.0mm · 1.98mm/px · 1 of 21 slices shown]
[im 1/21]
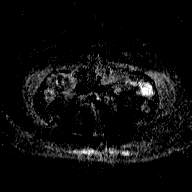

[Series 9: T2 · axial · 5.0mm · 1.33mm/px · 1 of 25 slices shown (3 of 3)]
[im 1/25]
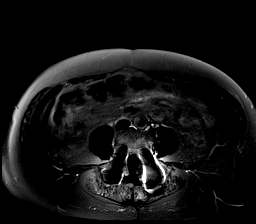

[Series 10: T1 dynamic · axial · non-contrast · 2.3mm · 1.41mm/px · z∈[-29,+116]mm · 4 of 64 slices shown]
[im 1/64]
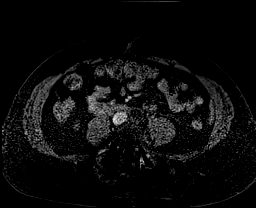
[im 22/64]
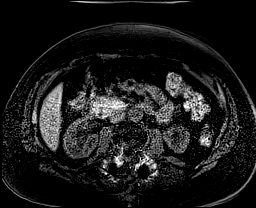
[im 43/64]
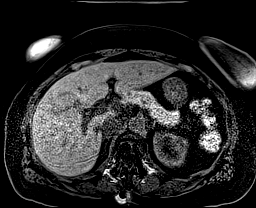
[im 64/64]
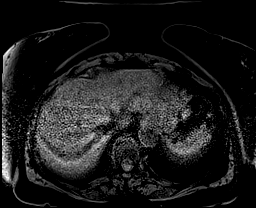

[Series 11: post 25 sec · axial · 2.3mm · 1.41mm/px · z∈[-29,+116]mm · 4 of 64 slices shown]
[im 1/64]
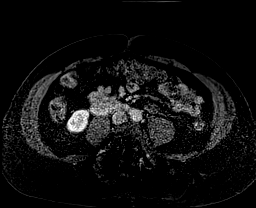
[im 22/64]
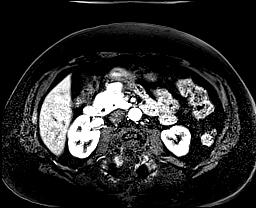
[im 43/64]
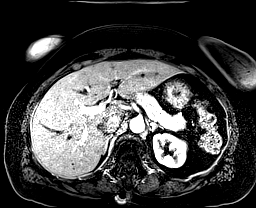
[im 64/64]
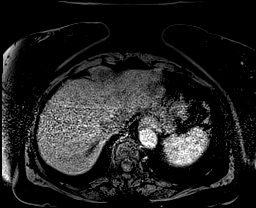

[Series 12: post 25 sec_sub · axial · 2.3mm · 1.41mm/px · z∈[-29,+116]mm · 4 of 64 slices shown]
[im 1/64]
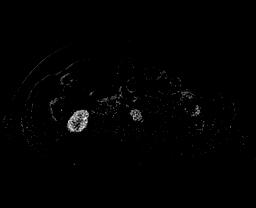
[im 22/64]
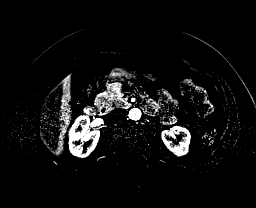
[im 43/64]
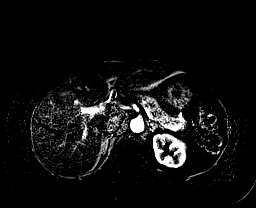
[im 64/64]
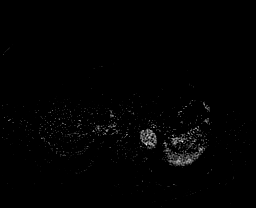

[Series 13: post 45 sec · axial · 2.3mm · 1.41mm/px · z∈[-29,+116]mm · 4 of 64 slices shown]
[im 1/64]
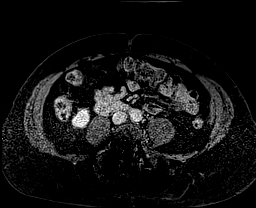
[im 22/64]
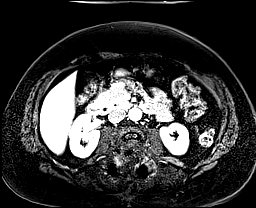
[im 43/64]
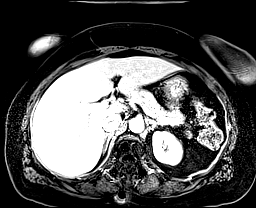
[im 64/64]
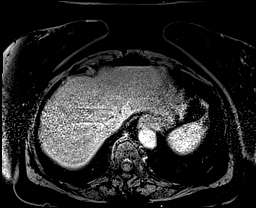

[Series 14: post 45 sec_sub · axial · 2.3mm · 1.41mm/px · z∈[-29,+116]mm · 4 of 64 slices shown]
[im 1/64]
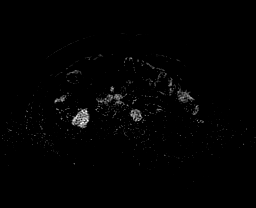
[im 22/64]
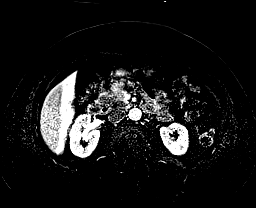
[im 43/64]
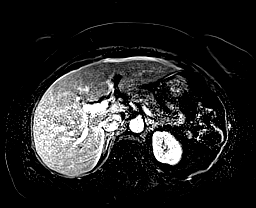
[im 64/64]
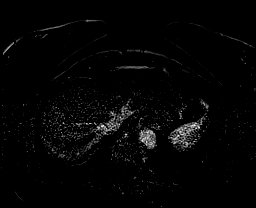

[Series 15: post 90 sec · axial · 2.3mm · 1.41mm/px · z∈[-29,+116]mm · 4 of 64 slices shown]
[im 1/64]
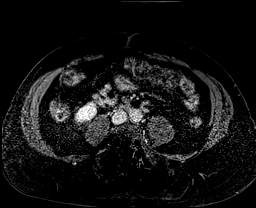
[im 22/64]
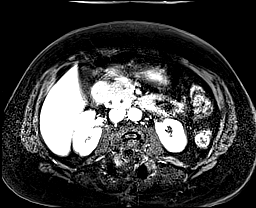
[im 43/64]
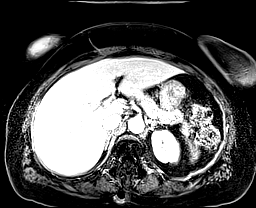
[im 64/64]
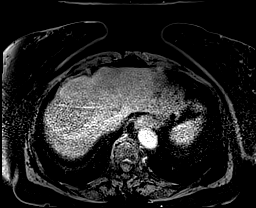

[Series 16: post 90 sec_sub · axial · 2.3mm · 1.41mm/px · z∈[-29,+116]mm · 4 of 64 slices shown]
[im 1/64]
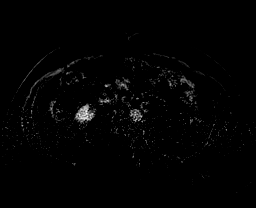
[im 22/64]
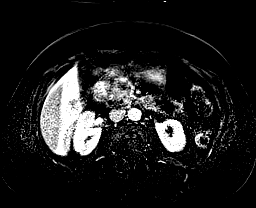
[im 43/64]
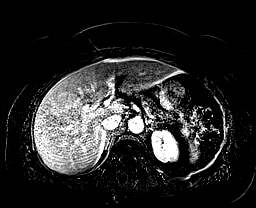
[im 64/64]
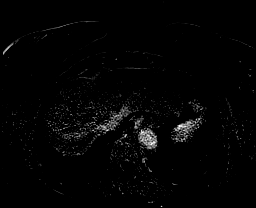

[Series 17: T1 dynamic post-contrast · coronal · 2.6mm · 0.78mm/px · 3 of 52 slices shown]
[im 1/52]
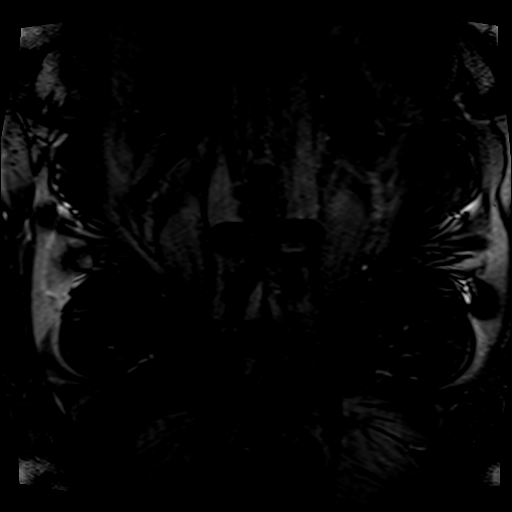
[im 26/52]
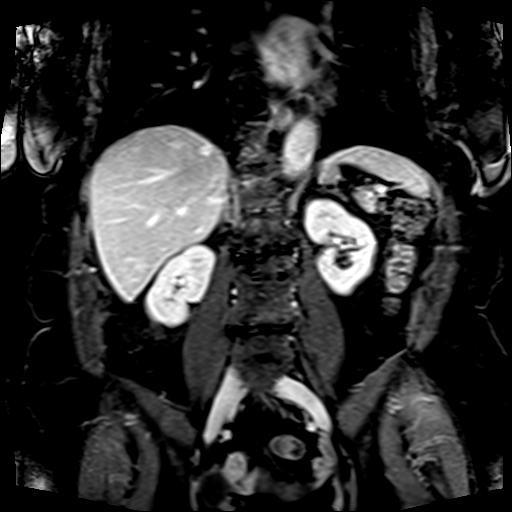
[im 52/52]
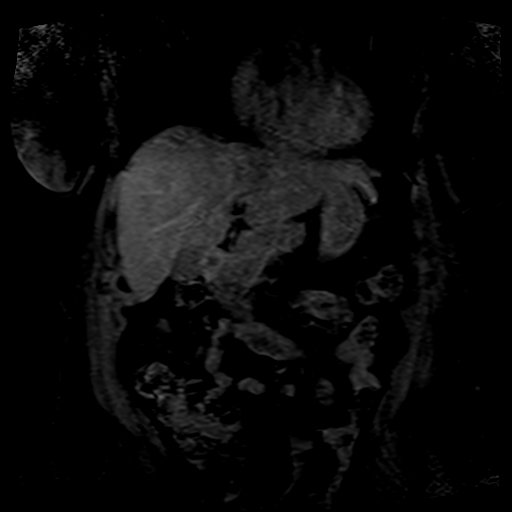

[Series 18: post axial 3+ · axial · 2.3mm · 1.41mm/px · z∈[-29,+116]mm · 4 of 64 slices shown (1 of 2)]
[im 1/64]
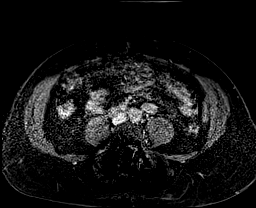
[im 22/64]
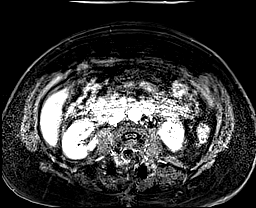
[im 43/64]
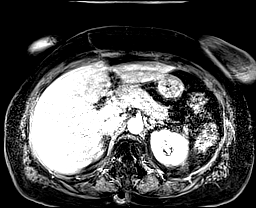
[im 64/64]
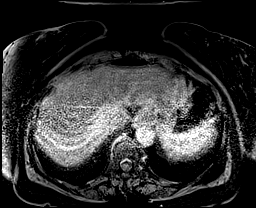

[Series 19: post axial 3+ · axial · 2.3mm · 1.41mm/px · z∈[-29,+116]mm · 4 of 64 slices shown (2 of 2)]
[im 1/64]
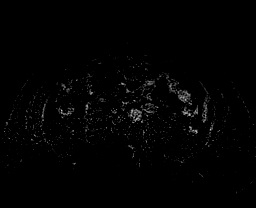
[im 22/64]
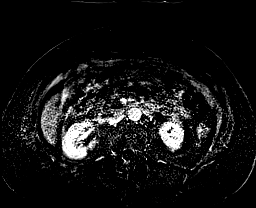
[im 43/64]
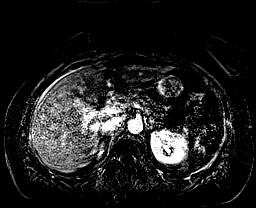
[im 64/64]
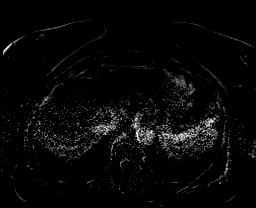

[48 of 48 positions shown; findings below may reference images not displayed]

FINDINGS: Limited due to motion.

Lower chest: No acute findings.

Hepatobiliary: No mass or other parenchymal abnormality identified.
Gallbladder appears within normal limits. No biliary ductal
dilatation identified.

Pancreas: No mass, inflammatory changes, or other parenchymal
abnormality identified.

Spleen:  Within normal limits in size and appearance.

Adrenals/Urinary Tract: Adrenal glands are normal. Kidneys are
within normal limits. No enhancing mass identified. Variant
dromedary hump is visualized on the left as seen on CT.

Stomach/Bowel: No evidence of bowel obstruction. Colonic
diverticulosis.

Vascular/Lymphatic: No pathologically enlarged lymph nodes
identified. No abdominal aortic aneurysm demonstrated.

Other:  No ascites.

Musculoskeletal: No suspicious bone lesions identified. Postsurgical
changes in the spine.
IMPRESSION: 1. No suspicious renal mass identified.
2. Colonic diverticulosis.

## 2022-06-03 DIAGNOSIS — R350 Frequency of micturition: Secondary | ICD-10-CM | POA: Diagnosis not present

## 2022-06-03 DIAGNOSIS — N390 Urinary tract infection, site not specified: Secondary | ICD-10-CM | POA: Diagnosis not present

## 2022-07-14 NOTE — Progress Notes (Unsigned)
   I, Peterson Lombard, LAT, ATC acting as a scribe for [name], MD.  Melissa Maldonado is a 81 y.o. female who presents to [facility] today for cont'd bilat knee pain. She has a hx of a L knee partial knee replacement performed by [name]. Pt completed the Gelsyn series, 3/3, in her R knee on 06/25/21. Pt was last seen by [name] on 05/12/22 and was given a right knee steroid injection.  Today, patient reports  Dx imaging: 12/02/21 R & L knee XR  12/02/2020 R knee XR             04/27/14 L knee XR  Pertinent review of systems: ***  Relevant historical information: ***   Exam:  There were no vitals taken for this visit. General: Well Developed, well nourished, and in no acute distress.   MSK: ***    Lab and Radiology Results No results found for this or any previous visit (from the past 72 hour(s)). No results found.     Assessment and Plan: 81 y.o. female with ***   PDMP not reviewed this encounter. No orders of the defined types were placed in this encounter.  No orders of the defined types were placed in this encounter.    Discussed warning signs or symptoms. Please see discharge instructions. Patient expresses understanding.   ***

## 2022-07-15 ENCOUNTER — Ambulatory Visit (INDEPENDENT_AMBULATORY_CARE_PROVIDER_SITE_OTHER): Payer: Medicare Other | Admitting: Family Medicine

## 2022-07-15 VITALS — BP 164/72 | HR 54 | Ht 65.0 in | Wt 198.0 lb

## 2022-07-15 DIAGNOSIS — M1711 Unilateral primary osteoarthritis, right knee: Secondary | ICD-10-CM | POA: Diagnosis not present

## 2022-07-15 DIAGNOSIS — G8929 Other chronic pain: Secondary | ICD-10-CM | POA: Diagnosis not present

## 2022-07-15 DIAGNOSIS — M25561 Pain in right knee: Secondary | ICD-10-CM

## 2022-07-15 NOTE — Patient Instructions (Addendum)
Thank you for coming in today.   You should hear from Dr Trevor Mace office soon.  Let me know if you have a problem.   If you want to do the Jiffy Knee let me know and we can get that arranged.

## 2022-07-19 DIAGNOSIS — M159 Polyosteoarthritis, unspecified: Secondary | ICD-10-CM | POA: Diagnosis not present

## 2022-07-19 DIAGNOSIS — N1832 Chronic kidney disease, stage 3b: Secondary | ICD-10-CM | POA: Diagnosis not present

## 2022-07-19 DIAGNOSIS — E7800 Pure hypercholesterolemia, unspecified: Secondary | ICD-10-CM | POA: Diagnosis not present

## 2022-07-19 DIAGNOSIS — I10 Essential (primary) hypertension: Secondary | ICD-10-CM | POA: Diagnosis not present

## 2022-07-29 ENCOUNTER — Ambulatory Visit (INDEPENDENT_AMBULATORY_CARE_PROVIDER_SITE_OTHER): Payer: Medicare Other | Admitting: Orthopaedic Surgery

## 2022-07-29 ENCOUNTER — Encounter: Payer: Self-pay | Admitting: Orthopaedic Surgery

## 2022-07-29 DIAGNOSIS — M1711 Unilateral primary osteoarthritis, right knee: Secondary | ICD-10-CM

## 2022-07-29 NOTE — Progress Notes (Signed)
I did physically see the patient with Melissa Stabile, PA-C and discussed in detail the situation with her right knee.  We will work on scheduling her for a right total knee arthroplasty.  I agree with yGil Clark's note with assessment and plan.

## 2022-07-29 NOTE — Progress Notes (Signed)
Office Visit Note   Patient: Melissa Maldonado           Date of Birth: 05/19/1941           MRN: [id number] Visit Date: 07/29/2022              Requested by: [name], MD [place],  [place] [address] PCP: [name], MD   Assessment & Plan: Visit Diagnoses:  1. Primary osteoarthritis of right knee     Plan: Right knee radiographs were reviewed.  Questions were encouraged and answered by [name] itself.  Given patient's failure of conservative treatment which is included time injections assistive devices and bracing.  Recommend right total knee replacement.  Risk benefits of surgery discussed with patient.  Risk include but are not limited to DVT/PE, nerve vessel injury, blood loss, wound healing problems, and prolonged pain.  Will work on scheduling patient near future for right total knee replacement.  Follow-Up Instructions: Return for post op.   Orders:  No orders of the defined types were placed in this encounter.  No orders of the defined types were placed in this encounter.     Procedures: No procedures performed   Clinical Data: No additional findings.   Subjective: Chief Complaint  Patient presents with   Right Knee - Pain    HPI Patient is a 82 year old female who comes in today at the request of Dr. Georgina Maldonado.  Patient has known right knee arthritis.  She states she has severe pain in her knee and ranks her pain to be 8 out of 10 pain at worst.  She takes Tylenol and is unsure if this really helps.  She also occasionally uses cane but rarely.  She tried a knee brace but did not tolerate this well.  She has had injections in her knees and states that they gave her no significant relief.  History of left partial knee replacement done by Dr. Mardelle Maldonado in the past.  States she still has some occasional pain medial aspect of the left knee.  She had no complications postoperatively from the left knee partial replacement.  She is overall healthy with  essential hypertension.  No history of DVT PE.  She denies any ongoing infections at this point in time.  No known injury to the right knee.  Notes that her knee pain overall is becoming worse. Radiographs right knee independently reviewed.  3 views of the right knee dated 12/02/2021 show tricompartmental arthritic changes with near bone-on-bone medial compartment and severe patellofemoral arthritic changes.  Spurring is noted off the lateral compartment.  No acute fractures.  Knee is well located.  Review of Systems  Constitutional:  Negative for chills and fever.  Please see HPI otherwise negative or noncontributory.   Objective: Vital Signs: There were no vitals taken for this visit.  Physical Exam Constitutional:      Appearance: She is not ill-appearing or diaphoretic.  Pulmonary:     Effort: Pulmonary effort is normal.  Neurological:     Mental Status: She is alert and oriented to person, place, and time.  Psychiatric:        Mood and Affect: Mood normal.     Ortho Exam Bilateral knees full extension flexion both knees to approximately 100 1015 degrees.  No instability valgus varus stressing of either knee.  Patellofemoral crepitus right knee.  No abnormal warmth erythema or effusion of either knee.  Well-healed surgical incision left knee.  Right knee  with tenderness along medial joint line. Specialty Comments:  No specialty comments available.  Imaging: No results found.   PMFS History: Patient Active Problem List   Diagnosis Date Noted   Arthritis of right acromioclavicular joint 08/28/2021   Impingement syndrome of right shoulder 08/19/2021   Nontraumatic incomplete tear of right rotator cuff 08/19/2021   Paresthesia 12/05/2020   Low back pain with right-sided sciatica 12/05/2020   Trochanteric bursitis, right hip 04/24/2020   Essential hypertension 01/12/2020   Sinus bradycardia 01/12/2020   Dyspnea on exertion 01/12/2020   Lumbar stenosis with neurogenic  claudication 02/01/2017   Osteoarthritis of left knee 04/27/2014   Past Medical History:  Diagnosis Date   Arthritis    Chronic back pain    lumbar stenosis   Early cataracts, bilateral    GERD (gastroesophageal reflux disease)    occasionally but no meds required   Headache(784.0)    occasionally   History of blood transfusion    History of colon polyps    Hyperlipidemia    takes Lipitor every other day   Hypertension    takes losartan daily   Neuropathy    Osteoarthritis of left knee 04/27/2014   Restless leg    Shortness of breath    with exertion   SOB (shortness of breath) 2013   CPET   Tear of medial meniscus of left knee 09/08/2013    Family History  Problem Relation Age of Onset   Lupus Mother    Heart attack Father     Past Surgical History:  Procedure Laterality Date   APPENDECTOMY     BACK SURGERY  2010   lumb fusion   COLONOSCOPY     EYE SURGERY     both cataracts   KNEE ARTHROSCOPY WITH MEDIAL MENISECTOMY Left 09/08/2013   Procedure: LEFT KNEE ARTHROSCOPY WITH PARTIAL MEDIAL MENISCECTOMY;  Surgeon: Melissa Bridge, MD;  Location: [facility];  Service: Orthopedics;  Laterality: Left;   LUMBAR LAMINECTOMY/DECOMPRESSION MICRODISCECTOMY  05/13/2012   Procedure: LUMBAR LAMINECTOMY/DECOMPRESSION MICRODISCECTOMY 1 LEVEL;  Surgeon: Melissa Miss, MD;  Location: [facility];  Service: Neurosurgery;  Laterality: Bilateral;  Bilateral Lumbar one -two Decompressive Laminectomy   PARTIAL KNEE ARTHROPLASTY Left 04/27/2014   Procedure: LEFT UNICOMPARTMENTAL KNEE;  Surgeon: Melissa Bridge, MD;  Location: [facility];  Service: Orthopedics;  Laterality: Left;   UPPER GI ENDOSCOPY     Social History   Occupational History   Occupation: Retired  Tobacco Use   Smoking status: Never   Smokeless tobacco: Never  Vaping Use   Vaping Use: Never used  Substance and Sexual Activity   Alcohol use: Yes    Comment: socially wine   Drug use: Never    Sexual activity: Yes    Birth control/protection: Post-menopausal

## 2022-08-05 DIAGNOSIS — N390 Urinary tract infection, site not specified: Secondary | ICD-10-CM | POA: Diagnosis not present

## 2022-08-17 DIAGNOSIS — N390 Urinary tract infection, site not specified: Secondary | ICD-10-CM | POA: Diagnosis not present

## 2022-08-25 ENCOUNTER — Other Ambulatory Visit: Payer: Self-pay

## 2022-08-26 ENCOUNTER — Other Ambulatory Visit: Payer: Self-pay | Admitting: Physician Assistant

## 2022-08-26 DIAGNOSIS — Z01818 Encounter for other preprocedural examination: Secondary | ICD-10-CM

## 2022-08-31 DIAGNOSIS — N1832 Chronic kidney disease, stage 3b: Secondary | ICD-10-CM | POA: Diagnosis not present

## 2022-09-01 NOTE — Pre-Procedure Instructions (Signed)
Surgical Instructions    Your procedure is scheduled on September 08, 2022.  Report to [facility] Main Entrance "A" at 12:00 P.M., then check in with the Admitting office.  Call this number if you have problems the morning of surgery:  [phone]  If you have any questions prior to your surgery date call [phone]: Open Monday-Friday 8am-4pm If you experience any cold or flu symptoms such as cough, fever, chills, shortness of breath, etc. between now and your scheduled surgery, please notify us at the above number.     Remember:  Do not eat after midnight the night before your surgery  You may drink clear liquids until 11:00 AM the morning of your surgery.   Clear liquids allowed are: Water, Non-Citrus Juices (without pulp), Carbonated Beverages, Clear Tea, Black Coffee Only (NO MILK, CREAM OR POWDERED CREAMER of any kind), and Gatorade.  Patient Instructions  The night before surgery:  No food after midnight. ONLY clear liquids after midnight  The day of surgery (if you do NOT have diabetes):  Drink ONE (1) Pre-Surgery Clear Ensure by 11:00 AM the morning of surgery. Drink in one sitting. Do not sip.  This drink was given to you during your hospital  pre-op appointment visit.  Nothing else to drink after completing the  Pre-Surgery Clear Ensure.         If you have questions, please contact your surgeon's office.      Take these medicines the morning of surgery with A SIP OF WATER:  acetaminophen (TYLENOL 8 HOUR)   amLODipine (NORVASC)   dorzolamide (TRUSOPT) 2 % ophthalmic solution    STOP taking your WEGOVY one week prior to your surgery. Your last dose of this medication will be February 11th.   As of today, STOP taking any Aspirin (unless otherwise instructed by your surgeon) Aleve, Naproxen, Ibuprofen, Motrin, Advil, Goody's, BC's, all herbal medications, fish oil, and all vitamins.                     Do NOT Smoke (Tobacco/Vaping) for 24 hours prior to your  procedure.  If you use a CPAP at night, you may bring your mask/headgear for your overnight stay.   Contacts, glasses, piercing's, hearing aid's, dentures or partials may not be worn into surgery, please bring cases for these belongings.    For patients admitted to the hospital, discharge time will be determined by your treatment team.   Patients discharged the day of surgery will not be allowed to drive home, and someone needs to stay with them for 24 hours.  SURGICAL WAITING ROOM VISITATION Patients having surgery or a procedure may have no more than 2 support people in the waiting area - these visitors may rotate.   Children under the age of 79 must have an adult with them who is not the patient. If the patient needs to stay at the hospital during part of their recovery, the visitor guidelines for inpatient rooms apply. Pre-op nurse will coordinate an appropriate time for 1 support person to accompany patient in pre-op.  This support person may not rotate.   Please refer to the [facility] website for the visitor guidelines for Inpatients (after your surgery is over and you are in a regular room).    Special instructions:   [facility]- Preparing For Surgery  Before surgery, you can play an important role. Because skin is not sterile, your skin needs to be as free of germs as possible. You can  reduce the number of germs on your skin by washing with CHG (chlorahexidine gluconate) Soap before surgery.  CHG is an antiseptic cleaner which kills germs and bonds with the skin to continue killing germs even after washing.    Oral Hygiene is also important to reduce your risk of infection.  Remember - BRUSH YOUR TEETH THE MORNING OF SURGERY WITH YOUR REGULAR TOOTHPASTE  Please do not use if you have an allergy to CHG or antibacterial soaps. If your skin becomes reddened/irritated stop using the CHG.  Do not shave (including legs and underarms) for at least 48 hours prior to first CHG shower. It  is OK to shave your face.  Please follow these instructions carefully.   Shower the NIGHT BEFORE SURGERY and the MORNING OF SURGERY  If you chose to wash your hair, wash your hair first as usual with your normal shampoo.  After you shampoo, rinse your hair and body thoroughly to remove the shampoo.  Use CHG Soap as you would any other liquid soap. You can apply CHG directly to the skin and wash gently with a scrungie or a clean washcloth.   Apply the CHG Soap to your body ONLY FROM THE NECK DOWN.  Do not use on open wounds or open sores. Avoid contact with your eyes, ears, mouth and genitals (private parts). Wash Face and genitals (private parts)  with your normal soap.   Wash thoroughly, paying special attention to the area where your surgery will be performed.  Thoroughly rinse your body with warm water from the neck down.  DO NOT shower/wash with your normal soap after using and rinsing off the CHG Soap.  Pat yourself dry with a CLEAN TOWEL.  Wear CLEAN PAJAMAS to bed the night before surgery  Place CLEAN SHEETS on your bed the night before your surgery  DO NOT SLEEP WITH PETS.   Day of Surgery: Take a shower with CHG soap. Do not wear jewelry or makeup Do not wear lotions, powders, perfumes/colognes, or deodorant. Do not shave 48 hours prior to surgery.  Men may shave face and neck. Do not bring valuables to the hospital.  [facility] is not responsible for any belongings or valuables. Do not wear nail polish, gel polish, artificial nails, or any other type of covering on natural nails (fingers and toes) If you have artificial nails or gel coating that need to be removed by a nail salon, please have this removed prior to surgery. Artificial nails or gel coating may interfere with anesthesia's ability to adequately monitor your vital signs.  Wear Clean/Comfortable clothing the morning of surgery Remember to brush your teeth WITH YOUR REGULAR TOOTHPASTE.   Please read over  the following fact sheets that you were given.    If you received a COVID test during your pre-op visit  it is requested that you wear a mask when out in public, stay away from anyone that may not be feeling well and notify your surgeon if you develop symptoms. If you have been in contact with anyone that has tested positive in the last 10 days please notify you surgeon.

## 2022-09-02 ENCOUNTER — Encounter (HOSPITAL_COMMUNITY): Payer: Self-pay

## 2022-09-02 ENCOUNTER — Other Ambulatory Visit: Payer: Self-pay

## 2022-09-02 ENCOUNTER — Encounter (HOSPITAL_COMMUNITY)
Admission: RE | Admit: 2022-09-02 | Discharge: 2022-09-02 | Disposition: A | Discharge status: Home / Self Care | Payer: Medicare Other | Source: Ambulatory Visit | Attending: Orthopaedic Surgery | Admitting: Orthopaedic Surgery

## 2022-09-02 VITALS — BP 141/97 | HR 78 | Temp 98.1°F | Resp 18 | Ht 65.0 in | Wt 194.0 lb

## 2022-09-02 DIAGNOSIS — Z01812 Encounter for preprocedural laboratory examination: Secondary | ICD-10-CM | POA: Insufficient documentation

## 2022-09-02 DIAGNOSIS — Z01818 Encounter for other preprocedural examination: Secondary | ICD-10-CM

## 2022-09-02 LAB — SURGICAL PCR SCREEN
MRSA, PCR: NEGATIVE
Staphylococcus aureus: NEGATIVE

## 2022-09-02 LAB — CBC
HCT: 41.8 % (ref 36.0–46.0)
Hemoglobin: 13.9 g/dL (ref 12.0–15.0)
MCH: 30.2 pg (ref 26.0–34.0)
MCHC: 33.3 g/dL (ref 30.0–36.0)
MCV: 90.7 fL (ref 80.0–100.0)
Platelets: 303 10*3/uL (ref 150–400)
RBC: 4.61 MIL/uL (ref 3.87–5.11)
RDW: 13.1 % (ref 11.5–15.5)
WBC: 9.2 10*3/uL (ref 4.0–10.5)
nRBC: 0 % (ref 0.0–0.2)

## 2022-09-02 LAB — COMPREHENSIVE METABOLIC PANEL
ALT: 14 U/L (ref 0–44)
AST: 17 U/L (ref 15–41)
Albumin: 3.8 g/dL (ref 3.5–5.0)
Alkaline Phosphatase: 64 U/L (ref 38–126)
Anion gap: 9 (ref 5–15)
BUN: 19 mg/dL (ref 8–23)
CO2: 25 mmol/L (ref 22–32)
Calcium: 9.4 mg/dL (ref 8.9–10.3)
Chloride: 104 mmol/L (ref 98–111)
Creatinine, Ser: 1.49 mg/dL — ABNORMAL HIGH (ref 0.44–1.00)
GFR, Estimated: 36 mL/min — ABNORMAL LOW (ref >60)
Glucose, Bld: 76 mg/dL (ref 70–99)
Potassium: 3.8 mmol/L (ref 3.5–5.1)
Sodium: 138 mmol/L (ref 135–145)
Total Bilirubin: 0.5 mg/dL (ref 0.3–1.2)
Total Protein: 7.3 g/dL (ref 6.5–8.1)

## 2022-09-02 LAB — TYPE AND SCREEN
ABO/RH(D): O POS
Antibody Screen: NEGATIVE

## 2022-09-02 NOTE — Progress Notes (Signed)
PCP - Dr. [name] Cardiologist - Dr. Quay Burow (Last appointment 2022 and was told to return on as needed basis)  PPM/ICD - Denies Device Orders - n/a Rep Notified - n/a  Chest x-ray - n/a EKG - 02/15/2022 Stress Test - Denies ECHO - 02/07/2020 Cardiac Cath - Denies  Sleep Study - Denies CPAP - n/a  No DM  Last dose of GLP1 agonist- Last Wegovy Dose 08/30/22 GLP1 instructions: Pt instructed to not take her next dose on 09/06/22. Pt understood instructions  Blood Thinner Instructions: n/a Aspirin Instructions: n/a  ERAS Protcol - Clear liquids until 1000 PRE-SURGERY Ensure or G2- Ensure given to patient at PAT appointment  COVID TEST- n/a   Anesthesia review: No.   Patient denies shortness of breath, fever, cough and chest pain at PAT appointment   All instructions explained to the patient, with a verbal understanding of the material. Patient agrees to go over the instructions while at home for a better understanding. Patient also instructed to self quarantine after being tested for COVID-19. The opportunity to ask questions was provided.

## 2022-09-03 NOTE — Progress Notes (Signed)
preop

## 2022-09-07 ENCOUNTER — Telehealth: Payer: Self-pay | Admitting: *Deleted

## 2022-09-07 DIAGNOSIS — I129 Hypertensive chronic kidney disease with stage 1 through stage 4 chronic kidney disease, or unspecified chronic kidney disease: Secondary | ICD-10-CM | POA: Diagnosis not present

## 2022-09-07 DIAGNOSIS — D631 Anemia in chronic kidney disease: Secondary | ICD-10-CM | POA: Diagnosis not present

## 2022-09-07 DIAGNOSIS — N1832 Chronic kidney disease, stage 3b: Secondary | ICD-10-CM | POA: Diagnosis not present

## 2022-09-07 DIAGNOSIS — M1711 Unilateral primary osteoarthritis, right knee: Secondary | ICD-10-CM | POA: Insufficient documentation

## 2022-09-07 DIAGNOSIS — N2581 Secondary hyperparathyroidism of renal origin: Secondary | ICD-10-CM | POA: Diagnosis not present

## 2022-09-07 NOTE — Care Plan (Signed)
[facility] RNCM call to patient to discuss her upcoming Right total knee arthroplasty with [name] on 09/08/22. She is an Ortho bundle patient through [facility] and is agreeable to case management. She lives with her spouse, who will be assisting after surgery. Anticipate HHPT will be needed after a short hospital stay. Referral made to [facility] after choice provided. She has a RW she states along with a 3in1/BSC. No DME needs. Will assist with setting up OPPT when appropriate. Reviewed post op care instructions. Will continue to follow for needs.

## 2022-09-07 NOTE — H&P (Signed)
TOTAL KNEE ADMISSION H&P  Patient is being admitted for right total knee arthroplasty.  Subjective:  Chief Complaint:right knee pain.  HPI: Melissa Maldonado, 82 y.o. female, has a history of pain and functional disability in the right knee due to arthritis and has failed non-surgical conservative treatments for greater than 12 weeks to includeNSAID's and/or analgesics, corticosteriod injections, flexibility and strengthening excercises, use of assistive devices, weight reduction as appropriate, and activity modification.  Onset of symptoms was gradual, starting 2 years ago with gradually worsening course since that time. The patient noted no past surgery on the right knee(s).  Patient currently rates pain in the right knee(s) at 10 out of 10 with activity. Patient has night pain, worsening of pain with activity and weight bearing, pain that interferes with activities of daily living, pain with passive range of motion, crepitus, and joint swelling.  Patient has evidence of subchondral sclerosis, periarticular osteophytes, and joint space narrowing by imaging studies. There is no active infection.  Patient Active Problem List   Diagnosis Date Noted   Unilateral primary osteoarthritis, right knee 09/07/2022   Arthritis of right acromioclavicular joint 08/28/2021   Impingement syndrome of right shoulder 08/19/2021   Nontraumatic incomplete tear of right rotator cuff 08/19/2021   Paresthesia 12/05/2020   Low back pain with right-sided sciatica 12/05/2020   Trochanteric bursitis, right hip 04/24/2020   Essential hypertension 01/12/2020   Sinus bradycardia 01/12/2020   Dyspnea on exertion 01/12/2020   Lumbar stenosis with neurogenic claudication 02/01/2017   Osteoarthritis of left knee 04/27/2014   Past Medical History:  Diagnosis Date   Arthritis    Chronic back pain    lumbar stenosis   Early cataracts, bilateral    GERD (gastroesophageal reflux disease)    occasionally but no meds required    History of blood transfusion    History of colon polyps    Hyperlipidemia    takes Lipitor every other day   Hypertension    takes losartan daily   Neuropathy    Osteoarthritis of left knee 04/27/2014   Restless leg    Shortness of breath    with exertion   SOB (shortness of breath) 2013   CPET   Tear of medial meniscus of left knee 09/08/2013    Past Surgical History:  Procedure Laterality Date   APPENDECTOMY     BACK SURGERY  07/20/2008   lumb fusion   COLONOSCOPY  2018   EYE SURGERY     both cataracts   KNEE ARTHROSCOPY WITH MEDIAL MENISECTOMY Left 09/08/2013   Procedure: LEFT KNEE ARTHROSCOPY WITH PARTIAL MEDIAL MENISCECTOMY;  Surgeon: [name], MD;  Location: [facility];  Service: Orthopedics;  Laterality: Left;   LUMBAR LAMINECTOMY/DECOMPRESSION MICRODISCECTOMY  05/13/2012   Procedure: LUMBAR LAMINECTOMY/DECOMPRESSION MICRODISCECTOMY 1 LEVEL;  Surgeon: [name], MD;  Location: [facility];  Service: Neurosurgery;  Laterality: Bilateral;  Bilateral Lumbar one -two Decompressive Laminectomy   PARTIAL KNEE ARTHROPLASTY Left 04/27/2014   Procedure: LEFT UNICOMPARTMENTAL KNEE;  Surgeon: [name], MD;  Location: [facility];  Service: Orthopedics;  Laterality: Left;   TUBAL LIGATION  1974   UPPER GI ENDOSCOPY      No current facility-administered medications for this encounter.   Current Outpatient Medications  Medication Sig Dispense Refill Last Dose   acetaminophen (TYLENOL 8 HOUR) 650 MG CR tablet Take 1,300 mg by mouth daily.      amLODipine (NORVASC) 5 MG tablet Take 5 mg by mouth  daily.      Cholecalciferol (VITAMIN D) 50 MCG (2000 UT) tablet Take 2,000 Units by mouth daily.      dorzolamide (TRUSOPT) 2 % ophthalmic solution Place 1 drop into the left eye 2 (two) times daily.      OVER THE COUNTER MEDICATION Apply 1 Application topically at bedtime as needed (pain). Magnesium Balm      rOPINIRole (REQUIP) 0.5 MG tablet  Take 0.5 mg by mouth at bedtime.      WEGOVY 2.4 MG/0.75ML SOAJ Inject 2.4 mg into the skin every Sunday.      Allergies  Allergen Reactions   Aspirin Other (See Comments)    "knocked out"   Vicks Vaporub [Liniments] Other (See Comments)    Makes her feel "out of it"   Codeine Nausea Only    Social History   Tobacco Use   Smoking status: Never   Smokeless tobacco: Never  Substance Use Topics   Alcohol use: Not Currently    Comment: socially wine    Family History  Problem Relation Age of Onset   Lupus Mother    Heart attack Father      Review of Systems  Musculoskeletal:  Positive for gait problem and joint swelling.  All other systems reviewed and are negative.   Objective:  Physical Exam Vitals reviewed.  Constitutional:      Appearance: Normal appearance. She is normal weight.  HENT:     Head: Normocephalic and atraumatic.  Eyes:     Extraocular Movements: Extraocular movements intact.     Pupils: Pupils are equal, round, and reactive to light.  Cardiovascular:     Rate and Rhythm: Normal rate.  Pulmonary:     Effort: Pulmonary effort is normal.     Breath sounds: Normal breath sounds.  Abdominal:     Palpations: Abdomen is soft.  Musculoskeletal:     Cervical back: Normal range of motion and neck supple.     Right knee: Effusion, bony tenderness and crepitus present. Decreased range of motion. Tenderness present over the medial joint line and lateral joint line. Abnormal alignment.  Neurological:     Mental Status: She is alert and oriented to person, place, and time.  Psychiatric:        Behavior: Behavior normal.     Vital signs in last 24 hours:    Labs:   Estimated body mass index is 32.28 kg/m as calculated from the following:   Height as of 09/02/22: 5' 5"$  (1.651 m).   Weight as of 09/02/22: 88 kg.   Imaging Review Plain radiographs demonstrate severe degenerative joint disease of the right knee(s). The overall alignment ismild varus.  The bone quality appears to be good for age and reported activity level.      Assessment/Plan:  End stage arthritis, right knee   The patient history, physical examination, clinical judgment of the provider and imaging studies are consistent with end stage degenerative joint disease of the right knee(s) and total knee arthroplasty is deemed medically necessary. The treatment options including medical management, injection therapy arthroscopy and arthroplasty were discussed at length. The risks and benefits of total knee arthroplasty were presented and reviewed. The risks due to aseptic loosening, infection, stiffness, patella tracking problems, thromboembolic complications and other imponderables were discussed. The patient acknowledged the explanation, agreed to proceed with the plan and consent was signed. Patient is being admitted for inpatient treatment for surgery, pain control, PT, OT, prophylactic antibiotics, VTE prophylaxis, progressive ambulation and ADL's  and discharge planning. The patient is planning to be discharged home with home health services

## 2022-09-07 NOTE — Telephone Encounter (Signed)
Ortho bundle pre-op call completed. Patient verbalized she doesn't take Aspirin due to nausea and intolerance. She mentioned she was told not to take any NSAIDS due to kidney issues. I don't see this on her chart however in problems list. Thanks.

## 2022-09-08 ENCOUNTER — Ambulatory Visit (HOSPITAL_COMMUNITY)
Admission: RE | Admit: 2022-09-08 | Discharge: 2022-09-08 | Disposition: A | Discharge status: Home / Self Care | Payer: Medicare Other | Attending: Orthopaedic Surgery | Admitting: Orthopaedic Surgery

## 2022-09-08 ENCOUNTER — Other Ambulatory Visit: Payer: Self-pay

## 2022-09-08 ENCOUNTER — Ambulatory Visit (HOSPITAL_COMMUNITY): Payer: Medicare Other | Admitting: Anesthesiology

## 2022-09-08 ENCOUNTER — Encounter (HOSPITAL_COMMUNITY)
Admission: RE | Disposition: A | Discharge status: Home / Self Care | Payer: Self-pay | Source: Home / Self Care | Attending: Orthopaedic Surgery

## 2022-09-08 ENCOUNTER — Encounter (HOSPITAL_COMMUNITY): Payer: Self-pay | Admitting: Orthopaedic Surgery

## 2022-09-08 DIAGNOSIS — M25561 Pain in right knee: Secondary | ICD-10-CM | POA: Diagnosis present

## 2022-09-08 DIAGNOSIS — I4891 Unspecified atrial fibrillation: Secondary | ICD-10-CM | POA: Insufficient documentation

## 2022-09-08 DIAGNOSIS — M1711 Unilateral primary osteoarthritis, right knee: Secondary | ICD-10-CM | POA: Diagnosis not present

## 2022-09-08 DIAGNOSIS — Z539 Procedure and treatment not carried out, unspecified reason: Secondary | ICD-10-CM | POA: Insufficient documentation

## 2022-09-08 SURGERY — ARTHROPLASTY, KNEE, TOTAL
Anesthesia: Choice | Site: Knee | Laterality: Right

## 2022-09-08 MED ORDER — POVIDONE-IODINE 10 % EX SWAB
2.0000 | Freq: Once | CUTANEOUS | Status: AC
  Administered 2022-09-08: 2 via TOPICAL

## 2022-09-08 MED ORDER — CHLORHEXIDINE GLUCONATE 0.12 % MT SOLN
15.0000 mL | Freq: Once | OROMUCOSAL | Status: AC
  Administered 2022-09-08: 15 mL via OROMUCOSAL
  Filled 2022-09-08: qty 15

## 2022-09-08 MED ORDER — ORAL CARE MOUTH RINSE: 15.0000 mL | Freq: Once | OROMUCOSAL | Status: AC

## 2022-09-08 MED ORDER — ACETAMINOPHEN 500 MG PO TABS
1000.0000 mg | ORAL_TABLET | Freq: Once | ORAL | Status: AC
  Administered 2022-09-08: 1000 mg via ORAL
  Filled 2022-09-08: qty 2

## 2022-09-08 MED ORDER — LACTATED RINGERS IV SOLN: INTRAVENOUS | Status: DC

## 2022-09-08 MED ORDER — TRANEXAMIC ACID-NACL 1000-0.7 MG/100ML-% IV SOLN
1000.0000 mg | INTRAVENOUS | Status: DC
  Filled 2022-09-08: qty 100

## 2022-09-08 MED ORDER — CEFAZOLIN SODIUM-DEXTROSE 2-4 GM/100ML-% IV SOLN
2.0000 g | INTRAVENOUS | Status: DC
  Filled 2022-09-08: qty 100

## 2022-09-08 NOTE — Progress Notes (Signed)
Anesthesia note: Pt presented today with new onset rate controlled afib. I discussed with [name] and the patient that she should be seen by Dr. Gwenlyn Found and have her surgery rescheduled after she is seen by him.

## 2022-09-08 NOTE — Interval H&P Note (Signed)
History and Physical Interval Note: The patient understands fully that she is here for a right total knee replacement to treat her severe right knee arthritis.  There is been no acute or interval change in her medical status.  See H&P.  The risks and benefits of surgery been discussed in detail and informed consent is obtained.  The right operative knee has been marked.  09/08/2022 1:23 PM  Melissa Maldonado  has presented today for surgery, with the diagnosis of endstage osteoarthritis right knee.  The various methods of treatment have been discussed with the patient and family. After consideration of risks, benefits and other options for treatment, the patient has consented to  Procedure(s): RIGHT TOTAL KNEE ARTHROPLASTY (Right) as a surgical intervention.  The patient's history has been reviewed, patient examined, no change in status, stable for surgery.  I have reviewed the patient's chart and labs.  Questions were answered to the patient's satisfaction.     Mcarthur Rossetti

## 2022-09-08 NOTE — Progress Notes (Signed)
Notified Dr. Lissa Hoard that pt is in afib on the monitor and that pt has no history of afib. EKG ordered. Results show A fib. Dr. Lissa Hoard notified.

## 2022-09-08 NOTE — Interval H&P Note (Signed)
History and Physical Interval Note: After conferring with anesthesia, it was noted that the patient is in atrial fibrillation (A-fib).  This is new.  She has not had any documented A-fib before when reviewing her chart.  She is a patient of Dr. Alvester Chou who is her cardiologist.  Given this undocumented A-fib, it is better to cancel her surgery today in order to have her assessed by cardiology as an outpatient before rescheduling her for an elective joint replacement.  I explained this to her and her husband in detail.  She needs also contact her primary care physician.  09/08/2022 1:41 PM  Melissa Maldonado  has presented today for surgery, with the diagnosis of endstage osteoarthritis right knee.  The various methods of treatment have been discussed with the patient and family. After consideration of risks, benefits and other options for treatment, the patient has consented to  Procedure(s): RIGHT TOTAL KNEE ARTHROPLASTY (Right) as a surgical intervention.  The patient's history has been reviewed, patient examined, no change in status, stable for surgery.  I have reviewed the patient's chart and labs.  Questions were answered to the patient's satisfaction.     Mcarthur Rossetti

## 2022-09-08 NOTE — Progress Notes (Signed)
Pt's surgery cancelled due to new onset afib. Pt denied chest pain or shortnesss of breath. [name] and Dr. Lissa Hoard aware and came to see patient. Patient to call her PCP,Dr. Shelia Media, and get reestablished with Dr. Gwenlyn Found who she says she hasn't seen in a year. Pt d/c'd into care of husband. She stated that she would call Dr. Shelia Media and Dr. Gwenlyn Found to let them know that her surgery was cancelled because of new onset atrial fibrillation. Pt voiced understanding of the importance of contacting her doctor's immediately. She stated that she would. If she has any chest pain she was instructed to go the Emergency room. She agreed she would,but denies any symptoms at this time. IV D/c'd. Pt discharged.

## 2022-09-08 NOTE — Anesthesia Preprocedure Evaluation (Deleted)
Anesthesia Evaluation    Reviewed: Allergy & Precautions, Patient's Chart, lab work & pertinent test results, Unable to perform ROS - Chart review only  History of Anesthesia Complications Negative for: history of anesthetic complications  Airway Mallampati: II  TM Distance: >3 FB Neck ROM: Full    Dental  (+) Partial Upper, Dental Advisory Given, Missing   Pulmonary shortness of breath   breath sounds clear to auscultation       Cardiovascular hypertension, Pt. on medications pulmonary hypertension(-) angina + dysrhythmias Atrial Fibrillation  Rhythm:Regular Rate:Normal  Echo   1. Left ventricular ejection fraction, by estimation, is 60 to 65%. Left ventricular ejection fraction by 3D volume is 63 %. The left ventricle has normal function. The left ventricle has no regional wall motion abnormalities. There is mild concentric left ventricular hypertrophy. Left ventricular diastolic parameters are consistent with Grade II diastolic dysfunction (pseudonormalization). Elevated left atrial pressure. The average left ventricular global longitudinal strain is -22.3 %. The global longitudinal strain is normal.   2. Right ventricular systolic function is normal. The right ventricular size is normal. There is mildly elevated pulmonary artery systolic pressure. The estimated right ventricular systolic pressure is Q000111Q mmHg.   3. Left atrial size was mildly dilated.   4. The mitral valve is grossly normal. Trivial mitral valve regurgitation. No evidence of mitral stenosis.   5. The aortic valve is tricuspid. Aortic valve regurgitation is trivial. No aortic stenosis is present.   6. The inferior vena cava is dilated in size with >50% respiratory variability, suggesting right atrial pressure of 8 mmHg.     Neuro/Psych chronic back pain  negative psych ROS   GI/Hepatic Neg liver ROS,GERD  Controlled,,  Endo/Other    Morbid obesity   Renal/GU negative Renal ROS     Musculoskeletal  (+) Arthritis ,    Abdominal  (+) + obese  Peds  Hematology   Anesthesia Other Findings   Reproductive/Obstetrics                             Anesthesia Physical Anesthesia Plan  ASA: 3  Anesthesia Plan:    Post-op Pain Management: Tylenol PO (pre-op)* and Regional block*   Induction: Intravenous  PONV Risk Score and Plan: 3 and Ondansetron and Treatment may vary due to age or medical condition  Airway Management Planned:   Additional Equipment:   Intra-op Plan:   Post-operative Plan:   Informed Consent:   Plan Discussed with:   Anesthesia Plan Comments:         Anesthesia Quick Evaluation

## 2022-09-09 ENCOUNTER — Telehealth: Payer: Self-pay | Admitting: *Deleted

## 2022-09-09 NOTE — Telephone Encounter (Signed)
   Pre-operative Risk Assessment    Patient Name: Melissa Maldonado  DOB: 1940/08/19 MRN: [id number]  PT HAS APPT 09/14/22 WITH Melissa Montana, NP; I WILL FORWARD NOTES TO NP FOR UPCOMING APPT.   OUR OFFICE JUST RECEIVED CLEARANCE TODAY. LOOKS TO BE THAT SURGERY THAT WAS PLANNED FOR 09/14/22 HAS BEEN CANCELLED UNTIL THE PT HAS BEEN CLEARED BY CARDIOLOGY Request for Surgical Clearance    Procedure:   RIGHT TOTAL KNEE ARTHROPLASTY  Date of Surgery:  Clearance TBD; SURGERY WAS PLANNED FOR 09/14/22, [place] HAS BEEN CANCELLED UNTIL PT [name] BY CARIOLOGY                                 Surgeon:  DR. Jean Maldonado Surgeon's Group or Practice Name:  [facility] Phone number:  [phone] Fax number:  [phone] ATTN: SH[facility]   Type of Clearance Requested:   - Medical ; NO MEDICATIONS LISTED AS NEEDING TO BE HELD   Type of Anesthesia:  Spinal & BLOCK   Additional requests/questions:    Melissa Maldonado   09/09/2022, 10:23 AM

## 2022-09-11 ENCOUNTER — Ambulatory Visit (INDEPENDENT_AMBULATORY_CARE_PROVIDER_SITE_OTHER): Payer: Medicare Other | Admitting: Family

## 2022-09-11 ENCOUNTER — Encounter (HOSPITAL_BASED_OUTPATIENT_CLINIC_OR_DEPARTMENT_OTHER): Payer: Self-pay | Admitting: Family

## 2022-09-11 ENCOUNTER — Other Ambulatory Visit (INDEPENDENT_AMBULATORY_CARE_PROVIDER_SITE_OTHER): Payer: Medicare Other

## 2022-09-11 VITALS — BP 134/70 | HR 73 | Ht 65.0 in | Wt 197.0 lb

## 2022-09-11 DIAGNOSIS — M79605 Pain in left leg: Secondary | ICD-10-CM | POA: Diagnosis not present

## 2022-09-11 DIAGNOSIS — Z6832 Body mass index (BMI) 32.0-32.9, adult: Secondary | ICD-10-CM

## 2022-09-11 DIAGNOSIS — Z01818 Encounter for other preprocedural examination: Secondary | ICD-10-CM

## 2022-09-11 DIAGNOSIS — I10 Essential (primary) hypertension: Secondary | ICD-10-CM | POA: Diagnosis not present

## 2022-09-11 DIAGNOSIS — D6859 Other primary thrombophilia: Secondary | ICD-10-CM

## 2022-09-11 DIAGNOSIS — I480 Paroxysmal atrial fibrillation: Secondary | ICD-10-CM

## 2022-09-11 MED ORDER — RIVAROXABAN 15 MG PO TABS: 15.0000 mg | ORAL_TABLET | Freq: Every day | ORAL | 0 refills | Status: DC

## 2022-09-11 MED ORDER — RIVAROXABAN 15 MG PO TABS: 15.0000 mg | ORAL_TABLET | Freq: Every day | ORAL | 5 refills | Status: DC

## 2022-09-11 NOTE — Progress Notes (Unsigned)
Office Visit    Patient Name: Melissa Maldonado Date of Encounter: 09/12/2022  PCP:  [name], [facility]  Cardiologist:  Quay Burow, MD  Advanced Practice Provider:  No care team member to display Electrophysiologist:  None      Chief Complaint    [place] is a 82 y.o. female presents today for new onste atrial fibrillation   Past Medical History    Past Medical History:  Diagnosis Date   Arthritis    Chronic back pain    lumbar stenosis   Early cataracts, bilateral    GERD (gastroesophageal reflux disease)    occasionally but no meds required   History of blood transfusion    History of colon polyps    Hyperlipidemia    takes Lipitor every other day   Hypertension    takes losartan daily   Neuropathy    Osteoarthritis of left knee 04/27/2014   Restless leg    Shortness of breath    with exertion   SOB (shortness of breath) 2013   CPET   Tear of medial meniscus of left knee 09/08/2013   Past Surgical History:  Procedure Laterality Date   APPENDECTOMY     BACK SURGERY  07/20/2008   lumb fusion   COLONOSCOPY  2018   EYE SURGERY     both cataracts   KNEE ARTHROSCOPY WITH MEDIAL MENISECTOMY Left 09/08/2013   Procedure: LEFT KNEE ARTHROSCOPY WITH PARTIAL MEDIAL MENISCECTOMY;  Surgeon: [name], MD;  Location: [facility];  Service: Orthopedics;  Laterality: Left;   LUMBAR LAMINECTOMY/DECOMPRESSION MICRODISCECTOMY  05/13/2012   Procedure: LUMBAR LAMINECTOMY/DECOMPRESSION MICRODISCECTOMY 1 LEVEL;  Surgeon: [name], MD;  Location: [facility];  Service: Neurosurgery;  Laterality: Bilateral;  Bilateral Lumbar one -two Decompressive Laminectomy   PARTIAL KNEE ARTHROPLASTY Left 04/27/2014   Procedure: LEFT UNICOMPARTMENTAL KNEE;  Surgeon: [name], MD;  Location: [facility];  Service: Orthopedics;  Laterality: Left;   TUBAL LIGATION  1974   UPPER GI ENDOSCOPY       Allergies  Allergies  Allergen Reactions   Aspirin Other (See Comments)    "knocked out"   Vicks Vaporub [Liniments] Other (See Comments)    Makes her feel "out of it"   Codeine Nausea Only    History of Present Illness    Melissa Maldonado is a 82 y.o. female with a hx of hypertension, obesity last seen 10/22/20 by Dr. Gwenlyn Found.  Initially established with Dr. Gwenlyn Found for exertional dyspnea. Cardiac workup 01/2020 with coronary calcium score of 0. Echo 01/2020 normal LVEF 60-65%, no RWMA, mild LVH, gr2DD, mildly elevated PASP, trivial MR/AR. Last seen 10/22/20 with no dyspnea and BP at goal.  Presented for right total knee arthroplasty with [name] and was found to be in new onset atrial fibrillation.   She presents today with her husband. Pleasant lady from [place]. We reviewed the pathophysiology and treatment of atrial fibrillation in detail. No known family history of atrial fibrillation. Reports no shortness of breath nor dyspnea on exertion. Reports no chest pain, pressure, or tightness. No edema, orthopnea, PND. Reports no palpitations.  Notes her left calf hurts like a cramp first thing in the morning but does not hurt with ambulation. Exercise tolerance presently limited by right knee pain and hopeful to have clearance for clearance soon.   EKGs/Labs/Other Studies Reviewed:   The following studies were reviewed today:  Echo 02/07/20  1. Left ventricular ejection fraction, by estimation, is 60 to 65%. Left  ventricular ejection fraction by 3D volume is 63 %. The left ventricle has  normal function. The left ventricle has no regional wall motion  abnormalities. There is mild concentric  left ventricular hypertrophy. Left ventricular diastolic parameters are  consistent with Grade II diastolic dysfunction (pseudonormalization).  Elevated left atrial pressure. The average left ventricular global  longitudinal strain is -22.3 %. The global  longitudinal strain is normal.   2.  Right ventricular systolic function is normal. The right ventricular  size is normal. There is mildly elevated pulmonary artery systolic  pressure. The estimated right ventricular systolic pressure is Q000111Q mmHg.   3. Left atrial size was mildly dilated.   4. The mitral valve is grossly normal. Trivial mitral valve  regurgitation. No evidence of mitral stenosis.   5. The aortic valve is tricuspid. Aortic valve regurgitation is trivial.  No aortic stenosis is present.   6. The inferior vena cava is dilated in size with >50% respiratory  variability, suggesting right atrial pressure of 8 mmHg.   Coronary calcium score 01/2020 IMPRESSION: Coronary calcium score of 0.   EKG:  EKG is ordered today.  The ekg ordered today demonstrates rate controlled atrial fibrillation 73 bpm. Rhythm strip shows heart rate range of 67-80 bpm.   Recent Labs: 09/02/2022: ALT 14; BUN 19; Creatinine, Ser 1.49; Hemoglobin 13.9; Platelets 303; Potassium 3.8; Sodium 138  Recent Lipid Panel No results found for: "CHOL", "TRIG", "HDL", "CHOLHDL", "VLDL", "LDLCALC", "LDLDIRECT"  Risk Assessment/Calculations:   CHA2DS2-VASc Score = 4   This indicates a 4.8% annual risk of stroke. The patient's score is based upon: CHF History: 0 HTN History: 1 Diabetes History: 0 Stroke History: 0 Vascular Disease History: 0 Age Score: 2 Gender Score: 1     Home Medications   Current Meds  Medication Sig   acetaminophen (TYLENOL 8 HOUR) 650 MG CR tablet Take 1,300 mg by mouth daily.   amLODipine (NORVASC) 5 MG tablet Take 5 mg by mouth daily.   Cholecalciferol (VITAMIN D) 50 MCG (2000 UT) tablet Take 2,000 Units by mouth daily.   dorzolamide (TRUSOPT) 2 % ophthalmic solution Place 1 drop into the left eye 2 (two) times daily.   OVER THE COUNTER MEDICATION Apply 1 Application topically at bedtime as needed (pain). Magnesium Balm   Rivaroxaban (XARELTO) 15 MG TABS tablet Take 1 tablet (15 mg total) by mouth daily with  supper.   Rivaroxaban (XARELTO) 15 MG TABS tablet Take 1 tablet (15 mg total) by mouth daily with supper.   rOPINIRole (REQUIP) 0.5 MG tablet Take 0.5 mg by mouth at bedtime.   WEGOVY 2.4 MG/0.75ML SOAJ Inject 2.4 mg into the skin every Sunday.    Review of Systems      All other systems reviewed and are otherwise negative except as noted above.  Physical Exam    VS:  BP 134/70   Pulse 73   Ht '5\' 5"'$  (1.651 m)   Wt 197 lb (89.4 kg)   BMI 32.78 kg/m  , BMI Body mass index is 32.78 kg/m.  Wt Readings from Last 3 Encounters:  09/11/22 197 lb (89.4 kg)  09/08/22 194 lb (88 kg)  09/02/22 194 lb (88 kg)    GEN: Well nourished, overweight, well developed, in no acute distress. HEENT: normal. Neck: Supple, no JVD, carotid bruits, or masses. Cardiac: IRIR, no murmurs, rubs, or gallops. No clubbing, cyanosis, edema.  Radials/PT 2+ and  equal bilaterally.  Respiratory:  Respirations regular and unlabored, clear to auscultation bilaterally. GI: Soft, nontender, nondistended. MS: No deformity or atrophy. Skin: Warm and dry, no rash. Neuro:  Strength and sensation are intact. Psych: Normal affect.  Assessment & Plan    Atrial fibrillation - New onset noted at time of right knee surgery which was subsequently cancelled. Unaware of atrial fibrillation with no palpitations, dyspnea. Rate controlled today without AV nodal blocking agent. 7 day ZIO placed in clinic to assess burden, rate control. Echocardiogram to rule out new HFrEF or valvular abnormality. CBC/CMP 09/02/22 unremarkable. Plan for thyroid panel, magnesium. If echo with no evidence of strain, could consider rate control. Re-discuss at follow up.  CHA2DS2-VASc Score = 4 [CHF History: 0, HTN History: 1, Diabetes History: 0, Stroke History: 0, Vascular Disease History: 0, Age Score: 2, Gender Score: 1].  Therefore, the patient's annual risk of stroke is 4.8 %.    CrCl 43 mL/min. Start Xarelto '15mg'$  QD. 2 weeks of samples and free 30-day  coupon provided in clinic.   Left leg cramp- wakes with left leg cramp at calf. 09/02/22 K 3.8. Plan for magnesium level.   HTN - BP well controlled. Continue current antihypertensive regimen Amlodipine '5mg'$  QD.   Preoperative clearance - Pending right total knee arthroscopy. Anticipate if echocardiogram reveals no sudden reduced EF or significant valvular abnormalities needing correction that she could undergo surgery. Can obtain >4 METS. Will route to pharmacy team for input on holding Xarelto. Clearance to be provided pending echo results which is scheduled for 09/28/22.  Obesity / BMI 32 - On Wegovy per PCP. Weight loss via diet and exercise encouraged. Discussed the impact being overweight would have on cardiovascular risk.         Disposition: Follow up in 6 week(s) with Quay Burow, MD or APP. Update CBC at that time for monitoring on Xarelto.   Signed, Loel Dubonnet, NP 09/12/2022, 6:13 PM [facility]

## 2022-09-11 NOTE — Patient Instructions (Addendum)
Medication Instructions:  Your physician has recommended you make the following change in your medication:   START Xarelto '15mg'$  daily with your evening meal  *If you need a refill on your cardiac medications before your next appointment, please call your pharmacy*   Lab Work: Your physician recommends that you return for lab work today: thyroid panel, magnesium on the day of your echocardiogram or at your convenience  Please return for Lab work. You may come to the...   [facility] (3rd floor) [address], [place], [place] [address]  Open: 8am-Noon and 1pm-4:30pm  Please ring the doorbell on the small table when you exit the elevator and the Lab Tech will come get you  [place] [address] [place], [place], [place] [address] Open: 8am-1pm, then 2pm-4:30pm   [place]- Please see attached locations sheet stapled to your lab work with address and hours.    If you have labs (blood work) drawn today and your tests are completely normal, you will receive your results only by: [place] (if you have MyChart) OR A paper copy in the mail If you have any lab test that is abnormal or we need to change your treatment, we will call you to review the results.   Testing/Procedures: Your EKG today showed rate controlled atrial fibrillation.   Your physician has requested that you have an echocardiogram. Echocardiography is a painless test that uses sound waves to create images of your heart. It provides your doctor with information about the size and shape of your heart and how well your heart's chambers and valves are working. This procedure takes approximately one hour. There are no restrictions for this procedure. Please do NOT wear cologne, perfume, aftershave, or lotions (deodorant is allowed). Please arrive 15 minutes prior to your appointment time  Your physician has recommended that you wear a Zio monitor.   This  monitor is a medical device that records the heart's electrical activity. Doctors most often use these monitors to diagnose arrhythmias. Arrhythmias are problems with the speed or rhythm of the heartbeat. The monitor is a small device applied to your chest. You can wear one while you do your normal daily activities. While wearing this monitor if you have any symptoms to push the button and record what you felt. Once you have worn this monitor for the period of time provider prescribed (for 7 days), you will return the monitor device in the postage paid box. Once it is returned they will download the data collected and provide [place] with a report which the provider will then review and we will call you with those results. Important tips:  Avoid showering during the first 24 hours of wearing the monitor. Avoid excessive sweating to help maximize wear time. Do not submerge the device, no hot tubs, and no swimming pools. Keep any lotions or oils away from the patch. After 24 hours you may shower with the patch on. Take brief showers with your back facing the shower head.  Do not remove patch once it has been placed because that will interrupt data and decrease adhesive wear time. Push the button when you have any symptoms and write down what you were feeling. Once you have completed wearing your monitor, remove and place into box which has postage paid and place in your outgoing mailbox.  If for some reason you have misplaced your box then call our office and we can provide another box and/or mail it  off for you.  Follow-Up: At [facility], you and your health needs are our priority.  As part of our continuing mission to provide you with exceptional heart care, we have created designated Provider Care Teams.  These Care Teams include your primary Cardiologist (physician) and Advanced Practice Providers (APPs -  Physician Assistants and Nurse Practitioners) who all work together to provide you with  the care you need, when you need it.  We recommend signing up for the patient portal called "MyChart".  Sign up information is provided on this After Visit Summary.  MyChart is used to connect with patients for Virtual Visits (Telemedicine).  Patients are able to view lab/test results, encounter notes, upcoming appointments, etc.  Non-urgent messages can be sent to your provider as well.   To learn more about what you can do with MyChart, go to [email or web address].    Your next appointment:   6 weeks  Provider:   Quay Burow, MD   or Advanced Practice Provider  Other Instructions  Atrial Fibrillation Atrial fibrillation (AFib) is a type of heartbeat that is irregular or fast. If you have AFib, your heart beats without any order. This makes it hard for your heart to pump blood in a normal way. AFib may come and go, or it may become a long-lasting problem. If AFib is not treated, it can put you at higher risk for stroke, heart failure, and other heart problems. What are the causes? AFib may be caused by diseases that damage the heart's electrical system. They include: High blood pressure. Heart failure. Heart valve diseases. Heart surgery. Diabetes. Thyroid disease. Kidney disease. Lung diseases, such as pneumonia or COPD. Sleep apnea. Sometimes the cause is not known. What increases the risk? You are more likely to develop AFib if: You are older. You exercise often and very hard. You have a family history of AFib. You are female. You are Caucasian. You are overweight. You smoke. You drink a lot of alcohol. What are the signs or symptoms? Common symptoms of this condition include: A feeling that your heart is beating very fast. Chest pain or discomfort. Feeling short of breath. Suddenly feeling light-headed or weak. Getting tired easily during activity. Fainting. Sweating. In some cases, there are no symptoms. How is this treated? Medicines to: Prevent blood  clots. Treat heart rate or heart rhythm problems. Using devices, such as a pacemaker, to correct heart rhythm problems. Doing surgery to remove the part of the heart that sends bad signals. Closing an area where clots can form in the heart (left atrial appendage). In some cases, your doctor will treat other underlying conditions. Follow these instructions at home: Medicines Take over-the-counter and prescription medicines only as told by your doctor. Do not take any new medicines without first talking to your doctor. If you are taking blood thinners: Talk with your doctor before taking aspirin or NSAIDs, such as ibuprofen. Take your medicines as told. Take them at the same time each day. Do not do things that could hurt or bruise you. Be careful to avoid falls. Wear an alert bracelet or carry a card that says you take blood thinners. Lifestyle Do not smoke or use any products that contain nicotine or tobacco. If you need help quitting, ask your doctor. Eat heart-healthy foods. Talk with your doctor about the right eating plan for you. Exercise regularly as told by your doctor. Do not drink alcohol. Lose weight if you are overweight. General instructions If you have sleep apnea,  treat it as told by your doctor. Do not use diet pills unless your doctor says they are safe for you. Diet pills may make heart problems worse. Keep all follow-up visits. Your doctor will check your heart rate and rhythm regularly. Contact a doctor if: You notice a change in the speed, rhythm, or strength of your heartbeat. You are taking a blood-thinning medicine and you get more bruising. You get tired more easily when you move or exercise. You have a sudden change in weight. Get help right away if:  You have pain in your chest. You have trouble breathing. You have side effects of blood thinners, such as blood in your vomit, poop (stool), or pee (urine), or bleeding that cannot stop. You have any signs of  a stroke. "BE FAST" is an easy way to remember the main warning signs: B - Balance. Dizziness, sudden trouble walking, or loss of balance. E - Eyes. Trouble seeing or a change in how you see. F - Face. Sudden weakness or loss of feeling in the face. The face or eyelid may droop on one side. A - Arms.Weakness or loss of feeling in an arm. This happens suddenly and usually on one side of the body. S - Speech. Sudden trouble speaking, slurred speech, or trouble understanding what people say. T - Time.Time to call emergency services. Write down what time symptoms started. You have other signs of a stroke, such as: A sudden, very bad headache with no known cause. Feeling like you may vomit (nausea). Vomiting. A seizure. These symptoms may be an emergency. Get help right away. Call 911. Do not wait to see if the symptoms will go away. Do not drive yourself to the hospital. This information is not intended to replace advice given to you by your health care provider. Make sure you discuss any questions you have with your health care provider. Document Revised: 03/25/2022 Document Reviewed: 03/25/2022 Elsevier Patient Education  [employer].

## 2022-09-12 ENCOUNTER — Encounter (HOSPITAL_BASED_OUTPATIENT_CLINIC_OR_DEPARTMENT_OTHER): Payer: Self-pay | Admitting: Family

## 2022-09-14 ENCOUNTER — Ambulatory Visit (HOSPITAL_BASED_OUTPATIENT_CLINIC_OR_DEPARTMENT_OTHER): Payer: Medicare Other | Admitting: Family

## 2022-09-14 NOTE — Telephone Encounter (Signed)
Covering preop today. Had formal OV 2/23 with Laurann Montana to review preop clearance, pending echocardiogram. Will route to [facility] so she is aware when providing final clearance recs. Will otherwise remove from preop box as separate preop APP input not needed at this time.

## 2022-09-14 NOTE — Addendum Note (Signed)
Addended by: Ernie Hew D on: 09/14/2022 08:32 AM   Modules accepted: Orders

## 2022-09-14 NOTE — Telephone Encounter (Signed)
Patient with diagnosis of new onset afib started on Xarelto '15mg'$  daily 09/11/22 for anticoagulation.    Procedure: right TKA Date of procedure: TBD  CHA2DS2-VASc Score = 4  This indicates a 4.8% annual risk of stroke. The patient's score is based upon: CHF History: 0 HTN History: 1 Diabetes History: 0 Stroke History: 0 Vascular Disease History: 0 Age Score: 2 Gender Score: 1   CrCl 63m/min  Platelet count 303K  Pt still completing cardiac workup after new diagnosis of afib found at the time of her previously planned TKA. Plans for 7 day Zio monitor, echo, thyroid and Mg levels. Per cards note, cardiac clearance to be done once pt has echo on 09/28/22. If HF were found, would change her CHADS2VASC score but would not change anticoag hold recommendation. Once cardiac clearance is given, patient can hold Xarelto for 3 days prior to procedure.    **This guidance is not considered finalized until pre-operative APP has relayed final recommendations.**

## 2022-09-21 ENCOUNTER — Encounter: Payer: Medicare Other | Admitting: Orthopaedic Surgery

## 2022-09-24 ENCOUNTER — Encounter: Payer: Self-pay | Admitting: Radiology

## 2022-09-28 ENCOUNTER — Ambulatory Visit (HOSPITAL_COMMUNITY): Payer: Medicare Other | Attending: Family

## 2022-09-28 DIAGNOSIS — I480 Paroxysmal atrial fibrillation: Secondary | ICD-10-CM | POA: Diagnosis not present

## 2022-09-28 LAB — ECHOCARDIOGRAM COMPLETE: S' Lateral: 2.2 cm

## 2022-09-29 DIAGNOSIS — I480 Paroxysmal atrial fibrillation: Secondary | ICD-10-CM | POA: Diagnosis not present

## 2022-10-01 ENCOUNTER — Telehealth: Payer: Self-pay | Admitting: Cardiovascular Disease

## 2022-10-01 NOTE — Telephone Encounter (Signed)
Spoke with pt regarding the results of her echocardiogram and monitor.   Loel Dubonnet, NP 10/01/2022 11:23 AM EDT     Echocardiogram with normal heart pumping function.  No significant valvular abnormalities.  No abnormality that would cause atrial fibrillation.  No evidence of heart failure.   She may proceed with knee surgery.  I have updated [name].  She may hold Xarelto 3 days prior to planned procedure.   Monitor did show atrial fibrillation occurring 100% of the time. She is protected from stroke with Xarelto. We will discuss further at follow up in April.   All questions answered and pt verbalizes understanding.

## 2022-10-01 NOTE — Telephone Encounter (Signed)
Pt was calling to f/u on Echo from 3/11 and stated that she would like a call when results are in and read. Please advise.

## 2022-10-01 NOTE — Telephone Encounter (Signed)
   Patient Name: Melissa Maldonado  DOB: 1940-11-01 MRN: [id number]  Primary Cardiologist: Quay Burow, MD  Chart reviewed as part of pre-operative protocol coverage.   [name] was seen in clinic 09/11/22 with asymptomatic rate controlled atrial fibrillation which was new onset. She was started on Xarelto 15mg  QD due to CHA2DS2-VASc Score = 4 [CHF History: 0, HTN History: 1, Diabetes History: 0, Stroke History: 0, Vascular Disease History: 0, Age Score: 2, Gender Score: 1].  Therefore, the patient's annual risk of stroke is 4.8 %.  Echocardiogram 09/28/22 with normal LVEF, no significant valvular abnormalities. No evidence of new HF nor RWMA.   As asymptomatic in regards to her atrial fibrillation and echocardiogram unrevealing, based on ACC/AHA guidelines, Melissa Maldonado is at acceptable risk for the planned procedure without further cardiovascular testing. She may hold Xarelto 3 days prior to planned procedure.   The patient was advised that if she develops new symptoms prior to surgery to contact our office to arrange for a follow-up visit, and she verbalized understanding.  I will route this recommendation to the requesting party via Epic fax function.  Please call with questions.  Loel Dubonnet, NP 10/01/2022, 11:25 AM

## 2022-10-02 ENCOUNTER — Telehealth (HOSPITAL_BASED_OUTPATIENT_CLINIC_OR_DEPARTMENT_OTHER): Payer: Self-pay

## 2022-10-02 NOTE — Telephone Encounter (Addendum)
Seen by patient Melissa Maldonado on 10/01/2022  7:40 PM; follow up mychart message sent to patient.     ----- Message from Loel Dubonnet, NP sent at 10/01/2022  5:16 PM EDT ----- Heart monitor with atrial fibrillation 100% of the time. Heart rate overall well controlled. Would recommend initiation of Metoprolol tartrate as needed for heart rate persistently more than 110bpm at rest (take if elevated heart rate for more than 15 minutes at rest).   Discussed with Dr. Gwenlyn Found - recommend referral to the [facility] as well to discuss further treatment of atrial fibrillation such as anti arrhythmic medications. If she prefers to cancel her appointment with me that is fine but also okay to keep both appointments.

## 2022-10-06 NOTE — Telephone Encounter (Signed)
Spoke with patient and rescheduled surgery.

## 2022-10-08 ENCOUNTER — Encounter: Payer: Self-pay | Admitting: Podiatry

## 2022-10-08 ENCOUNTER — Ambulatory Visit (INDEPENDENT_AMBULATORY_CARE_PROVIDER_SITE_OTHER): Payer: Medicare Other | Admitting: Podiatry

## 2022-10-08 DIAGNOSIS — M7751 Other enthesopathy of right foot: Secondary | ICD-10-CM | POA: Diagnosis not present

## 2022-10-08 DIAGNOSIS — M7752 Other enthesopathy of left foot: Secondary | ICD-10-CM | POA: Diagnosis not present

## 2022-10-08 DIAGNOSIS — L600 Ingrowing nail: Secondary | ICD-10-CM

## 2022-10-08 MED ORDER — TRIAMCINOLONE ACETONIDE 10 MG/ML IJ SUSP
20.0000 mg | Freq: Once | INTRAMUSCULAR | Status: AC
  Administered 2022-10-08: 20 mg

## 2022-10-08 NOTE — Patient Instructions (Signed)

## 2022-10-08 NOTE — Progress Notes (Signed)
Subjective:   Patient ID: Melissa Maldonado, female   DOB: 82 y.o.   MRN: [id number]   HPI Patient presents concerned about ingrown toenails of both big toes and has a lot of pain around the big toe joint both feet right over left.  States that it is hard for her to do a lot of walking with this and she is due to have a knee replacement right   ROS      Objective:  Physical Exam  Neurovascular status intact with distal thickening of the nailbeds bilateral and incurvation medial lateral borders bilateral with inflammation pain around the first MPJ right over left with moderate reduction joint motion     Assessment:  Moderate ingrown toenail deformity bilateral with nail thickness and inflammation capsulitis of the first MPJ bilateral     Plan:  H&P all conditions reviewed sterile prep and injected the first MPJ capsule periarticular 3 mg Kenalog 5 mg Xylocaine debrided nailbeds and do not recommend removal of the nailbeds but could be necessary in the future if symptoms persist and educated her on that condition.  Patient will be seen back as needed

## 2022-10-18 DIAGNOSIS — I10 Essential (primary) hypertension: Secondary | ICD-10-CM | POA: Diagnosis not present

## 2022-10-18 DIAGNOSIS — E7800 Pure hypercholesterolemia, unspecified: Secondary | ICD-10-CM | POA: Diagnosis not present

## 2022-10-18 DIAGNOSIS — N1832 Chronic kidney disease, stage 3b: Secondary | ICD-10-CM | POA: Diagnosis not present

## 2022-10-18 DIAGNOSIS — M159 Polyosteoarthritis, unspecified: Secondary | ICD-10-CM | POA: Diagnosis not present

## 2022-10-19 DIAGNOSIS — H401221 Low-tension glaucoma, left eye, mild stage: Secondary | ICD-10-CM | POA: Diagnosis not present

## 2022-10-19 DIAGNOSIS — H40011 Open angle with borderline findings, low risk, right eye: Secondary | ICD-10-CM | POA: Diagnosis not present

## 2022-10-23 NOTE — Progress Notes (Signed)
Sent message, via epic in basket, requesting orders in epic from surgeon.  

## 2022-10-25 NOTE — Progress Notes (Unsigned)
Office Visit    Patient Name: Melissa Maldonado Date of Encounter: 10/26/2022  PCP:  Merri Brunette, MD   [facility]  Cardiologist:  Nanetta Batty, MD  Advanced Practice Provider:  No care team member to display Electrophysiologist:  None      Chief Complaint    Melissa Maldonado is a 82 y.o. female presents today for atrial fibrillation follow up.  Past Medical History    Past Medical History:  Diagnosis Date   Arthritis    Chronic back pain    lumbar stenosis   Early cataracts, bilateral    GERD (gastroesophageal reflux disease)    occasionally but no meds required   History of blood transfusion    History of colon polyps    Hyperlipidemia    takes Lipitor every other day   Hypertension    takes losartan daily   Neuropathy    Osteoarthritis of left knee 04/27/2014   Restless leg    Shortness of breath    with exertion   SOB (shortness of breath) 2013   CPET   Tear of medial meniscus of left knee 09/08/2013   Past Surgical History:  Procedure Laterality Date   APPENDECTOMY     BACK SURGERY  07/20/2008   lumb fusion   COLONOSCOPY  2018   EYE SURGERY     both cataracts   KNEE ARTHROSCOPY WITH MEDIAL MENISECTOMY Left 09/08/2013   Procedure: LEFT KNEE ARTHROSCOPY WITH PARTIAL MEDIAL MENISCECTOMY;  Surgeon: Eulas Post, MD;  Location: [facility];  Service: Orthopedics;  Laterality: Left;   LUMBAR LAMINECTOMY/DECOMPRESSION MICRODISCECTOMY  05/13/2012   Procedure: LUMBAR LAMINECTOMY/DECOMPRESSION MICRODISCECTOMY 1 LEVEL;  Surgeon: Barnett Abu, MD;  Location: [facility];  Service: Neurosurgery;  Laterality: Bilateral;  Bilateral Lumbar one -two Decompressive Laminectomy   PARTIAL KNEE ARTHROPLASTY Left 04/27/2014   Procedure: LEFT UNICOMPARTMENTAL KNEE;  Surgeon: Eulas Post, MD;  Location: [facility];  Service: Orthopedics;  Laterality: Left;   TUBAL LIGATION  1974   UPPER GI ENDOSCOPY       Allergies  Allergies  Allergen Reactions   Aspirin Other (See Comments)    "knocked out"   Vicks Vaporub [Liniments] Other (See Comments)    Makes her feel "out of it"   Codeine Nausea Only    History of Present Illness    Melissa Maldonado is a 82 y.o. female with a hx of hypertension, obesity last seen 09/11/22.  Initially established with Dr. Allyson Sabal for exertional dyspnea. Cardiac workup 01/2020 with coronary calcium score of 0. Echo 01/2020 normal LVEF 60-65%, no RWMA, mild LVH, gr2DD, mildly elevated PASP, trivial MR/AR. Last seen 10/22/20 with no dyspnea and BP at goal.  Presented for right total knee arthroplasty with Dr. Magnus Ivan and was found to be in new onset atrial fibrillation. Seen in follow up 09/11/22 asymptomatic in regard to atrial fibrillation. Xarelto initiated and ZIO with 100% atrial fibrillation burden with one 4.3 second pause and heart rate 44-185 bpm. Echocardiogram 09/28/22 with normal LVEF, no significant valvular abnormalities.   She presents today independently. Pleasant lady from [place].  We reviewed echocardiogram and monitor results in detail.  Reports no palpitations, lightheadedness, dizziness, near-syncope, syncope, chest pain.  She is interested in pursuing rate control option for her atrial fibrillation.  Concern by $45 per month cost of Xarelto-will initiate assistance paperwork.  CrCl 43 mL/min   EKGs/Labs/Other Studies Reviewed:   The following studies were reviewed  today:  Echo 02/07/20   1. Left ventricular ejection fraction, by estimation, is 60 to 65%. Left  ventricular ejection fraction by 3D volume is 63 %. The left ventricle has  normal function. The left ventricle has no regional wall motion  abnormalities. There is mild concentric  left ventricular hypertrophy. Left ventricular diastolic parameters are  consistent with Grade II diastolic dysfunction (pseudonormalization).  Elevated left atrial pressure. The average left ventricular  global  longitudinal strain is -22.3 %. The global  longitudinal strain is normal.   2. Right ventricular systolic function is normal. The right ventricular  size is normal. There is mildly elevated pulmonary artery systolic  pressure. The estimated right ventricular systolic pressure is 35.5 mmHg.   3. Left atrial size was mildly dilated.   4. The mitral valve is grossly normal. Trivial mitral valve  regurgitation. No evidence of mitral stenosis.   5. The aortic valve is tricuspid. Aortic valve regurgitation is trivial.  No aortic stenosis is present.   6. The inferior vena cava is dilated in size with >50% respiratory  variability, suggesting right atrial pressure of 8 mmHg.   Coronary calcium score 01/2020 IMPRESSION: Coronary calcium score of 0.   EKG:  EKG is not ordered today.  Recent Labs: 09/02/2022: ALT 14; BUN 19; Creatinine, Ser 1.49; Hemoglobin 13.9; Platelets 303; Potassium 3.8; Sodium 138  Recent Lipid Panel No results found for: "CHOL", "TRIG", "HDL", "CHOLHDL", "VLDL", "LDLCALC", "LDLDIRECT"  Risk Assessment/Calculations:   CHA2DS2-VASc Score = 4   This indicates a 4.8% annual risk of stroke. The patient's score is based upon: CHF History: 0 HTN History: 1 Diabetes History: 0 Stroke History: 0 Vascular Disease History: 0 Age Score: 2 Gender Score: 1     Home Medications   Current Meds  Medication Sig   acetaminophen (TYLENOL 8 HOUR) 650 MG CR tablet Take 1,300 mg by mouth See admin instructions. Take 1300 mg daily, may take a second 1300 mg dose as needed for pain   amLODipine (NORVASC) 5 MG tablet Take 5 mg by mouth daily.   Cholecalciferol (VITAMIN D) 50 MCG (2000 UT) tablet Take 2,000 Units by mouth daily.   dorzolamide (TRUSOPT) 2 % ophthalmic solution Place 1 drop into the left eye 2 (two) times daily.   rOPINIRole (REQUIP) 0.5 MG tablet Take 0.5 mg by mouth at bedtime.   WEGOVY 2.4 MG/0.75ML SOAJ Inject 2.4 mg into the skin every Sunday.    [DISCONTINUED] Rivaroxaban (XARELTO) 15 MG TABS tablet Take 1 tablet (15 mg total) by mouth daily with supper.    Review of Systems      All other systems reviewed and are otherwise negative except as noted above.  Physical Exam    VS:  BP 128/80   Pulse 90   Ht 5\' 5"  (1.651 m)   Wt 197 lb (89.4 kg)   BMI 32.78 kg/m  , BMI Body mass index is 32.78 kg/m.  Wt Readings from Last 3 Encounters:  10/26/22 197 lb (89.4 kg)  09/11/22 197 lb (89.4 kg)  09/08/22 194 lb (88 kg)    GEN: Well nourished, overweight, well developed, in no acute distress. HEENT: normal. Neck: Supple, no JVD, carotid bruits, or masses. Cardiac: IRIR, no murmurs, rubs, or gallops. No clubbing, cyanosis, edema.  Radials/PT 2+ and equal bilaterally.  Respiratory:  Respirations regular and unlabored, clear to auscultation bilaterally. GI: Soft, nontender, nondistended. MS: No deformity or atrophy. Skin: Warm and dry, no rash. Neuro:  Strength and sensation  are intact. Psych: Normal affect.  Assessment & Plan    Atrial fibrillation - New onset noted at time of right knee surgery which was subsequently cancelled. Unaware of atrial fibrillation with no palpitations, dyspnea.  Echo 09/28/22 normal LVEF, no significant valvular abnormalities.  Monitor 09/21/2022 with 100% atrial fibrillation burden 44-185 bpm with average rate of 86 bpm and one pause lasting 4.3 seconds. Plan for thyroid panel, magnesium.  CHA2DS2-VASc Score = 4 [CHF History: 0, HTN History: 1, Diabetes History: 0, Stroke History: 0, Vascular Disease History: 0, Age Score: 2, Gender Score: 1].  Therefore, the patient's annual risk of stroke is 4.8 %.    CrCl 43 mL/min.  Continue Xarelto 15mg  QD.  Patient assistance paperwork provided. Will have her follow-up with atrial fibrillation clinic after her upcoming surgery to discuss long-term management of atrial fibrillation.  She may like to proceed with rate control but recommend discussing AAD or ablation at  least once.  HTN - BP well controlled. Continue current antihypertensive regimen Amlodipine 5mg  QD.   Preoperative clearance - Pending right total knee arthroscopy 11/06/22. Clearance previously provided. Can obtain >4 METS. May hold Xarelto 3 days prior to procedure. Per AHA/ACC guidelines, she is deemed acceptable risk for the planned procedure without additional cardiovascular testing.    Obesity / BMI 32 - On [place] per PCP. Weight loss via diet and exercise encouraged. Discussed the impact being overweight would have on cardiovascular risk.         Disposition: Follow up in 3 months with Atrial Fibrillation clinic and in 6-8 months with with Nanetta BattyJonathan Berry, MD or APP.   Signed, Alver Sorrowaitlin S Cinthia Rodden, NP 10/26/2022, 10:29 AM [facility]

## 2022-10-26 ENCOUNTER — Encounter (HOSPITAL_BASED_OUTPATIENT_CLINIC_OR_DEPARTMENT_OTHER): Payer: Self-pay | Admitting: Family

## 2022-10-26 ENCOUNTER — Other Ambulatory Visit: Payer: Self-pay | Admitting: Physician Assistant

## 2022-10-26 ENCOUNTER — Ambulatory Visit (INDEPENDENT_AMBULATORY_CARE_PROVIDER_SITE_OTHER): Payer: Medicare Other | Admitting: Family

## 2022-10-26 VITALS — BP 128/80 | HR 90 | Ht 65.0 in | Wt 197.0 lb

## 2022-10-26 DIAGNOSIS — I10 Essential (primary) hypertension: Secondary | ICD-10-CM

## 2022-10-26 DIAGNOSIS — D6859 Other primary thrombophilia: Secondary | ICD-10-CM

## 2022-10-26 DIAGNOSIS — I480 Paroxysmal atrial fibrillation: Secondary | ICD-10-CM

## 2022-10-26 DIAGNOSIS — I4819 Other persistent atrial fibrillation: Secondary | ICD-10-CM | POA: Diagnosis not present

## 2022-10-26 DIAGNOSIS — Z01818 Encounter for other preprocedural examination: Secondary | ICD-10-CM

## 2022-10-26 DIAGNOSIS — Z6832 Body mass index (BMI) 32.0-32.9, adult: Secondary | ICD-10-CM

## 2022-10-26 MED ORDER — RIVAROXABAN 15 MG PO TABS: 15.0000 mg | ORAL_TABLET | Freq: Every day | ORAL | 3 refills | Status: DC

## 2022-10-26 NOTE — Patient Instructions (Signed)
DUE TO COVID-19 ONLY TWO VISITORS  (aged 516 and older)  ARE ALLOWED TO COME WITH YOU AND STAY IN THE WAITING ROOM ONLY DURING PRE OP AND PROCEDURE.   **NO VISITORS ARE ALLOWED IN THE SHORT STAY AREA OR RECOVERY ROOM!!**  IF YOU WILL BE ADMITTED INTO THE HOSPITAL YOU ARE ALLOWED ONLY FOUR SUPPORT PEOPLE DURING VISITATION HOURS ONLY (7 AM -8PM)   The support person(s) must pass our screening, gel in and out, and wear a mask at all times, including in the patient's room. Patients must also wear a mask when staff or their support person are in the room. Visitors GUEST BADGE MUST BE WORN VISIBLY  One adult visitor may remain with you overnight and MUST be in the room by 8 P.M.     Your procedure is scheduled on: 11/06/22   [facility]    Report to admitting at : 7:45 AM   Call this number if you have problems the morning of surgery [phone]   Do not eat food :After Midnight.   After Midnight you may have the following liquids until : 7:00 AM DAY OF SURGERY  Water Black Coffee (sugar ok, NO MILK/CREAM OR CREAMERS)  Tea (sugar ok, NO MILK/CREAM OR CREAMERS) regular and decaf                             Plain Jell-O (NO RED)                                           Fruit ices (not with fruit pulp, NO RED)                                     Popsicles (NO RED)                                                                  Juice: apple, WHITE grape, WHITE cranberry Sports drinks like Gatorade (NO RED)    The day of surgery:  Drink ONE (1) Pre-Surgery Clear G2 at: 7:00 AM the morning of surgery. Drink in one sitting. Do not sip.  This drink was given to you during your hospital  pre-op appointment visit. Nothing else to drink after completing the  Pre-Surgery Clear Ensure or G2.          If you have questions, please contact your surgeon's office.  Oral Hygiene is also important to reduce your risk of infection.                                     Remember - BRUSH YOUR TEETH THE MORNING OF SURGERY WITH YOUR REGULAR TOOTHPASTE  DENTURES WILL BE REMOVED PRIOR TO SURGERY PLEASE DO NOT APPLY "Poly grip" OR ADHESIVES!!!   Do NOT smoke after Midnight   Take these medicines the morning of surgery with A SIP OF WATER: amlodipine.Tylenol as needed.  How to Manage Your Diabetes  Before and After Surgery  Why is it important to control my blood sugar before and after surgery? Improving blood sugar levels before and after surgery helps healing and can limit problems. A way of improving blood sugar control is eating a healthy diet by:  Eating less sugar and carbohydrates  Increasing activity/exercise  Talking with your doctor about reaching your blood sugar goals High blood sugars (greater than 180 mg/dL) can raise your risk of infections and slow your recovery, so you will need to focus on controlling your diabetes during the weeks before surgery. Make sure that the doctor who takes care of your diabetes knows about your planned surgery including the date and location.  How do I manage my blood sugar before surgery? Check your blood sugar at least 4 times a day, starting 2 days before surgery, to make sure that the level is not too high or low. Check your blood sugar the morning of your surgery when you wake up and every 2 hours until you get to the Short Stay unit. If your blood sugar is less than 70 mg/dL, you will need to treat for low blood sugar: Do not take insulin. Treat a low blood sugar (less than 70 mg/dL) with  cup of clear juice (cranberry or apple), 4 glucose tablets, OR glucose gel. Recheck blood sugar in 15 minutes after treatment (to make sure it is greater than 70 mg/dL). If your blood sugar is not greater than 70 mg/dL on recheck, call [phone] for further instructions. Report your blood sugar to the short stay nurse when you get to Short Stay.  If you are admitted to the hospital after surgery: Your blood sugar will  be checked by the staff and you will probably be given insulin after surgery (instead of oral diabetes medicines) to make sure you have good blood sugar levels. The goal for blood sugar control after surgery is 80-180 mg/dL.   WHAT DO I DO ABOUT MY DIABETES MEDICATION?    THE MORNING OF SURGERY, DO NOT TAKE ANY ORAL DIABETIC MEDICATIONS DAY OF YOUR SURGERY  DO NOT TAKE THE FOLLOWING 7 DAYS PRIOR TO SURGERY: Ozempic, Wegovy, Rybelsus (Semaglutide), Byetta (exenatide), Bydureon (exenatide ER), Victoza, Saxenda (liraglutide), or Trulicity (dulaglutide) Mounjaro (Tirzepatide) Adlyxin (Lixisenatide), Polyethylene Glycol Loxenatide.                              You may not have any metal on your body including hair pins, jewelry, and body piercing            Do not wear make-up, lotions, powders, perfumes/cologne, or deodorant  Do not wear nail polish including gel and S&S, artificial/acrylic nails, or any other type of covering on natural nails including finger and toenails. If you have artificial nails, gel coating, etc. that needs to be removed by a nail salon please have this removed prior to surgery or surgery may need to be canceled/ delayed if the surgeon/ anesthesia feels like they are unable to be safely monitored.   Do not shave  48 hours prior to surgery.    Do not bring valuables to the hospital. [facility] IS NOT             RESPONSIBLE   FOR VALUABLES.   Contacts, glasses, or bridgework may not be worn into surgery.   Bring small overnight bag day of surgery.   DO NOT BRING YOUR HOME MEDICATIONS TO THE HOSPITAL. PHARMACY  WILL DISPENSE MEDICATIONS LISTED ON YOUR MEDICATION LIST TO YOU DURING YOUR ADMISSION IN THE HOSPITAL!    Patients discharged on the day of surgery will not be allowed to drive home.  Someone NEEDS to stay with you for the first 24 hours after anesthesia.   Special Instructions: Bring a copy of your healthcare power of attorney and living will documents          the day of surgery if you haven't scanned them before.              Please read over the following fact sheets you were given: IF YOU HAVE QUESTIONS ABOUT YOUR PRE-OP INSTRUCTIONS PLEASE CALL [phone]    Incentive Spirometer  An incentive spirometer is a tool that can help keep your lungs clear and active. This tool measures how well you are filling your lungs with each breath. Taking long deep breaths may help reverse or decrease the chance of developing breathing (pulmonary) problems (especially infection) following: A long period of time when you are unable to move or be active. BEFORE THE PROCEDURE  If the spirometer includes an indicator to show your best effort, your nurse or respiratory therapist will set it to a desired goal. If possible, sit up straight or lean slightly forward. Try not to slouch. Hold the incentive spirometer in an upright position. INSTRUCTIONS FOR USE  Sit on the edge of your bed if possible, or sit up as far as you can in bed or on a chair. Hold the incentive spirometer in an upright position. Breathe out normally. Place the mouthpiece in your mouth and seal your lips tightly around it. Breathe in slowly and as deeply as possible, raising the piston or the ball toward the top of the column. Hold your breath for 3-5 seconds or for as long as possible. Allow the piston or ball to fall to the bottom of the column. Remove the mouthpiece from your mouth and breathe out normally. Rest for a few seconds and repeat Steps 1 through 7 at least 10 times every 1-2 hours when you are awake. Take your time and take a few normal breaths between deep breaths. The spirometer may include an indicator to show your best effort. Use the indicator as a goal to work toward during each repetition. After each set of 10 deep breaths, practice coughing to be sure your lungs are clear. If you have an incision (the cut made at the time of surgery), support your incision when coughing by  placing a pillow or rolled up towels firmly against it. Once you are able to get out of bed, walk around indoors and cough well. You may stop using the incentive spirometer when instructed by your caregiver.  RISKS AND COMPLICATIONS Take your time so you do not get dizzy or light-headed. If you are in pain, you may need to take or ask for pain medication before doing incentive spirometry. It is harder to take a deep breath if you are having pain. AFTER USE Rest and breathe slowly and easily. It can be helpful to keep track of a log of your progress. Your caregiver can provide you with a simple table to help with this. If you are using the spirometer at home, follow these instructions: SEEK MEDICAL CARE IF:  You are having difficultly using the spirometer. You have trouble using the spirometer as often as instructed. Your pain medication is not giving enough relief while using the spirometer. You develop fever of 100.5 F (  38.1 C) or higher. SEEK IMMEDIATE MEDICAL CARE IF:  You cough up bloody sputum that had not been present before. You develop fever of 102 F (38.9 C) or greater. You develop worsening pain at or near the incision site. MAKE SURE YOU:  Understand these instructions. Will watch your condition. Will get help right away if you are not doing well or get worse. Document Released: 11/16/2006 Document Revised: 09/28/2011 Document Reviewed: 01/17/2007 [facility] Patient Information 2014 [place], [place].   ________________________________________________________________________

## 2022-10-26 NOTE — Patient Instructions (Addendum)
Medication Instructions:  Your physician recommends that you continue on your current medications as directed. Please refer to the Current Medication list given to you today.  Stop Xarelto 3 days prior to surgery.  For your surgery on the 19th, do not take on the 16th-19th. Dr. Magnus Ivan will tell you when to resume after surgery.  Lab Work: Your physician recommends that you return for lab work tomorrow at [facility] and Thyroid Panel   Follow-Up: At [facility], you and your health needs are our priority.  As part of our continuing mission to provide you with exceptional heart care, we have created designated Provider Care Teams.  These Care Teams include your primary Cardiologist (physician) and Advanced Practice Providers (APPs -  Physician Assistants and Nurse Practitioners) who all work together to provide you with the care you need, when you need it.  We recommend signing up for the patient portal called "MyChart".  Sign up information is provided on this After Visit Summary.  MyChart is used to connect with patients for Virtual Visits (Telemedicine).  Patients are able to view lab/test results, encounter notes, upcoming appointments, etc.  Non-urgent messages can be sent to your provider as well.   To learn more about what you can do with MyChart, go to [email or web address].    Your next appointment:   3-4 months with [facility] and 6-8 months with Dr. Allyson Sabal

## 2022-10-27 ENCOUNTER — Encounter (HOSPITAL_COMMUNITY)
Admission: RE | Admit: 2022-10-27 | Discharge: 2022-10-27 | Disposition: A | Discharge status: Home / Self Care | Payer: Medicare Other | Source: Ambulatory Visit | Attending: Orthopaedic Surgery | Admitting: Orthopaedic Surgery

## 2022-10-27 ENCOUNTER — Encounter (HOSPITAL_COMMUNITY): Payer: Self-pay

## 2022-10-27 ENCOUNTER — Other Ambulatory Visit: Payer: Self-pay

## 2022-10-27 VITALS — BP 130/77 | HR 93 | Temp 98.6°F | Ht 65.0 in

## 2022-10-27 DIAGNOSIS — Z01812 Encounter for preprocedural laboratory examination: Secondary | ICD-10-CM | POA: Insufficient documentation

## 2022-10-27 DIAGNOSIS — Z01818 Encounter for other preprocedural examination: Secondary | ICD-10-CM

## 2022-10-27 DIAGNOSIS — Z7901 Long term (current) use of anticoagulants: Secondary | ICD-10-CM | POA: Insufficient documentation

## 2022-10-27 DIAGNOSIS — M1711 Unilateral primary osteoarthritis, right knee: Secondary | ICD-10-CM | POA: Insufficient documentation

## 2022-10-27 DIAGNOSIS — I10 Essential (primary) hypertension: Secondary | ICD-10-CM | POA: Insufficient documentation

## 2022-10-27 DIAGNOSIS — K219 Gastro-esophageal reflux disease without esophagitis: Secondary | ICD-10-CM | POA: Diagnosis not present

## 2022-10-27 HISTORY — DX: Cardiac arrhythmia, unspecified: I49.9

## 2022-10-27 LAB — CBC
HCT: 41.7 % (ref 36.0–46.0)
Hemoglobin: 13.2 g/dL (ref 12.0–15.0)
MCH: 29.5 pg (ref 26.0–34.0)
MCHC: 31.7 g/dL (ref 30.0–36.0)
MCV: 93.3 fL (ref 80.0–100.0)
Platelets: 279 10*3/uL (ref 150–400)
RBC: 4.47 MIL/uL (ref 3.87–5.11)
RDW: 13.1 % (ref 11.5–15.5)
WBC: 8.7 10*3/uL (ref 4.0–10.5)
nRBC: 0 % (ref 0.0–0.2)

## 2022-10-27 LAB — COMPREHENSIVE METABOLIC PANEL
ALT: 16 U/L (ref 0–44)
AST: 19 U/L (ref 15–41)
Albumin: 4 g/dL (ref 3.5–5.0)
Alkaline Phosphatase: 63 U/L (ref 38–126)
Anion gap: 7 (ref 5–15)
BUN: 23 mg/dL (ref 8–23)
CO2: 26 mmol/L (ref 22–32)
Calcium: 9.3 mg/dL (ref 8.9–10.3)
Chloride: 107 mmol/L (ref 98–111)
Creatinine, Ser: 1.29 mg/dL — ABNORMAL HIGH (ref 0.44–1.00)
GFR, Estimated: 42 mL/min — ABNORMAL LOW (ref >60)
Glucose, Bld: 69 mg/dL — ABNORMAL LOW (ref 70–99)
Potassium: 4.6 mmol/L (ref 3.5–5.1)
Sodium: 140 mmol/L (ref 135–145)
Total Bilirubin: 0.6 mg/dL (ref 0.3–1.2)
Total Protein: 7.1 g/dL (ref 6.5–8.1)

## 2022-10-27 LAB — SURGICAL PCR SCREEN
MRSA, PCR: NEGATIVE
Staphylococcus aureus: NEGATIVE

## 2022-10-27 NOTE — Progress Notes (Signed)
For Short Stay: COVID SWAB appointment date:  Bowel Prep reminder:   For Anesthesia: PCP - Dr. Merri Brunette. Cardiologist - Dr. Nanetta Batty. Clearance: Melissa Maldonado: NP: 10/26/22 Chest x-ray -  EKG - 09/11/22 Stress Test -  ECHO - 09/28/22 Cardiac Cath -  Pacemaker/ICD device last checked: Pacemaker orders received: Device Rep notified:  Spinal Cord Stimulator:  Sleep Study - N/A CPAP -   Fasting Blood Sugar - N/A Checks Blood Sugar __0___ times a day Date and result of last Hgb A1c-  Last dose of GLP1 agonist- Wegovy: 10/25/22 GLP1 instructions:   Last dose of SGLT-2 inhibitors- N/A SGLT-2 instructions:   Blood Thinner Instructions: Xarelto will be held on: 11/02/22 : Dr. Magnus Ivan instructions. Aspirin Instructions: Last Dose:  Activity level: Can go up a flight of stairs and activities of daily living without stopping and without chest pain and/or shortness of breath   Able to exercise without chest pain and/or shortness of breath  Anesthesia review: Hx: HTN,New Afib.  Patient denies shortness of breath, fever, cough and chest pain at PAT appointment   Patient verbalized understanding of instructions that were given to them at the PAT appointment. Patient was also instructed that they will need to review over the PAT instructions again at home before surgery.

## 2022-10-29 NOTE — Anesthesia Preprocedure Evaluation (Addendum)
Anesthesia Evaluation  Patient identified by MRN, date of birth, ID band Patient awake    Reviewed: Allergy & Precautions, NPO status , Patient's Chart, lab work & pertinent test results  History of Anesthesia Complications Negative for: history of anesthetic complications  Airway Mallampati: I  TM Distance: >3 FB Neck ROM: Full    Dental  (+) Missing, Dental Advisory Given   Pulmonary neg pulmonary ROS   breath sounds clear to auscultation       Cardiovascular hypertension, Pt. on medications (-) angina + dysrhythmias Atrial Fibrillation  Rhythm:Irregular Rate:Normal  09/2022 ECHO: EF 60-65%, normal LVF, normal RVF, trivial MR   Neuro/Psych Back pain    GI/Hepatic Neg liver ROS,GERD  Controlled,,  Endo/Other  wegovy  Renal/GU negative Renal ROS     Musculoskeletal   Abdominal   Peds  Hematology Xarelto: last dose Monday evening 4/15   Anesthesia Other Findings   Reproductive/Obstetrics                             Anesthesia Physical Anesthesia Plan  ASA: 3  Anesthesia Plan: Spinal   Post-op Pain Management: Regional block* and Tylenol PO (pre-op)*   Induction:   PONV Risk Score and Plan: 2  Airway Management Planned: Natural Airway and Simple Face Mask  Additional Equipment: None  Intra-op Plan:   Post-operative Plan:   Informed Consent: I have reviewed the patients History and Physical, chart, labs and discussed the procedure including the risks, benefits and alternatives for the proposed anesthesia with the patient or authorized representative who has indicated his/her understanding and acceptance.     Dental advisory given  Plan Discussed with: CRNA and Surgeon  Anesthesia Plan Comments: (See PAT note 10/27/2022 Plan SAB with adductor canal block for post op analgesia)       Anesthesia Quick Evaluation

## 2022-10-29 NOTE — Progress Notes (Signed)
Anesthesia Chart Review   Case: [id number] Date/Time: 11/06/22 1000   Procedure: RIGHT TOTAL KNEE ARTHROPLASTY (Right: Knee)   Anesthesia type: Choice   Pre-op diagnosis: endstage osteoarthritis right knee   Location: [facility] ROOM 09 / [facility]   Surgeons: Kathryne Hitch, MD       DISCUSSION:81 y.o. never smoker with h/o HTN, GERD, atrial fibrillation, right knee OA scheduled for above procedure 11/06/2022 with Dr. Doneen Poisson.   Case previously cancelled due to new onset a-fib in short stay. Followed up with cardiology.   Echo 09/28/22 normal LVEF, no significant valvular abnormalities.   Pt last seen by cardiology 10/26/2022. Per OV note, "Pending right total knee arthroscopy 11/06/22. Clearance previously provided. Can obtain >4 METS. May hold Xarelto 3 days prior to procedure. Per AHA/ACC guidelines, she is deemed acceptable risk for the planned procedure without additional cardiovascular testing."  Pt reports last dose of Xarelto 11/02/2022.   Anticipate pt can proceed with planned procedure barring acute status change.   VS: BP 130/77   Pulse 93   Temp 37 C (Oral)   Ht 5\' 5"  (1.651 m)   SpO2 100%   BMI 32.78 kg/m   PROVIDERS: Merri Brunette, MD is PCP   Cardiologist - Dr. Nanetta Batty.  LABS: Labs reviewed: Acceptable for surgery. (all labs ordered are listed, but only abnormal results are displayed)  Labs Reviewed  COMPREHENSIVE METABOLIC PANEL - Abnormal; Notable for the following components:      Result Value   Glucose, Bld 69 (*)    Creatinine, Ser 1.29 (*)    GFR, Estimated 42 (*)    All other components within normal limits  SURGICAL PCR SCREEN  CBC  TYPE AND SCREEN     IMAGES:   EKG:   CV: Echo 09/28/22 1. Left ventricular ejection fraction, by estimation, is 60 to 65%. The  left ventricle has normal function. The left ventricle has no regional  wall motion abnormalities. Left ventricular diastolic function could not  be evaluated.   2.  Right ventricular systolic function is normal. The right ventricular  size is normal. There is normal pulmonary artery systolic pressure.   3. Right atrial size was mildly dilated.   4. The mitral valve is normal in structure. Trivial mitral valve  regurgitation. No evidence of mitral stenosis.   5. The aortic valve is tricuspid. Aortic valve regurgitation is not  visualized. No aortic stenosis is present.   6. The inferior vena cava is normal in size with greater than 50%  respiratory variability, suggesting right atrial pressure of 3 mmHg.   Coronary calcium score 01/2020 IMPRESSION: Coronary calcium score of 0. Past Medical History:  Diagnosis Date   Arthritis    Chronic back pain    lumbar stenosis   Dysrhythmia    Early cataracts, bilateral    GERD (gastroesophageal reflux disease)    occasionally but no meds required   History of blood transfusion    History of colon polyps    Hyperlipidemia    takes Lipitor every other day   Hypertension    takes losartan daily   Neuropathy    Osteoarthritis of left knee 04/27/2014   Restless leg    Shortness of breath    with exertion   SOB (shortness of breath) 2013   CPET   Tear of medial meniscus of left knee 09/08/2013    Past Surgical History:  Procedure Laterality Date   APPENDECTOMY     BACK SURGERY  07/20/2008   lumb fusion   COLONOSCOPY  2018   EYE SURGERY     both cataracts   KNEE ARTHROSCOPY WITH MEDIAL MENISECTOMY Left 09/08/2013   Procedure: LEFT KNEE ARTHROSCOPY WITH PARTIAL MEDIAL MENISCECTOMY;  Surgeon: Eulas Post, MD;  Location: [facility];  Service: Orthopedics;  Laterality: Left;   LUMBAR LAMINECTOMY/DECOMPRESSION MICRODISCECTOMY  05/13/2012   Procedure: LUMBAR LAMINECTOMY/DECOMPRESSION MICRODISCECTOMY 1 LEVEL;  Surgeon: Barnett Abu, MD;  Location: [facility];  Service: Neurosurgery;  Laterality: Bilateral;  Bilateral Lumbar one -two Decompressive Laminectomy   PARTIAL KNEE  ARTHROPLASTY Left 04/27/2014   Procedure: LEFT UNICOMPARTMENTAL KNEE;  Surgeon: Eulas Post, MD;  Location: [facility];  Service: Orthopedics;  Laterality: Left;   TUBAL LIGATION  1974   UPPER GI ENDOSCOPY      MEDICATIONS:  acetaminophen (TYLENOL 8 HOUR) 650 MG CR tablet   amLODipine (NORVASC) 5 MG tablet   Cholecalciferol (VITAMIN D) 50 MCG (2000 UT) tablet   dorzolamide (TRUSOPT) 2 % ophthalmic solution   Rivaroxaban (XARELTO) 15 MG TABS tablet   rOPINIRole (REQUIP) 0.5 MG tablet   WEGOVY 2.4 MG/0.75ML SOAJ   No current facility-administered medications for this encounter.    Jodell Cipro Ward, PA-C [facility] Pre-Surgical Testing [phone]

## 2022-11-05 ENCOUNTER — Telehealth: Payer: Self-pay | Admitting: *Deleted

## 2022-11-05 NOTE — H&P (Signed)
TOTAL KNEE ADMISSION H&P  Patient is being admitted for right total knee arthroplasty.  Subjective:  Chief Complaint:right knee pain.  HPI: Melissa Maldonado, 82 y.o. female, has a history of pain and functional disability in the right knee due to arthritis and has failed non-surgical conservative treatments for greater than 12 weeks to includeNSAID's and/or analgesics, corticosteriod injections, viscosupplementation injections, flexibility and strengthening excercises, use of assistive devices, and activity modification.  Onset of symptoms was gradual, starting 3 years ago with gradually worsening course since that time. The patient noted no past surgery on the right knee(s).  Patient currently rates pain in the right knee(s) at 10 out of 10 with activity. Patient has night pain, worsening of pain with activity and weight bearing, pain that interferes with activities of daily living, pain with passive range of motion, crepitus, and joint swelling.  Patient has evidence of subchondral sclerosis, periarticular osteophytes, and joint space narrowing by imaging studies. There is no active infection.  Patient Active Problem List   Diagnosis Date Noted   Unilateral primary osteoarthritis, right knee 09/07/2022   Arthritis of right acromioclavicular joint 08/28/2021   Impingement syndrome of right shoulder 08/19/2021   Nontraumatic incomplete tear of right rotator cuff 08/19/2021   Paresthesia 12/05/2020   Low back pain with right-sided sciatica 12/05/2020   Trochanteric bursitis, right hip 04/24/2020   Essential hypertension 01/12/2020   Sinus bradycardia 01/12/2020   Dyspnea on exertion 01/12/2020   Lumbar stenosis with neurogenic claudication 02/01/2017   Osteoarthritis of left knee 04/27/2014   Past Medical History:  Diagnosis Date   Arthritis    Chronic back pain    lumbar stenosis   Dysrhythmia    Early cataracts, bilateral    GERD (gastroesophageal reflux disease)    occasionally but  no meds required   History of blood transfusion    History of colon polyps    Hyperlipidemia    takes Lipitor every other day   Hypertension    takes losartan daily   Neuropathy    Osteoarthritis of left knee 04/27/2014   Restless leg    Shortness of breath    with exertion   SOB (shortness of breath) 2013   CPET   Tear of medial meniscus of left knee 09/08/2013    Past Surgical History:  Procedure Laterality Date   APPENDECTOMY     BACK SURGERY  07/20/2008   lumb fusion   COLONOSCOPY  2018   EYE SURGERY     both cataracts   KNEE ARTHROSCOPY WITH MEDIAL MENISECTOMY Left 09/08/2013   Procedure: LEFT KNEE ARTHROSCOPY WITH PARTIAL MEDIAL MENISCECTOMY;  Surgeon: Eulas Post, MD;  Location: [facility];  Service: Orthopedics;  Laterality: Left;   LUMBAR LAMINECTOMY/DECOMPRESSION MICRODISCECTOMY  05/13/2012   Procedure: LUMBAR LAMINECTOMY/DECOMPRESSION MICRODISCECTOMY 1 LEVEL;  Surgeon: Barnett Abu, MD;  Location: [facility];  Service: Neurosurgery;  Laterality: Bilateral;  Bilateral Lumbar one -two Decompressive Laminectomy   PARTIAL KNEE ARTHROPLASTY Left 04/27/2014   Procedure: LEFT UNICOMPARTMENTAL KNEE;  Surgeon: Eulas Post, MD;  Location: [facility];  Service: Orthopedics;  Laterality: Left;   TUBAL LIGATION  1974   UPPER GI ENDOSCOPY      No current facility-administered medications for this encounter.   Current Outpatient Medications  Medication Sig Dispense Refill Last Dose   acetaminophen (TYLENOL 8 HOUR) 650 MG CR tablet Take 1,300 mg by mouth See admin instructions. Take 1300 mg daily, may take a second 1300 mg dose  as needed for pain      amLODipine (NORVASC) 5 MG tablet Take 5 mg by mouth daily.      dorzolamide (TRUSOPT) 2 % ophthalmic solution Place 1 drop into the left eye 2 (two) times daily.      rOPINIRole (REQUIP) 0.5 MG tablet Take 0.5 mg by mouth at bedtime.      WEGOVY 2.4 MG/0.75ML SOAJ Inject 2.4 mg into the skin  every Sunday.      Cholecalciferol (VITAMIN D) 50 MCG (2000 UT) tablet Take 2,000 Units by mouth daily.      Rivaroxaban (XARELTO) 15 MG TABS tablet Take 1 tablet (15 mg total) by mouth daily with supper. 90 tablet 3    Allergies  Allergen Reactions   Aspirin Other (See Comments)    "knocked out"   Vicks Vaporub [Liniments] Other (See Comments)    Makes her feel "out of it"   Codeine Nausea Only    Social History   Tobacco Use   Smoking status: Never   Smokeless tobacco: Never  Substance Use Topics   Alcohol use: Not Currently    Comment: socially wine    Family History  Problem Relation Age of Onset   Lupus Mother    Heart attack Father      Review of Systems  Objective:  Physical Exam Vitals reviewed.  Constitutional:      Appearance: Normal appearance. She is normal weight.  HENT:     Head: Normocephalic and atraumatic.  Eyes:     Extraocular Movements: Extraocular movements intact.     Pupils: Pupils are equal, round, and reactive to light.  Cardiovascular:     Rate and Rhythm: Normal rate. Rhythm irregular.     Pulses: Normal pulses.  Pulmonary:     Effort: Pulmonary effort is normal.     Breath sounds: Normal breath sounds.  Abdominal:     Palpations: Abdomen is soft.  Musculoskeletal:     Cervical back: Normal range of motion and neck supple.     Right knee: Effusion, bony tenderness and crepitus present. Decreased range of motion. Tenderness present over the medial joint line and lateral joint line. Abnormal alignment and abnormal meniscus.  Neurological:     Mental Status: She is alert and oriented to person, place, and time.  Psychiatric:        Behavior: Behavior normal.     Vital signs in last 24 hours:    Labs:   Estimated body mass index is 32.78 kg/m as calculated from the following:   Height as of 10/27/22:  (1.651 m).   Weight as of 10/26/22: 89.4 kg.   Imaging Review Plain radiographs demonstrate severe degenerative joint  disease of the right knee(s). The overall alignment ismild varus. The bone quality appears to be good for age and reported activity level.      Assessment/Plan:  End stage arthritis, right knee   The patient history, physical examination, clinical judgment of the provider and imaging studies are consistent with end stage degenerative joint disease of the right knee(s) and total knee arthroplasty is deemed medically necessary. The treatment options including medical management, injection therapy arthroscopy and arthroplasty were discussed at length. The risks and benefits of total knee arthroplasty were presented and reviewed. The risks due to aseptic loosening, infection, stiffness, patella tracking problems, thromboembolic complications and other imponderables were discussed. The patient acknowledged the explanation, agreed to proceed with the plan and consent was signed. Patient is being admitted for inpatient  treatment for surgery, pain control, PT, OT, prophylactic antibiotics, VTE prophylaxis, progressive ambulation and ADL's and discharge planning. The patient is planning to be discharged home with home health services

## 2022-11-05 NOTE — Telephone Encounter (Signed)
Ortho bundle pre-op call completed. 

## 2022-11-05 NOTE — Care Plan (Signed)
[facility] RNCM call to patient to discuss her upcoming Right total knee arthroplasty with Dr. Blackman on 09/08/22. She is an Ortho bundle patient through [facility] and is agreeable to case management. She lives with her spouse, who will be assisting after surgery. Anticipate HHPT will be needed after a short hospital stay. Referral made to [facility] after choice provided. She has a RW she states along with a 3in1/BSC. No DME needs. Will assist with setting up OPPT when appropriate. Reviewed post op care instructions. Will continue to follow for needs. 

## 2022-11-06 ENCOUNTER — Ambulatory Visit (HOSPITAL_COMMUNITY): Payer: Medicare Other | Admitting: Physician Assistant

## 2022-11-06 ENCOUNTER — Encounter (HOSPITAL_COMMUNITY): Payer: Self-pay | Admitting: Orthopaedic Surgery

## 2022-11-06 ENCOUNTER — Other Ambulatory Visit: Payer: Self-pay

## 2022-11-06 ENCOUNTER — Encounter (HOSPITAL_COMMUNITY)
Admission: RE | Disposition: A | Discharge status: Home Health | Payer: Self-pay | Source: Home / Self Care | Attending: Orthopaedic Surgery

## 2022-11-06 ENCOUNTER — Observation Stay (HOSPITAL_COMMUNITY)
Admission: RE | Admit: 2022-11-06 | Discharge: 2022-11-07 | Disposition: A | Discharge status: Home Health | Payer: Medicare Other | Attending: Orthopaedic Surgery | Admitting: Orthopaedic Surgery

## 2022-11-06 ENCOUNTER — Ambulatory Visit (HOSPITAL_BASED_OUTPATIENT_CLINIC_OR_DEPARTMENT_OTHER): Payer: Medicare Other | Admitting: Certified Registered"

## 2022-11-06 ENCOUNTER — Observation Stay (HOSPITAL_COMMUNITY): Payer: Medicare Other

## 2022-11-06 DIAGNOSIS — Z96652 Presence of left artificial knee joint: Secondary | ICD-10-CM | POA: Insufficient documentation

## 2022-11-06 DIAGNOSIS — Z471 Aftercare following joint replacement surgery: Secondary | ICD-10-CM | POA: Diagnosis not present

## 2022-11-06 DIAGNOSIS — I4891 Unspecified atrial fibrillation: Secondary | ICD-10-CM

## 2022-11-06 DIAGNOSIS — I480 Paroxysmal atrial fibrillation: Secondary | ICD-10-CM

## 2022-11-06 DIAGNOSIS — Z79899 Other long term (current) drug therapy: Secondary | ICD-10-CM | POA: Insufficient documentation

## 2022-11-06 DIAGNOSIS — I10 Essential (primary) hypertension: Secondary | ICD-10-CM

## 2022-11-06 DIAGNOSIS — M1711 Unilateral primary osteoarthritis, right knee: Secondary | ICD-10-CM

## 2022-11-06 DIAGNOSIS — G8918 Other acute postprocedural pain: Secondary | ICD-10-CM | POA: Diagnosis not present

## 2022-11-06 DIAGNOSIS — I4819 Other persistent atrial fibrillation: Secondary | ICD-10-CM

## 2022-11-06 DIAGNOSIS — Z96651 Presence of right artificial knee joint: Secondary | ICD-10-CM | POA: Diagnosis not present

## 2022-11-06 DIAGNOSIS — D6859 Other primary thrombophilia: Secondary | ICD-10-CM

## 2022-11-06 HISTORY — PX: TOTAL KNEE ARTHROPLASTY: SHX125

## 2022-11-06 LAB — TYPE AND SCREEN
ABO/RH(D): O POS
Antibody Screen: NEGATIVE

## 2022-11-06 SURGERY — ARTHROPLASTY, KNEE, TOTAL
Anesthesia: Spinal | Site: Knee | Laterality: Right

## 2022-11-06 MED ORDER — LACTATED RINGERS IV SOLN: INTRAVENOUS | Status: DC

## 2022-11-06 MED ORDER — PROPOFOL 1000 MG/100ML IV EMUL
INTRAVENOUS | Status: AC
  Filled 2022-11-06: qty 100

## 2022-11-06 MED ORDER — PHENYLEPHRINE HCL-NACL 20-0.9 MG/250ML-% IV SOLN
INTRAVENOUS | Status: DC | PRN
  Administered 2022-11-06: 25 ug/min via INTRAVENOUS

## 2022-11-06 MED ORDER — DIPHENHYDRAMINE HCL 12.5 MG/5ML PO ELIX: 12.5000 mg | ORAL_SOLUTION | ORAL | Status: DC | PRN

## 2022-11-06 MED ORDER — METOCLOPRAMIDE HCL 5 MG/ML IJ SOLN: 5.0000 mg | Freq: Three times a day (TID) | INTRAMUSCULAR | Status: DC | PRN

## 2022-11-06 MED ORDER — PROPOFOL 500 MG/50ML IV EMUL
INTRAVENOUS | Status: DC | PRN
  Administered 2022-11-06: 80 ug/kg/min via INTRAVENOUS

## 2022-11-06 MED ORDER — HYDROMORPHONE HCL 1 MG/ML IJ SOLN: 0.5000 mg | INTRAMUSCULAR | Status: DC | PRN

## 2022-11-06 MED ORDER — ONDANSETRON HCL 4 MG/2ML IJ SOLN: 4.0000 mg | Freq: Four times a day (QID) | INTRAMUSCULAR | Status: DC | PRN

## 2022-11-06 MED ORDER — PROMETHAZINE HCL 25 MG/ML IJ SOLN: 6.2500 mg | INTRAMUSCULAR | Status: DC | PRN

## 2022-11-06 MED ORDER — ONDANSETRON HCL 4 MG/2ML IJ SOLN
INTRAMUSCULAR | Status: DC | PRN
  Administered 2022-11-06: 4 mg via INTRAVENOUS

## 2022-11-06 MED ORDER — ROPIVACAINE HCL 7.5 MG/ML IJ SOLN
INTRAMUSCULAR | Status: DC | PRN
  Administered 2022-11-06: 20 mL via PERINEURAL

## 2022-11-06 MED ORDER — MIDAZOLAM HCL 2 MG/2ML IJ SOLN
INTRAMUSCULAR | Status: AC
  Administered 2022-11-06: 1 mg via INTRAVENOUS
  Filled 2022-11-06: qty 2

## 2022-11-06 MED ORDER — SODIUM CHLORIDE 0.9 % IR SOLN
Status: DC | PRN
  Administered 2022-11-06: 1000 mL

## 2022-11-06 MED ORDER — VITAMIN D 25 MCG (1000 UNIT) PO TABS
2000.0000 [IU] | ORAL_TABLET | Freq: Every day | ORAL | Status: DC
  Filled 2022-11-06 (×2): qty 2

## 2022-11-06 MED ORDER — OXYCODONE HCL 5 MG/5ML PO SOLN: 5.0000 mg | Freq: Once | ORAL | Status: DC | PRN

## 2022-11-06 MED ORDER — TIZANIDINE HCL 4 MG PO TABS: 2.0000 mg | ORAL_TABLET | Freq: Four times a day (QID) | ORAL | Status: DC | PRN

## 2022-11-06 MED ORDER — MIDAZOLAM HCL 2 MG/2ML IJ SOLN: 0.5000 mg | Freq: Once | INTRAMUSCULAR | Status: DC | PRN

## 2022-11-06 MED ORDER — PHENYLEPHRINE HCL (PRESSORS) 10 MG/ML IV SOLN
INTRAVENOUS | Status: AC
  Filled 2022-11-06: qty 1

## 2022-11-06 MED ORDER — HYDROMORPHONE HCL 1 MG/ML IJ SOLN: 0.2500 mg | INTRAMUSCULAR | Status: DC | PRN

## 2022-11-06 MED ORDER — CHLORHEXIDINE GLUCONATE 0.12 % MT SOLN
15.0000 mL | Freq: Once | OROMUCOSAL | Status: AC
  Administered 2022-11-06: 15 mL via OROMUCOSAL

## 2022-11-06 MED ORDER — PANTOPRAZOLE SODIUM 40 MG PO TBEC
40.0000 mg | DELAYED_RELEASE_TABLET | Freq: Every day | ORAL | Status: DC
  Administered 2022-11-07: 40 mg via ORAL
  Filled 2022-11-06: qty 1

## 2022-11-06 MED ORDER — DEXAMETHASONE SODIUM PHOSPHATE 10 MG/ML IJ SOLN
INTRAMUSCULAR | Status: AC
  Filled 2022-11-06: qty 1

## 2022-11-06 MED ORDER — DORZOLAMIDE HCL 2 % OP SOLN
1.0000 [drp] | Freq: Two times a day (BID) | OPHTHALMIC | Status: DC
  Administered 2022-11-06 – 2022-11-07 (×2): 1 [drp] via OPHTHALMIC
  Filled 2022-11-06: qty 10

## 2022-11-06 MED ORDER — PHENYLEPHRINE 80 MCG/ML (10ML) SYRINGE FOR IV PUSH (FOR BLOOD PRESSURE SUPPORT)
PREFILLED_SYRINGE | INTRAVENOUS | Status: DC | PRN
  Administered 2022-11-06: 80 ug via INTRAVENOUS
  Administered 2022-11-06: 160 ug via INTRAVENOUS
  Administered 2022-11-06: 80 ug via INTRAVENOUS

## 2022-11-06 MED ORDER — FENTANYL CITRATE PF 50 MCG/ML IJ SOSY
50.0000 ug | PREFILLED_SYRINGE | INTRAMUSCULAR | Status: DC
  Administered 2022-11-06: 50 ug via INTRAVENOUS
  Filled 2022-11-06: qty 2

## 2022-11-06 MED ORDER — AMLODIPINE BESYLATE 5 MG PO TABS: 5.0000 mg | ORAL_TABLET | Freq: Every day | ORAL | Status: DC

## 2022-11-06 MED ORDER — EPINEPHRINE PF 1 MG/ML IJ SOLN
INTRAMUSCULAR | Status: AC
  Filled 2022-11-06: qty 1

## 2022-11-06 MED ORDER — BUPIVACAINE IN DEXTROSE 0.75-8.25 % IT SOLN
INTRATHECAL | Status: DC | PRN
  Administered 2022-11-06: 1.6 mL via INTRATHECAL

## 2022-11-06 MED ORDER — AMLODIPINE BESYLATE 5 MG PO TABS
5.0000 mg | ORAL_TABLET | Freq: Every day | ORAL | Status: DC
  Administered 2022-11-07: 5 mg via ORAL
  Filled 2022-11-06: qty 1

## 2022-11-06 MED ORDER — ROPINIROLE HCL 0.5 MG PO TABS
0.5000 mg | ORAL_TABLET | Freq: Every day | ORAL | Status: DC
  Administered 2022-11-06: 0.5 mg via ORAL
  Filled 2022-11-06: qty 1

## 2022-11-06 MED ORDER — STERILE WATER FOR IRRIGATION IR SOLN
Status: DC | PRN
  Administered 2022-11-06 (×2): 1000 mL

## 2022-11-06 MED ORDER — MIDAZOLAM HCL 2 MG/2ML IJ SOLN: 1.0000 mg | INTRAMUSCULAR | Status: AC

## 2022-11-06 MED ORDER — POVIDONE-IODINE 10 % EX SWAB: 2.0000 | Freq: Once | CUTANEOUS | Status: DC

## 2022-11-06 MED ORDER — ACETAMINOPHEN 325 MG PO TABS: 325.0000 mg | ORAL_TABLET | Freq: Four times a day (QID) | ORAL | Status: DC | PRN

## 2022-11-06 MED ORDER — OXYCODONE HCL 5 MG PO TABS: 10.0000 mg | ORAL_TABLET | ORAL | Status: DC | PRN

## 2022-11-06 MED ORDER — BUPIVACAINE HCL 0.25 % IJ SOLN
INTRAMUSCULAR | Status: AC
  Filled 2022-11-06: qty 1

## 2022-11-06 MED ORDER — 0.9 % SODIUM CHLORIDE (POUR BTL) OPTIME
TOPICAL | Status: DC | PRN
  Administered 2022-11-06: 1000 mL

## 2022-11-06 MED ORDER — METOCLOPRAMIDE HCL 5 MG PO TABS: 5.0000 mg | ORAL_TABLET | Freq: Three times a day (TID) | ORAL | Status: DC | PRN

## 2022-11-06 MED ORDER — RIVAROXABAN 15 MG PO TABS: 15.0000 mg | ORAL_TABLET | Freq: Every day | ORAL | Status: DC

## 2022-11-06 MED ORDER — SODIUM CHLORIDE 0.9 % IV SOLN: INTRAVENOUS | Status: DC

## 2022-11-06 MED ORDER — OXYCODONE HCL 5 MG PO TABS
5.0000 mg | ORAL_TABLET | ORAL | Status: DC | PRN
  Administered 2022-11-06 – 2022-11-07 (×4): 10 mg via ORAL
  Filled 2022-11-06 (×5): qty 2

## 2022-11-06 MED ORDER — OXYCODONE HCL 5 MG PO TABS: 5.0000 mg | ORAL_TABLET | Freq: Once | ORAL | Status: DC | PRN

## 2022-11-06 MED ORDER — CEFAZOLIN SODIUM-DEXTROSE 2-4 GM/100ML-% IV SOLN
2.0000 g | INTRAVENOUS | Status: AC
  Administered 2022-11-06: 2 g via INTRAVENOUS
  Filled 2022-11-06: qty 100

## 2022-11-06 MED ORDER — ORAL CARE MOUTH RINSE: 15.0000 mL | Freq: Once | OROMUCOSAL | Status: AC

## 2022-11-06 MED ORDER — CEFAZOLIN SODIUM-DEXTROSE 1-4 GM/50ML-% IV SOLN
1.0000 g | Freq: Four times a day (QID) | INTRAVENOUS | Status: AC
  Administered 2022-11-06 (×2): 1 g via INTRAVENOUS
  Filled 2022-11-06 (×2): qty 50

## 2022-11-06 MED ORDER — PHENYLEPHRINE 80 MCG/ML (10ML) SYRINGE FOR IV PUSH (FOR BLOOD PRESSURE SUPPORT)
PREFILLED_SYRINGE | INTRAVENOUS | Status: AC
  Filled 2022-11-06: qty 10

## 2022-11-06 MED ORDER — TRANEXAMIC ACID-NACL 1000-0.7 MG/100ML-% IV SOLN
1000.0000 mg | INTRAVENOUS | Status: AC
  Administered 2022-11-06: 1000 mg via INTRAVENOUS
  Filled 2022-11-06: qty 100

## 2022-11-06 MED ORDER — DEXAMETHASONE SODIUM PHOSPHATE 10 MG/ML IJ SOLN
INTRAMUSCULAR | Status: DC | PRN
  Administered 2022-11-06: 8 mg via INTRAVENOUS

## 2022-11-06 MED ORDER — DORZOLAMIDE HCL 2 % OP SOLN
1.0000 [drp] | Freq: Two times a day (BID) | OPHTHALMIC | Status: DC
  Filled 2022-11-06: qty 10

## 2022-11-06 MED ORDER — ONDANSETRON HCL 4 MG/2ML IJ SOLN
INTRAMUSCULAR | Status: AC
  Filled 2022-11-06: qty 2

## 2022-11-06 MED ORDER — ONDANSETRON HCL 4 MG PO TABS: 4.0000 mg | ORAL_TABLET | Freq: Four times a day (QID) | ORAL | Status: DC | PRN

## 2022-11-06 MED ORDER — ACETAMINOPHEN 500 MG PO TABS
1000.0000 mg | ORAL_TABLET | Freq: Once | ORAL | Status: AC
  Administered 2022-11-06: 1000 mg via ORAL
  Filled 2022-11-06: qty 2

## 2022-11-06 MED ORDER — SENNA 8.6 MG PO TABS
1.0000 | ORAL_TABLET | Freq: Two times a day (BID) | ORAL | Status: DC
  Administered 2022-11-06 – 2022-11-07 (×3): 8.6 mg via ORAL
  Filled 2022-11-06 (×3): qty 1

## 2022-11-06 MED ORDER — PROPOFOL 10 MG/ML IV BOLUS
INTRAVENOUS | Status: AC
  Filled 2022-11-06: qty 20

## 2022-11-06 SURGICAL SUPPLY — 66 items
APL SKNCLS STERI-STRIP NONHPOA (GAUZE/BANDAGES/DRESSINGS)
BAG COUNTER SPONGE SURGICOUNT (BAG) IMPLANT
BAG SPEC THK2 15X12 ZIP CLS (MISCELLANEOUS) ×1
BAG SPNG CNTER NS LX DISP (BAG)
BAG ZIPLOCK 12X15 (MISCELLANEOUS) ×1 IMPLANT
BENZOIN TINCTURE PRP APPL 2/3 (GAUZE/BANDAGES/DRESSINGS) IMPLANT
BLADE SAG 18X100X1.27 (BLADE) ×1 IMPLANT
BLADE SURG SZ10 CARB STEEL (BLADE) ×2 IMPLANT
BNDG CMPR 5X62 HK CLSR LF (GAUZE/BANDAGES/DRESSINGS) ×2
BNDG CMPR MED 10X6 ELC LF (GAUZE/BANDAGES/DRESSINGS) ×1
BNDG ELASTIC 6INX 5YD STR LF (GAUZE/BANDAGES/DRESSINGS) ×2 IMPLANT
BNDG ELASTIC 6X10 VLCR STRL LF (GAUZE/BANDAGES/DRESSINGS) IMPLANT
BOWL SMART MIX CTS (DISPOSABLE) IMPLANT
CEMENT BONE R 1X40 (Cement) IMPLANT
CEMENT BONE SIMPLEX SPEEDSET (Cement) IMPLANT
COMP TIB CMT PS KNEE F 0D RT (Joint) ×1 IMPLANT
COMPONENT TIB CMT PS KN F 0DRT (Joint) IMPLANT
COOLER ICEMAN CLASSIC (MISCELLANEOUS) ×1 IMPLANT
COVER SURGICAL LIGHT HANDLE (MISCELLANEOUS) ×1 IMPLANT
CUFF TOURN SGL QUICK 34 (TOURNIQUET CUFF) ×1
CUFF TRNQT CYL 34X4.125X (TOURNIQUET CUFF) ×1 IMPLANT
DRAPE INCISE IOBAN 66X45 STRL (DRAPES) ×1 IMPLANT
DRAPE U-SHAPE 47X51 STRL (DRAPES) ×1 IMPLANT
DURAPREP 26ML APPLICATOR (WOUND CARE) ×1 IMPLANT
ELECT BLADE TIP CTD 4 INCH (ELECTRODE) ×1 IMPLANT
ELECT REM PT RETURN 15FT ADLT (MISCELLANEOUS) ×1 IMPLANT
FEMUR CMT CR STD SZ 8 RT KNEE (Joint) ×1 IMPLANT
FEMUR CMTD CR STD SZ 8 RT KNEE (Joint) IMPLANT
GAUZE PAD ABD 8X10 STRL (GAUZE/BANDAGES/DRESSINGS) ×2 IMPLANT
GAUZE SPONGE 4X4 12PLY STRL (GAUZE/BANDAGES/DRESSINGS) ×1 IMPLANT
GAUZE XEROFORM 1X8 LF (GAUZE/BANDAGES/DRESSINGS) IMPLANT
GLOVE BIO SURGEON STRL SZ7.5 (GLOVE) ×1 IMPLANT
GLOVE BIOGEL PI IND STRL 8 (GLOVE) ×2 IMPLANT
GLOVE ECLIPSE 8.0 STRL XLNG CF (GLOVE) ×1 IMPLANT
GOWN STRL REUS W/ TWL XL LVL3 (GOWN DISPOSABLE) ×2 IMPLANT
GOWN STRL REUS W/TWL XL LVL3 (GOWN DISPOSABLE) ×2
HANDPIECE INTERPULSE COAX TIP (DISPOSABLE) ×1
HDLS TROCR DRIL PIN KNEE 75 (PIN) ×1
HOLDER FOLEY CATH W/STRAP (MISCELLANEOUS) IMPLANT
IMMOBILIZER KNEE 20 (SOFTGOODS) ×1
IMMOBILIZER KNEE 20 THIGH 36 (SOFTGOODS) ×1 IMPLANT
IMMOBILIZER KNEE 22 UNIV (SOFTGOODS) IMPLANT
INSERT TIB ARTISURF SZ8-11X12 (Insert) IMPLANT
KIT TURNOVER KIT A (KITS) IMPLANT
NS IRRIG 1000ML POUR BTL (IV SOLUTION) ×1 IMPLANT
PACK TOTAL KNEE CUSTOM (KITS) ×1 IMPLANT
PAD COLD SHLDR WRAP-ON (PAD) ×1 IMPLANT
PADDING CAST ABS COTTON 6X4 NS (CAST SUPPLIES) IMPLANT
PADDING CAST COTTON 6X4 STRL (CAST SUPPLIES) ×2 IMPLANT
PIN DRILL HDLS TROCAR 75 4PK (PIN) IMPLANT
PROTECTOR NERVE ULNAR (MISCELLANEOUS) ×1 IMPLANT
SCREW FEMALE HEX FIX 25X2.5 (ORTHOPEDIC DISPOSABLE SUPPLIES) IMPLANT
SET HNDPC FAN SPRY TIP SCT (DISPOSABLE) ×1 IMPLANT
SET PAD KNEE POSITIONER (MISCELLANEOUS) ×1 IMPLANT
SPIKE FLUID TRANSFER (MISCELLANEOUS) IMPLANT
STAPLER VISISTAT 35W (STAPLE) IMPLANT
STEM POLY PAT PLY 32M KNEE (Knees) IMPLANT
STRIP CLOSURE SKIN 1/2X4 (GAUZE/BANDAGES/DRESSINGS) IMPLANT
SUT MNCRL AB 4-0 PS2 18 (SUTURE) IMPLANT
SUT VIC AB 0 CT1 27 (SUTURE) ×1
SUT VIC AB 0 CT1 27XBRD ANTBC (SUTURE) ×1 IMPLANT
SUT VIC AB 1 CT1 36 (SUTURE) ×2 IMPLANT
SUT VIC AB 2-0 CT1 27 (SUTURE) ×2
SUT VIC AB 2-0 CT1 TAPERPNT 27 (SUTURE) ×2 IMPLANT
TRAY FOLEY MTR SLVR 16FR STAT (SET/KITS/TRAYS/PACK) IMPLANT
WATER STERILE IRR 1000ML POUR (IV SOLUTION) ×2 IMPLANT

## 2022-11-06 NOTE — Anesthesia Procedure Notes (Signed)
Procedure Name: MAC Date/Time: 11/06/2022 10:14 AM  Performed by: Nelle Don, CRNAPre-anesthesia Checklist: Patient identified, Emergency Drugs available, Suction available and Patient being monitored Oxygen Delivery Method: Simple face mask

## 2022-11-06 NOTE — Anesthesia Procedure Notes (Signed)
Spinal  Patient location during procedure: OR Start time: 11/06/2022 10:14 AM End time: 11/06/2022 10:17 AM Reason for block: surgical anesthesia Staffing Performed: resident/CRNA  Resident/CRNA: Nelle Don, CRNA Performed by: Nelle Don, CRNA Authorized by: Jairo Ben, MD   Preanesthetic Checklist Completed: patient identified, IV checked, risks and benefits discussed, surgical consent, monitors and equipment checked, pre-op evaluation and timeout performed Spinal Block Patient position: sitting Prep: DuraPrep Patient monitoring: heart rate, cardiac monitor, continuous pulse ox and blood pressure Approach: midline Location: L3-4 Injection technique: single-shot Needle Needle type: Pencan  Needle gauge: 24 G Needle length: 10 cm Additional Notes Expiration date of kit checked. Clear CSF flow prior to injection. Dr. Jean Rosenthal present

## 2022-11-06 NOTE — Op Note (Signed)
Operative Note  Date of operation: 11/06/2022 Preoperative diagnosis: Right knee primary osteoarthritis Postoperative diagnosis: Same  Procedure: Right cemented total knee arthroplasty  Implants: Biomet/Zimmer persona knee system Implant Name Type Inv. Item Serial No. Manufacturer Lot No. LRB No. Used Action  CEMENT BONE R 1X40 - [id number] Cement CEMENT BONE R 1X40  ZIMMER RECON(ORTH,TRAU,BIO,SG) [id number] Right 2 Implanted  INSERT TIB ARTISURF [id number] - [id number] Insert INSERT TIB ARTISURF [id number]  ZIMMER RECON(ORTH,TRAU,BIO,SG) [id number] Right 1 Implanted  FEMUR CMT CR STD SZ 8 RT KNEE - [id number] Joint FEMUR CMT CR STD SZ 8 RT KNEE  ZIMMER RECON(ORTH,TRAU,BIO,SG) [id number] Right 1 Implanted  personalized knee system o keel right size f cemented tibia    [facility] KNEE [id number] Right 1 Implanted  STEM POLY PAT PLY 21M KNEE - [id number] Knees STEM POLY PAT PLY 21M KNEE  ZIMMER RECON(ORTH,TRAU,BIO,SG) [id number] Right 1 Implanted   Surgeon: Vanita Panda. Magnus Ivan, MD Assistant: Rexene Edison, PA-C  Anesthesia: #1 right lower extremity adductor canal block, #2 spinal Tourniquet time: Under 1 hour Antibiotics: 2 g IV Ancef EBL: Less than 50 cc Complications: None  Indications: The patient is a 82 year old female well-known to me.  She has well-documented debilitating end-stage arthritis involving her right knee.  She has tried and failed all forms of conservative treatment.  At this point her right knee pain is daily and it is detrimentally affecting her mobility, her quality of life and actives daily living.  Her x-rays and clinical exam also shows severe end-stage arthritis.  She wishes to proceed with a total knee arthroplasty and we agree with this as well.  We discussed in length in detail the risk of acute blood loss anemia, nerve or vessel injury, fracture, infection, DVT, implant failure, wound healing issues and scar tissue with decreased motion.  She understands her goals are  hopefully decrease pain, improve mobility, and improve quality of life.  Procedure description: After informed consent was obtained and the appropriate right knee was marked, anesthesia obtained a right lower extremity block in the holding room.  The patient was then brought to the operating room and set up on the operative table where spinal anesthesia was obtained.  She was then laid in supine position on the operating table and a Foley catheter was placed.  A nonsterile tourniquet is placed around her upper right thigh and her right thigh, knee, leg, ankle and foot were prepped and draped in DuraPrep and sterile drapes including a sterile stockinette.  A timeout was called and she was identified as the correct patient the correct right knee.  An Esmarch was then used to wrap out the leg and the tourniquet was inflated to 300 mm of pressure.  With the knee extended we then made incision over the patella and carried this proximally and distally.  Dissection was carried down to the knee joint and a medial parapatellar arthrotomy was made finding a moderate joint effusion.  With the knee in a flexed position we found complete denuding of the cartilage especially on the weightbearing surface of the medial femoral condyle.  There was osteophytes in all 3 compartments that were removed as well as remnants of the ACL and medial lateral meniscus.  We then used extramedullary cutting guide for making her proximal tibia cut setting this for a 7 degree cut correction for varus and valgus and taking 2 mm off the low side.  We made this cut without difficulty and actually backed it down to more millimeters.  We then used  a intramedullary guide for making her distal femoral cut using this guide for right knee at 5 degrees externally rotated and a 10 mm distal femoral cut.  We then cut without difficulty and brought the knee back down to full extension and had achieved full extension with a 10 mm block.  We then went back to  the femur and put a femoral sizing guide based off the epicondylar axis.  Based off of this we chose a size 8 femur.  We put a 4-in-1 cutting block for size 8 femur made her anterior posterior cuts followed by the chamfer cuts.  We then backed the tibia and chose a size F tibial tray for coverage over the tibial plateau for her right knee setting the rotation off the tibial tubercle and the femur.  We did our keel punch and drill hole off of this.  We then trialed our size F right tibia with our size 8 right CR standard femur.  We trialed a 10 mm and then went up to a 12 mm medial congruent polyethylene insert for right knee.  We are pleased with range of motion and stability without insert.  We then made our patella cut and drilled 3 holes for size 32 patella button.  Again with all transportation in the knee we put her through several cycles of motion and we are pleased with range of motion and stability.  We then removed all transportation from the knee and irrigate the knee with normal saline solution.  Bone cement was mixed and then with the knee in a flexed position we cemented our Biomet Zimmer persona tibial tray for right knee size F followed by our size 8 right CR standard femur.  We placed our 12 mm medial congruent right polythene insert and cemented our size 32 patella button.  Excess cement debris was removed from the knee and we held the knee extended and compressed while the cement hardened.  Once that was completed we let the tourniquet down and hemostasis was obtained with electrocautery.  The arthrotomy was closed with interrupted #1 Vicryl suture followed by 0 Vicryl close the deep tissue and 2-0 Vicryl close subcutaneous tissue.  The skin was closed with staples.  Well-padded sterile dressing was applied.  The patient was taken recovery in stable addition.  Rexene Edison, PA-C did assist during the entire case and beginning to end and his assistance was medically necessary for soft tissue retraction  and management, helping guide implant placement and a layered closure of the wound.

## 2022-11-06 NOTE — Anesthesia Postprocedure Evaluation (Signed)
Anesthesia Post Note  Patient: Melissa Maldonado  Procedure(s) Performed: RIGHT TOTAL KNEE ARTHROPLASTY (Right: Knee)     Patient location during evaluation: PACU Anesthesia Type: Spinal Level of consciousness: oriented, patient cooperative and awake and alert Pain management: pain level controlled Vital Signs Assessment: post-procedure vital signs reviewed and stable Respiratory status: nonlabored ventilation, spontaneous breathing and respiratory function stable Cardiovascular status: blood pressure returned to baseline and stable Postop Assessment: spinal receding and no apparent nausea or vomiting Anesthetic complications: no   No notable events documented.  Last Vitals:  Vitals:   11/06/22 1315 11/06/22 1334  BP: 111/83 118/83  Pulse: (!) 58 (!) 53  Resp: 11 16  Temp: (!) 36.4 C 36.7 C  SpO2: 100% 100%    Last Pain:  Vitals:   11/06/22 1334  TempSrc: Oral  PainSc: 2                  Sweta Halseth,E. Sharan Mcenaney

## 2022-11-06 NOTE — Interval H&P Note (Signed)
History and Physical Interval Note: The patient understands that she is here today for a right knee replacement to treat her severe right knee arthritis.  There has been no acute or interval change in her medical status.  The risks and benefits of surgery have been discussed in detail and informed consent is obtained.  The right operative knee has been marked.  11/06/2022 8:45 AM  Melissa Maldonado  has presented today for surgery, with the diagnosis of endstage osteoarthritis right knee.  The various methods of treatment have been discussed with the patient and family. After consideration of risks, benefits and other options for treatment, the patient has consented to  Procedure(s): RIGHT TOTAL KNEE ARTHROPLASTY (Right) as a surgical intervention.  The patient's history has been reviewed, patient examined, no change in status, stable for surgery.  I have reviewed the patient's chart and labs.  Questions were answered to the patient's satisfaction.     Kathryne Hitch

## 2022-11-06 NOTE — Transfer of Care (Addendum)
Immediate Anesthesia Transfer of Care Note  Patient: Melissa Maldonado  Procedure(s) Performed: RIGHT TOTAL KNEE ARTHROPLASTY (Right: Knee)  Patient Location: PACU  Anesthesia Type:Spinal  Level of Consciousness: drowsy  Airway & Oxygen Therapy: Patient Spontanous Breathing and Patient connected to face mask oxygen  Post-op Assessment: Report given to RN and Post -op Vital signs reviewed and stable  Post vital signs: Reviewed and stable  Last Vitals:  Vitals Value Taken Time  BP 82/58   Temp    Pulse 64 11/06/22 1221  Resp 10 11/06/22 1221  SpO2 99 % 11/06/22 1221  Vitals shown include unvalidated device data.  Last Pain:  Vitals:   11/06/22 0955  TempSrc:   PainSc: 0-No pain      Patients Stated Pain Goal: 4 (11/06/22 0825)  Complications: No notable events documented.

## 2022-11-06 NOTE — TOC Transition Note (Signed)
Transition of Care [facility]) - CM/SW Discharge Note  Patient Details  Name: NATAHSA MARIAN MRN: [id number] Date of Birth: Dec 31, 1940  Transition of Care [facility]) CM/SW Contact:  Ewing Schlein, LCSW Phone Number: 11/06/2022, 2:38 PM  Clinical Narrative: Patient is expected to discharge home after passing PT. CSW met with patient to confirm discharge plan. Patient will go home with HHPT, which was prearranged with [facility] in orthopedist's office. Patient has a rolling walker at home, so there are no DME needs at this time. TOC signing off.    Final next level of care: Home w Home Health Services Barriers to Discharge: No Barriers Identified  Patient Goals and CMS Choice CMS Medicare.gov Compare Post Acute Care list provided to:: Patient Choice offered to / list presented to : Patient  Discharge Plan and Services Additional resources added to the After Visit Summary for           DME Arranged: N/A DME Agency: NA HH Arranged: PT HH Agency: [facility] Representative spoke with at [facility] Agency: Prearranged in orthopedist's office  Social Determinants of Health (SDOH) Interventions SDOH Screenings   Tobacco Use: Low Risk  (11/06/2022)   Readmission Risk Interventions     No data to display

## 2022-11-06 NOTE — Evaluation (Signed)
Physical Therapy Evaluation Patient Details Name: Melissa Maldonado MRN: [id number] DOB: 05/08/1941 Today's Date: 11/06/2022  History of Present Illness  82 yo female presents to therapy s/p R TKA on 11/06/2022 due to failure of conservative measures. Pt PMH includes but is not limited to: R shoulder impingement syndrome, R RTC tear, LBP with sciatic and stenosis s/p fusion and lumbar laminectomy and decompression, HTN, R hip trochanteric bursitis, DOE, RLS, and  L UKA (2015).  Clinical Impression   Melissa Maldonado is a 82 y.o. female POD 0 s/p R TKA. Patient reports IND with mobility at baseline. Patient is now limited by functional impairments (see PT problem list below) and requires min guard for bed mobility and min guard and cues for transfers. Patient was able to ambulate 90 feet with RW and min guard level of assist. Patient instructed in exercise to facilitate ROM and circulation to manage edema. Patient will benefit from continued skilled PT interventions to address impairments and progress towards PLOF. Acute PT will follow to progress mobility and stair training in preparation for safe discharge home with family support and pt states HHPT to evaluate on 4/21.       Recommendations for follow up therapy are one component of a multi-disciplinary discharge planning process, led by the attending physician.  Recommendations may be updated based on patient status, additional functional criteria and insurance authorization.  Follow Up Recommendations       Assistance Recommended at Discharge Intermittent Supervision/Assistance  Patient can return home with the following  A little help with walking and/or transfers;A little help with bathing/dressing/bathroom;Assistance with cooking/housework;Assist for transportation;Help with stairs or ramp for entrance    Equipment Recommendations None recommended by PT (pt reports DME in home setting)  Recommendations for Other Services       Functional  Status Assessment Patient has had a recent decline in their functional status and demonstrates the ability to make significant improvements in function in a reasonable and predictable amount of time.     Precautions / Restrictions Precautions Precautions: Knee;Fall Restrictions Weight Bearing Restrictions: No      Mobility  Bed Mobility Overal bed mobility: Needs Assistance Bed Mobility: Supine to Sit     Supine to sit: Min guard, HOB elevated (bed rails)     General bed mobility comments: cues for sequencing and use of LLE to assist R for scooting to EOB    Transfers Overall transfer level: Needs assistance Equipment used: Rolling walker (2 wheels) Transfers: Sit to/from Stand Sit to Stand: Min guard           General transfer comment: cues for proper UE placement    Ambulation/Gait Ambulation/Gait assistance: Min guard Gait Distance (Feet): 90 Feet Assistive device: Rolling walker (2 wheels) Gait Pattern/deviations: Step-to pattern, Antalgic, Decreased stance time - right (step almost through pattern) Gait velocity: decreased        Stairs            Wheelchair Mobility    Modified Rankin (Stroke Patients Only)       Balance                                             Pertinent Vitals/Pain Pain Assessment Pain Assessment: 0-10 Pain Score: 9  (PT ~ 1 hr s/p pain medication. 8/10 at rest and increased slightly with functional mobility) Pain Location: R knee  pain Pain Descriptors / Indicators: Constant, Aching, Operative site guarding, Discomfort Pain Intervention(s): Limited activity within patient's tolerance, Monitored during session, Premedicated before session, Repositioned, Ice applied (iceman machine placed)    Home Living Family/patient expects to be discharged to:: Private residence Living Arrangements: Spouse/significant other Available Help at Discharge: Family Type of Home: House Home Access: Stairs to  enter Entrance Stairs-Rails: Doctor, general practice of Steps: 4 Alternate Level Stairs-Number of Steps: 7 (split level home) Home Layout: Multi-level Home Equipment: Agricultural consultant (2 wheels);BSC/3in1;Shower seat;Cane - single point      Prior Function Prior Level of Function : Independent/Modified Independent             Mobility Comments: amb with SPC in home, mod I for all ADLs, self care tasks, IADLs and driving       Hand Dominance        Extremity/Trunk Assessment        Lower Extremity Assessment Lower Extremity Assessment: RLE deficits/detail RLE Deficits / Details: ankle DF/PF 5/5; SLR < 10 degree lag    Cervical / Trunk Assessment Cervical / Trunk Assessment: Normal  Communication   Communication: No difficulties  Cognition Arousal/Alertness: Awake/alert Behavior During Therapy: WFL for tasks assessed/performed Overall Cognitive Status: Within Functional Limits for tasks assessed                                          General Comments      Exercises Total Joint Exercises Ankle Circles/Pumps: AROM, Both, 20 reps Marching in Standing: AROM, Right, 5 reps   Assessment/Plan    PT Assessment Patient needs continued PT services  PT Problem List Decreased strength;Decreased range of motion;Decreased activity tolerance;Decreased balance;Decreased mobility;Decreased coordination;Decreased cognition;Decreased knowledge of use of DME;Pain       PT Treatment Interventions DME instruction;Gait training;Stair training;Functional mobility training;Therapeutic activities;Therapeutic exercise;Balance training;Neuromuscular re-education;Patient/family education;Modalities    PT Goals (Current goals can be found in the Care Plan section)  Acute Rehab PT Goals Patient Stated Goal: to be able to walk straight PT Goal Formulation: With patient Time For Goal Achievement: 11/20/22 Potential to Achieve Goals: Good    Frequency  7X/week     Co-evaluation               AM-PAC PT "6 Clicks" Mobility  Outcome Measure Help needed turning from your back to your side while in a flat bed without using bedrails?: None Help needed moving from lying on your back to sitting on the side of a flat bed without using bedrails?: A Little Help needed moving to and from a bed to a chair (including a wheelchair)?: A Little Help needed standing up from a chair using your arms (e.g., wheelchair or bedside chair)?: A Little Help needed to walk in hospital room?: A Little Help needed climbing 3-5 steps with a railing? : A Lot 6 Click Score: 18    End of Session Equipment Utilized During Treatment: Gait belt     Nurse Communication: Mobility status;Patient requests pain meds PT Visit Diagnosis: Unsteadiness on feet (R26.81);Other abnormalities of gait and mobility (R26.89);Muscle weakness (generalized) (M62.81);Difficulty in walking, not elsewhere classified (R26.2);Pain Pain - Right/Left: Right Pain - part of body: Knee    Time: 1610-9604 PT Time Calculation (min) (ACUTE ONLY): 38 min   Charges:   PT Evaluation $PT Eval Low Complexity: 1 Low PT Treatments $Gait Training: 8-22 mins $Therapeutic Activity:  8-22 mins        Rica Mote, PT   Jacqualyn Posey 11/06/2022, 5:33 PM

## 2022-11-06 NOTE — Anesthesia Procedure Notes (Signed)
Anesthesia Regional Block: Adductor canal block   Pre-Anesthetic Checklist: , timeout performed,  Correct Patient, Correct Site, Correct Laterality,  Correct Procedure, Correct Position, site marked,  Risks and benefits discussed,  Surgical consent,  Pre-op evaluation,  At surgeon's request and post-op pain management  Laterality: Right and Lower  Prep: chloraprep       Needles:  Injection technique: Single-shot  Needle Type: Echogenic Needle     Needle Length: 9cm  Needle Gauge: 21     Additional Needles:   Procedures:,,,, ultrasound used (permanent image in chart),,    Narrative:  Start time: 11/06/2022 9:37 AM End time: 11/06/2022 9:43 AM Injection made incrementally with aspirations every 5 mL.  Performed by: Personally  Anesthesiologist: Jairo Ben, MD  Additional Notes: Pt identified in Holding room.  Monitors applied. Working IV access confirmed. Sterile prep R thigh.  #21ga ECHOgenic Arrow block needle into adductor canal with US guidance.  20cc 0.75% Ropivacaine injected incrementally after negative test dose.  Patient asymptomatic, VSS, no heme aspirated, tolerated well.   Sandford Craze, MD

## 2022-11-07 DIAGNOSIS — M1711 Unilateral primary osteoarthritis, right knee: Secondary | ICD-10-CM | POA: Diagnosis not present

## 2022-11-07 DIAGNOSIS — Z96652 Presence of left artificial knee joint: Secondary | ICD-10-CM | POA: Diagnosis not present

## 2022-11-07 DIAGNOSIS — Z79899 Other long term (current) drug therapy: Secondary | ICD-10-CM | POA: Diagnosis not present

## 2022-11-07 DIAGNOSIS — I10 Essential (primary) hypertension: Secondary | ICD-10-CM | POA: Diagnosis not present

## 2022-11-07 LAB — CBC
HCT: 35.2 % — ABNORMAL LOW (ref 36.0–46.0)
Hemoglobin: 11.3 g/dL — ABNORMAL LOW (ref 12.0–15.0)
MCH: 29.7 pg (ref 26.0–34.0)
MCHC: 32.1 g/dL (ref 30.0–36.0)
MCV: 92.6 fL (ref 80.0–100.0)
Platelets: 239 10*3/uL (ref 150–400)
RBC: 3.8 MIL/uL — ABNORMAL LOW (ref 3.87–5.11)
RDW: 13.1 % (ref 11.5–15.5)
WBC: 11 10*3/uL — ABNORMAL HIGH (ref 4.0–10.5)
nRBC: 0 % (ref 0.0–0.2)

## 2022-11-07 LAB — BASIC METABOLIC PANEL
Anion gap: 4 — ABNORMAL LOW (ref 5–15)
BUN: 19 mg/dL (ref 8–23)
CO2: 25 mmol/L (ref 22–32)
Calcium: 8.4 mg/dL — ABNORMAL LOW (ref 8.9–10.3)
Chloride: 108 mmol/L (ref 98–111)
Creatinine, Ser: 1.23 mg/dL — ABNORMAL HIGH (ref 0.44–1.00)
GFR, Estimated: 45 mL/min — ABNORMAL LOW (ref >60)
Glucose, Bld: 113 mg/dL — ABNORMAL HIGH (ref 70–99)
Potassium: 4.1 mmol/L (ref 3.5–5.1)
Sodium: 137 mmol/L (ref 135–145)

## 2022-11-07 MED ORDER — RIVAROXABAN 15 MG PO TABS
15.0000 mg | ORAL_TABLET | Freq: Every day | ORAL | 3 refills | Status: DC
Start: 2022-11-07 — End: 2023-11-30

## 2022-11-07 MED ORDER — VITAMIN D 25 MCG (1000 UNIT) PO TABS
2000.0000 [IU] | ORAL_TABLET | Freq: Every day | ORAL | Status: DC
  Administered 2022-11-07: 2000 [IU] via ORAL
  Filled 2022-11-07: qty 2

## 2022-11-07 MED ORDER — OXYCODONE HCL 5 MG PO TABS: 5.0000 mg | ORAL_TABLET | ORAL | 0 refills | Status: DC | PRN

## 2022-11-07 NOTE — Discharge Summary (Signed)
.  dc

## 2022-11-07 NOTE — Progress Notes (Signed)
Subjective: 1 Day Post-Op Procedure(s) (LRB): RIGHT TOTAL KNEE ARTHROPLASTY (Right) Patient reports pain as moderate.  Wants to go home later today.   Objective: Vital signs in last 24 hours: Temp:  [97.5 F (36.4 C)-98.1 F (36.7 C)] 97.9 F (36.6 C) (04/20 0603) Pulse Rate:  [53-98] 93 (04/20 0603) Resp:  [10-19] 17 (04/20 0603) BP: (80-122)/(48-91) 105/74 (04/20 0603) SpO2:  [97 %-100 %] 100 % (04/20 0603)  Intake/Output from previous day: 04/19 0701 - 04/20 0700 In: 3278.5 [P.O.:1150; I.V.:1873.7; IV Piggyback:254.8] Out: 2250 [Urine:2200; Blood:50] Intake/Output this shift: Total I/O In: 120 [P.O.:120] Out: 250 [Urine:250]  Recent Labs    11/07/22 0349  HGB 11.3*   Recent Labs    11/07/22 0349  WBC 11.0*  RBC 3.80*  HCT 35.2*  PLT 239   Recent Labs    11/07/22 0349  NA 137  K 4.1  CL 108  CO2 25  BUN 19  CREATININE 1.23*  GLUCOSE 113*  CALCIUM 8.4*   No results for input(s): "LABPT", "INR" in the last 72 hours.  Dorsiflexion/Plantar flexion intact Incision: scant drainage Compartment soft   Assessment/Plan: 1 Day Post-Op Procedure(s) (LRB): RIGHT TOTAL KNEE ARTHROPLASTY (Right) Up with therapy Will need to work on steps prior too discharge .  Discharge to home later today if stable and appropriate from PT standpoint.      Melissa Maldonado 11/07/2022, 9:06 AM

## 2022-11-07 NOTE — Plan of Care (Signed)
  Problem: Pain Management: Goal: Pain level will decrease with appropriate interventions Outcome: Progressing   Problem: Clinical Measurements: Goal: Ability to maintain clinical measurements within normal limits will improve Outcome: Progressing   Problem: Activity: Goal: Ability to avoid complications of mobility impairment will improve Outcome: Progressing

## 2022-11-07 NOTE — Discharge Instructions (Signed)

## 2022-11-07 NOTE — Progress Notes (Signed)
Physical Therapy Treatment Patient Details Name: Melissa Maldonado MRN: [id number] DOB: 12/29/1940 Today's Date: 11/07/2022   History of Present Illness 82 yo female presents to therapy s/p R TKA on 11/06/2022 due to failure of conservative measures. Pt PMH includes but is not limited to: R shoulder impingement syndrome, R RTC tear, LBP with sciatic and stenosis s/p fusion and lumbar laminectomy and decompression, HTN, R hip trochanteric bursitis, DOE, RLS, and  L UKA (2015).    PT Comments    Pt agreeable to working with therapy. Reviewed/practiced exercises, gait training, and stair training. Pain rated 8/10 by pt. She is eager to d/c home on today. All PT education completed.    Recommendations for follow up therapy are one component of a multi-disciplinary discharge planning process, led by the attending physician.  Recommendations may be updated based on patient status, additional functional criteria and insurance authorization.  Follow Up Recommendations       Assistance Recommended at Discharge Intermittent Supervision/Assistance  Patient can return home with the following A little help with walking and/or transfers;A little help with bathing/dressing/bathroom;Assistance with cooking/housework;Assist for transportation;Help with stairs or ramp for entrance   Equipment Recommendations  None recommended by PT    Recommendations for Other Services       Precautions / Restrictions Precautions Precautions: Fall;Knee Restrictions Weight Bearing Restrictions: No Other Position/Activity Restrictions: wbat     Mobility  Bed Mobility               General bed mobility comments: oob in recliner    Transfers Overall transfer level: Needs assistance Equipment used: Rolling walker (2 wheels) Transfers: Sit to/from Stand Sit to Stand: Supervision           General transfer comment: Supv for safety. Increased time.    Ambulation/Gait Ambulation/Gait assistance:  Supervision Gait Distance (Feet): 150 Feet Assistive device: Rolling walker (2 wheels) Gait Pattern/deviations: Decreased stride length       General Gait Details: Supv for safety. Tolerated distance well. Denied dizziness.   Stairs Stairs: Yes Stairs assistance: Supervision Stair Management: Step to pattern, Forwards Number of Stairs: 5 General stair comments: Practiced going up and overt portable stairs x 2-once with rails to simulate entry into home, once with cane + rail to simulate getting up to bedroom. Cues for safety, technique, sequence.   Wheelchair Mobility    Modified Rankin (Stroke Patients Only)       Balance Overall balance assessment: Needs assistance         Standing balance support: During functional activity, Reliant on assistive device for balance Standing balance-Leahy Scale: Fair                              Cognition Arousal/Alertness: Awake/alert Behavior During Therapy: WFL for tasks assessed/performed Overall Cognitive Status: Within Functional Limits for tasks assessed                                          Exercises Total Joint Exercises Ankle Circles/Pumps: AROM, Both, 10 reps Quad Sets: AROM, Both, 10 reps Hip ABduction/ADduction: AROM, Right, 10 reps Straight Leg Raises: AROM, Right, 10 reps Knee Flexion: AROM, Right, 10 reps, Standing Goniometric ROM: ~10-70 degrees    General Comments        Pertinent Vitals/Pain Pain Assessment Pain Assessment: 0-10 Pain Score: 8  Pain  Location: R knee/thigh pain Pain Descriptors / Indicators: Discomfort, Sore Pain Intervention(s): Monitored during session, Repositioned, Ice applied    Home Living                          Prior Function            PT Goals (current goals can now be found in the care plan section) Progress towards PT goals: Progressing toward goals    Frequency    7X/week      PT Plan Current plan remains  appropriate    Co-evaluation              AM-PAC PT "6 Clicks" Mobility   Outcome Measure  Help needed turning from your back to your side while in a flat bed without using bedrails?: None Help needed moving from lying on your back to sitting on the side of a flat bed without using bedrails?: None Help needed moving to and from a bed to a chair (including a wheelchair)?: A Little Help needed standing up from a chair using your arms (e.g., wheelchair or bedside chair)?: A Little Help needed to walk in hospital room?: A Little Help needed climbing 3-5 steps with a railing? : A Little 6 Click Score: 20    End of Session Equipment Utilized During Treatment: Gait belt Activity Tolerance: Patient tolerated treatment well Patient left: in chair;with call bell/phone within reach   PT Visit Diagnosis: Unsteadiness on feet (R26.81);Other abnormalities of gait and mobility (R26.89);Muscle weakness (generalized) (M62.81);Difficulty in walking, not elsewhere classified (R26.2);Pain Pain - Right/Left: Right Pain - part of body: Knee     Time: 1610-9604 PT Time Calculation (min) (ACUTE ONLY): 23 min  Charges:  $Gait Training: 8-22 mins $Therapeutic Exercise: 8-22 mins              Faye Ramsay, PT Acute Rehabilitation  Office: [phone]

## 2022-11-08 DIAGNOSIS — M7061 Trochanteric bursitis, right hip: Secondary | ICD-10-CM | POA: Diagnosis not present

## 2022-11-08 DIAGNOSIS — I10 Essential (primary) hypertension: Secondary | ICD-10-CM | POA: Diagnosis not present

## 2022-11-08 DIAGNOSIS — M19011 Primary osteoarthritis, right shoulder: Secondary | ICD-10-CM | POA: Diagnosis not present

## 2022-11-08 DIAGNOSIS — K219 Gastro-esophageal reflux disease without esophagitis: Secondary | ICD-10-CM | POA: Diagnosis not present

## 2022-11-08 DIAGNOSIS — H409 Unspecified glaucoma: Secondary | ICD-10-CM | POA: Diagnosis not present

## 2022-11-08 DIAGNOSIS — M75111 Incomplete rotator cuff tear or rupture of right shoulder, not specified as traumatic: Secondary | ICD-10-CM | POA: Diagnosis not present

## 2022-11-08 DIAGNOSIS — E785 Hyperlipidemia, unspecified: Secondary | ICD-10-CM | POA: Diagnosis not present

## 2022-11-08 DIAGNOSIS — Z7901 Long term (current) use of anticoagulants: Secondary | ICD-10-CM | POA: Diagnosis not present

## 2022-11-08 DIAGNOSIS — G629 Polyneuropathy, unspecified: Secondary | ICD-10-CM | POA: Diagnosis not present

## 2022-11-08 DIAGNOSIS — D649 Anemia, unspecified: Secondary | ICD-10-CM | POA: Diagnosis not present

## 2022-11-08 DIAGNOSIS — H269 Unspecified cataract: Secondary | ICD-10-CM | POA: Diagnosis not present

## 2022-11-08 DIAGNOSIS — Z96651 Presence of right artificial knee joint: Secondary | ICD-10-CM | POA: Diagnosis not present

## 2022-11-08 DIAGNOSIS — Z86010 Personal history of colonic polyps: Secondary | ICD-10-CM | POA: Diagnosis not present

## 2022-11-08 DIAGNOSIS — R32 Unspecified urinary incontinence: Secondary | ICD-10-CM | POA: Diagnosis not present

## 2022-11-08 DIAGNOSIS — Z96652 Presence of left artificial knee joint: Secondary | ICD-10-CM | POA: Diagnosis not present

## 2022-11-08 DIAGNOSIS — Z9181 History of falling: Secondary | ICD-10-CM | POA: Diagnosis not present

## 2022-11-08 DIAGNOSIS — G2581 Restless legs syndrome: Secondary | ICD-10-CM | POA: Diagnosis not present

## 2022-11-08 DIAGNOSIS — M5441 Lumbago with sciatica, right side: Secondary | ICD-10-CM | POA: Diagnosis not present

## 2022-11-08 DIAGNOSIS — M48061 Spinal stenosis, lumbar region without neurogenic claudication: Secondary | ICD-10-CM | POA: Diagnosis not present

## 2022-11-08 DIAGNOSIS — K5900 Constipation, unspecified: Secondary | ICD-10-CM | POA: Diagnosis not present

## 2022-11-08 DIAGNOSIS — M7541 Impingement syndrome of right shoulder: Secondary | ICD-10-CM | POA: Diagnosis not present

## 2022-11-08 DIAGNOSIS — Z471 Aftercare following joint replacement surgery: Secondary | ICD-10-CM | POA: Diagnosis not present

## 2022-11-08 DIAGNOSIS — H9193 Unspecified hearing loss, bilateral: Secondary | ICD-10-CM | POA: Diagnosis not present

## 2022-11-09 ENCOUNTER — Other Ambulatory Visit: Payer: Self-pay | Admitting: *Deleted

## 2022-11-09 ENCOUNTER — Telehealth: Payer: Self-pay | Admitting: *Deleted

## 2022-11-09 DIAGNOSIS — Z96651 Presence of right artificial knee joint: Secondary | ICD-10-CM

## 2022-11-09 DIAGNOSIS — M1711 Unilateral primary osteoarthritis, right knee: Secondary | ICD-10-CM

## 2022-11-09 NOTE — Telephone Encounter (Signed)
Ortho bundle D/C call completed. 

## 2022-11-10 ENCOUNTER — Encounter (HOSPITAL_COMMUNITY): Payer: Self-pay | Admitting: Orthopaedic Surgery

## 2022-11-10 NOTE — Discharge Summary (Signed)
Patient ID: Melissa Maldonado MRN: [id number] DOB/AGE: [birth year] 82 y.o.  Admit date: 11/06/2022 Discharge date: 11/07/22  Admission Diagnoses:  Principal Problem:   Unilateral primary osteoarthritis, right knee Active Problems:   Status post total right knee replacement   Discharge Diagnoses:  Same  Past Medical History:  Diagnosis Date   Arthritis    Chronic back pain    lumbar stenosis   Dysrhythmia    Early cataracts, bilateral    GERD (gastroesophageal reflux disease)    occasionally but no meds required   History of blood transfusion    History of colon polyps    Hyperlipidemia    takes Lipitor every other day   Hypertension    takes losartan daily   Neuropathy    Osteoarthritis of left knee 04/27/2014   Restless leg    Shortness of breath    with exertion   SOB (shortness of breath) 2013   CPET   Tear of medial meniscus of left knee 09/08/2013    Surgeries: Procedure(s): RIGHT TOTAL KNEE ARTHROPLASTY on 11/06/2022   Consultants:   Discharged Condition: Improved  Hospital Course: Melissa Maldonado is an 82 y.o. female who was admitted 11/06/2022 for operative treatment ofUnilateral primary osteoarthritis, right knee. Patient has severe unremitting pain that affects sleep, daily activities, and work/hobbies. After pre-op clearance the patient was taken to the operating room on 11/06/2022 and underwent  Procedure(s): RIGHT TOTAL KNEE ARTHROPLASTY.    Patient was given perioperative antibiotics:  Anti-infectives (From admission, onward)    Start     Dose/Rate Route Frequency Ordered Stop   11/06/22 1630  ceFAZolin (ANCEF) IVPB 1 g/50 mL premix        1 g 100 mL/hr over 30 Minutes Intravenous Every 6 hours 11/06/22 1330 11/06/22 2227   11/06/22 0800  ceFAZolin (ANCEF) IVPB 2g/100 mL premix        2 g 200 mL/hr over 30 Minutes Intravenous On call to O.R. 11/06/22 0758 11/06/22 1048        Patient was given sequential compression devices, early ambulation, and  chemoprophylaxis to prevent DVT.  Patient benefited maximally from hospital stay and there were no complications.    Recent vital signs: No data found.   Recent laboratory studies: No results for input(s): "WBC", "HGB", "HCT", "PLT", "NA", "K", "CL", "CO2", "BUN", "CREATININE", "GLUCOSE", "INR", "CALCIUM" in the last 72 hours.  Invalid input(s): "PT", "2"   Discharge Medications:   Allergies as of 11/07/2022       Reactions   Aspirin Other (See Comments)   "knocked out"   Vicks Vaporub [liniments] Other (See Comments)   Makes her feel "out of it"   Codeine Nausea Only        Medication List     TAKE these medications    amLODipine 5 MG tablet Commonly known as: NORVASC Take 5 mg by mouth daily.   dorzolamide 2 % ophthalmic solution Commonly known as: TRUSOPT Place 1 drop into the left eye 2 (two) times daily.   oxyCODONE 5 MG immediate release tablet Commonly known as: Oxy IR/ROXICODONE Take 1-2 tablets (5-10 mg total) by mouth every 4 (four) hours as needed for moderate pain (pain score 4-6).   Rivaroxaban 15 MG Tabs tablet Commonly known as: XARELTO Take 1 tablet (15 mg total) by mouth daily with supper. Take 1 st dose 11/07/22 at 1700 hrs. What changed: additional instructions   rOPINIRole 0.5 MG tablet Commonly known as: REQUIP Take 0.5 mg by mouth  at bedtime.   Tylenol 8 Hour 650 MG CR tablet Generic drug: acetaminophen Take 1,300 mg by mouth See admin instructions. Take 1300 mg daily, may take a second 1300 mg dose as needed for pain   Vitamin D 50 MCG (2000 UT) tablet Take 2,000 Units by mouth daily.   Wegovy 2.4 MG/0.75ML Soaj Generic drug: Semaglutide-Weight Management Inject 2.4 mg into the skin every Sunday.        Diagnostic Studies: DG Knee Right Port  Result Date: 11/06/2022 CLINICAL DATA:  Status post right knee arthroplasty. EXAM: PORTABLE RIGHT KNEE - 1-2 VIEW COMPARISON:  Right knee radiographs 12/02/2021 FINDINGS: Interval total  right knee arthroplasty. No perihardware lucency is seen to indicate hardware failure or loosening. Normal alignment. Expected postoperative changes including small joint effusion and intra-articular and subcutaneous air. Anterior surgical skin staples. No acute fracture or dislocation. IMPRESSION: Interval total right knee arthroplasty without evidence of hardware failure. Electronically Signed   By: Neita Garnet M.D.   On: 11/06/2022 13:27    Disposition: Discharge disposition: 06-Home-Health Care Svc          Follow-up Information     [facility] Follow up.   Specialty: Home Health Services Why: [facility] will provide PT in the home after discharge. Contact information: [address] [place] [address] [phone]         Kathryne Hitch, MD. Schedule an appointment as soon as possible for a visit in 2 week(s).   Specialty: Orthopedic Surgery Contact information: [address] [place] [address] [phone]                  Signed: Richardean Canal 11/10/2022, 2:12 PM

## 2022-11-11 ENCOUNTER — Telehealth: Payer: Self-pay | Admitting: *Deleted

## 2022-11-11 ENCOUNTER — Other Ambulatory Visit: Payer: Self-pay | Admitting: Physician Assistant

## 2022-11-11 DIAGNOSIS — M19011 Primary osteoarthritis, right shoulder: Secondary | ICD-10-CM | POA: Diagnosis not present

## 2022-11-11 DIAGNOSIS — I10 Essential (primary) hypertension: Secondary | ICD-10-CM | POA: Diagnosis not present

## 2022-11-11 DIAGNOSIS — M7541 Impingement syndrome of right shoulder: Secondary | ICD-10-CM | POA: Diagnosis not present

## 2022-11-11 DIAGNOSIS — Z471 Aftercare following joint replacement surgery: Secondary | ICD-10-CM | POA: Diagnosis not present

## 2022-11-11 DIAGNOSIS — M48061 Spinal stenosis, lumbar region without neurogenic claudication: Secondary | ICD-10-CM | POA: Diagnosis not present

## 2022-11-11 DIAGNOSIS — M75111 Incomplete rotator cuff tear or rupture of right shoulder, not specified as traumatic: Secondary | ICD-10-CM | POA: Diagnosis not present

## 2022-11-11 MED ORDER — OXYCODONE HCL 5 MG PO TABS: 5.0000 mg | ORAL_TABLET | ORAL | 0 refills | Status: DC | PRN

## 2022-11-11 NOTE — Telephone Encounter (Signed)
Patient called requesting refill of pain medication. Pharmacy on chart. Thank you.

## 2022-11-12 DIAGNOSIS — I10 Essential (primary) hypertension: Secondary | ICD-10-CM | POA: Diagnosis not present

## 2022-11-12 DIAGNOSIS — M7541 Impingement syndrome of right shoulder: Secondary | ICD-10-CM | POA: Diagnosis not present

## 2022-11-12 DIAGNOSIS — Z471 Aftercare following joint replacement surgery: Secondary | ICD-10-CM | POA: Diagnosis not present

## 2022-11-12 DIAGNOSIS — M19011 Primary osteoarthritis, right shoulder: Secondary | ICD-10-CM | POA: Diagnosis not present

## 2022-11-12 DIAGNOSIS — M75111 Incomplete rotator cuff tear or rupture of right shoulder, not specified as traumatic: Secondary | ICD-10-CM | POA: Diagnosis not present

## 2022-11-12 DIAGNOSIS — M48061 Spinal stenosis, lumbar region without neurogenic claudication: Secondary | ICD-10-CM | POA: Diagnosis not present

## 2022-11-14 DIAGNOSIS — M75111 Incomplete rotator cuff tear or rupture of right shoulder, not specified as traumatic: Secondary | ICD-10-CM | POA: Diagnosis not present

## 2022-11-14 DIAGNOSIS — M19011 Primary osteoarthritis, right shoulder: Secondary | ICD-10-CM | POA: Diagnosis not present

## 2022-11-14 DIAGNOSIS — M48061 Spinal stenosis, lumbar region without neurogenic claudication: Secondary | ICD-10-CM | POA: Diagnosis not present

## 2022-11-14 DIAGNOSIS — I10 Essential (primary) hypertension: Secondary | ICD-10-CM | POA: Diagnosis not present

## 2022-11-14 DIAGNOSIS — M7541 Impingement syndrome of right shoulder: Secondary | ICD-10-CM | POA: Diagnosis not present

## 2022-11-14 DIAGNOSIS — Z471 Aftercare following joint replacement surgery: Secondary | ICD-10-CM | POA: Diagnosis not present

## 2022-11-17 DIAGNOSIS — M48061 Spinal stenosis, lumbar region without neurogenic claudication: Secondary | ICD-10-CM | POA: Diagnosis not present

## 2022-11-17 DIAGNOSIS — M7541 Impingement syndrome of right shoulder: Secondary | ICD-10-CM | POA: Diagnosis not present

## 2022-11-17 DIAGNOSIS — M19011 Primary osteoarthritis, right shoulder: Secondary | ICD-10-CM | POA: Diagnosis not present

## 2022-11-17 DIAGNOSIS — I10 Essential (primary) hypertension: Secondary | ICD-10-CM | POA: Diagnosis not present

## 2022-11-17 DIAGNOSIS — Z471 Aftercare following joint replacement surgery: Secondary | ICD-10-CM | POA: Diagnosis not present

## 2022-11-17 DIAGNOSIS — M75111 Incomplete rotator cuff tear or rupture of right shoulder, not specified as traumatic: Secondary | ICD-10-CM | POA: Diagnosis not present

## 2022-11-18 DIAGNOSIS — M7541 Impingement syndrome of right shoulder: Secondary | ICD-10-CM | POA: Diagnosis not present

## 2022-11-18 DIAGNOSIS — M19011 Primary osteoarthritis, right shoulder: Secondary | ICD-10-CM | POA: Diagnosis not present

## 2022-11-18 DIAGNOSIS — I10 Essential (primary) hypertension: Secondary | ICD-10-CM | POA: Diagnosis not present

## 2022-11-18 DIAGNOSIS — Z471 Aftercare following joint replacement surgery: Secondary | ICD-10-CM | POA: Diagnosis not present

## 2022-11-18 DIAGNOSIS — M48061 Spinal stenosis, lumbar region without neurogenic claudication: Secondary | ICD-10-CM | POA: Diagnosis not present

## 2022-11-18 DIAGNOSIS — M75111 Incomplete rotator cuff tear or rupture of right shoulder, not specified as traumatic: Secondary | ICD-10-CM | POA: Diagnosis not present

## 2022-11-19 ENCOUNTER — Other Ambulatory Visit: Payer: Self-pay

## 2022-11-19 ENCOUNTER — Encounter: Payer: Self-pay | Admitting: Orthopaedic Surgery

## 2022-11-19 ENCOUNTER — Ambulatory Visit (INDEPENDENT_AMBULATORY_CARE_PROVIDER_SITE_OTHER): Payer: Medicare Other | Admitting: Orthopaedic Surgery

## 2022-11-19 DIAGNOSIS — Z96651 Presence of right artificial knee joint: Secondary | ICD-10-CM

## 2022-11-19 MED ORDER — OXYCODONE HCL 5 MG PO TABS: 5.0000 mg | ORAL_TABLET | ORAL | 0 refills | Status: DC | PRN

## 2022-11-19 NOTE — Progress Notes (Signed)
The patient is a 82 year old female who is here today for first postoperative visit status post a right total knee arthroplasty.  Her notes from therapy stated they have been able to flex her knee to just past 80 degrees.  She is on Xarelto chronically.  She is back on that.  She does have foot and ankle swelling and has a hard time wearing her TED hose because it is tight.  I did speak with her husband about trying an extra-large pair from the drugstore.  Her right knee incision looks good.  The staples have been removed and Steri-Strips applied.  Her calf is soft.  There is foot and ankle swelling and I showed her how to pump her feet.  She is neurovascularly intact.  Her extension is almost full and I can flex her to maybe 80 degrees.  She understands that it is essential we get her into outpatient physical therapy and that she push herself multiple times on a daily basis to get her knee flexing and extending.  I will send in a refill of oxycodone.  Will see her back in 4 weeks to see how she is doing overall but no x-rays are needed.

## 2022-11-20 ENCOUNTER — Encounter: Payer: Self-pay | Admitting: Physical Therapy

## 2022-11-20 ENCOUNTER — Ambulatory Visit (INDEPENDENT_AMBULATORY_CARE_PROVIDER_SITE_OTHER): Payer: Medicare Other | Admitting: Physical Therapy

## 2022-11-20 ENCOUNTER — Other Ambulatory Visit: Payer: Self-pay

## 2022-11-20 DIAGNOSIS — M25661 Stiffness of right knee, not elsewhere classified: Secondary | ICD-10-CM | POA: Diagnosis not present

## 2022-11-20 DIAGNOSIS — M25561 Pain in right knee: Secondary | ICD-10-CM | POA: Diagnosis not present

## 2022-11-20 DIAGNOSIS — R262 Difficulty in walking, not elsewhere classified: Secondary | ICD-10-CM | POA: Diagnosis not present

## 2022-11-20 DIAGNOSIS — R600 Localized edema: Secondary | ICD-10-CM

## 2022-11-20 DIAGNOSIS — M6281 Muscle weakness (generalized): Secondary | ICD-10-CM

## 2022-11-20 NOTE — Therapy (Signed)
OUTPATIENT PHYSICAL THERAPY LOWER EXTREMITY EVALUATION   Patient Name: Melissa Maldonado MRN: [id number] DOB:1940-11-20, 82 y.o., female Today's Date: 11/20/2022  END OF SESSION:  PT End of Session - 11/20/22 1352     Visit Number 1    Number of Visits 21    Date for PT Re-Evaluation 01/15/23    Authorization Type MCR and BCBS State    Authorization Time Period 11/20/22 to 01/15/23    Progress Note Due on Visit 10    PT Start Time 1302    PT Stop Time 1345    PT Time Calculation (min) 43 min    Activity Tolerance Patient tolerated treatment well    Behavior During Therapy [facility] for tasks assessed/performed             Past Medical History:  Diagnosis Date   Arthritis    Chronic back pain    lumbar stenosis   Dysrhythmia    Early cataracts, bilateral    GERD (gastroesophageal reflux disease)    occasionally but no meds required   History of blood transfusion    History of colon polyps    Hyperlipidemia    takes Lipitor every other day   Hypertension    takes losartan daily   Neuropathy    Osteoarthritis of left knee 04/27/2014   Restless leg    Shortness of breath    with exertion   SOB (shortness of breath) 2013   CPET   Tear of medial meniscus of left knee 09/08/2013   Past Surgical History:  Procedure Laterality Date   APPENDECTOMY     BACK SURGERY  07/20/2008   lumb fusion   COLONOSCOPY  2018   EYE SURGERY     both cataracts   KNEE ARTHROSCOPY WITH MEDIAL MENISECTOMY Left 09/08/2013   Procedure: LEFT KNEE ARTHROSCOPY WITH PARTIAL MEDIAL MENISCECTOMY;  Surgeon: Eulas Post, MD;  Location: [facility];  Service: Orthopedics;  Laterality: Left;   LUMBAR LAMINECTOMY/DECOMPRESSION MICRODISCECTOMY  05/13/2012   Procedure: LUMBAR LAMINECTOMY/DECOMPRESSION MICRODISCECTOMY 1 LEVEL;  Surgeon: Barnett Abu, MD;  Location: [facility];  Service: Neurosurgery;  Laterality: Bilateral;  Bilateral Lumbar one -two Decompressive Laminectomy   PARTIAL KNEE  ARTHROPLASTY Left 04/27/2014   Procedure: LEFT UNICOMPARTMENTAL KNEE;  Surgeon: Eulas Post, MD;  Location: [facility];  Service: Orthopedics;  Laterality: Left;   TOTAL KNEE ARTHROPLASTY Right 11/06/2022   Procedure: RIGHT TOTAL KNEE ARTHROPLASTY;  Surgeon: Kathryne Hitch, MD;  Location: [facility];  Service: Orthopedics;  Laterality: Right;   TUBAL LIGATION  1974   UPPER GI ENDOSCOPY     Patient Active Problem List   Diagnosis Date Noted   Status post total right knee replacement 11/06/2022   Unilateral primary osteoarthritis, right knee 09/07/2022   Arthritis of right acromioclavicular joint 08/28/2021   Impingement syndrome of right shoulder 08/19/2021   Nontraumatic incomplete tear of right rotator cuff 08/19/2021   Paresthesia 12/05/2020   Low back pain with right-sided sciatica 12/05/2020   Trochanteric bursitis, right hip 04/24/2020   Essential hypertension 01/12/2020   Sinus bradycardia 01/12/2020   Dyspnea on exertion 01/12/2020   Lumbar stenosis with neurogenic claudication 02/01/2017   Osteoarthritis of left knee 04/27/2014    PCP: Merri Brunette MD   REFERRING PROVIDER: Kathryne Hitch, MD  REFERRING DIAG: [phone] (ICD-10-CM) - Status post total right knee replacement M17.11 (ICD-10-CM) - Unilateral primary osteoarthritis, right knee  THERAPY DIAG:  Stiffness of right knee, not elsewhere  classified  Acute pain of right knee  Localized edema  Muscle weakness (generalized)  Difficulty in walking, not elsewhere classified  Rationale for Evaluation and Treatment: Rehabilitation  ONSET DATE: 11/09/2022  SUBJECTIVE:   SUBJECTIVE STATEMENT:  Had my surgery 2 weeks ago on 4/19. Its doing OK, still hard for me to bend the knee. HHPT is done, they finished earlier this week. Took my pain medicine about an hour ago.   PERTINENT HISTORY: Hospital Course: Melissa Maldonado is an 82 y.o. female who was admitted 11/06/2022 for operative  treatment ofUnilateral primary osteoarthritis, right knee. Patient has severe unremitting pain that affects sleep, daily activities, and work/hobbies. After pre-op clearance the patient was taken to the operating room on 11/06/2022 and underwent  Procedure(s): RIGHT TOTAL KNEE ARTHROPLASTY.     PAIN:  Are you having pain? Yes: NPRS scale: 5/10 Pain location: R knee  Pain description: stabbing pain  Aggravating factors: bending Relieving factors: ice   PRECAUTIONS: None  WEIGHT BEARING RESTRICTIONS: No  FALLS:  Has patient fallen in last 6 months? No  LIVING ENVIRONMENT: Lives with: lives with their spouse Lives in: House/apartment Stairs: 4 STE B rails, split level home with 6 steps up/6 steps down U rail  Has following equipment at home: Single point cane, Walker - 2 wheeled, and toilet raiser   OCCUPATION: retired   PLOF: Independent, Independent with basic ADLs, Independent with gait, and Independent with transfers  PATIENT GOALS: be able to walk without pain  NEXT MD VISIT: Dr. Magnus Ivan May 30th   OBJECTIVE:   DIAGNOSTIC FINDINGS:   PATIENT SURVEYS:  FOTO 55, predicted 82  COGNITION: Overall cognitive status:  possible STM impairment, family not present to confirm       SENSATION: Not tested  EDEMA:   Some excessive pitting edema in surgical calf, no temperature or color changes, Homan's negative; on daily Xarelto per her report and per chart, she tells me she has been compliant with this medication      LOWER EXTREMITY ROM:  Active ROM Right eval Left eval  Hip flexion    Hip extension    Hip abduction    Hip adduction    Hip internal rotation    Hip external rotation    Knee flexion 64* supine    Knee extension 16* supine with heel prop    Ankle dorsiflexion    Ankle plantarflexion    Ankle inversion    Ankle eversion     (Blank rows = not tested)  LOWER EXTREMITY MMT:  MMT Right eval Left eval  Hip flexion 3+ 4+  Hip extension    Hip  abduction    Hip adduction    Hip internal rotation    Hip external rotation    Knee flexion 4 4  Knee extension 2 4  Ankle dorsiflexion 5 5  Ankle plantarflexion    Ankle inversion    Ankle eversion     (Blank rows = not tested)    FUNCTIONAL TESTS:  5 times sit to stand: 43 seconds no UEs  Timed up and go (TUG): 19.5 seconds SPC  3 minute walk test: 319ft SPC   GAIT: Distance walked: 344ft Assistive device utilized: Single point cane Level of assistance: Modified independence Comments: slow but steady, able to make sharp turns well at each end of walking course, used SPC on wrong side, foot flat and limited heel-toe pattern, limited R knee flexion/extension in stance/swing phases    TODAY'S TREATMENT:  DATE:   Eval  Objective measures,  appropriate education/POC; she will bring HHPT HEP next session and we will update from there    PATIENT EDUCATION:  Education details: exam findings, POC, HEP  Person educated: Patient Education method: Medical illustrator Education comprehension: verbalized understanding, returned demonstration, and needs further education  HOME EXERCISE PROGRAM: Will update for her 2nd session   ASSESSMENT:  CLINICAL IMPRESSION: Patient is a 82 y.o. F who was seen today for physical therapy evaluation and treatment for skilled PT care s/p R TKR. Examination is typical and as expected, including limited R knee ROM, impaired functional muscle strength and endurance, gait and balance impairment, and localized edema. R LE did have a bit extra pitting edema but without color/temperature change and Homan's was negative, also she is on daily blood thinners (Xarelto) so concern for DVT is minimal. Will benefit from skilled PT services to address all impairments, reduce pain, and optimize overall level of function.    OBJECTIVE IMPAIRMENTS: Abnormal gait, decreased activity tolerance, decreased balance, decreased coordination, decreased mobility, difficulty walking, decreased ROM, decreased strength, hypomobility, increased edema, increased fascial restrictions, impaired flexibility, impaired UE functional use, and pain.   ACTIVITY LIMITATIONS: sitting, standing, squatting, stairs, transfers, bed mobility, and locomotion level  PARTICIPATION LIMITATIONS: cleaning, laundry, driving, shopping, community activity, occupation, and yard work  PERSONAL FACTORS: Age, Behavior pattern, Education, Fitness, Past/current experiences, Social background, and Time since onset of injury/illness/exacerbation are also affecting patient's functional outcome.   REHAB POTENTIAL: Good  CLINICAL DECISION MAKING: Stable/uncomplicated  EVALUATION COMPLEXITY: Low   GOALS: Goals reviewed with patient? Yes  SHORT TERM GOALS: Target date: 12/18/2022   Will be compliant with appropriate progressive HEP  Baseline: Goal status: INITIAL  2.  R knee AROM to be no more than 5 degrees extension and no less than 90 degrees flexion  Baseline:  Goal status: INITIAL  3.  Will be independent with management techniques for localized edema including wearing compression stockings and appropriate ice routine  Baseline:  Goal status: INITIAL  4.  Gait pattern to have normalized and she will demonstrate steady gait with use of SPC on appropriate side/good technique with SPC  Baseline:  Goal status: INITIAL   LONG TERM GOALS: Target date: 01/15/2023    MMT to have improved by 1 grade in all weak muscles  Baseline:  Goal status: INITIAL  2.  R knee flexion AROM to be at least 105 degrees  Baseline:  Goal status: INITIAL  3.  Will be able to ascend/descend steps with U rail and reciprocal pattern, no increase in pain  Baseline:  Goal status: INITIAL  4.  Will complete 5x sit to stand test in 20 seconds or less no UEs   Baseline:  Goal status: INITIAL  5.  Will complete TUG test in 13 seconds or less with LRAD to show improved functional balance Baseline:  Goal status: INITIAL  6.  Will ambulate at least 596ft with LRAD in to show improved mobility and community access  Baseline:  Goal status: INITIAL   PLAN:  PT FREQUENCY:  3x/week for 4 weeks, then drop to 2x/week for 4 weeks   PT DURATION: 8 weeks  PLANNED INTERVENTIONS: Therapeutic exercises, Therapeutic activity, Neuromuscular re-education, Balance training, Gait training, Patient/Family education, Self Care, Joint mobilization, Stair training, Orthotic/Fit training, DME instructions, Aquatic Therapy, Dry Needling, Electrical stimulation, Cryotherapy, Moist heat, Taping, Vasopneumatic device, Ultrasound, Ionotophoresis 4mg /ml Dexamethasone, Manual therapy, and Re-evaluation  PLAN FOR NEXT SESSION: she will  bring HHPT HEP next session, we need to update/progress it; work on knee ROM and correct gait pattern with cane, strength and edema management, manual PRN   Nedra Hai PT DPT PN2

## 2022-11-23 ENCOUNTER — Ambulatory Visit (INDEPENDENT_AMBULATORY_CARE_PROVIDER_SITE_OTHER): Payer: Medicare Other | Admitting: Physical Therapy

## 2022-11-23 ENCOUNTER — Encounter: Payer: Self-pay | Admitting: Physical Therapy

## 2022-11-23 DIAGNOSIS — R262 Difficulty in walking, not elsewhere classified: Secondary | ICD-10-CM | POA: Diagnosis not present

## 2022-11-23 DIAGNOSIS — M25561 Pain in right knee: Secondary | ICD-10-CM

## 2022-11-23 DIAGNOSIS — M6281 Muscle weakness (generalized): Secondary | ICD-10-CM

## 2022-11-23 DIAGNOSIS — M25661 Stiffness of right knee, not elsewhere classified: Secondary | ICD-10-CM

## 2022-11-23 DIAGNOSIS — R600 Localized edema: Secondary | ICD-10-CM | POA: Diagnosis not present

## 2022-11-23 NOTE — Therapy (Signed)
OUTPATIENT PHYSICAL THERAPY LOWER EXTREMITY TREATMENT   Patient Name: Melissa Maldonado MRN: [id number] DOB:March 21, 1941, 82 y.o., female Today's Date: 11/23/2022  END OF SESSION:  PT End of Session - 11/23/22 1155     Visit Number 2    Number of Visits 21    Date for PT Re-Evaluation 01/15/23    Authorization Type MCR and BCBS State    Authorization Time Period 11/20/22 to 01/15/23    Progress Note Due on Visit 10    PT Start Time 1151    PT Stop Time 1240    PT Time Calculation (min) 49 min    Activity Tolerance Patient tolerated treatment well    Behavior During Therapy [facility] for tasks assessed/performed             Past Medical History:  Diagnosis Date   Arthritis    Chronic back pain    lumbar stenosis   Dysrhythmia    Early cataracts, bilateral    GERD (gastroesophageal reflux disease)    occasionally but no meds required   History of blood transfusion    History of colon polyps    Hyperlipidemia    takes Lipitor every other day   Hypertension    takes losartan daily   Neuropathy    Osteoarthritis of left knee 04/27/2014   Restless leg    Shortness of breath    with exertion   SOB (shortness of breath) 2013   CPET   Tear of medial meniscus of left knee 09/08/2013   Past Surgical History:  Procedure Laterality Date   APPENDECTOMY     BACK SURGERY  07/20/2008   lumb fusion   COLONOSCOPY  2018   EYE SURGERY     both cataracts   KNEE ARTHROSCOPY WITH MEDIAL MENISECTOMY Left 09/08/2013   Procedure: LEFT KNEE ARTHROSCOPY WITH PARTIAL MEDIAL MENISCECTOMY;  Surgeon: Eulas Post, MD;  Location: [facility];  Service: Orthopedics;  Laterality: Left;   LUMBAR LAMINECTOMY/DECOMPRESSION MICRODISCECTOMY  05/13/2012   Procedure: LUMBAR LAMINECTOMY/DECOMPRESSION MICRODISCECTOMY 1 LEVEL;  Surgeon: Barnett Abu, MD;  Location: [facility];  Service: Neurosurgery;  Laterality: Bilateral;  Bilateral Lumbar one -two Decompressive Laminectomy   PARTIAL KNEE  ARTHROPLASTY Left 04/27/2014   Procedure: LEFT UNICOMPARTMENTAL KNEE;  Surgeon: Eulas Post, MD;  Location: [facility];  Service: Orthopedics;  Laterality: Left;   TOTAL KNEE ARTHROPLASTY Right 11/06/2022   Procedure: RIGHT TOTAL KNEE ARTHROPLASTY;  Surgeon: Kathryne Hitch, MD;  Location: [facility];  Service: Orthopedics;  Laterality: Right;   TUBAL LIGATION  1974   UPPER GI ENDOSCOPY     Patient Active Problem List   Diagnosis Date Noted   Status post total right knee replacement 11/06/2022   Unilateral primary osteoarthritis, right knee 09/07/2022   Arthritis of right acromioclavicular joint 08/28/2021   Impingement syndrome of right shoulder 08/19/2021   Nontraumatic incomplete tear of right rotator cuff 08/19/2021   Paresthesia 12/05/2020   Low back pain with right-sided sciatica 12/05/2020   Trochanteric bursitis, right hip 04/24/2020   Essential hypertension 01/12/2020   Sinus bradycardia 01/12/2020   Dyspnea on exertion 01/12/2020   Lumbar stenosis with neurogenic claudication 02/01/2017   Osteoarthritis of left knee 04/27/2014    PCP: Merri Brunette MD   REFERRING PROVIDER: Kathryne Hitch, MD  REFERRING DIAG: [phone] (ICD-10-CM) - Status post total right knee replacement M17.11 (ICD-10-CM) - Unilateral primary osteoarthritis, right knee  THERAPY DIAG:  Stiffness of right knee, not elsewhere  classified  Acute pain of right knee  Localized edema  Muscle weakness (generalized)  Difficulty in walking, not elsewhere classified  Rationale for Evaluation and Treatment: Rehabilitation  ONSET DATE: 11/09/2022  SUBJECTIVE:   SUBJECTIVE STATEMENT: She brought her exercises from [facility] as evaluating PT recommended.  She has been using a seated elliptical machine at home.     PERTINENT HISTORY: Hospital Course: Melissa Maldonado is an 82 y.o. female who was admitted 11/06/2022 for operative treatment ofUnilateral primary osteoarthritis, right  knee. Patient has severe unremitting pain that affects sleep, daily activities, and work/hobbies. After pre-op clearance the patient was taken to the operating room on 11/06/2022 and underwent  Procedure(s): RIGHT TOTAL KNEE ARTHROPLASTY.     PAIN:  Are you having pain? Yes: NPRS scale: **5/10 Pain location: R knee  Pain description: stabbing pain  Aggravating factors: bending Relieving factors: ice   PRECAUTIONS: None  WEIGHT BEARING RESTRICTIONS: No  FALLS:  Has patient fallen in last 6 months? No  LIVING ENVIRONMENT: Lives with: lives with their spouse Lives in: House/apartment Stairs: 4 STE B rails, split level home with 6 steps up/6 steps down U rail  Has following equipment at home: Single point cane, Walker - 2 wheeled, and toilet raiser   OCCUPATION: retired   PLOF: Independent, Independent with basic ADLs, Independent with gait, and Independent with transfers  PATIENT GOALS: be able to walk without pain  NEXT MD VISIT: Dr. Magnus Ivan May 30th   OBJECTIVE:   DIAGNOSTIC FINDINGS:   PATIENT SURVEYS:  FOTO 55, predicted 59  COGNITION: Overall cognitive status:  possible STM impairment, family not present to confirm       SENSATION: Not tested  EDEMA:   Some excessive pitting edema in surgical calf, no temperature or color changes, Homan's negative; on daily Xarelto per her report and per chart, she tells me she has been compliant with this medication   LOWER EXTREMITY ROM:  Active ROM Right eval Right 11/23/22  Hip flexion    Hip extension    Hip abduction    Hip adduction    Hip internal rotation    Hip external rotation    Knee flexion 64* supine  Seated P: 81*  Knee extension 16* supine with heel prop  Supine With heel prop P: -14*  Ankle dorsiflexion    Ankle plantarflexion    Ankle inversion    Ankle eversion     (Blank rows = not tested)  LOWER EXTREMITY MMT:  MMT Right eval Left eval  Hip flexion 3+ 4+  Hip extension    Hip  abduction    Hip adduction    Hip internal rotation    Hip external rotation    Knee flexion 4 4  Knee extension 2 4  Ankle dorsiflexion 5 5  Ankle plantarflexion    Ankle inversion    Ankle eversion     (Blank rows = not tested)    FUNCTIONAL TESTS:  5 times sit to stand: 43 seconds no UEs  Timed up and go (TUG): 19.5 seconds SPC  3 minute walk test: 353ft SPC   GAIT: Distance walked: 363ft Assistive device utilized: Single point cane Level of assistance: Modified independence Comments: slow but steady, able to make sharp turns well at each end of walking course, used SPC on wrong side, foot flat and limited heel-toe pattern, limited R knee flexion/extension in stance/swing phases    TODAY'S TREATMENT:  DATE:  11/23/2022: Therapeutic Exercise: Nustep seat 8 level 5 with BLEs/BUEs 10 min for ROM Standing heel raises with RW support 10 reps Standing knee flexion with RW support 10 reps Standing gastroc stretch with forefoot on 2" step with RW support 30 sec hold 1 rep Seated LAQ 5 sec hold 10 reps Seated heel slides with foot on pillow case - leg press / quad set 5 sec hold, active knee flexion then LLE assisted end range with hamstring set 5 sec for 10 reps Supine SAQ 5 sec hold 10 reps. PT used HHPT HEP and added gastroc stretch & hamstring stretch. See below.    Manual therapy Seated knee flexion PROM with overpressure and contract-relax  Vaso Right knee 34* medium compression 10 min with elevation.    Eval  Objective measures,  appropriate education/POC; she will bring HHPT HEP next session and we will update from there    PATIENT EDUCATION:  Education details: exam findings, POC, HEP  Person educated: Patient Education method: Medical illustrator Education comprehension: verbalized understanding, returned demonstration, and  needs further education  HOME EXERCISE PROGRAM: Access Code: [email or web address] URL: [email or web address] Date: 11/23/2022 Prepared by: Vladimir Faster  Exercises - Ankle Alphabet in Elevation  - 2-4 x daily - 7 x weekly - 1 sets - 1 reps - Supine Single Leg Ankle Pumps  - 1 x daily - 5 x weekly - 1 sets - 10 reps - 5 seconds hold - Quad Setting and Stretching  - 2-4 x daily - 7 x weekly - 5-10 sets - 10 reps - 5 sec hold - Supine Heel Slide with Strap  - 2-3 x daily - 7 x weekly - 2-3 sets - 10 reps - 5 seconds hold - Supine Knee Extension Strengthening  - 1 x daily - 7 x weekly - 2-3 sets - 10 reps - 5 seconds hold - Supine Straight Leg Raises  - 2-3 x daily - 7 x weekly - 2-3 sets - 10 reps - 5 seconds hold - Seated Knee Flexion Extension AROM   - 2-4 x daily - 7 x weekly - 2-3 sets - 10 reps - 5 seconds hold - Seated straight leg lifts  - 2-3 x daily - 7 x weekly - 2-3 sets - 10 reps - 5 seconds hold - Seated Hamstring Stretch with Strap  - 2-4 x daily - 7 x weekly - 1 sets - 3 reps - 20-30 seconds hold - Seated Long Arc Quad  - 1 x daily - 7 x weekly - 2-3 sets - 10 reps - 5 seconds hold - Heel raises near counter  - 1 x daily - 5 x weekly - 1 sets - 10 reps - 5 seconds hold - standing calf stretch with forefoot on small step or brick  - 1 x daily - 7 x weekly - 1 sets - 3 reps - 20-30 seconds hold - Standing Knee Flexion with Counter Support  - 1 x daily - 7 x weekly - 12-3 sets - 10 reps - 5 seconds hold - Standing March with Counter Support  - 1 x daily - 7 x weekly - 2-3 sets - 10 reps - 5 seconds hold - standing knee extension  - 1 x daily - 7 x weekly - 2-3 sets - 10 reps - 5 seconds hold   ASSESSMENT:  CLINICAL IMPRESSION: Patient continues to be limited in range by pain & edema.  PT updated HEP to [employer] including HH exercises and  added some stretches.  Patient continues to benefit from skilled PT.   OBJECTIVE IMPAIRMENTS: Abnormal gait, decreased activity tolerance,  decreased balance, decreased coordination, decreased mobility, difficulty walking, decreased ROM, decreased strength, hypomobility, increased edema, increased fascial restrictions, impaired flexibility, impaired UE functional use, and pain.   ACTIVITY LIMITATIONS: sitting, standing, squatting, stairs, transfers, bed mobility, and locomotion level  PARTICIPATION LIMITATIONS: cleaning, laundry, driving, shopping, community activity, occupation, and yard work  PERSONAL FACTORS: Age, Behavior pattern, Education, Fitness, Past/current experiences, Social background, and Time since onset of injury/illness/exacerbation are also affecting patient's functional outcome.   REHAB POTENTIAL: Good  CLINICAL DECISION MAKING: Stable/uncomplicated  EVALUATION COMPLEXITY: Low   GOALS: Goals reviewed with patient? Yes  SHORT TERM GOALS: Target date: 12/18/2022   Will be compliant with appropriate progressive HEP  Baseline: Goal status: ongoing 11/23/2022  2.  R knee AROM to be no more than 5 degrees extension and no less than 90 degrees flexion  Baseline:  Goal status: ongoing 11/23/2022  3.  Will be independent with management techniques for localized edema including wearing compression stockings and appropriate ice routine  Baseline:  Goal status: ongoing 11/23/2022  4.  Gait pattern to have normalized and she will demonstrate steady gait with use of SPC on appropriate side/good technique with SPC  Baseline:  Goal status: ongoing 11/23/2022   LONG TERM GOALS: Target date: 01/15/2023    MMT to have improved by 1 grade in all weak muscles  Baseline:  Goal status: INITIAL  2.  R knee flexion AROM to be at least 105 degrees  Baseline:  Goal status: INITIAL  3.  Will be able to ascend/descend steps with U rail and reciprocal pattern, no increase in pain  Baseline:  Goal status: INITIAL  4.  Will complete 5x sit to stand test in 20 seconds or less no UEs  Baseline:  Goal status: INITIAL  5.   Will complete TUG test in 13 seconds or less with LRAD to show improved functional balance Baseline:  Goal status: INITIAL  6.  Will ambulate at least 5107ft with LRAD in to show improved mobility and community access  Baseline:  Goal status: INITIAL   PLAN:  PT FREQUENCY:  3x/week for 4 weeks, then drop to 2x/week for 4 weeks   PT DURATION: 8 weeks  PLANNED INTERVENTIONS: Therapeutic exercises, Therapeutic activity, Neuromuscular re-education, Balance training, Gait training, Patient/Family education, Self Care, Joint mobilization, Stair training, Orthotic/Fit training, DME instructions, Aquatic Therapy, Dry Needling, Electrical stimulation, Cryotherapy, Moist heat, Taping, Vasopneumatic device, Ultrasound, Ionotophoresis 4mg /ml Dexamethasone, Manual therapy, and Re-evaluation  PLAN FOR NEXT SESSION:  manual therapy & exercise for range, vaso to end for edema,    Vladimir Faster, PT, DPT 11/23/2022, 12:43 PM

## 2022-11-25 ENCOUNTER — Encounter: Payer: Self-pay | Admitting: Physical Therapy

## 2022-11-25 ENCOUNTER — Ambulatory Visit (INDEPENDENT_AMBULATORY_CARE_PROVIDER_SITE_OTHER): Payer: Medicare Other | Admitting: Physical Therapy

## 2022-11-25 DIAGNOSIS — M6281 Muscle weakness (generalized): Secondary | ICD-10-CM

## 2022-11-25 DIAGNOSIS — R262 Difficulty in walking, not elsewhere classified: Secondary | ICD-10-CM | POA: Diagnosis not present

## 2022-11-25 DIAGNOSIS — M25561 Pain in right knee: Secondary | ICD-10-CM | POA: Diagnosis not present

## 2022-11-25 DIAGNOSIS — R600 Localized edema: Secondary | ICD-10-CM | POA: Diagnosis not present

## 2022-11-25 DIAGNOSIS — M25661 Stiffness of right knee, not elsewhere classified: Secondary | ICD-10-CM | POA: Diagnosis not present

## 2022-11-25 NOTE — Therapy (Signed)
OUTPATIENT PHYSICAL THERAPY LOWER EXTREMITY TREATMENT   Patient Name: Melissa Maldonado MRN: [id number] DOB:1940-11-05, 82 y.o., female Today's Date: 11/25/2022  END OF SESSION:  PT End of Session - 11/25/22 1011     Visit Number 3    Number of Visits 21    Date for PT Re-Evaluation 01/15/23    Authorization Type MCR and BCBS State    Authorization Time Period 11/20/22 to 01/15/23    Progress Note Due on Visit 10    PT Start Time 1010    PT Stop Time 1058    PT Time Calculation (min) 48 min    Activity Tolerance Patient tolerated treatment well    Behavior During Therapy [facility] for tasks assessed/performed              Past Medical History:  Diagnosis Date   Arthritis    Chronic back pain    lumbar stenosis   Dysrhythmia    Early cataracts, bilateral    GERD (gastroesophageal reflux disease)    occasionally but no meds required   History of blood transfusion    History of colon polyps    Hyperlipidemia    takes Lipitor every other day   Hypertension    takes losartan daily   Neuropathy    Osteoarthritis of left knee 04/27/2014   Restless leg    Shortness of breath    with exertion   SOB (shortness of breath) 2013   CPET   Tear of medial meniscus of left knee 09/08/2013   Past Surgical History:  Procedure Laterality Date   APPENDECTOMY     BACK SURGERY  07/20/2008   lumb fusion   COLONOSCOPY  2018   EYE SURGERY     both cataracts   KNEE ARTHROSCOPY WITH MEDIAL MENISECTOMY Left 09/08/2013   Procedure: LEFT KNEE ARTHROSCOPY WITH PARTIAL MEDIAL MENISCECTOMY;  Surgeon: Eulas Post, MD;  Location: [facility];  Service: Orthopedics;  Laterality: Left;   LUMBAR LAMINECTOMY/DECOMPRESSION MICRODISCECTOMY  05/13/2012   Procedure: LUMBAR LAMINECTOMY/DECOMPRESSION MICRODISCECTOMY 1 LEVEL;  Surgeon: Barnett Abu, MD;  Location: [facility];  Service: Neurosurgery;  Laterality: Bilateral;  Bilateral Lumbar one -two Decompressive Laminectomy   PARTIAL  KNEE ARTHROPLASTY Left 04/27/2014   Procedure: LEFT UNICOMPARTMENTAL KNEE;  Surgeon: Eulas Post, MD;  Location: [facility];  Service: Orthopedics;  Laterality: Left;   TOTAL KNEE ARTHROPLASTY Right 11/06/2022   Procedure: RIGHT TOTAL KNEE ARTHROPLASTY;  Surgeon: Kathryne Hitch, MD;  Location: [facility];  Service: Orthopedics;  Laterality: Right;   TUBAL LIGATION  1974   UPPER GI ENDOSCOPY     Patient Active Problem List   Diagnosis Date Noted   Status post total right knee replacement 11/06/2022   Unilateral primary osteoarthritis, right knee 09/07/2022   Arthritis of right acromioclavicular joint 08/28/2021   Impingement syndrome of right shoulder 08/19/2021   Nontraumatic incomplete tear of right rotator cuff 08/19/2021   Paresthesia 12/05/2020   Low back pain with right-sided sciatica 12/05/2020   Trochanteric bursitis, right hip 04/24/2020   Essential hypertension 01/12/2020   Sinus bradycardia 01/12/2020   Dyspnea on exertion 01/12/2020   Lumbar stenosis with neurogenic claudication 02/01/2017   Osteoarthritis of left knee 04/27/2014    PCP: Merri Brunette MD   REFERRING PROVIDER: Kathryne Hitch, MD  REFERRING DIAG: [phone] (ICD-10-CM) - Status post total right knee replacement M17.11 (ICD-10-CM) - Unilateral primary osteoarthritis, right knee  THERAPY DIAG:  Stiffness of right knee, not  elsewhere classified  Acute pain of right knee  Localized edema  Muscle weakness (generalized)  Difficulty in walking, not elsewhere classified  Rationale for Evaluation and Treatment: Rehabilitation  ONSET DATE: 11/09/2022  SUBJECTIVE:   SUBJECTIVE STATEMENT: She continues to do her exercises.  Her back has flared up since knee surgery. She had 3 back surgeries in past.   PERTINENT HISTORY: Hospital Course: Melissa Maldonado is an 83 y.o. female who was admitted 11/06/2022 for operative treatment ofUnilateral primary osteoarthritis, right knee.  Patient has severe unremitting pain that affects sleep, daily activities, and work/hobbies. After pre-op clearance the patient was taken to the operating room on 11/06/2022 and underwent  Procedure(s): RIGHT TOTAL KNEE ARTHROPLASTY.   PMH: OA, right rotator cuff tear, LBP w/ sciatica, lumbar stenosis, back surgery lumbar fusion & discectomy,  right hip bursitis, HEN, bradycardia,    PAIN:  Are you having pain?  Yes: NPRS scale: today 2-3/10 and over the last week lowest 1/10 & highest 7/10 Pain location: R knee  Pain description: stabbing pain  Aggravating factors: bending Relieving factors: ice   PRECAUTIONS: None  WEIGHT BEARING RESTRICTIONS: No  FALLS:  Has patient fallen in last 6 months? No  LIVING ENVIRONMENT: Lives with: lives with their spouse Lives in: House/apartment Stairs: 4 STE B rails, split level home with 6 steps up/6 steps down U rail  Has following equipment at home: Single point cane, Walker - 2 wheeled, and toilet raiser   OCCUPATION: retired   PLOF: Independent, Independent with basic ADLs, Independent with gait, and Independent with transfers  PATIENT GOALS: be able to walk without pain  NEXT MD VISIT: Dr. Magnus Ivan May 30th   OBJECTIVE:   DIAGNOSTIC FINDINGS:   PATIENT SURVEYS:  FOTO 55, predicted 68  COGNITION: Overall cognitive status:  possible STM impairment, family not present to confirm    SENSATION: Not tested  EDEMA:   Some excessive pitting edema in surgical calf, no temperature or color changes, Homan's negative; on daily Xarelto per her report and per chart, she tells me she has been compliant with this medication   LOWER EXTREMITY ROM:  Active ROM Right eval Right 11/23/22  Hip flexion    Hip extension    Hip abduction    Hip adduction    Hip internal rotation    Hip external rotation    Knee flexion 64* supine  Seated P: 81*  Knee extension 16* supine with heel prop  Supine With heel prop P: -14*  Ankle dorsiflexion     Ankle plantarflexion    Ankle inversion    Ankle eversion     (Blank rows = not tested)  LOWER EXTREMITY MMT:  MMT Right eval Left eval  Hip flexion 3+ 4+  Hip extension    Hip abduction    Hip adduction    Hip internal rotation    Hip external rotation    Knee flexion 4 4  Knee extension 2 4  Ankle dorsiflexion 5 5  Ankle plantarflexion    Ankle inversion    Ankle eversion     (Blank rows = not tested)    FUNCTIONAL TESTS:  5 times sit to stand: 43 seconds no UEs  Timed up and go (TUG): 19.5 seconds SPC  3 minute walk test: 367ft SPC   GAIT: Distance walked: 324ft Assistive device utilized: Single point cane Level of assistance: Modified independence Comments: slow but steady, able to make sharp turns well at each end of walking course, used  SPC on wrong side, foot flat and limited heel-toe pattern, limited R knee flexion/extension in stance/swing phases    TODAY'S TREATMENT:                                                                                                                              DATE:  11/25/2022: Therapeutic Exercise: Nustep seat 8 level 5 with BLEs/BUEs 10 min for ROM Standing gastroc stretch on incline board with BUE support 30 sec hold 2 repStanding heel raises on incline board with BUE support 10 reps Seated Hamstring stretch with strap DF 30 sec hold 2 reps Seated heel slide with foot on pillow case to reduce resistance for greater knee ext / quad set 5 sec, active knee flexion with LLE assisted end range stretch followed by hamstring isometric hamstring set 5 sec 10 reps Seated LAQ 5 sec hold & active knee flexion 5 sec hold with contralateral LE opposing motion 10 reps Quad stretch hooklying with strap knee flexion 30 sec hold 2 reps Supine SAQ 5 sec hold 10 reps.    Manual therapy Seated knee flexion PROM with overpressure and contract-relax Supine knee ext with quad set overpressure  Vaso dual hose due to whole leg edema Right knee  / ankle 34* medium compression 6 min with elevation. Stopped before 10 min due to back pain.   11/23/2022: Therapeutic Exercise: Nustep seat 8 level 5 with BLEs/BUEs 10 min for ROM Standing heel raises with RW support 10 reps Standing knee flexion with RW support 10 reps Standing gastroc stretch with forefoot on 2" step with RW support 30 sec hold 1 rep Seated LAQ 5 sec hold 10 reps Seated heel slides with foot on pillow case - leg press / quad set 5 sec hold, active knee flexion then LLE assisted end range with hamstring set 5 sec for 10 reps Supine SAQ 5 sec hold 10 reps. PT used HHPT HEP and added gastroc stretch & hamstring stretch. See below.    Manual therapy Seated knee flexion PROM with overpressure and contract-relax  Vaso Right knee 34* medium compression 10 min with elevation.    Eval  Objective measures,  appropriate education/POC; she will bring HHPT HEP next session and we will update from there    PATIENT EDUCATION:  Education details: exam findings, POC, HEP  Person educated: Patient Education method: Medical illustrator Education comprehension: verbalized understanding, returned demonstration, and needs further education  HOME EXERCISE PROGRAM: Access Code: [email or web address] URL: [email or web address] Date: 11/23/2022 Prepared by: Vladimir Faster  Exercises - Ankle Alphabet in Elevation  - 2-4 x daily - 7 x weekly - 1 sets - 1 reps - Supine Single Leg Ankle Pumps  - 1 x daily - 5 x weekly - 1 sets - 10 reps - 5 seconds hold - Quad Setting and Stretching  - 2-4 x daily - 7 x weekly - 5-10 sets - 10 reps - 5 sec hold - Supine Heel Slide  with Strap  - 2-3 x daily - 7 x weekly - 2-3 sets - 10 reps - 5 seconds hold - Supine Knee Extension Strengthening  - 1 x daily - 7 x weekly - 2-3 sets - 10 reps - 5 seconds hold - Supine Straight Leg Raises  - 2-3 x daily - 7 x weekly - 2-3 sets - 10 reps - 5 seconds hold - Seated Knee Flexion Extension AROM   -  2-4 x daily - 7 x weekly - 2-3 sets - 10 reps - 5 seconds hold - Seated straight leg lifts  - 2-3 x daily - 7 x weekly - 2-3 sets - 10 reps - 5 seconds hold - Seated Hamstring Stretch with Strap  - 2-4 x daily - 7 x weekly - 1 sets - 3 reps - 20-30 seconds hold - Seated Long Arc Quad  - 1 x daily - 7 x weekly - 2-3 sets - 10 reps - 5 seconds hold - Heel raises near counter  - 1 x daily - 5 x weekly - 1 sets - 10 reps - 5 seconds hold - standing calf stretch with forefoot on small step or brick  - 1 x daily - 7 x weekly - 1 sets - 3 reps - 20-30 seconds hold - Standing Knee Flexion with Counter Support  - 1 x daily - 7 x weekly - 12-3 sets - 10 reps - 5 seconds hold - Standing March with Counter Support  - 1 x daily - 7 x weekly - 2-3 sets - 10 reps - 5 seconds hold - standing knee extension  - 1 x daily - 7 x weekly - 2-3 sets - 10 reps - 5 seconds hold   ASSESSMENT: CLINICAL IMPRESSION: Patient continues to be limited in range by pain & edema.  Knee range end feel is pain limiting.  She only tolerates 1 set of muscle exercises due to pain.   Patient continues to benefit from skilled PT.   OBJECTIVE IMPAIRMENTS: Abnormal gait, decreased activity tolerance, decreased balance, decreased coordination, decreased mobility, difficulty walking, decreased ROM, decreased strength, hypomobility, increased edema, increased fascial restrictions, impaired flexibility, impaired UE functional use, and pain.   ACTIVITY LIMITATIONS: sitting, standing, squatting, stairs, transfers, bed mobility, and locomotion level  PARTICIPATION LIMITATIONS: cleaning, laundry, driving, shopping, community activity, occupation, and yard work  PERSONAL FACTORS: Age, Behavior pattern, Education, Fitness, Past/current experiences, Social background, and Time since onset of injury/illness/exacerbation are also affecting patient's functional outcome.   REHAB POTENTIAL: Good  CLINICAL DECISION MAKING:  Stable/uncomplicated  EVALUATION COMPLEXITY: Low   GOALS: Goals reviewed with patient? Yes  SHORT TERM GOALS: Target date: 12/18/2022   Will be compliant with appropriate progressive HEP  Baseline: Goal status: ongoing 11/23/2022  2.  R knee AROM to be no more than 5 degrees extension and no less than 90 degrees flexion  Baseline:  Goal status: ongoing 11/23/2022  3.  Will be independent with management techniques for localized edema including wearing compression stockings and appropriate ice routine  Baseline:  Goal status: ongoing 11/23/2022  4.  Gait pattern to have normalized and she will demonstrate steady gait with use of SPC on appropriate side/good technique with SPC  Baseline:  Goal status: ongoing 11/23/2022   LONG TERM GOALS: Target date: 01/15/2023    MMT to have improved by 1 grade in all weak muscles  Baseline:  Goal status: ongoing 11/25/2022  2.  R knee flexion AROM to be at least 105  degrees  Baseline:  Goal status: ongoing 11/25/2022  3.  Will be able to ascend/descend steps with U rail and reciprocal pattern, no increase in pain  Baseline:  Goal status: ongoing 11/25/2022  4.  Will complete 5x sit to stand test in 20 seconds or less no UEs  Baseline:  Goal status: ongoing 11/25/2022  5.  Will complete TUG test in 13 seconds or less with LRAD to show improved functional balance Baseline:  Goal status: ongoing 11/25/2022  6.  Will ambulate at least 586ft with LRAD in to show improved mobility and community access  Baseline:  Goal status: ongoing 11/25/2022   PLAN:  PT FREQUENCY:  3x/week for 4 weeks, then drop to 2x/week for 4 weeks   PT DURATION: 8 weeks  PLANNED INTERVENTIONS: Therapeutic exercises, Therapeutic activity, Neuromuscular re-education, Balance training, Gait training, Patient/Family education, Self Care, Joint mobilization, Stair training, Orthotic/Fit training, DME instructions, Aquatic Therapy, Dry Needling, Electrical stimulation,  Cryotherapy, Moist heat, Taping, Vasopneumatic device, Ultrasound, Ionotophoresis 4mg /ml Dexamethasone, Manual therapy, and Re-evaluation  PLAN FOR NEXT SESSION: add back exercises to decrease back pain limiting knee recovery, continue manual therapy & exercise for range, vaso to end for edema consider dual hose if edema in ankle area also.    Vladimir Faster, PT, DPT 11/25/2022, 11:03 AM

## 2022-11-27 ENCOUNTER — Telehealth: Payer: Self-pay | Admitting: *Deleted

## 2022-11-27 ENCOUNTER — Ambulatory Visit (INDEPENDENT_AMBULATORY_CARE_PROVIDER_SITE_OTHER): Payer: Medicare Other | Admitting: Physical Therapy

## 2022-11-27 ENCOUNTER — Encounter: Payer: Self-pay | Admitting: Physical Therapy

## 2022-11-27 ENCOUNTER — Other Ambulatory Visit: Payer: Self-pay | Admitting: Orthopaedic Surgery

## 2022-11-27 DIAGNOSIS — M25661 Stiffness of right knee, not elsewhere classified: Secondary | ICD-10-CM

## 2022-11-27 DIAGNOSIS — M25561 Pain in right knee: Secondary | ICD-10-CM

## 2022-11-27 DIAGNOSIS — M6281 Muscle weakness (generalized): Secondary | ICD-10-CM | POA: Diagnosis not present

## 2022-11-27 DIAGNOSIS — R600 Localized edema: Secondary | ICD-10-CM | POA: Diagnosis not present

## 2022-11-27 DIAGNOSIS — R262 Difficulty in walking, not elsewhere classified: Secondary | ICD-10-CM

## 2022-11-27 MED ORDER — OXYCODONE HCL 5 MG PO TABS: 5.0000 mg | ORAL_TABLET | Freq: Four times a day (QID) | ORAL | 0 refills | Status: DC | PRN

## 2022-11-27 NOTE — Therapy (Signed)
OUTPATIENT PHYSICAL THERAPY LOWER EXTREMITY TREATMENT   Patient Name: Melissa Maldonado MRN: [id number] DOB:[birth year], 82 y.o., female Today's Date: 11/27/2022  END OF SESSION:  PT End of Session - 11/27/22 1155     Visit Number 4    Number of Visits 21    Date for PT Re-Evaluation 01/15/23    Authorization Type MCR and BCBS State    Authorization Time Period 11/20/22 to 01/15/23    Progress Note Due on Visit 10    PT Start Time 1146    PT Stop Time 1227    PT Time Calculation (min) 41 min    Activity Tolerance Patient tolerated treatment well    Behavior During Therapy [facility] for tasks assessed/performed               Past Medical History:  Diagnosis Date   Arthritis    Chronic back pain    lumbar stenosis   Dysrhythmia    Early cataracts, bilateral    GERD (gastroesophageal reflux disease)    occasionally but no meds required   History of blood transfusion    History of colon polyps    Hyperlipidemia    takes Lipitor every other day   Hypertension    takes losartan daily   Neuropathy    Osteoarthritis of left knee 04/27/2014   Restless leg    Shortness of breath    with exertion   SOB (shortness of breath) 2013   CPET   Tear of medial meniscus of left knee 09/08/2013   Past Surgical History:  Procedure Laterality Date   APPENDECTOMY     BACK SURGERY  07/20/2008   lumb fusion   COLONOSCOPY  2018   EYE SURGERY     both cataracts   KNEE ARTHROSCOPY WITH MEDIAL MENISECTOMY Left 09/08/2013   Procedure: LEFT KNEE ARTHROSCOPY WITH PARTIAL MEDIAL MENISCECTOMY;  Surgeon: Eulas Post, MD;  Location: [facility];  Service: Orthopedics;  Laterality: Left;   LUMBAR LAMINECTOMY/DECOMPRESSION MICRODISCECTOMY  05/13/2012   Procedure: LUMBAR LAMINECTOMY/DECOMPRESSION MICRODISCECTOMY 1 LEVEL;  Surgeon: Barnett Abu, MD;  Location: [facility];  Service: Neurosurgery;  Laterality: Bilateral;  Bilateral Lumbar one -two Decompressive Laminectomy   PARTIAL  KNEE ARTHROPLASTY Left 04/27/2014   Procedure: LEFT UNICOMPARTMENTAL KNEE;  Surgeon: Eulas Post, MD;  Location:  SURGERY CENTER;  Service: Orthopedics;  Laterality: Left;   TOTAL KNEE ARTHROPLASTY Right 11/06/2022   Procedure: RIGHT TOTAL KNEE ARTHROPLASTY;  Surgeon: Kathryne Hitch, MD;  Location: [facility];  Service: Orthopedics;  Laterality: Right;   TUBAL LIGATION  1974   UPPER GI ENDOSCOPY     Patient Active Problem List   Diagnosis Date Noted   Status post total right knee replacement 11/06/2022   Unilateral primary osteoarthritis, right knee 09/07/2022   Arthritis of right acromioclavicular joint 08/28/2021   Impingement syndrome of right shoulder 08/19/2021   Nontraumatic incomplete tear of right rotator cuff 08/19/2021   Paresthesia 12/05/2020   Low back pain with right-sided sciatica 12/05/2020   Trochanteric bursitis, right hip 04/24/2020   Essential hypertension 01/12/2020   Sinus bradycardia 01/12/2020   Dyspnea on exertion 01/12/2020   Lumbar stenosis with neurogenic claudication 02/01/2017   Osteoarthritis of left knee 04/27/2014    PCP: Merri Brunette MD   REFERRING PROVIDER: Kathryne Hitch, MD  REFERRING DIAG: [phone] (ICD-10-CM) - Status post total right knee replacement M17.11 (ICD-10-CM) - Unilateral primary osteoarthritis, right knee  THERAPY DIAG:  Stiffness of right knee,  not elsewhere classified  Acute pain of right knee  Localized edema  Muscle weakness (generalized)  Difficulty in walking, not elsewhere classified  Rationale for Evaluation and Treatment: Rehabilitation  ONSET DATE: 11/09/2022  SUBJECTIVE:   SUBJECTIVE STATEMENT:  Things are OK, nothing really new just feeling tight this morning. Took pain medication before PT.   PERTINENT HISTORY: Hospital Course: Melissa Maldonado is an 82 y.o. female who was admitted 11/06/2022 for operative treatment ofUnilateral primary osteoarthritis, right knee. Patient has  severe unremitting pain that affects sleep, daily activities, and work/hobbies. After pre-op clearance the patient was taken to the operating room on 11/06/2022 and underwent  Procedure(s): RIGHT TOTAL KNEE ARTHROPLASTY.   PMH: OA, right rotator cuff tear, LBP w/ sciatica, lumbar stenosis, back surgery lumbar fusion & discectomy,  right hip bursitis, HEN, bradycardia,    PAIN:  Are you having pain?  Yes: NPRS scale: 3/10 Pain location: R knee  Pain description: "just a feeling, its there, not stabbing right now"  Aggravating factors: nothing  Relieving factors: ice, medicines    PRECAUTIONS: None  WEIGHT BEARING RESTRICTIONS: No  FALLS:  Has patient fallen in last 6 months? No  LIVING ENVIRONMENT: Lives with: lives with their spouse Lives in: House/apartment Stairs: 4 STE B rails, split level home with 6 steps up/6 steps down U rail  Has following equipment at home: Single point cane, Walker - 2 wheeled, and toilet raiser   OCCUPATION: retired   PLOF: Independent, Independent with basic ADLs, Independent with gait, and Independent with transfers  PATIENT GOALS: be able to walk without pain  NEXT MD VISIT: Dr. Magnus Ivan May 30th   OBJECTIVE:   DIAGNOSTIC FINDINGS:   PATIENT SURVEYS:  FOTO 55, predicted 75  COGNITION: Overall cognitive status:  possible STM impairment, family not present to confirm    SENSATION: Not tested  EDEMA:   Some excessive pitting edema in surgical calf, no temperature or color changes, Homan's negative; on daily Xarelto per her report and per chart, she tells me she has been compliant with this medication   LOWER EXTREMITY ROM:  Active ROM Right eval Right 11/23/22  Hip flexion    Hip extension    Hip abduction    Hip adduction    Hip internal rotation    Hip external rotation    Knee flexion 64* supine  Seated P: 81*  Knee extension 16* supine with heel prop  Supine With heel prop P: -14*  Ankle dorsiflexion    Ankle  plantarflexion    Ankle inversion    Ankle eversion     (Blank rows = not tested)  LOWER EXTREMITY MMT:  MMT Right eval Left eval  Hip flexion 3+ 4+  Hip extension    Hip abduction    Hip adduction    Hip internal rotation    Hip external rotation    Knee flexion 4 4  Knee extension 2 4  Ankle dorsiflexion 5 5  Ankle plantarflexion    Ankle inversion    Ankle eversion     (Blank rows = not tested)    FUNCTIONAL TESTS:  5 times sit to stand: 43 seconds no UEs  Timed up and go (TUG): 19.5 seconds SPC  3 minute walk test: 362ft SPC   GAIT: Distance walked: 342ft Assistive device utilized: Single point cane Level of assistance: Modified independence Comments: slow but steady, able to make sharp turns well at each end of walking course, used SPC on wrong side, foot  flat and limited heel-toe pattern, limited R knee flexion/extension in stance/swing phases    TODAY'S TREATMENT:                                                                                                                              DATE:    11/27/22  TherEx  Nustep x6 minutes for knee ROM SAQs 0# x15 focus on getting as much terminal knee extension as possible LAQs x12 0# 2 second holds  Self knee flexion stretch x10 with 3 second holds HS stretches seated edge of mat table on 6 inch box 3x30 seconds Knee extension stretch x3 minutes supine with heel prop and 2# on knee   Manual  Retrograde massage LEs elevated for edema control/lymph drainage      11/25/2022: Therapeutic Exercise: Nustep seat 8 level 5 with BLEs/BUEs 10 min for ROM Standing gastroc stretch on incline board with BUE support 30 sec hold 2 repStanding heel raises on incline board with BUE support 10 reps Seated Hamstring stretch with strap DF 30 sec hold 2 reps Seated heel slide with foot on pillow case to reduce resistance for greater knee ext / quad set 5 sec, active knee flexion with LLE assisted end range stretch followed  by hamstring isometric hamstring set 5 sec 10 reps Seated LAQ 5 sec hold & active knee flexion 5 sec hold with contralateral LE opposing motion 10 reps Quad stretch hooklying with strap knee flexion 30 sec hold 2 reps Supine SAQ 5 sec hold 10 reps.    Manual therapy Seated knee flexion PROM with overpressure and contract-relax Supine knee ext with quad set overpressure  Vaso dual hose due to whole leg edema Right knee / ankle 34* medium compression 6 min with elevation. Stopped before 10 min due to back pain.   11/23/2022: Therapeutic Exercise: Nustep seat 8 level 5 with BLEs/BUEs 10 min for ROM Standing heel raises with RW support 10 reps Standing knee flexion with RW support 10 reps Standing gastroc stretch with forefoot on 2" step with RW support 30 sec hold 1 rep Seated LAQ 5 sec hold 10 reps Seated heel slides with foot on pillow case - leg press / quad set 5 sec hold, active knee flexion then LLE assisted end range with hamstring set 5 sec for 10 reps Supine SAQ 5 sec hold 10 reps. PT used HHPT HEP and added gastroc stretch & hamstring stretch. See below.    Manual therapy Seated knee flexion PROM with overpressure and contract-relax  Vaso Right knee 34* medium compression 10 min with elevation.    Eval  Objective measures,  appropriate education/POC; she will bring HHPT HEP next session and we will update from there    PATIENT EDUCATION:  Education details: exam findings, POC, HEP  Person educated: Patient Education method: Medical illustrator Education comprehension: verbalized understanding, returned demonstration, and needs further education  HOME EXERCISE PROGRAM: Access Code: [email or web address] URL: [email or web address] Date: 11/23/2022  Prepared by: Vladimir Faster  Exercises - Ankle Alphabet in Elevation  - 2-4 x daily - 7 x weekly - 1 sets - 1 reps - Supine Single Leg Ankle Pumps  - 1 x daily - 5 x weekly - 1 sets - 10 reps - 5 seconds hold -  Quad Setting and Stretching  - 2-4 x daily - 7 x weekly - 5-10 sets - 10 reps - 5 sec hold - Supine Heel Slide with Strap  - 2-3 x daily - 7 x weekly - 2-3 sets - 10 reps - 5 seconds hold - Supine Knee Extension Strengthening  - 1 x daily - 7 x weekly - 2-3 sets - 10 reps - 5 seconds hold - Supine Straight Leg Raises  - 2-3 x daily - 7 x weekly - 2-3 sets - 10 reps - 5 seconds hold - Seated Knee Flexion Extension AROM   - 2-4 x daily - 7 x weekly - 2-3 sets - 10 reps - 5 seconds hold - Seated straight leg lifts  - 2-3 x daily - 7 x weekly - 2-3 sets - 10 reps - 5 seconds hold - Seated Hamstring Stretch with Strap  - 2-4 x daily - 7 x weekly - 1 sets - 3 reps - 20-30 seconds hold - Seated Long Arc Quad  - 1 x daily - 7 x weekly - 2-3 sets - 10 reps - 5 seconds hold - Heel raises near counter  - 1 x daily - 5 x weekly - 1 sets - 10 reps - 5 seconds hold - standing calf stretch with forefoot on small step or brick  - 1 x daily - 7 x weekly - 1 sets - 3 reps - 20-30 seconds hold - Standing Knee Flexion with Counter Support  - 1 x daily - 7 x weekly - 12-3 sets - 10 reps - 5 seconds hold - Standing March with Counter Support  - 1 x daily - 7 x weekly - 2-3 sets - 10 reps - 5 seconds hold - standing knee extension  - 1 x daily - 7 x weekly - 2-3 sets - 10 reps - 5 seconds hold   ASSESSMENT: CLINICAL IMPRESSION:  Ronnette arrives today doing OK, taking pain medication prior to PT as recommended but still pain limited at times. Continued work as tolerated on knee ROM and strengthening as back pain allowed. Will continue slow but steady efforts.   OBJECTIVE IMPAIRMENTS: Abnormal gait, decreased activity tolerance, decreased balance, decreased coordination, decreased mobility, difficulty walking, decreased ROM, decreased strength, hypomobility, increased edema, increased fascial restrictions, impaired flexibility, impaired UE functional use, and pain.   ACTIVITY LIMITATIONS: sitting, standing, squatting,  stairs, transfers, bed mobility, and locomotion level  PARTICIPATION LIMITATIONS: cleaning, laundry, driving, shopping, community activity, occupation, and yard work  PERSONAL FACTORS: Age, Behavior pattern, Education, Fitness, Past/current experiences, Social background, and Time since onset of injury/illness/exacerbation are also affecting patient's functional outcome.   REHAB POTENTIAL: Good  CLINICAL DECISION MAKING: Stable/uncomplicated  EVALUATION COMPLEXITY: Low   GOALS: Goals reviewed with patient? Yes  SHORT TERM GOALS: Target date: 12/18/2022   Will be compliant with appropriate progressive HEP  Baseline: Goal status: ongoing 11/23/2022  2.  R knee AROM to be no more than 5 degrees extension and no less than 90 degrees flexion  Baseline:  Goal status: ongoing 11/23/2022  3.  Will be independent with management techniques for localized edema including wearing compression stockings and appropriate ice routine  Baseline:  Goal status: ongoing 11/23/2022  4.  Gait pattern to have normalized and she will demonstrate steady gait with use of SPC on appropriate side/good technique with SPC  Baseline:  Goal status: ongoing 11/23/2022   LONG TERM GOALS: Target date: 01/15/2023    MMT to have improved by 1 grade in all weak muscles  Baseline:  Goal status: ongoing 11/25/2022  2.  R knee flexion AROM to be at least 105 degrees  Baseline:  Goal status: ongoing 11/25/2022  3.  Will be able to ascend/descend steps with U rail and reciprocal pattern, no increase in pain  Baseline:  Goal status: ongoing 11/25/2022  4.  Will complete 5x sit to stand test in 20 seconds or less no UEs  Baseline:  Goal status: ongoing 11/25/2022  5.  Will complete TUG test in 13 seconds or less with LRAD to show improved functional balance Baseline:  Goal status: ongoing 11/25/2022  6.  Will ambulate at least 560ft with LRAD in to show improved mobility and community access  Baseline:  Goal status:  ongoing 11/25/2022   PLAN:  PT FREQUENCY:  3x/week for 4 weeks, then drop to 2x/week for 4 weeks   PT DURATION: 8 weeks  PLANNED INTERVENTIONS: Therapeutic exercises, Therapeutic activity, Neuromuscular re-education, Balance training, Gait training, Patient/Family education, Self Care, Joint mobilization, Stair training, Orthotic/Fit training, DME instructions, Aquatic Therapy, Dry Needling, Electrical stimulation, Cryotherapy, Moist heat, Taping, Vasopneumatic device, Ultrasound, Ionotophoresis 4mg /ml Dexamethasone, Manual therapy, and Re-evaluation  PLAN FOR NEXT SESSION: add back exercises PRN to decrease back pain limiting knee recovery, continue manual therapy & exercise for range, vaso to end for edema consider dual hose if edema in ankle area also.   Nedra Hai PT DPT PN2

## 2022-11-27 NOTE — Telephone Encounter (Signed)
Patient requesting refill of pain medication. [facility]. Thank you.

## 2022-11-30 ENCOUNTER — Ambulatory Visit (INDEPENDENT_AMBULATORY_CARE_PROVIDER_SITE_OTHER): Payer: Medicare Other | Admitting: Physical Therapy

## 2022-11-30 ENCOUNTER — Encounter: Payer: Self-pay | Admitting: Physical Therapy

## 2022-11-30 DIAGNOSIS — M25661 Stiffness of right knee, not elsewhere classified: Secondary | ICD-10-CM | POA: Diagnosis not present

## 2022-11-30 DIAGNOSIS — R262 Difficulty in walking, not elsewhere classified: Secondary | ICD-10-CM

## 2022-11-30 DIAGNOSIS — M25561 Pain in right knee: Secondary | ICD-10-CM | POA: Diagnosis not present

## 2022-11-30 DIAGNOSIS — R600 Localized edema: Secondary | ICD-10-CM

## 2022-11-30 DIAGNOSIS — M6281 Muscle weakness (generalized): Secondary | ICD-10-CM

## 2022-11-30 NOTE — Therapy (Signed)
OUTPATIENT PHYSICAL THERAPY LOWER EXTREMITY TREATMENT   Patient Name: Melissa Maldonado MRN: [id number] DOB:09/24/1940, 82 y.o., female Today's Date: 11/30/2022  END OF SESSION:  PT End of Session - 11/30/22 1157     Visit Number 5    Number of Visits 21    Date for PT Re-Evaluation 01/15/23    Authorization Type MCR and BCBS State    Authorization Time Period 11/20/22 to 01/15/23    Progress Note Due on Visit 10    PT Start Time 1149    PT Stop Time 1227    PT Time Calculation (min) 38 min    Activity Tolerance Patient limited by pain    Behavior During Therapy [facility] for tasks assessed/performed                Past Medical History:  Diagnosis Date   Arthritis    Chronic back pain    lumbar stenosis   Dysrhythmia    Early cataracts, bilateral    GERD (gastroesophageal reflux disease)    occasionally but no meds required   History of blood transfusion    History of colon polyps    Hyperlipidemia    takes Lipitor every other day   Hypertension    takes losartan daily   Neuropathy    Osteoarthritis of left knee 04/27/2014   Restless leg    Shortness of breath    with exertion   SOB (shortness of breath) 2013   CPET   Tear of medial meniscus of left knee 09/08/2013   Past Surgical History:  Procedure Laterality Date   APPENDECTOMY     BACK SURGERY  07/20/2008   lumb fusion   COLONOSCOPY  2018   EYE SURGERY     both cataracts   KNEE ARTHROSCOPY WITH MEDIAL MENISECTOMY Left 09/08/2013   Procedure: LEFT KNEE ARTHROSCOPY WITH PARTIAL MEDIAL MENISCECTOMY;  Surgeon: Eulas Post, MD;  Location: [facility];  Service: Orthopedics;  Laterality: Left;   LUMBAR LAMINECTOMY/DECOMPRESSION MICRODISCECTOMY  05/13/2012   Procedure: LUMBAR LAMINECTOMY/DECOMPRESSION MICRODISCECTOMY 1 LEVEL;  Surgeon: Barnett Abu, MD;  Location: [facility];  Service: Neurosurgery;  Laterality: Bilateral;  Bilateral Lumbar one -two Decompressive Laminectomy   PARTIAL KNEE  ARTHROPLASTY Left 04/27/2014   Procedure: LEFT UNICOMPARTMENTAL KNEE;  Surgeon: Eulas Post, MD;  Location: [facility];  Service: Orthopedics;  Laterality: Left;   TOTAL KNEE ARTHROPLASTY Right 11/06/2022   Procedure: RIGHT TOTAL KNEE ARTHROPLASTY;  Surgeon: Kathryne Hitch, MD;  Location: [facility];  Service: Orthopedics;  Laterality: Right;   TUBAL LIGATION  1974   UPPER GI ENDOSCOPY     Patient Active Problem List   Diagnosis Date Noted   Status post total right knee replacement 11/06/2022   Unilateral primary osteoarthritis, right knee 09/07/2022   Arthritis of right acromioclavicular joint 08/28/2021   Impingement syndrome of right shoulder 08/19/2021   Nontraumatic incomplete tear of right rotator cuff 08/19/2021   Paresthesia 12/05/2020   Low back pain with right-sided sciatica 12/05/2020   Trochanteric bursitis, right hip 04/24/2020   Essential hypertension 01/12/2020   Sinus bradycardia 01/12/2020   Dyspnea on exertion 01/12/2020   Lumbar stenosis with neurogenic claudication 02/01/2017   Osteoarthritis of left knee 04/27/2014    PCP: Merri Brunette MD   REFERRING PROVIDER: Kathryne Hitch, MD  REFERRING DIAG: [phone] (ICD-10-CM) - Status post total right knee replacement M17.11 (ICD-10-CM) - Unilateral primary osteoarthritis, right knee  THERAPY DIAG:  Stiffness of right  knee, not elsewhere classified  Acute pain of right knee  Localized edema  Muscle weakness (generalized)  Difficulty in walking, not elsewhere classified  Rationale for Evaluation and Treatment: Rehabilitation  ONSET DATE: 11/09/2022  SUBJECTIVE:   SUBJECTIVE STATEMENT:  Doing OK, was able to get my refill over the weekend. In pain right now, did a lot of movement this morning. Went up and down the steps this morning, I think that's what caused it.   PERTINENT HISTORY: Hospital Course: Melissa Maldonado is an 82 y.o. female who was admitted 11/06/2022 for  operative treatment ofUnilateral primary osteoarthritis, right knee. Patient has severe unremitting pain that affects sleep, daily activities, and work/hobbies. After pre-op clearance the patient was taken to the operating room on 11/06/2022 and underwent  Procedure(s): RIGHT TOTAL KNEE ARTHROPLASTY.   PMH: OA, right rotator cuff tear, LBP w/ sciatica, lumbar stenosis, back surgery lumbar fusion & discectomy,  right hip bursitis, HEN, bradycardia,    PAIN:  Are you having pain?  Yes: NPRS scale: 6/10 Pain location: R knee  Pain description: nagging  Aggravating factors: doing too much  Relieving factors: ice, medicines    PRECAUTIONS: None  WEIGHT BEARING RESTRICTIONS: No  FALLS:  Has patient fallen in last 6 months? No  LIVING ENVIRONMENT: Lives with: lives with their spouse Lives in: House/apartment Stairs: 4 STE B rails, split level home with 6 steps up/6 steps down U rail  Has following equipment at home: Single point cane, Walker - 2 wheeled, and toilet raiser   OCCUPATION: retired   PLOF: Independent, Independent with basic ADLs, Independent with gait, and Independent with transfers  PATIENT GOALS: be able to walk without pain  NEXT MD VISIT: Dr. Magnus Ivan May 30th   OBJECTIVE:   DIAGNOSTIC FINDINGS:   PATIENT SURVEYS:  FOTO 55, predicted 13  COGNITION: Overall cognitive status:  possible STM impairment, family not present to confirm    SENSATION: Not tested  EDEMA:   Some excessive pitting edema in surgical calf, no temperature or color changes, Homan's negative; on daily Xarelto per her report and per chart, she tells me she has been compliant with this medication   LOWER EXTREMITY ROM:  Active ROM Right eval Right 11/23/22 Right 11/30/22  Hip flexion     Hip extension     Hip abduction     Hip adduction     Hip internal rotation     Hip external rotation     Knee flexion 64* supine  Seated P: 81* Seated 64-71* PROM and AAROM  Knee extension 16* supine  with heel prop  Supine With heel prop P: -14* Seated with foot supported on stool 9* PROM  Ankle dorsiflexion     Ankle plantarflexion     Ankle inversion     Ankle eversion      (Blank rows = not tested)  LOWER EXTREMITY MMT:  MMT Right eval Left eval  Hip flexion 3+ 4+  Hip extension    Hip abduction    Hip adduction    Hip internal rotation    Hip external rotation    Knee flexion 4 4  Knee extension 2 4  Ankle dorsiflexion 5 5  Ankle plantarflexion    Ankle inversion    Ankle eversion     (Blank rows = not tested)    FUNCTIONAL TESTS:  5 times sit to stand: 43 seconds no UEs  Timed up and go (TUG): 19.5 seconds SPC  3 minute walk test: 375ft  SPC   GAIT: Distance walked: 329ft Assistive device utilized: Single point cane Level of assistance: Modified independence Comments: slow but steady, able to make sharp turns well at each end of walking course, used SPC on wrong side, foot flat and limited heel-toe pattern, limited R knee flexion/extension in stance/swing phases    TODAY'S TREATMENT:                                                                                                                              DATE:   11/30/22  TherEx  Scifit bike seat 10 partial rotations for ROM x6 minutes Bridges with progressive knee flexion- attempted unable to perform due to back spasms  Knee extension stretch with foot on stool and 2# x4 minutes LAQS X2# R knee x15  Seated knee flexion stretch- knee bent to max tolerated ROM, hold for 30 seconds x3  Self AAROM for knee flexion x10    11/27/22  TherEx  Nustep x6 minutes for knee ROM SAQs 0# x15 focus on getting as much terminal knee extension as possible LAQs x12 0# 2 second holds  Self knee flexion stretch x10 with 3 second holds HS stretches seated edge of mat table on 6 inch box 3x30 seconds Knee extension stretch x3 minutes supine with heel prop and 2# on knee   Manual  Retrograde massage LEs  elevated for edema control/lymph drainage      11/25/2022: Therapeutic Exercise: Nustep seat 8 level 5 with BLEs/BUEs 10 min for ROM Standing gastroc stretch on incline board with BUE support 30 sec hold 2 repStanding heel raises on incline board with BUE support 10 reps Seated Hamstring stretch with strap DF 30 sec hold 2 reps Seated heel slide with foot on pillow case to reduce resistance for greater knee ext / quad set 5 sec, active knee flexion with LLE assisted end range stretch followed by hamstring isometric hamstring set 5 sec 10 reps Seated LAQ 5 sec hold & active knee flexion 5 sec hold with contralateral LE opposing motion 10 reps Quad stretch hooklying with strap knee flexion 30 sec hold 2 reps Supine SAQ 5 sec hold 10 reps.    Manual therapy Seated knee flexion PROM with overpressure and contract-relax Supine knee ext with quad set overpressure  Vaso dual hose due to whole leg edema Right knee / ankle 34* medium compression 6 min with elevation. Stopped before 10 min due to back pain.   11/23/2022: Therapeutic Exercise: Nustep seat 8 level 5 with BLEs/BUEs 10 min for ROM Standing heel raises with RW support 10 reps Standing knee flexion with RW support 10 reps Standing gastroc stretch with forefoot on 2" step with RW support 30 sec hold 1 rep Seated LAQ 5 sec hold 10 reps Seated heel slides with foot on pillow case - leg press / quad set 5 sec hold, active knee flexion then LLE assisted end range with hamstring set 5 sec for 10 reps  Supine SAQ 5 sec hold 10 reps. PT used HHPT HEP and added gastroc stretch & hamstring stretch. See below.    Manual therapy Seated knee flexion PROM with overpressure and contract-relax  Vaso Right knee 34* medium compression 10 min with elevation.    Eval  Objective measures,  appropriate education/POC; she will bring HHPT HEP next session and we will update from there    PATIENT EDUCATION:  Education details: exam findings, POC,  HEP  Person educated: Patient Education method: Medical illustrator Education comprehension: verbalized understanding, returned demonstration, and needs further education  HOME EXERCISE PROGRAM: Access Code: [email or web address] URL: [email or web address] Date: 11/23/2022 Prepared by: Vladimir Faster  Exercises - Ankle Alphabet in Elevation  - 2-4 x daily - 7 x weekly - 1 sets - 1 reps - Supine Single Leg Ankle Pumps  - 1 x daily - 5 x weekly - 1 sets - 10 reps - 5 seconds hold - Quad Setting and Stretching  - 2-4 x daily - 7 x weekly - 5-10 sets - 10 reps - 5 sec hold - Supine Heel Slide with Strap  - 2-3 x daily - 7 x weekly - 2-3 sets - 10 reps - 5 seconds hold - Supine Knee Extension Strengthening  - 1 x daily - 7 x weekly - 2-3 sets - 10 reps - 5 seconds hold - Supine Straight Leg Raises  - 2-3 x daily - 7 x weekly - 2-3 sets - 10 reps - 5 seconds hold - Seated Knee Flexion Extension AROM   - 2-4 x daily - 7 x weekly - 2-3 sets - 10 reps - 5 seconds hold - Seated straight leg lifts  - 2-3 x daily - 7 x weekly - 2-3 sets - 10 reps - 5 seconds hold - Seated Hamstring Stretch with Strap  - 2-4 x daily - 7 x weekly - 1 sets - 3 reps - 20-30 seconds hold - Seated Long Arc Quad  - 1 x daily - 7 x weekly - 2-3 sets - 10 reps - 5 seconds hold - Heel raises near counter  - 1 x daily - 5 x weekly - 1 sets - 10 reps - 5 seconds hold - standing calf stretch with forefoot on small step or brick  - 1 x daily - 7 x weekly - 1 sets - 3 reps - 20-30 seconds hold - Standing Knee Flexion with Counter Support  - 1 x daily - 7 x weekly - 12-3 sets - 10 reps - 5 seconds hold - Standing March with Counter Support  - 1 x daily - 7 x weekly - 2-3 sets - 10 reps - 5 seconds hold - standing knee extension  - 1 x daily - 7 x weekly - 2-3 sets - 10 reps - 5 seconds hold   ASSESSMENT: CLINICAL IMPRESSION:  Alyanna arrives today doing OK, having more pain this morning- she was really active before  coming to therapy, sounds like she may have overdone it a bit. Continued general focus on ROM and strength within pain limits today. Encouraged graded activity instead of pushing through pain barriers. Will continue steady efforts. Session was really limited today due to back pain- attempted multiple tasks in supine but unable to tolerate, ultimately only able to get through a few activities/exercises today due to back pain and L LE mm spasms.   OBJECTIVE IMPAIRMENTS: Abnormal gait, decreased activity tolerance, decreased balance, decreased coordination, decreased mobility, difficulty walking, decreased ROM, decreased  strength, hypomobility, increased edema, increased fascial restrictions, impaired flexibility, impaired UE functional use, and pain.   ACTIVITY LIMITATIONS: sitting, standing, squatting, stairs, transfers, bed mobility, and locomotion level  PARTICIPATION LIMITATIONS: cleaning, laundry, driving, shopping, community activity, occupation, and yard work  PERSONAL FACTORS: Age, Behavior pattern, Education, Fitness, Past/current experiences, Social background, and Time since onset of injury/illness/exacerbation are also affecting patient's functional outcome.   REHAB POTENTIAL: Good  CLINICAL DECISION MAKING: Stable/uncomplicated  EVALUATION COMPLEXITY: Low   GOALS: Goals reviewed with patient? Yes  SHORT TERM GOALS: Target date: 12/18/2022   Will be compliant with appropriate progressive HEP  Baseline: Goal status: ongoing 11/23/2022  2.  R knee AROM to be no more than 5 degrees extension and no less than 90 degrees flexion  Baseline:  Goal status: ongoing 11/23/2022  3.  Will be independent with management techniques for localized edema including wearing compression stockings and appropriate ice routine  Baseline:  Goal status: ongoing 11/23/2022  4.  Gait pattern to have normalized and she will demonstrate steady gait with use of SPC on appropriate side/good technique with SPC   Baseline:  Goal status: ongoing 11/23/2022   LONG TERM GOALS: Target date: 01/15/2023    MMT to have improved by 1 grade in all weak muscles  Baseline:  Goal status: ongoing 11/25/2022  2.  R knee flexion AROM to be at least 105 degrees  Baseline:  Goal status: ongoing 11/25/2022  3.  Will be able to ascend/descend steps with U rail and reciprocal pattern, no increase in pain  Baseline:  Goal status: ongoing 11/25/2022  4.  Will complete 5x sit to stand test in 20 seconds or less no UEs  Baseline:  Goal status: ongoing 11/25/2022  5.  Will complete TUG test in 13 seconds or less with LRAD to show improved functional balance Baseline:  Goal status: ongoing 11/25/2022  6.  Will ambulate at least 518ft with LRAD in to show improved mobility and community access  Baseline:  Goal status: ongoing 11/25/2022   PLAN:  PT FREQUENCY:  3x/week for 4 weeks, then drop to 2x/week for 4 weeks   PT DURATION: 8 weeks  PLANNED INTERVENTIONS: Therapeutic exercises, Therapeutic activity, Neuromuscular re-education, Balance training, Gait training, Patient/Family education, Self Care, Joint mobilization, Stair training, Orthotic/Fit training, DME instructions, Aquatic Therapy, Dry Needling, Electrical stimulation, Cryotherapy, Moist heat, Taping, Vasopneumatic device, Ultrasound, Ionotophoresis 4mg /ml Dexamethasone, Manual therapy, and Re-evaluation  PLAN FOR NEXT SESSION: add back exercises as needed to decrease back pain limiting knee recovery, continue manual therapy & exercise for range, vaso to end for edema consider dual hose if edema in ankle area also.   Nedra Hai PT DPT PN2

## 2022-12-02 ENCOUNTER — Ambulatory Visit (INDEPENDENT_AMBULATORY_CARE_PROVIDER_SITE_OTHER): Payer: Medicare Other | Admitting: Physical Therapy

## 2022-12-02 ENCOUNTER — Encounter: Payer: Self-pay | Admitting: Physical Therapy

## 2022-12-02 DIAGNOSIS — R600 Localized edema: Secondary | ICD-10-CM | POA: Diagnosis not present

## 2022-12-02 DIAGNOSIS — R262 Difficulty in walking, not elsewhere classified: Secondary | ICD-10-CM | POA: Diagnosis not present

## 2022-12-02 DIAGNOSIS — M25661 Stiffness of right knee, not elsewhere classified: Secondary | ICD-10-CM | POA: Diagnosis not present

## 2022-12-02 DIAGNOSIS — M25561 Pain in right knee: Secondary | ICD-10-CM

## 2022-12-02 DIAGNOSIS — M6281 Muscle weakness (generalized): Secondary | ICD-10-CM | POA: Diagnosis not present

## 2022-12-02 NOTE — Therapy (Signed)
OUTPATIENT PHYSICAL THERAPY LOWER EXTREMITY TREATMENT   Patient Name: [name] MRN: [id number] DOB:04/01/1941, 82 y.o., female Today's Date: 12/02/2022  END OF SESSION:  PT End of Session - 12/02/22 1058     Visit Number 6    Number of Visits 21    Date for PT Re-Evaluation 01/15/23    Authorization Type MCR and BCBS State    Authorization Time Period 11/20/22 to 01/15/23    Progress Note Due on Visit 10    PT Start Time 1058    PT Stop Time 1150    PT Time Calculation (min) 52 min    Activity Tolerance Patient limited by pain    Behavior During Therapy [facility] for tasks assessed/performed                Past Medical History:  Diagnosis Date   Arthritis    Chronic back pain    lumbar stenosis   Dysrhythmia    Early cataracts, bilateral    GERD (gastroesophageal reflux disease)    occasionally but no meds required   History of blood transfusion    History of colon polyps    Hyperlipidemia    takes Lipitor every other day   Hypertension    takes losartan daily   Neuropathy    Osteoarthritis of left knee 04/27/2014   Restless leg    Shortness of breath    with exertion   SOB (shortness of breath) 2013   CPET   Tear of medial meniscus of left knee 09/08/2013   Past Surgical History:  Procedure Laterality Date   APPENDECTOMY     BACK SURGERY  07/20/2008   lumb fusion   COLONOSCOPY  2018   EYE SURGERY     both cataracts   KNEE ARTHROSCOPY WITH MEDIAL MENISECTOMY Left 09/08/2013   Procedure: LEFT KNEE ARTHROSCOPY WITH PARTIAL MEDIAL MENISCECTOMY;  Surgeon: Eulas Post, MD;  Location: [facility];  Service: Orthopedics;  Laterality: Left;   LUMBAR LAMINECTOMY/DECOMPRESSION MICRODISCECTOMY  05/13/2012   Procedure: LUMBAR LAMINECTOMY/DECOMPRESSION MICRODISCECTOMY 1 LEVEL;  Surgeon: Barnett Abu, MD;  Location: [facility];  Service: Neurosurgery;  Laterality: Bilateral;  Bilateral Lumbar one -two Decompressive Laminectomy   PARTIAL KNEE  ARTHROPLASTY Left 04/27/2014   Procedure: LEFT UNICOMPARTMENTAL KNEE;  Surgeon: Eulas Post, MD;  Location: [facility];  Service: Orthopedics;  Laterality: Left;   TOTAL KNEE ARTHROPLASTY Right 11/06/2022   Procedure: RIGHT TOTAL KNEE ARTHROPLASTY;  Surgeon: Kathryne Hitch, MD;  Location: [facility];  Service: Orthopedics;  Laterality: Right;   TUBAL LIGATION  1974   UPPER GI ENDOSCOPY     Patient Active Problem List   Diagnosis Date Noted   Status post total right knee replacement 11/06/2022   Unilateral primary osteoarthritis, right knee 09/07/2022   Arthritis of right acromioclavicular joint 08/28/2021   Impingement syndrome of right shoulder 08/19/2021   Nontraumatic incomplete tear of right rotator cuff 08/19/2021   Paresthesia 12/05/2020   Low back pain with right-sided sciatica 12/05/2020   Trochanteric bursitis, right hip 04/24/2020   Essential hypertension 01/12/2020   Sinus bradycardia 01/12/2020   Dyspnea on exertion 01/12/2020   Lumbar stenosis with neurogenic claudication 02/01/2017   Osteoarthritis of left knee 04/27/2014    PCP: Merri Brunette MD   REFERRING PROVIDER: Kathryne Hitch, MD  REFERRING DIAG: [id number] (ICD-10-CM) - Status post total right knee replacement M17.11 (ICD-10-CM) - Unilateral primary osteoarthritis, right knee  THERAPY DIAG:  Stiffness of right  knee, not elsewhere classified  Acute pain of right knee  Localized edema  Difficulty in walking, not elsewhere classified  Muscle weakness (generalized)  Rationale for Evaluation and Treatment: Rehabilitation  ONSET DATE: 11/09/2022  SUBJECTIVE:   SUBJECTIVE STATEMENT: She is having trouble sleeping on her back.    PERTINENT HISTORY: Hospital Course: Melissa Maldonado is an 82 y.o. female who was admitted 11/06/2022 for operative treatment ofUnilateral primary osteoarthritis, right knee. Patient has severe unremitting pain that affects sleep, daily activities,  and work/hobbies. After pre-op clearance the patient was taken to the operating room on 11/06/2022 and underwent  Procedure(s): RIGHT TOTAL KNEE ARTHROPLASTY.   PMH: OA, right rotator cuff tear, LBP w/ sciatica, lumbar stenosis, back surgery lumbar fusion & discectomy,  right hip bursitis, HEN, bradycardia,    PAIN:  Are you having pain?  Yes: NPRS scale:  today 2/10 took meds prior to PT and in last week 2/10 - 8/10 Pain location: R knee  Pain description: nagging  Aggravating factors: doing too much  Relieving factors: ice, medicines    PRECAUTIONS: None  WEIGHT BEARING RESTRICTIONS: No  FALLS:  Has patient fallen in last 6 months? No  LIVING ENVIRONMENT: Lives with: lives with their spouse Lives in: House/apartment Stairs: 4 STE B rails, split level home with 6 steps up/6 steps down U rail  Has following equipment at home: Single point cane, Walker - 2 wheeled, and toilet raiser   OCCUPATION: retired   PLOF: Independent, Independent with basic ADLs, Independent with gait, and Independent with transfers  PATIENT GOALS: be able to walk without pain  NEXT MD VISIT: Dr. Magnus Ivan May 30th   OBJECTIVE:   DIAGNOSTIC FINDINGS:   PATIENT SURVEYS:  FOTO 55, predicted 10  COGNITION: Overall cognitive status:  possible STM impairment, family not present to confirm    SENSATION: Not tested  EDEMA:   Some excessive pitting edema in surgical calf, no temperature or color changes, Homan's negative; on daily Xarelto per her report and per chart, she tells me she has been compliant with this medication   LOWER EXTREMITY ROM:  Active ROM Right eval Right 11/23/22 Right  11/30/22  Hip flexion     Hip extension     Hip abduction     Hip adduction     Hip internal rotation     Hip external rotation     Knee flexion 64* supine  Seated P: 81* Seated 64-71* PROM and AAROM  Knee extension 16* supine with heel prop  Supine With heel prop P: -14* Seated with foot supported on  stool 9* PROM  Ankle dorsiflexion     Ankle plantarflexion     Ankle inversion     Ankle eversion      (Blank rows = not tested)  LOWER EXTREMITY MMT:  MMT Right eval Left eval  Hip flexion 3+ 4+  Hip extension    Hip abduction    Hip adduction    Hip internal rotation    Hip external rotation    Knee flexion 4 4  Knee extension 2 4  Ankle dorsiflexion 5 5  Ankle plantarflexion    Ankle inversion    Ankle eversion     (Blank rows = not tested)    FUNCTIONAL TESTS:  5 times sit to stand: 43 seconds no UEs  Timed up and go (TUG): 19.5 seconds SPC  3 minute walk test: 375ft SPC   GAIT: Distance walked: 358ft Assistive device utilized: Single point cane  Level of assistance: Modified independence Comments: slow but steady, able to make sharp turns well at each end of walking course, used SPC on wrong side, foot flat and limited heel-toe pattern, limited R knee flexion/extension in stance/swing phases    TODAY'S TREATMENT:                                                                                                                              DATE:  12/02/2022: Therapeutic Exercise: Seated LAQ & active knee flexion with LLE opposing motion 5 sec hold 10 reps 2 sets Standing TKE red theraband 10 reps 2 sets with PT tactile cues for quad activation Scifit bike seat 10 partial rotations for 1 min then full revolutions for ROM x 6 minutes Bridge 5 reps with BLEs even foot position Knee to chest stretch 15 sec hold 2 reps ea LE, PT AA with LLE  Manual Therapy PROM & contract relax for knee flexion seated & extension supine   Therapeutic Activities Tandem stance 30 sec RLE in front with intermittent touch and in back with tactile/minA Pt amb 50' X 3 with cane with cues on technique and intermittent minA for balance.  Vaso LLE dual hose ankle & knee medium compression 34* 5 min only (pt request) with elevation  11/30/22  TherEx  Scifit bike seat 10 partial  rotations for ROM x6 minutes Bridges with progressive knee flexion- attempted unable to perform due to back spasms  Knee extension stretch with foot on stool and 2# x4 minutes LAQS X2# R knee x15  Seated knee flexion stretch- knee bent to max tolerated ROM, hold for 30 seconds x3  Self AAROM for knee flexion x10    11/27/22  TherEx  Nustep x6 minutes for knee ROM SAQs 0# x15 focus on getting as much terminal knee extension as possible LAQs x12 0# 2 second holds  Self knee flexion stretch x10 with 3 second holds HS stretches seated edge of mat table on 6 inch box 3x30 seconds Knee extension stretch x3 minutes supine with heel prop and 2# on knee   Manual  Retrograde massage LEs elevated for edema control/lymph drainage    HOME EXERCISE PROGRAM: Access Code: [email or web address] URL: [email or web address] Date: 11/23/2022 Prepared by: Vladimir Faster  Exercises - Ankle Alphabet in Elevation  - 2-4 x daily - 7 x weekly - 1 sets - 1 reps - Supine Single Leg Ankle Pumps  - 1 x daily - 5 x weekly - 1 sets - 10 reps - 5 seconds hold - Quad Setting and Stretching  - 2-4 x daily - 7 x weekly - 5-10 sets - 10 reps - 5 sec hold - Supine Heel Slide with Strap  - 2-3 x daily - 7 x weekly - 2-3 sets - 10 reps - 5 seconds hold - Supine Knee Extension Strengthening  - 1 x daily - 7 x weekly - 2-3 sets - 10 reps - 5 seconds hold -  Supine Straight Leg Raises  - 2-3 x daily - 7 x weekly - 2-3 sets - 10 reps - 5 seconds hold - Seated Knee Flexion Extension AROM   - 2-4 x daily - 7 x weekly - 2-3 sets - 10 reps - 5 seconds hold - Seated straight leg lifts  - 2-3 x daily - 7 x weekly - 2-3 sets - 10 reps - 5 seconds hold - Seated Hamstring Stretch with Strap  - 2-4 x daily - 7 x weekly - 1 sets - 3 reps - 20-30 seconds hold - Seated Long Arc Quad  - 1 x daily - 7 x weekly - 2-3 sets - 10 reps - 5 seconds hold - Heel raises near counter  - 1 x daily - 5 x weekly - 1 sets - 10 reps - 5 seconds  hold - standing calf stretch with forefoot on small step or brick  - 1 x daily - 7 x weekly - 1 sets - 3 reps - 20-30 seconds hold - Standing Knee Flexion with Counter Support  - 1 x daily - 7 x weekly - 12-3 sets - 10 reps - 5 seconds hold - Standing March with Counter Support  - 1 x daily - 7 x weekly - 2-3 sets - 10 reps - 5 seconds hold - standing knee extension  - 1 x daily - 7 x weekly - 2-3 sets - 10 reps - 5 seconds hold   ASSESSMENT: CLINICAL IMPRESSION: Patient continues to be limited by pain in knee & back.  She did progress functional range to enable full revolution on SciFit bike.  PT introduced cane gait today but needs more work prior to trying outside of PT. Pt continues to need skilled PT to progress range & strength.   OBJECTIVE IMPAIRMENTS: Abnormal gait, decreased activity tolerance, decreased balance, decreased coordination, decreased mobility, difficulty walking, decreased ROM, decreased strength, hypomobility, increased edema, increased fascial restrictions, impaired flexibility, impaired UE functional use, and pain.   ACTIVITY LIMITATIONS: sitting, standing, squatting, stairs, transfers, bed mobility, and locomotion level  PARTICIPATION LIMITATIONS: cleaning, laundry, driving, shopping, community activity, occupation, and yard work  PERSONAL FACTORS: Age, Behavior pattern, Education, Fitness, Past/current experiences, Social background, and Time since onset of injury/illness/exacerbation are also affecting patient's functional outcome.   REHAB POTENTIAL: Good  CLINICAL DECISION MAKING: Stable/uncomplicated  EVALUATION COMPLEXITY: Low   GOALS: Goals reviewed with patient? Yes  SHORT TERM GOALS: Target date: 12/18/2022   Will be compliant with appropriate progressive HEP  Baseline: Goal status: ongoing 11/23/2022  2.  R knee AROM to be no more than 5 degrees extension and no less than 90 degrees flexion  Baseline:  Goal status: ongoing 11/23/2022  3.  Will be  independent with management techniques for localized edema including wearing compression stockings and appropriate ice routine  Baseline:  Goal status: ongoing 11/23/2022  4.  Gait pattern to have normalized and she will demonstrate steady gait with use of SPC on appropriate side/good technique with SPC  Baseline:  Goal status: ongoing 11/23/2022   LONG TERM GOALS: Target date: 01/15/2023    MMT to have improved by 1 grade in all weak muscles  Baseline:  Goal status: ongoing 11/25/2022  2.  R knee flexion AROM to be at least 105 degrees  Baseline:  Goal status: ongoing 11/25/2022  3.  Will be able to ascend/descend steps with U rail and reciprocal pattern, no increase in pain  Baseline:  Goal status: ongoing 11/25/2022  4.  Will complete 5x sit to stand test in 20 seconds or less no UEs  Baseline:  Goal status: ongoing 11/25/2022  5.  Will complete TUG test in 13 seconds or less with LRAD to show improved functional balance Baseline:  Goal status: ongoing 11/25/2022  6.  Will ambulate at least 570ft with LRAD in to show improved mobility and community access  Baseline:  Goal status: ongoing 11/25/2022   PLAN:  PT FREQUENCY:  3x/week for 4 weeks, then drop to 2x/week for 4 weeks   PT DURATION: 8 weeks  PLANNED INTERVENTIONS: Therapeutic exercises, Therapeutic activity, Neuromuscular re-education, Balance training, Gait training, Patient/Family education, Self Care, Joint mobilization, Stair training, Orthotic/Fit training, DME instructions, Aquatic Therapy, Dry Needling, Electrical stimulation, Cryotherapy, Moist heat, Taping, Vasopneumatic device, Ultrasound, Ionotophoresis 4mg /ml Dexamethasone, Manual therapy, and Re-evaluation  PLAN FOR NEXT SESSION:  continue manual therapy & exercise for range, vaso to end for edema consider dual hose if edema in ankle area also.    Vladimir Faster, PT, DPT 12/02/2022, 12:51 PM

## 2022-12-04 ENCOUNTER — Encounter: Payer: Self-pay | Admitting: Physical Therapy

## 2022-12-04 ENCOUNTER — Ambulatory Visit (INDEPENDENT_AMBULATORY_CARE_PROVIDER_SITE_OTHER): Payer: Medicare Other | Admitting: Physical Therapy

## 2022-12-04 DIAGNOSIS — R262 Difficulty in walking, not elsewhere classified: Secondary | ICD-10-CM

## 2022-12-04 DIAGNOSIS — M25561 Pain in right knee: Secondary | ICD-10-CM | POA: Diagnosis not present

## 2022-12-04 DIAGNOSIS — R600 Localized edema: Secondary | ICD-10-CM

## 2022-12-04 DIAGNOSIS — M25661 Stiffness of right knee, not elsewhere classified: Secondary | ICD-10-CM | POA: Diagnosis not present

## 2022-12-04 DIAGNOSIS — M6281 Muscle weakness (generalized): Secondary | ICD-10-CM | POA: Diagnosis not present

## 2022-12-04 NOTE — Therapy (Signed)
OUTPATIENT PHYSICAL THERAPY LOWER EXTREMITY TREATMENT   Patient Name: Melissa Maldonado MRN: [id number] DOB:Jul 13, 1941, 82 y.o., female Today's Date: 12/04/2022  END OF SESSION:  PT End of Session - 12/04/22 1156     Visit Number 7    Number of Visits 21    Date for PT Re-Evaluation 01/15/23    Authorization Type MCR and BCBS State    Authorization Time Period 11/20/22 to 01/15/23    Progress Note Due on Visit 10    PT Start Time 1146    PT Stop Time 1226    PT Time Calculation (min) 40 min    Activity Tolerance Patient limited by pain    Behavior During Therapy [facility] for tasks assessed/performed                 Past Medical History:  Diagnosis Date   Arthritis    Chronic back pain    lumbar stenosis   Dysrhythmia    Early cataracts, bilateral    GERD (gastroesophageal reflux disease)    occasionally but no meds required   History of blood transfusion    History of colon polyps    Hyperlipidemia    takes Lipitor every other day   Hypertension    takes losartan daily   Neuropathy    Osteoarthritis of left knee 04/27/2014   Restless leg    Shortness of breath    with exertion   SOB (shortness of breath) 2013   CPET   Tear of medial meniscus of left knee 09/08/2013   Past Surgical History:  Procedure Laterality Date   APPENDECTOMY     BACK SURGERY  07/20/2008   lumb fusion   COLONOSCOPY  2018   EYE SURGERY     both cataracts   KNEE ARTHROSCOPY WITH MEDIAL MENISECTOMY Left 09/08/2013   Procedure: LEFT KNEE ARTHROSCOPY WITH PARTIAL MEDIAL MENISCECTOMY;  Surgeon: Eulas Post, MD;  Location: [facility];  Service: Orthopedics;  Laterality: Left;   LUMBAR LAMINECTOMY/DECOMPRESSION MICRODISCECTOMY  05/13/2012   Procedure: LUMBAR LAMINECTOMY/DECOMPRESSION MICRODISCECTOMY 1 LEVEL;  Surgeon: Barnett Abu, MD;  Location: [facility];  Service: Neurosurgery;  Laterality: Bilateral;  Bilateral Lumbar one -two Decompressive Laminectomy   PARTIAL KNEE  ARTHROPLASTY Left 04/27/2014   Procedure: LEFT UNICOMPARTMENTAL KNEE;  Surgeon: Eulas Post, MD;  Location: [facility];  Service: Orthopedics;  Laterality: Left;   TOTAL KNEE ARTHROPLASTY Right 11/06/2022   Procedure: RIGHT TOTAL KNEE ARTHROPLASTY;  Surgeon: Kathryne Hitch, MD;  Location: [facility];  Service: Orthopedics;  Laterality: Right;   TUBAL LIGATION  1974   UPPER GI ENDOSCOPY     Patient Active Problem List   Diagnosis Date Noted   Status post total right knee replacement 11/06/2022   Unilateral primary osteoarthritis, right knee 09/07/2022   Arthritis of right acromioclavicular joint 08/28/2021   Impingement syndrome of right shoulder 08/19/2021   Nontraumatic incomplete tear of right rotator cuff 08/19/2021   Paresthesia 12/05/2020   Low back pain with right-sided sciatica 12/05/2020   Trochanteric bursitis, right hip 04/24/2020   Essential hypertension 01/12/2020   Sinus bradycardia 01/12/2020   Dyspnea on exertion 01/12/2020   Lumbar stenosis with neurogenic claudication 02/01/2017   Osteoarthritis of left knee 04/27/2014    PCP: Merri Brunette MD   REFERRING PROVIDER: Kathryne Hitch, MD  REFERRING DIAG: [phone] (ICD-10-CM) - Status post total right knee replacement M17.11 (ICD-10-CM) - Unilateral primary osteoarthritis, right knee  THERAPY DIAG:  Stiffness of  right knee, not elsewhere classified  Acute pain of right knee  Localized edema  Muscle weakness (generalized)  Difficulty in walking, not elsewhere classified  Rationale for Evaluation and Treatment: Rehabilitation  ONSET DATE: 11/09/2022  SUBJECTIVE:   SUBJECTIVE STATEMENT:  Back is killing me today, but I changed my way of sleeping last night so we'll see if it helped.  Nothing new, keeping up with ice and execises.   PERTINENT HISTORY: Hospital Course: Melissa Maldonado is an 82 y.o. female who was admitted 11/06/2022 for operative treatment ofUnilateral primary  osteoarthritis, right knee. Patient has severe unremitting pain that affects sleep, daily activities, and work/hobbies. After pre-op clearance the patient was taken to the operating room on 11/06/2022 and underwent  Procedure(s): RIGHT TOTAL KNEE ARTHROPLASTY.   PMH: OA, right rotator cuff tear, LBP w/ sciatica, lumbar stenosis, back surgery lumbar fusion & discectomy,  right hip bursitis, HEN, bradycardia,    PAIN:  Are you having pain?  Yes: NPRS scale:  0-1/10 Pain location: R knee  Pain description: soreness Aggravating factors: doing too much  Relieving factors: ice, medicines    PRECAUTIONS: None  WEIGHT BEARING RESTRICTIONS: No  FALLS:  Has patient fallen in last 6 months? No  LIVING ENVIRONMENT: Lives with: lives with their spouse Lives in: House/apartment Stairs: 4 STE B rails, split level home with 6 steps up/6 steps down U rail  Has following equipment at home: Single point cane, Walker - 2 wheeled, and toilet raiser   OCCUPATION: retired   PLOF: Independent, Independent with basic ADLs, Independent with gait, and Independent with transfers  PATIENT GOALS: be able to walk without pain  NEXT MD VISIT: Dr. Magnus Ivan May 30th   OBJECTIVE:   DIAGNOSTIC FINDINGS:   PATIENT SURVEYS:  FOTO 55, predicted 7  COGNITION: Overall cognitive status:  possible STM impairment, family not present to confirm    SENSATION: Not tested  EDEMA:   Some excessive pitting edema in surgical calf, no temperature or color changes, Homan's negative; on daily Xarelto per her report and per chart, she tells me she has been compliant with this medication   LOWER EXTREMITY ROM:  Active ROM Right eval Right 11/23/22 Right  11/30/22  Hip flexion     Hip extension     Hip abduction     Hip adduction     Hip internal rotation     Hip external rotation     Knee flexion 64* supine  Seated P: 81* Seated 64-71* PROM and AAROM  Knee extension 16* supine with heel prop  Supine With heel  prop P: -14* Seated with foot supported on stool 9* PROM  Ankle dorsiflexion     Ankle plantarflexion     Ankle inversion     Ankle eversion      (Blank rows = not tested)  LOWER EXTREMITY MMT:  MMT Right eval Left eval  Hip flexion 3+ 4+  Hip extension    Hip abduction    Hip adduction    Hip internal rotation    Hip external rotation    Knee flexion 4 4  Knee extension 2 4  Ankle dorsiflexion 5 5  Ankle plantarflexion    Ankle inversion    Ankle eversion     (Blank rows = not tested)    FUNCTIONAL TESTS:  5 times sit to stand: 43 seconds no UEs  Timed up and go (TUG): 19.5 seconds SPC  3 minute walk test: 355ft SPC   GAIT: Distance  walked: 332ft Assistive device utilized: Single point cane Level of assistance: Modified independence Comments: slow but steady, able to make sharp turns well at each end of walking course, used SPC on wrong side, foot flat and limited heel-toe pattern, limited R knee flexion/extension in stance/swing phases    TODAY'S TREATMENT:                                                                                                                              DATE:   12/04/22  TherEx  Scifit bike seat 10 x6 minutes, partial rotations  LAQs 3# x15 with 2 second hold HS curls red TB x15 Self knee flexion AAROM x15 with 5 second holds R LE Knee flexion holds 3x30 seconds edge of mat table Knee extension stretch x4 minutes 3#   Manual  Retrograde massage for edema management LE elevated    12/02/2022: Therapeutic Exercise: Seated LAQ & active knee flexion with LLE opposing motion 5 sec hold 10 reps 2 sets Standing TKE red theraband 10 reps 2 sets with PT tactile cues for quad activation Scifit bike seat 10 partial rotations for 1 min then full revolutions for ROM x 6 minutes Bridge 5 reps with BLEs even foot position Knee to chest stretch 15 sec hold 2 reps ea LE, PT AA with LLE  Manual Therapy PROM & contract relax for knee  flexion seated & extension supine   Therapeutic Activities Tandem stance 30 sec RLE in front with intermittent touch and in back with tactile/minA Pt amb 50' X 3 with cane with cues on technique and intermittent minA for balance.  Vaso LLE dual hose ankle & knee medium compression 34* 5 min only (pt request) with elevation  11/30/22  TherEx  Scifit bike seat 10 partial rotations for ROM x6 minutes Bridges with progressive knee flexion- attempted unable to perform due to back spasms  Knee extension stretch with foot on stool and 2# x4 minutes LAQS X2# R knee x15  Seated knee flexion stretch- knee bent to max tolerated ROM, hold for 30 seconds x3  Self AAROM for knee flexion x10    11/27/22  TherEx  Nustep x6 minutes for knee ROM SAQs 0# x15 focus on getting as much terminal knee extension as possible LAQs x12 0# 2 second holds  Self knee flexion stretch x10 with 3 second holds HS stretches seated edge of mat table on 6 inch box 3x30 seconds Knee extension stretch x3 minutes supine with heel prop and 2# on knee   Manual  Retrograde massage LEs elevated for edema control/lymph drainage    HOME EXERCISE PROGRAM: Access Code: [email or web address] URL: [email or web address] Date: 11/23/2022 Prepared by: Vladimir Faster  Exercises - Ankle Alphabet in Elevation  - 2-4 x daily - 7 x weekly - 1 sets - 1 reps - Supine Single Leg Ankle Pumps  - 1 x daily - 5 x weekly - 1 sets - 10 reps - 5 seconds hold -  Quad Setting and Stretching  - 2-4 x daily - 7 x weekly - 5-10 sets - 10 reps - 5 sec hold - Supine Heel Slide with Strap  - 2-3 x daily - 7 x weekly - 2-3 sets - 10 reps - 5 seconds hold - Supine Knee Extension Strengthening  - 1 x daily - 7 x weekly - 2-3 sets - 10 reps - 5 seconds hold - Supine Straight Leg Raises  - 2-3 x daily - 7 x weekly - 2-3 sets - 10 reps - 5 seconds hold - Seated Knee Flexion Extension AROM   - 2-4 x daily - 7 x weekly - 2-3 sets - 10 reps - 5  seconds hold - Seated straight leg lifts  - 2-3 x daily - 7 x weekly - 2-3 sets - 10 reps - 5 seconds hold - Seated Hamstring Stretch with Strap  - 2-4 x daily - 7 x weekly - 1 sets - 3 reps - 20-30 seconds hold - Seated Long Arc Quad  - 1 x daily - 7 x weekly - 2-3 sets - 10 reps - 5 seconds hold - Heel raises near counter  - 1 x daily - 5 x weekly - 1 sets - 10 reps - 5 seconds hold - standing calf stretch with forefoot on small step or brick  - 1 x daily - 7 x weekly - 1 sets - 3 reps - 20-30 seconds hold - Standing Knee Flexion with Counter Support  - 1 x daily - 7 x weekly - 12-3 sets - 10 reps - 5 seconds hold - Standing March with Counter Support  - 1 x daily - 7 x weekly - 2-3 sets - 10 reps - 5 seconds hold - standing knee extension  - 1 x daily - 7 x weekly - 2-3 sets - 10 reps - 5 seconds hold   ASSESSMENT: CLINICAL IMPRESSION:  Barry arrives today doing OK, back is doing a little better. Continued working on ROM and strength as tolerated given knee and back pain. Making slow but steady progress, back pain continues to be a complicating factor. Will continue efforts.   OBJECTIVE IMPAIRMENTS: Abnormal gait, decreased activity tolerance, decreased balance, decreased coordination, decreased mobility, difficulty walking, decreased ROM, decreased strength, hypomobility, increased edema, increased fascial restrictions, impaired flexibility, impaired UE functional use, and pain.   ACTIVITY LIMITATIONS: sitting, standing, squatting, stairs, transfers, bed mobility, and locomotion level  PARTICIPATION LIMITATIONS: cleaning, laundry, driving, shopping, community activity, occupation, and yard work  PERSONAL FACTORS: Age, Behavior pattern, Education, Fitness, Past/current experiences, Social background, and Time since onset of injury/illness/exacerbation are also affecting patient's functional outcome.   REHAB POTENTIAL: Good  CLINICAL DECISION MAKING: Stable/uncomplicated  EVALUATION  COMPLEXITY: Low   GOALS: Goals reviewed with patient? Yes  SHORT TERM GOALS: Target date: 12/18/2022   Will be compliant with appropriate progressive HEP  Baseline: Goal status: ongoing 11/23/2022  2.  R knee AROM to be no more than 5 degrees extension and no less than 90 degrees flexion  Baseline:  Goal status: ongoing 11/23/2022  3.  Will be independent with management techniques for localized edema including wearing compression stockings and appropriate ice routine  Baseline:  Goal status: ongoing 11/23/2022  4.  Gait pattern to have normalized and she will demonstrate steady gait with use of SPC on appropriate side/good technique with SPC  Baseline:  Goal status: ongoing 11/23/2022   LONG TERM GOALS: Target date: 01/15/2023    MMT to  have improved by 1 grade in all weak muscles  Baseline:  Goal status: ongoing 11/25/2022  2.  R knee flexion AROM to be at least 105 degrees  Baseline:  Goal status: ongoing 11/25/2022  3.  Will be able to ascend/descend steps with U rail and reciprocal pattern, no increase in pain  Baseline:  Goal status: ongoing 11/25/2022  4.  Will complete 5x sit to stand test in 20 seconds or less no UEs  Baseline:  Goal status: ongoing 11/25/2022  5.  Will complete TUG test in 13 seconds or less with LRAD to show improved functional balance Baseline:  Goal status: ongoing 11/25/2022  6.  Will ambulate at least 542ft with LRAD in to show improved mobility and community access  Baseline:  Goal status: ongoing 11/25/2022   PLAN:  PT FREQUENCY:  3x/week for 4 weeks, then drop to 2x/week for 4 weeks   PT DURATION: 8 weeks  PLANNED INTERVENTIONS: Therapeutic exercises, Therapeutic activity, Neuromuscular re-education, Balance training, Gait training, Patient/Family education, Self Care, Joint mobilization, Stair training, Orthotic/Fit training, DME instructions, Aquatic Therapy, Dry Needling, Electrical stimulation, Cryotherapy, Moist heat, Taping,  Vasopneumatic device, Ultrasound, Ionotophoresis 4mg /ml Dexamethasone, Manual therapy, and Re-evaluation  PLAN FOR NEXT SESSION:  continue manual therapy & exercise for range, strength as appropriate, vaso to end for edema consider dual hose if edema in ankle area also.   Nedra Hai PT DPT PN2

## 2022-12-07 ENCOUNTER — Ambulatory Visit (INDEPENDENT_AMBULATORY_CARE_PROVIDER_SITE_OTHER): Payer: Medicare Other | Admitting: Physical Therapy

## 2022-12-07 ENCOUNTER — Encounter: Payer: Self-pay | Admitting: Physical Therapy

## 2022-12-07 DIAGNOSIS — M25661 Stiffness of right knee, not elsewhere classified: Secondary | ICD-10-CM

## 2022-12-07 DIAGNOSIS — M25561 Pain in right knee: Secondary | ICD-10-CM | POA: Diagnosis not present

## 2022-12-07 DIAGNOSIS — R600 Localized edema: Secondary | ICD-10-CM

## 2022-12-07 DIAGNOSIS — R262 Difficulty in walking, not elsewhere classified: Secondary | ICD-10-CM

## 2022-12-07 DIAGNOSIS — M6281 Muscle weakness (generalized): Secondary | ICD-10-CM | POA: Diagnosis not present

## 2022-12-07 NOTE — Therapy (Signed)
OUTPATIENT PHYSICAL THERAPY LOWER EXTREMITY TREATMENT   Patient Name: Melissa Maldonado MRN: [id number] DOB:1940/09/30, 82 y.o., female Today's Date: 12/07/2022  END OF SESSION:  PT End of Session - 12/07/22 1345     Visit Number 8    Number of Visits 21    Date for PT Re-Evaluation 01/15/23    Authorization Type MCR and BCBS State    Authorization Time Period 11/20/22 to 01/15/23    Progress Note Due on Visit 10    PT Start Time 1345    PT Stop Time 1431    PT Time Calculation (min) 46 min    Activity Tolerance Patient limited by pain    Behavior During Therapy [facility] for tasks assessed/performed                  Past Medical History:  Diagnosis Date   Arthritis    Chronic back pain    lumbar stenosis   Dysrhythmia    Early cataracts, bilateral    GERD (gastroesophageal reflux disease)    occasionally but no meds required   History of blood transfusion    History of colon polyps    Hyperlipidemia    takes Lipitor every other day   Hypertension    takes losartan daily   Neuropathy    Osteoarthritis of left knee 04/27/2014   Restless leg    Shortness of breath    with exertion   SOB (shortness of breath) 2013   CPET   Tear of medial meniscus of left knee 09/08/2013   Past Surgical History:  Procedure Laterality Date   APPENDECTOMY     BACK SURGERY  07/20/2008   lumb fusion   COLONOSCOPY  2018   EYE SURGERY     both cataracts   KNEE ARTHROSCOPY WITH MEDIAL MENISECTOMY Left 09/08/2013   Procedure: LEFT KNEE ARTHROSCOPY WITH PARTIAL MEDIAL MENISCECTOMY;  Surgeon: Eulas Post, MD;  Location: [facility];  Service: Orthopedics;  Laterality: Left;   LUMBAR LAMINECTOMY/DECOMPRESSION MICRODISCECTOMY  05/13/2012   Procedure: LUMBAR LAMINECTOMY/DECOMPRESSION MICRODISCECTOMY 1 LEVEL;  Surgeon: Barnett Abu, MD;  Location: [facility];  Service: Neurosurgery;  Laterality: Bilateral;  Bilateral Lumbar one -two Decompressive Laminectomy   PARTIAL  KNEE ARTHROPLASTY Left 04/27/2014   Procedure: LEFT UNICOMPARTMENTAL KNEE;  Surgeon: Eulas Post, MD;  Location:  SUR[facility]ERY CENTER;  Service: Orthopedics;  Laterality: Left;   TOTAL KNEE ARTHROPLASTY Right 11/06/2022   Procedure: RIGHT TOTAL KNEE ARTHROPLASTY;  Surgeon: Kathryne Hitch, MD;  Location: [facility];  Service: Orthopedics;  Laterality: Right;   TUBAL LIGATION  1974   UPPER GI ENDOSCOPY     Patient Active Problem List   Diagnosis Date Noted   Status post total right knee replacement 11/06/2022   Unilateral primary osteoarthritis, right knee 09/07/2022   Arthritis of right acromioclavicular joint 08/28/2021   Impingement syndrome of right shoulder 08/19/2021   Nontraumatic incomplete tear of right rotator cuff 08/19/2021   Paresthesia 12/05/2020   Low back pain with right-sided sciatica 12/05/2020   Trochanteric bursitis, right hip 04/24/2020   Essential hypertension 01/12/2020   Sinus bradycardia 01/12/2020   Dyspnea on exertion 01/12/2020   Lumbar stenosis with neurogenic claudication 02/01/2017   Osteoarthritis of left knee 04/27/2014    PCP: Merri Brunette MD   REFERRING PROVIDER: Kathryne Hitch, MD  REFERRING DIAG: [id number] (ICD-10-CM) - Status post total right knee replacement M17.11 (ICD-10-CM) - Unilateral primary osteoarthritis, right knee  THERAPY DIAG:  Stiffness  of right knee, not elsewhere classified  Acute pain of right knee  Localized edema  Muscle weakness (generalized)  Difficulty in walking, not elsewhere classified  Rationale for Evaluation and Treatment: Rehabilitation  ONSET DATE: 11/09/2022  SUBJECTIVE:   SUBJECTIVE STATEMENT: She continues to do her exercises. She slept pretty good last night. She has to get up to toilet but can go back to sleep.    PERTINENT HISTORY: Hospital Course: Melissa Maldonado is an 82 y.o. female who was admitted 11/06/2022 for operative treatment ofUnilateral primary osteoarthritis,  right knee. Patient has severe unremitting pain that affects sleep, daily activities, and work/hobbies. After pre-op clearance the patient was taken to the operating room on 11/06/2022 and underwent  Procedure(s): RIGHT TOTAL KNEE ARTHROPLASTY.   PMH: OA, right rotator cuff tear, LBP w/ sciatica, lumbar stenosis, back surgery lumbar fusion & discectomy,  right hip bursitis, HEN, bradycardia,    PAIN:  Are you having pain?  Yes: NPRS scale:  0/10 today and up to 7/10 since last PT session Pain location: R knee  Pain description: soreness Aggravating factors: doing too much  Relieving factors: ice, medicines    PRECAUTIONS: None  WEIGHT BEARING RESTRICTIONS: No  FALLS:  Has patient fallen in last 6 months? No  LIVING ENVIRONMENT: Lives with: lives with their spouse Lives in: House/apartment Stairs: 4 STE B rails, split level home with 6 steps up/6 steps down U rail  Has following equipment at home: Single point cane, Walker - 2 wheeled, and toilet raiser   OCCUPATION: retired   PLOF: Independent, Independent with basic ADLs, Independent with gait, and Independent with transfers  PATIENT GOALS: be able to walk without pain  NEXT MD VISIT: Dr. Magnus Ivan May 30th   OBJECTIVE:   DIAGNOSTIC FINDINGS:   PATIENT SURVEYS:  FOTO 55, predicted 83  COGNITION: Overall cognitive status:  possible STM impairment, family not present to confirm    SENSATION: Not tested  EDEMA:   Some excessive pitting edema in surgical calf, no temperature or color changes, Homan's negative; on daily Xarelto per her report and per chart, she tells me she has been compliant with this medication   LOWER EXTREMITY ROM:  Active ROM Right eval Right 11/23/22 Right  11/30/22 Right 12/07/22  Hip flexion      Hip extension      Hip abduction      Hip adduction      Hip internal rotation      Hip external rotation      Knee flexion 64* supine  Seated P: 81* Seated 64-71* PROM and AAROM Seated A:  71* P: 82*  Knee extension 16* supine with heel prop  Supine With heel prop P: -14* Seated with foot supported on stool 9* PROM Supine  A: Quad set -7*  Ankle dorsiflexion      Ankle plantarflexion      Ankle inversion      Ankle eversion       (Blank rows = not tested)  LOWER EXTREMITY MMT:  MMT Right eval Left eval  Hip flexion 3+ 4+  Hip extension    Hip abduction    Hip adduction    Hip internal rotation    Hip external rotation    Knee flexion 4 4  Knee extension 2 4  Ankle dorsiflexion 5 5  Ankle plantarflexion    Ankle inversion    Ankle eversion     (Blank rows = not tested)    FUNCTIONAL TESTS:  5 times sit to stand: 43 seconds no UEs  Timed up and go (TUG): 19.5 seconds SPC  3 minute walk test: 385ft SPC   GAIT: Distance walked: 381ft Assistive device utilized: Single point cane Level of assistance: Modified independence Comments: slow but steady, able to make sharp turns well at each end of walking course, used SPC on wrong side, foot flat and limited heel-toe pattern, limited R knee flexion/extension in stance/swing phases    TODAY'S TREATMENT:                                                                                                                              DATE:  12/07/2022: Therapeutic Exercise Scifit bike seat 10 x  6 minutes, full rotations level 1 Leg press BLEs 81# 10 reps 2 sets; RLE only 31# 10 reps Hamstring stretch passive 30 sec hold 2 reps Tandem stance on foam beam RLE in front & in back 30 sec with single touch ea  Gastroc stretch step heel depression 30 sec hold 2 reps  Heel raises BLEs 10 reps with light UE support SAQs 5# 10 reps 2 sets with 2 second hold Seated LAQ & active knee flexion with contralateral LE opposing motion 10 reps 2 sets. LAQ without LLE knee flexion -14* and with LLE knee flexion -9*  Gait Training: Pt amb 50-75' X 3 within clinic with cane with supervision Pt appears safe to initiate cane use in  home where distances are short & no barriers. Pt verbalized understanding  Manual PROM with overpressure & Hypervolt to quad for knee flexion seated PROM with overpressure & quad set for knee ext supine Scar mobs  LLE knee medium compression 34* 5 min only (pt request) with elevation   12/04/22  TherEx  Scifit bike seat 10 x6 minutes, partial rotations  LAQs 3# x15 with 2 second hold HS curls red TB x15 Self knee flexion AAROM x15 with 5 second holds R LE Knee flexion holds 3x30 seconds edge of mat table Knee extension stretch x4 minutes 3#   Manual  Retrograde massage for edema management LE elevated    12/02/2022: Therapeutic Exercise: Seated LAQ & active knee flexion with LLE opposing motion 5 sec hold 10 reps 2 sets Standing TKE red theraband 10 reps 2 sets with PT tactile cues for quad activation Scifit bike seat 10 partial rotations for 1 min then full revolutions for ROM x 6 minutes Bridge 5 reps with BLEs even foot position Knee to chest stretch 15 sec hold 2 reps ea LE, PT AA with LLE  Manual Therapy PROM & contract relax for knee flexion seated & extension supine   Therapeutic Activities Tandem stance 30 sec RLE in front with intermittent touch and in back with tactile/minA Pt amb 50' X 3 with cane with cues on technique and intermittent minA for balance.  Vaso LLE dual hose ankle & knee medium compression 34* 5 min only (pt request) with elevation  HOME EXERCISE PROGRAM: Access Code: [email or web address] URL: [email or web address] Date: 11/23/2022 Prepared by: Vladimir Faster  Exercises - Ankle Alphabet in Elevation  - 2-4 x daily - 7 x weekly - 1 sets - 1 reps - Supine Single Leg Ankle Pumps  - 1 x daily - 5 x weekly - 1 sets - 10 reps - 5 seconds hold - Quad Setting and Stretching  - 2-4 x daily - 7 x weekly - 5-10 sets - 10 reps - 5 sec hold - Supine Heel Slide with Strap  - 2-3 x daily - 7 x weekly - 2-3 sets - 10 reps - 5 seconds hold - Supine  Knee Extension Strengthening  - 1 x daily - 7 x weekly - 2-3 sets - 10 reps - 5 seconds hold - Supine Straight Leg Raises  - 2-3 x daily - 7 x weekly - 2-3 sets - 10 reps - 5 seconds hold - Seated Knee Flexion Extension AROM   - 2-4 x daily - 7 x weekly - 2-3 sets - 10 reps - 5 seconds hold - Seated straight leg lifts  - 2-3 x daily - 7 x weekly - 2-3 sets - 10 reps - 5 seconds hold - Seated Hamstring Stretch with Strap  - 2-4 x daily - 7 x weekly - 1 sets - 3 reps - 20-30 seconds hold - Seated Long Arc Quad  - 1 x daily - 7 x weekly - 2-3 sets - 10 reps - 5 seconds hold - Heel raises near counter  - 1 x daily - 5 x weekly - 1 sets - 10 reps - 5 seconds hold - standing calf stretch with forefoot on small step or brick  - 1 x daily - 7 x weekly - 1 sets - 3 reps - 20-30 seconds hold - Standing Knee Flexion with Counter Support  - 1 x daily - 7 x weekly - 12-3 sets - 10 reps - 5 seconds hold - Standing March with Counter Support  - 1 x daily - 7 x weekly - 2-3 sets - 10 reps - 5 seconds hold - standing knee extension  - 1 x daily - 7 x weekly - 2-3 sets - 10 reps - 5 seconds hold   ASSESSMENT: CLINICAL IMPRESSION: End feel continues to be pain. Using Hypervolt to quad improved knee PROM.  She appears safe to use cane for household activities.    OBJECTIVE IMPAIRMENTS: Abnormal gait, decreased activity tolerance, decreased balance, decreased coordination, decreased mobility, difficulty walking, decreased ROM, decreased strength, hypomobility, increased edema, increased fascial restrictions, impaired flexibility, impaired UE functional use, and pain.   ACTIVITY LIMITATIONS: sitting, standing, squatting, stairs, transfers, bed mobility, and locomotion level  PARTICIPATION LIMITATIONS: cleaning, laundry, driving, shopping, community activity, occupation, and yard work  PERSONAL FACTORS: Age, Behavior pattern, Education, Fitness, Past/current experiences, Social background, and Time since onset of  injury/illness/exacerbation are also affecting patient's functional outcome.   REHAB POTENTIAL: Good  CLINICAL DECISION MAKING: Stable/uncomplicated  EVALUATION COMPLEXITY: Low   GOALS: Goals reviewed with patient? Yes  SHORT TERM GOALS: Target date: 12/18/2022   Will be compliant with appropriate progressive HEP  Baseline: Goal status: ongoing 11/23/2022  2.  R knee AROM to be no more than 5 degrees extension and no less than 90 degrees flexion  Baseline:  Goal status: ongoing 11/23/2022  3.  Will be independent with management techniques for localized edema including wearing compression stockings and appropriate ice routine  Baseline:  Goal status: ongoing  11/23/2022  4.  Gait pattern to have normalized and she will demonstrate steady gait with use of SPC on appropriate side/good technique with SPC  Baseline:  Goal status: ongoing 11/23/2022   LONG TERM GOALS: Target date: 01/15/2023    MMT to have improved by 1 grade in all weak muscles  Baseline:  Goal status: ongoing 11/25/2022  2.  R knee flexion AROM to be at least 105 degrees  Baseline:  Goal status: ongoing 11/25/2022  3.  Will be able to ascend/descend steps with U rail and reciprocal pattern, no increase in pain  Baseline:  Goal status: ongoing 11/25/2022  4.  Will complete 5x sit to stand test in 20 seconds or less no UEs  Baseline:  Goal status: ongoing 11/25/2022  5.  Will complete TUG test in 13 seconds or less with LRAD to show improved functional balance Baseline:  Goal status: ongoing 11/25/2022  6.  Will ambulate at least 525ft with LRAD in to show improved mobility and community access  Baseline:  Goal status: ongoing 11/25/2022   PLAN:  PT FREQUENCY:  3x/week for 4 weeks, then drop to 2x/week for 4 weeks   PT DURATION: 8 weeks  PLANNED INTERVENTIONS: Therapeutic exercises, Therapeutic activity, Neuromuscular re-education, Balance training, Gait training, Patient/Family education, Self Care, Joint  mobilization, Stair training, Orthotic/Fit training, DME instructions, Aquatic Therapy, Dry Needling, Electrical stimulation, Cryotherapy, Moist heat, Taping, Vasopneumatic device, Ultrasound, Ionotophoresis 4mg /ml Dexamethasone, Manual therapy, and Re-evaluation  PLAN FOR NEXT SESSION:  manual therapy & exercise for range, strength as appropriate, vaso to end for edema consider dual hose if edema in ankle area also.    Vladimir Faster, PT, DPT 12/07/2022, 2:46 PM

## 2022-12-09 ENCOUNTER — Ambulatory Visit (INDEPENDENT_AMBULATORY_CARE_PROVIDER_SITE_OTHER): Payer: Medicare Other | Admitting: Physical Therapy

## 2022-12-09 ENCOUNTER — Encounter: Payer: Self-pay | Admitting: Physical Therapy

## 2022-12-09 DIAGNOSIS — M25561 Pain in right knee: Secondary | ICD-10-CM

## 2022-12-09 DIAGNOSIS — M6281 Muscle weakness (generalized): Secondary | ICD-10-CM

## 2022-12-09 DIAGNOSIS — R600 Localized edema: Secondary | ICD-10-CM

## 2022-12-09 DIAGNOSIS — M25661 Stiffness of right knee, not elsewhere classified: Secondary | ICD-10-CM

## 2022-12-09 DIAGNOSIS — R262 Difficulty in walking, not elsewhere classified: Secondary | ICD-10-CM

## 2022-12-09 NOTE — Therapy (Signed)
OUTPATIENT PHYSICAL THERAPY LOWER EXTREMITY TREATMENT   Patient Name: Melissa Maldonado MRN: [id number] DOB:[birth year], 82 y.o., female Today's Date: 12/09/2022  END OF SESSION:  PT End of Session - 12/09/22 1432     Visit Number 9    Number of Visits 21    Date for PT Re-Evaluation 01/15/23    Authorization Type MCR and BCBS State    Authorization Time Period 11/20/22 to 01/15/23    Progress Note Due on Visit 10    PT Start Time 1430    PT Stop Time 1522    PT Time Calculation (min) 52 min    Activity Tolerance Patient limited by pain    Behavior During Therapy [facility] for tasks assessed/performed                  Past Medical History:  Diagnosis Date   Arthritis    Chronic back pain    lumbar stenosis   Dysrhythmia    Early cataracts, bilateral    GERD (gastroesophageal reflux disease)    occasionally but no meds required   History of blood transfusion    History of colon polyps    Hyperlipidemia    takes Lipitor every other day   Hypertension    takes losartan daily   Neuropathy    Osteoarthritis of left knee 04/27/2014   Restless leg    Shortness of breath    with exertion   SOB (shortness of breath) 2013   CPET   Tear of medial meniscus of left knee 09/08/2013   Past Surgical History:  Procedure Laterality Date   APPENDECTOMY     BACK SURGERY  07/20/2008   lumb fusion   COLONOSCOPY  2018   EYE SURGERY     both cataracts   KNEE ARTHROSCOPY WITH MEDIAL MENISECTOMY Left 09/08/2013   Procedure: LEFT KNEE ARTHROSCOPY WITH PARTIAL MEDIAL MENISCECTOMY;  Surgeon: Eulas Post, MD;  Location: [facility];  Service: Orthopedics;  Laterality: Left;   LUMBAR LAMINECTOMY/DECOMPRESSION MICRODISCECTOMY  05/13/2012   Procedure: LUMBAR LAMINECTOMY/DECOMPRESSION MICRODISCECTOMY 1 LEVEL;  Surgeon: Barnett Abu, MD;  Location: [facility];  Service: Neurosurgery;  Laterality: Bilateral;  Bilateral Lumbar one -two Decompressive Laminectomy   PARTIAL  KNEE ARTHROPLASTY Left 04/27/2014   Procedure: LEFT UNICOMPARTMENTAL KNEE;  Surgeon: Eulas Post, MD;  Location: [facility];  Service: Orthopedics;  Laterality: Left;   TOTAL KNEE ARTHROPLASTY Right 11/06/2022   Procedure: RIGHT TOTAL KNEE ARTHROPLASTY;  Surgeon: Kathryne Hitch, MD;  Location: [facility];  Service: Orthopedics;  Laterality: Right;   TUBAL LIGATION  1974   UPPER GI ENDOSCOPY     Patient Active Problem List   Diagnosis Date Noted   Status post total right knee replacement 11/06/2022   Unilateral primary osteoarthritis, right knee 09/07/2022   Arthritis of right acromioclavicular joint 08/28/2021   Impingement syndrome of right shoulder 08/19/2021   Nontraumatic incomplete tear of right rotator cuff 08/19/2021   Paresthesia 12/05/2020   Low back pain with right-sided sciatica 12/05/2020   Trochanteric bursitis, right hip 04/24/2020   Essential hypertension 01/12/2020   Sinus bradycardia 01/12/2020   Dyspnea on exertion 01/12/2020   Lumbar stenosis with neurogenic claudication 02/01/2017   Osteoarthritis of left knee 04/27/2014    PCP: Merri Brunette MD   REFERRING PROVIDER: Kathryne Hitch, MD  REFERRING DIAG: [phone] (ICD-10-CM) - Status post total right knee replacement M17.11 (ICD-10-CM) - Unilateral primary osteoarthritis, right knee  THERAPY DIAG:  Stiffness  of right knee, not elsewhere classified  Acute pain of right knee  Muscle weakness (generalized)  Localized edema  Difficulty in walking, not elsewhere classified  Rationale for Evaluation and Treatment: Rehabilitation  ONSET DATE: 11/09/2022  SUBJECTIVE:   SUBJECTIVE STATEMENT: She has been doing her exercises.    Sleeping is issue as knee too sore to lay on her side and on her back makes her back hurt worse.   PERTINENT HISTORY: Hospital Course: Melissa Maldonado is an 82 y.o. female who was admitted 11/06/2022 for operative treatment ofUnilateral primary  osteoarthritis, right knee. Patient has severe unremitting pain that affects sleep, daily activities, and work/hobbies. After pre-op clearance the patient was taken to the operating room on 11/06/2022 and underwent  Procedure(s): RIGHT TOTAL KNEE ARTHROPLASTY.   PMH: OA, right rotator cuff tear, LBP w/ sciatica, lumbar stenosis, back surgery lumbar fusion & discectomy,  right hip bursitis, HEN, bradycardia,    PAIN:  Are you having pain?  Yes: NPRS scale:  1/10 today and up to 8/10 since last PT session Pain location: R knee  Pain description: soreness Aggravating factors: doing too much  Relieving factors: ice, medicines    PRECAUTIONS: None  WEIGHT BEARING RESTRICTIONS: No  FALLS:  Has patient fallen in last 6 months? No  LIVING ENVIRONMENT: Lives with: lives with their spouse Lives in: House/apartment Stairs: 4 STE B rails, split level home with 6 steps up/6 steps down U rail  Has following equipment at home: Single point cane, Walker - 2 wheeled, and toilet raiser   OCCUPATION: retired   PLOF: Independent, Independent with basic ADLs, Independent with gait, and Independent with transfers  PATIENT GOALS: be able to walk without pain  NEXT MD VISIT: Dr. Magnus Ivan May 30th   OBJECTIVE:   DIAGNOSTIC FINDINGS:   PATIENT SURVEYS:  FOTO 55, predicted 69  COGNITION: Overall cognitive status:  possible STM impairment, family not present to confirm    SENSATION: Not tested  EDEMA:   Some excessive pitting edema in surgical calf, no temperature or color changes, Homan's negative; on daily Xarelto per her report and per chart, she tells me she has been compliant with this medication   LOWER EXTREMITY ROM:  Active ROM Right eval Right 11/23/22 Right  11/30/22 Right 12/07/22  Hip flexion      Hip extension      Hip abduction      Hip adduction      Hip internal rotation      Hip external rotation      Knee flexion 64* supine  Seated P: 81* Seated 64-71* PROM and AAROM  Seated A: 71* P: 82*  Knee extension 16* supine with heel prop  Supine With heel prop P: -14* Seated with foot supported on stool 9* PROM Supine  A: Quad set -7*  Ankle dorsiflexion      Ankle plantarflexion      Ankle inversion      Ankle eversion       (Blank rows = not tested)  LOWER EXTREMITY MMT:  MMT Right eval Left eval  Hip flexion 3+ 4+  Hip extension    Hip abduction    Hip adduction    Hip internal rotation    Hip external rotation    Knee flexion 4 4  Knee extension 2 4  Ankle dorsiflexion 5 5  Ankle plantarflexion    Ankle inversion    Ankle eversion     (Blank rows = not tested)  FUNCTIONAL TESTS:  5 times sit to stand: 43 seconds no UEs  Timed up and go (TUG): 19.5 seconds SPC  3 minute walk test: 384ft SPC   GAIT: Distance walked: 324ft Assistive device utilized: Single point cane Level of assistance: Modified independence Comments: slow but steady, able to make sharp turns well at each end of walking course, used SPC on wrong side, foot flat and limited heel-toe pattern, limited R knee flexion/extension in stance/swing phases    TODAY'S TREATMENT:                                                                                                                              DATE:  12/09/2022: Therapeutic Exercise Scifit bike seat 10 x  5 minutes with BLEs & BUEs then 3 min with BLEs only, full rotations level 1 Leg press BLEs 81# 10 reps 2 sets; RLE only 37# 10 reps Hamstring stretch passive 30 sec hold 2 reps Gastroc stretch step heel depression 30 sec hold 2 reps  Heel raises BLEs 10 reps with right UE support Step up RLE and step down LLE (RLE eccentric) 6" step BUE support //bars 5 reps. PT demo & verbal cues on technique.  Slider to 3 cones (ant-lat, lat & post-lat) with stance LE knee flexion on outward motion and extension on inward motion. 3 reps ea LE.   Functional squat to pick up items from floor 5 reps with supervision.   Gait  Training: Pt amb 50-75' X 3 within clinic with cane with supervision  Manual PROM with overpressure & Hypervolt to quad for knee flexion seated PROM with overpressure & quad set for knee ext supine  LLE dual hose for ankle & knee medium compression 34* 5 min only (pt request) with elevation  12/07/2022: Therapeutic Exercise Scifit bike seat 10 x  6 minutes, full rotations level 1 Leg press BLEs 81# 10 reps 2 sets; RLE only 31# 10 reps Hamstring stretch passive 30 sec hold 2 reps Tandem stance on foam beam RLE in front & in back 30 sec with single touch ea  Gastroc stretch step heel depression 30 sec hold 2 reps  Heel raises BLEs 10 reps with light UE support SAQs 5# 10 reps 2 sets with 2 second hold Seated LAQ & active knee flexion with contralateral LE opposing motion 10 reps 2 sets. LAQ without LLE knee flexion -14* and with LLE knee flexion -9*  Gait Training: Pt amb 50-75' X 3 within clinic with cane with supervision Pt appears safe to initiate cane use in home where distances are short & no barriers. Pt verbalized understanding  Manual PROM with overpressure & Hypervolt to quad for knee flexion seated PROM with overpressure & quad set for knee ext supine Scar mobs  LLE knee medium compression 34* 5 min only (pt request) with elevation   12/04/22  TherEx  Scifit bike seat 10 x6 minutes, partial rotations  LAQs 3# x15 with 2  second hold HS curls red TB x15 Self knee flexion AAROM x15 with 5 second holds R LE Knee flexion holds 3x30 seconds edge of mat table Knee extension stretch x4 minutes 3#   Manual  Retrograde massage for edema management LE elevated     HOME EXERCISE PROGRAM: Access Code: [email or web address] URL: [email or web address] Date: 11/23/2022 Prepared by: Vladimir Faster  Exercises - Ankle Alphabet in Elevation  - 2-4 x daily - 7 x weekly - 1 sets - 1 reps - Supine Single Leg Ankle Pumps  - 1 x daily - 5 x weekly - 1 sets - 10 reps - 5 seconds  hold - Quad Setting and Stretching  - 2-4 x daily - 7 x weekly - 5-10 sets - 10 reps - 5 sec hold - Supine Heel Slide with Strap  - 2-3 x daily - 7 x weekly - 2-3 sets - 10 reps - 5 seconds hold - Supine Knee Extension Strengthening  - 1 x daily - 7 x weekly - 2-3 sets - 10 reps - 5 seconds hold - Supine Straight Leg Raises  - 2-3 x daily - 7 x weekly - 2-3 sets - 10 reps - 5 seconds hold - Seated Knee Flexion Extension AROM   - 2-4 x daily - 7 x weekly - 2-3 sets - 10 reps - 5 seconds hold - Seated straight leg lifts  - 2-3 x daily - 7 x weekly - 2-3 sets - 10 reps - 5 seconds hold - Seated Hamstring Stretch with Strap  - 2-4 x daily - 7 x weekly - 1 sets - 3 reps - 20-30 seconds hold - Seated Long Arc Quad  - 1 x daily - 7 x weekly - 2-3 sets - 10 reps - 5 seconds hold - Heel raises near counter  - 1 x daily - 5 x weekly - 1 sets - 10 reps - 5 seconds hold - standing calf stretch with forefoot on small step or brick  - 1 x daily - 7 x weekly - 1 sets - 3 reps - 20-30 seconds hold - Standing Knee Flexion with Counter Support  - 1 x daily - 7 x weekly - 12-3 sets - 10 reps - 5 seconds hold - Standing March with Counter Support  - 1 x daily - 7 x weekly - 2-3 sets - 10 reps - 5 seconds hold - standing knee extension  - 1 x daily - 7 x weekly - 2-3 sets - 10 reps - 5 seconds hold   ASSESSMENT: CLINICAL IMPRESSION: Patient continues to be limited by knee & back pain.  She tolerates exercises including progressive functional ones added today working through her pain.  She reports knee feels better after PT.    OBJECTIVE IMPAIRMENTS: Abnormal gait, decreased activity tolerance, decreased balance, decreased coordination, decreased mobility, difficulty walking, decreased ROM, decreased strength, hypomobility, increased edema, increased fascial restrictions, impaired flexibility, impaired UE functional use, and pain.   ACTIVITY LIMITATIONS: sitting, standing, squatting, stairs, transfers, bed mobility,  and locomotion level  PARTICIPATION LIMITATIONS: cleaning, laundry, driving, shopping, community activity, occupation, and yard work  PERSONAL FACTORS: Age, Behavior pattern, Education, Fitness, Past/current experiences, Social background, and Time since onset of injury/illness/exacerbation are also affecting patient's functional outcome.   REHAB POTENTIAL: Good  CLINICAL DECISION MAKING: Stable/uncomplicated  EVALUATION COMPLEXITY: Low   GOALS: Goals reviewed with patient? Yes  SHORT TERM GOALS: Target date: 12/18/2022   Will be compliant with appropriate progressive HEP  Baseline: Goal  status: ongoing 11/23/2022  2.  R knee AROM to be no more than 5 degrees extension and no less than 90 degrees flexion  Baseline:  Goal status: ongoing 11/23/2022  3.  Will be independent with management techniques for localized edema including wearing compression stockings and appropriate ice routine  Baseline:  Goal status: ongoing 11/23/2022  4.  Gait pattern to have normalized and she will demonstrate steady gait with use of SPC on appropriate side/good technique with SPC  Baseline:  Goal status: ongoing 11/23/2022   LONG TERM GOALS: Target date: 01/15/2023    MMT to have improved by 1 grade in all weak muscles  Baseline:  Goal status: ongoing 11/25/2022  2.  R knee flexion AROM to be at least 105 degrees  Baseline:  Goal status: ongoing 11/25/2022  3.  Will be able to ascend/descend steps with U rail and reciprocal pattern, no increase in pain  Baseline:  Goal status: ongoing 11/25/2022  4.  Will complete 5x sit to stand test in 20 seconds or less no UEs  Baseline:  Goal status: ongoing 11/25/2022  5.  Will complete TUG test in 13 seconds or less with LRAD to show improved functional balance Baseline:  Goal status: ongoing 11/25/2022  6.  Will ambulate at least 566ft with LRAD in to show improved mobility and community access  Baseline:  Goal status: ongoing 11/25/2022   PLAN:  PT  FREQUENCY:  3x/week for 4 weeks, then drop to 2x/week for 4 weeks   PT DURATION: 8 weeks  PLANNED INTERVENTIONS: Therapeutic exercises, Therapeutic activity, Neuromuscular re-education, Balance training, Gait training, Patient/Family education, Self Care, Joint mobilization, Stair training, Orthotic/Fit training, DME instructions, Aquatic Therapy, Dry Needling, Electrical stimulation, Cryotherapy, Moist heat, Taping, Vasopneumatic device, Ultrasound, Ionotophoresis 4mg /ml Dexamethasone, Manual therapy, and Re-evaluation  PLAN FOR NEXT SESSION:  Do 10th visit progress note and interim FOTO,   manual therapy & exercise for range, strength as appropriate, vaso to end for edema consider dual hose if edema in ankle area also.    Vladimir Faster, PT, DPT 12/09/2022, 4:07 PM

## 2022-12-11 ENCOUNTER — Encounter: Payer: Medicare Other | Admitting: Physical Therapy

## 2022-12-15 ENCOUNTER — Ambulatory Visit (INDEPENDENT_AMBULATORY_CARE_PROVIDER_SITE_OTHER): Payer: Medicare Other | Admitting: Physical Therapy

## 2022-12-15 ENCOUNTER — Other Ambulatory Visit: Payer: Self-pay | Admitting: Orthopaedic Surgery

## 2022-12-15 ENCOUNTER — Encounter: Payer: Self-pay | Admitting: Physical Therapy

## 2022-12-15 ENCOUNTER — Telehealth: Payer: Self-pay | Admitting: Orthopaedic Surgery

## 2022-12-15 DIAGNOSIS — M25661 Stiffness of right knee, not elsewhere classified: Secondary | ICD-10-CM

## 2022-12-15 DIAGNOSIS — R262 Difficulty in walking, not elsewhere classified: Secondary | ICD-10-CM | POA: Diagnosis not present

## 2022-12-15 DIAGNOSIS — M25561 Pain in right knee: Secondary | ICD-10-CM | POA: Diagnosis not present

## 2022-12-15 DIAGNOSIS — R600 Localized edema: Secondary | ICD-10-CM

## 2022-12-15 DIAGNOSIS — M6281 Muscle weakness (generalized): Secondary | ICD-10-CM

## 2022-12-15 MED ORDER — OXYCODONE HCL 5 MG PO TABS: 5.0000 mg | ORAL_TABLET | Freq: Four times a day (QID) | ORAL | 0 refills | Status: DC | PRN

## 2022-12-15 NOTE — Therapy (Signed)
OUTPATIENT PHYSICAL THERAPY LOWER EXTREMITY TREATMENT   Patient Name: Melissa Maldonado MRN: [id number] DOB:1941-04-08, 82 y.o., female Today's Date: 12/15/2022  Progress Note Reporting Period 11/20/22 to 12/15/22  See note below for Objective Data and Assessment of Progress/Goals.      END OF SESSION:  PT End of Session - 12/15/22 1338     Visit Number 10    Number of Visits 21    Date for PT Melissa-Evaluation 01/15/23    Authorization Type MCR and BCBS State    Authorization Time Period 11/20/22 to 01/15/23    Progress Note Due on Visit 20    PT Start Time 1302    PT Stop Time 1346    PT Time Calculation (min) 44 min    Activity Tolerance Patient limited by pain    Behavior During Therapy [facility] for tasks assessed/performed                   Past Medical History:  Diagnosis Date   Arthritis    Chronic back pain    lumbar stenosis   Dysrhythmia    Early cataracts, bilateral    GERD (gastroesophageal reflux disease)    occasionally but no meds required   History of blood transfusion    History of colon polyps    Hyperlipidemia    takes Lipitor every other day   Hypertension    takes losartan daily   Neuropathy    Osteoarthritis of left knee 04/27/2014   Restless leg    Shortness of breath    with exertion   SOB (shortness of breath) 2013   CPET   Tear of medial meniscus of left knee 09/08/2013   Past Surgical History:  Procedure Laterality Date   APPENDECTOMY     BACK SURGERY  07/20/2008   lumb fusion   COLONOSCOPY  2018   EYE SURGERY     both cataracts   KNEE ARTHROSCOPY WITH MEDIAL MENISECTOMY Left 09/08/2013   Procedure: LEFT KNEE ARTHROSCOPY WITH PARTIAL MEDIAL MENISCECTOMY;  Surgeon: Eulas Post, MD;  Location: [facility];  Service: Orthopedics;  Laterality: Left;   LUMBAR LAMINECTOMY/DECOMPRESSION MICRODISCECTOMY  05/13/2012   Procedure: LUMBAR LAMINECTOMY/DECOMPRESSION MICRODISCECTOMY 1 LEVEL;  Surgeon: Barnett Abu, MD;   Location: [facility];  Service: Neurosurgery;  Laterality: Bilateral;  Bilateral Lumbar one -two Decompressive Laminectomy   PARTIAL KNEE ARTHROPLASTY Left 04/27/2014   Procedure: LEFT UNICOMPARTMENTAL KNEE;  Surgeon: Eulas Post, MD;  Location: [facility];  Service: Orthopedics;  Laterality: Left;   TOTAL KNEE ARTHROPLASTY Right 11/06/2022   Procedure: RIGHT TOTAL KNEE ARTHROPLASTY;  Surgeon: Kathryne Hitch, MD;  Location: [facility];  Service: Orthopedics;  Laterality: Right;   TUBAL LIGATION  1974   UPPER GI ENDOSCOPY     Patient Active Problem List   Diagnosis Date Noted   Status post total right knee replacement 11/06/2022   Unilateral primary osteoarthritis, right knee 09/07/2022   Arthritis of right acromioclavicular joint 08/28/2021   Impingement syndrome of right shoulder 08/19/2021   Nontraumatic incomplete tear of right rotator cuff 08/19/2021   Paresthesia 12/05/2020   Low back pain with right-sided sciatica 12/05/2020   Trochanteric bursitis, right hip 04/24/2020   Essential hypertension 01/12/2020   Sinus bradycardia 01/12/2020   Dyspnea on exertion 01/12/2020   Lumbar stenosis with neurogenic claudication 02/01/2017   Osteoarthritis of left knee 04/27/2014    PCP: Merri Brunette MD   REFERRING PROVIDER: Kathryne Hitch, MD  REFERRING DIAG: A54.098 (ICD-10-CM) - Status post total right knee replacement M17.11 (ICD-10-CM) - Unilateral primary osteoarthritis, right knee  THERAPY DIAG:  Stiffness of right knee, not elsewhere classified  Acute pain of right knee  Muscle weakness (generalized)  Localized edema  Difficulty in walking, not elsewhere classified  Rationale for Evaluation and Treatment: Rehabilitation  ONSET DATE: 11/09/2022  SUBJECTIVE:   SUBJECTIVE STATEMENT:  Nothing new going on, knee is not as sore as it was, rode my bike today. Back is still giving me a lot of problems, I might need to go to another doctor  for my back. Getting a new bed today.   PERTINENT HISTORY: Hospital Course: Melissa Maldonado is an 82 y.o. female who was admitted 11/06/2022 for operative treatment ofUnilateral primary osteoarthritis, right knee. Patient has severe unremitting pain that affects sleep, daily activities, and work/hobbies. After pre-op clearance the patient was taken to the operating room on 11/06/2022 and underwent  Procedure(s): RIGHT TOTAL KNEE ARTHROPLASTY.   PMH: OA, right rotator cuff tear, LBP w/ sciatica, lumbar stenosis, back surgery lumbar fusion & discectomy,  right hip bursitis, HEN, bradycardia,    PAIN:  Are you having pain?  Yes: NPRS scale:  "I don't have pain but its a little achey", did not give NPRS score  Pain location: R knee  Pain description: aching  Aggravating factors: doing too much the knee  Relieving factors: unsure, maybe ice   PRECAUTIONS: None  WEIGHT BEARING RESTRICTIONS: No  FALLS:  Has patient fallen in last 6 months? No  LIVING ENVIRONMENT: Lives with: lives with their spouse Lives in: House/apartment Stairs: 4 STE B rails, split level home with 6 steps up/6 steps down U rail  Has following equipment at home: Single point cane, Walker - 2 wheeled, and toilet raiser   OCCUPATION: retired   PLOF: Independent, Independent with basic ADLs, Independent with gait, and Independent with transfers  PATIENT GOALS: be able to walk without pain  NEXT MD VISIT: Dr. Magnus Ivan May 30th   OBJECTIVE:   DIAGNOSTIC FINDINGS:   PATIENT SURVEYS:  FOTO 55, predicted 21  COGNITION: Overall cognitive status:  possible STM impairment, family not present to confirm    SENSATION: Not tested  EDEMA:   Some excessive pitting edema in surgical calf, no temperature or color changes, Homan's negative; on daily Xarelto per her report and per chart, she tells me she has been compliant with this medication   LOWER EXTREMITY ROM:  Active ROM Right eval Right 11/23/22 Right  11/30/22  Right 12/07/22 Right 12/15/22  Hip flexion       Hip extension       Hip abduction       Hip adduction       Hip internal rotation       Hip external rotation       Knee flexion 64* supine  Seated P: 81* Seated 64-71* PROM and AAROM Seated A: 71* P: 82* Supine  A: 63*, AAROM in sitting 62*, passive 74*  Knee extension 16* supine with heel prop  Supine With heel prop P: -14* Seated with foot supported on stool 9* PROM Supine  A: Quad set -7* Supine with towel roll under heel 22*, seated PROM 12*  Ankle dorsiflexion       Ankle plantarflexion       Ankle inversion       Ankle eversion        (Blank rows = not tested)  LOWER EXTREMITY MMT:  MMT Right eval Left eval Right 12/15/22 Left 12/15/22  Hip flexion 3+ 4+ 4+ 4+  Hip extension      Hip abduction      Hip adduction      Hip internal rotation      Hip external rotation      Knee flexion 4 4 4- 4-  Knee extension 2 4 4 5   Ankle dorsiflexion 5 5    Ankle plantarflexion      Ankle inversion      Ankle eversion       (Blank rows = not tested)    FUNCTIONAL TESTS:  5 times sit to stand: 43 seconds no UEs; 12/15/22- 15.5 seconds no UEs, severe offshift from R LE/compensations Timed up and go (TUG): 19.5 seconds SPC; 12/15/22- 14.7 seconds SPC   3 minute walk test: 330ft SPC; 12/15/22- 566ft SPC    GAIT: Distance walked: 369ft Assistive device utilized: Single point cane Level of assistance: Modified independence Comments: slow but steady, able to make sharp turns well at each end of walking course, used SPC on wrong side, foot flat and limited heel-toe pattern, limited R knee flexion/extension in stance/swing phases    TODAY'S TREATMENT:                                                                                                                              DATE:    12/15/22  FOTO and objectives for progress note  FOTO 41 (decrease of 14 points)  Extensive education on progress in PT, also specific education  on importance of doing HEP for TKR specifically instead of just riding bike and doing steps/select exercises at home. Seeing Dr. Magnus Ivan in 2 days, general education regarding potential mention of manipulation given limited progress in terms of ROM.   Scifit bike x8 minutes seat 10 full rotations BUEs/LEs- able to get full rotations without severe compensations, refused to allow PT to progress bike seat/ROM of knee on bike    12/09/2022: Therapeutic Exercise Scifit bike seat 10 x  5 minutes with BLEs & BUEs then 3 min with BLEs only, full rotations level 1 Leg press BLEs 81# 10 reps 2 sets; RLE only 37# 10 reps Hamstring stretch passive 30 sec hold 2 reps Gastroc stretch step heel depression 30 sec hold 2 reps  Heel raises BLEs 10 reps with right UE support Step up RLE and step down LLE (RLE eccentric) 6" step BUE support //bars 5 reps. PT demo & verbal cues on technique.  Slider to 3 cones (ant-lat, lat & post-lat) with stance LE knee flexion on outward motion and extension on inward motion. 3 reps ea LE.   Functional squat to pick up items from floor 5 reps with supervision.   Gait Training: Pt amb 50-75' X 3 within clinic with cane with supervision  Manual PROM with overpressure & Hypervolt to quad for knee flexion seated PROM with overpressure & quad set for knee  ext supine  LLE dual hose for ankle & knee medium compression 34* 5 min only (pt request) with elevation  12/07/2022: Therapeutic Exercise Scifit bike seat 10 x  6 minutes, full rotations level 1 Leg press BLEs 81# 10 reps 2 sets; RLE only 31# 10 reps Hamstring stretch passive 30 sec hold 2 reps Tandem stance on foam beam RLE in front & in back 30 sec with single touch ea  Gastroc stretch step heel depression 30 sec hold 2 reps  Heel raises BLEs 10 reps with light UE support SAQs 5# 10 reps 2 sets with 2 second hold Seated LAQ & active knee flexion with contralateral LE opposing motion 10 reps 2 sets. LAQ without LLE  knee flexion -14* and with LLE knee flexion -9*  Gait Training: Pt amb 50-75' X 3 within clinic with cane with supervision Pt appears safe to initiate cane use in home where distances are short & no barriers. Pt verbalized understanding  Manual PROM with overpressure & Hypervolt to quad for knee flexion seated PROM with overpressure & quad set for knee ext supine Scar mobs  LLE knee medium compression 34* 5 min only (pt request) with elevation   12/04/22  TherEx  Scifit bike seat 10 x6 minutes, partial rotations  LAQs 3# x15 with 2 second hold HS curls red TB x15 Self knee flexion AAROM x15 with 5 second holds R LE Knee flexion holds 3x30 seconds edge of mat table Knee extension stretch x4 minutes 3#   Manual  Retrograde massage for edema management LE elevated     HOME EXERCISE PROGRAM: Access Code: [email or web address] URL: [email or web address] Date: 11/23/2022 Prepared by: Vladimir Faster  Exercises - Ankle Alphabet in Elevation  - 2-4 x daily - 7 x weekly - 1 sets - 1 reps - Supine Single Leg Ankle Pumps  - 1 x daily - 5 x weekly - 1 sets - 10 reps - 5 seconds hold - Quad Setting and Stretching  - 2-4 x daily - 7 x weekly - 5-10 sets - 10 reps - 5 sec hold - Supine Heel Slide with Strap  - 2-3 x daily - 7 x weekly - 2-3 sets - 10 reps - 5 seconds hold - Supine Knee Extension Strengthening  - 1 x daily - 7 x weekly - 2-3 sets - 10 reps - 5 seconds hold - Supine Straight Leg Raises  - 2-3 x daily - 7 x weekly - 2-3 sets - 10 reps - 5 seconds hold - Seated Knee Flexion Extension AROM   - 2-4 x daily - 7 x weekly - 2-3 sets - 10 reps - 5 seconds hold - Seated straight leg lifts  - 2-3 x daily - 7 x weekly - 2-3 sets - 10 reps - 5 seconds hold - Seated Hamstring Stretch with Strap  - 2-4 x daily - 7 x weekly - 1 sets - 3 reps - 20-30 seconds hold - Seated Long Arc Quad  - 1 x daily - 7 x weekly - 2-3 sets - 10 reps - 5 seconds hold - Heel raises near counter  - 1 x  daily - 5 x weekly - 1 sets - 10 reps - 5 seconds hold - standing calf stretch with forefoot on small step or brick  - 1 x daily - 7 x weekly - 1 sets - 3 reps - 20-30 seconds hold - Standing Knee Flexion with Counter Support  - 1 x daily - 7 x weekly -  12-3 sets - 10 reps - 5 seconds hold - Standing March with Counter Support  - 1 x daily - 7 x weekly - 2-3 sets - 10 reps - 5 seconds hold - standing knee extension  - 1 x daily - 7 x weekly - 2-3 sets - 10 reps - 5 seconds hold   ASSESSMENT: CLINICAL IMPRESSION:  Completed FOTO, objective measures, and goal review necessary for progress note. She is improving in functional tests such as TUG, 5x STS, , etc, however remains extremely limited in terms of knee ROM. Of note, her back has been a significant limiting factor in her TKR rehab. Does not sound like she has been 100% compliant with TKR HEP. She sees the referring surgeon on the 30th- will continue efforts however given limited progress with her knee ROM, I am concerned that she may potentially end up needing a manipulation pending surgeon's recommendations.    OBJECTIVE IMPAIRMENTS: Abnormal gait, decreased activity tolerance, decreased balance, decreased coordination, decreased mobility, difficulty walking, decreased ROM, decreased strength, hypomobility, increased edema, increased fascial restrictions, impaired flexibility, impaired UE functional use, and pain.   ACTIVITY LIMITATIONS: sitting, standing, squatting, stairs, transfers, bed mobility, and locomotion level  PARTICIPATION LIMITATIONS: cleaning, laundry, driving, shopping, community activity, occupation, and yard work  PERSONAL FACTORS: Age, Behavior pattern, Education, Fitness, Past/current experiences, Social background, and Time since onset of injury/illness/exacerbation are also affecting patient's functional outcome.   REHAB POTENTIAL: Good  CLINICAL DECISION MAKING: Stable/uncomplicated  EVALUATION COMPLEXITY:  Low   GOALS: Goals reviewed with patient? Yes  SHORT TERM GOALS: Target date: 12/18/2022   Will be compliant with appropriate progressive HEP  Baseline: Goal status: 12/15/22- ONGOING limited compliance   2.  R knee AROM to be no more than 5 degrees extension and no less than 90 degrees flexion  Baseline:  Goal status: 12/15/22- ONGOING very limited ROM (see above)  3.  Will be independent with management techniques for localized edema including wearing compression stockings and appropriate ice routine  Baseline:  Goal status: 12/12/22- MET   4.  Gait pattern to have normalized and she will demonstrate steady gait with use of SPC on appropriate side/good technique with SPC  Baseline:  Goal status: 12/15/22- ONGOING, still has significant gait deviations due to limited ROM in surgical knee    LONG TERM GOALS: Target date: 01/15/2023    MMT to have improved by 1 grade in all weak muscles  Baseline:  Goal status: 12/15/22- ONGOING, improving but not at goal level   2.  R knee flexion AROM to be at least 105 degrees  Baseline:  Goal status: 12/15/22- ONGOING, severe limitations   3.  Will be able to ascend/descend steps with U rail and reciprocal pattern, no increase in pain  Baseline:  Goal status: 12/15/22- ONGOING, did not test in clinic, reports step to pattern   4.  Will complete 5x sit to stand test in 20 seconds or less no UEs  Baseline:  Goal status: 12/15/22- MET 15.5 seconds no UEs   5.  Will complete TUG test in 13 seconds or less with LRAD to show improved functional balance Baseline:  Goal status: 12/15/22- ONGOING   6.  Will ambulate at least 596ft with LRAD in to show improved mobility and community access  Baseline:  Goal status: 12/15/22- MET    PLAN:  PT FREQUENCY:  3x/week for 4 weeks, then drop to 2x/week for 4 weeks   PT DURATION: 8 weeks  PLANNED INTERVENTIONS:  Therapeutic exercises, Therapeutic activity, Neuromuscular Melissa-education, Balance  training, Gait training, Patient/Family education, Self Care, Joint mobilization, Stair training, Orthotic/Fit training, DME instructions, Aquatic Therapy, Dry Needling, Electrical stimulation, Cryotherapy, Moist heat, Taping, Vasopneumatic device, Ultrasound, Ionotophoresis 4mg /ml Dexamethasone, Manual therapy, and Melissa-evaluation  PLAN FOR NEXT SESSION:  aggressive ROM focus, continue to encourage HEP compliance.    Nedra Hai PT DPT PN2

## 2022-12-15 NOTE — Telephone Encounter (Signed)
Patient was here. Would like a refill on oxycodone called in. Her number is [phone]

## 2022-12-17 ENCOUNTER — Encounter: Payer: Self-pay | Admitting: Physical Therapy

## 2022-12-17 ENCOUNTER — Ambulatory Visit (INDEPENDENT_AMBULATORY_CARE_PROVIDER_SITE_OTHER): Payer: Medicare Other | Admitting: Orthopaedic Surgery

## 2022-12-17 ENCOUNTER — Encounter: Payer: Self-pay | Admitting: Orthopaedic Surgery

## 2022-12-17 ENCOUNTER — Ambulatory Visit (INDEPENDENT_AMBULATORY_CARE_PROVIDER_SITE_OTHER): Payer: Medicare Other | Admitting: Physical Therapy

## 2022-12-17 DIAGNOSIS — M6281 Muscle weakness (generalized): Secondary | ICD-10-CM | POA: Diagnosis not present

## 2022-12-17 DIAGNOSIS — R600 Localized edema: Secondary | ICD-10-CM | POA: Diagnosis not present

## 2022-12-17 DIAGNOSIS — Z96651 Presence of right artificial knee joint: Secondary | ICD-10-CM

## 2022-12-17 DIAGNOSIS — M25561 Pain in right knee: Secondary | ICD-10-CM

## 2022-12-17 DIAGNOSIS — M25661 Stiffness of right knee, not elsewhere classified: Secondary | ICD-10-CM | POA: Diagnosis not present

## 2022-12-17 DIAGNOSIS — R262 Difficulty in walking, not elsewhere classified: Secondary | ICD-10-CM

## 2022-12-17 DIAGNOSIS — T8482XD Fibrosis due to internal orthopedic prosthetic devices, implants and grafts, subsequent encounter: Secondary | ICD-10-CM

## 2022-12-17 MED ORDER — TIZANIDINE HCL 4 MG PO TABS: 4.0000 mg | ORAL_TABLET | Freq: Three times a day (TID) | ORAL | 0 refills | Status: DC | PRN

## 2022-12-17 NOTE — Progress Notes (Signed)
The patient comes in today and now 6 weeks status post a right total knee replacement.  Unfortunately she has developed significant postoperative arthrofibrosis.  She cannot take anti-inflammatories since she is on chronic blood thinning medication.  She is still taking oxycodone.  She is 82 years old and therapy has been only able to flex her to maybe 74 degrees.  Even in the office today I can maybe get her to 70 degrees.  The incision is healed nicely and her calf is soft on that right side.  I let her know that we need to perform an manipulation under anesthesia in order to get the best results from her knee replacement.  Her left knee moves well but that was a partial knee replacement that was done 9 years ago and those are usually less painful.  I will send in some Zanaflex for muscle relaxant and we already refilled her oxycodone this week.  We will work on setting her up for a manipulation under anesthesia and then we can see her back about 2 weeks after that for repeat exam.  We need to make sure she has therapy the day of that manipulation as well and that she push herself hard.  She understands this fully.

## 2022-12-17 NOTE — Therapy (Signed)
OUTPATIENT PHYSICAL THERAPY LOWER EXTREMITY TREATMENT   Patient Name: Melissa Maldonado MRN: [id number] DOB:07-27-1940, 82 y.o., female Today's Date: 12/17/2022     END OF SESSION:  PT End of Session - 12/17/22 1421     Visit Number 11    Number of Visits 21    Date for PT Re-Evaluation 01/15/23    Authorization Type MCR and BCBS State    Authorization Time Period 11/20/22 to 01/15/23    Progress Note Due on Visit 20    PT Start Time 1401   pt late   PT Stop Time 1425    PT Time Calculation (min) 24 min    Activity Tolerance Patient limited by pain    Behavior During Therapy Agitated;Impulsive                    Past Medical History:  Diagnosis Date   Arthritis    Chronic back pain    lumbar stenosis   Dysrhythmia    Early cataracts, bilateral    GERD (gastroesophageal reflux disease)    occasionally but no meds required   History of blood transfusion    History of colon polyps    Hyperlipidemia    takes Lipitor every other day   Hypertension    takes losartan daily   Neuropathy    Osteoarthritis of left knee 04/27/2014   Restless leg    Shortness of breath    with exertion   SOB (shortness of breath) 2013   CPET   Tear of medial meniscus of left knee 09/08/2013   Past Surgical History:  Procedure Laterality Date   APPENDECTOMY     BACK SURGERY  07/20/2008   lumb fusion   COLONOSCOPY  2018   EYE SURGERY     both cataracts   KNEE ARTHROSCOPY WITH MEDIAL MENISECTOMY Left 09/08/2013   Procedure: LEFT KNEE ARTHROSCOPY WITH PARTIAL MEDIAL MENISCECTOMY;  Surgeon: Melissa Post, MD;  Location: [facility];  Service: Orthopedics;  Laterality: Left;   LUMBAR LAMINECTOMY/DECOMPRESSION MICRODISCECTOMY  05/13/2012   Procedure: LUMBAR LAMINECTOMY/DECOMPRESSION MICRODISCECTOMY 1 LEVEL;  Surgeon: Melissa Abu, MD;  Location: [facility];  Service: Neurosurgery;  Laterality: Bilateral;  Bilateral Lumbar one -two Decompressive Laminectomy    PARTIAL KNEE ARTHROPLASTY Left 04/27/2014   Procedure: LEFT UNICOMPARTMENTAL KNEE;  Surgeon: Melissa Post, MD;  Location: [facility];  Service: Orthopedics;  Laterality: Left;   TOTAL KNEE ARTHROPLASTY Right 11/06/2022   Procedure: RIGHT TOTAL KNEE ARTHROPLASTY;  Surgeon: Melissa Hitch, MD;  Location: [facility];  Service: Orthopedics;  Laterality: Right;   TUBAL LIGATION  1974   UPPER GI ENDOSCOPY     Patient Active Problem List   Diagnosis Date Noted   Status Maldonado total right knee replacement 11/06/2022   Unilateral primary osteoarthritis, right knee 09/07/2022   Arthritis of right acromioclavicular joint 08/28/2021   Impingement syndrome of right shoulder 08/19/2021   Nontraumatic incomplete tear of right rotator cuff 08/19/2021   Paresthesia 12/05/2020   Low back pain with right-sided sciatica 12/05/2020   Trochanteric bursitis, right hip 04/24/2020   Essential hypertension 01/12/2020   Sinus bradycardia 01/12/2020   Dyspnea on exertion 01/12/2020   Lumbar stenosis with neurogenic claudication 02/01/2017   Osteoarthritis of left knee 04/27/2014    PCP: Melissa Brunette MD   REFERRING PROVIDER: Kathryne Hitch, MD  REFERRING DIAG: [phone] (ICD-10-CM) - Status Maldonado total right knee replacement M17.11 (ICD-10-CM) - Unilateral primary osteoarthritis, right knee  THERAPY DIAG:  Stiffness of right knee, not elsewhere classified  Acute pain of right knee  Muscle weakness (generalized)  Localized edema  Difficulty in walking, not elsewhere classified  Rationale for Evaluation and Treatment: Rehabilitation  ONSET DATE: 11/09/2022  SUBJECTIVE:   SUBJECTIVE STATEMENT:  I've been doing my exercises like you told me to and I am really having a lot of pain. Got stuck in [place] traffic coming here. I can barely walk now due to pain. Just do what what you can for my knee today.    PERTINENT HISTORY: Hospital Course: Melissa Maldonado is an 82 y.o.  female who was admitted 11/06/2022 for operative treatment ofUnilateral primary osteoarthritis, right knee. Patient has severe unremitting pain that affects sleep, daily activities, and work/hobbies. After pre-op clearance the patient was taken to the operating room on 11/06/2022 and underwent  Procedure(s): RIGHT TOTAL KNEE ARTHROPLASTY.   PMH: OA, right rotator cuff tear, LBP w/ sciatica, lumbar stenosis, back surgery lumbar fusion & discectomy,  right hip bursitis, HEN, bradycardia,    PAIN:  Are you having pain?  Yes: NPRS scale:  8/10 Pain location: R knee  Pain description: Pain Aggravating factors: doing too much the knee  Relieving factors: unsure, maybe ice   PRECAUTIONS: None  WEIGHT BEARING RESTRICTIONS: No  FALLS:  Has patient fallen in last 6 months? No  LIVING ENVIRONMENT: Lives with: lives with their spouse Lives in: House/apartment Stairs: 4 STE B rails, split level home with 6 steps up/6 steps down U rail  Has following equipment at home: Single point cane, Walker - 2 wheeled, and toilet raiser   OCCUPATION: retired   PLOF: Independent, Independent with basic ADLs, Independent with gait, and Independent with transfers  PATIENT GOALS: be able to walk without pain  NEXT MD VISIT: Melissa Maldonado May 30th   OBJECTIVE:   DIAGNOSTIC FINDINGS:   PATIENT SURVEYS:  FOTO 55, predicted 23  COGNITION: Overall cognitive status:  possible STM impairment, family not present to confirm    SENSATION: Not tested  EDEMA:   Some excessive pitting edema in surgical calf, no temperature or color changes, Homan's negative; on daily Xarelto per her report and per chart, she tells me she has been compliant with this medication   LOWER EXTREMITY ROM:  Active ROM Right eval Right 11/23/22 Right  11/30/22 Right 12/07/22 Right 12/15/22  Hip flexion       Hip extension       Hip abduction       Hip adduction       Hip internal rotation       Hip external rotation       Knee  flexion 64* supine  Seated P: 81* Seated 64-71* PROM and AAROM Seated A: 71* P: 82* Supine  A: 63*, AAROM in sitting 62*, passive 74*  Knee extension 16* supine with heel prop  Supine With heel prop P: -14* Seated with foot supported on stool 9* PROM Supine  A: Quad set -7* Supine with towel roll under heel 22*, seated PROM 12*  Ankle dorsiflexion       Ankle plantarflexion       Ankle inversion       Ankle eversion        (Blank rows = not tested)  LOWER EXTREMITY MMT:  MMT Right eval Left eval Right 12/15/22 Left 12/15/22  Hip flexion 3+ 4+ 4+ 4+  Hip extension      Hip abduction  Hip adduction      Hip internal rotation      Hip external rotation      Knee flexion 4 4 4- 4-  Knee extension 2 4 4 5   Ankle dorsiflexion 5 5    Ankle plantarflexion      Ankle inversion      Ankle eversion       (Blank rows = not tested)    FUNCTIONAL TESTS:  5 times sit to stand: 43 seconds no UEs; 12/15/22- 15.5 seconds no UEs, severe offshift from R LE/compensations Timed up and go (TUG): 19.5 seconds SPC; 12/15/22- 14.7 seconds SPC   3 minute walk test: 363ft SPC; 12/15/22- 565ft SPC    GAIT: Distance walked: 351ft Assistive device utilized: Single point cane Level of assistance: Modified independence Comments: slow but steady, able to make sharp turns well at each end of walking course, used SPC on wrong side, foot flat and limited heel-toe pattern, limited R knee flexion/extension in stance/swing phases    TODAY'S TREATMENT:                                                                                                                              DATE:   12/17/22  Scifit bike seat 11 x6 minutes full rotations  SAQs 3# 2x15 with ice pack on knee Knee extension stretch with ice pack on knee and heel prop x3 minutes supine  Lots of time spent today explaining and redirecting patient    12/15/22  FOTO and objectives for progress note  FOTO 41 (decrease of 14  points)  Extensive education on progress in PT, also specific education on importance of doing HEP for TKR specifically instead of just riding bike and doing steps/select exercises at home. Seeing Melissa Maldonado in 2 days, general education regarding potential mention of manipulation given limited progress in terms of ROM.   Scifit bike x8 minutes seat 10 full rotations BUEs/LEs- able to get full rotations without severe compensations, refused to allow PT to progress bike seat/ROM of knee on bike    12/09/2022: Therapeutic Exercise Scifit bike seat 10 x  5 minutes with BLEs & BUEs then 3 min with BLEs only, full rotations level 1 Leg press BLEs 81# 10 reps 2 sets; RLE only 37# 10 reps Hamstring stretch passive 30 sec hold 2 reps Gastroc stretch step heel depression 30 sec hold 2 reps  Heel raises BLEs 10 reps with right UE support Step up RLE and step down LLE (RLE eccentric) 6" step BUE support //bars 5 reps. PT demo & verbal cues on technique.  Slider to 3 cones (ant-lat, lat & Maldonado-lat) with stance LE knee flexion on outward motion and extension on inward motion. 3 reps ea LE.   Functional squat to pick up items from floor 5 reps with supervision.   Gait Training: Pt amb 50-75' X 3 within clinic with cane with supervision  Manual PROM with overpressure & Hypervolt  to quad for knee flexion seated PROM with overpressure & quad set for knee ext supine  LLE dual hose for ankle & knee medium compression 34* 5 min only (pt request) with elevation  12/07/2022: Therapeutic Exercise Scifit bike seat 10 x  6 minutes, full rotations level 1 Leg press BLEs 81# 10 reps 2 sets; RLE only 31# 10 reps Hamstring stretch passive 30 sec hold 2 reps Tandem stance on foam beam RLE in front & in back 30 sec with single touch ea  Gastroc stretch step heel depression 30 sec hold 2 reps  Heel raises BLEs 10 reps with light UE support SAQs 5# 10 reps 2 sets with 2 second hold Seated LAQ & active knee flexion  with contralateral LE opposing motion 10 reps 2 sets. LAQ without LLE knee flexion -14* and with LLE knee flexion -9*  Gait Training: Pt amb 50-75' X 3 within clinic with cane with supervision Pt appears safe to initiate cane use in home where distances are short & no barriers. Pt verbalized understanding  Manual PROM with overpressure & Hypervolt to quad for knee flexion seated PROM with overpressure & quad set for knee ext supine Scar mobs  LLE knee medium compression 34* 5 min only (pt request) with elevation   12/04/22  TherEx  Scifit bike seat 10 x6 minutes, partial rotations  LAQs 3# x15 with 2 second hold HS curls red TB x15 Self knee flexion AAROM x15 with 5 second holds R LE Knee flexion holds 3x30 seconds edge of mat table Knee extension stretch x4 minutes 3#   Manual  Retrograde massage for edema management LE elevated     HOME EXERCISE PROGRAM: Access Code: [email or web address] URL: [email or web address] Date: 11/23/2022 Prepared by: Vladimir Faster  Exercises - Ankle Alphabet in Elevation  - 2-4 x daily - 7 x weekly - 1 sets - 1 reps - Supine Single Leg Ankle Pumps  - 1 x daily - 5 x weekly - 1 sets - 10 reps - 5 seconds hold - Quad Setting and Stretching  - 2-4 x daily - 7 x weekly - 5-10 sets - 10 reps - 5 sec hold - Supine Heel Slide with Strap  - 2-3 x daily - 7 x weekly - 2-3 sets - 10 reps - 5 seconds hold - Supine Knee Extension Strengthening  - 1 x daily - 7 x weekly - 2-3 sets - 10 reps - 5 seconds hold - Supine Straight Leg Raises  - 2-3 x daily - 7 x weekly - 2-3 sets - 10 reps - 5 seconds hold - Seated Knee Flexion Extension AROM   - 2-4 x daily - 7 x weekly - 2-3 sets - 10 reps - 5 seconds hold - Seated straight leg lifts  - 2-3 x daily - 7 x weekly - 2-3 sets - 10 reps - 5 seconds hold - Seated Hamstring Stretch with Strap  - 2-4 x daily - 7 x weekly - 1 sets - 3 reps - 20-30 seconds hold - Seated Long Arc Quad  - 1 x daily - 7 x weekly - 2-3  sets - 10 reps - 5 seconds hold - Heel raises near counter  - 1 x daily - 5 x weekly - 1 sets - 10 reps - 5 seconds hold - standing calf stretch with forefoot on small step or brick  - 1 x daily - 7 x weekly - 1 sets - 3 reps - 20-30 seconds hold - Standing  Knee Flexion with Counter Support  - 1 x daily - 7 x weekly - 12-3 sets - 10 reps - 5 seconds hold - Standing March with Counter Support  - 1 x daily - 7 x weekly - 2-3 sets - 10 reps - 5 seconds hold - standing knee extension  - 1 x daily - 7 x weekly - 2-3 sets - 10 reps - 5 seconds hold   ASSESSMENT: CLINICAL IMPRESSION:  Ms. Beamon arrives today having more pain than her typical-  she was late due to traffic near the clinic as well. Was on and off agitated towards PT this session- seemed to become upset when I spoke loudly because Ms. Gainous could not hear me and when I was trying to determine how her back was doing/if she could tolerate supine exercises today. She was also impulsive this session and needed close cuing for safety- told me "I'm mad because the traffic made me late I do not like being marked as LATE". We did what we could as time and pain allowed this session. Spent time explaining purpose of exercises/giving cues for safety given impulsivity today. She sees the MD for Maldonado-op f/u today, interested to see surgeon's thoughts about her knee. See progress note from the 28th for currently PT concerns regarding course of rehab.   OBJECTIVE IMPAIRMENTS: Abnormal gait, decreased activity tolerance, decreased balance, decreased coordination, decreased mobility, difficulty walking, decreased ROM, decreased strength, hypomobility, increased edema, increased fascial restrictions, impaired flexibility, impaired UE functional use, and pain.   ACTIVITY LIMITATIONS: sitting, standing, squatting, stairs, transfers, bed mobility, and locomotion level  PARTICIPATION LIMITATIONS: cleaning, laundry, driving, shopping, community activity, occupation,  and yard work  PERSONAL FACTORS: Age, Behavior pattern, Education, Fitness, Past/current experiences, Social background, and Time since onset of injury/illness/exacerbation are also affecting patient's functional outcome.   REHAB POTENTIAL: Good  CLINICAL DECISION MAKING: Stable/uncomplicated  EVALUATION COMPLEXITY: Low   GOALS: Goals reviewed with patient? Yes  SHORT TERM GOALS: Target date: 12/18/2022   Will be compliant with appropriate progressive HEP  Baseline: Goal status: 12/15/22- ONGOING limited compliance   2.  R knee AROM to be no more than 5 degrees extension and no less than 90 degrees flexion  Baseline:  Goal status: 12/15/22- ONGOING very limited ROM (see above)  3.  Will be independent with management techniques for localized edema including wearing compression stockings and appropriate ice routine  Baseline:  Goal status: 12/12/22- MET   4.  Gait pattern to have normalized and she will demonstrate steady gait with use of SPC on appropriate side/good technique with SPC  Baseline:  Goal status: 12/15/22- ONGOING, still has significant gait deviations due to limited ROM in surgical knee    LONG TERM GOALS: Target date: 01/15/2023    MMT to have improved by 1 grade in all weak muscles  Baseline:  Goal status: 12/15/22- ONGOING, improving but not at goal level   2.  R knee flexion AROM to be at least 105 degrees  Baseline:  Goal status: 12/15/22- ONGOING, severe limitations   3.  Will be able to ascend/descend steps with U rail and reciprocal pattern, no increase in pain  Baseline:  Goal status: 12/15/22- ONGOING, did not test in clinic, reports step to pattern   4.  Will complete 5x sit to stand test in 20 seconds or less no UEs  Baseline:  Goal status: 12/15/22- MET 15.5 seconds no UEs   5.  Will complete TUG test in 13 seconds or  less with LRAD to show improved functional balance Baseline:  Goal status: 12/15/22- ONGOING   6.  Will ambulate at least 527ft  with LRAD in to show improved mobility and community access  Baseline:  Goal status: 12/15/22- MET    PLAN:  PT FREQUENCY:  3x/week for 4 weeks, then drop to 2x/week for 4 weeks   PT DURATION: 8 weeks  PLANNED INTERVENTIONS: Therapeutic exercises, Therapeutic activity, Neuromuscular re-education, Balance training, Gait training, Patient/Family education, Self Care, Joint mobilization, Stair training, Orthotic/Fit training, DME instructions, Aquatic Therapy, Dry Needling, Electrical stimulation, Cryotherapy, Moist heat, Taping, Vasopneumatic device, Ultrasound, Ionotophoresis 4mg /ml Dexamethasone, Manual therapy, and Re-evaluation  PLAN FOR NEXT SESSION:  aggressive ROM focus, continue to encourage HEP compliance.  What did MD say about possible manip?    Nedra Hai PT DPT PN2

## 2022-12-18 ENCOUNTER — Encounter: Payer: Self-pay | Admitting: Physical Therapy

## 2022-12-18 ENCOUNTER — Ambulatory Visit (INDEPENDENT_AMBULATORY_CARE_PROVIDER_SITE_OTHER): Payer: Medicare Other | Admitting: Physical Therapy

## 2022-12-18 DIAGNOSIS — R600 Localized edema: Secondary | ICD-10-CM | POA: Diagnosis not present

## 2022-12-18 DIAGNOSIS — M25561 Pain in right knee: Secondary | ICD-10-CM

## 2022-12-18 DIAGNOSIS — R262 Difficulty in walking, not elsewhere classified: Secondary | ICD-10-CM | POA: Diagnosis not present

## 2022-12-18 DIAGNOSIS — M25661 Stiffness of right knee, not elsewhere classified: Secondary | ICD-10-CM | POA: Diagnosis not present

## 2022-12-18 DIAGNOSIS — M6281 Muscle weakness (generalized): Secondary | ICD-10-CM | POA: Diagnosis not present

## 2022-12-18 NOTE — Therapy (Signed)
OUTPATIENT PHYSICAL THERAPY LOWER EXTREMITY TREATMENT   Patient Name: Melissa Maldonado MRN: [id number] DOB:1941/06/17, 82 y.o., female Today's Date: 12/18/2022     END OF SESSION:  PT End of Session - 12/18/22 1315     Visit Number 12    Number of Visits 21    Date for PT Re-Evaluation 01/15/23    Authorization Type MCR and BCBS State    Authorization Time Period 11/20/22 to 01/15/23    Progress Note Due on Visit 20    PT Start Time 1302    PT Stop Time 1340    PT Time Calculation (min) 38 min    Activity Tolerance Patient tolerated treatment well    Behavior During Therapy [facility] for tasks assessed/performed                     Past Medical History:  Diagnosis Date   Arthritis    Chronic back pain    lumbar stenosis   Dysrhythmia    Early cataracts, bilateral    GERD (gastroesophageal reflux disease)    occasionally but no meds required   History of blood transfusion    History of colon polyps    Hyperlipidemia    takes Lipitor every other day   Hypertension    takes losartan daily   Neuropathy    Osteoarthritis of left knee 04/27/2014   Restless leg    Shortness of breath    with exertion   SOB (shortness of breath) 2013   CPET   Tear of medial meniscus of left knee 09/08/2013   Past Surgical History:  Procedure Laterality Date   APPENDECTOMY     BACK SURGERY  07/20/2008   lumb fusion   COLONOSCOPY  2018   EYE SURGERY     both cataracts   KNEE ARTHROSCOPY WITH MEDIAL MENISECTOMY Left 09/08/2013   Procedure: LEFT KNEE ARTHROSCOPY WITH PARTIAL MEDIAL MENISCECTOMY;  Surgeon: Melissa Post, MD;  Location: [facility];  Service: Orthopedics;  Laterality: Left;   LUMBAR LAMINECTOMY/DECOMPRESSION MICRODISCECTOMY  05/13/2012   Procedure: LUMBAR LAMINECTOMY/DECOMPRESSION MICRODISCECTOMY 1 LEVEL;  Surgeon: Melissa Abu, MD;  Location: [facility];  Service: Neurosurgery;  Laterality: Bilateral;  Bilateral Lumbar one -two Decompressive  Laminectomy   PARTIAL KNEE ARTHROPLASTY Left 04/27/2014   Procedure: LEFT UNICOMPARTMENTAL KNEE;  Surgeon: Melissa Post, MD;  Location: [facility];  Service: Orthopedics;  Laterality: Left;   TOTAL KNEE ARTHROPLASTY Right 11/06/2022   Procedure: RIGHT TOTAL KNEE ARTHROPLASTY;  Surgeon: Melissa Hitch, MD;  Location: [facility];  Service: Orthopedics;  Laterality: Right;   TUBAL LIGATION  1974   UPPER GI ENDOSCOPY     Patient Active Problem List   Diagnosis Date Noted   Status Maldonado total right knee replacement 11/06/2022   Arthritis of right acromioclavicular joint 08/28/2021   Impingement syndrome of right shoulder 08/19/2021   Nontraumatic incomplete tear of right rotator cuff 08/19/2021   Paresthesia 12/05/2020   Low back pain with right-sided sciatica 12/05/2020   Trochanteric bursitis, right hip 04/24/2020   Essential hypertension 01/12/2020   Sinus bradycardia 01/12/2020   Dyspnea on exertion 01/12/2020   Lumbar stenosis with neurogenic claudication 02/01/2017   Osteoarthritis of left knee 04/27/2014    PCP: Melissa Brunette MD   REFERRING PROVIDER: Kathryne Hitch, MD  REFERRING DIAG: [phone] (ICD-10-CM) - Status Maldonado total right knee replacement M17.11 (ICD-10-CM) - Unilateral primary osteoarthritis, right knee  THERAPY DIAG:  Stiffness of right  knee, not elsewhere classified  Acute pain of right knee  Muscle weakness (generalized)  Localized edema  Difficulty in walking, not elsewhere classified  Rationale for Evaluation and Treatment: Rehabilitation  ONSET DATE: 11/09/2022  SUBJECTIVE:   SUBJECTIVE STATEMENT:  Saw Dr. Magnus Maldonado, he told me we will need to do the manipulation. He didn't give me a time, his office is supposed to call me about the surgery date. He gave me some muscle relaxers and those seem to be helping my back.     PERTINENT HISTORY: Hospital Course: Melissa Maldonado is an 82 y.o. female who was admitted 11/06/2022  for operative treatment ofUnilateral primary osteoarthritis, right knee. Patient has severe unremitting pain that affects sleep, daily activities, and work/hobbies. After pre-op clearance the patient was taken to the operating room on 11/06/2022 and underwent  Procedure(s): RIGHT TOTAL KNEE ARTHROPLASTY.   PMH: OA, right rotator cuff tear, LBP w/ sciatica, lumbar stenosis, back surgery lumbar fusion & discectomy,  right hip bursitis, HEN, bradycardia,    PAIN:  Are you having pain?  Yes: NPRS scale:  5/10 Pain location: R knee  Pain description: sharp  Aggravating factors: doing too much the knee  Relieving factors: unsure, maybe ice   PRECAUTIONS: None  WEIGHT BEARING RESTRICTIONS: No  FALLS:  Has patient fallen in last 6 months? No  LIVING ENVIRONMENT: Lives with: lives with their spouse Lives in: House/apartment Stairs: 4 STE B rails, split level home with 6 steps up/6 steps down U rail  Has following equipment at home: Single point cane, Walker - 2 wheeled, and toilet raiser   OCCUPATION: retired   PLOF: Independent, Independent with basic ADLs, Independent with gait, and Independent with transfers  PATIENT GOALS: be able to walk without pain  NEXT MD VISIT: Dr. Magnus Maldonado May 30th   OBJECTIVE:   DIAGNOSTIC FINDINGS:   PATIENT SURVEYS:  FOTO 55, predicted 65  COGNITION: Overall cognitive status:  possible STM impairment, family not present to confirm    SENSATION: Not tested  EDEMA:   Some excessive pitting edema in surgical calf, no temperature or color changes, Homan's negative; on daily Xarelto per her report and per chart, she tells me she has been compliant with this medication   LOWER EXTREMITY ROM:  Active ROM Right eval Right 11/23/22 Right  11/30/22 Right 12/07/22 Right 12/15/22 Right 12/18/22  Hip flexion        Hip extension        Hip abduction        Hip adduction        Hip internal rotation        Hip external rotation        Knee flexion 64*  supine  Seated P: 81* Seated 64-71* PROM and AAROM Seated A: 71* P: 82* Supine  A: 63*, AAROM in sitting 62*, passive 74* Seated AROM in knee flexion stretch 83*  Knee extension 16* supine with heel prop  Supine With heel prop P: -14* Seated with foot supported on stool 9* PROM Supine  A: Quad set -7* Supine with towel roll under heel 22*, seated PROM 12*   Ankle dorsiflexion        Ankle plantarflexion        Ankle inversion        Ankle eversion         (Blank rows = not tested)  LOWER EXTREMITY MMT:  MMT Right eval Left eval Right 12/15/22 Left 12/15/22  Hip flexion 3+ 4+ 4+  4+  Hip extension      Hip abduction      Hip adduction      Hip internal rotation      Hip external rotation      Knee flexion 4 4 4- 4-  Knee extension 2 4 4 5   Ankle dorsiflexion 5 5    Ankle plantarflexion      Ankle inversion      Ankle eversion       (Blank rows = not tested)    FUNCTIONAL TESTS:  5 times sit to stand: 43 seconds no UEs; 12/15/22- 15.5 seconds no UEs, severe offshift from R LE/compensations Timed up and go (TUG): 19.5 seconds SPC; 12/15/22- 14.7 seconds SPC   3 minute walk test: 325ft SPC; 12/15/22- 587ft SPC    GAIT: Distance walked: 328ft Assistive device utilized: Single point cane Level of assistance: Modified independence Comments: slow but steady, able to make sharp turns well at each end of walking course, used SPC on wrong side, foot flat and limited heel-toe pattern, limited R knee flexion/extension in stance/swing phases    TODAY'S TREATMENT:                                                                                                                              DATE:   12/18/22  TherEx  Scifit bike seat 11 x6 minutes full rotations Knee flexion stretch  on 8 inch box x15 with 5 second holds Standing quad stretch on 8 inch box 3x30 seconds  Knee flexion stretch seated in chair 3x60 seconds: 73* first round, 79* second round, 83* third round  HS curls red  TB x15 with 5 second holds LAQs 3# x15 with 3 second holds slow eccentric lower  Shuttle LE press: DL 81# 3 second stretch in extension and 3 second stretch in flexion x15; SL press 37# R LE only x15 in available ROM    12/17/22  Scifit bike seat 11 x6 minutes full rotations  SAQs 3# 2x15 with ice pack on knee Knee extension stretch with ice pack on knee and heel prop x3 minutes supine  Lots of time spent today explaining and redirecting patient    12/15/22  FOTO and objectives for progress note  FOTO 41 (decrease of 14 points)  Extensive education on progress in PT, also specific education on importance of doing HEP for TKR specifically instead of just riding bike and doing steps/select exercises at home. Seeing Dr. Magnus Maldonado in 2 days, general education regarding potential mention of manipulation given limited progress in terms of ROM.   Scifit bike x8 minutes seat 10 full rotations BUEs/LEs- able to get full rotations without severe compensations, refused to allow PT to progress bike seat/ROM of knee on bike    12/09/2022: Therapeutic Exercise Scifit bike seat 10 x  5 minutes with BLEs & BUEs then 3 min with BLEs only, full rotations level 1 Leg press BLEs 81# 10 reps 2  sets; RLE only 37# 10 reps Hamstring stretch passive 30 sec hold 2 reps Gastroc stretch step heel depression 30 sec hold 2 reps  Heel raises BLEs 10 reps with right UE support Step up RLE and step down LLE (RLE eccentric) 6" step BUE support //bars 5 reps. PT demo & verbal cues on technique.  Slider to 3 cones (ant-lat, lat & Maldonado-lat) with stance LE knee flexion on outward motion and extension on inward motion. 3 reps ea LE.   Functional squat to pick up items from floor 5 reps with supervision.   Gait Training: Pt amb 50-75' X 3 within clinic with cane with supervision  Manual PROM with overpressure & Hypervolt to quad for knee flexion seated PROM with overpressure & quad set for knee ext supine  LLE dual  hose for ankle & knee medium compression 34* 5 min only (pt request) with elevation  12/07/2022: Therapeutic Exercise Scifit bike seat 10 x  6 minutes, full rotations level 1 Leg press BLEs 81# 10 reps 2 sets; RLE only 31# 10 reps Hamstring stretch passive 30 sec hold 2 reps Tandem stance on foam beam RLE in front & in back 30 sec with single touch ea  Gastroc stretch step heel depression 30 sec hold 2 reps  Heel raises BLEs 10 reps with light UE support SAQs 5# 10 reps 2 sets with 2 second hold Seated LAQ & active knee flexion with contralateral LE opposing motion 10 reps 2 sets. LAQ without LLE knee flexion -14* and with LLE knee flexion -9*  Gait Training: Pt amb 50-75' X 3 within clinic with cane with supervision Pt appears safe to initiate cane use in home where distances are short & no barriers. Pt verbalized understanding  Manual PROM with overpressure & Hypervolt to quad for knee flexion seated PROM with overpressure & quad set for knee ext supine Scar mobs  LLE knee medium compression 34* 5 min only (pt request) with elevation   12/04/22  TherEx  Scifit bike seat 10 x6 minutes, partial rotations  LAQs 3# x15 with 2 second hold HS curls red TB x15 Self knee flexion AAROM x15 with 5 second holds R LE Knee flexion holds 3x30 seconds edge of mat table Knee extension stretch x4 minutes 3#   Manual  Retrograde massage for edema management LE elevated     HOME EXERCISE PROGRAM: Access Code: [email or web address] URL: [email or web address] Date: 11/23/2022 Prepared by: Vladimir Faster  Exercises - Ankle Alphabet in Elevation  - 2-4 x daily - 7 x weekly - 1 sets - 1 reps - Supine Single Leg Ankle Pumps  - 1 x daily - 5 x weekly - 1 sets - 10 reps - 5 seconds hold - Quad Setting and Stretching  - 2-4 x daily - 7 x weekly - 5-10 sets - 10 reps - 5 sec hold - Supine Heel Slide with Strap  - 2-3 x daily - 7 x weekly - 2-3 sets - 10 reps - 5 seconds hold - Supine Knee  Extension Strengthening  - 1 x daily - 7 x weekly - 2-3 sets - 10 reps - 5 seconds hold - Supine Straight Leg Raises  - 2-3 x daily - 7 x weekly - 2-3 sets - 10 reps - 5 seconds hold - Seated Knee Flexion Extension AROM   - 2-4 x daily - 7 x weekly - 2-3 sets - 10 reps - 5 seconds hold - Seated straight leg lifts  - 2-3 x daily -  7 x weekly - 2-3 sets - 10 reps - 5 seconds hold - Seated Hamstring Stretch with Strap  - 2-4 x daily - 7 x weekly - 1 sets - 3 reps - 20-30 seconds hold - Seated Long Arc Quad  - 1 x daily - 7 x weekly - 2-3 sets - 10 reps - 5 seconds hold - Heel raises near counter  - 1 x daily - 5 x weekly - 1 sets - 10 reps - 5 seconds hold - standing calf stretch with forefoot on small step or brick  - 1 x daily - 7 x weekly - 1 sets - 3 reps - 20-30 seconds hold - Standing Knee Flexion with Counter Support  - 1 x daily - 7 x weekly - 12-3 sets - 10 reps - 5 seconds hold - Standing March with Counter Support  - 1 x daily - 7 x weekly - 2-3 sets - 10 reps - 5 seconds hold - standing knee extension  - 1 x daily - 7 x weekly - 2-3 sets - 10 reps - 5 seconds hold   ASSESSMENT: CLINICAL IMPRESSION:  Ms. Spreen arrives today doing well, confirms that Dr. Magnus Maldonado is doing a manipulation, just waiting on his office to call with the surgery date. Continued focus on general ROM, also worked on strengthening as appropriate today. The new muscle relaxers seemed to help a bit, able to improve flexion ROM today. Asked her to kindly let us know as soon as she has her surgery date for the manipulation so we can get sessions booked in advance for aggressive PT following this.   OBJECTIVE IMPAIRMENTS: Abnormal gait, decreased activity tolerance, decreased balance, decreased coordination, decreased mobility, difficulty walking, decreased ROM, decreased strength, hypomobility, increased edema, increased fascial restrictions, impaired flexibility, impaired UE functional use, and pain.   ACTIVITY  LIMITATIONS: sitting, standing, squatting, stairs, transfers, bed mobility, and locomotion level  PARTICIPATION LIMITATIONS: cleaning, laundry, driving, shopping, community activity, occupation, and yard work  PERSONAL FACTORS: Age, Behavior pattern, Education, Fitness, Past/current experiences, Social background, and Time since onset of injury/illness/exacerbation are also affecting patient's functional outcome.   REHAB POTENTIAL: Good  CLINICAL DECISION MAKING: Stable/uncomplicated  EVALUATION COMPLEXITY: Low   GOALS: Goals reviewed with patient? Yes  SHORT TERM GOALS: Target date: 12/18/2022   Will be compliant with appropriate progressive HEP  Baseline: Goal status: 12/15/22- ONGOING limited compliance   2.  R knee AROM to be no more than 5 degrees extension and no less than 90 degrees flexion  Baseline:  Goal status: 12/15/22- ONGOING very limited ROM (see above)  3.  Will be independent with management techniques for localized edema including wearing compression stockings and appropriate ice routine  Baseline:  Goal status: 12/12/22- MET   4.  Gait pattern to have normalized and she will demonstrate steady gait with use of SPC on appropriate side/good technique with SPC  Baseline:  Goal status: 12/15/22- ONGOING, still has significant gait deviations due to limited ROM in surgical knee    LONG TERM GOALS: Target date: 01/15/2023    MMT to have improved by 1 grade in all weak muscles  Baseline:  Goal status: 12/15/22- ONGOING, improving but not at goal level   2.  R knee flexion AROM to be at least 105 degrees  Baseline:  Goal status: 12/15/22- ONGOING, severe limitations   3.  Will be able to ascend/descend steps with U rail and reciprocal pattern, no increase in pain  Baseline:  Goal  status: 12/15/22- ONGOING, did not test in clinic, reports step to pattern   4.  Will complete 5x sit to stand test in 20 seconds or less no UEs  Baseline:  Goal status: 12/15/22- MET  15.5 seconds no UEs   5.  Will complete TUG test in 13 seconds or less with LRAD to show improved functional balance Baseline:  Goal status: 12/15/22- ONGOING   6.  Will ambulate at least 589ft with LRAD in to show improved mobility and community access  Baseline:  Goal status: 12/15/22- MET    PLAN:  PT FREQUENCY:  3x/week for 4 weeks, then drop to 2x/week for 4 weeks   PT DURATION: 8 weeks  PLANNED INTERVENTIONS: Therapeutic exercises, Therapeutic activity, Neuromuscular re-education, Balance training, Gait training, Patient/Family education, Self Care, Joint mobilization, Stair training, Orthotic/Fit training, DME instructions, Aquatic Therapy, Dry Needling, Electrical stimulation, Cryotherapy, Moist heat, Taping, Vasopneumatic device, Ultrasound, Ionotophoresis 4mg /ml Dexamethasone, Manual therapy, and Re-evaluation  PLAN FOR NEXT SESSION:  aggressive ROM focus, continue to encourage HEP compliance.  Any updates on when her manipulation will be?    Nedra Hai PT DPT PN2

## 2022-12-22 ENCOUNTER — Encounter: Payer: Self-pay | Admitting: Physical Therapy

## 2022-12-22 ENCOUNTER — Ambulatory Visit (INDEPENDENT_AMBULATORY_CARE_PROVIDER_SITE_OTHER): Payer: Medicare Other | Admitting: Physical Therapy

## 2022-12-22 DIAGNOSIS — M25661 Stiffness of right knee, not elsewhere classified: Secondary | ICD-10-CM

## 2022-12-22 DIAGNOSIS — R262 Difficulty in walking, not elsewhere classified: Secondary | ICD-10-CM | POA: Diagnosis not present

## 2022-12-22 DIAGNOSIS — M6281 Muscle weakness (generalized): Secondary | ICD-10-CM

## 2022-12-22 DIAGNOSIS — R600 Localized edema: Secondary | ICD-10-CM

## 2022-12-22 DIAGNOSIS — M25561 Pain in right knee: Secondary | ICD-10-CM | POA: Diagnosis not present

## 2022-12-22 NOTE — Therapy (Signed)
OUTPATIENT PHYSICAL THERAPY LOWER EXTREMITY TREATMENT   Patient Name: Melissa Maldonado MRN: [id number] DOB:Jun 01, 1941, 82 y.o., female Today's Date: 12/22/2022     END OF SESSION:  PT End of Session - 12/22/22 1257     Visit Number 13    Number of Visits 21    Date for PT Re-Evaluation 01/15/23    Authorization Type MCR and BCBS State    Authorization Time Period 11/20/22 to 01/15/23    Progress Note Due on Visit 20    PT Start Time 1258    PT Stop Time 1352    PT Time Calculation (min) 54 min    Activity Tolerance Patient tolerated treatment well    Behavior During Therapy [facility] for tasks assessed/performed                      Past Medical History:  Diagnosis Date   Arthritis    Chronic back pain    lumbar stenosis   Dysrhythmia    Early cataracts, bilateral    GERD (gastroesophageal reflux disease)    occasionally but no meds required   History of blood transfusion    History of colon polyps    Hyperlipidemia    takes Lipitor every other day   Hypertension    takes losartan daily   Neuropathy    Osteoarthritis of left knee 04/27/2014   Restless leg    Shortness of breath    with exertion   SOB (shortness of breath) 2013   CPET   Tear of medial meniscus of left knee 09/08/2013   Past Surgical History:  Procedure Laterality Date   APPENDECTOMY     BACK SURGERY  07/20/2008   lumb fusion   COLONOSCOPY  2018   EYE SURGERY     both cataracts   KNEE ARTHROSCOPY WITH MEDIAL MENISECTOMY Left 09/08/2013   Procedure: LEFT KNEE ARTHROSCOPY WITH PARTIAL MEDIAL MENISCECTOMY;  Surgeon: Melissa Post, MD;  Location: [facility];  Service: Orthopedics;  Laterality: Left;   LUMBAR LAMINECTOMY/DECOMPRESSION MICRODISCECTOMY  05/13/2012   Procedure: LUMBAR LAMINECTOMY/DECOMPRESSION MICRODISCECTOMY 1 LEVEL;  Surgeon: Melissa Abu, MD;  Location: [facility];  Service: Neurosurgery;  Laterality: Bilateral;  Bilateral Lumbar one -two Decompressive  Laminectomy   PARTIAL KNEE ARTHROPLASTY Left 04/27/2014   Procedure: LEFT UNICOMPARTMENTAL KNEE;  Surgeon: Melissa Post, MD;  Location: [facility];  Service: Orthopedics;  Laterality: Left;   TOTAL KNEE ARTHROPLASTY Right 11/06/2022   Procedure: RIGHT TOTAL KNEE ARTHROPLASTY;  Surgeon: Melissa Hitch, MD;  Location: [facility];  Service: Orthopedics;  Laterality: Right;   TUBAL LIGATION  1974   UPPER GI ENDOSCOPY     Patient Active Problem List   Diagnosis Date Noted   Status Maldonado total right knee replacement 11/06/2022   Arthritis of right acromioclavicular joint 08/28/2021   Impingement syndrome of right shoulder 08/19/2021   Nontraumatic incomplete tear of right rotator cuff 08/19/2021   Paresthesia 12/05/2020   Low back pain with right-sided sciatica 12/05/2020   Trochanteric bursitis, right hip 04/24/2020   Essential hypertension 01/12/2020   Sinus bradycardia 01/12/2020   Dyspnea on exertion 01/12/2020   Lumbar stenosis with neurogenic claudication 02/01/2017   Osteoarthritis of left knee 04/27/2014    PCP: Melissa Brunette MD   REFERRING PROVIDER: Kathryne Hitch, MD  REFERRING DIAG: [phone] (ICD-10-CM) - Status Maldonado total right knee replacement M17.11 (ICD-10-CM) - Unilateral primary osteoarthritis, right knee  THERAPY DIAG:  Stiffness of  right knee, not elsewhere classified  Muscle weakness (generalized)  Acute pain of right knee  Localized edema  Difficulty in walking, not elsewhere classified  Rationale for Evaluation and Treatment: Rehabilitation  ONSET DATE: 11/09/2022  SUBJECTIVE:   SUBJECTIVE STATEMENT: She has manipulation surgery Thursday morning at 630am.    PERTINENT HISTORY: Hospital Course: Melissa Maldonado is an 82 y.o. female who was admitted 11/06/2022 for operative treatment ofUnilateral primary osteoarthritis, right knee. Patient has severe unremitting pain that affects sleep, daily activities, and work/hobbies.  After pre-op clearance the patient was taken to the operating room on 11/06/2022 and underwent  Procedure(s): RIGHT TOTAL KNEE ARTHROPLASTY.   PMH: OA, right rotator cuff tear, LBP w/ sciatica, lumbar stenosis, back surgery lumbar fusion & discectomy,  right hip bursitis, HEN, bradycardia,    PAIN:  Are you having pain?  Yes: NPRS scale: 4/10 and in last week ranging 2/10 - 8/10 Pain location: R knee  Pain description: sharp  Aggravating factors: doing too much the knee  Relieving factors: unsure, maybe ice   PRECAUTIONS: None  WEIGHT BEARING RESTRICTIONS: No  FALLS:  Has patient fallen in last 6 months? No  LIVING ENVIRONMENT: Lives with: lives with their spouse Lives in: House/apartment Stairs: 4 STE B rails, split level home with 6 steps up/6 steps down U rail  Has following equipment at home: Single point cane, Walker - 2 wheeled, and toilet raiser   OCCUPATION: retired   PLOF: Independent, Independent with basic ADLs, Independent with gait, and Independent with transfers  PATIENT GOALS: be able to walk without pain  NEXT MD VISIT: manipulation 12/24/2022  OBJECTIVE:   DIAGNOSTIC FINDINGS:   PATIENT SURVEYS:  FOTO 55, predicted 74  COGNITION: Overall cognitive status:  possible STM impairment, family not present to confirm    SENSATION: Not tested  EDEMA:   Some excessive pitting edema in surgical calf, no temperature or color changes, Homan's negative; on daily Xarelto per her report and per chart, she tells me she has been compliant with this medication   LOWER EXTREMITY ROM:  Active ROM Right eval Right 11/23/22 Right  11/30/22 Right 12/07/22 Right 12/15/22 Right 12/18/22  Hip flexion        Hip extension        Hip abduction        Hip adduction        Hip internal rotation        Hip external rotation        Knee flexion 64* supine  Seated P: 81* Seated 64-71* PROM and AAROM Seated A: 71* P: 82* Supine  A: 63*, AAROM in sitting 62*, passive 74*  Seated AROM in knee flexion stretch 83*  Knee extension 16* supine with heel prop  Supine With heel prop P: -14* Seated with foot supported on stool 9* PROM Supine  A: Quad set -7* Supine with towel roll under heel 22*, seated PROM 12*   Ankle dorsiflexion        Ankle plantarflexion        Ankle inversion        Ankle eversion         (Blank rows = not tested)  LOWER EXTREMITY MMT:  MMT Right eval Left eval Right 12/15/22 Left 12/15/22  Hip flexion 3+ 4+ 4+ 4+  Hip extension      Hip abduction      Hip adduction      Hip internal rotation      Hip external  rotation      Knee flexion 4 4 4- 4-  Knee extension 2 4 4 5   Ankle dorsiflexion 5 5    Ankle plantarflexion      Ankle inversion      Ankle eversion       (Blank rows = not tested)    FUNCTIONAL TESTS:  5 times sit to stand: 43 seconds no UEs; 12/15/22- 15.5 seconds no UEs, severe offshift from R LE/compensations Timed up and go (TUG): 19.5 seconds SPC; 12/15/22- 14.7 seconds SPC   3 minute walk test: 394ft SPC; 12/15/22- 544ft SPC    GAIT: Distance walked: 349ft Assistive device utilized: Single point cane Level of assistance: Modified independence Comments: slow but steady, able to make sharp turns well at each end of walking course, used SPC on wrong side, foot flat and limited heel-toe pattern, limited R knee flexion/extension in stance/swing phases    TODAY'S TREATMENT:                                                                                                                              DATE:  12/22/2022: TherEx Scifit bike seat 11 BLEs &  BUEs level 1 for 8 minutes full rotations Right Knee flexion stretch  on chair seat 10 second holds 3 reps 3 sets moving LLE closer to chair each set  Prone hand 2.5# 1 min 3 sets HS curls prone 2.5# 10 reps 2 sets LAQs & active knee flexion with PT assist end range. Opposing motion contralateral LE.  Sit to/from stand using knees with PT demo & verbal cues on technique  10 reps mat table set at 18".  Bridge 5 sec hold moving Rt foot closer on up position rep 3 & 5 for stretch. Pt only able to tol 5 reps due to back pain. Supine SAQ 2.5# 10 reps 2 sets  Manual therapy: Hypervolt to quads, seated knee flexion with traction tibial int rot and contract relax. Knee ext mob RLE long sit position  Vaso Right knee medium compression 34*  6 min (pt request)   12/18/22  TherEx  Scifit bike seat 11 x6 minutes full rotations Knee flexion stretch  on 8 inch box x15 with 5 second holds Standing quad stretch on 8 inch box 3x30 seconds  Knee flexion stretch seated in chair 3x60 seconds: 73* first round, 79* second round, 83* third round  HS curls red TB x15 with 5 second holds LAQs 3# x15 with 3 second holds slow eccentric lower  Shuttle LE press: DL 13# 3 second stretch in extension and 3 second stretch in flexion x15; SL press 37# R LE only x15 in available ROM    12/17/22  Scifit bike seat 11 x6 minutes full rotations  SAQs 3# 2x15 with ice pack on knee Knee extension stretch with ice pack on knee and heel prop x3 minutes supine  Lots of time spent today explaining and redirecting patient  HOME EXERCISE PROGRAM: Access Code: [email or web address] URL: [email or web address] Date: 11/23/2022 Prepared by: Vladimir Faster  Exercises - Ankle Alphabet in Elevation  - 2-4 x daily - 7 x weekly - 1 sets - 1 reps - Supine Single Leg Ankle Pumps  - 1 x daily - 5 x weekly - 1 sets - 10 reps - 5 seconds hold - Quad Setting and Stretching  - 2-4 x daily - 7 x weekly - 5-10 sets - 10 reps - 5 sec hold - Supine Heel Slide with Strap  - 2-3 x daily - 7 x weekly - 2-3 sets - 10 reps - 5 seconds hold - Supine Knee Extension Strengthening  - 1 x daily - 7 x weekly - 2-3 sets - 10 reps - 5 seconds hold - Supine Straight Leg Raises  - 2-3 x daily - 7 x weekly - 2-3 sets - 10 reps - 5 seconds hold - Seated Knee Flexion Extension AROM   - 2-4 x daily - 7 x weekly - 2-3  sets - 10 reps - 5 seconds hold - Seated straight leg lifts  - 2-3 x daily - 7 x weekly - 2-3 sets - 10 reps - 5 seconds hold - Seated Hamstring Stretch with Strap  - 2-4 x daily - 7 x weekly - 1 sets - 3 reps - 20-30 seconds hold - Seated Long Arc Quad  - 1 x daily - 7 x weekly - 2-3 sets - 10 reps - 5 seconds hold - Heel raises near counter  - 1 x daily - 5 x weekly - 1 sets - 10 reps - 5 seconds hold - standing calf stretch with forefoot on small step or brick  - 1 x daily - 7 x weekly - 1 sets - 3 reps - 20-30 seconds hold - Standing Knee Flexion with Counter Support  - 1 x daily - 7 x weekly - 12-3 sets - 10 reps - 5 seconds hold - Standing March with Counter Support  - 1 x daily - 7 x weekly - 2-3 sets - 10 reps - 5 seconds hold - standing knee extension  - 1 x daily - 7 x weekly - 2-3 sets - 10 reps - 5 seconds hold   ASSESSMENT: CLINICAL IMPRESSION: Patient continues to struggle with range limited by pain.  End feel appears guarding & soft tissue tightness.  Patient should improve range after manipulation under anesthesia. She will need daily PT Mon-Fri for 2 weeks.   OBJECTIVE IMPAIRMENTS: Abnormal gait, decreased activity tolerance, decreased balance, decreased coordination, decreased mobility, difficulty walking, decreased ROM, decreased strength, hypomobility, increased edema, increased fascial restrictions, impaired flexibility, impaired UE functional use, and pain.   ACTIVITY LIMITATIONS: sitting, standing, squatting, stairs, transfers, bed mobility, and locomotion level  PARTICIPATION LIMITATIONS: cleaning, laundry, driving, shopping, community activity, occupation, and yard work  PERSONAL FACTORS: Age, Behavior pattern, Education, Fitness, Past/current experiences, Social background, and Time since onset of injury/illness/exacerbation are also affecting patient's functional outcome.   REHAB POTENTIAL: Good  CLINICAL DECISION MAKING: Stable/uncomplicated  EVALUATION  COMPLEXITY: Low   GOALS: Goals reviewed with patient? Yes  SHORT TERM GOALS: Target date: 12/18/2022   Will be compliant with appropriate progressive HEP  Baseline: Goal status: 12/15/22- ONGOING limited compliance   2.  R knee AROM to be no more than 5 degrees extension and no less than 90 degrees flexion  Baseline:  Goal status: 12/15/22- ONGOING very limited ROM (see above)  3.  Will be independent  with management techniques for localized edema including wearing compression stockings and appropriate ice routine  Baseline:  Goal status: 12/12/22- MET   4.  Gait pattern to have normalized and she will demonstrate steady gait with use of SPC on appropriate side/good technique with SPC  Baseline:  Goal status: 12/15/22- ONGOING, still has significant gait deviations due to limited ROM in surgical knee    LONG TERM GOALS: Target date: 01/15/2023    MMT to have improved by 1 grade in all weak muscles  Baseline:  Goal status: 12/15/22- ONGOING, improving but not at goal level   2.  R knee flexion AROM to be at least 105 degrees  Baseline:  Goal status: 12/15/22- ONGOING, severe limitations   3.  Will be able to ascend/descend steps with U rail and reciprocal pattern, no increase in pain  Baseline:  Goal status: 12/15/22- ONGOING, did not test in clinic, reports step to pattern   4.  Will complete 5x sit to stand test in 20 seconds or less no UEs  Baseline:  Goal status: 12/15/22- MET 15.5 seconds no UEs   5.  Will complete TUG test in 13 seconds or less with LRAD to show improved functional balance Baseline:  Goal status: 12/15/22- ONGOING   6.  Will ambulate at least 563ft with LRAD in to show improved mobility and community access  Baseline:  Goal status: 12/15/22- MET    PLAN:  PT FREQUENCY:  3x/week for 4 weeks, then drop to 2x/week for 4 weeks   PT DURATION: 8 weeks  PLANNED INTERVENTIONS: Therapeutic exercises, Therapeutic activity, Neuromuscular re-education,  Balance training, Gait training, Patient/Family education, Self Care, Joint mobilization, Stair training, Orthotic/Fit training, DME instructions, Aquatic Therapy, Dry Needling, Electrical stimulation, Cryotherapy, Moist heat, Taping, Vasopneumatic device, Ultrasound, Ionotophoresis 4mg /ml Dexamethasone, Manual therapy, and Re-evaluation  PLAN FOR NEXT SESSION: check range Maldonado manipulation, do recert to cover increased frequency, manual therapy & exercise for range.   Vladimir Faster, PT, DPT 12/22/2022, 2:10 PM

## 2022-12-24 ENCOUNTER — Ambulatory Visit (INDEPENDENT_AMBULATORY_CARE_PROVIDER_SITE_OTHER): Payer: Medicare Other | Admitting: Physical Therapy

## 2022-12-24 ENCOUNTER — Encounter: Payer: Self-pay | Admitting: Physical Therapy

## 2022-12-24 DIAGNOSIS — M6281 Muscle weakness (generalized): Secondary | ICD-10-CM | POA: Diagnosis not present

## 2022-12-24 DIAGNOSIS — M25561 Pain in right knee: Secondary | ICD-10-CM

## 2022-12-24 DIAGNOSIS — R262 Difficulty in walking, not elsewhere classified: Secondary | ICD-10-CM | POA: Diagnosis not present

## 2022-12-24 DIAGNOSIS — T8482XA Fibrosis due to internal orthopedic prosthetic devices, implants and grafts, initial encounter: Secondary | ICD-10-CM | POA: Diagnosis not present

## 2022-12-24 DIAGNOSIS — Z96651 Presence of right artificial knee joint: Secondary | ICD-10-CM | POA: Diagnosis not present

## 2022-12-24 DIAGNOSIS — M24661 Ankylosis, right knee: Secondary | ICD-10-CM | POA: Diagnosis not present

## 2022-12-24 DIAGNOSIS — G8918 Other acute postprocedural pain: Secondary | ICD-10-CM | POA: Diagnosis not present

## 2022-12-24 DIAGNOSIS — M25661 Stiffness of right knee, not elsewhere classified: Secondary | ICD-10-CM

## 2022-12-24 DIAGNOSIS — R600 Localized edema: Secondary | ICD-10-CM | POA: Diagnosis not present

## 2022-12-24 NOTE — Therapy (Signed)
OUTPATIENT PHYSICAL THERAPY LOWER EXTREMITY TREATMENT / RECERTIFICATION & PROGRESS NOTE   Patient Name: Melissa Maldonado MRN: [id number] DOB:1941-06-16, 82 y.o., female Today's Date: 12/24/2022  Progress Note Reporting Period 12/15/2022 to 12/24/2022  See note below for Objective Data and Assessment of Progress/Goals.      END OF SESSION:  PT End of Session - 12/24/22 1258     Visit Number 14    Number of Visits 40    Date for PT Re-Evaluation 02/04/23    Authorization Type MCR and BCBS State    Progress Note Due on Visit 24    PT Start Time 1259    PT Stop Time 1353    PT Time Calculation (min) 54 min    Activity Tolerance Patient tolerated treatment well;Patient limited by pain    Behavior During Therapy WFL for tasks assessed/performed                      Past Medical History:  Diagnosis Date   Arthritis    Chronic back pain    lumbar stenosis   Dysrhythmia    Early cataracts, bilateral    GERD (gastroesophageal reflux disease)    occasionally but no meds required   History of blood transfusion    History of colon polyps    Hyperlipidemia    takes Lipitor every other day   Hypertension    takes losartan daily   Neuropathy    Osteoarthritis of left knee 04/27/2014   Restless leg    Shortness of breath    with exertion   SOB (shortness of breath) 2013   CPET   Tear of medial meniscus of left knee 09/08/2013   Past Surgical History:  Procedure Laterality Date   APPENDECTOMY     BACK SURGERY  07/20/2008   lumb fusion   COLONOSCOPY  2018   EYE SURGERY     both cataracts   KNEE ARTHROSCOPY WITH MEDIAL MENISECTOMY Left 09/08/2013   Procedure: LEFT KNEE ARTHROSCOPY WITH PARTIAL MEDIAL MENISCECTOMY;  Surgeon: Melissa Post, MD;  Location: [facility];  Service: Orthopedics;  Laterality: Left;   LUMBAR LAMINECTOMY/DECOMPRESSION MICRODISCECTOMY  05/13/2012   Procedure: LUMBAR LAMINECTOMY/DECOMPRESSION MICRODISCECTOMY 1 LEVEL;  Surgeon:  Melissa Abu, MD;  Location: [facility];  Service: Neurosurgery;  Laterality: Bilateral;  Bilateral Lumbar one -two Decompressive Laminectomy   PARTIAL KNEE ARTHROPLASTY Left 04/27/2014   Procedure: LEFT UNICOMPARTMENTAL KNEE;  Surgeon: Melissa Post, MD;  Location: [facility];  Service: Orthopedics;  Laterality: Left;   TOTAL KNEE ARTHROPLASTY Right 11/06/2022   Procedure: RIGHT TOTAL KNEE ARTHROPLASTY;  Surgeon: Melissa Hitch, MD;  Location: [facility];  Service: Orthopedics;  Laterality: Right;   TUBAL LIGATION  1974   UPPER GI ENDOSCOPY     Patient Active Problem List   Diagnosis Date Noted   Status Maldonado total right knee replacement 11/06/2022   Arthritis of right acromioclavicular joint 08/28/2021   Impingement syndrome of right shoulder 08/19/2021   Nontraumatic incomplete tear of right rotator cuff 08/19/2021   Paresthesia 12/05/2020   Low back pain with right-sided sciatica 12/05/2020   Trochanteric bursitis, right hip 04/24/2020   Essential hypertension 01/12/2020   Sinus bradycardia 01/12/2020   Dyspnea on exertion 01/12/2020   Lumbar stenosis with neurogenic claudication 02/01/2017   Osteoarthritis of left knee 04/27/2014    PCP: Melissa Brunette MD   REFERRING PROVIDER: Kathryne Hitch, MD  REFERRING DIAG: [phone] (ICD-10-CM) - Status  Maldonado total right knee replacement M17.11 (ICD-10-CM) - Unilateral primary osteoarthritis, right knee  THERAPY DIAG:  Stiffness of right knee, not elsewhere classified  Muscle weakness (generalized)  Acute pain of right knee  Localized edema  Difficulty in walking, not elsewhere classified  Rationale for Evaluation and Treatment: Rehabilitation  ONSET DATE: 11/09/2022  SUBJECTIVE:   SUBJECTIVE STATEMENT: She had manipulation this morning.  She has picture with knee flexed to 120*.    PERTINENT HISTORY: Hospital Course: Melissa Maldonado is an 82 y.o. female who was admitted 11/06/2022 for operative  treatment ofUnilateral primary osteoarthritis, right knee. Patient has severe unremitting pain that affects sleep, daily activities, and work/hobbies. After pre-op clearance the patient was taken to the operating room on 11/06/2022 and underwent  Procedure(s): RIGHT TOTAL KNEE ARTHROPLASTY.   PMH: OA, right rotator cuff tear, LBP w/ sciatica, lumbar stenosis, back surgery lumbar fusion & discectomy,  right hip bursitis, HEN, bradycardia,    PAIN:  Are you having pain?  Yes: NPRS scale:  4/10 took pain meds today & since procedure ranging 4/10 - 8/10 Pain location: R knee  Pain description: sharp  Aggravating factors: doing too much the knee  Relieving factors: unsure, maybe ice   PRECAUTIONS: None  WEIGHT BEARING RESTRICTIONS: No  FALLS:  Has patient fallen in last 6 months? No  LIVING ENVIRONMENT: Lives with: lives with their spouse Lives in: House/apartment Stairs: 4 STE B rails, split level home with 6 steps up/6 steps down U rail  Has following equipment at home: Single point cane, Walker - 2 wheeled, and toilet raiser   OCCUPATION: retired   PLOF: Independent, Independent with basic ADLs, Independent with gait, and Independent with transfers  PATIENT GOALS: be able to walk without pain  NEXT MD VISIT: manipulation 12/24/2022  OBJECTIVE:   DIAGNOSTIC FINDINGS:   PATIENT SURVEYS:  FOTO 55, predicted 57  COGNITION: Overall cognitive status:  possible STM impairment, family not present to confirm    SENSATION: Not tested  EDEMA:   Some excessive pitting edema in surgical calf, no temperature or color changes, Homan's negative; on daily Xarelto per her report and per chart, she tells me she has been compliant with this medication   LOWER EXTREMITY ROM:  Active ROM Right eval Right 11/23/22 Right  11/30/22 Right 12/07/22 Right 12/15/22 Right 12/18/22 Right 12/24/22 S/p manipulation  Hip flexion         Hip extension         Hip abduction         Hip adduction          Hip internal rotation         Hip external rotation         Knee flexion 64* supine  Seated P: 81* Seated 64-71* PROM and AAROM Seated A: 71* P: 82* Supine  A: 63*, AAROM in sitting 62*, passive 74* Seated AROM in knee flexion stretch 83* Supine P: 94*   Knee extension 16* supine with heel prop  Supine With heel prop P: -14* Seated with foot supported on stool 9* PROM Supine  A: Quad set -7* Supine with towel roll under heel 22*, seated PROM 12*  Supine P: -9*  Ankle dorsiflexion         Ankle plantarflexion         Ankle inversion         Ankle eversion          (Blank rows = not tested)  LOWER EXTREMITY  MMT:  MMT Right eval Left eval Right 12/15/22 Left 12/15/22  Hip flexion 3+ 4+ 4+ 4+  Hip extension      Hip abduction      Hip adduction      Hip internal rotation      Hip external rotation      Knee flexion 4 4 4- 4-  Knee extension 2 4 4 5   Ankle dorsiflexion 5 5    Ankle plantarflexion      Ankle inversion      Ankle eversion       (Blank rows = not tested)    FUNCTIONAL TESTS:  5 times sit to stand: 43 seconds no UEs; 12/15/22- 15.5 seconds no UEs, severe offshift from R LE/compensations Timed up and go (TUG): 19.5 seconds SPC; 12/15/22- 14.7 seconds SPC   3 minute walk test: 356ft SPC; 12/15/22- 560ft SPC    GAIT: 12/23/2022: pt amb with cane with antalgic pattern with knee flexion in stance and minimal increase flexion for swing.   11/20/2022 / Evaluation: Distance walked: 359ft Assistive device utilized: Single point cane Level of assistance: Modified independence Comments: slow but steady, able to make sharp turns well at each end of walking course, used SPC on wrong side, foot flat and limited heel-toe pattern, limited R knee flexion/extension in stance/swing phases    TODAY'S TREATMENT:                                                                                                                              DATE:  12/24/2022: Manual Therapy: Seated &  supine PROM with overpressure for knee flexion & extension. Soft tissue manual & Hypervolt to quad  Soft tissue mob to medial hamstring during ext stretch  Therapeutic Exercise: Heel slide supine with RLE on red therapy ball with strap & PT AA with 5 sec hold flexion & ext stretch 10 reps. Quad stretch supine hooklying knee flexion with strap & PT assist 30 sec hold 5 reps.  Soft tissue manual therapy during stretch LAQ & active knee flexion supine hooklying with RLE over edge of bed AA for greater range 10 reps  Supine quad set 5 sec hold 10 reps with PT cues for neutral hip rotation Supine SAQ 10 reps Supine heel slide with PT end range assist 5 sec hold. PT emphasized need to move knee frequently during awake hours. Pt verbalized understanding.   Vaso RLE dual hose for knee & ankle due to swelling s/p manipulation medium compression 34*  10 min     12/22/2022: TherEx Scifit bike seat 11 BLEs &  BUEs level 1 for 8 minutes full rotations Right Knee flexion stretch  on chair seat 10 second holds 3 reps 3 sets moving LLE closer to chair each set  Prone hand 2.5# 1 min 3 sets HS curls prone 2.5# 10 reps 2 sets LAQs & active knee flexion with PT assist end range. Opposing motion contralateral LE.  Sit to/from stand using knees with PT demo & verbal cues on technique 10 reps mat table set at 18".  Bridge 5 sec hold moving Rt foot closer on up position rep 3 & 5 for stretch. Pt only able to tol 5 reps due to back pain. Supine SAQ 2.5# 10 reps 2 sets  Manual therapy: Hypervolt to quads, seated knee flexion with traction tibial int rot and contract relax. Knee ext mob RLE long sit position  Vaso Right knee medium compression 34*  6 min (pt request)   12/18/22  TherEx  Scifit bike seat 11 x6 minutes full rotations Knee flexion stretch  on 8 inch box x15 with 5 second holds Standing quad stretch on 8 inch box 3x30 seconds  Knee flexion stretch seated in chair 3x60 seconds: 73* first  round, 79* second round, 83* third round  HS curls red TB x15 with 5 second holds LAQs 3# x15 with 3 second holds slow eccentric lower  Shuttle LE press: DL 95# 3 second stretch in extension and 3 second stretch in flexion x15; SL press 37# R LE only x15 in available ROM     HOME EXERCISE PROGRAM: Access Code: [email or web address] URL: [email or web address] Date: 11/23/2022 Prepared by: Vladimir Faster  Exercises - Ankle Alphabet in Elevation  - 2-4 x daily - 7 x weekly - 1 sets - 1 reps - Supine Single Leg Ankle Pumps  - 1 x daily - 5 x weekly - 1 sets - 10 reps - 5 seconds hold - Quad Setting and Stretching  - 2-4 x daily - 7 x weekly - 5-10 sets - 10 reps - 5 sec hold - Supine Heel Slide with Strap  - 2-3 x daily - 7 x weekly - 2-3 sets - 10 reps - 5 seconds hold - Supine Knee Extension Strengthening  - 1 x daily - 7 x weekly - 2-3 sets - 10 reps - 5 seconds hold - Supine Straight Leg Raises  - 2-3 x daily - 7 x weekly - 2-3 sets - 10 reps - 5 seconds hold - Seated Knee Flexion Extension AROM   - 2-4 x daily - 7 x weekly - 2-3 sets - 10 reps - 5 seconds hold - Seated straight leg lifts  - 2-3 x daily - 7 x weekly - 2-3 sets - 10 reps - 5 seconds hold - Seated Hamstring Stretch with Strap  - 2-4 x daily - 7 x weekly - 1 sets - 3 reps - 20-30 seconds hold - Seated Long Arc Quad  - 1 x daily - 7 x weekly - 2-3 sets - 10 reps - 5 seconds hold - Heel raises near counter  - 1 x daily - 5 x weekly - 1 sets - 10 reps - 5 seconds hold - standing calf stretch with forefoot on small step or brick  - 1 x daily - 7 x weekly - 1 sets - 3 reps - 20-30 seconds hold - Standing Knee Flexion with Counter Support  - 1 x daily - 7 x weekly - 12-3 sets - 10 reps - 5 seconds hold - Standing March with Counter Support  - 1 x daily - 7 x weekly - 2-3 sets - 10 reps - 5 seconds hold - standing knee extension  - 1 x daily - 7 x weekly - 2-3 sets - 10 reps - 5 seconds hold   ASSESSMENT: CLINICAL  IMPRESSION: Patient had increased pain including in muscles after manipulation this  morning.  She had increased PROM compared to previous PT sessions.  Patient would benefit from daily PT Mon-Fri for 2 weeks Maldonado manipulation.  Patient continues to benefit from skilled PT to maximize function following TKA & manipulation under anesthesia.  OBJECTIVE IMPAIRMENTS: Abnormal gait, decreased activity tolerance, decreased balance, decreased coordination, decreased mobility, difficulty walking, decreased ROM, decreased strength, hypomobility, increased edema, increased fascial restrictions, impaired flexibility, impaired UE functional use, and pain.   ACTIVITY LIMITATIONS: sitting, standing, squatting, stairs, transfers, bed mobility, and locomotion level  PARTICIPATION LIMITATIONS: cleaning, laundry, driving, shopping, community activity, occupation, and yard work  PERSONAL FACTORS: Age, Behavior pattern, Education, Fitness, Past/current experiences, Social background, and Time since onset of injury/illness/exacerbation are also affecting patient's functional outcome.   REHAB POTENTIAL: Good  CLINICAL DECISION MAKING: Stable/uncomplicated  EVALUATION COMPLEXITY: Low   GOALS: Goals reviewed with patient? Yes  SHORT TERM GOALS: Target date: 01/15/2023   Will be compliant with appropriate progressive HEP  Baseline: Goal status: ongoing 12/23/2022  2.  R knee PROM ext -5* and flexion 100* Baseline:  Goal status: ongoing 12/23/2022  3.  Will be independent with management techniques for localized edema including wearing compression stockings and appropriate ice routine  Baseline:  Goal status: ongoing 12/23/2022   4.  Patient ambulates with cane with knee ext in stance & flexion in swing within 75% of normal range. Baseline:  Goal status: ongoing 12/23/2022   LONG TERM GOALS: Target date: 02/04/2023    MMT right knee >80% of LLE with HH dynameter Baseline:  Goal status: ongoing 12/23/2022  2.   R knee flexion AROM to be at least 105 degrees  Baseline:  Goal status: ongoing 12/23/2022  3.  Will be able to ascend/descend steps with single rail and reciprocal pattern, no increase in pain  Baseline:  Goal status: ongoing 12/23/2022  4.  Will complete 5x sit to stand test in 15 seconds or less no UEs  Baseline:  Goal status: ongoing 12/23/2022  5.  Will complete TUG test in 13 seconds or less with LRAD to show improved functional balance Baseline:  Goal status: ongoing 12/23/2022  6.  Will ambulate > 575ft with cane & neg ramp / curb modified independent to show improved mobility and community access  Baseline:  Goal status:ongoing 12/23/2022   PLAN:  PT FREQUENCY: 2-5x/wk (daily when clinic open for 2 weeks Maldonado manipulation)  PT DURATION: 6 weeks  PLANNED INTERVENTIONS: Therapeutic exercises, Therapeutic activity, Neuromuscular re-education, Balance training, Gait training, Patient/Family education, Self Care, Joint mobilization, Stair training, Orthotic/Fit training, DME instructions, Aquatic Therapy, Dry Needling, Electrical stimulation, Cryotherapy, Moist heat, Taping, Vasopneumatic device, Ultrasound, Ionotophoresis 4mg /ml Dexamethasone, Manual therapy, and Re-evaluation  PLAN FOR NEXT SESSION: Maldonado manipulation, manual therapy & exercise for range. Vaso for edema.   Vladimir Faster, PT, DPT 12/24/2022, 2:09 PM

## 2022-12-25 ENCOUNTER — Ambulatory Visit (INDEPENDENT_AMBULATORY_CARE_PROVIDER_SITE_OTHER): Payer: Medicare Other | Admitting: Rehabilitative and Restorative Service Providers"

## 2022-12-25 ENCOUNTER — Encounter: Payer: Self-pay | Admitting: Rehabilitative and Restorative Service Providers"

## 2022-12-25 DIAGNOSIS — M25661 Stiffness of right knee, not elsewhere classified: Secondary | ICD-10-CM

## 2022-12-25 DIAGNOSIS — R600 Localized edema: Secondary | ICD-10-CM

## 2022-12-25 DIAGNOSIS — M6281 Muscle weakness (generalized): Secondary | ICD-10-CM

## 2022-12-25 DIAGNOSIS — M25561 Pain in right knee: Secondary | ICD-10-CM | POA: Diagnosis not present

## 2022-12-25 DIAGNOSIS — R262 Difficulty in walking, not elsewhere classified: Secondary | ICD-10-CM

## 2022-12-25 NOTE — Therapy (Signed)
OUTPATIENT PHYSICAL THERAPY TREATMENT   Patient Name: Melissa Maldonado MRN: [id number] DOB:1941/01/15, 82 y.o., female Today's Date: 12/25/2022  END OF SESSION:  PT End of Session - 12/25/22 1302     Visit Number 15    Number of Visits 40    Date for PT Re-Evaluation 02/04/23    Authorization Type MCR and BCBS State - [place]    Progress Note Due on Visit 24    PT Start Time 1257    PT Stop Time 1346    PT Time Calculation (min) 49 min    Activity Tolerance Patient tolerated treatment well    Behavior During Therapy WFL for tasks assessed/performed                       Past Medical History:  Diagnosis Date   Arthritis    Chronic back pain    lumbar stenosis   Dysrhythmia    Early cataracts, bilateral    GERD (gastroesophageal reflux disease)    occasionally but no meds required   History of blood transfusion    History of colon polyps    Hyperlipidemia    takes Lipitor every other day   Hypertension    takes losartan daily   Neuropathy    Osteoarthritis of left knee 04/27/2014   Restless leg    Shortness of breath    with exertion   SOB (shortness of breath) 2013   CPET   Tear of medial meniscus of left knee 09/08/2013   Past Surgical History:  Procedure Laterality Date   APPENDECTOMY     BACK SURGERY  07/20/2008   lumb fusion   COLONOSCOPY  2018   EYE SURGERY     both cataracts   KNEE ARTHROSCOPY WITH MEDIAL MENISECTOMY Left 09/08/2013   Procedure: LEFT KNEE ARTHROSCOPY WITH PARTIAL MEDIAL MENISCECTOMY;  Surgeon: Eulas Post, MD;  Location: [facility];  Service: Orthopedics;  Laterality: Left;   LUMBAR LAMINECTOMY/DECOMPRESSION MICRODISCECTOMY  05/13/2012   Procedure: LUMBAR LAMINECTOMY/DECOMPRESSION MICRODISCECTOMY 1 LEVEL;  Surgeon: Barnett Abu, MD;  Location: [facility];  Service: Neurosurgery;  Laterality: Bilateral;  Bilateral Lumbar one -two Decompressive Laminectomy   PARTIAL KNEE ARTHROPLASTY Left 04/27/2014    Procedure: LEFT UNICOMPARTMENTAL KNEE;  Surgeon: Eulas Post, MD;  Location: [facility];  Service: Orthopedics;  Laterality: Left;   TOTAL KNEE ARTHROPLASTY Right 11/06/2022   Procedure: RIGHT TOTAL KNEE ARTHROPLASTY;  Surgeon: Kathryne Hitch, MD;  Location: [facility];  Service: Orthopedics;  Laterality: Right;   TUBAL LIGATION  1974   UPPER GI ENDOSCOPY     Patient Active Problem List   Diagnosis Date Noted   Status post total right knee replacement 11/06/2022   Arthritis of right acromioclavicular joint 08/28/2021   Impingement syndrome of right shoulder 08/19/2021   Nontraumatic incomplete tear of right rotator cuff 08/19/2021   Paresthesia 12/05/2020   Low back pain with right-sided sciatica 12/05/2020   Trochanteric bursitis, right hip 04/24/2020   Essential hypertension 01/12/2020   Sinus bradycardia 01/12/2020   Dyspnea on exertion 01/12/2020   Lumbar stenosis with neurogenic claudication 02/01/2017   Osteoarthritis of left knee 04/27/2014    PCP: Merri Brunette MD   REFERRING PROVIDER: Kathryne Hitch, MD  REFERRING DIAG: [id number] (ICD-10-CM) - Status post total right knee replacement M17.11 (ICD-10-CM) - Unilateral primary osteoarthritis, right knee  THERAPY DIAG:  Stiffness of right knee, not elsewhere classified  Muscle weakness (generalized)  Acute  pain of right knee  Localized edema  Difficulty in walking, not elsewhere classified  Rationale for Evaluation and Treatment: Rehabilitation  ONSET DATE: 11/09/2022  SUBJECTIVE:   SUBJECTIVE STATEMENT: She indicated feeling ok upon arrival today.  Took pain medicine.    PERTINENT HISTORY: Hospital Course: Melissa Maldonado is an 82 y.o. female who was admitted 11/06/2022 for operative treatment ofUnilateral primary osteoarthritis, right knee. Patient has severe unremitting pain that affects sleep, daily activities, and work/hobbies. After pre-op clearance the patient was taken to the  operating room on 11/06/2022 and underwent  Procedure(s): RIGHT TOTAL KNEE ARTHROPLASTY.   PMH: OA, right rotator cuff tear, LBP w/ sciatica, lumbar stenosis, back surgery lumbar fusion & discectomy,  right hip bursitis, HEN, bradycardia,    PAIN:  NPRS scale:  2/10 upon arrival.  Pain location: Rt knee  Pain description: sharp  Aggravating factors: doing too much the knee  Relieving factors: unsure, maybe ice   PRECAUTIONS: None  WEIGHT BEARING RESTRICTIONS: No  FALLS:  Has patient fallen in last 6 months? No  LIVING ENVIRONMENT: Lives with: lives with their spouse Lives in: House/apartment Stairs: 4 STE B rails, split level home with 6 steps up/6 steps down U rail  Has following equipment at home: Single point cane, Walker - 2 wheeled, and toilet raiser   OCCUPATION: retired   PLOF: Independent, Independent with basic ADLs, Independent with gait, and Independent with transfers  PATIENT GOALS: be able to walk without pain  NEXT MD VISIT: manipulation 12/24/2022  OBJECTIVE:   PATIENT SURVEYS:  11/20/2022 FOTO 55, predicted 61  COGNITION: 11/20/2022 Overall cognitive status:  possible STM impairment, family not present to confirm    SENSATION: 11/20/2022 Not tested  EDEMA:  11/20/2022  Some excessive pitting edema in surgical calf, no temperature or color changes, Homan's negative; on daily Xarelto per her report and per chart, she tells me she has been compliant with this medication   LOWER EXTREMITY ROM:  Active ROM Right eval Right 11/23/22 Right  11/30/22 Right 12/07/22 Right 12/15/22 Right 12/18/22 Right 12/24/22 S/p manipulation  Hip flexion         Hip extension         Hip abduction         Hip adduction         Hip internal rotation         Hip external rotation         Knee flexion 64* supine  Seated P: 81* Seated 64-71* PROM and AAROM Seated A: 71* P: 82* Supine  A: 63*, AAROM in sitting 62*, passive 74* Seated AROM in knee flexion stretch 83*  Supine P: 94*   Knee extension 16* supine with heel prop  Supine With heel prop P: -14* Seated with foot supported on stool 9* PROM Supine  A: Quad set -7* Supine with towel roll under heel 22*, seated PROM 12*  Supine P: -9*  Ankle dorsiflexion         Ankle plantarflexion         Ankle inversion         Ankle eversion          (Blank rows = not tested)  LOWER EXTREMITY MMT:  MMT Right eval Left eval Right 12/15/22 Left 12/15/22  Hip flexion 3+ 4+ 4+ 4+  Hip extension      Hip abduction      Hip adduction      Hip internal rotation  Hip external rotation      Knee flexion 4 4 4- 4-  Knee extension 2 4 4 5   Ankle dorsiflexion 5 5    Ankle plantarflexion      Ankle inversion      Ankle eversion       (Blank rows = not tested)    FUNCTIONAL TESTS:  11/20/2022 5 times sit to stand: 43 seconds no UEs; 12/15/22- 15.5 seconds no UEs, severe offshift from R LE/compensations Timed up and go (TUG): 19.5 seconds SPC; 12/15/22- 14.7 seconds SPC   3 minute walk test: 357ft SPC; 12/15/22- 544ft SPC    GAIT: 12/23/2022: pt amb with cane with antalgic pattern with knee flexion in stance and minimal increase flexion for swing.   11/20/2022 / Evaluation: Distance walked: 324ft Assistive device utilized: Single point cane Level of assistance: Modified independence Comments: slow but steady, able to make sharp turns well at each end of walking course, used SPC on wrong side, foot flat and limited heel-toe pattern, limited R knee flexion/extension in stance/swing phases                     TODAY'S TREATMENT:                                                      DATE: 12/25/2022: Therex: UBE UE/LE full revolution lvl 2.0 10 mins for ROM, seat 11 Seated LAQ in 90 deg c 2-3 sec pause in end ranges x 15 Rt leg Supine LAQ in 90 deg hip flexion c 2-3 sec pause in end ranges x 15 Rt leg  Manual: Seated Rt knee flexion c mobilization c movement IR/distraction.  Contract relax c flexion  stretching.     Vasopneumatic 10 mins Rt knee medium compression 34 deg in elevation c ankle pumps throughout    TODAY'S TREATMENT:                                                      DATE: 12/24/2022: Manual Therapy: Seated & supine PROM with overpressure for knee flexion & extension. Soft tissue manual & Hypervolt to quad  Soft tissue mob to medial hamstring during ext stretch  Therapeutic Exercise: Heel slide supine with RLE on red therapy ball with strap & PT AA with 5 sec hold flexion & ext stretch 10 reps. Quad stretch supine hooklying knee flexion with strap & PT assist 30 sec hold 5 reps.  Soft tissue manual therapy during stretch LAQ & active knee flexion supine hooklying with RLE over edge of bed AA for greater range 10 reps  Supine quad set 5 sec hold 10 reps with PT cues for neutral hip rotation Supine SAQ 10 reps Supine heel slide with PT end range assist 5 sec hold. PT emphasized need to move knee frequently during awake hours. Pt verbalized understanding.   Vaso RLE dual hose for knee & ankle due to swelling s/p manipulation medium compression 34*  10 min     TODAY'S TREATMENT:  DATE: 12/22/2022: TherEx Scifit bike seat 11 BLEs &  BUEs level 1 for 8 minutes full rotations Right Knee flexion stretch  on chair seat 10 second holds 3 reps 3 sets moving LLE closer to chair each set  Prone hand 2.5# 1 min 3 sets HS curls prone 2.5# 10 reps 2 sets LAQs & active knee flexion with PT assist end range. Opposing motion contralateral LE.  Sit to/from stand using knees with PT demo & verbal cues on technique 10 reps mat table set at 18".  Bridge 5 sec hold moving Rt foot closer on up position rep 3 & 5 for stretch. Pt only able to tol 5 reps due to back pain. Supine SAQ 2.5# 10 reps 2 sets  Manual therapy: Hypervolt to quads, seated knee flexion with traction tibial int rot and contract relax. Knee ext mob RLE long sit  position  Vaso Right knee medium compression 34*  6 min (pt request)    HOME EXERCISE PROGRAM: Access Code: [email or web address] URL: [email or web address] Date: 11/23/2022 Prepared by: Vladimir Faster  Exercises - Ankle Alphabet in Elevation  - 2-4 x daily - 7 x weekly - 1 sets - 1 reps - Supine Single Leg Ankle Pumps  - 1 x daily - 5 x weekly - 1 sets - 10 reps - 5 seconds hold - Quad Setting and Stretching  - 2-4 x daily - 7 x weekly - 5-10 sets - 10 reps - 5 sec hold - Supine Heel Slide with Strap  - 2-3 x daily - 7 x weekly - 2-3 sets - 10 reps - 5 seconds hold - Supine Knee Extension Strengthening  - 1 x daily - 7 x weekly - 2-3 sets - 10 reps - 5 seconds hold - Supine Straight Leg Raises  - 2-3 x daily - 7 x weekly - 2-3 sets - 10 reps - 5 seconds hold - Seated Knee Flexion Extension AROM   - 2-4 x daily - 7 x weekly - 2-3 sets - 10 reps - 5 seconds hold - Seated straight leg lifts  - 2-3 x daily - 7 x weekly - 2-3 sets - 10 reps - 5 seconds hold - Seated Hamstring Stretch with Strap  - 2-4 x daily - 7 x weekly - 1 sets - 3 reps - 20-30 seconds hold - Seated Long Arc Quad  - 1 x daily - 7 x weekly - 2-3 sets - 10 reps - 5 seconds hold - Heel raises near counter  - 1 x daily - 5 x weekly - 1 sets - 10 reps - 5 seconds hold - standing calf stretch with forefoot on small step or brick  - 1 x daily - 7 x weekly - 1 sets - 3 reps - 20-30 seconds hold - Standing Knee Flexion with Counter Support  - 1 x daily - 7 x weekly - 12-3 sets - 10 reps - 5 seconds hold - Standing March with Counter Support  - 1 x daily - 7 x weekly - 2-3 sets - 10 reps - 5 seconds hold - standing knee extension  - 1 x daily - 7 x weekly - 2-3 sets - 10 reps - 5 seconds hold   ASSESSMENT: CLINICAL IMPRESSION: Strong focus in clinic with extended time with manual for flexion and extension mobility gains.  Continued encouragement of routine HEP for promote movement in each direction.  Continued skilled PT  services indicated at this time.  Strengthening plan to return as  mobility improves.   OBJECTIVE IMPAIRMENTS: Abnormal gait, decreased activity tolerance, decreased balance, decreased coordination, decreased mobility, difficulty walking, decreased ROM, decreased strength, hypomobility, increased edema, increased fascial restrictions, impaired flexibility, impaired UE functional use, and pain.   ACTIVITY LIMITATIONS: sitting, standing, squatting, stairs, transfers, bed mobility, and locomotion level  PARTICIPATION LIMITATIONS: cleaning, laundry, driving, shopping, community activity, occupation, and yard work  PERSONAL FACTORS: Age, Behavior pattern, Education, Fitness, Past/current experiences, Social background, and Time since onset of injury/illness/exacerbation are also affecting patient's functional outcome.   REHAB POTENTIAL: Good  CLINICAL DECISION MAKING: Stable/uncomplicated  EVALUATION COMPLEXITY: Low   GOALS: Goals reviewed with patient? Yes  SHORT TERM GOALS: Target date: 01/15/2023   Will be compliant with appropriate progressive HEP  Baseline: Goal status: ongoing 12/23/2022  2.  R knee PROM ext -5* and flexion 100* Baseline:  Goal status: ongoing 12/23/2022  3.  Will be independent with management techniques for localized edema including wearing compression stockings and appropriate ice routine  Baseline:  Goal status: ongoing 12/23/2022   4.  Patient ambulates with cane with knee ext in stance & flexion in swing within 75% of normal range. Baseline:  Goal status: ongoing 12/23/2022   LONG TERM GOALS: Target date: 02/04/2023    MMT right knee >80% of LLE with HH dynameter Baseline:  Goal status: ongoing 12/23/2022  2.  R knee flexion AROM to be at least 105 degrees  Baseline:  Goal status: ongoing 12/23/2022  3.  Will be able to ascend/descend steps with single rail and reciprocal pattern, no increase in pain  Baseline:  Goal status: ongoing 12/23/2022  4.  Will  complete 5x sit to stand test in 15 seconds or less no UEs  Baseline:  Goal status: ongoing 12/23/2022  5.  Will complete TUG test in 13 seconds or less with LRAD to show improved functional balance Baseline:  Goal status: ongoing 12/23/2022  6.  Will ambulate > 566ft with cane & neg ramp / curb modified independent to show improved mobility and community access  Baseline:  Goal status:ongoing 12/23/2022   PLAN:  PT FREQUENCY: 2-5x/wk (daily when clinic open for 2 weeks post manipulation)  PT DURATION: 6 weeks  PLANNED INTERVENTIONS: Therapeutic exercises, Therapeutic activity, Neuromuscular re-education, Balance training, Gait training, Patient/Family education, Self Care, Joint mobilization, Stair training, Orthotic/Fit training, DME instructions, Aquatic Therapy, Dry Needling, Electrical stimulation, Cryotherapy, Moist heat, Taping, Vasopneumatic device, Ultrasound, Ionotophoresis 4mg /ml Dexamethasone, Manual therapy, and Re-evaluation  PLAN FOR NEXT SESSION: Heavy focus on manual for mobility, progressive strengthening return.    Chyrel Masson, PT, DPT, OCS, ATC 12/25/22  1:40 PM

## 2022-12-28 ENCOUNTER — Encounter: Payer: Self-pay | Admitting: Physical Therapy

## 2022-12-28 ENCOUNTER — Ambulatory Visit (INDEPENDENT_AMBULATORY_CARE_PROVIDER_SITE_OTHER): Payer: Medicare Other | Admitting: Physical Therapy

## 2022-12-28 DIAGNOSIS — M25561 Pain in right knee: Secondary | ICD-10-CM

## 2022-12-28 DIAGNOSIS — R600 Localized edema: Secondary | ICD-10-CM

## 2022-12-28 DIAGNOSIS — M25661 Stiffness of right knee, not elsewhere classified: Secondary | ICD-10-CM | POA: Diagnosis not present

## 2022-12-28 DIAGNOSIS — M6281 Muscle weakness (generalized): Secondary | ICD-10-CM | POA: Diagnosis not present

## 2022-12-28 DIAGNOSIS — R262 Difficulty in walking, not elsewhere classified: Secondary | ICD-10-CM

## 2022-12-28 NOTE — Therapy (Signed)
OUTPATIENT PHYSICAL THERAPY TREATMENT   Patient Name: AVANA JANIAK MRN: [id number] DOB:June 07, 1941, 82 y.o., female Today's Date: 12/28/2022  END OF SESSION:  PT End of Session - 12/28/22 1438     Visit Number 16    Number of Visits 40    Date for PT Re-Evaluation 02/04/23    Authorization Type MCR and BCBS State - [place]    Progress Note Due on Visit 24    PT Start Time 1430    PT Stop Time 1516    PT Time Calculation (min) 46 min    Activity Tolerance Patient tolerated treatment well    Behavior During Therapy WFL for tasks assessed/performed                       Past Medical History:  Diagnosis Date   Arthritis    Chronic back pain    lumbar stenosis   Dysrhythmia    Early cataracts, bilateral    GERD (gastroesophageal reflux disease)    occasionally but no meds required   History of blood transfusion    History of colon polyps    Hyperlipidemia    takes Lipitor every other day   Hypertension    takes losartan daily   Neuropathy    Osteoarthritis of left knee 04/27/2014   Restless leg    Shortness of breath    with exertion   SOB (shortness of breath) 2013   CPET   Tear of medial meniscus of left knee 09/08/2013   Past Surgical History:  Procedure Laterality Date   APPENDECTOMY     BACK SURGERY  07/20/2008   lumb fusion   COLONOSCOPY  2018   EYE SURGERY     both cataracts   KNEE ARTHROSCOPY WITH MEDIAL MENISECTOMY Left 09/08/2013   Procedure: LEFT KNEE ARTHROSCOPY WITH PARTIAL MEDIAL MENISCECTOMY;  Surgeon: Eulas Post, MD;  Location: [facility];  Service: Orthopedics;  Laterality: Left;   LUMBAR LAMINECTOMY/DECOMPRESSION MICRODISCECTOMY  05/13/2012   Procedure: LUMBAR LAMINECTOMY/DECOMPRESSION MICRODISCECTOMY 1 LEVEL;  Surgeon: Barnett Abu, MD;  Location: [facility];  Service: Neurosurgery;  Laterality: Bilateral;  Bilateral Lumbar one -two Decompressive Laminectomy   PARTIAL KNEE ARTHROPLASTY Left 04/27/2014    Procedure: LEFT UNICOMPARTMENTAL KNEE;  Surgeon: Eulas Post, MD;  Location: [facility];  Service: Orthopedics;  Laterality: Left;   TOTAL KNEE ARTHROPLASTY Right 11/06/2022   Procedure: RIGHT TOTAL KNEE ARTHROPLASTY;  Surgeon: Kathryne Hitch, MD;  Location: [facility];  Service: Orthopedics;  Laterality: Right;   TUBAL LIGATION  1974   UPPER GI ENDOSCOPY     Patient Active Problem List   Diagnosis Date Noted   Status post total right knee replacement 11/06/2022   Arthritis of right acromioclavicular joint 08/28/2021   Impingement syndrome of right shoulder 08/19/2021   Nontraumatic incomplete tear of right rotator cuff 08/19/2021   Paresthesia 12/05/2020   Low back pain with right-sided sciatica 12/05/2020   Trochanteric bursitis, right hip 04/24/2020   Essential hypertension 01/12/2020   Sinus bradycardia 01/12/2020   Dyspnea on exertion 01/12/2020   Lumbar stenosis with neurogenic claudication 02/01/2017   Osteoarthritis of left knee 04/27/2014    PCP: Merri Brunette MD   REFERRING PROVIDER: Kathryne Hitch, MD  REFERRING DIAG: [phone] (ICD-10-CM) - Status post total right knee replacement M17.11 (ICD-10-CM) - Unilateral primary osteoarthritis, right knee  THERAPY DIAG:  Stiffness of right knee, not elsewhere classified  Muscle weakness (generalized)  Acute  pain of right knee  Localized edema  Difficulty in walking, not elsewhere classified  Rationale for Evaluation and Treatment: Rehabilitation  ONSET DATE: 11/09/2022  SUBJECTIVE:   SUBJECTIVE STATEMENT: She exercised her knee all weekend.     PERTINENT HISTORY: Hospital Course: NORLENE HODGKIN is an 82 y.o. female who was admitted 11/06/2022 for operative treatment ofUnilateral primary osteoarthritis, right knee. Patient has severe unremitting pain that affects sleep, daily activities, and work/hobbies. After pre-op clearance the patient was taken to the operating room on 11/06/2022 and  underwent  Procedure(s): RIGHT TOTAL KNEE ARTHROPLASTY.   PMH: OA, right rotator cuff tear, LBP w/ sciatica, lumbar stenosis, back surgery lumbar fusion & discectomy,  right hip bursitis, HEN, bradycardia,    PAIN:  NPRS scale: 2/10 upon arrival. (More stiff than sore) Pain location: Rt knee  Pain description: sharp  Aggravating factors: doing too much the knee  Relieving factors: unsure, maybe ice   PRECAUTIONS: None  WEIGHT BEARING RESTRICTIONS: No  FALLS:  Has patient fallen in last 6 months? No  LIVING ENVIRONMENT: Lives with: lives with their spouse Lives in: House/apartment Stairs: 4 STE B rails, split level home with 6 steps up/6 steps down U rail  Has following equipment at home: Single point cane, Walker - 2 wheeled, and toilet raiser   OCCUPATION: retired   PLOF: Independent, Independent with basic ADLs, Independent with gait, and Independent with transfers  PATIENT GOALS: be able to walk without pain  NEXT MD VISIT: manipulation 12/24/2022  OBJECTIVE:   PATIENT SURVEYS:  11/20/2022 FOTO 55, predicted 61  COGNITION: 11/20/2022 Overall cognitive status:  possible STM impairment, family not present to confirm    SENSATION: 11/20/2022 Not tested  EDEMA:  11/20/2022  Some excessive pitting edema in surgical calf, no temperature or color changes, Homan's negative; on daily Xarelto per her report and per chart, she tells me she has been compliant with this medication   LOWER EXTREMITY ROM:  Active ROM Right eval Right 11/23/22 Right  11/30/22 Right 12/07/22 Right 12/15/22 Right 12/18/22 Right 12/24/22 S/p manipulation Right 12/28/22  Hip flexion          Hip extension          Hip abduction          Hip adduction          Hip internal rotation          Hip external rotation          Knee flexion 64* supine  Seated P: 81* Seated 64-71* PROM and AAROM Seated A: 71* P: 82* Supine  A: 63*, AAROM in sitting 62*, passive 74* Seated AROM in knee flexion stretch 83*  Supine P: 94*  Supine  With LE  94*  Knee extension 16* supine with heel prop  Supine With heel prop P: -14* Seated with foot supported on stool 9* PROM Supine  A: Quad set -7* Supine with towel roll under heel 22*, seated PROM 12*  Supine P: -9* Standing P with mob belt -10*  Ankle dorsiflexion          Ankle plantarflexion          Ankle inversion          Ankle eversion           (Blank rows = not tested)  LOWER EXTREMITY MMT:  MMT Right eval Left eval Right 12/15/22 Left 12/15/22  Hip flexion 3+ 4+ 4+ 4+  Hip extension  Hip abduction      Hip adduction      Hip internal rotation      Hip external rotation      Knee flexion 4 4 4- 4-  Knee extension 2 4 4 5   Ankle dorsiflexion 5 5    Ankle plantarflexion      Ankle inversion      Ankle eversion       (Blank rows = not tested)    FUNCTIONAL TESTS:  11/20/2022 5 times sit to stand: 43 seconds no UEs; 12/15/22- 15.5 seconds no UEs, severe offshift from R LE/compensations Timed up and go (TUG): 19.5 seconds SPC; 12/15/22- 14.7 seconds SPC   3 minute walk test: 317ft SPC; 12/15/22- 519ft SPC    GAIT: 12/23/2022: pt amb with cane with antalgic pattern with knee flexion in stance and minimal increase flexion for swing.   11/20/2022 / Evaluation: Distance walked: 375ft Assistive device utilized: Single point cane Level of assistance: Modified independence Comments: slow but steady, able to make sharp turns well at each end of walking course, used SPC on wrong side, foot flat and limited heel-toe pattern, limited R knee flexion/extension in stance/swing phases                     TODAY'S TREATMENT:                                                      DATE: 12/28/2022: Therex: UBE UE/LE full revolution lvl 2.0 10 mins for ROM, seat 11 Seated LAQ in 90 deg  with strap assist end range c 2-3 sec pause in end ranges x 10 Rt leg Supine SAQ c 2-3 sec pause in end ranges x 15 Rt leg  Manual: Seated Rt knee flexion c  mobilization c movement IR/distraction.  Contract relax c flexion stretching.   Standing with RW support mob belt for ext. Initially with bilateral stance, then step stance with RLE in terminal stance position.   Vasopneumatic 5 mins Rt knee medium compression 34 deg in elevation c ankle pumps throughout   TREATMENT:                                                      DATE: 12/25/2022: Therex: UBE UE/LE full revolution lvl 2.0 10 mins for ROM, seat 11 Seated LAQ in 90 deg c 2-3 sec pause in end ranges x 15 Rt leg Supine LAQ in 90 deg hip flexion c 2-3 sec pause in end ranges x 15 Rt leg  Manual: Seated Rt knee flexion c mobilization c movement IR/distraction.  Contract relax c flexion stretching.     Vasopneumatic 10 mins Rt knee medium compression 34 deg in elevation c ankle pumps throughout    TREATMENT:                                                      DATE: 12/24/2022: Manual Therapy: Seated & supine PROM with overpressure for knee flexion & extension. Soft  tissue manual & Hypervolt to quad  Soft tissue mob to medial hamstring during ext stretch  Therapeutic Exercise: Heel slide supine with RLE on red therapy ball with strap & PT AA with 5 sec hold flexion & ext stretch 10 reps. Quad stretch supine hooklying knee flexion with strap & PT assist 30 sec hold 5 reps.  Soft tissue manual therapy during stretch LAQ & active knee flexion supine hooklying with RLE over edge of bed AA for greater range 10 reps  Supine quad set 5 sec hold 10 reps with PT cues for neutral hip rotation Supine SAQ 10 reps Supine heel slide with PT end range assist 5 sec hold. PT emphasized need to move knee frequently during awake hours. Pt verbalized understanding.   Vaso RLE dual hose for knee & ankle due to swelling s/p manipulation medium compression 34*  10 min     HOME EXERCISE PROGRAM: Access Code: [email or web address] URL: [email or web address] Date: 11/23/2022 Prepared by: Vladimir Faster  Exercises - Ankle Alphabet in Elevation  - 2-4 x daily - 7 x weekly - 1 sets - 1 reps - Supine Single Leg Ankle Pumps  - 1 x daily - 5 x weekly - 1 sets - 10 reps - 5 seconds hold - Quad Setting and Stretching  - 2-4 x daily - 7 x weekly - 5-10 sets - 10 reps - 5 sec hold - Supine Heel Slide with Strap  - 2-3 x daily - 7 x weekly - 2-3 sets - 10 reps - 5 seconds hold - Supine Knee Extension Strengthening  - 1 x daily - 7 x weekly - 2-3 sets - 10 reps - 5 seconds hold - Supine Straight Leg Raises  - 2-3 x daily - 7 x weekly - 2-3 sets - 10 reps - 5 seconds hold - Seated Knee Flexion Extension AROM   - 2-4 x daily - 7 x weekly - 2-3 sets - 10 reps - 5 seconds hold - Seated straight leg lifts  - 2-3 x daily - 7 x weekly - 2-3 sets - 10 reps - 5 seconds hold - Seated Hamstring Stretch with Strap  - 2-4 x daily - 7 x weekly - 1 sets - 3 reps - 20-30 seconds hold - Seated Long Arc Quad  - 1 x daily - 7 x weekly - 2-3 sets - 10 reps - 5 seconds hold - Heel raises near counter  - 1 x daily - 5 x weekly - 1 sets - 10 reps - 5 seconds hold - standing calf stretch with forefoot on small step or brick  - 1 x daily - 7 x weekly - 1 sets - 3 reps - 20-30 seconds hold - Standing Knee Flexion with Counter Support  - 1 x daily - 7 x weekly - 12-3 sets - 10 reps - 5 seconds hold - Standing March with Counter Support  - 1 x daily - 7 x weekly - 2-3 sets - 10 reps - 5 seconds hold - standing knee extension  - 1 x daily - 7 x weekly - 2-3 sets - 10 reps - 5 seconds hold   ASSESSMENT: CLINICAL IMPRESSION: Pt continues to have medial knee pain & stiffness limiting flexion & extension. She does respond well to manual therapy & exercises.   Continued skilled PT services indicated at this time.  Strengthening plan to return as mobility improves.   OBJECTIVE IMPAIRMENTS: Abnormal gait, decreased activity tolerance, decreased balance, decreased coordination, decreased mobility,  difficulty walking, decreased  ROM, decreased strength, hypomobility, increased edema, increased fascial restrictions, impaired flexibility, impaired UE functional use, and pain.   ACTIVITY LIMITATIONS: sitting, standing, squatting, stairs, transfers, bed mobility, and locomotion level  PARTICIPATION LIMITATIONS: cleaning, laundry, driving, shopping, community activity, occupation, and yard work  PERSONAL FACTORS: Age, Behavior pattern, Education, Fitness, Past/current experiences, Social background, and Time since onset of injury/illness/exacerbation are also affecting patient's functional outcome.   REHAB POTENTIAL: Good  CLINICAL DECISION MAKING: Stable/uncomplicated  EVALUATION COMPLEXITY: Low   GOALS: Goals reviewed with patient? Yes  SHORT TERM GOALS: Target date: 01/15/2023   Will be compliant with appropriate progressive HEP  Baseline: Goal status: ongoing 12/23/2022  2.  R knee PROM ext -5* and flexion 100* Baseline:  Goal status: ongoing 12/23/2022  3.  Will be independent with management techniques for localized edema including wearing compression stockings and appropriate ice routine  Baseline:  Goal status: ongoing 12/23/2022   4.  Patient ambulates with cane with knee ext in stance & flexion in swing within 75% of normal range. Baseline:  Goal status: ongoing 12/23/2022   LONG TERM GOALS: Target date: 02/04/2023    MMT right knee >80% of LLE with HH dynameter Baseline:  Goal status: ongoing 12/23/2022  2.  R knee flexion AROM to be at least 105 degrees  Baseline:  Goal status: ongoing 12/23/2022  3.  Will be able to ascend/descend steps with single rail and reciprocal pattern, no increase in pain  Baseline:  Goal status: ongoing 12/23/2022  4.  Will complete 5x sit to stand test in 15 seconds or less no UEs  Baseline:  Goal status: ongoing 12/23/2022  5.  Will complete TUG test in 13 seconds or less with LRAD to show improved functional balance Baseline:  Goal status: ongoing 12/23/2022  6.   Will ambulate > 548ft with cane & neg ramp / curb modified independent to show improved mobility and community access  Baseline:  Goal status:ongoing 12/23/2022   PLAN:  PT FREQUENCY: 2-5x/wk (daily when clinic open for 2 weeks post manipulation)  PT DURATION: 6 weeks  PLANNED INTERVENTIONS: Therapeutic exercises, Therapeutic activity, Neuromuscular re-education, Balance training, Gait training, Patient/Family education, Self Care, Joint mobilization, Stair training, Orthotic/Fit training, DME instructions, Aquatic Therapy, Dry Needling, Electrical stimulation, Cryotherapy, Moist heat, Taping, Vasopneumatic device, Ultrasound, Ionotophoresis 4mg /ml Dexamethasone, Manual therapy, and Re-evaluation  PLAN FOR NEXT SESSION: continue daily PT with heavy focus on manual for mobility, progressive strengthening return.     Vladimir Faster, PT 12/28/2022, 3:24 PM

## 2022-12-29 ENCOUNTER — Encounter: Payer: Self-pay | Admitting: Physical Therapy

## 2022-12-29 ENCOUNTER — Ambulatory Visit (INDEPENDENT_AMBULATORY_CARE_PROVIDER_SITE_OTHER): Payer: Medicare Other | Admitting: Physical Therapy

## 2022-12-29 DIAGNOSIS — M25561 Pain in right knee: Secondary | ICD-10-CM

## 2022-12-29 DIAGNOSIS — M25661 Stiffness of right knee, not elsewhere classified: Secondary | ICD-10-CM

## 2022-12-29 DIAGNOSIS — M6281 Muscle weakness (generalized): Secondary | ICD-10-CM | POA: Diagnosis not present

## 2022-12-29 DIAGNOSIS — R600 Localized edema: Secondary | ICD-10-CM | POA: Diagnosis not present

## 2022-12-29 DIAGNOSIS — R262 Difficulty in walking, not elsewhere classified: Secondary | ICD-10-CM | POA: Diagnosis not present

## 2022-12-29 NOTE — Therapy (Signed)
OUTPATIENT PHYSICAL THERAPY TREATMENT   Patient Name: Melissa Maldonado MRN: [id number] DOB:[birth year], 82 y.o., female Today's Date: 12/29/2022  END OF SESSION:  PT End of Session - 12/29/22 1305     Visit Number 17    Number of Visits 40    Date for PT Re-Evaluation 02/04/23    Authorization Type MCR and BCBS State - [place]    Progress Note Due on Visit 24    PT Start Time 1301    PT Stop Time 1350    PT Time Calculation (min) 49 min    Activity Tolerance Patient tolerated treatment well    Behavior During Therapy WFL for tasks assessed/performed                        Past Medical History:  Diagnosis Date   Arthritis    Chronic back pain    lumbar stenosis   Dysrhythmia    Early cataracts, bilateral    GERD (gastroesophageal reflux disease)    occasionally but no meds required   History of blood transfusion    History of colon polyps    Hyperlipidemia    takes Lipitor every other day   Hypertension    takes losartan daily   Neuropathy    Osteoarthritis of left knee 04/27/2014   Restless leg    Shortness of breath    with exertion   SOB (shortness of breath) 2013   CPET   Tear of medial meniscus of left knee 09/08/2013   Past Surgical History:  Procedure Laterality Date   APPENDECTOMY     BACK SURGERY  07/20/2008   lumb fusion   COLONOSCOPY  2018   EYE SURGERY     both cataracts   KNEE ARTHROSCOPY WITH MEDIAL MENISECTOMY Left 09/08/2013   Procedure: LEFT KNEE ARTHROSCOPY WITH PARTIAL MEDIAL MENISCECTOMY;  Surgeon: Eulas Post, MD;  Location: [facility];  Service: Orthopedics;  Laterality: Left;   LUMBAR LAMINECTOMY/DECOMPRESSION MICRODISCECTOMY  05/13/2012   Procedure: LUMBAR LAMINECTOMY/DECOMPRESSION MICRODISCECTOMY 1 LEVEL;  Surgeon: Barnett Abu, MD;  Location: [facility];  Service: Neurosurgery;  Laterality: Bilateral;  Bilateral Lumbar one -two Decompressive Laminectomy   PARTIAL KNEE ARTHROPLASTY Left 04/27/2014    Procedure: LEFT UNICOMPARTMENTAL KNEE;  Surgeon: Eulas Post, MD;  Location: [facility];  Service: Orthopedics;  Laterality: Left;   TOTAL KNEE ARTHROPLASTY Right 11/06/2022   Procedure: RIGHT TOTAL KNEE ARTHROPLASTY;  Surgeon: Kathryne Hitch, MD;  Location: [facility];  Service: Orthopedics;  Laterality: Right;   TUBAL LIGATION  1974   UPPER GI ENDOSCOPY     Patient Active Problem List   Diagnosis Date Noted   Status post total right knee replacement 11/06/2022   Arthritis of right acromioclavicular joint 08/28/2021   Impingement syndrome of right shoulder 08/19/2021   Nontraumatic incomplete tear of right rotator cuff 08/19/2021   Paresthesia 12/05/2020   Low back pain with right-sided sciatica 12/05/2020   Trochanteric bursitis, right hip 04/24/2020   Essential hypertension 01/12/2020   Sinus bradycardia 01/12/2020   Dyspnea on exertion 01/12/2020   Lumbar stenosis with neurogenic claudication 02/01/2017   Osteoarthritis of left knee 04/27/2014    PCP: Merri Brunette MD   REFERRING PROVIDER: Kathryne Hitch, MD  REFERRING DIAG: [phone] (ICD-10-CM) - Status post total right knee replacement M17.11 (ICD-10-CM) - Unilateral primary osteoarthritis, right knee  THERAPY DIAG:  Stiffness of right knee, not elsewhere classified  Muscle weakness (generalized)  Acute pain of right knee  Localized edema  Difficulty in walking, not elsewhere classified  Rationale for Evaluation and Treatment: Rehabilitation  ONSET DATE: 11/09/2022  SUBJECTIVE:   SUBJECTIVE STATEMENT: She has a husband pillow for upright trunk as she can not lay down to sleep. More from back than RLE.   PERTINENT HISTORY: Hospital Course: KYANN VOLLMER is an 82 y.o. female who was admitted 11/06/2022 for operative treatment ofUnilateral primary osteoarthritis, right knee. Patient has severe unremitting pain that affects sleep, daily activities, and work/hobbies. After pre-op  clearance the patient was taken to the operating room on 11/06/2022 and underwent  Procedure(s): RIGHT TOTAL KNEE ARTHROPLASTY.   PMH: OA, right rotator cuff tear, LBP w/ sciatica, lumbar stenosis, back surgery lumbar fusion & discectomy,  right hip bursitis, HEN, bradycardia,    PAIN:  NPRS scale:  2/10 upon arrival. (More stiff than sore) Pain location: Rt knee  Pain description: sharp  Aggravating factors: doing too much the knee  Relieving factors: unsure, maybe ice   PRECAUTIONS: None  WEIGHT BEARING RESTRICTIONS: No  FALLS:  Has patient fallen in last 6 months? No  LIVING ENVIRONMENT: Lives with: lives with their spouse Lives in: House/apartment Stairs: 4 STE B rails, split level home with 6 steps up/6 steps down U rail  Has following equipment at home: Single point cane, Walker - 2 wheeled, and toilet raiser   OCCUPATION: retired   PLOF: Independent, Independent with basic ADLs, Independent with gait, and Independent with transfers  PATIENT GOALS: be able to walk without pain  NEXT MD VISIT: manipulation 12/24/2022  OBJECTIVE:   PATIENT SURVEYS:  12/29/2022: visit 17 45%  12/15/22 visit 10: 41%  11/20/2022 FOTO 55, predicted 61  COGNITION: 11/20/2022 Overall cognitive status:  possible STM impairment, family not present to confirm    SENSATION: 11/20/2022 Not tested  EDEMA:  11/20/2022  Some excessive pitting edema in surgical calf, no temperature or color changes, Homan's negative; on daily Xarelto per her report and per chart, she tells me she has been compliant with this medication   LOWER EXTREMITY ROM:  Active ROM Right eval Right 11/23/22 Right  11/30/22 Right 12/07/22 Right 12/15/22 Right 12/18/22 Right 12/24/22 S/p manipulation Right 12/28/22  Hip flexion          Hip extension          Hip abduction          Hip adduction          Hip internal rotation          Hip external rotation          Knee flexion 64* supine  Seated P: 81* Seated 64-71* PROM  and AAROM Seated A: 71* P: 82* Supine  A: 63*, AAROM in sitting 62*, passive 74* Seated AROM in knee flexion stretch 83* Supine P: 94*  Supine  With LE  94*  Knee extension 16* supine with heel prop  Supine With heel prop P: -14* Seated with foot supported on stool 9* PROM Supine  A: Quad set -7* Supine with towel roll under heel 22*, seated PROM 12*  Supine P: -9* Standing P with mob belt -10*  Ankle dorsiflexion          Ankle plantarflexion          Ankle inversion          Ankle eversion           (Blank rows = not tested)  LOWER EXTREMITY  MMT:  MMT Right eval Left eval Right 12/15/22 Left 12/15/22  Hip flexion 3+ 4+ 4+ 4+  Hip extension      Hip abduction      Hip adduction      Hip internal rotation      Hip external rotation      Knee flexion 4 4 4- 4-  Knee extension 2 4 4 5   Ankle dorsiflexion 5 5    Ankle plantarflexion      Ankle inversion      Ankle eversion       (Blank rows = not tested)    FUNCTIONAL TESTS:  11/20/2022 5 times sit to stand: 43 seconds no UEs; 12/15/22- 15.5 seconds no UEs, severe offshift from R LE/compensations Timed up and go (TUG): 19.5 seconds SPC; 12/15/22- 14.7 seconds SPC   3 minute walk test: 346ft SPC; 12/15/22- 551ft SPC    GAIT: 12/23/2022: pt amb with cane with antalgic pattern with knee flexion in stance and minimal increase flexion for swing.   11/20/2022 / Evaluation: Distance walked: 364ft Assistive device utilized: Single point cane Level of assistance: Modified independence Comments: slow but steady, able to make sharp turns well at each end of walking course, used SPC on wrong side, foot flat and limited heel-toe pattern, limited R knee flexion/extension in stance/swing phases                     TODAY'S TREATMENT:                                                      DATE: 12/29/2022: Therex: Nustep UE/LE lvl 5 10 mins focus on  ROM, seat 8 Leg press BLEs 75# 10 reps 2 sets 5 sec hold flex & ext; Passive hamstring  stretch during rest leg press.  Seated LAQ & active knee flexion with contralateral LE opposing motion 15 reps Supine SAQ c 2-3 sec pause in end ranges x 15 Rt leg  Manual: Hypervolt to quad with noted decreased resistance to knee flexion and soft tissue mobs to medial knee and hamstrings.  Seated Rt knee flexion c mobilization c movement IR/distraction.  Contract relax c flexion stretching.   Standing with RW support mob belt for ext. Initially with bilateral stance, then step stance with RLE in terminal stance position.   Vasopneumatic 5 mins Rt knee medium compression 34 deg in elevation c ankle pumps throughout   TREATMENT:                                                      DATE: 12/28/2022: Therex: UBE UE/LE full revolution lvl 2.0 10 mins for ROM, seat 11 Seated LAQ in 90 deg  with strap assist end range c 2-3 sec pause in end ranges x 10 Rt leg Supine SAQ c 2-3 sec pause in end ranges x 15 Rt leg  Manual: Seated Rt knee flexion c mobilization c movement IR/distraction.  Contract relax c flexion stretching.   Standing with RW support mob belt for ext. Initially with bilateral stance, then step stance with RLE in terminal stance position.   Vasopneumatic 5 mins Rt knee medium compression  34 deg in elevation c ankle pumps throughout   TREATMENT:                                                      DATE: 12/25/2022: Therex: UBE UE/LE full revolution lvl 2.0 10 mins for ROM, seat 11 Seated LAQ in 90 deg c 2-3 sec pause in end ranges x 15 Rt leg Supine LAQ in 90 deg hip flexion c 2-3 sec pause in end ranges x 15 Rt leg  Manual: Seated Rt knee flexion c mobilization c movement IR/distraction.  Contract relax c flexion stretching.     Vasopneumatic 10 mins Rt knee medium compression 34 deg in elevation c ankle pumps throughout     HOME EXERCISE PROGRAM: Access Code: [email or web address] URL: [email or web address] Date: 11/23/2022 Prepared by: Vladimir Faster  Exercises - Ankle Alphabet in Elevation  - 2-4 x daily - 7 x weekly - 1 sets - 1 reps - Supine Single Leg Ankle Pumps  - 1 x daily - 5 x weekly - 1 sets - 10 reps - 5 seconds hold - Quad Setting and Stretching  - 2-4 x daily - 7 x weekly - 5-10 sets - 10 reps - 5 sec hold - Supine Heel Slide with Strap  - 2-3 x daily - 7 x weekly - 2-3 sets - 10 reps - 5 seconds hold - Supine Knee Extension Strengthening  - 1 x daily - 7 x weekly - 2-3 sets - 10 reps - 5 seconds hold - Supine Straight Leg Raises  - 2-3 x daily - 7 x weekly - 2-3 sets - 10 reps - 5 seconds hold - Seated Knee Flexion Extension AROM   - 2-4 x daily - 7 x weekly - 2-3 sets - 10 reps - 5 seconds hold - Seated straight leg lifts  - 2-3 x daily - 7 x weekly - 2-3 sets - 10 reps - 5 seconds hold - Seated Hamstring Stretch with Strap  - 2-4 x daily - 7 x weekly - 1 sets - 3 reps - 20-30 seconds hold - Seated Long Arc Quad  - 1 x daily - 7 x weekly - 2-3 sets - 10 reps - 5 seconds hold - Heel raises near counter  - 1 x daily - 5 x weekly - 1 sets - 10 reps - 5 seconds hold - standing calf stretch with forefoot on small step or brick  - 1 x daily - 7 x weekly - 1 sets - 3 reps - 20-30 seconds hold - Standing Knee Flexion with Counter Support  - 1 x daily - 7 x weekly - 12-3 sets - 10 reps - 5 seconds hold - Standing March with Counter Support  - 1 x daily - 7 x weekly - 2-3 sets - 10 reps - 5 seconds hold - standing knee extension  - 1 x daily - 7 x weekly - 2-3 sets - 10 reps - 5 seconds hold   ASSESSMENT: CLINICAL IMPRESSION: Patient response well to soft tissue mobilizations including Hypervolt compared to manual passive range of motion.  Patient continues to report medial knee pain restriction in flexion and medial hamstring pain restricting extension.  Continued skilled PT services indicated at this time.   OBJECTIVE IMPAIRMENTS: Abnormal gait, decreased activity tolerance, decreased balance, decreased coordination,  decreased mobility, difficulty walking, decreased ROM, decreased strength, hypomobility, increased edema, increased fascial restrictions, impaired flexibility, impaired UE functional use, and pain.   ACTIVITY LIMITATIONS: sitting, standing, squatting, stairs, transfers, bed mobility, and locomotion level  PARTICIPATION LIMITATIONS: cleaning, laundry, driving, shopping, community activity, occupation, and yard work  PERSONAL FACTORS: Age, Behavior pattern, Education, Fitness, Past/current experiences, Social background, and Time since onset of injury/illness/exacerbation are also affecting patient's functional outcome.   REHAB POTENTIAL: Good  CLINICAL DECISION MAKING: Stable/uncomplicated  EVALUATION COMPLEXITY: Low   GOALS: Goals reviewed with patient? Yes  SHORT TERM GOALS: Target date: 01/15/2023   Will be compliant with appropriate progressive HEP  Baseline: Goal status: ongoing 12/23/2022  2.  R knee PROM ext -5* and flexion 100* Baseline:  Goal status: ongoing 12/23/2022  3.  Will be independent with management techniques for localized edema including wearing compression stockings and appropriate ice routine  Baseline:  Goal status: ongoing 12/23/2022   4.  Patient ambulates with cane with knee ext in stance & flexion in swing within 75% of normal range. Baseline:  Goal status: ongoing 12/23/2022   LONG TERM GOALS: Target date: 02/04/2023    MMT right knee >80% of LLE with HH dynameter Baseline:  Goal status: ongoing 12/23/2022  2.  R knee flexion AROM to be at least 105 degrees  Baseline:  Goal status: ongoing 12/23/2022  3.  Will be able to ascend/descend steps with single rail and reciprocal pattern, no increase in pain  Baseline:  Goal status: ongoing 12/23/2022  4.  Will complete 5x sit to stand test in 15 seconds or less no UEs  Baseline:  Goal status: ongoing 12/23/2022  5.  Will complete TUG test in 13 seconds or less with LRAD to show improved functional  balance Baseline:  Goal status: ongoing 12/23/2022  6.  Will ambulate > 551ft with cane & neg ramp / curb modified independent to show improved mobility and community access  Baseline:  Goal status:ongoing 12/23/2022   PLAN:  PT FREQUENCY: 2-5x/wk (daily when clinic open for 2 weeks post manipulation)  PT DURATION: 6 weeks  PLANNED INTERVENTIONS: Therapeutic exercises, Therapeutic activity, Neuromuscular re-education, Balance training, Gait training, Patient/Family education, Self Care, Joint mobilization, Stair training, Orthotic/Fit training, DME instructions, Aquatic Therapy, Dry Needling, Electrical stimulation, Cryotherapy, Moist heat, Taping, Vasopneumatic device, Ultrasound, Ionotophoresis 4mg /ml Dexamethasone, Manual therapy, and Re-evaluation  PLAN FOR NEXT SESSION:  heavy focus on manual therapy including soft tissue work for mobility, progressive strengthening return.     Vladimir Faster, PT, DPT 12/29/2022, 4:22 PM

## 2022-12-30 ENCOUNTER — Other Ambulatory Visit: Payer: Self-pay | Admitting: Orthopaedic Surgery

## 2022-12-30 ENCOUNTER — Encounter: Payer: Self-pay | Admitting: Physical Therapy

## 2022-12-30 ENCOUNTER — Ambulatory Visit (INDEPENDENT_AMBULATORY_CARE_PROVIDER_SITE_OTHER): Payer: Medicare Other | Admitting: Physical Therapy

## 2022-12-30 ENCOUNTER — Telehealth: Payer: Self-pay | Admitting: Orthopaedic Surgery

## 2022-12-30 DIAGNOSIS — M25661 Stiffness of right knee, not elsewhere classified: Secondary | ICD-10-CM | POA: Diagnosis not present

## 2022-12-30 DIAGNOSIS — R262 Difficulty in walking, not elsewhere classified: Secondary | ICD-10-CM | POA: Diagnosis not present

## 2022-12-30 DIAGNOSIS — M25561 Pain in right knee: Secondary | ICD-10-CM

## 2022-12-30 DIAGNOSIS — M6281 Muscle weakness (generalized): Secondary | ICD-10-CM | POA: Diagnosis not present

## 2022-12-30 DIAGNOSIS — R600 Localized edema: Secondary | ICD-10-CM

## 2022-12-30 MED ORDER — OXYCODONE HCL 5 MG PO TABS: 5.0000 mg | ORAL_TABLET | Freq: Four times a day (QID) | ORAL | 0 refills | Status: DC | PRN

## 2022-12-30 NOTE — Therapy (Signed)
OUTPATIENT PHYSICAL THERAPY TREATMENT   Patient Name: Melissa Maldonado MRN: [id number] DOB:1941/03/19, 82 y.o., female Today's Date: 12/30/2022  END OF SESSION:  PT End of Session - 12/30/22 1017     Visit Number 18    Number of Visits 40    Date for PT Re-Evaluation 02/04/23    Authorization Type MCR and BCBS State - [place]    Progress Note Due on Visit 24    PT Start Time 1015    PT Stop Time 1103    PT Time Calculation (min) 48 min    Activity Tolerance Patient tolerated treatment well    Behavior During Therapy WFL for tasks assessed/performed                        Past Medical History:  Diagnosis Date   Arthritis    Chronic back pain    lumbar stenosis   Dysrhythmia    Early cataracts, bilateral    GERD (gastroesophageal reflux disease)    occasionally but no meds required   History of blood transfusion    History of colon polyps    Hyperlipidemia    takes Lipitor every other day   Hypertension    takes losartan daily   Neuropathy    Osteoarthritis of left knee 04/27/2014   Restless leg    Shortness of breath    with exertion   SOB (shortness of breath) 2013   CPET   Tear of medial meniscus of left knee 09/08/2013   Past Surgical History:  Procedure Laterality Date   APPENDECTOMY     BACK SURGERY  07/20/2008   lumb fusion   COLONOSCOPY  2018   EYE SURGERY     both cataracts   KNEE ARTHROSCOPY WITH MEDIAL MENISECTOMY Left 09/08/2013   Procedure: LEFT KNEE ARTHROSCOPY WITH PARTIAL MEDIAL MENISCECTOMY;  Surgeon: Eulas Post, MD;  Location: [facility];  Service: Orthopedics;  Laterality: Left;   LUMBAR LAMINECTOMY/DECOMPRESSION MICRODISCECTOMY  05/13/2012   Procedure: LUMBAR LAMINECTOMY/DECOMPRESSION MICRODISCECTOMY 1 LEVEL;  Surgeon: Barnett Abu, MD;  Location: [facility];  Service: Neurosurgery;  Laterality: Bilateral;  Bilateral Lumbar one -two Decompressive Laminectomy   PARTIAL KNEE ARTHROPLASTY Left 04/27/2014    Procedure: LEFT UNICOMPARTMENTAL KNEE;  Surgeon: Eulas Post, MD;  Location: [facility];  Service: Orthopedics;  Laterality: Left;   TOTAL KNEE ARTHROPLASTY Right 11/06/2022   Procedure: RIGHT TOTAL KNEE ARTHROPLASTY;  Surgeon: Kathryne Hitch, MD;  Location: [facility];  Service: Orthopedics;  Laterality: Right;   TUBAL LIGATION  1974   UPPER GI ENDOSCOPY     Patient Active Problem List   Diagnosis Date Noted   Status post total right knee replacement 11/06/2022   Arthritis of right acromioclavicular joint 08/28/2021   Impingement syndrome of right shoulder 08/19/2021   Nontraumatic incomplete tear of right rotator cuff 08/19/2021   Paresthesia 12/05/2020   Low back pain with right-sided sciatica 12/05/2020   Trochanteric bursitis, right hip 04/24/2020   Essential hypertension 01/12/2020   Sinus bradycardia 01/12/2020   Dyspnea on exertion 01/12/2020   Lumbar stenosis with neurogenic claudication 02/01/2017   Osteoarthritis of left knee 04/27/2014    PCP: Merri Brunette MD   REFERRING PROVIDER: Kathryne Hitch, MD  REFERRING DIAG: [phone] (ICD-10-CM) - Status post total right knee replacement M17.11 (ICD-10-CM) - Unilateral primary osteoarthritis, right knee  THERAPY DIAG:  Stiffness of right knee, not elsewhere classified  Muscle weakness (generalized)  Acute pain of right knee  Localized edema  Difficulty in walking, not elsewhere classified  Rationale for Evaluation and Treatment: Rehabilitation  ONSET DATE: 11/09/2022  SUBJECTIVE:   SUBJECTIVE STATEMENT: She had a restless night as she tried to sleep flat.   PERTINENT HISTORY: Hospital Course: Melissa Maldonado is an 82 y.o. female who was admitted 11/06/2022 for operative treatment ofUnilateral primary osteoarthritis, right knee. Patient has severe unremitting pain that affects sleep, daily activities, and work/hobbies. After pre-op clearance the patient was taken to the operating room  on 11/06/2022 and underwent  Procedure(s): RIGHT TOTAL KNEE ARTHROPLASTY.   PMH: OA, right rotator cuff tear, LBP w/ sciatica, lumbar stenosis, back surgery lumbar fusion & discectomy,  right hip bursitis, HEN, bradycardia,    PAIN:  NPRS scale: 0/10 (More stiff than sore) Pain location: Rt knee  Pain description: sharp  Aggravating factors: doing too much the knee  Relieving factors: unsure, maybe ice   PRECAUTIONS: None  WEIGHT BEARING RESTRICTIONS: No  FALLS:  Has patient fallen in last 6 months? No  LIVING ENVIRONMENT: Lives with: lives with their spouse Lives in: House/apartment Stairs: 4 STE B rails, split level home with 6 steps up/6 steps down U rail  Has following equipment at home: Single point cane, Walker - 2 wheeled, and toilet raiser   OCCUPATION: retired   PLOF: Independent, Independent with basic ADLs, Independent with gait, and Independent with transfers  PATIENT GOALS: be able to walk without pain  NEXT MD VISIT: manipulation 12/24/2022  OBJECTIVE:   PATIENT SURVEYS:  12/29/2022: visit 17 45%  12/15/22 visit 10: 41%  11/20/2022 FOTO 55, predicted 61  COGNITION: 11/20/2022 Overall cognitive status:  possible STM impairment, family not present to confirm    SENSATION: 11/20/2022 Not tested  EDEMA:  11/20/2022  Some excessive pitting edema in surgical calf, no temperature or color changes, Homan's negative; on daily Xarelto per her report and per chart, she tells me she has been compliant with this medication   LOWER EXTREMITY ROM:  Active ROM Right eval Right 11/23/22 Right  11/30/22 Right 12/07/22 Right 12/15/22 Right 12/18/22 Right 12/24/22 S/p manipulation Right 12/28/22 Right 12/30/22  Hip flexion           Hip extension           Hip abduction           Hip adduction           Hip internal rotation           Hip external rotation           Knee flexion 64* supine  Seated P: 81* Seated 64-71* PROM and AAROM Seated A: 71* P: 82* Supine  A:  63*, AAROM in sitting 62*, passive 74* Seated AROM in knee flexion stretch 83* Supine P: 94*  Supine  With LE  94* Seated P: 95*  Knee extension 16* supine with heel prop  Supine With heel prop P: -14* Seated with foot supported on stool 9* PROM Supine  A: Quad set -7* Supine with towel roll under heel 22*, seated PROM 12*  Supine P: -9* Standing P with mob belt -10* Standing with mob belt P: -3*  Ankle dorsiflexion           Ankle plantarflexion           Ankle inversion           Ankle eversion            (  Blank rows = not tested)  LOWER EXTREMITY MMT:  MMT Right eval Left eval Right 12/15/22 Left 12/15/22  Hip flexion 3+ 4+ 4+ 4+  Hip extension      Hip abduction      Hip adduction      Hip internal rotation      Hip external rotation      Knee flexion 4 4 4- 4-  Knee extension 2 4 4 5   Ankle dorsiflexion 5 5    Ankle plantarflexion      Ankle inversion      Ankle eversion       (Blank rows = not tested)    FUNCTIONAL TESTS:  11/20/2022 5 times sit to stand: 43 seconds no UEs; 12/15/22- 15.5 seconds no UEs, severe offshift from R LE/compensations Timed up and go (TUG): 19.5 seconds SPC; 12/15/22- 14.7 seconds SPC   3 minute walk test: 339ft SPC; 12/15/22- 540ft SPC    GAIT: 12/23/2022: pt amb with cane with antalgic pattern with knee flexion in stance and minimal increase flexion for swing.   11/20/2022 / Evaluation: Distance walked: 350ft Assistive device utilized: Single point cane Level of assistance: Modified independence Comments: slow but steady, able to make sharp turns well at each end of walking course, used SPC on wrong side, foot flat and limited heel-toe pattern, limited R knee flexion/extension in stance/swing phases                     TODAY'S TREATMENT:                                                      DATE: 12/30/2022: Therex: UBE UE/LE full revolution lvl 4.0 for 4 min then level 2 BLEs only for 4 min for ROM, seat 11 Leg press BLEs 75# 10 reps  2 sets 5 sec hold flex & ext; RLE only 37# 10 reps 2 sets  5 sec hold flex & ext Standing squat at sink over chair with max knee flexion to 81* 10 reps. PT demo & verbal cues on technique Gastroc stretch on incline board 30 sec 2 reps Heel raises on incline board 10 reps BUE support   Manual: Hypervolt to quad with noted decreased resistance to knee flexion and soft tissue mobs to medial knee and hamstrings.  Passive hamstring stretch during rest leg press.  Seated Rt knee flexion c mobilization c movement IR/distraction.  Contract relax c flexion stretching.   Standing on incline board with BUE support mob belt for ext. Initially with bilateral stance, then step stance with RLE in terminal stance position.   Vasopneumatic 5 mins Rt knee medium compression 34 deg in elevation c ankle pumps throughout   TREATMENT:                                                      DATE: 12/29/2022: Therex: Nustep UE/LE lvl 5 10 mins focus on  ROM, seat 8 Leg press BLEs 75# 10 reps 2 sets 5 sec hold flex & ext; Passive hamstring stretch during rest leg press.  Seated LAQ & active knee flexion with contralateral LE opposing motion 15 reps Supine  SAQ c 2-3 sec pause in end ranges x 15 Rt leg  Manual: Hypervolt to quad with noted decreased resistance to knee flexion and soft tissue mobs to medial knee and hamstrings.  Seated Rt knee flexion c mobilization c movement IR/distraction.  Contract relax c flexion stretching.   Standing with RW support mob belt for ext. Initially with bilateral stance, then step stance with RLE in terminal stance position.   Vasopneumatic 5 mins Rt knee medium compression 34 deg in elevation c ankle pumps throughout   TREATMENT:                                                      DATE: 12/28/2022: Therex: UBE UE/LE full revolution lvl 2.0 10 mins for ROM, seat 11 Seated LAQ in 90 deg  with strap assist end range c 2-3 sec pause in end ranges x 10 Rt leg Supine SAQ c 2-3 sec  pause in end ranges x 15 Rt leg  Manual: Seated Rt knee flexion c mobilization c movement IR/distraction.  Contract relax c flexion stretching.   Standing with RW support mob belt for ext. Initially with bilateral stance, then step stance with RLE in terminal stance position.   Vasopneumatic 5 mins Rt knee medium compression 34 deg in elevation c ankle pumps throughout      HOME EXERCISE PROGRAM: Access Code: [email or web address] URL: [email or web address] Date: 11/23/2022 Prepared by: Vladimir Faster  Exercises - Ankle Alphabet in Elevation  - 2-4 x daily - 7 x weekly - 1 sets - 1 reps - Supine Single Leg Ankle Pumps  - 1 x daily - 5 x weekly - 1 sets - 10 reps - 5 seconds hold - Quad Setting and Stretching  - 2-4 x daily - 7 x weekly - 5-10 sets - 10 reps - 5 sec hold - Supine Heel Slide with Strap  - 2-3 x daily - 7 x weekly - 2-3 sets - 10 reps - 5 seconds hold - Supine Knee Extension Strengthening  - 1 x daily - 7 x weekly - 2-3 sets - 10 reps - 5 seconds hold - Supine Straight Leg Raises  - 2-3 x daily - 7 x weekly - 2-3 sets - 10 reps - 5 seconds hold - Seated Knee Flexion Extension AROM   - 2-4 x daily - 7 x weekly - 2-3 sets - 10 reps - 5 seconds hold - Seated straight leg lifts  - 2-3 x daily - 7 x weekly - 2-3 sets - 10 reps - 5 seconds hold - Seated Hamstring Stretch with Strap  - 2-4 x daily - 7 x weekly - 1 sets - 3 reps - 20-30 seconds hold - Seated Long Arc Quad  - 1 x daily - 7 x weekly - 2-3 sets - 10 reps - 5 seconds hold - Heel raises near counter  - 1 x daily - 5 x weekly - 1 sets - 10 reps - 5 seconds hold - standing calf stretch with forefoot on small step or brick  - 1 x daily - 7 x weekly - 1 sets - 3 reps - 20-30 seconds hold - Standing Knee Flexion with Counter Support  - 1 x daily - 7 x weekly - 12-3 sets - 10 reps - 5 seconds hold - Standing March with Counter  Support  - 1 x daily - 7 x weekly - 2-3 sets - 10 reps - 5 seconds hold - standing knee  extension  - 1 x daily - 7 x weekly - 2-3 sets - 10 reps - 5 seconds hold   ASSESSMENT: CLINICAL IMPRESSION: Patient's knee ext range was improved following mobilizations.  Melissa Maldonado continues to be limited by pain and her flexion range has a painful end feel.   Continued skilled PT services indicated at this time.   OBJECTIVE IMPAIRMENTS: Abnormal gait, decreased activity tolerance, decreased balance, decreased coordination, decreased mobility, difficulty walking, decreased ROM, decreased strength, hypomobility, increased edema, increased fascial restrictions, impaired flexibility, impaired UE functional use, and pain.   ACTIVITY LIMITATIONS: sitting, standing, squatting, stairs, transfers, bed mobility, and locomotion level  PARTICIPATION LIMITATIONS: cleaning, laundry, driving, shopping, community activity, occupation, and yard work  PERSONAL FACTORS: Age, Behavior pattern, Education, Fitness, Past/current experiences, Social background, and Time since onset of injury/illness/exacerbation are also affecting patient's functional outcome.   REHAB POTENTIAL: Good  CLINICAL DECISION MAKING: Stable/uncomplicated  EVALUATION COMPLEXITY: Low   GOALS: Goals reviewed with patient? Yes  SHORT TERM GOALS: Target date: 01/15/2023   Will be compliant with appropriate progressive HEP  Baseline: Goal status: ongoing 12/23/2022  2.  R knee PROM ext -5* and flexion 100* Baseline:  Goal status: ongoing 12/23/2022  3.  Will be independent with management techniques for localized edema including wearing compression stockings and appropriate ice routine  Baseline:  Goal status: ongoing 12/23/2022   4.  Patient ambulates with cane with knee ext in stance & flexion in swing within 75% of normal range. Baseline:  Goal status: ongoing 12/23/2022   LONG TERM GOALS: Target date: 02/04/2023    MMT right knee >80% of LLE with HH dynameter Baseline:  Goal status: ongoing 12/23/2022  2.  R knee flexion AROM  to be at least 105 degrees  Baseline:  Goal status: ongoing 12/23/2022  3.  Will be able to ascend/descend steps with single rail and reciprocal pattern, no increase in pain  Baseline:  Goal status: ongoing 12/23/2022  4.  Will complete 5x sit to stand test in 15 seconds or less no UEs  Baseline:  Goal status: ongoing 12/23/2022  5.  Will complete TUG test in 13 seconds or less with LRAD to show improved functional balance Baseline:  Goal status: ongoing 12/23/2022  6.  Will ambulate > 555ft with cane & neg ramp / curb modified independent to show improved mobility and community access  Baseline:  Goal status:ongoing 12/23/2022   PLAN:  PT FREQUENCY: 2-5x/wk (daily when clinic open for 2 weeks post manipulation)  PT DURATION: 6 weeks  PLANNED INTERVENTIONS: Therapeutic exercises, Therapeutic activity, Neuromuscular re-education, Balance training, Gait training, Patient/Family education, Self Care, Joint mobilization, Stair training, Orthotic/Fit training, DME instructions, Aquatic Therapy, Dry Needling, Electrical stimulation, Cryotherapy, Moist heat, Taping, Vasopneumatic device, Ultrasound, Ionotophoresis 4mg /ml Dexamethasone, Manual therapy, and Re-evaluation  PLAN FOR NEXT SESSION: continue with daily PT when clinic is open following mobilization under anesthesia on 6/6.  heavy focus on manual therapy including soft tissue work for mobility, progressive strengthening return.     Vladimir Faster, PT, DPT 12/30/2022, 12:10 PM

## 2022-12-30 NOTE — Telephone Encounter (Signed)
Patient is here for PT. Says she needs  a refill on pain medication.

## 2022-12-31 ENCOUNTER — Ambulatory Visit (INDEPENDENT_AMBULATORY_CARE_PROVIDER_SITE_OTHER): Payer: Medicare Other | Admitting: Physical Therapy

## 2022-12-31 ENCOUNTER — Encounter: Payer: Self-pay | Admitting: Physical Therapy

## 2022-12-31 ENCOUNTER — Telehealth: Payer: Self-pay | Admitting: Orthopaedic Surgery

## 2022-12-31 DIAGNOSIS — R600 Localized edema: Secondary | ICD-10-CM | POA: Diagnosis not present

## 2022-12-31 DIAGNOSIS — M25661 Stiffness of right knee, not elsewhere classified: Secondary | ICD-10-CM

## 2022-12-31 DIAGNOSIS — R262 Difficulty in walking, not elsewhere classified: Secondary | ICD-10-CM

## 2022-12-31 DIAGNOSIS — M6281 Muscle weakness (generalized): Secondary | ICD-10-CM | POA: Diagnosis not present

## 2022-12-31 DIAGNOSIS — M25561 Pain in right knee: Secondary | ICD-10-CM

## 2022-12-31 NOTE — Therapy (Signed)
OUTPATIENT PHYSICAL THERAPY TREATMENT   Patient Name: Melissa Maldonado MRN: [id number] DOB:Jun 14, 1941, 82 y.o., female Today's Date: 01/01/2023  END OF SESSION:  PT End of Session - 01/01/23 1302     Visit Number 20    Number of Visits 40    Authorization Type MCR and BCBS [place] - [place]    Authorization Time Period 11/20/22 to 01/15/23    Progress Note Due on Visit 24    PT Start Time 1302    PT Stop Time 1340    PT Time Calculation (min) 38 min    Activity Tolerance Patient tolerated treatment well    Behavior During Therapy WFL for tasks assessed/performed               Past Medical History:  Diagnosis Date   Arthritis    Chronic back pain    lumbar stenosis   Dysrhythmia    Early cataracts, bilateral    GERD (gastroesophageal reflux disease)    occasionally but no meds required   History of blood transfusion    History of colon polyps    Hyperlipidemia    takes Lipitor every other day   Hypertension    takes losartan daily   Neuropathy    Osteoarthritis of left knee 04/27/2014   Restless leg    Shortness of breath    with exertion   SOB (shortness of breath) 2013   CPET   Tear of medial meniscus of left knee 09/08/2013   Past Surgical History:  Procedure Laterality Date   APPENDECTOMY     BACK SURGERY  07/20/2008   lumb fusion   COLONOSCOPY  2018   EYE SURGERY     both cataracts   KNEE ARTHROSCOPY WITH MEDIAL MENISECTOMY Left 09/08/2013   Procedure: LEFT KNEE ARTHROSCOPY WITH PARTIAL MEDIAL MENISCECTOMY;  Surgeon: Eulas Post, MD;  Location: [facility];  Service: Orthopedics;  Laterality: Left;   LUMBAR LAMINECTOMY/DECOMPRESSION MICRODISCECTOMY  05/13/2012   Procedure: LUMBAR LAMINECTOMY/DECOMPRESSION MICRODISCECTOMY 1 LEVEL;  Surgeon: Barnett Abu, MD;  Location: [facility];  Service: Neurosurgery;  Laterality: Bilateral;  Bilateral Lumbar one -two Decompressive Laminectomy   PARTIAL KNEE ARTHROPLASTY Left 04/27/2014   Procedure:  LEFT UNICOMPARTMENTAL KNEE;  Surgeon: Eulas Post, MD;  Location: [facility];  Service: Orthopedics;  Laterality: Left;   TOTAL KNEE ARTHROPLASTY Right 11/06/2022   Procedure: RIGHT TOTAL KNEE ARTHROPLASTY;  Surgeon: Kathryne Hitch, MD;  Location: [facility];  Service: Orthopedics;  Laterality: Right;   TUBAL LIGATION  1974   UPPER GI ENDOSCOPY     Patient Active Problem List   Diagnosis Date Noted   Status post total right knee replacement 11/06/2022   Arthritis of right acromioclavicular joint 08/28/2021   Impingement syndrome of right shoulder 08/19/2021   Nontraumatic incomplete tear of right rotator cuff 08/19/2021   Paresthesia 12/05/2020   Low back pain with right-sided sciatica 12/05/2020   Trochanteric bursitis, right hip 04/24/2020   Essential hypertension 01/12/2020   Sinus bradycardia 01/12/2020   Dyspnea on exertion 01/12/2020   Lumbar stenosis with neurogenic claudication 02/01/2017   Osteoarthritis of left knee 04/27/2014    PCP: Merri Brunette MD   REFERRING PROVIDER: Kathryne Hitch, MD  REFERRING DIAG: [phone] (ICD-10-CM) - Status post total right knee replacement M17.11 (ICD-10-CM) - Unilateral primary osteoarthritis, right knee  THERAPY DIAG:  Stiffness of right knee, not elsewhere classified  Muscle weakness (generalized)  Acute pain of right knee  Localized edema  Difficulty in walking, not elsewhere classified  Rationale for Evaluation and Treatment: Rehabilitation  ONSET DATE: 11/09/2022  SUBJECTIVE:   SUBJECTIVE STATEMENT: Pt states she is feeling pretty stiff today, not much pain. HEP going well at home. States she tends to have pain for remainder of day after sessions  PERTINENT HISTORY: Hospital Course: Melissa Maldonado is an 82 y.o. female who was admitted 11/06/2022 for operative treatment ofUnilateral primary osteoarthritis, right knee. Patient has severe unremitting pain that affects sleep, daily activities,  and work/hobbies. After pre-op clearance the patient was taken to the operating room on 11/06/2022 and underwent  Procedure(s): RIGHT TOTAL KNEE ARTHROPLASTY.   PMH: OA, right rotator cuff tear, LBP w/ sciatica, lumbar stenosis, back surgery lumbar fusion & discectomy,  right hip bursitis, HEN, bradycardia,    PAIN:  NPRS scale: 0/10 (stiffness) Pain location: Rt knee  Pain description: sharp  Aggravating factors: doing too much the knee  Relieving factors: unsure, maybe ice   PRECAUTIONS: None  WEIGHT BEARING RESTRICTIONS: No  FALLS:  Has patient fallen in last 6 months? No  LIVING ENVIRONMENT: Lives with: lives with their spouse Lives in: House/apartment Stairs: 4 STE B rails, split level home with 6 steps up/6 steps down U rail  Has following equipment at home: Single point cane, Walker - 2 wheeled, and toilet raiser   OCCUPATION: retired   PLOF: Independent, Independent with basic ADLs, Independent with gait, and Independent with transfers  PATIENT GOALS: be able to walk without pain  NEXT MD VISIT: manipulation 12/24/2022  OBJECTIVE: (objective measures completed at initial evaluation unless otherwise dated)   PATIENT SURVEYS:  12/29/2022: visit 17 45%  12/15/22 visit 10: 41%  11/20/2022 FOTO 55, predicted 61  COGNITION: 11/20/2022 Overall cognitive status:  possible STM impairment, family not present to confirm    SENSATION: 11/20/2022 Not tested  EDEMA:  11/20/2022  Some excessive pitting edema in surgical calf, no temperature or color changes, Homan's negative; on daily Xarelto per her report and per chart, she tells me she has been compliant with this medication   LOWER EXTREMITY ROM:  Active ROM Right eval Right 11/23/22 Right  11/30/22 Right 12/07/22 Right 12/15/22 Right 12/18/22 Right 12/24/22 S/p manipulation Right 12/28/22 Right 12/30/22 Right 12/31/22   Hip flexion            Hip extension            Hip abduction            Hip adduction            Hip  internal rotation            Hip external rotation            Knee flexion 64* supine  Seated P: 81* Seated 64-71* PROM and AAROM Seated A: 71* P: 82* Supine  A: 63*, AAROM in sitting 62*, passive 74* Seated AROM in knee flexion stretch 83* Supine P: 94*  Supine  With LE  94* Seated P: 95* AAROM 96* seated  Knee extension 16* supine with heel prop  Supine With heel prop P: -14* Seated with foot supported on stool 9* PROM Supine  A: Quad set -7* Supine with towel roll under heel 22*, seated PROM 12*  Supine P: -9* Standing P with mob belt -10* Standing with mob belt P: -3*   Ankle dorsiflexion            Ankle plantarflexion  Ankle inversion            Ankle eversion             (Blank rows = not tested)  LOWER EXTREMITY MMT:  MMT Right eval Left eval Right 12/15/22 Left 12/15/22  Hip flexion 3+ 4+ 4+ 4+  Hip extension      Hip abduction      Hip adduction      Hip internal rotation      Hip external rotation      Knee flexion 4 4 4- 4-  Knee extension 2 4 4 5   Ankle dorsiflexion 5 5    Ankle plantarflexion      Ankle inversion      Ankle eversion       (Blank rows = not tested)    FUNCTIONAL TESTS:  11/20/2022 5 times sit to stand: 43 seconds no UEs; 12/15/22- 15.5 seconds no UEs, severe offshift from R LE/compensations Timed up and go (TUG): 19.5 seconds SPC; 12/15/22- 14.7 seconds SPC   3 minute walk test: 374ft SPC; 12/15/22- 521ft SPC    GAIT: 12/23/2022: pt amb with cane with antalgic pattern with knee flexion in stance and minimal increase flexion for swing.   11/20/2022 / Evaluation: Distance walked: 393ft Assistive device utilized: Single point cane Level of assistance: Modified independence Comments: slow but steady, able to make sharp turns well at each end of walking course, used SPC on wrong side, foot flat and limited heel-toe pattern, limited R knee flexion/extension in stance/swing phases                     TODAY'S TREATMENT:      [facility]  Adult PT Treatment:                                                DATE: 01/01/23 Therapeutic Exercise: Scifit bike LE/UE full revolutions (4 min seat at 11, 4 min seat at 10 for inc ROM) Long sitting (with support) quad set 2x12 cues for full ROM and breath control LAQ seated 2x8 cues for DF and full ROM, second set PT assist for inc ROM  Seated heel slides w/ strap and towel 2x8 RLE cues for ROM Manual Therapy: Seated edge of met; passive physiological movement flex/ext knee to pt tolerance, oscillations to reduce muscle guarding, gentle distraction at knee joint     12/31/22  TherEx  Scifit bike UE/LE seat 11 full revolutions x4 minutes, then 4 minutes at seat 10 HS stretches supine 3x30 seconds  Knee extension stretch with ice pack for overpressure x5 minutes     Manual  Patella mobilizations all directions grade III  Tennis ball massage to quad, gave pt tennis ball and instructions for home use  Knee extension over pressure as tolerated supine with heel prop  Knee flexion overpressure with contract-relax with flexion stretches    12/30/2022: Therex: UBE UE/LE full revolution lvl 4.0 for 4 min then level 2 BLEs only for 4 min for ROM, seat 11 Leg press BLEs 75# 10 reps 2 sets 5 sec hold flex & ext; RLE only 37# 10 reps 2 sets  5 sec hold flex & ext Standing squat at sink over chair with max knee flexion to 81* 10 reps. PT demo & verbal cues on technique Gastroc stretch on incline board 30  sec 2 reps Heel raises on incline board 10 reps BUE support   Manual: Hypervolt to quad with noted decreased resistance to knee flexion and soft tissue mobs to medial knee and hamstrings.  Passive hamstring stretch during rest leg press.  Seated Rt knee flexion c mobilization c movement IR/distraction.  Contract relax c flexion stretching.   Standing on incline board with BUE support mob belt for ext. Initially with bilateral stance, then step stance with RLE in terminal stance  position.   Vasopneumatic 5 mins Rt knee medium compression 34 deg in elevation c ankle pumps throughout   TREATMENT:                                                      DATE: 12/29/2022: Therex: Nustep UE/LE lvl 5 10 mins focus on  ROM, seat 8 Leg press BLEs 75# 10 reps 2 sets 5 sec hold flex & ext; Passive hamstring stretch during rest leg press.  Seated LAQ & active knee flexion with contralateral LE opposing motion 15 reps Supine SAQ c 2-3 sec pause in end ranges x 15 Rt leg  Manual: Hypervolt to quad with noted decreased resistance to knee flexion and soft tissue mobs to medial knee and hamstrings.  Seated Rt knee flexion c mobilization c movement IR/distraction.  Contract relax c flexion stretching.   Standing with RW support mob belt for ext. Initially with bilateral stance, then step stance with RLE in terminal stance position.   Vasopneumatic 5 mins Rt knee medium compression 34 deg in elevation c ankle pumps throughout   TREATMENT:                                                      DATE: 12/28/2022: Therex: UBE UE/LE full revolution lvl 2.0 10 mins for ROM, seat 11 Seated LAQ in 90 deg  with strap assist end range c 2-3 sec pause in end ranges x 10 Rt leg Supine SAQ c 2-3 sec pause in end ranges x 15 Rt leg  Manual: Seated Rt knee flexion c mobilization c movement IR/distraction.  Contract relax c flexion stretching.   Standing with RW support mob belt for ext. Initially with bilateral stance, then step stance with RLE in terminal stance position.   Vasopneumatic 5 mins Rt knee medium compression 34 deg in elevation c ankle pumps throughout      HOME EXERCISE PROGRAM: Access Code: [email or web address] URL: [email or web address] Date: 11/23/2022 Prepared by: Vladimir Faster  Exercises - Ankle Alphabet in Elevation  - 2-4 x daily - 7 x weekly - 1 sets - 1 reps - Supine Single Leg Ankle Pumps  - 1 x daily - 5 x weekly - 1 sets - 10 reps - 5 seconds hold - Quad  Setting and Stretching  - 2-4 x daily - 7 x weekly - 5-10 sets - 10 reps - 5 sec hold - Supine Heel Slide with Strap  - 2-3 x daily - 7 x weekly - 2-3 sets - 10 reps - 5 seconds hold - Supine Knee Extension Strengthening  - 1 x daily - 7 x weekly - 2-3 sets - 10 reps - 5 seconds  hold - Supine Straight Leg Raises  - 2-3 x daily - 7 x weekly - 2-3 sets - 10 reps - 5 seconds hold - Seated Knee Flexion Extension AROM   - 2-4 x daily - 7 x weekly - 2-3 sets - 10 reps - 5 seconds hold - Seated straight leg lifts  - 2-3 x daily - 7 x weekly - 2-3 sets - 10 reps - 5 seconds hold - Seated Hamstring Stretch with Strap  - 2-4 x daily - 7 x weekly - 1 sets - 3 reps - 20-30 seconds hold - Seated Long Arc Quad  - 1 x daily - 7 x weekly - 2-3 sets - 10 reps - 5 seconds hold - Heel raises near counter  - 1 x daily - 5 x weekly - 1 sets - 10 reps - 5 seconds hold - standing calf stretch with forefoot on small step or brick  - 1 x daily - 7 x weekly - 1 sets - 3 reps - 20-30 seconds hold - Standing Knee Flexion with Counter Support  - 1 x daily - 7 x weekly - 12-3 sets - 10 reps - 5 seconds hold - Standing March with Counter Support  - 1 x daily - 7 x weekly - 2-3 sets - 10 reps - 5 seconds hold - standing knee extension  - 1 x daily - 7 x weekly - 2-3 sets - 10 reps - 5 seconds hold   ASSESSMENT: CLINICAL IMPRESSION: Pt continues to endorse general stiffness in knee, continues to feel better after manipulation. Continuing to focus on tissue extensibility today which pt tolerates well, primary report of muscular fatigue with repetition although she endorses improving stiffness as session goes on. Pt continues to demo significant stiffness, formal ROM measurements deferred although grossly comparable to most recent sessions. No adverse events. Pt departs today's session in no acute distress, all voiced questions/concerns addressed appropriately from PT perspective.    OBJECTIVE IMPAIRMENTS: Abnormal gait, decreased  activity tolerance, decreased balance, decreased coordination, decreased mobility, difficulty walking, decreased ROM, decreased strength, hypomobility, increased edema, increased fascial restrictions, impaired flexibility, impaired UE functional use, and pain.   ACTIVITY LIMITATIONS: sitting, standing, squatting, stairs, transfers, bed mobility, and locomotion level  PARTICIPATION LIMITATIONS: cleaning, laundry, driving, shopping, community activity, occupation, and yard work  PERSONAL FACTORS: Age, Behavior pattern, Education, Fitness, Past/current experiences, Social background, and Time since onset of injury/illness/exacerbation are also affecting patient's functional outcome.   REHAB POTENTIAL: Good  CLINICAL DECISION MAKING: Stable/uncomplicated  EVALUATION COMPLEXITY: Low   GOALS: Goals reviewed with patient? Yes  SHORT TERM GOALS: Target date: 01/15/2023   Will be compliant with appropriate progressive HEP  Baseline: Goal status: ongoing 12/23/2022  2.  R knee PROM ext -5* and flexion 100* Baseline:  Goal status: ongoing 12/23/2022  3.  Will be independent with management techniques for localized edema including wearing compression stockings and appropriate ice routine  Baseline:  Goal status: ongoing 12/23/2022   4.  Patient ambulates with cane with knee ext in stance & flexion in swing within 75% of normal range. Baseline:  Goal status: ongoing 12/23/2022   LONG TERM GOALS: Target date: 02/04/2023    MMT right knee >80% of LLE with HH dynameter Baseline:  Goal status: ongoing 12/23/2022  2.  R knee flexion AROM to be at least 105 degrees  Baseline:  Goal status: ongoing 12/23/2022  3.  Will be able to ascend/descend steps with single rail and reciprocal pattern, no  increase in pain  Baseline:  Goal status: ongoing 12/23/2022  4.  Will complete 5x sit to stand test in 15 seconds or less no UEs  Baseline:  Goal status: ongoing 12/23/2022  5.  Will complete TUG test in 13  seconds or less with LRAD to show improved functional balance Baseline:  Goal status: ongoing 12/23/2022  6.  Will ambulate > 578ft with cane & neg ramp / curb modified independent to show improved mobility and community access  Baseline:  Goal status:ongoing 12/23/2022   PLAN:  PT FREQUENCY: 2-5x/wk (daily when clinic open for 2 weeks post manipulation)  PT DURATION: 6 weeks  PLANNED INTERVENTIONS: Therapeutic exercises, Therapeutic activity, Neuromuscular re-education, Balance training, Gait training, Patient/Family education, Self Care, Joint mobilization, Stair training, Orthotic/Fit training, DME instructions, Aquatic Therapy, Dry Needling, Electrical stimulation, Cryotherapy, Moist heat, Taping, Vasopneumatic device, Ultrasound, Ionotophoresis 4mg /ml Dexamethasone, Manual therapy, and Re-evaluation  PLAN FOR NEXT SESSION: ongoing daily PT when clinic is open following mobilization under anesthesia on 6/6.  Continued focus on tissue extensibility   Ashley Murrain PT, DPT 01/01/2023 1:42 PM

## 2022-12-31 NOTE — Therapy (Signed)
OUTPATIENT PHYSICAL THERAPY TREATMENT   Patient Name: KAITLAND LABELLE MRN: [id number] DOB:29-Jan-1941, 82 y.o., female Today's Date: 12/31/2022  END OF SESSION:  PT End of Session - 12/31/22 1308     Visit Number 19    Number of Visits 40    Date for PT Re-Evaluation 02/04/23    Authorization Type MCR and BCBS [place] - [place]    Authorization Time Period 11/20/22 to 01/15/23    Progress Note Due on Visit 24    PT Start Time 1300    PT Stop Time 1341    PT Time Calculation (min) 41 min    Activity Tolerance Patient tolerated treatment well    Behavior During Therapy [facility] for tasks assessed/performed                         Past Medical History:  Diagnosis Date   Arthritis    Chronic back pain    lumbar stenosis   Dysrhythmia    Early cataracts, bilateral    GERD (gastroesophageal reflux disease)    occasionally but no meds required   History of blood transfusion    History of colon polyps    Hyperlipidemia    takes Lipitor every other day   Hypertension    takes losartan daily   Neuropathy    Osteoarthritis of left knee 04/27/2014   Restless leg    Shortness of breath    with exertion   SOB (shortness of breath) 2013   CPET   Tear of medial meniscus of left knee 09/08/2013   Past Surgical History:  Procedure Laterality Date   APPENDECTOMY     BACK SURGERY  07/20/2008   lumb fusion   COLONOSCOPY  2018   EYE SURGERY     both cataracts   KNEE ARTHROSCOPY WITH MEDIAL MENISECTOMY Left 09/08/2013   Procedure: LEFT KNEE ARTHROSCOPY WITH PARTIAL MEDIAL MENISCECTOMY;  Surgeon: Eulas Post, MD;  Location: [facility];  Service: Orthopedics;  Laterality: Left;   LUMBAR LAMINECTOMY/DECOMPRESSION MICRODISCECTOMY  05/13/2012   Procedure: LUMBAR LAMINECTOMY/DECOMPRESSION MICRODISCECTOMY 1 LEVEL;  Surgeon: Barnett Abu, MD;  Location: [facility];  Service: Neurosurgery;  Laterality: Bilateral;  Bilateral Lumbar one -two Decompressive Laminectomy    PARTIAL KNEE ARTHROPLASTY Left 04/27/2014   Procedure: LEFT UNICOMPARTMENTAL KNEE;  Surgeon: Eulas Post, MD;  Location: [facility];  Service: Orthopedics;  Laterality: Left;   TOTAL KNEE ARTHROPLASTY Right 11/06/2022   Procedure: RIGHT TOTAL KNEE ARTHROPLASTY;  Surgeon: Kathryne Hitch, MD;  Location: [facility];  Service: Orthopedics;  Laterality: Right;   TUBAL LIGATION  1974   UPPER GI ENDOSCOPY     Patient Active Problem List   Diagnosis Date Noted   Status post total right knee replacement 11/06/2022   Arthritis of right acromioclavicular joint 08/28/2021   Impingement syndrome of right shoulder 08/19/2021   Nontraumatic incomplete tear of right rotator cuff 08/19/2021   Paresthesia 12/05/2020   Low back pain with right-sided sciatica 12/05/2020   Trochanteric bursitis, right hip 04/24/2020   Essential hypertension 01/12/2020   Sinus bradycardia 01/12/2020   Dyspnea on exertion 01/12/2020   Lumbar stenosis with neurogenic claudication 02/01/2017   Osteoarthritis of left knee 04/27/2014    PCP: Merri Brunette MD   REFERRING PROVIDER: Kathryne Hitch, MD  REFERRING DIAG: [phone] (ICD-10-CM) - Status post total right knee replacement M17.11 (ICD-10-CM) - Unilateral primary osteoarthritis, right knee  THERAPY DIAG:  Stiffness of  right knee, not elsewhere classified  Muscle weakness (generalized)  Acute pain of right knee  Localized edema  Difficulty in walking, not elsewhere classified  Rationale for Evaluation and Treatment: Rehabilitation  ONSET DATE: 11/09/2022  SUBJECTIVE:   SUBJECTIVE STATEMENT:  Feeling "seized up" today in my knee and in my back   PERTINENT HISTORY: Hospital Course: LEIDY BODIN is an 82 y.o. female who was admitted 11/06/2022 for operative treatment ofUnilateral primary osteoarthritis, right knee. Patient has severe unremitting pain that affects sleep, daily activities, and work/hobbies. After pre-op  clearance the patient was taken to the operating room on 11/06/2022 and underwent  Procedure(s): RIGHT TOTAL KNEE ARTHROPLASTY.   PMH: OA, right rotator cuff tear, LBP w/ sciatica, lumbar stenosis, back surgery lumbar fusion & discectomy,  right hip bursitis, HEN, bradycardia,    PAIN:  NPRS scale: 0/10 (stiffness) Pain location: Rt knee  Pain description: sharp  Aggravating factors: doing too much the knee  Relieving factors: unsure, maybe ice   PRECAUTIONS: None  WEIGHT BEARING RESTRICTIONS: No  FALLS:  Has patient fallen in last 6 months? No  LIVING ENVIRONMENT: Lives with: lives with their spouse Lives in: House/apartment Stairs: 4 STE B rails, split level home with 6 steps up/6 steps down U rail  Has following equipment at home: Single point cane, Walker - 2 wheeled, and toilet raiser   OCCUPATION: retired   PLOF: Independent, Independent with basic ADLs, Independent with gait, and Independent with transfers  PATIENT GOALS: be able to walk without pain  NEXT MD VISIT: manipulation 12/24/2022  OBJECTIVE:   PATIENT SURVEYS:  12/29/2022: visit 17 45%  12/15/22 visit 10: 41%  11/20/2022 FOTO 55, predicted 61  COGNITION: 11/20/2022 Overall cognitive status:  possible STM impairment, family not present to confirm    SENSATION: 11/20/2022 Not tested  EDEMA:  11/20/2022  Some excessive pitting edema in surgical calf, no temperature or color changes, Homan's negative; on daily Xarelto per her report and per chart, she tells me she has been compliant with this medication   LOWER EXTREMITY ROM:  Active ROM Right eval Right 11/23/22 Right  11/30/22 Right 12/07/22 Right 12/15/22 Right 12/18/22 Right 12/24/22 S/p manipulation Right 12/28/22 Right 12/30/22 Right 12/31/22   Hip flexion            Hip extension            Hip abduction            Hip adduction            Hip internal rotation            Hip external rotation            Knee flexion 64* supine  Seated P: 81*  Seated 64-71* PROM and AAROM Seated A: 71* P: 82* Supine  A: 63*, AAROM in sitting 62*, passive 74* Seated AROM in knee flexion stretch 83* Supine P: 94*  Supine  With LE  94* Seated P: 95* AAROM 96* seated  Knee extension 16* supine with heel prop  Supine With heel prop P: -14* Seated with foot supported on stool 9* PROM Supine  A: Quad set -7* Supine with towel roll under heel 22*, seated PROM 12*  Supine P: -9* Standing P with mob belt -10* Standing with mob belt P: -3*   Ankle dorsiflexion            Ankle plantarflexion            Ankle inversion  Ankle eversion             (Blank rows = not tested)  LOWER EXTREMITY MMT:  MMT Right eval Left eval Right 12/15/22 Left 12/15/22  Hip flexion 3+ 4+ 4+ 4+  Hip extension      Hip abduction      Hip adduction      Hip internal rotation      Hip external rotation      Knee flexion 4 4 4- 4-  Knee extension 2 4 4 5   Ankle dorsiflexion 5 5    Ankle plantarflexion      Ankle inversion      Ankle eversion       (Blank rows = not tested)    FUNCTIONAL TESTS:  11/20/2022 5 times sit to stand: 43 seconds no UEs; 12/15/22- 15.5 seconds no UEs, severe offshift from R LE/compensations Timed up and go (TUG): 19.5 seconds SPC; 12/15/22- 14.7 seconds SPC   3 minute walk test: 336ft SPC; 12/15/22- 515ft SPC    GAIT: 12/23/2022: pt amb with cane with antalgic pattern with knee flexion in stance and minimal increase flexion for swing.   11/20/2022 / Evaluation: Distance walked: 346ft Assistive device utilized: Single point cane Level of assistance: Modified independence Comments: slow but steady, able to make sharp turns well at each end of walking course, used SPC on wrong side, foot flat and limited heel-toe pattern, limited R knee flexion/extension in stance/swing phases                     TODAY'S TREATMENT:                                                      DATE:     12/31/22  TherEx  Scifit bike UE/LE seat 11  full revolutions x4 minutes, then 4 minutes at seat 10 HS stretches supine 3x30 seconds  Knee extension stretch with ice pack for overpressure x5 minutes     Manual  Patella mobilizations all directions grade III  Tennis ball massage to quad, gave pt tennis ball and instructions for home use  Knee extension over pressure as tolerated supine with heel prop  Knee flexion overpressure with contract-relax with flexion stretches    12/30/2022: Therex: UBE UE/LE full revolution lvl 4.0 for 4 min then level 2 BLEs only for 4 min for ROM, seat 11 Leg press BLEs 75# 10 reps 2 sets 5 sec hold flex & ext; RLE only 37# 10 reps 2 sets  5 sec hold flex & ext Standing squat at sink over chair with max knee flexion to 81* 10 reps. PT demo & verbal cues on technique Gastroc stretch on incline board 30 sec 2 reps Heel raises on incline board 10 reps BUE support   Manual: Hypervolt to quad with noted decreased resistance to knee flexion and soft tissue mobs to medial knee and hamstrings.  Passive hamstring stretch during rest leg press.  Seated Rt knee flexion c mobilization c movement IR/distraction.  Contract relax c flexion stretching.   Standing on incline board with BUE support mob belt for ext. Initially with bilateral stance, then step stance with RLE in terminal stance position.   Vasopneumatic 5 mins Rt knee medium compression 34 deg in elevation c ankle pumps throughout   TREATMENT:  DATE: 12/29/2022: Therex: Nustep UE/LE lvl 5 10 mins focus on  ROM, seat 8 Leg press BLEs 75# 10 reps 2 sets 5 sec hold flex & ext; Passive hamstring stretch during rest leg press.  Seated LAQ & active knee flexion with contralateral LE opposing motion 15 reps Supine SAQ c 2-3 sec pause in end ranges x 15 Rt leg  Manual: Hypervolt to quad with noted decreased resistance to knee flexion and soft tissue mobs to medial knee and hamstrings.  Seated Rt knee  flexion c mobilization c movement IR/distraction.  Contract relax c flexion stretching.   Standing with RW support mob belt for ext. Initially with bilateral stance, then step stance with RLE in terminal stance position.   Vasopneumatic 5 mins Rt knee medium compression 34 deg in elevation c ankle pumps throughout   TREATMENT:                                                      DATE: 12/28/2022: Therex: UBE UE/LE full revolution lvl 2.0 10 mins for ROM, seat 11 Seated LAQ in 90 deg  with strap assist end range c 2-3 sec pause in end ranges x 10 Rt leg Supine SAQ c 2-3 sec pause in end ranges x 15 Rt leg  Manual: Seated Rt knee flexion c mobilization c movement IR/distraction.  Contract relax c flexion stretching.   Standing with RW support mob belt for ext. Initially with bilateral stance, then step stance with RLE in terminal stance position.   Vasopneumatic 5 mins Rt knee medium compression 34 deg in elevation c ankle pumps throughout      HOME EXERCISE PROGRAM: Access Code: [email or web address] URL: [email or web address] Date: 11/23/2022 Prepared by: Vladimir Faster  Exercises - Ankle Alphabet in Elevation  - 2-4 x daily - 7 x weekly - 1 sets - 1 reps - Supine Single Leg Ankle Pumps  - 1 x daily - 5 x weekly - 1 sets - 10 reps - 5 seconds hold - Quad Setting and Stretching  - 2-4 x daily - 7 x weekly - 5-10 sets - 10 reps - 5 sec hold - Supine Heel Slide with Strap  - 2-3 x daily - 7 x weekly - 2-3 sets - 10 reps - 5 seconds hold - Supine Knee Extension Strengthening  - 1 x daily - 7 x weekly - 2-3 sets - 10 reps - 5 seconds hold - Supine Straight Leg Raises  - 2-3 x daily - 7 x weekly - 2-3 sets - 10 reps - 5 seconds hold - Seated Knee Flexion Extension AROM   - 2-4 x daily - 7 x weekly - 2-3 sets - 10 reps - 5 seconds hold - Seated straight leg lifts  - 2-3 x daily - 7 x weekly - 2-3 sets - 10 reps - 5 seconds hold - Seated Hamstring Stretch with Strap  - 2-4 x daily - 7 x  weekly - 1 sets - 3 reps - 20-30 seconds hold - Seated Long Arc Quad  - 1 x daily - 7 x weekly - 2-3 sets - 10 reps - 5 seconds hold - Heel raises near counter  - 1 x daily - 5 x weekly - 1 sets - 10 reps - 5 seconds hold - standing calf stretch with forefoot on small step  or brick  - 1 x daily - 7 x weekly - 1 sets - 3 reps - 20-30 seconds hold - Standing Knee Flexion with Counter Support  - 1 x daily - 7 x weekly - 12-3 sets - 10 reps - 5 seconds hold - Standing March with Counter Support  - 1 x daily - 7 x weekly - 2-3 sets - 10 reps - 5 seconds hold - standing knee extension  - 1 x daily - 7 x weekly - 2-3 sets - 10 reps - 5 seconds hold   ASSESSMENT: CLINICAL IMPRESSION:  Pt arrives today doing well, still just feeling really stiff after her manipulation last week. Continued with focus on ROM based functional exercise and manual techniques today. Tolerated progression of all exercises well, ROM continues to be limited but regardless has been improving significantly as compared to pre-manipulation. Did not see f/u visit with Dr. Magnus Ivan in the chart, encouraged her to stop by the front desk and schedule this.   OBJECTIVE IMPAIRMENTS: Abnormal gait, decreased activity tolerance, decreased balance, decreased coordination, decreased mobility, difficulty walking, decreased ROM, decreased strength, hypomobility, increased edema, increased fascial restrictions, impaired flexibility, impaired UE functional use, and pain.   ACTIVITY LIMITATIONS: sitting, standing, squatting, stairs, transfers, bed mobility, and locomotion level  PARTICIPATION LIMITATIONS: cleaning, laundry, driving, shopping, community activity, occupation, and yard work  PERSONAL FACTORS: Age, Behavior pattern, Education, Fitness, Past/current experiences, Social background, and Time since onset of injury/illness/exacerbation are also affecting patient's functional outcome.   REHAB POTENTIAL: Good  CLINICAL DECISION MAKING:  Stable/uncomplicated  EVALUATION COMPLEXITY: Low   GOALS: Goals reviewed with patient? Yes  SHORT TERM GOALS: Target date: 01/15/2023   Will be compliant with appropriate progressive HEP  Baseline: Goal status: ongoing 12/23/2022  2.  R knee PROM ext -5* and flexion 100* Baseline:  Goal status: ongoing 12/23/2022  3.  Will be independent with management techniques for localized edema including wearing compression stockings and appropriate ice routine  Baseline:  Goal status: ongoing 12/23/2022   4.  Patient ambulates with cane with knee ext in stance & flexion in swing within 75% of normal range. Baseline:  Goal status: ongoing 12/23/2022   LONG TERM GOALS: Target date: 02/04/2023    MMT right knee >80% of LLE with HH dynameter Baseline:  Goal status: ongoing 12/23/2022  2.  R knee flexion AROM to be at least 105 degrees  Baseline:  Goal status: ongoing 12/23/2022  3.  Will be able to ascend/descend steps with single rail and reciprocal pattern, no increase in pain  Baseline:  Goal status: ongoing 12/23/2022  4.  Will complete 5x sit to stand test in 15 seconds or less no UEs  Baseline:  Goal status: ongoing 12/23/2022  5.  Will complete TUG test in 13 seconds or less with LRAD to show improved functional balance Baseline:  Goal status: ongoing 12/23/2022  6.  Will ambulate > 567ft with cane & neg ramp / curb modified independent to show improved mobility and community access  Baseline:  Goal status:ongoing 12/23/2022   PLAN:  PT FREQUENCY: 2-5x/wk (daily when clinic open for 2 weeks post manipulation)  PT DURATION: 6 weeks  PLANNED INTERVENTIONS: Therapeutic exercises, Therapeutic activity, Neuromuscular re-education, Balance training, Gait training, Patient/Family education, Self Care, Joint mobilization, Stair training, Orthotic/Fit training, DME instructions, Aquatic Therapy, Dry Needling, Electrical stimulation, Cryotherapy, Moist heat, Taping, Vasopneumatic device,  Ultrasound, Ionotophoresis 4mg /ml Dexamethasone, Manual therapy, and Re-evaluation  PLAN FOR NEXT SESSION: ongoing daily PT when  clinic is open following mobilization under anesthesia on 6/6.  heavy focus on manual therapy including soft tissue work for mobility, progressive strengthening return. Did she make f/u with Dr. Magnus Ivan?    Nedra Hai PT DPT PN2

## 2022-12-31 NOTE — Telephone Encounter (Signed)
Patient was here for PT. Needs a PO visit with Bronson Curb or Dr. Magnus Ivan. No openings.

## 2023-01-01 ENCOUNTER — Encounter: Payer: Self-pay | Admitting: Physical Therapy

## 2023-01-01 ENCOUNTER — Ambulatory Visit (INDEPENDENT_AMBULATORY_CARE_PROVIDER_SITE_OTHER): Payer: Medicare Other | Admitting: Physical Therapy

## 2023-01-01 DIAGNOSIS — R600 Localized edema: Secondary | ICD-10-CM

## 2023-01-01 DIAGNOSIS — M6281 Muscle weakness (generalized): Secondary | ICD-10-CM | POA: Diagnosis not present

## 2023-01-01 DIAGNOSIS — M25561 Pain in right knee: Secondary | ICD-10-CM | POA: Diagnosis not present

## 2023-01-01 DIAGNOSIS — R262 Difficulty in walking, not elsewhere classified: Secondary | ICD-10-CM

## 2023-01-01 DIAGNOSIS — M25661 Stiffness of right knee, not elsewhere classified: Secondary | ICD-10-CM | POA: Diagnosis not present

## 2023-01-01 NOTE — Telephone Encounter (Signed)
Patient scheduled and aware

## 2023-01-04 ENCOUNTER — Ambulatory Visit (INDEPENDENT_AMBULATORY_CARE_PROVIDER_SITE_OTHER): Payer: Medicare Other | Admitting: Physical Therapy

## 2023-01-04 ENCOUNTER — Encounter: Payer: Self-pay | Admitting: Physical Therapy

## 2023-01-04 DIAGNOSIS — R262 Difficulty in walking, not elsewhere classified: Secondary | ICD-10-CM | POA: Diagnosis not present

## 2023-01-04 DIAGNOSIS — R600 Localized edema: Secondary | ICD-10-CM

## 2023-01-04 DIAGNOSIS — M25661 Stiffness of right knee, not elsewhere classified: Secondary | ICD-10-CM | POA: Diagnosis not present

## 2023-01-04 DIAGNOSIS — M25561 Pain in right knee: Secondary | ICD-10-CM

## 2023-01-04 DIAGNOSIS — M6281 Muscle weakness (generalized): Secondary | ICD-10-CM

## 2023-01-04 NOTE — Therapy (Signed)
OUTPATIENT PHYSICAL THERAPY TREATMENT   Patient Name: Melissa Maldonado MRN: [id number] DOB:1940-11-16, 82 y.o., female Today's Date: 01/04/2023  END OF SESSION:  PT End of Session - 01/04/23 1057     Visit Number 21    Number of Visits 40    Authorization Type MCR and BCBS [place] - [place]    Authorization Time Period 11/20/22 to 01/15/23    Progress Note Due on Visit 24    PT Start Time 1057    PT Stop Time 1151    PT Time Calculation (min) 54 min    Activity Tolerance Patient tolerated treatment well    Behavior During Therapy WFL for tasks assessed/performed                Past Medical History:  Diagnosis Date   Arthritis    Chronic back pain    lumbar stenosis   Dysrhythmia    Early cataracts, bilateral    GERD (gastroesophageal reflux disease)    occasionally but no meds required   History of blood transfusion    History of colon polyps    Hyperlipidemia    takes Lipitor every other day   Hypertension    takes losartan daily   Neuropathy    Osteoarthritis of left knee 04/27/2014   Restless leg    Shortness of breath    with exertion   SOB (shortness of breath) 2013   CPET   Tear of medial meniscus of left knee 09/08/2013   Past Surgical History:  Procedure Laterality Date   APPENDECTOMY     BACK SURGERY  07/20/2008   lumb fusion   COLONOSCOPY  2018   EYE SURGERY     both cataracts   KNEE ARTHROSCOPY WITH MEDIAL MENISECTOMY Left 09/08/2013   Procedure: LEFT KNEE ARTHROSCOPY WITH PARTIAL MEDIAL MENISCECTOMY;  Surgeon: Eulas Post, MD;  Location: [facility];  Service: Orthopedics;  Laterality: Left;   LUMBAR LAMINECTOMY/DECOMPRESSION MICRODISCECTOMY  05/13/2012   Procedure: LUMBAR LAMINECTOMY/DECOMPRESSION MICRODISCECTOMY 1 LEVEL;  Surgeon: Barnett Abu, MD;  Location: [facility];  Service: Neurosurgery;  Laterality: Bilateral;  Bilateral Lumbar one -two Decompressive Laminectomy   PARTIAL KNEE ARTHROPLASTY Left 04/27/2014   Procedure:  LEFT UNICOMPARTMENTAL KNEE;  Surgeon: Eulas Post, MD;  Location: [facility];  Service: Orthopedics;  Laterality: Left;   TOTAL KNEE ARTHROPLASTY Right 11/06/2022   Procedure: RIGHT TOTAL KNEE ARTHROPLASTY;  Surgeon: Kathryne Hitch, MD;  Location: [facility];  Service: Orthopedics;  Laterality: Right;   TUBAL LIGATION  1974   UPPER GI ENDOSCOPY     Patient Active Problem List   Diagnosis Date Noted   Status post total right knee replacement 11/06/2022   Arthritis of right acromioclavicular joint 08/28/2021   Impingement syndrome of right shoulder 08/19/2021   Nontraumatic incomplete tear of right rotator cuff 08/19/2021   Paresthesia 12/05/2020   Low back pain with right-sided sciatica 12/05/2020   Trochanteric bursitis, right hip 04/24/2020   Essential hypertension 01/12/2020   Sinus bradycardia 01/12/2020   Dyspnea on exertion 01/12/2020   Lumbar stenosis with neurogenic claudication 02/01/2017   Osteoarthritis of left knee 04/27/2014    PCP: Merri Brunette MD   REFERRING PROVIDER: Kathryne Hitch, MD  REFERRING DIAG: [phone] (ICD-10-CM) - Status post total right knee replacement M17.11 (ICD-10-CM) - Unilateral primary osteoarthritis, right knee  THERAPY DIAG:  Stiffness of right knee, not elsewhere classified  Muscle weakness (generalized)  Acute pain of right knee  Localized  edema  Difficulty in walking, not elsewhere classified  Rationale for Evaluation and Treatment: Rehabilitation  ONSET DATE: 11/09/2022  SUBJECTIVE:   SUBJECTIVE STATEMENT: She did her exercises all weekend. Her knee was very stiff.    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 Patient has severe unremitting pain that affects sleep, daily activities, and work/hobbies. After pre-op clearance the patient was taken to the operating room on  11/06/2022 and underwent  Procedure(s): RIGHT TOTAL KNEE ARTHROPLASTY.   PMH: OA, right rotator cuff tear, LBP w/ sciatica, lumbar stenosis, back surgery lumbar fusion & discectomy,  right hip bursitis, HEN, bradycardia,    PAIN:  NPRS scale: 5/10 (stiffness) Pain location: Rt knee  Pain description: sharp  Aggravating factors: doing too much the knee  Relieving factors: unsure, maybe ice   PRECAUTIONS: None  WEIGHT BEARING RESTRICTIONS: No  FALLS:  Has patient fallen in last 6 months? No  LIVING ENVIRONMENT: Lives with: lives with their spouse Lives in: House/apartment Stairs: 4 STE B rails, split level home with 6 steps up/6 steps down U rail  Has following equipment at home: Single point cane, Walker - 2 wheeled, and toilet raiser   OCCUPATION: retired   PLOF: Independent, Independent with basic ADLs, Independent with gait, and Independent with transfers  PATIENT GOALS: be able to walk without pain  NEXT MD VISIT: manipulation 12/24/2022  OBJECTIVE: (objective measures completed at initial evaluation unless otherwise dated)   PATIENT SURVEYS:  12/29/2022: visit 17 45%  12/15/22 visit 10: 41%  11/20/2022 FOTO 55, predicted 61  COGNITION: 11/20/2022 Overall cognitive status:  possible STM impairment, family not present to confirm    SENSATION: 11/20/2022 Not tested  EDEMA:  11/20/2022  Some excessive pitting edema in surgical calf, no temperature or color changes, Homan's negative; on daily Xarelto per her report and per chart, she tells me she has been compliant with this medication   LOWER EXTREMITY ROM:  Active ROM Right eval Right 11/23/22 Right  11/30/22 Right 12/07/22 Right 12/15/22 Right 12/18/22 Right 12/24/22 S/p manipulation Right 12/28/22 Right 12/30/22 Right 12/31/22  Right 01/04/23  Hip flexion             Hip extension             Hip abduction             Hip adduction             Hip internal rotation             Hip external rotation              Knee flexion 64* supine  Seated P: 81* Seated 64-71* PROM and AAROM Seated A: 71* P: 82* Supine  A: 63*, AAROM in sitting 62*, passive 74* Seated AROM in knee flexion stretch 83* Supine P: 94*  Supine  With LE  94* Seated P: 95* AAROM 96* seated PROM  Seated 96* End of session  Knee extension 16* supine with heel prop  Supine With heel prop P: -14* Seated with foot supported on stool 9* PROM Supine  A: Quad set -7* Supine with towel roll under heel 22*, seated PROM 12*  Supine P: -9* Standing P with mob belt -10* Standing with mob belt P: -3*  Standing with mob belt P: -3*  Ankle dorsiflexion             Ankle  plantarflexion             Ankle inversion             Ankle eversion              (Blank rows = not tested)  LOWER EXTREMITY MMT:  MMT Right eval Left eval Right 12/15/22 Left 12/15/22  Hip flexion 3+ 4+ 4+ 4+  Hip extension      Hip abduction      Hip adduction      Hip internal rotation      Hip external rotation      Knee flexion 4 4 4- 4-  Knee extension 2 4 4 5   Ankle dorsiflexion 5 5    Ankle plantarflexion      Ankle inversion      Ankle eversion       (Blank rows = not tested)    FUNCTIONAL TESTS:  11/20/2022 5 times sit to stand: 43 seconds no UEs; 12/15/22- 15.5 seconds no UEs, severe offshift from R LE/compensations Timed up and go (TUG): 19.5 seconds SPC; 12/15/22- 14.7 seconds SPC   3 minute walk test: 322ft SPC; 12/15/22- 567ft SPC    GAIT: 12/23/2022: pt amb with cane with antalgic pattern with knee flexion in stance and minimal increase flexion for swing.   11/20/2022 / Evaluation: Distance walked: 35ft Assistive device utilized: Single point cane Level of assistance: Modified independence Comments: slow but steady, able to make sharp turns well at each end of walking course, used SPC on wrong side, foot flat and limited heel-toe pattern, limited R knee flexion/extension in stance/swing phases                     TODAY'S TREATMENT:                                                DATE: 01/04/2023 Therex: UBE UE/LE full revolution lvl 4.0 for 4 min then level 2 BLEs only for 4 min for ROM, seat 11 Leg press BLEs 81# 10 reps 2 sets 5 sec hold flex & ext; RLE only 37# 10 reps 2 sets  5 sec hold flex & ext Seated LAQ & active knee flexion 3 sec hold for 3 min.    Manual: Hypervolt to quad with noted decreased resistance to knee flexion and soft tissue mobs to medial knee and hamstrings.  Passive hamstring stretch during rest leg press.  Seated Rt knee flexion c mobilization c movement IR/distraction.  Contract relax c flexion stretching.   Standing on incline board with BUE support mob belt for ext.  Instrument assisted soft tissue mobs to hamstring tendons, knee joint and incision.  Her incision was very sensitive.   Vasopneumatic 10 mins Rt knee medium compression 34 deg in elevation c ankle pumps throughout    TREATMENT:      [facility] Adult PT Treatment:                                                DATE: 01/01/23 Therapeutic Exercise: Scifit bike LE/UE full revolutions (4 min seat at 11, 4 min seat at 10 for inc ROM) Long sitting (with support) quad set 2x12 cues for  full ROM and breath control LAQ seated 2x8 cues for DF and full ROM, second set PT assist for inc ROM  Seated heel slides w/ strap and towel 2x8 RLE cues for ROM Manual Therapy: Seated edge of met; passive physiological movement flex/ext knee to pt tolerance, oscillations to reduce muscle guarding, gentle distraction at knee joint     12/31/22 TherEx Scifit bike UE/LE seat 11 full revolutions x4 minutes, then 4 minutes at seat 10 HS stretches supine 3x30 seconds  Knee extension stretch with ice pack for overpressure x5 minutes   Manual Patella mobilizations all directions grade III  Tennis ball massage to quad, gave pt tennis ball and instructions for home use  Knee extension over pressure as tolerated supine with heel prop  Knee flexion  overpressure with contract-relax with flexion stretches    12/30/2022: Therex: UBE UE/LE full revolution lvl 4.0 for 4 min then level 2 BLEs only for 4 min for ROM, seat 11 Leg press BLEs 75# 10 reps 2 sets 5 sec hold flex & ext; RLE only 37# 10 reps 2 sets  5 sec hold flex & ext Standing squat at sink over chair with max knee flexion to 81* 10 reps. PT demo & verbal cues on technique Gastroc stretch on incline board 30 sec 2 reps Heel raises on incline board 10 reps BUE support   Manual: Hypervolt to quad with noted decreased resistance to knee flexion and soft tissue mobs to medial knee and hamstrings.  Passive hamstring stretch during rest leg press.  Seated Rt knee flexion c mobilization c movement IR/distraction.  Contract relax c flexion stretching.   Standing on incline board with BUE support mob belt for ext. Initially with bilateral stance, then step stance with RLE in terminal stance position.   Vasopneumatic 5 mins Rt knee medium compression 34 deg in elevation c ankle pumps throughout     HOME EXERCISE PROGRAM: Access Code: [email or web address] URL: [email or web address] Date: 11/23/2022 Prepared by: Vladimir Faster  Exercises - Ankle Alphabet in Elevation  - 2-4 x daily - 7 x weekly - 1 sets - 1 reps - Supine Single Leg Ankle Pumps  - 1 x daily - 5 x weekly - 1 sets - 10 reps - 5 seconds hold - Quad Setting and Stretching  - 2-4 x daily - 7 x weekly - 5-10 sets - 10 reps - 5 sec hold - Supine Heel Slide with Strap  - 2-3 x daily - 7 x weekly - 2-3 sets - 10 reps - 5 seconds hold - Supine Knee Extension Strengthening  - 1 x daily - 7 x weekly - 2-3 sets - 10 reps - 5 seconds hold - Supine Straight Leg Raises  - 2-3 x daily - 7 x weekly - 2-3 sets - 10 reps - 5 seconds hold - Seated Knee Flexion Extension AROM   - 2-4 x daily - 7 x weekly - 2-3 sets - 10 reps - 5 seconds hold - Seated straight leg lifts  - 2-3 x daily - 7 x weekly - 2-3 sets - 10 reps - 5 seconds  hold - Seated Hamstring Stretch with Strap  - 2-4 x daily - 7 x weekly - 1 sets - 3 reps - 20-30 seconds hold - Seated Long Arc Quad  - 1 x daily - 7 x weekly - 2-3 sets - 10 reps - 5 seconds hold - Heel raises near counter  - 1 x daily - 5 x weekly - 1  sets - 10 reps - 5 seconds hold - standing calf stretch with forefoot on small step or brick  - 1 x daily - 7 x weekly - 1 sets - 3 reps - 20-30 seconds hold - Standing Knee Flexion with Counter Support  - 1 x daily - 7 x weekly - 12-3 sets - 10 reps - 5 seconds hold - Standing March with Counter Support  - 1 x daily - 7 x weekly - 2-3 sets - 10 reps - 5 seconds hold - standing knee extension  - 1 x daily - 7 x weekly - 2-3 sets - 10 reps - 5 seconds hold   ASSESSMENT: CLINICAL IMPRESSION: Patient continues to be limited in range more by pain.  Her tissue seems to tighten between PT sessions in spite of pt reporting compliance with HEP.   OBJECTIVE IMPAIRMENTS: Abnormal gait, decreased activity tolerance, decreased balance, decreased coordination, decreased mobility, difficulty walking, decreased ROM, decreased strength, hypomobility, increased edema, increased fascial restrictions, impaired flexibility, impaired UE functional use, and pain.   ACTIVITY LIMITATIONS: sitting, standing, squatting, stairs, transfers, bed mobility, and locomotion level  PARTICIPATION LIMITATIONS: cleaning, laundry, driving, shopping, community activity, occupation, and yard work  PERSONAL FACTORS: Age, Behavior pattern, Education, Fitness, Past/current experiences, Social background, and Time since onset of injury/illness/exacerbation are also affecting patient's functional outcome.   REHAB POTENTIAL: Good  CLINICAL DECISION MAKING: Stable/uncomplicated  EVALUATION COMPLEXITY: Low   GOALS: Goals reviewed with patient? Yes  SHORT TERM GOALS: Target date: 01/15/2023   Will be compliant with appropriate progressive HEP  Baseline: Goal status: ongoing  12/23/2022  2.  R knee PROM ext -5* and flexion 100* Baseline:  Goal status: ongoing 12/23/2022  3.  Will be independent with management techniques for localized edema including wearing compression stockings and appropriate ice routine  Baseline:  Goal status: ongoing 12/23/2022   4.  Patient ambulates with cane with knee ext in stance & flexion in swing within 75% of normal range. Baseline:  Goal status: ongoing 12/23/2022   LONG TERM GOALS: Target date: 02/04/2023    MMT right knee >80% of LLE with HH dynameter Baseline:  Goal status: ongoing 12/23/2022  2.  R knee flexion AROM to be at least 105 degrees  Baseline:  Goal status: ongoing 12/23/2022  3.  Will be able to ascend/descend steps with single rail and reciprocal pattern, no increase in pain  Baseline:  Goal status: ongoing 12/23/2022  4.  Will complete 5x sit to stand test in 15 seconds or less no UEs  Baseline:  Goal status: ongoing 12/23/2022  5.  Will complete TUG test in 13 seconds or less with LRAD to show improved functional balance Baseline:  Goal status: ongoing 12/23/2022  6.  Will ambulate > 576ft with cane & neg ramp / curb modified independent to show improved mobility and community access  Baseline:  Goal status:ongoing 12/23/2022   PLAN:  PT FREQUENCY: 2-5x/wk (daily when clinic open for 2 weeks post manipulation)  PT DURATION: 6 weeks  PLANNED INTERVENTIONS: Therapeutic exercises, Therapeutic activity, Neuromuscular re-education, Balance training, Gait training, Patient/Family education, Self Care, Joint mobilization, Stair training, Orthotic/Fit training, DME instructions, Aquatic Therapy, Dry Needling, Electrical stimulation, Cryotherapy, Moist heat, Taping, Vasopneumatic device, Ultrasound, Ionotophoresis 4mg /ml Dexamethasone, Manual therapy, and Re-evaluation  PLAN FOR NEXT SESSION: continue with ongoing daily PT when clinic is open following mobilization under anesthesia on 6/6.  Continued focus on tissue  extensibility with exercises & manual therapy    Vladimir Faster,  PT, DPT 01/04/2023, 12:54 PM

## 2023-01-05 ENCOUNTER — Ambulatory Visit (INDEPENDENT_AMBULATORY_CARE_PROVIDER_SITE_OTHER): Payer: Medicare Other | Admitting: Physical Therapy

## 2023-01-05 ENCOUNTER — Encounter: Payer: Self-pay | Admitting: Physical Therapy

## 2023-01-05 DIAGNOSIS — M25661 Stiffness of right knee, not elsewhere classified: Secondary | ICD-10-CM

## 2023-01-05 DIAGNOSIS — M25561 Pain in right knee: Secondary | ICD-10-CM

## 2023-01-05 DIAGNOSIS — R262 Difficulty in walking, not elsewhere classified: Secondary | ICD-10-CM

## 2023-01-05 DIAGNOSIS — M6281 Muscle weakness (generalized): Secondary | ICD-10-CM | POA: Diagnosis not present

## 2023-01-05 DIAGNOSIS — R600 Localized edema: Secondary | ICD-10-CM

## 2023-01-05 NOTE — Therapy (Signed)
OUTPATIENT PHYSICAL THERAPY TREATMENT   Patient Name: Melissa Maldonado MRN: [id number] DOB:Jan 19, 1941, 82 y.o., female Today's Date: 01/05/2023  END OF SESSION:  PT End of Session - 01/05/23 1018     Visit Number 22    Number of Visits 40    Authorization Type MCR and BCBS [place] - [place]    Authorization Time Period 11/20/22 to 01/15/23    Progress Note Due on Visit 24    PT Start Time 1018    PT Stop Time 1105    PT Time Calculation (min) 47 min    Activity Tolerance Patient tolerated treatment well;Patient limited by pain    Behavior During Therapy [facility] for tasks assessed/performed                Past Medical History:  Diagnosis Date   Arthritis    Chronic back pain    lumbar stenosis   Dysrhythmia    Early cataracts, bilateral    GERD (gastroesophageal reflux disease)    occasionally but no meds required   History of blood transfusion    History of colon polyps    Hyperlipidemia    takes Lipitor every other day   Hypertension    takes losartan daily   Neuropathy    Osteoarthritis of left knee 04/27/2014   Restless leg    Shortness of breath    with exertion   SOB (shortness of breath) 2013   CPET   Tear of medial meniscus of left knee 09/08/2013   Past Surgical History:  Procedure Laterality Date   APPENDECTOMY     BACK SURGERY  07/20/2008   lumb fusion   COLONOSCOPY  2018   EYE SURGERY     both cataracts   KNEE ARTHROSCOPY WITH MEDIAL MENISECTOMY Left 09/08/2013   Procedure: LEFT KNEE ARTHROSCOPY WITH PARTIAL MEDIAL MENISCECTOMY;  Surgeon: Eulas Post, MD;  Location: [facility];  Service: Orthopedics;  Laterality: Left;   LUMBAR LAMINECTOMY/DECOMPRESSION MICRODISCECTOMY  05/13/2012   Procedure: LUMBAR LAMINECTOMY/DECOMPRESSION MICRODISCECTOMY 1 LEVEL;  Surgeon: Barnett Abu, MD;  Location: [facility];  Service: Neurosurgery;  Laterality: Bilateral;  Bilateral Lumbar one -two Decompressive Laminectomy   PARTIAL KNEE ARTHROPLASTY Left  04/27/2014   Procedure: LEFT UNICOMPARTMENTAL KNEE;  Surgeon: Eulas Post, MD;  Location: [facility];  Service: Orthopedics;  Laterality: Left;   TOTAL KNEE ARTHROPLASTY Right 11/06/2022   Procedure: RIGHT TOTAL KNEE ARTHROPLASTY;  Surgeon: Kathryne Hitch, MD;  Location: [facility];  Service: Orthopedics;  Laterality: Right;   TUBAL LIGATION  1974   UPPER GI ENDOSCOPY     Patient Active Problem List   Diagnosis Date Noted   Status post total right knee replacement 11/06/2022   Arthritis of right acromioclavicular joint 08/28/2021   Impingement syndrome of right shoulder 08/19/2021   Nontraumatic incomplete tear of right rotator cuff 08/19/2021   Paresthesia 12/05/2020   Low back pain with right-sided sciatica 12/05/2020   Trochanteric bursitis, right hip 04/24/2020   Essential hypertension 01/12/2020   Sinus bradycardia 01/12/2020   Dyspnea on exertion 01/12/2020   Lumbar stenosis with neurogenic claudication 02/01/2017   Osteoarthritis of left knee 04/27/2014    PCP: Merri Brunette MD   REFERRING PROVIDER: Kathryne Hitch, MD  REFERRING DIAG: [phone] (ICD-10-CM) - Status post total right knee replacement M17.11 (ICD-10-CM) - Unilateral primary osteoarthritis, right knee  THERAPY DIAG:  Stiffness of right knee, not elsewhere classified  Muscle weakness (generalized)  Acute pain of right  knee  Localized edema  Difficulty in walking, not elsewhere classified  Rationale for Evaluation and Treatment: Rehabilitation  ONSET DATE: 11/09/2022  SUBJECTIVE:   SUBJECTIVE STATEMENT: Yesterday was a rough day after PT. She thinks the soft tissue work with bar was the difference.   PERTINENT HISTORY: Hospital Course: ENEYDA BOLINSKI is an 82 y.o. female who was admitted 11/06/2022 for operative treatment ofUnilateral primary osteoarthritis, right knee. Patient has severe unremitting pain that affects sleep, daily activities, and work/hobbies. After  pre-op clearance the patient was taken to the operating room on 11/06/2022 and underwent  Procedure(s): RIGHT TOTAL KNEE ARTHROPLASTY.   PMH: OA, right rotator cuff tear, LBP w/ sciatica, lumbar stenosis, back surgery lumbar fusion & discectomy,  right hip bursitis, HEN, bradycardia,    PAIN:  NPRS scale:   1/10 (stiffness) took pill before PT Pain location: Rt knee  Pain description: sharp  Aggravating factors: doing too much the knee  Relieving factors: unsure, maybe ice   PRECAUTIONS: None  WEIGHT BEARING RESTRICTIONS: No  FALLS:  Has patient fallen in last 6 months? No  LIVING ENVIRONMENT: Lives with: lives with their spouse Lives in: House/apartment Stairs: 4 STE B rails, split level home with 6 steps up/6 steps down U rail  Has following equipment at home: Single point cane, Walker - 2 wheeled, and toilet raiser   OCCUPATION: retired   PLOF: Independent, Independent with basic ADLs, Independent with gait, and Independent with transfers  PATIENT GOALS: be able to walk without pain  NEXT MD VISIT: manipulation 12/24/2022  OBJECTIVE: (objective measures completed at initial evaluation unless otherwise dated)   PATIENT SURVEYS:  12/29/2022: visit 17 45%  12/15/22 visit 10: 41%  11/20/2022 FOTO 55, predicted 61  COGNITION: 11/20/2022 Overall cognitive status:  possible STM impairment, family not present to confirm    SENSATION: 11/20/2022 Not tested  EDEMA:  11/20/2022  Some excessive pitting edema in surgical calf, no temperature or color changes, Homan's negative; on daily Xarelto per her report and per chart, she tells me she has been compliant with this medication   LOWER EXTREMITY ROM:  Active ROM Right eval Right 11/23/22 Right  11/30/22 Right 12/07/22 Right 12/15/22 Right 12/18/22 Right 12/24/22 S/p manipulation Right 12/28/22 Right 12/30/22 Right 12/31/22  Right 01/04/23  Hip flexion             Hip extension             Hip abduction             Hip adduction              Hip internal rotation             Hip external rotation             Knee flexion 64* supine  Seated P: 81* Seated 64-71* PROM and AAROM Seated A: 71* P: 82* Supine  A: 63*, AAROM in sitting 62*, passive 74* Seated AROM in knee flexion stretch 83* Supine P: 94*  Supine  With LE  94* Seated P: 95* AAROM 96* seated PROM  Seated 96* End of session  Knee extension 16* supine with heel prop  Supine With heel prop P: -14* Seated with foot supported on stool 9* PROM Supine  A: Quad set -7* Supine with towel roll under heel 22*, seated PROM 12*  Supine P: -9* Standing P with mob belt -10* Standing with mob belt P: -3*  Standing with mob belt P: -3*  Ankle dorsiflexion             Ankle plantarflexion             Ankle inversion             Ankle eversion              (Blank rows = not tested)  LOWER EXTREMITY MMT:  MMT Right eval Left eval Right 12/15/22 Left 12/15/22  Hip flexion 3+ 4+ 4+ 4+  Hip extension      Hip abduction      Hip adduction      Hip internal rotation      Hip external rotation      Knee flexion 4 4 4- 4-  Knee extension 2 4 4 5   Ankle dorsiflexion 5 5    Ankle plantarflexion      Ankle inversion      Ankle eversion       (Blank rows = not tested)    FUNCTIONAL TESTS:  11/20/2022 5 times sit to stand: 43 seconds no UEs; 12/15/22- 15.5 seconds no UEs, severe offshift from R LE/compensations Timed up and go (TUG): 19.5 seconds SPC; 12/15/22- 14.7 seconds SPC   3 minute walk test: 353ft SPC; 12/15/22- 590ft SPC    GAIT: 12/23/2022: pt amb with cane with antalgic pattern with knee flexion in stance and minimal increase flexion for swing.   11/20/2022 / Evaluation: Distance walked: 383ft Assistive device utilized: Single point cane Level of assistance: Modified independence Comments: slow but steady, able to make sharp turns well at each end of walking course, used SPC on wrong side, foot flat and limited heel-toe pattern, limited R knee  flexion/extension in stance/swing phases                     TODAY'S TREATMENT:                                               DATE:   01/05/2023 Therex: Gastroc stretch on incline board 30 sec hold 3 reps Heel raises on incline board 15 reps 2 sets Tandem stance on foam beam 30 sec RLE in front & in back Heel slides with lower leg on ball 20 reps. PT manual assist for range 10 reps.  Seated LAQ & active knee flexion 3 sec hold for 3 min.  UBE UE/LE full revolution lvl 2.0 for 5 min for ROM, seat 11   Manual: Seated Rt knee flexion c mobilization c movement IR/distraction.  Contract relax c flexion stretching.   Standing on incline board with BUE support mob belt for ext.  Instrument assisted suction soft tissue mobs to incision.    TREATMENT:                                               DATE: 01/04/2023 Therex: UBE UE/LE full revolution lvl 4.0 for 4 min then level 2 BLEs only for 4 min for ROM, seat 11 Leg press BLEs 81# 10 reps 2 sets 5 sec hold flex & ext; RLE only 37# 10 reps 2 sets  5 sec hold flex & ext Seated LAQ & active knee flexion 3 sec hold for 3 min.  Manual: Hypervolt to quad with noted decreased resistance to knee flexion and soft tissue mobs to medial knee and hamstrings.  Passive hamstring stretch during rest leg press.  Seated Rt knee flexion c mobilization c movement IR/distraction.  Contract relax c flexion stretching.   Standing on incline board with BUE support mob belt for ext.  Instrument assisted soft tissue mobs to hamstring tendons, knee joint and incision.  Her incision was very sensitive.   Vasopneumatic 10 mins Rt knee medium compression 34 deg in elevation c ankle pumps throughout    TREATMENT:      [facility] Adult PT Treatment:                                                DATE: 01/01/23 Therapeutic Exercise: Scifit bike LE/UE full revolutions (4 min seat at 11, 4 min seat at 10 for inc ROM) Long sitting (with support) quad set 2x12 cues for  full ROM and breath control LAQ seated 2x8 cues for DF and full ROM, second set PT assist for inc ROM  Seated heel slides w/ strap and towel 2x8 RLE cues for ROM Manual Therapy: Seated edge of met; passive physiological movement flex/ext knee to pt tolerance, oscillations to reduce muscle guarding, gentle distraction at knee joint     HOME EXERCISE PROGRAM: Access Code: [email or web address] URL: [email or web address] Date: 11/23/2022 Prepared by: Vladimir Faster  Exercises - Ankle Alphabet in Elevation  - 2-4 x daily - 7 x weekly - 1 sets - 1 reps - Supine Single Leg Ankle Pumps  - 1 x daily - 5 x weekly - 1 sets - 10 reps - 5 seconds hold - Quad Setting and Stretching  - 2-4 x daily - 7 x weekly - 5-10 sets - 10 reps - 5 sec hold - Supine Heel Slide with Strap  - 2-3 x daily - 7 x weekly - 2-3 sets - 10 reps - 5 seconds hold - Supine Knee Extension Strengthening  - 1 x daily - 7 x weekly - 2-3 sets - 10 reps - 5 seconds hold - Supine Straight Leg Raises  - 2-3 x daily - 7 x weekly - 2-3 sets - 10 reps - 5 seconds hold - Seated Knee Flexion Extension AROM   - 2-4 x daily - 7 x weekly - 2-3 sets - 10 reps - 5 seconds hold - Seated straight leg lifts  - 2-3 x daily - 7 x weekly - 2-3 sets - 10 reps - 5 seconds hold - Seated Hamstring Stretch with Strap  - 2-4 x daily - 7 x weekly - 1 sets - 3 reps - 20-30 seconds hold - Seated Long Arc Quad  - 1 x daily - 7 x weekly - 2-3 sets - 10 reps - 5 seconds hold - Heel raises near counter  - 1 x daily - 5 x weekly - 1 sets - 10 reps - 5 seconds hold - standing calf stretch with forefoot on small step or brick  - 1 x daily - 7 x weekly - 1 sets - 3 reps - 20-30 seconds hold - Standing Knee Flexion with Counter Support  - 1 x daily - 7 x weekly - 12-3 sets - 10 reps - 5 seconds hold - Standing March with Counter Support  - 1 x daily - 7 x weekly -  2-3 sets - 10 reps - 5 seconds hold - standing knee extension  - 1 x daily - 7 x weekly - 2-3 sets - 10  reps - 5 seconds hold   ASSESSMENT: CLINICAL IMPRESSION: Patient continues to be limited by pain. She has fibrous feeling to scar which limits her flexion.  Patient continues to benefit from skilled PT.   OBJECTIVE IMPAIRMENTS: Abnormal gait, decreased activity tolerance, decreased balance, decreased coordination, decreased mobility, difficulty walking, decreased ROM, decreased strength, hypomobility, increased edema, increased fascial restrictions, impaired flexibility, impaired UE functional use, and pain.   ACTIVITY LIMITATIONS: sitting, standing, squatting, stairs, transfers, bed mobility, and locomotion level  PARTICIPATION LIMITATIONS: cleaning, laundry, driving, shopping, community activity, occupation, and yard work  PERSONAL FACTORS: Age, Behavior pattern, Education, Fitness, Past/current experiences, Social background, and Time since onset of injury/illness/exacerbation are also affecting patient's functional outcome.   REHAB POTENTIAL: Good  CLINICAL DECISION MAKING: Stable/uncomplicated  EVALUATION COMPLEXITY: Low   GOALS: Goals reviewed with patient? Yes  SHORT TERM GOALS: Target date: 01/15/2023   Will be compliant with appropriate progressive HEP  Baseline: Goal status: ongoing 12/23/2022  2.  R knee PROM ext -5* and flexion 100* Baseline:  Goal status: ongoing 12/23/2022  3.  Will be independent with management techniques for localized edema including wearing compression stockings and appropriate ice routine  Baseline:  Goal status: ongoing 12/23/2022   4.  Patient ambulates with cane with knee ext in stance & flexion in swing within 75% of normal range. Baseline:  Goal status: ongoing 12/23/2022   LONG TERM GOALS: Target date: 02/04/2023    MMT right knee >80% of LLE with HH dynameter Baseline:  Goal status: ongoing 12/23/2022  2.  R knee flexion AROM to be at least 105 degrees  Baseline:  Goal status: ongoing 12/23/2022  3.  Will be able to ascend/descend  steps with single rail and reciprocal pattern, no increase in pain  Baseline:  Goal status: ongoing 12/23/2022  4.  Will complete 5x sit to stand test in 15 seconds or less no UEs  Baseline:  Goal status: ongoing 12/23/2022  5.  Will complete TUG test in 13 seconds or less with LRAD to show improved functional balance Baseline:  Goal status: ongoing 12/23/2022  6.  Will ambulate > 581ft with cane & neg ramp / curb modified independent to show improved mobility and community access  Baseline:  Goal status:ongoing 12/23/2022   PLAN:  PT FREQUENCY: 2-5x/wk (daily when clinic open for 2 weeks post manipulation)  PT DURATION: 6 weeks  PLANNED INTERVENTIONS: Therapeutic exercises, Therapeutic activity, Neuromuscular re-education, Balance training, Gait training, Patient/Family education, Self Care, Joint mobilization, Stair training, Orthotic/Fit training, DME instructions, Aquatic Therapy, Dry Needling, Electrical stimulation, Cryotherapy, Moist heat, Taping, Vasopneumatic device, Ultrasound, Ionotophoresis 4mg /ml Dexamethasone, Manual therapy, and Re-evaluation  PLAN FOR NEXT SESSION: send progress note to MD prior to appt on 6/19 at 1:15.  continue with ongoing daily PT when clinic is open following mobilization under anesthesia on 6/6.  Continued focus on tissue extensibility with exercises & manual therapy    Vladimir Faster, PT, DPT 01/05/2023, 1:01 PM

## 2023-01-06 ENCOUNTER — Ambulatory Visit (INDEPENDENT_AMBULATORY_CARE_PROVIDER_SITE_OTHER): Payer: Medicare Other | Admitting: Orthopaedic Surgery

## 2023-01-06 ENCOUNTER — Encounter: Payer: Self-pay | Admitting: Physical Therapy

## 2023-01-06 ENCOUNTER — Encounter: Payer: Medicare Other | Admitting: Physical Therapy

## 2023-01-06 ENCOUNTER — Encounter: Payer: Self-pay | Admitting: Orthopaedic Surgery

## 2023-01-06 ENCOUNTER — Ambulatory Visit (INDEPENDENT_AMBULATORY_CARE_PROVIDER_SITE_OTHER): Payer: Medicare Other | Admitting: Physical Therapy

## 2023-01-06 DIAGNOSIS — R262 Difficulty in walking, not elsewhere classified: Secondary | ICD-10-CM

## 2023-01-06 DIAGNOSIS — M6281 Muscle weakness (generalized): Secondary | ICD-10-CM

## 2023-01-06 DIAGNOSIS — M25661 Stiffness of right knee, not elsewhere classified: Secondary | ICD-10-CM | POA: Diagnosis not present

## 2023-01-06 DIAGNOSIS — R600 Localized edema: Secondary | ICD-10-CM | POA: Diagnosis not present

## 2023-01-06 DIAGNOSIS — T8482XD Fibrosis due to internal orthopedic prosthetic devices, implants and grafts, subsequent encounter: Secondary | ICD-10-CM

## 2023-01-06 DIAGNOSIS — M25561 Pain in right knee: Secondary | ICD-10-CM | POA: Diagnosis not present

## 2023-01-06 DIAGNOSIS — Z96651 Presence of right artificial knee joint: Secondary | ICD-10-CM

## 2023-01-06 NOTE — Progress Notes (Signed)
The patient is following up after having a manipulation under anesthesia of her right total knee arthroplasty secondary to arthrofibrosis.  She is 82 years old.  I was able to flex her knee past 110 degrees and had a photograph showing that.  She is going backwards again in terms of the scar tissue building up in that knee.  On my exam today I could potentially flex her to 90 degrees.  It is still quite limited.  She cannot take anti-inflammatories due to being on chronic blood thinning medications.  She will continue therapy and trying to put herself hard.  She can alternate ice and heat for the knee and we will see her back in 4 weeks and at that visit I would like a standing AP and lateral of her right operative knee.

## 2023-01-06 NOTE — Therapy (Signed)
OUTPATIENT PHYSICAL THERAPY TREATMENT   Patient Name: Melissa Maldonado MRN: [id number] DOB:1941/01/04, 82 y.o., female Today's Date: 01/07/2023   END OF SESSION:  PT End of Session - 01/07/23 1400     Visit Number 24    Number of Visits 40    Date for PT Re-Evaluation 02/04/23    Authorization Type MCR and BCBS [place] - [place]    Authorization Time Period 11/20/22 to 01/15/23    Progress Note Due on Visit 33    PT Start Time 1354   late arrival   PT Stop Time 1426    PT Time Calculation (min) 32 min    Activity Tolerance Patient tolerated treatment well;Patient limited by pain    Behavior During Therapy [facility] for tasks assessed/performed                 Past Medical History:  Diagnosis Date   Arthritis    Chronic back pain    lumbar stenosis   Dysrhythmia    Early cataracts, bilateral    GERD (gastroesophageal reflux disease)    occasionally but no meds required   History of blood transfusion    History of colon polyps    Hyperlipidemia    takes Lipitor every other day   Hypertension    takes losartan daily   Neuropathy    Osteoarthritis of left knee 04/27/2014   Restless leg    Shortness of breath    with exertion   SOB (shortness of breath) 2013   CPET   Tear of medial meniscus of left knee 09/08/2013   Past Surgical History:  Procedure Laterality Date   APPENDECTOMY     BACK SURGERY  07/20/2008   lumb fusion   COLONOSCOPY  2018   EYE SURGERY     both cataracts   KNEE ARTHROSCOPY WITH MEDIAL MENISECTOMY Left 09/08/2013   Procedure: LEFT KNEE ARTHROSCOPY WITH PARTIAL MEDIAL MENISCECTOMY;  Surgeon: Eulas Post, MD;  Location: [facility];  Service: Orthopedics;  Laterality: Left;   LUMBAR LAMINECTOMY/DECOMPRESSION MICRODISCECTOMY  05/13/2012   Procedure: LUMBAR LAMINECTOMY/DECOMPRESSION MICRODISCECTOMY 1 LEVEL;  Surgeon: Barnett Abu, MD;  Location: [facility];  Service: Neurosurgery;  Laterality: Bilateral;  Bilateral Lumbar one -two  Decompressive Laminectomy   PARTIAL KNEE ARTHROPLASTY Left 04/27/2014   Procedure: LEFT UNICOMPARTMENTAL KNEE;  Surgeon: Eulas Post, MD;  Location: [facility];  Service: Orthopedics;  Laterality: Left;   TOTAL KNEE ARTHROPLASTY Right 11/06/2022   Procedure: RIGHT TOTAL KNEE ARTHROPLASTY;  Surgeon: Kathryne Hitch, MD;  Location: [facility];  Service: Orthopedics;  Laterality: Right;   TUBAL LIGATION  1974   UPPER GI ENDOSCOPY     Patient Active Problem List   Diagnosis Date Noted   Status post total right knee replacement 11/06/2022   Arthritis of right acromioclavicular joint 08/28/2021   Impingement syndrome of right shoulder 08/19/2021   Nontraumatic incomplete tear of right rotator cuff 08/19/2021   Paresthesia 12/05/2020   Low back pain with right-sided sciatica 12/05/2020   Trochanteric bursitis, right hip 04/24/2020   Essential hypertension 01/12/2020   Sinus bradycardia 01/12/2020   Dyspnea on exertion 01/12/2020   Lumbar stenosis with neurogenic claudication 02/01/2017   Osteoarthritis of left knee 04/27/2014    PCP: Merri Brunette MD   REFERRING PROVIDER: Kathryne Hitch, MD  REFERRING DIAG: [phone] (ICD-10-CM) - Status post total right knee replacement M17.11 (ICD-10-CM) - Unilateral primary osteoarthritis, right knee  THERAPY DIAG:  Stiffness of right  knee, not elsewhere classified  Muscle weakness (generalized)  Acute pain of right knee  Localized edema  Rationale for Evaluation and Treatment: Rehabilitation  ONSET DATE: 11/09/2022  SUBJECTIVE:   SUBJECTIVE STATEMENT: Pt arrives w/ report of no pain which she attributes to pain meds. Does note that she remains stiff, continues to have fairly significant swelling in limb but states it seems better today.    PERTINENT HISTORY: Hospital Course: RYKIA FINNIN is an 82 y.o. female who was admitted 11/06/2022 for operative treatment ofUnilateral primary osteoarthritis, right knee.  Patient has severe unremitting pain that affects sleep, daily activities, and work/hobbies. After pre-op clearance the patient was taken to the operating room on 11/06/2022 and underwent  Procedure(s): RIGHT TOTAL KNEE ARTHROPLASTY.   PMH: OA, right rotator cuff tear, LBP w/ sciatica, lumbar stenosis, back surgery lumbar fusion & discectomy,  right hip bursitis, HEN, bradycardia,    PAIN:  NPRS scale:   0/10 (stiffness) took pill before PT Pain location: Rt knee  Pain description: sharp  Aggravating factors: doing too much the knee  Relieving factors: unsure, maybe ice   PRECAUTIONS: None  WEIGHT BEARING RESTRICTIONS: No  FALLS:  Has patient fallen in last 6 months? No  LIVING ENVIRONMENT: Lives with: lives with their spouse Lives in: House/apartment Stairs: 4 STE B rails, split level home with 6 steps up/6 steps down U rail  Has following equipment at home: Single point cane, Walker - 2 wheeled, and toilet raiser   OCCUPATION: retired   PLOF: Independent, Independent with basic ADLs, Independent with gait, and Independent with transfers  PATIENT GOALS: be able to walk without pain  NEXT MD VISIT: manipulation 12/24/2022  OBJECTIVE: (objective measures completed at initial evaluation unless otherwise dated)   PATIENT SURVEYS:  12/29/2022: visit 17 45%  12/15/22 visit 10: 41%  11/20/2022 FOTO 55, predicted 61  COGNITION: 11/20/2022 Overall cognitive status:  possible STM impairment, family not present to confirm    SENSATION: 11/20/2022 Not tested  EDEMA:  11/20/2022  Some excessive pitting edema in surgical calf, no temperature or color changes, Homan's negative; on daily Xarelto per her report and per chart, she tells me she has been compliant with this medication   LOWER EXTREMITY ROM:  Active ROM Right eval Right 11/23/22 Right  11/30/22 Right 12/07/22 Right 12/15/22 Right 12/18/22 Right 12/24/22 S/p manipulation Right 12/28/22 Right 12/30/22 Right 12/31/22   Right 01/04/23 Right 01/06/23  Hip flexion              Hip extension              Hip abduction              Hip adduction              Hip internal rotation              Hip external rotation              Knee flexion 64* supine  Seated P: 81* Seated 64-71* PROM and AAROM Seated A: 71* P: 82* Supine  A: 63*, AAROM in sitting 62*, passive 74* Seated AROM in knee flexion stretch 83* Supine P: 94*  Supine  With LE  94* Seated P: 95* AAROM 96* seated PROM  Seated 96* End of session PROM  Seated 96* End of session  Knee extension 16* supine with heel prop  Supine With heel prop P: -14* Seated with foot supported on stool 9* PROM Supine  A:  Quad set -7* Supine with towel roll under heel 22*, seated PROM 12*  Supine P: -9* Standing P with mob belt -10* Standing with mob belt P: -3*  Standing with mob belt P: -3* Standing with mob belt P: -3* Standing A: -11*  Ankle dorsiflexion              Ankle plantarflexion              Ankle inversion              Ankle eversion               (Blank rows = not tested)  LOWER EXTREMITY MMT:  MMT Right eval Left eval Right 12/15/22 Left 12/15/22  Hip flexion 3+ 4+ 4+ 4+  Hip extension      Hip abduction      Hip adduction      Hip internal rotation      Hip external rotation      Knee flexion 4 4 4- 4-  Knee extension 2 4 4 5   Ankle dorsiflexion 5 5    Ankle plantarflexion      Ankle inversion      Ankle eversion       (Blank rows = not tested)    FUNCTIONAL TESTS:  11/20/2022 5 times sit to stand: 43 seconds no UEs; 12/15/22- 15.5 seconds no UEs, severe offshift from R LE/compensations Timed up and go (TUG): 19.5 seconds SPC; 12/15/22- 14.7 seconds SPC   3 minute walk test: 32ft SPC; 12/15/22- 56ft SPC    GAIT: 12/23/2022: pt amb with cane with antalgic pattern with knee flexion in stance and minimal increase flexion for swing.   11/20/2022 / Evaluation: Distance walked: 339ft Assistive device utilized: Single point  cane Level of assistance: Modified independence Comments: slow but steady, able to make sharp turns well at each end of walking course, used SPC on wrong side, foot flat and limited heel-toe pattern, limited R knee flexion/extension in stance/swing phases                     TODAY'S TREATMENT:                                                [facility] Adult PT Treatment:                                                DATE: 01/07/23 Therapeutic Exercise: Long sitting quad set assist from PT for positioning to increase ROM, 2x10  Mini squat at counter for support 3x5 seated rest between, cues for support and appropriate ROM/symmetry Standing hamstring curls unweighted 2x10 UE support at counter Mini lunge weight shifts at counter w/ UE support for knee ROM, 3x5  Manual Therapy: Seated edge of mat; passive physiological movement R knee flex/ext to pt tolerance, oscillations for reduced muscle guarding, gentle distraction to improve knee flex tolerance STM R quad, seated     DATE:   01/06/2023 Therex: UBE UE/LE full revolution lvl 2.0 for 5 min for ROM with seat 11, then 2 min seat 10 for increased flexion,  Gastroc stretch on incline board 30 sec hold 3 reps Heel raises on incline board  15 reps 2 sets Tandem stance on foam beam 30 sec RLE in front & in back Seated LAQ & active knee flexion 3 sec hold for 3 min.  Standing squat over chair 20 reps with knee flexion to 75*   Manual: Seated Rt knee flexion c mobilization c movement IR/distraction.  Contract relax c flexion stretching.  Hypervolt to quad Instrument assisted suction soft tissue mobs to incision. Standing on incline board with BUE support mob belt for ext.   Vasopneumatic 10 mins Rt knee high compression 34 deg in elevation c ankle pumps throughout    TREATMENT:                                               DATE:   01/05/2023 Therex: Gastroc stretch on incline board 30 sec hold 3 reps Heel raises on incline board 15 reps 2  sets Tandem stance on foam beam 30 sec RLE in front & in back Heel slides with lower leg on ball 20 reps. PT manual assist for range 10 reps.  Seated LAQ & active knee flexion 3 sec hold for 3 min.  UBE UE/LE full revolution lvl 2.0 for 5 min for ROM, seat 11   Manual: Seated Rt knee flexion c mobilization c movement IR/distraction.  Contract relax c flexion stretching.   Standing on incline board with BUE support mob belt for ext.  Instrument assisted suction soft tissue mobs to incision.    TREATMENT:                                               DATE: 01/04/2023 Therex: UBE UE/LE full revolution lvl 4.0 for 4 min then level 2 BLEs only for 4 min for ROM, seat 11 Leg press BLEs 81# 10 reps 2 sets 5 sec hold flex & ext; RLE only 37# 10 reps 2 sets  5 sec hold flex & ext Seated LAQ & active knee flexion 3 sec hold for 3 min.    Manual: Hypervolt to quad with noted decreased resistance to knee flexion and soft tissue mobs to medial knee and hamstrings.  Passive hamstring stretch during rest leg press.  Seated Rt knee flexion c mobilization c movement IR/distraction.  Contract relax c flexion stretching.   Standing on incline board with BUE support mob belt for ext.  Instrument assisted soft tissue mobs to hamstring tendons, knee joint and incision.  Her incision was very sensitive.   Vasopneumatic 10 mins Rt knee medium compression 34 deg in elevation c ankle pumps throughout    TREATMENT:      [facility] Adult PT Treatment:                                                DATE: 01/01/23 Therapeutic Exercise: Scifit bike LE/UE full revolutions (4 min seat at 11, 4 min seat at 10 for inc ROM) Long sitting (with support) quad set 2x12 cues for full ROM and breath control LAQ seated 2x8 cues for DF and full ROM, second set PT assist for inc ROM  Seated heel slides w/ strap and  towel 2x8 RLE cues for ROM Manual Therapy: Seated edge of met; passive physiological movement flex/ext knee to  pt tolerance, oscillations to reduce muscle guarding, gentle distraction at knee joint     HOME EXERCISE PROGRAM: Access Code: [email or web address] URL: [email or web address] Date: 11/23/2022 Prepared by: Vladimir Faster  Exercises - Ankle Alphabet in Elevation  - 2-4 x daily - 7 x weekly - 1 sets - 1 reps - Supine Single Leg Ankle Pumps  - 1 x daily - 5 x weekly - 1 sets - 10 reps - 5 seconds hold - Quad Setting and Stretching  - 2-4 x daily - 7 x weekly - 5-10 sets - 10 reps - 5 sec hold - Supine Heel Slide with Strap  - 2-3 x daily - 7 x weekly - 2-3 sets - 10 reps - 5 seconds hold - Supine Knee Extension Strengthening  - 1 x daily - 7 x weekly - 2-3 sets - 10 reps - 5 seconds hold - Supine Straight Leg Raises  - 2-3 x daily - 7 x weekly - 2-3 sets - 10 reps - 5 seconds hold - Seated Knee Flexion Extension AROM   - 2-4 x daily - 7 x weekly - 2-3 sets - 10 reps - 5 seconds hold - Seated straight leg lifts  - 2-3 x daily - 7 x weekly - 2-3 sets - 10 reps - 5 seconds hold - Seated Hamstring Stretch with Strap  - 2-4 x daily - 7 x weekly - 1 sets - 3 reps - 20-30 seconds hold - Seated Long Arc Quad  - 1 x daily - 7 x weekly - 2-3 sets - 10 reps - 5 seconds hold - Heel raises near counter  - 1 x daily - 5 x weekly - 1 sets - 10 reps - 5 seconds hold - standing calf stretch with forefoot on small step or brick  - 1 x daily - 7 x weekly - 1 sets - 3 reps - 20-30 seconds hold - Standing Knee Flexion with Counter Support  - 1 x daily - 7 x weekly - 12-3 sets - 10 reps - 5 seconds hold - Standing March with Counter Support  - 1 x daily - 7 x weekly - 2-3 sets - 10 reps - 5 seconds hold - standing knee extension  - 1 x daily - 7 x weekly - 2-3 sets - 10 reps - 5 seconds hold   ASSESSMENT: CLINICAL IMPRESSION: Pt arrives w/o resting pain which she attributes to medication, primary report of stiffness and continued swelling in surgical limb. She states swelling actually looks a bit improved  today. Followed with surgeon yesterday. Continues with significant limitations in knee mobility with end range pain, subjective improvement with manual and exercise as above, no increase in pain on departure and no adverse events. Recommend continuing along current POC in order to address relevant deficits and improve functional tolerance. Pt departs today's session in no acute distress, all voiced questions/concerns addressed appropriately from PT perspective.     OBJECTIVE IMPAIRMENTS: Abnormal gait, decreased activity tolerance, decreased balance, decreased coordination, decreased mobility, difficulty walking, decreased ROM, decreased strength, hypomobility, increased edema, increased fascial restrictions, impaired flexibility, impaired UE functional use, and pain.   ACTIVITY LIMITATIONS: sitting, standing, squatting, stairs, transfers, bed mobility, and locomotion level  PARTICIPATION LIMITATIONS: cleaning, laundry, driving, shopping, community activity, occupation, and yard work  PERSONAL FACTORS: Age, Behavior pattern, Education, Fitness, Past/current experiences, Social background, and Time since onset of  injury/illness/exacerbation are also affecting patient's functional outcome.   REHAB POTENTIAL: Good  CLINICAL DECISION MAKING: Stable/uncomplicated  EVALUATION COMPLEXITY: Low   GOALS: Goals reviewed with patient? Yes  SHORT TERM GOALS: Target date: 01/15/2023   Will be compliant with appropriate progressive HEP  Baseline: see objective data Goal status: ongoing 12/23/2022  2.  R knee PROM ext -5* and flexion 100* Baseline: see objective data Goal status: ongoing 01/06/2023  3.  Will be independent with management techniques for localized edema including wearing compression stockings and appropriate ice routine  Baseline: see objective data Goal status: ongoing 01/06/2023   4.  Patient ambulates with cane with knee ext in stance & flexion in swing within 75% of normal  range. Baseline: see objective data Goal status: ongoing 01/06/2023   LONG TERM GOALS: Target date: 02/04/2023    MMT right knee >80% of LLE with HH dynameter Baseline: see objective data Goal status: ongoing 12/23/2022  2.  R knee flexion AROM to be at least 105 degrees  Baseline: see objective data Goal status: ongoing 12/23/2022  3.  Will be able to ascend/descend steps with single rail and reciprocal pattern, no increase in pain  Baseline: see objective data Goal status: ongoing 12/23/2022  4.  Will complete 5x sit to stand test in 15 seconds or less no UEs  Baseline: see objective data Goal status: ongoing 12/23/2022  5.  Will complete TUG test in 13 seconds or less with LRAD to show improved functional balance Baseline: see objective data Goal status: ongoing 12/23/2022  6.  Will ambulate > 576ft with cane & neg ramp / curb modified independent to show improved mobility and community access  Baseline: see objective data Goal status:ongoing 12/23/2022   PLAN:  PT FREQUENCY: 2-5x/wk (daily when clinic open for 2 weeks post manipulation)  PT DURATION: 6 weeks  PLANNED INTERVENTIONS: Therapeutic exercises, Therapeutic activity, Neuromuscular re-education, Balance training, Gait training, Patient/Family education, Self Care, Joint mobilization, Stair training, Orthotic/Fit training, DME instructions, Aquatic Therapy, Dry Needling, Electrical stimulation, Cryotherapy, Moist heat, Taping, Vasopneumatic device, Ultrasound, Ionotophoresis 4mg /ml Dexamethasone, Manual therapy, and Re-evaluation  PLAN FOR NEXT SESSION: Continued focus on tissue extensibility with exercises & manual therapy.      Ashley Murrain PT, DPT 01/07/2023 4:18 PM

## 2023-01-06 NOTE — Therapy (Signed)
OUTPATIENT PHYSICAL THERAPY TREATMENT   Patient Name: Melissa Maldonado MRN: [id number] DOB:[birth year], 82 y.o., female Today's Date: 01/06/2023  Progress Note Reporting Period 12/25/2022 to 6/192024  See note below for Objective Data and Assessment of Progress/Goals.      END OF SESSION:  PT End of Session - 01/06/23 1137     Visit Number 23    Number of Visits 40    Authorization Type MCR and BCBS [place] - [place]    Authorization Time Period 11/20/22 to 01/15/23    Progress Note Due on Visit 33    PT Start Time 1138    PT Stop Time 1235    PT Time Calculation (min) 57 min    Activity Tolerance Patient tolerated treatment well;Patient limited by pain    Behavior During Therapy [facility] for tasks assessed/performed                Past Medical History:  Diagnosis Date   Arthritis    Chronic back pain    lumbar stenosis   Dysrhythmia    Early cataracts, bilateral    GERD (gastroesophageal reflux disease)    occasionally but no meds required   History of blood transfusion    History of colon polyps    Hyperlipidemia    takes Lipitor every other day   Hypertension    takes losartan daily   Neuropathy    Osteoarthritis of left knee 04/27/2014   Restless leg    Shortness of breath    with exertion   SOB (shortness of breath) 2013   CPET   Tear of medial meniscus of left knee 09/08/2013   Past Surgical History:  Procedure Laterality Date   APPENDECTOMY     BACK SURGERY  07/20/2008   lumb fusion   COLONOSCOPY  2018   EYE SURGERY     both cataracts   KNEE ARTHROSCOPY WITH MEDIAL MENISECTOMY Left 09/08/2013   Procedure: LEFT KNEE ARTHROSCOPY WITH PARTIAL MEDIAL MENISCECTOMY;  Surgeon: Eulas Post, MD;  Location: [facility];  Service: Orthopedics;  Laterality: Left;   LUMBAR LAMINECTOMY/DECOMPRESSION MICRODISCECTOMY  05/13/2012   Procedure: LUMBAR LAMINECTOMY/DECOMPRESSION MICRODISCECTOMY 1 LEVEL;  Surgeon: Barnett Abu, MD;  Location: [facility];   Service: Neurosurgery;  Laterality: Bilateral;  Bilateral Lumbar one -two Decompressive Laminectomy   PARTIAL KNEE ARTHROPLASTY Left 04/27/2014   Procedure: LEFT UNICOMPARTMENTAL KNEE;  Surgeon: Eulas Post, MD;  Location:  SUR[facility]ERY C[facility];  Service: Orthopedics;  Laterality: Left;   TOTAL KNEE ARTHROPLASTY Right 11/06/2022   Procedure: RIGHT TOTAL KNEE ARTHROPLASTY;  Surgeon: Kathryne Hitch, MD;  Location: [facility];  Service: Orthopedics;  Laterality: Right;   TUBAL LIGATION  1974   UPPER GI ENDOSCOPY     Patient Active Problem List   Diagnosis Date Noted   Status post total right knee replacement 11/06/2022   Arthritis of right acromioclavicular joint 08/28/2021   Impingement syndrome of right shoulder 08/19/2021   Nontraumatic incomplete tear of right rotator cuff 08/19/2021   Paresthesia 12/05/2020   Low back pain with right-sided sciatica 12/05/2020   Trochanteric bursitis, right hip 04/24/2020   Essential hypertension 01/12/2020   Sinus bradycardia 01/12/2020   Dyspnea on exertion 01/12/2020   Lumbar stenosis with neurogenic claudication 02/01/2017   Osteoarthritis of left knee 04/27/2014    PCP: Merri Brunette MD   REFERRING PROVIDER: Kathryne Hitch, MD  REFERRING DIAG: [phone] (ICD-10-CM) - Status post total right knee replacement M17.11 (ICD-10-CM) - Unilateral primary  osteoarthritis, right knee  THERAPY DIAG:  Stiffness of right knee, not elsewhere classified  Muscle weakness (generalized)  Acute pain of right knee  Localized edema  Difficulty in walking, not elsewhere classified  Rationale for Evaluation and Treatment: Rehabilitation  ONSET DATE: 11/09/2022  SUBJECTIVE:   SUBJECTIVE STATEMENT: She sees Dr. Magnus Ivan after PT today. The suction soft tissue work in PT yesterday was more tolerable than the scraping with metal tool bar the previous session.   PERTINENT HISTORY: Hospital Course: Melissa Maldonado is an 82 y.o. female who  was admitted 11/06/2022 for operative treatment ofUnilateral primary osteoarthritis, right knee. Patient has severe unremitting pain that affects sleep, daily activities, and work/hobbies. After pre-op clearance the patient was taken to the operating room on 11/06/2022 and underwent  Procedure(s): RIGHT TOTAL KNEE ARTHROPLASTY.   PMH: OA, right rotator cuff tear, LBP w/ sciatica, lumbar stenosis, back surgery lumbar fusion & discectomy,  right hip bursitis, HEN, bradycardia,    PAIN:  NPRS scale:   0/10 (stiffness) took pill before PT Pain location: Rt knee  Pain description: sharp  Aggravating factors: doing too much the knee  Relieving factors: unsure, maybe ice   PRECAUTIONS: None  WEIGHT BEARING RESTRICTIONS: No  FALLS:  Has patient fallen in last 6 months? No  LIVING ENVIRONMENT: Lives with: lives with their spouse Lives in: House/apartment Stairs: 4 STE B rails, split level home with 6 steps up/6 steps down U rail  Has following equipment at home: Single point cane, Walker - 2 wheeled, and toilet raiser   OCCUPATION: retired   PLOF: Independent, Independent with basic ADLs, Independent with gait, and Independent with transfers  PATIENT GOALS: be able to walk without pain  NEXT MD VISIT: manipulation 12/24/2022  OBJECTIVE: (objective measures completed at initial evaluation unless otherwise dated)   PATIENT SURVEYS:  12/29/2022: visit 17 45%  12/15/22 visit 10: 41%  11/20/2022 FOTO 55, predicted 61  COGNITION: 11/20/2022 Overall cognitive status:  possible STM impairment, family not present to confirm    SENSATION: 11/20/2022 Not tested  EDEMA:  11/20/2022  Some excessive pitting edema in surgical calf, no temperature or color changes, Homan's negative; on daily Xarelto per her report and per chart, she tells me she has been compliant with this medication   LOWER EXTREMITY ROM:  Active ROM Right eval Right 11/23/22 Right  11/30/22 Right 12/07/22 Right 12/15/22  Right 12/18/22 Right 12/24/22 S/p manipulation Right 12/28/22 Right 12/30/22 Right 12/31/22  Right 01/04/23 Right 01/06/23  Hip flexion              Hip extension              Hip abduction              Hip adduction              Hip internal rotation              Hip external rotation              Knee flexion 64* supine  Seated P: 81* Seated 64-71* PROM and AAROM Seated A: 71* P: 82* Supine  A: 63*, AAROM in sitting 62*, passive 74* Seated AROM in knee flexion stretch 83* Supine P: 94*  Supine  With LE  94* Seated P: 95* AAROM 96* seated PROM  Seated 96* End of session PROM  Seated 96* End of session  Knee extension 16* supine with heel prop  Supine With heel prop P: -14*  Seated with foot supported on stool 9* PROM Supine  A: Quad set -7* Supine with towel roll under heel 22*, seated PROM 12*  Supine P: -9* Standing P with mob belt -10* Standing with mob belt P: -3*  Standing with mob belt P: -3* Standing with mob belt P: -3* Standing A: -11*  Ankle dorsiflexion              Ankle plantarflexion              Ankle inversion              Ankle eversion               (Blank rows = not tested)  LOWER EXTREMITY MMT:  MMT Right eval Left eval Right 12/15/22 Left 12/15/22  Hip flexion 3+ 4+ 4+ 4+  Hip extension      Hip abduction      Hip adduction      Hip internal rotation      Hip external rotation      Knee flexion 4 4 4- 4-  Knee extension 2 4 4 5   Ankle dorsiflexion 5 5    Ankle plantarflexion      Ankle inversion      Ankle eversion       (Blank rows = not tested)    FUNCTIONAL TESTS:  11/20/2022 5 times sit to stand: 43 seconds no UEs; 12/15/22- 15.5 seconds no UEs, severe offshift from R LE/compensations Timed up and go (TUG): 19.5 seconds SPC; 12/15/22- 14.7 seconds SPC   3 minute walk test: 316ft SPC; 12/15/22- 52ft SPC    GAIT: 12/23/2022: pt amb with cane with antalgic pattern with knee flexion in stance and minimal increase flexion for swing.    11/20/2022 / Evaluation: Distance walked: 352ft Assistive device utilized: Single point cane Level of assistance: Modified independence Comments: slow but steady, able to make sharp turns well at each end of walking course, used SPC on wrong side, foot flat and limited heel-toe pattern, limited R knee flexion/extension in stance/swing phases                     TODAY'S TREATMENT:                                               DATE:   01/06/2023 Therex: UBE UE/LE full revolution lvl 2.0 for 5 min for ROM with seat 11, then 2 min seat 10 for increased flexion,  Gastroc stretch on incline board 30 sec hold 3 reps Heel raises on incline board 15 reps 2 sets Tandem stance on foam beam 30 sec RLE in front & in back Seated LAQ & active knee flexion 3 sec hold for 3 min.  Standing squat over chair 20 reps with knee flexion to 75*   Manual: Seated Rt knee flexion c mobilization c movement IR/distraction.  Contract relax c flexion stretching.  Hypervolt to quad Instrument assisted suction soft tissue mobs to incision. Standing on incline board with BUE support mob belt for ext.   Vasopneumatic 10 mins Rt knee high compression 34 deg in elevation c ankle pumps throughout    TREATMENT:  DATE:   01/05/2023 Therex: Gastroc stretch on incline board 30 sec hold 3 reps Heel raises on incline board 15 reps 2 sets Tandem stance on foam beam 30 sec RLE in front & in back Heel slides with lower leg on ball 20 reps. PT manual assist for range 10 reps.  Seated LAQ & active knee flexion 3 sec hold for 3 min.  UBE UE/LE full revolution lvl 2.0 for 5 min for ROM, seat 11   Manual: Seated Rt knee flexion c mobilization c movement IR/distraction.  Contract relax c flexion stretching.   Standing on incline board with BUE support mob belt for ext.  Instrument assisted suction soft tissue mobs to incision.    TREATMENT:                                                DATE: 01/04/2023 Therex: UBE UE/LE full revolution lvl 4.0 for 4 min then level 2 BLEs only for 4 min for ROM, seat 11 Leg press BLEs 81# 10 reps 2 sets 5 sec hold flex & ext; RLE only 37# 10 reps 2 sets  5 sec hold flex & ext Seated LAQ & active knee flexion 3 sec hold for 3 min.    Manual: Hypervolt to quad with noted decreased resistance to knee flexion and soft tissue mobs to medial knee and hamstrings.  Passive hamstring stretch during rest leg press.  Seated Rt knee flexion c mobilization c movement IR/distraction.  Contract relax c flexion stretching.   Standing on incline board with BUE support mob belt for ext.  Instrument assisted soft tissue mobs to hamstring tendons, knee joint and incision.  Her incision was very sensitive.   Vasopneumatic 10 mins Rt knee medium compression 34 deg in elevation c ankle pumps throughout    TREATMENT:      [facility] Adult PT Treatment:                                                DATE: 01/01/23 Therapeutic Exercise: Scifit bike LE/UE full revolutions (4 min seat at 11, 4 min seat at 10 for inc ROM) Long sitting (with support) quad set 2x12 cues for full ROM and breath control LAQ seated 2x8 cues for DF and full ROM, second set PT assist for inc ROM  Seated heel slides w/ strap and towel 2x8 RLE cues for ROM Manual Therapy: Seated edge of met; passive physiological movement flex/ext knee to pt tolerance, oscillations to reduce muscle guarding, gentle distraction at knee joint     HOME EXERCISE PROGRAM: Access Code: [email or web address] URL: [email or web address] Date: 11/23/2022 Prepared by: Vladimir Faster  Exercises - Ankle Alphabet in Elevation  - 2-4 x daily - 7 x weekly - 1 sets - 1 reps - Supine Single Leg Ankle Pumps  - 1 x daily - 5 x weekly - 1 sets - 10 reps - 5 seconds hold - Quad Setting and Stretching  - 2-4 x daily - 7 x weekly - 5-10 sets - 10 reps - 5 sec hold - Supine Heel Slide with Strap  - 2-3 x daily - 7 x  weekly - 2-3 sets - 10 reps - 5 seconds hold - Supine Knee Extension  Strengthening  - 1 x daily - 7 x weekly - 2-3 sets - 10 reps - 5 seconds hold - Supine Straight Leg Raises  - 2-3 x daily - 7 x weekly - 2-3 sets - 10 reps - 5 seconds hold - Seated Knee Flexion Extension AROM   - 2-4 x daily - 7 x weekly - 2-3 sets - 10 reps - 5 seconds hold - Seated straight leg lifts  - 2-3 x daily - 7 x weekly - 2-3 sets - 10 reps - 5 seconds hold - Seated Hamstring Stretch with Strap  - 2-4 x daily - 7 x weekly - 1 sets - 3 reps - 20-30 seconds hold - Seated Long Arc Quad  - 1 x daily - 7 x weekly - 2-3 sets - 10 reps - 5 seconds hold - Heel raises near counter  - 1 x daily - 5 x weekly - 1 sets - 10 reps - 5 seconds hold - standing calf stretch with forefoot on small step or brick  - 1 x daily - 7 x weekly - 1 sets - 3 reps - 20-30 seconds hold - Standing Knee Flexion with Counter Support  - 1 x daily - 7 x weekly - 12-3 sets - 10 reps - 5 seconds hold - Standing March with Counter Support  - 1 x daily - 7 x weekly - 2-3 sets - 10 reps - 5 seconds hold - standing knee extension  - 1 x daily - 7 x weekly - 2-3 sets - 10 reps - 5 seconds hold   ASSESSMENT: CLINICAL IMPRESSION: Patient continues to have limited range in extension & flexion.  Patient continues to be limited by pain. She has fibrous feeling to scar which limits her flexion.  Patient continues to benefit from skilled PT.   OBJECTIVE IMPAIRMENTS: Abnormal gait, decreased activity tolerance, decreased balance, decreased coordination, decreased mobility, difficulty walking, decreased ROM, decreased strength, hypomobility, increased edema, increased fascial restrictions, impaired flexibility, impaired UE functional use, and pain.   ACTIVITY LIMITATIONS: sitting, standing, squatting, stairs, transfers, bed mobility, and locomotion level  PARTICIPATION LIMITATIONS: cleaning, laundry, driving, shopping, community activity, occupation, and yard  work  PERSONAL FACTORS: Age, Behavior pattern, Education, Fitness, Past/current experiences, Social background, and Time since onset of injury/illness/exacerbation are also affecting patient's functional outcome.   REHAB POTENTIAL: Good  CLINICAL DECISION MAKING: Stable/uncomplicated  EVALUATION COMPLEXITY: Low   GOALS: Goals reviewed with patient? Yes  SHORT TERM GOALS: Target date: 01/15/2023   Will be compliant with appropriate progressive HEP  Baseline: see objective data Goal status: ongoing 12/23/2022  2.  R knee PROM ext -5* and flexion 100* Baseline: see objective data Goal status: ongoing 01/06/2023  3.  Will be independent with management techniques for localized edema including wearing compression stockings and appropriate ice routine  Baseline: see objective data Goal status: ongoing 01/06/2023   4.  Patient ambulates with cane with knee ext in stance & flexion in swing within 75% of normal range. Baseline: see objective data Goal status: ongoing 01/06/2023   LONG TERM GOALS: Target date: 02/04/2023    MMT right knee >80% of LLE with HH dynameter Baseline: see objective data Goal status: ongoing 12/23/2022  2.  R knee flexion AROM to be at least 105 degrees  Baseline: see objective data Goal status: ongoing 12/23/2022  3.  Will be able to ascend/descend steps with single rail and reciprocal pattern, no increase in pain  Baseline: see objective data Goal status:  ongoing 12/23/2022  4.  Will complete 5x sit to stand test in 15 seconds or less no UEs  Baseline: see objective data Goal status: ongoing 12/23/2022  5.  Will complete TUG test in 13 seconds or less with LRAD to show improved functional balance Baseline: see objective data Goal status: ongoing 12/23/2022  6.  Will ambulate > 572ft with cane & neg ramp / curb modified independent to show improved mobility and community access  Baseline: see objective data Goal status:ongoing 12/23/2022   PLAN:  PT  FREQUENCY: 2-5x/wk (daily when clinic open for 2 weeks post manipulation)  PT DURATION: 6 weeks  PLANNED INTERVENTIONS: Therapeutic exercises, Therapeutic activity, Neuromuscular re-education, Balance training, Gait training, Patient/Family education, Self Care, Joint mobilization, Stair training, Orthotic/Fit training, DME instructions, Aquatic Therapy, Dry Needling, Electrical stimulation, Cryotherapy, Moist heat, Taping, Vasopneumatic device, Ultrasound, Ionotophoresis 4mg /ml Dexamethasone, Manual therapy, and Re-evaluation  PLAN FOR NEXT SESSION: Continued focus on tissue extensibility with exercises & manual therapy.     Vladimir Faster, PT, DPT 01/06/2023, 12:31 PM

## 2023-01-07 ENCOUNTER — Ambulatory Visit (INDEPENDENT_AMBULATORY_CARE_PROVIDER_SITE_OTHER): Payer: Medicare Other | Admitting: Physical Therapy

## 2023-01-07 ENCOUNTER — Encounter: Payer: Self-pay | Admitting: Physical Therapy

## 2023-01-07 DIAGNOSIS — M6281 Muscle weakness (generalized): Secondary | ICD-10-CM | POA: Diagnosis not present

## 2023-01-07 DIAGNOSIS — M25561 Pain in right knee: Secondary | ICD-10-CM

## 2023-01-07 DIAGNOSIS — R600 Localized edema: Secondary | ICD-10-CM | POA: Diagnosis not present

## 2023-01-07 DIAGNOSIS — M25661 Stiffness of right knee, not elsewhere classified: Secondary | ICD-10-CM

## 2023-01-08 ENCOUNTER — Encounter: Payer: Self-pay | Admitting: Rehabilitative and Restorative Service Providers"

## 2023-01-08 ENCOUNTER — Ambulatory Visit (INDEPENDENT_AMBULATORY_CARE_PROVIDER_SITE_OTHER): Payer: Medicare Other | Admitting: Rehabilitative and Restorative Service Providers"

## 2023-01-08 DIAGNOSIS — M6281 Muscle weakness (generalized): Secondary | ICD-10-CM | POA: Diagnosis not present

## 2023-01-08 DIAGNOSIS — R600 Localized edema: Secondary | ICD-10-CM

## 2023-01-08 DIAGNOSIS — R262 Difficulty in walking, not elsewhere classified: Secondary | ICD-10-CM

## 2023-01-08 DIAGNOSIS — M25561 Pain in right knee: Secondary | ICD-10-CM | POA: Diagnosis not present

## 2023-01-08 DIAGNOSIS — M25661 Stiffness of right knee, not elsewhere classified: Secondary | ICD-10-CM

## 2023-01-08 NOTE — Therapy (Signed)
OUTPATIENT PHYSICAL THERAPY TREATMENT   Patient Name: Melissa Maldonado MRN: [id number] DOB:May 03, 1941, 82 y.o., female Today's Date: 01/08/2023   END OF SESSION:  PT End of Session - 01/08/23 1150     Visit Number 25    Number of Visits 40    Date for PT Re-Evaluation 02/04/23    Authorization Type MCR and BCBS State - [place]    Authorization Time Period 11/20/22 to 01/15/23    Progress Note Due on Visit 33    PT Start Time 1145    PT Stop Time 1235    PT Time Calculation (min) 50 min    Activity Tolerance Patient tolerated treatment well    Behavior During Therapy [facility] for tasks assessed/performed                  Past Medical History:  Diagnosis Date   Arthritis    Chronic back pain    lumbar stenosis   Dysrhythmia    Early cataracts, bilateral    GERD (gastroesophageal reflux disease)    occasionally but no meds required   History of blood transfusion    History of colon polyps    Hyperlipidemia    takes Lipitor every other day   Hypertension    takes losartan daily   Neuropathy    Osteoarthritis of left knee 04/27/2014   Restless leg    Shortness of breath    with exertion   SOB (shortness of breath) 2013   CPET   Tear of medial meniscus of left knee 09/08/2013   Past Surgical History:  Procedure Laterality Date   APPENDECTOMY     BACK SURGERY  07/20/2008   lumb fusion   COLONOSCOPY  2018   EYE SURGERY     both cataracts   KNEE ARTHROSCOPY WITH MEDIAL MENISECTOMY Left 09/08/2013   Procedure: LEFT KNEE ARTHROSCOPY WITH PARTIAL MEDIAL MENISCECTOMY;  Surgeon: Eulas Post, MD;  Location: [facility];  Service: Orthopedics;  Laterality: Left;   LUMBAR LAMINECTOMY/DECOMPRESSION MICRODISCECTOMY  05/13/2012   Procedure: LUMBAR LAMINECTOMY/DECOMPRESSION MICRODISCECTOMY 1 LEVEL;  Surgeon: Barnett Abu, MD;  Location: [facility];  Service: Neurosurgery;  Laterality: Bilateral;  Bilateral Lumbar one -two Decompressive Laminectomy   PARTIAL  KNEE ARTHROPLASTY Left 04/27/2014   Procedure: LEFT UNICOMPARTMENTAL KNEE;  Surgeon: Eulas Post, MD;  Location: [facility];  Service: Orthopedics;  Laterality: Left;   TOTAL KNEE ARTHROPLASTY Right 11/06/2022   Procedure: RIGHT TOTAL KNEE ARTHROPLASTY;  Surgeon: Kathryne Hitch, MD;  Location: [facility];  Service: Orthopedics;  Laterality: Right;   TUBAL LIGATION  1974   UPPER GI ENDOSCOPY     Patient Active Problem List   Diagnosis Date Noted   Status post total right knee replacement 11/06/2022   Arthritis of right acromioclavicular joint 08/28/2021   Impingement syndrome of right shoulder 08/19/2021   Nontraumatic incomplete tear of right rotator cuff 08/19/2021   Paresthesia 12/05/2020   Low back pain with right-sided sciatica 12/05/2020   Trochanteric bursitis, right hip 04/24/2020   Essential hypertension 01/12/2020   Sinus bradycardia 01/12/2020   Dyspnea on exertion 01/12/2020   Lumbar stenosis with neurogenic claudication 02/01/2017   Osteoarthritis of left knee 04/27/2014    PCP: Merri Brunette MD   REFERRING PROVIDER: Kathryne Hitch, MD  REFERRING DIAG: [phone] (ICD-10-CM) - Status post total right knee replacement M17.11 (ICD-10-CM) - Unilateral primary osteoarthritis, right knee  THERAPY DIAG:  Stiffness of right knee, not elsewhere classified  Acute pain of right knee  Localized edema  Muscle weakness (generalized)  Difficulty in walking, not elsewhere classified  Rationale for Evaluation and Treatment: Rehabilitation  ONSET DATE: 11/09/2022  SUBJECTIVE:   SUBJECTIVE STATEMENT: She indicated feeling tighter today, Reported pain at 4/10 without medicine today.    PERTINENT HISTORY: Hospital Course: Melissa Maldonado is an 82 y.o. female who was admitted 11/06/2022 for operative treatment ofUnilateral primary osteoarthritis, right knee. Patient has severe unremitting pain that affects sleep, daily activities, and work/hobbies.  After pre-op clearance the patient was taken to the operating room on 11/06/2022 and underwent  Procedure(s): RIGHT TOTAL KNEE ARTHROPLASTY.   PMH: OA, right rotator cuff tear, LBP w/ sciatica, lumbar stenosis, back surgery lumbar fusion & discectomy,  right hip bursitis, HEN, bradycardia,    PAIN:  NPRS scale:   4/10.  Pain location: Rt knee  Pain description: sharp  Aggravating factors: doing too much the knee  Relieving factors: unsure, maybe ice   PRECAUTIONS: None  WEIGHT BEARING RESTRICTIONS: No  FALLS:  Has patient fallen in last 6 months? No  LIVING ENVIRONMENT: Lives with: lives with their spouse Lives in: House/apartment Stairs: 4 STE B rails, split level home with 6 steps up/6 steps down U rail  Has following equipment at home: Single point cane, Walker - 2 wheeled, and toilet raiser   OCCUPATION: retired   PLOF: Independent, Independent with basic ADLs, Independent with gait, and Independent with transfers  PATIENT GOALS: be able to walk without pain  NEXT MD VISIT: manipulation 12/24/2022  OBJECTIVE: (objective measures completed at initial evaluation unless otherwise dated)   PATIENT SURVEYS:  12/29/2022: visit 17 45%  12/15/22 visit 10: 41%  11/20/2022 FOTO 55, predicted 61  COGNITION: 11/20/2022 Overall cognitive status:  possible STM impairment, family not present to confirm    SENSATION: 11/20/2022 Not tested  EDEMA:  11/20/2022  Some excessive pitting edema in surgical calf, no temperature or color changes, Homan's negative; on daily Xarelto per her report and per chart, she tells me she has been compliant with this medication   LOWER EXTREMITY ROM:  Active ROM Right eval Right 11/23/22 Right  11/30/22 Right 12/07/22 Right 12/15/22 Right 12/18/22 Right 12/24/22 S/p manipulation Right 12/28/22 Right 12/30/22 Right 12/31/22  Right 01/04/23 Right 01/06/23  Hip flexion              Hip extension              Hip abduction              Hip adduction               Hip internal rotation              Hip external rotation              Knee flexion 64* supine  Seated P: 81* Seated 64-71* PROM and AAROM Seated A: 71* P: 82* Supine  A: 63*, AAROM in sitting 62*, passive 74* Seated AROM in knee flexion stretch 83* Supine P: 94*  Supine  With LE  94* Seated P: 95* AAROM 96* seated PROM  Seated 96* End of session PROM  Seated 96* End of session  Knee extension 16* supine with heel prop  Supine With heel prop P: -14* Seated with foot supported on stool 9* PROM Supine  A: Quad set -7* Supine with towel roll under heel 22*, seated PROM 12*  Supine P: -9* Standing P with mob belt -10*  Standing with mob belt P: -3*  Standing with mob belt P: -3* Standing with mob belt P: -3* Standing A: -11*  Ankle dorsiflexion              Ankle plantarflexion              Ankle inversion              Ankle eversion               (Blank rows = not tested)  LOWER EXTREMITY MMT:  MMT Right eval Left eval Right 12/15/22 Left 12/15/22  Hip flexion 3+ 4+ 4+ 4+  Hip extension      Hip abduction      Hip adduction      Hip internal rotation      Hip external rotation      Knee flexion 4 4 4- 4-  Knee extension 2 4 4 5   Ankle dorsiflexion 5 5    Ankle plantarflexion      Ankle inversion      Ankle eversion       (Blank rows = not tested)    FUNCTIONAL TESTS:  11/20/2022 5 times sit to stand: 43 seconds no UEs; 12/15/22- 15.5 seconds no UEs, severe offshift from R LE/compensations Timed up and go (TUG): 19.5 seconds SPC; 12/15/22- 14.7 seconds SPC   3 minute walk test: 366ft SPC; 12/15/22- 558ft SPC    GAIT: 12/23/2022: pt amb with cane with antalgic pattern with knee flexion in stance and minimal increase flexion for swing.   11/20/2022 / Evaluation: Distance walked: 317ft Assistive device utilized: Single point cane Level of assistance: Modified independence Comments: slow but steady, able to make sharp turns well at each end of walking course,  used SPC on wrong side, foot flat and limited heel-toe pattern, limited R knee flexion/extension in stance/swing phases                     TODAY'S TREATMENT:                                                                                              DATE: 01/08/2023 Therex: UBE UE/LE full revolution lvl 2.0 8 mins for ROM, seat 11 Leg press Double leg 2 x 15 75 lbs, single leg 31 lbs 2 x 10 with 30 sec rest breaks into flexion after each set.  Gastroc stretch on incline board 30 sec hold 3 reps Supine heel prop to tolerance with start of vaso -  Manual: Seated Rt knee flexion c mobilization c movement IR/distraction.  Contract relax c flexion stretching  Extended time in manual for mobility gains.    Vasopneumatic 10 mins Rt knee high compression 34 deg in elevation c ankle pumps throughout   TODAY'S TREATMENT:  DATE: 01/07/23 Therapeutic Exercise: Long sitting quad set assist from PT for positioning to increase ROM, 2x10  Mini squat at counter for support 3x5 seated rest between, cues for support and appropriate ROM/symmetry Standing hamstring curls unweighted 2x10 UE support at counter Mini lunge weight shifts at counter w/ UE support for knee ROM, 3x5  Manual Therapy: Seated edge of mat; passive physiological movement R knee flex/ext to pt tolerance, oscillations for reduced muscle guarding, gentle distraction to improve knee flex tolerance STM R quad, seated   TODAY'S TREATMENT:                                                                                              DATE: 01/06/2023 Therex: UBE UE/LE full revolution lvl 2.0 for 5 min for ROM with seat 11, then 2 min seat 10 for increased flexion,  Gastroc stretch on incline board 30 sec hold 3 reps Heel raises on incline board 15 reps 2 sets Tandem stance on foam beam 30 sec RLE in front & in back Seated LAQ & active knee flexion 3  sec hold for 3 min.  Standing squat over chair 20 reps with knee flexion to 75*   Manual: Seated Rt knee flexion c mobilization c movement IR/distraction.  Contract relax c flexion stretching.  Hypervolt to quad Instrument assisted suction soft tissue mobs to incision. Standing on incline board with BUE support mob belt for ext.   Vasopneumatic 10 mins Rt knee high compression 34 deg in elevation c ankle pumps throughout    TODAY'S TREATMENT:                                                                                              DATE: 01/05/2023 Therex: Gastroc stretch on incline board 30 sec hold 3 reps Heel raises on incline board 15 reps 2 sets Tandem stance on foam beam 30 sec RLE in front & in back Heel slides with lower leg on ball 20 reps. PT manual assist for range 10 reps.  Seated LAQ & active knee flexion 3 sec hold for 3 min.  UBE UE/LE full revolution lvl 2.0 for 5 min for ROM, seat 11   Manual: Seated Rt knee flexion c mobilization c movement IR/distraction.  Contract relax c flexion stretching.   Standing on incline board with BUE support mob belt for ext.  Instrument assisted suction soft tissue mobs to incision.    HOME EXERCISE PROGRAM: Access Code: [email or web address] URL: [email or web address] Date: 11/23/2022 Prepared by: Vladimir Faster  Exercises - Ankle Alphabet in Elevation  - 2-4 x daily - 7 x weekly - 1 sets - 1 reps - Supine Single Leg Ankle Pumps  - 1 x daily - 5 x weekly - 1 sets -  10 reps - 5 seconds hold - Quad Setting and Stretching  - 2-4 x daily - 7 x weekly - 5-10 sets - 10 reps - 5 sec hold - Supine Heel Slide with Strap  - 2-3 x daily - 7 x weekly - 2-3 sets - 10 reps - 5 seconds hold - Supine Knee Extension Strengthening  - 1 x daily - 7 x weekly - 2-3 sets - 10 reps - 5 seconds hold - Supine Straight Leg Raises  - 2-3 x daily - 7 x weekly - 2-3 sets - 10 reps - 5 seconds hold - Seated Knee Flexion Extension AROM   - 2-4 x daily - 7  x weekly - 2-3 sets - 10 reps - 5 seconds hold - Seated straight leg lifts  - 2-3 x daily - 7 x weekly - 2-3 sets - 10 reps - 5 seconds hold - Seated Hamstring Stretch with Strap  - 2-4 x daily - 7 x weekly - 1 sets - 3 reps - 20-30 seconds hold - Seated Long Arc Quad  - 1 x daily - 7 x weekly - 2-3 sets - 10 reps - 5 seconds hold - Heel raises near counter  - 1 x daily - 5 x weekly - 1 sets - 10 reps - 5 seconds hold - standing calf stretch with forefoot on small step or brick  - 1 x daily - 7 x weekly - 1 sets - 3 reps - 20-30 seconds hold - Standing Knee Flexion with Counter Support  - 1 x daily - 7 x weekly - 12-3 sets - 10 reps - 5 seconds hold - Standing March with Counter Support  - 1 x daily - 7 x weekly - 2-3 sets - 10 reps - 5 seconds hold - standing knee extension  - 1 x daily - 7 x weekly - 2-3 sets - 10 reps - 5 seconds hold   ASSESSMENT: CLINICAL IMPRESSION: Mobility deficits still primary concern and big contributor to altered gait and movement patterns/symptoms.  Continued emphasis on manual intervention paired c consistent HEP to help address restrictions.   OBJECTIVE IMPAIRMENTS: Abnormal gait, decreased activity tolerance, decreased balance, decreased coordination, decreased mobility, difficulty walking, decreased ROM, decreased strength, hypomobility, increased edema, increased fascial restrictions, impaired flexibility, impaired UE functional use, and pain.   ACTIVITY LIMITATIONS: sitting, standing, squatting, stairs, transfers, bed mobility, and locomotion level  PARTICIPATION LIMITATIONS: cleaning, laundry, driving, shopping, community activity, occupation, and yard work  PERSONAL FACTORS: Age, Behavior pattern, Education, Fitness, Past/current experiences, Social background, and Time since onset of injury/illness/exacerbation are also affecting patient's functional outcome.   REHAB POTENTIAL: Good  CLINICAL DECISION MAKING: Stable/uncomplicated  EVALUATION  COMPLEXITY: Low   GOALS: Goals reviewed with patient? Yes  SHORT TERM GOALS: Target date: 01/15/2023   Will be compliant with appropriate progressive HEP  Baseline: see objective data Goal status: ongoing 12/23/2022  2.  R knee PROM ext -5* and flexion 100* Baseline: see objective data Goal status: ongoing 01/06/2023  3.  Will be independent with management techniques for localized edema including wearing compression stockings and appropriate ice routine  Baseline: see objective data Goal status: ongoing 01/06/2023   4.  Patient ambulates with cane with knee ext in stance & flexion in swing within 75% of normal range. Baseline: see objective data Goal status: ongoing 01/06/2023   LONG TERM GOALS: Target date: 02/04/2023    MMT right knee >80% of LLE with HH dynameter Baseline: see objective data  Goal status: ongoing 12/23/2022  2.  R knee flexion AROM to be at least 105 degrees  Baseline: see objective data Goal status: ongoing 12/23/2022  3.  Will be able to ascend/descend steps with single rail and reciprocal pattern, no increase in pain  Baseline: see objective data Goal status: ongoing 12/23/2022  4.  Will complete 5x sit to stand test in 15 seconds or less no UEs  Baseline: see objective data Goal status: ongoing 12/23/2022  5.  Will complete TUG test in 13 seconds or less with LRAD to show improved functional balance Baseline: see objective data Goal status: ongoing 12/23/2022  6.  Will ambulate > 558ft with cane & neg ramp / curb modified independent to show improved mobility and community access  Baseline: see objective data Goal status:ongoing 12/23/2022   PLAN:  PT FREQUENCY: 2-5x/wk (daily when clinic open for 2 weeks post manipulation)  PT DURATION: 6 weeks  PLANNED INTERVENTIONS: Therapeutic exercises, Therapeutic activity, Neuromuscular re-education, Balance training, Gait training, Patient/Family education, Self Care, Joint mobilization, Stair training,  Orthotic/Fit training, DME instructions, Aquatic Therapy, Dry Needling, Electrical stimulation, Cryotherapy, Moist heat, Taping, Vasopneumatic device, Ultrasound, Ionotophoresis 4mg /ml Dexamethasone, Manual therapy, and Re-evaluation  PLAN FOR NEXT SESSION: Gain motion, improve ambulation (FWW to [facility]).  STG reassessment.   Chyrel Masson, PT, DPT, OCS, ATC 01/08/23  12:33 PM

## 2023-01-11 ENCOUNTER — Encounter: Payer: Self-pay | Admitting: Physical Therapy

## 2023-01-11 ENCOUNTER — Ambulatory Visit (INDEPENDENT_AMBULATORY_CARE_PROVIDER_SITE_OTHER): Payer: Medicare Other | Admitting: Physical Therapy

## 2023-01-11 DIAGNOSIS — M25661 Stiffness of right knee, not elsewhere classified: Secondary | ICD-10-CM

## 2023-01-11 DIAGNOSIS — M6281 Muscle weakness (generalized): Secondary | ICD-10-CM

## 2023-01-11 DIAGNOSIS — R600 Localized edema: Secondary | ICD-10-CM | POA: Diagnosis not present

## 2023-01-11 DIAGNOSIS — M25561 Pain in right knee: Secondary | ICD-10-CM | POA: Diagnosis not present

## 2023-01-11 DIAGNOSIS — R262 Difficulty in walking, not elsewhere classified: Secondary | ICD-10-CM | POA: Diagnosis not present

## 2023-01-11 NOTE — Therapy (Signed)
OUTPATIENT PHYSICAL THERAPY TREATMENT   Patient Name: Melissa Maldonado MRN: [id number] DOB:05-31-1941, 82 y.o., female Today's Date: 01/11/2023   END OF SESSION:  PT End of Session - 01/11/23 1149     Visit Number 26    Number of Visits 40    Date for PT Re-Evaluation 02/04/23    Authorization Type MCR and BCBS State - [place]    Authorization Time Period 11/20/22 to 01/15/23    Progress Note Due on Visit 33    PT Start Time 1145    PT Stop Time 1230    PT Time Calculation (min) 45 min    Activity Tolerance Patient tolerated treatment well    Behavior During Therapy [facility] for tasks assessed/performed                  Past Medical History:  Diagnosis Date   Arthritis    Chronic back pain    lumbar stenosis   Dysrhythmia    Early cataracts, bilateral    GERD (gastroesophageal reflux disease)    occasionally but no meds required   History of blood transfusion    History of colon polyps    Hyperlipidemia    takes Lipitor every other day   Hypertension    takes losartan daily   Neuropathy    Osteoarthritis of left knee 04/27/2014   Restless leg    Shortness of breath    with exertion   SOB (shortness of breath) 2013   CPET   Tear of medial meniscus of left knee 09/08/2013   Past Surgical History:  Procedure Laterality Date   APPENDECTOMY     BACK SURGERY  07/20/2008   lumb fusion   COLONOSCOPY  2018   EYE SURGERY     both cataracts   KNEE ARTHROSCOPY WITH MEDIAL MENISECTOMY Left 09/08/2013   Procedure: LEFT KNEE ARTHROSCOPY WITH PARTIAL MEDIAL MENISCECTOMY;  Surgeon: Eulas Post, MD;  Location: [facility];  Service: Orthopedics;  Laterality: Left;   LUMBAR LAMINECTOMY/DECOMPRESSION MICRODISCECTOMY  05/13/2012   Procedure: LUMBAR LAMINECTOMY/DECOMPRESSION MICRODISCECTOMY 1 LEVEL;  Surgeon: Barnett Abu, MD;  Location: [facility];  Service: Neurosurgery;  Laterality: Bilateral;  Bilateral Lumbar one -two Decompressive Laminectomy   PARTIAL  KNEE ARTHROPLASTY Left 04/27/2014   Procedure: LEFT UNICOMPARTMENTAL KNEE;  Surgeon: Eulas Post, MD;  Location: [facility];  Service: Orthopedics;  Laterality: Left;   TOTAL KNEE ARTHROPLASTY Right 11/06/2022   Procedure: RIGHT TOTAL KNEE ARTHROPLASTY;  Surgeon: Kathryne Hitch, MD;  Location: [facility];  Service: Orthopedics;  Laterality: Right;   TUBAL LIGATION  1974   UPPER GI ENDOSCOPY     Patient Active Problem List   Diagnosis Date Noted   Status post total right knee replacement 11/06/2022   Arthritis of right acromioclavicular joint 08/28/2021   Impingement syndrome of right shoulder 08/19/2021   Nontraumatic incomplete tear of right rotator cuff 08/19/2021   Paresthesia 12/05/2020   Low back pain with right-sided sciatica 12/05/2020   Trochanteric bursitis, right hip 04/24/2020   Essential hypertension 01/12/2020   Sinus bradycardia 01/12/2020   Dyspnea on exertion 01/12/2020   Lumbar stenosis with neurogenic claudication 02/01/2017   Osteoarthritis of left knee 04/27/2014    PCP: Merri Brunette MD   REFERRING PROVIDER: Kathryne Hitch, MD  REFERRING DIAG: [phone] (ICD-10-CM) - Status post total right knee replacement M17.11 (ICD-10-CM) - Unilateral primary osteoarthritis, right knee  THERAPY DIAG:  Stiffness of right knee, not elsewhere classified  Acute pain of right knee  Localized edema  Muscle weakness (generalized)  Difficulty in walking, not elsewhere classified  Rationale for Evaluation and Treatment: Rehabilitation  ONSET DATE: 11/09/2022  SUBJECTIVE:   SUBJECTIVE STATEMENT: She had rough nights.  She has to sleep upright.  Her knee is tight / stiff.   PERTINENT HISTORY: Hospital Course: LATASH NOURI is an 82 y.o. female who was admitted 11/06/2022 for operative treatment ofUnilateral primary osteoarthritis, right knee. Patient has severe unremitting pain that affects sleep, daily activities, and work/hobbies. After  pre-op clearance the patient was taken to the operating room on 11/06/2022 and underwent  Procedure(s): RIGHT TOTAL KNEE ARTHROPLASTY.   PMH: OA, right rotator cuff tear, LBP w/ sciatica, lumbar stenosis, back surgery lumbar fusion & discectomy,  right hip bursitis, HEN, bradycardia,    PAIN:  NPRS scale: today 6/10.  Pain location: Rt knee  Pain description: sharp  Aggravating factors: doing too much the knee  Relieving factors: unsure, maybe ice   PRECAUTIONS: None  WEIGHT BEARING RESTRICTIONS: No  FALLS:  Has patient fallen in last 6 months? No  LIVING ENVIRONMENT: Lives with: lives with their spouse Lives in: House/apartment Stairs: 4 STE B rails, split level home with 6 steps up/6 steps down U rail  Has following equipment at home: Single point cane, Walker - 2 wheeled, and toilet raiser   OCCUPATION: retired   PLOF: Independent, Independent with basic ADLs, Independent with gait, and Independent with transfers  PATIENT GOALS: be able to walk without pain  NEXT MD VISIT: manipulation 12/24/2022  OBJECTIVE: (objective measures completed at initial evaluation unless otherwise dated)   PATIENT SURVEYS:  12/29/2022: visit 17 45%  12/15/22 visit 10: 41%  11/20/2022 FOTO 55, predicted 61  COGNITION: 11/20/2022 Overall cognitive status:  possible STM impairment, family not present to confirm    SENSATION: 11/20/2022 Not tested  EDEMA:  11/20/2022  Some excessive pitting edema in surgical calf, no temperature or color changes, Homan's negative; on daily Xarelto per her report and per chart, she tells me she has been compliant with this medication   LOWER EXTREMITY ROM:  Active ROM Right eval Right 11/23/22 Right  11/30/22 Right 12/07/22 Right 12/15/22 Right 12/18/22 Right 12/24/22 S/p manipulation Right 12/28/22 Right 12/30/22 Right 12/31/22  Right 01/04/23 Right 01/06/23  Hip flexion              Hip extension              Hip abduction              Hip adduction               Hip internal rotation              Hip external rotation              Knee flexion 64* supine  Seated P: 81* Seated 64-71* PROM and AAROM Seated A: 71* P: 82* Supine  A: 63*, AAROM in sitting 62*, passive 74* Seated AROM in knee flexion stretch 83* Supine P: 94*  Supine  With LE  94* Seated P: 95* AAROM 96* seated PROM  Seated 96* End of session PROM  Seated 96* End of session  Knee extension 16* supine with heel prop  Supine With heel prop P: -14* Seated with foot supported on stool 9* PROM Supine  A: Quad set -7* Supine with towel roll under heel 22*, seated PROM 12*  Supine P: -9* Standing P with  mob belt -10* Standing with mob belt P: -3*  Standing with mob belt P: -3* Standing with mob belt P: -3* Standing A: -11*  Ankle dorsiflexion              Ankle plantarflexion              Ankle inversion              Ankle eversion               (Blank rows = not tested)  LOWER EXTREMITY MMT:  MMT Right eval Left eval Right 12/15/22 Left 12/15/22  Hip flexion 3+ 4+ 4+ 4+  Hip extension      Hip abduction      Hip adduction      Hip internal rotation      Hip external rotation      Knee flexion 4 4 4- 4-  Knee extension 2 4 4 5   Ankle dorsiflexion 5 5    Ankle plantarflexion      Ankle inversion      Ankle eversion       (Blank rows = not tested)    FUNCTIONAL TESTS:  12/15/2022: 5 times sit to stand: 15.5 seconds no UEs, severe offshift from R LE/compensations Timed up and go (TUG): 14.7 seconds SPC   3 minute walk test: [facility]    11/20/2022 5 times sit to stand: 43 seconds no UEs; Timed up and go (TUG): 19.5 seconds SPC;   3 minute walk test: 36ft SPC;   GAIT: 12/23/2022: pt amb with cane with antalgic pattern with knee flexion in stance and minimal increase flexion for swing.   11/20/2022 / Evaluation: Distance walked: 345ft Assistive device utilized: Single point cane Level of assistance: Modified independence Comments: slow but steady, able  to make sharp turns well at each end of walking course, used SPC on wrong side, foot flat and limited heel-toe pattern, limited R knee flexion/extension in stance/swing phases                     TODAY'S TREATMENT:                                                                                              DATE: 01/11/2023: Therex: PT discussed beginning to use community fitness like [employer] or [employer] (had previous membership) 1x/wk to begin transition.   UBE UE/LE full revolution lvl 3.0 8 mins for ROM, seat 11 PT demo & verbal cues on set up of recumbent stepper including seat. Pt performed 2 min while instructed. PT recommending starting with 5 min sets with timed rests for 15-20 min work. Then weaning down rests.  Pt verbalized understanding. Leg press Double leg 2 x 15 81 lbs, single leg 37 lbs 2 x 10 with 30 sec rest breaks into flexion after each set.  Gastroc stretch on step heel depression 30 sec hold 3 reps Heel raise RUE support reaching upward with LUE 15 reps Standing TKE 10 reps.  Practice moving RLE gas to brake in car while running but in park.  Increase speed up to sudden stop as tolerated. Pt verbalized understanding.   Manual: Seated Rt knee flexion c mobilization c movement IR/distraction.  Contract relax c flexion stretching  Extended time in manual for mobility gains.    TREATMENT:                                                                                              DATE: 01/08/2023 Therex: UBE UE/LE full revolution lvl 2.0 8 mins for ROM, seat 11 Leg press Double leg 2 x 15 75 lbs, single leg 31 lbs 2 x 10 with 30 sec rest breaks into flexion after each set.  Gastroc stretch on incline board 30 sec hold 3 reps Supine heel prop to tolerance with start of vaso -  Manual: Seated Rt knee flexion c mobilization c movement IR/distraction.  Contract relax c flexion stretching  Extended time in manual for mobility gains.    Vasopneumatic 10 mins Rt knee high  compression 34 deg in elevation c ankle pumps throughout   TREATMENT:                                                                                              DATE: 01/07/23 Therapeutic Exercise: Long sitting quad set assist from PT for positioning to increase ROM, 2x10  Mini squat at counter for support 3x5 seated rest between, cues for support and appropriate ROM/symmetry Standing hamstring curls unweighted 2x10 UE support at counter Mini lunge weight shifts at counter w/ UE support for knee ROM, 3x5  Manual Therapy: Seated edge of mat; passive physiological movement R knee flex/ext to pt tolerance, oscillations for reduced muscle guarding, gentle distraction to improve knee flex tolerance STM R quad, seated      HOME EXERCISE PROGRAM: Access Code: [email or web address] URL: [email or web address] Date: 11/23/2022 Prepared by: Vladimir Faster  Exercises - Ankle Alphabet in Elevation  - 2-4 x daily - 7 x weekly - 1 sets - 1 reps - Supine Single Leg Ankle Pumps  - 1 x daily - 5 x weekly - 1 sets - 10 reps - 5 seconds hold - Quad Setting and Stretching  - 2-4 x daily - 7 x weekly - 5-10 sets - 10 reps - 5 sec hold - Supine Heel Slide with Strap  - 2-3 x daily - 7 x weekly - 2-3 sets - 10 reps - 5 seconds hold - Supine Knee Extension Strengthening  - 1 x daily - 7 x weekly - 2-3 sets - 10 reps - 5 seconds hold - Supine Straight Leg Raises  - 2-3 x daily - 7 x weekly - 2-3 sets - 10 reps - 5 seconds hold - Seated Knee Flexion Extension AROM   - 2-4 x daily -  7 x weekly - 2-3 sets - 10 reps - 5 seconds hold - Seated straight leg lifts  - 2-3 x daily - 7 x weekly - 2-3 sets - 10 reps - 5 seconds hold - Seated Hamstring Stretch with Strap  - 2-4 x daily - 7 x weekly - 1 sets - 3 reps - 20-30 seconds hold - Seated Long Arc Quad  - 1 x daily - 7 x weekly - 2-3 sets - 10 reps - 5 seconds hold - Heel raises near counter  - 1 x daily - 5 x weekly - 1 sets - 10 reps - 5 seconds hold -  standing calf stretch with forefoot on small step or brick  - 1 x daily - 7 x weekly - 1 sets - 3 reps - 20-30 seconds hold - Standing Knee Flexion with Counter Support  - 1 x daily - 7 x weekly - 12-3 sets - 10 reps - 5 seconds hold - Standing March with Counter Support  - 1 x daily - 7 x weekly - 2-3 sets - 10 reps - 5 seconds hold - standing knee extension  - 1 x daily - 7 x weekly - 2-3 sets - 10 reps - 5 seconds hold   ASSESSMENT: CLINICAL IMPRESSION: PT began discussion on using community fitness center post PT discharge 2-3xwk with recommendation to begin 1x/wk while in PT for education.   Pt verbalized understanding.   OBJECTIVE IMPAIRMENTS: Abnormal gait, decreased activity tolerance, decreased balance, decreased coordination, decreased mobility, difficulty walking, decreased ROM, decreased strength, hypomobility, increased edema, increased fascial restrictions, impaired flexibility, impaired UE functional use, and pain.   ACTIVITY LIMITATIONS: sitting, standing, squatting, stairs, transfers, bed mobility, and locomotion level  PARTICIPATION LIMITATIONS: cleaning, laundry, driving, shopping, community activity, occupation, and yard work  PERSONAL FACTORS: Age, Behavior pattern, Education, Fitness, Past/current experiences, Social background, and Time since onset of injury/illness/exacerbation are also affecting patient's functional outcome.   REHAB POTENTIAL: Good  CLINICAL DECISION MAKING: Stable/uncomplicated  EVALUATION COMPLEXITY: Low   GOALS: Goals reviewed with patient? Yes  SHORT TERM GOALS: Target date: 01/15/2023   Will be compliant with appropriate progressive HEP  Baseline: see objective data Goal status: ongoing 12/23/2022  2.  R knee PROM ext -5* and flexion 100* Baseline: see objective data Goal status: ongoing 01/06/2023  3.  Will be independent with management techniques for localized edema including wearing compression stockings and appropriate ice routine   Baseline: see objective data Goal status: ongoing 01/06/2023   4.  Patient ambulates with cane with knee ext in stance & flexion in swing within 75% of normal range. Baseline: see objective data Goal status: ongoing 01/06/2023   LONG TERM GOALS: Target date: 02/04/2023    MMT right knee >80% of LLE with HH dynameter Baseline: see objective data Goal status: ongoing 12/23/2022  2.  R knee flexion AROM to be at least 105 degrees  Baseline: see objective data Goal status: ongoing 12/23/2022  3.  Will be able to ascend/descend steps with single rail and reciprocal pattern, no increase in pain  Baseline: see objective data Goal status: ongoing 12/23/2022  4.  Will complete 5x sit to stand test in 15 seconds or less no UEs  Baseline: see objective data Goal status: ongoing 12/23/2022  5.  Will complete TUG test in 13 seconds or less with LRAD to show improved functional balance Baseline: see objective data Goal status: ongoing 12/23/2022  6.  Will ambulate > 527ft with cane &  neg ramp / curb modified independent to show improved mobility and community access  Baseline: see objective data Goal status:ongoing 12/23/2022   PLAN:  PT FREQUENCY: 2-5x/wk (daily when clinic open for 2 weeks post manipulation)  PT DURATION: 6 weeks  PLANNED INTERVENTIONS: Therapeutic exercises, Therapeutic activity, Neuromuscular re-education, Balance training, Gait training, Patient/Family education, Self Care, Joint mobilization, Stair training, Orthotic/Fit training, DME instructions, Aquatic Therapy, Dry Needling, Electrical stimulation, Cryotherapy, Moist heat, Taping, Vasopneumatic device, Ultrasound, Ionotophoresis 4mg /ml Dexamethasone, Manual therapy, and Re-evaluation  PLAN FOR NEXT SESSION: assess STGs including check range, check if joined [employer] or [employer].   Gain motion, improve ambulation (FWW to [facility]).    Vladimir Faster, PT, DPT 01/11/2023, 12:40 PM

## 2023-01-13 ENCOUNTER — Ambulatory Visit (INDEPENDENT_AMBULATORY_CARE_PROVIDER_SITE_OTHER): Payer: Medicare Other | Admitting: Physical Therapy

## 2023-01-13 ENCOUNTER — Encounter: Payer: Self-pay | Admitting: Physical Therapy

## 2023-01-13 DIAGNOSIS — M25661 Stiffness of right knee, not elsewhere classified: Secondary | ICD-10-CM

## 2023-01-13 DIAGNOSIS — R600 Localized edema: Secondary | ICD-10-CM

## 2023-01-13 DIAGNOSIS — M25561 Pain in right knee: Secondary | ICD-10-CM | POA: Diagnosis not present

## 2023-01-13 DIAGNOSIS — R262 Difficulty in walking, not elsewhere classified: Secondary | ICD-10-CM

## 2023-01-13 DIAGNOSIS — M6281 Muscle weakness (generalized): Secondary | ICD-10-CM

## 2023-01-13 NOTE — Patient Instructions (Signed)
Start at [employer] using recumbent stepper working 5 min rest 5 min for 3 sets.  Slowly increase work time & decrease rest time until you can work 20-30 min straight.  Set seat where your foot stays on plate when you push outward.  If the bending is not stretching at this seat position then move 1 notch closer.

## 2023-01-13 NOTE — Therapy (Signed)
OUTPATIENT PHYSICAL THERAPY TREATMENT   Patient Name: Melissa Maldonado MRN: [id number] DOB:[birth year], 82 y.o., female Today's Date: 01/13/2023   END OF SESSION:  PT End of Session - 01/13/23 1140     Visit Number 27    Number of Visits 40    Date for PT Re-Evaluation 02/04/23    Authorization Type MCR and BCBS [place] - [place]    Authorization Time Period 11/20/22 to 01/15/23    Progress Note Due on Visit 33    PT Start Time 1142    PT Stop Time 1236    PT Time Calculation (min) 54 min    Activity Tolerance Patient tolerated treatment well    Behavior During Therapy WFL for tasks assessed/performed                  Past Medical History:  Diagnosis Date   Arthritis    Chronic back pain    lumbar stenosis   Dysrhythmia    Early cataracts, bilateral    GERD (gastroesophageal reflux disease)    occasionally but no meds required   History of blood transfusion    History of colon polyps    Hyperlipidemia    takes Lipitor every other day   Hypertension    takes losartan daily   Neuropathy    Osteoarthritis of left knee 04/27/2014   Restless leg    Shortness of breath    with exertion   SOB (shortness of breath) 2013   CPET   Tear of medial meniscus of left knee 09/08/2013   Past Surgical History:  Procedure Laterality Date   APPENDECTOMY     BACK SURGERY  07/20/2008   lumb fusion   COLONOSCOPY  2018   EYE SURGERY     both cataracts   KNEE ARTHROSCOPY WITH MEDIAL MENISECTOMY Left 09/08/2013   Procedure: LEFT KNEE ARTHROSCOPY WITH PARTIAL MEDIAL MENISCECTOMY;  Surgeon: Eulas Post, MD;  Location: [facility];  Service: Orthopedics;  Laterality: Left;   LUMBAR LAMINECTOMY/DECOMPRESSION MICRODISCECTOMY  05/13/2012   Procedure: LUMBAR LAMINECTOMY/DECOMPRESSION MICRODISCECTOMY 1 LEVEL;  Surgeon: Barnett Abu, MD;  Location: [facility];  Service: Neurosurgery;  Laterality: Bilateral;  Bilateral Lumbar one -two Decompressive Laminectomy   PARTIAL  KNEE ARTHROPLASTY Left 04/27/2014   Procedure: LEFT UNICOMPARTMENTAL KNEE;  Surgeon: Eulas Post, MD;  Location: [facility];  Service: Orthopedics;  Laterality: Left;   TOTAL KNEE ARTHROPLASTY Right 11/06/2022   Procedure: RIGHT TOTAL KNEE ARTHROPLASTY;  Surgeon: Kathryne Hitch, MD;  Location: [facility];  Service: Orthopedics;  Laterality: Right;   TUBAL LIGATION  1974   UPPER GI ENDOSCOPY     Patient Active Problem List   Diagnosis Date Noted   Status post total right knee replacement 11/06/2022   Arthritis of right acromioclavicular joint 08/28/2021   Impingement syndrome of right shoulder 08/19/2021   Nontraumatic incomplete tear of right rotator cuff 08/19/2021   Paresthesia 12/05/2020   Low back pain with right-sided sciatica 12/05/2020   Trochanteric bursitis, right hip 04/24/2020   Essential hypertension 01/12/2020   Sinus bradycardia 01/12/2020   Dyspnea on exertion 01/12/2020   Lumbar stenosis with neurogenic claudication 02/01/2017   Osteoarthritis of left knee 04/27/2014    PCP: Melissa Brunette MD   REFERRING PROVIDER: Kathryne Hitch, MD  REFERRING DIAG: [phone] (ICD-10-CM) - Status post total right knee replacement M17.11 (ICD-10-CM) - Unilateral primary osteoarthritis, right knee  THERAPY DIAG:  Stiffness of right knee, not elsewhere classified  Acute pain of right knee  Localized edema  Muscle weakness (generalized)  Difficulty in walking, not elsewhere classified  Rationale for Evaluation and Treatment: Rehabilitation  ONSET DATE: 11/09/2022  SUBJECTIVE:   SUBJECTIVE STATEMENT: She joined [employer] and plans to start going tomorrow.   PERTINENT HISTORY: Hospital Course: Melissa Maldonado is an 82 y.o. female who was admitted 11/06/2022 for operative treatment ofUnilateral primary osteoarthritis, right knee. Patient has severe unremitting pain that affects sleep, daily activities, and work/hobbies. After pre-op clearance  the patient was taken to the operating room on 11/06/2022 and underwent  Procedure(s): RIGHT TOTAL KNEE ARTHROPLASTY.   PMH: OA, right rotator cuff tear, LBP w/ sciatica, lumbar stenosis, back surgery lumbar fusion & discectomy,  right hip bursitis, HEN, bradycardia,    PAIN:  NPRS scale: today 5/10.  Pain location: Rt knee  Pain description: sharp  Aggravating factors: doing too much the knee  Relieving factors: unsure, maybe ice   PRECAUTIONS: None  WEIGHT BEARING RESTRICTIONS: No  FALLS:  Has patient fallen in last 6 months? No  LIVING ENVIRONMENT: Lives with: lives with their spouse Lives in: House/apartment Stairs: 4 STE B rails, split level home with 6 steps up/6 steps down U rail  Has following equipment at home: Single point cane, Walker - 2 wheeled, and toilet raiser   OCCUPATION: retired   PLOF: Independent, Independent with basic ADLs, Independent with gait, and Independent with transfers  PATIENT GOALS: be able to walk without pain  NEXT MD VISIT: manipulation 12/24/2022  OBJECTIVE: (objective measures completed at initial evaluation unless otherwise dated)   PATIENT SURVEYS:  12/29/2022: visit 17 45%  12/15/22 visit 10: 41%  11/20/2022 FOTO 55, predicted 61  COGNITION: 11/20/2022 Overall cognitive status:  possible STM impairment, family not present to confirm    SENSATION: 11/20/2022 Not tested  EDEMA:  11/20/2022  Some excessive pitting edema in surgical calf, no temperature or color changes, Homan's negative; on daily Xarelto per her report and per chart, she tells me she has been compliant with this medication   LOWER EXTREMITY ROM:  Active ROM Right eval Right 11/23/22 Right  11/30/22 Right 12/07/22 Right 12/15/22 Right 12/18/22 Right 12/24/22 S/p manipulation Right 12/28/22 Right 12/30/22 Right 12/31/22  Right 01/04/23 Right 01/06/23 Right 01/13/23  Hip flexion               Hip extension               Hip abduction               Hip adduction                Hip internal rotation               Hip external rotation               Knee flexion 64* supine  Seated P: 81* Seated 64-71* PROM and AAROM Seated A: 71* P: 82* Supine  A: 63*, AAROM in sitting 62*, passive 74* Seated AROM in knee flexion stretch 83* Supine P: 94*  Supine  With LE  94* Seated P: 95* AAROM 96* seated PROM  Seated 96* End of session PROM  Seated 96* End of session Supine PROM 70*  Knee extension 16* supine with heel prop  Supine With heel prop P: -14* Seated with foot supported on stool 9* PROM Supine  A: Quad set -7* Supine with towel roll under heel 22*, seated PROM 12*  Supine P: -  9* Standing P with mob belt -10* Standing with mob belt P: -3*  Standing with mob belt P: -3* Standing with mob belt P: -3* Standing A: -11* Supine P: -12*  Ankle dorsiflexion               Ankle plantarflexion               Ankle inversion               Ankle eversion                (Blank rows = not tested)  LOWER EXTREMITY MMT:  MMT Right eval Left eval Right 12/15/22 Left 12/15/22  Hip flexion 3+ 4+ 4+ 4+  Hip extension      Hip abduction      Hip adduction      Hip internal rotation      Hip external rotation      Knee flexion 4 4 4- 4-  Knee extension 2 4 4 5   Ankle dorsiflexion 5 5    Ankle plantarflexion      Ankle inversion      Ankle eversion       (Blank rows = not tested)    FUNCTIONAL TESTS:  12/15/2022: 5 times sit to stand: 15.5 seconds no UEs, severe offshift from R LE/compensations Timed up and go (TUG): 14.7 seconds SPC   3 minute walk test: 553ft [facility]    11/20/2022 5 times sit to stand: 43 seconds no UEs; Timed up and go (TUG): 19.5 seconds SPC;   3 minute walk test: 349ft SPC;   GAIT: 12/23/2022: pt amb with cane with antalgic pattern with knee flexion in stance and minimal increase flexion for swing.   11/20/2022 / Evaluation: Distance walked: 331ft Assistive device utilized: Single point cane Level of assistance: Modified  independence Comments: slow but steady, able to make sharp turns well at each end of walking course, used SPC on wrong side, foot flat and limited heel-toe pattern, limited R knee flexion/extension in stance/swing phases                     TODAY'S TREATMENT:                                                                                              DATE:  01/13/2023 Therex: Start at [employer] using recumbent stepper working 5 min rest 5 min for 3 sets.  Slowly increase work time & decrease rest time until you can work 20-30 min straight.  Set seat where your foot stays on plate when you push outward.  If the bending is not stretching at this seat position then move 1 notch closer. PT printed & pt verbalized understanding.  UBE UE/LE full revolution lvl 3.0 8 mins for ROM, seat 11 Leg press Double leg 2 x 15 81 lbs, single leg 37 lbs 2 x 10 with 30 sec rest breaks into flexion after each set.  Supine Heel slide with ball & strap with PT overpressure end range flexion & extension 10 reps  Manual: Seated Rt knee  flexion c mobilization c movement IR/distraction.  Contract relax c flexion stretching  Extended time in manual for mobility gains. Hypervolt to quads   Vasopneumatic 10 mins Rt knee high compression 34 deg in elevation  TREATMENT:                                                                                              DATE: 01/11/2023: Therex: PT discussed beginning to use community fitness like [employer] or [employer] (had previous membership) 1x/wk to begin transition.   UBE UE/LE full revolution lvl 3.0 8 mins for ROM, seat 11 PT demo & verbal cues on set up of recumbent stepper including seat. Pt performed 2 min while instructed. PT recommending starting with 5 min sets with timed rests for 15-20 min work. Then weaning down rests.  Pt verbalized understanding. Leg press Double leg 2 x 15 81 lbs, single leg 37 lbs 2 x 10 with 30 sec rest breaks into flexion after each set.   Gastroc stretch on step heel depression 30 sec hold 3 reps Heel raise RUE support reaching upward with LUE 15 reps Standing TKE 10 reps.  Practice moving RLE gas to brake in car while running but in park.  Increase speed up to sudden stop as tolerated. Pt verbalized understanding.   Manual: Seated Rt knee flexion c mobilization c movement IR/distraction.  Contract relax c flexion stretching  Extended time in manual for mobility gains.    TREATMENT:                                                                                              DATE: 01/08/2023 Therex: UBE UE/LE full revolution lvl 2.0 8 mins for ROM, seat 11 Leg press Double leg 2 x 15 75 lbs, single leg 31 lbs 2 x 10 with 30 sec rest breaks into flexion after each set.  Gastroc stretch on incline board 30 sec hold 3 reps Supine heel prop to tolerance with start of vaso -  Manual: Seated Rt knee flexion c mobilization c movement IR/distraction.  Contract relax c flexion stretching  Extended time in manual for mobility gains.    Vasopneumatic 10 mins Rt knee high compression 34 deg in elevation c ankle pumps throughout     HOME EXERCISE PROGRAM: Access Code: [email or web address] URL: [email or web address] Date: 11/23/2022 Prepared by: Vladimir Faster  Exercises - Ankle Alphabet in Elevation  - 2-4 x daily - 7 x weekly - 1 sets - 1 reps - Supine Single Leg Ankle Pumps  - 1 x daily - 5 x weekly - 1 sets - 10 reps - 5 seconds hold - Quad Setting and Stretching  - 2-4 x daily - 7 x weekly - 5-10 sets - 10 reps -  5 sec hold - Supine Heel Slide with Strap  - 2-3 x daily - 7 x weekly - 2-3 sets - 10 reps - 5 seconds hold - Supine Knee Extension Strengthening  - 1 x daily - 7 x weekly - 2-3 sets - 10 reps - 5 seconds hold - Supine Straight Leg Raises  - 2-3 x daily - 7 x weekly - 2-3 sets - 10 reps - 5 seconds hold - Seated Knee Flexion Extension AROM   - 2-4 x daily - 7 x weekly - 2-3 sets - 10 reps - 5 seconds hold -  Seated straight leg lifts  - 2-3 x daily - 7 x weekly - 2-3 sets - 10 reps - 5 seconds hold - Seated Hamstring Stretch with Strap  - 2-4 x daily - 7 x weekly - 1 sets - 3 reps - 20-30 seconds hold - Seated Long Arc Quad  - 1 x daily - 7 x weekly - 2-3 sets - 10 reps - 5 seconds hold - Heel raises near counter  - 1 x daily - 5 x weekly - 1 sets - 10 reps - 5 seconds hold - standing calf stretch with forefoot on small step or brick  - 1 x daily - 7 x weekly - 1 sets - 3 reps - 20-30 seconds hold - Standing Knee Flexion with Counter Support  - 1 x daily - 7 x weekly - 12-3 sets - 10 reps - 5 seconds hold - Standing March with Counter Support  - 1 x daily - 7 x weekly - 2-3 sets - 10 reps - 5 seconds hold - standing knee extension  - 1 x daily - 7 x weekly - 2-3 sets - 10 reps - 5 seconds hold   ASSESSMENT: CLINICAL IMPRESSION: Pt continues to have range limited by pain and fibrous tightness in knee.  PT began instruction   Pt verbalized understanding.   OBJECTIVE IMPAIRMENTS: Abnormal gait, decreased activity tolerance, decreased balance, decreased coordination, decreased mobility, difficulty walking, decreased ROM, decreased strength, hypomobility, increased edema, increased fascial restrictions, impaired flexibility, impaired UE functional use, and pain.   ACTIVITY LIMITATIONS: sitting, standing, squatting, stairs, transfers, bed mobility, and locomotion level  PARTICIPATION LIMITATIONS: cleaning, laundry, driving, shopping, community activity, occupation, and yard work  PERSONAL FACTORS: Age, Behavior pattern, Education, Fitness, Past/current experiences, Social background, and Time since onset of injury/illness/exacerbation are also affecting patient's functional outcome.   REHAB POTENTIAL: Good  CLINICAL DECISION MAKING: Stable/uncomplicated  EVALUATION COMPLEXITY: Low   GOALS: Goals reviewed with patient? Yes  SHORT TERM GOALS: Target date: 01/15/2023   Will be compliant with  appropriate progressive HEP  Baseline: see objective data Goal status: MET 01/13/2023  2.  R knee PROM ext -5* and flexion 100* Baseline: see objective data Goal status: not met 01/13/2023  3.  Will be independent with management techniques for localized edema including wearing compression stockings and appropriate ice routine  Baseline: see objective data Goal status: MET 01/06/2023  4.  Patient ambulates with cane with knee ext in stance & flexion in swing within 75% of normal range. Baseline: see objective data Goal status: not met 01/13/2023   LONG TERM GOALS: Target date: 02/04/2023    MMT right knee >80% of LLE with HH dynameter Baseline: see objective data Goal status: ongoing 12/23/2022  2.  R knee flexion AROM to be at least 105 degrees  Baseline: see objective data Goal status: ongoing 12/23/2022  3.  Will be able  to ascend/descend steps with single rail and reciprocal pattern, no increase in pain  Baseline: see objective data Goal status: ongoing 12/23/2022  4.  Will complete 5x sit to stand test in 15 seconds or less no UEs  Baseline: see objective data Goal status: ongoing 12/23/2022  5.  Will complete TUG test in 13 seconds or less with LRAD to show improved functional balance Baseline: see objective data Goal status: ongoing 12/23/2022  6.  Will ambulate > 518ft with cane & neg ramp / curb modified independent to show improved mobility and community access  Baseline: see objective data Goal status:ongoing 12/23/2022   PLAN:  PT FREQUENCY: 2-5x/wk (daily when clinic open for 2 weeks post manipulation)  PT DURATION: 6 weeks  PLANNED INTERVENTIONS: Therapeutic exercises, Therapeutic activity, Neuromuscular re-education, Balance training, Gait training, Patient/Family education, Self Care, Joint mobilization, Stair training, Orthotic/Fit training, DME instructions, Aquatic Therapy, Dry Needling, Electrical stimulation, Cryotherapy, Moist heat, Taping, Vasopneumatic  device, Ultrasound, Ionotophoresis 4mg /ml Dexamethasone, Manual therapy, and Re-evaluation  PLAN FOR NEXT SESSION:   manual therapy including soft tissue work and therapeutic exercise for range.  Check how [employer] stepper use went.  Vaso to end.  Vladimir Faster, PT, DPT 01/13/2023, 12:35 PM

## 2023-01-14 NOTE — Therapy (Signed)
OUTPATIENT PHYSICAL THERAPY TREATMENT   Patient Name: Melissa Maldonado MRN: [id number] DOB:1941/07/01, 82 y.o., female Today's Date: 01/15/2023   END OF SESSION:  PT End of Session - 01/15/23 1104     Visit Number 28    Number of Visits 40    Date for PT Re-Evaluation 02/04/23    Authorization Type MCR and BCBS [place] - [place]    Authorization Time Period 11/20/22 to 01/15/23    Progress Note Due on Visit 33    PT Start Time 1103    PT Stop Time 1150   vaso   PT Time Calculation (min) 47 min    Activity Tolerance Patient tolerated treatment well    Behavior During Therapy WFL for tasks assessed/performed                   Past Medical History:  Diagnosis Date   Arthritis    Chronic back pain    lumbar stenosis   Dysrhythmia    Early cataracts, bilateral    GERD (gastroesophageal reflux disease)    occasionally but no meds required   History of blood transfusion    History of colon polyps    Hyperlipidemia    takes Lipitor every other day   Hypertension    takes losartan daily   Neuropathy    Osteoarthritis of left knee 04/27/2014   Restless leg    Shortness of breath    with exertion   SOB (shortness of breath) 2013   CPET   Tear of medial meniscus of left knee 09/08/2013   Past Surgical History:  Procedure Laterality Date   APPENDECTOMY     BACK SURGERY  07/20/2008   lumb fusion   COLONOSCOPY  2018   EYE SURGERY     both cataracts   KNEE ARTHROSCOPY WITH MEDIAL MENISECTOMY Left 09/08/2013   Procedure: LEFT KNEE ARTHROSCOPY WITH PARTIAL MEDIAL MENISCECTOMY;  Surgeon: Eulas Post, MD;  Location: [facility];  Service: Orthopedics;  Laterality: Left;   LUMBAR LAMINECTOMY/DECOMPRESSION MICRODISCECTOMY  05/13/2012   Procedure: LUMBAR LAMINECTOMY/DECOMPRESSION MICRODISCECTOMY 1 LEVEL;  Surgeon: Barnett Abu, MD;  Location: [facility];  Service: Neurosurgery;  Laterality: Bilateral;  Bilateral Lumbar one -two Decompressive Laminectomy    PARTIAL KNEE ARTHROPLASTY Left 04/27/2014   Procedure: LEFT UNICOMPARTMENTAL KNEE;  Surgeon: Eulas Post, MD;  Location: [facility];  Service: Orthopedics;  Laterality: Left;   TOTAL KNEE ARTHROPLASTY Right 11/06/2022   Procedure: RIGHT TOTAL KNEE ARTHROPLASTY;  Surgeon: Kathryne Hitch, MD;  Location: [facility];  Service: Orthopedics;  Laterality: Right;   TUBAL LIGATION  1974   UPPER GI ENDOSCOPY     Patient Active Problem List   Diagnosis Date Noted   Status post total right knee replacement 11/06/2022   Arthritis of right acromioclavicular joint 08/28/2021   Impingement syndrome of right shoulder 08/19/2021   Nontraumatic incomplete tear of right rotator cuff 08/19/2021   Paresthesia 12/05/2020   Low back pain with right-sided sciatica 12/05/2020   Trochanteric bursitis, right hip 04/24/2020   Essential hypertension 01/12/2020   Sinus bradycardia 01/12/2020   Dyspnea on exertion 01/12/2020   Lumbar stenosis with neurogenic claudication 02/01/2017   Osteoarthritis of left knee 04/27/2014    PCP: Merri Brunette MD   REFERRING PROVIDER: Kathryne Hitch, MD  REFERRING DIAG: [phone] (ICD-10-CM) - Status post total right knee replacement M17.11 (ICD-10-CM) - Unilateral primary osteoarthritis, right knee  THERAPY DIAG:  Stiffness of right knee,  not elsewhere classified  Acute pain of right knee  Localized edema  Muscle weakness (generalized)  Difficulty in walking, not elsewhere classified  Rationale for Evaluation and Treatment: Rehabilitation  ONSET DATE: 11/09/2022  SUBJECTIVE:   SUBJECTIVE STATEMENT: Doing about the same overall, states she went to [employer] and it went well. No pain at present, just stiffness.    PERTINENT HISTORY: Hospital Course: Melissa Maldonado is an 82 y.o. female who was admitted 11/06/2022 for operative treatment ofUnilateral primary osteoarthritis, right knee. Patient has severe unremitting pain that  affects sleep, daily activities, and work/hobbies. After pre-op clearance the patient was taken to the operating room on 11/06/2022 and underwent  Procedure(s): RIGHT TOTAL KNEE ARTHROPLASTY.   PMH: OA, right rotator cuff tear, LBP w/ sciatica, lumbar stenosis, back surgery lumbar fusion & discectomy,  right hip bursitis, HEN, bradycardia,    PAIN:  NPRS scale: today 0/10.  Pain location: Rt knee  Pain description: sharp  Aggravating factors: doing too much the knee  Relieving factors: unsure, maybe ice   PRECAUTIONS: None  WEIGHT BEARING RESTRICTIONS: No  FALLS:  Has patient fallen in last 6 months? No  LIVING ENVIRONMENT: Lives with: lives with their spouse Lives in: House/apartment Stairs: 4 STE B rails, split level home with 6 steps up/6 steps down U rail  Has following equipment at home: Single point cane, Walker - 2 wheeled, and toilet raiser   OCCUPATION: retired   PLOF: Independent, Independent with basic ADLs, Independent with gait, and Independent with transfers  PATIENT GOALS: be able to walk without pain  NEXT MD VISIT: manipulation 12/24/2022  OBJECTIVE: (objective measures completed at initial evaluation unless otherwise dated)   PATIENT SURVEYS:  12/29/2022: visit 17 45%  12/15/22 visit 10: 41%  11/20/2022 FOTO 55, predicted 61  COGNITION: 11/20/2022 Overall cognitive status:  possible STM impairment, family not present to confirm    SENSATION: 11/20/2022 Not tested  EDEMA:  11/20/2022  Some excessive pitting edema in surgical calf, no temperature or color changes, Homan's negative; on daily Xarelto per her report and per chart, she tells me she has been compliant with this medication   LOWER EXTREMITY ROM:  Active ROM Right eval Right 11/23/22 Right  11/30/22 Right 12/07/22 Right 12/15/22 Right 12/18/22 Right 12/24/22 S/p manipulation Right 12/28/22 Right 12/30/22 Right 12/31/22  Right 01/04/23 Right 01/06/23 Right 01/13/23  Hip flexion               Hip  extension               Hip abduction               Hip adduction               Hip internal rotation               Hip external rotation               Knee flexion 64* supine  Seated P: 81* Seated 64-71* PROM and AAROM Seated A: 71* P: 82* Supine  A: 63*, AAROM in sitting 62*, passive 74* Seated AROM in knee flexion stretch 83* Supine P: 94*  Supine  With LE  94* Seated P: 95* AAROM 96* seated PROM  Seated 96* End of session PROM  Seated 96* End of session Supine PROM 70*  Knee extension 16* supine with heel prop  Supine With heel prop P: -14* Seated with foot supported on stool 9* PROM Supine  A:  Quad set -7* Supine with towel roll under heel 22*, seated PROM 12*  Supine P: -9* Standing P with mob belt -10* Standing with mob belt P: -3*  Standing with mob belt P: -3* Standing with mob belt P: -3* Standing A: -11* Supine P: -12*  Ankle dorsiflexion               Ankle plantarflexion               Ankle inversion               Ankle eversion                (Blank rows = not tested)  LOWER EXTREMITY MMT:  MMT Right eval Left eval Right 12/15/22 Left 12/15/22  Hip flexion 3+ 4+ 4+ 4+  Hip extension      Hip abduction      Hip adduction      Hip internal rotation      Hip external rotation      Knee flexion 4 4 4- 4-  Knee extension 2 4 4 5   Ankle dorsiflexion 5 5    Ankle plantarflexion      Ankle inversion      Ankle eversion       (Blank rows = not tested)    FUNCTIONAL TESTS:  12/15/2022: 5 times sit to stand: 15.5 seconds no UEs, severe offshift from R LE/compensations Timed up and go (TUG): 14.7 seconds SPC   3 minute walk test: 532ft [facility]    11/20/2022 5 times sit to stand: 43 seconds no UEs; Timed up and go (TUG): 19.5 seconds SPC;   3 minute walk test: 322ft SPC;   GAIT: 12/23/2022: pt amb with cane with antalgic pattern with knee flexion in stance and minimal increase flexion for swing.   11/20/2022 / Evaluation: Distance walked:  317ft Assistive device utilized: Single point cane Level of assistance: Modified independence Comments: slow but steady, able to make sharp turns well at each end of walking course, used SPC on wrong side, foot flat and limited heel-toe pattern, limited R knee flexion/extension in stance/swing phases                     TODAY'S TREATMENT:                                                                                               [facility] Adult PT Treatment:                                                DATE: 01/15/23 Therapeutic Exercise: Sci fit bike UE/LE fwd, 2 min retro, seat at 11 Standing TKE green band with RW support, 2x8 cues for form and reduced hip compensations  Mini lunge weight shifts RLE fwd for inc knee flexion, 2x8 cues for breath control and ROM Seated edge of mat soft ball iso triple ext 2x10 with knee near full extension, knee  at 45 deg  Manual Therapy: Seated edge of mat; passive physiological movement flex/ext R knee, intermittent distraction with flexion to improve ROM, gentle oscillations to reduce muscle guarding; STM to R quad  Modalities: Vaso R knee 5 min, 38 deg medium compression, supine with BIL LE elevated; pt tolerates well without adverse event   DATE:  01/13/2023 Therex: Start at [employer] using recumbent stepper working 5 min rest 5 min for 3 sets.  Slowly increase work time & decrease rest time until you can work 20-30 min straight.  Set seat where your foot stays on plate when you push outward.  If the bending is not stretching at this seat position then move 1 notch closer. PT printed & pt verbalized understanding.  UBE UE/LE full revolution lvl 3.0 8 mins for ROM, seat 11 Leg press Double leg 2 x 15 81 lbs, single leg 37 lbs 2 x 10 with 30 sec rest breaks into flexion after each set.  Supine Heel slide with ball & strap with PT overpressure end range flexion & extension 10 reps  Manual: Seated Rt knee flexion c mobilization c movement  IR/distraction.  Contract relax c flexion stretching  Extended time in manual for mobility gains. Hypervolt to quads   Vasopneumatic 10 mins Rt knee high compression 34 deg in elevation  TREATMENT:                                                                                              DATE: 01/11/2023: Therex: PT discussed beginning to use community fitness like [employer] or [employer] (had previous membership) 1x/wk to begin transition.   UBE UE/LE full revolution lvl 3.0 8 mins for ROM, seat 11 PT demo & verbal cues on set up of recumbent stepper including seat. Pt performed 2 min while instructed. PT recommending starting with 5 min sets with timed rests for 15-20 min work. Then weaning down rests.  Pt verbalized understanding. Leg press Double leg 2 x 15 81 lbs, single leg 37 lbs 2 x 10 with 30 sec rest breaks into flexion after each set.  Gastroc stretch on step heel depression 30 sec hold 3 reps Heel raise RUE support reaching upward with LUE 15 reps Standing TKE 10 reps.  Practice moving RLE gas to brake in car while running but in park.  Increase speed up to sudden stop as tolerated. Pt verbalized understanding.   Manual: Seated Rt knee flexion c mobilization c movement IR/distraction.  Contract relax c flexion stretching  Extended time in manual for mobility gains.    TREATMENT:  DATE: 01/08/2023 Therex: UBE UE/LE full revolution lvl 2.0 8 mins for ROM, seat 11 Leg press Double leg 2 x 15 75 lbs, single leg 31 lbs 2 x 10 with 30 sec rest breaks into flexion after each set.  Gastroc stretch on incline board 30 sec hold 3 reps Supine heel prop to tolerance with start of vaso -  Manual: Seated Rt knee flexion c mobilization c movement IR/distraction.  Contract relax c flexion stretching  Extended time in manual for mobility gains.    Vasopneumatic 10 mins Rt knee high compression 34  deg in elevation c ankle pumps throughout     HOME EXERCISE PROGRAM: Access Code: [email or web address] URL: [email or web address] Date: 11/23/2022 Prepared by: Vladimir Faster  Exercises - Ankle Alphabet in Elevation  - 2-4 x daily - 7 x weekly - 1 sets - 1 reps - Supine Single Leg Ankle Pumps  - 1 x daily - 5 x weekly - 1 sets - 10 reps - 5 seconds hold - Quad Setting and Stretching  - 2-4 x daily - 7 x weekly - 5-10 sets - 10 reps - 5 sec hold - Supine Heel Slide with Strap  - 2-3 x daily - 7 x weekly - 2-3 sets - 10 reps - 5 seconds hold - Supine Knee Extension Strengthening  - 1 x daily - 7 x weekly - 2-3 sets - 10 reps - 5 seconds hold - Supine Straight Leg Raises  - 2-3 x daily - 7 x weekly - 2-3 sets - 10 reps - 5 seconds hold - Seated Knee Flexion Extension AROM   - 2-4 x daily - 7 x weekly - 2-3 sets - 10 reps - 5 seconds hold - Seated straight leg lifts  - 2-3 x daily - 7 x weekly - 2-3 sets - 10 reps - 5 seconds hold - Seated Hamstring Stretch with Strap  - 2-4 x daily - 7 x weekly - 1 sets - 3 reps - 20-30 seconds hold - Seated Long Arc Quad  - 1 x daily - 7 x weekly - 2-3 sets - 10 reps - 5 seconds hold - Heel raises near counter  - 1 x daily - 5 x weekly - 1 sets - 10 reps - 5 seconds hold - standing calf stretch with forefoot on small step or brick  - 1 x daily - 7 x weekly - 1 sets - 3 reps - 20-30 seconds hold - Standing Knee Flexion with Counter Support  - 1 x daily - 7 x weekly - 12-3 sets - 10 reps - 5 seconds hold - Standing March with Counter Support  - 1 x daily - 7 x weekly - 2-3 sets - 10 reps - 5 seconds hold - standing knee extension  - 1 x daily - 7 x weekly - 2-3 sets - 10 reps - 5 seconds hold   ASSESSMENT: CLINICAL IMPRESSION: Pt arrives w/ stiffness rather than pain, continues to endorse slow subjective improvement but no significant changes since last session. Continues to have significant stiffness in knee joint, much of session spent with manual to  improve tissue extensibility, does endorse some improvement after STM to R quad. Following with exercises continuing to emphasize ROM in open and closed chain. Tolerates well without increase in resting pain, no adverse events. Recommend continuing along current POC in order to address relevant deficits and improve functional tolerance. Pt departs today's session in no acute distress, all voiced questions/concerns addressed appropriately from PT  perspective.    OBJECTIVE IMPAIRMENTS: Abnormal gait, decreased activity tolerance, decreased balance, decreased coordination, decreased mobility, difficulty walking, decreased ROM, decreased strength, hypomobility, increased edema, increased fascial restrictions, impaired flexibility, impaired UE functional use, and pain.   ACTIVITY LIMITATIONS: sitting, standing, squatting, stairs, transfers, bed mobility, and locomotion level  PARTICIPATION LIMITATIONS: cleaning, laundry, driving, shopping, community activity, occupation, and yard work  PERSONAL FACTORS: Age, Behavior pattern, Education, Fitness, Past/current experiences, Social background, and Time since onset of injury/illness/exacerbation are also affecting patient's functional outcome.   REHAB POTENTIAL: Good  CLINICAL DECISION MAKING: Stable/uncomplicated  EVALUATION COMPLEXITY: Low   GOALS: Goals reviewed with patient? Yes  SHORT TERM GOALS: Target date: 01/15/2023   Will be compliant with appropriate progressive HEP  Baseline: see objective data Goal status: MET 01/13/2023  2.  R knee PROM ext -5* and flexion 100* Baseline: see objective data Goal status: not met 01/13/2023  3.  Will be independent with management techniques for localized edema including wearing compression stockings and appropriate ice routine  Baseline: see objective data Goal status: MET 01/06/2023  4.  Patient ambulates with cane with knee ext in stance & flexion in swing within 75% of normal range. Baseline: see  objective data Goal status: not met 01/13/2023   LONG TERM GOALS: Target date: 02/04/2023    MMT right knee >80% of LLE with HH dynameter Baseline: see objective data Goal status: ongoing 12/23/2022  2.  R knee flexion AROM to be at least 105 degrees  Baseline: see objective data Goal status: ongoing 12/23/2022  3.  Will be able to ascend/descend steps with single rail and reciprocal pattern, no increase in pain  Baseline: see objective data Goal status: ongoing 12/23/2022  4.  Will complete 5x sit to stand test in 15 seconds or less no UEs  Baseline: see objective data Goal status: ongoing 12/23/2022  5.  Will complete TUG test in 13 seconds or less with LRAD to show improved functional balance Baseline: see objective data Goal status: ongoing 12/23/2022  6.  Will ambulate > 579ft with cane & neg ramp / curb modified independent to show improved mobility and community access  Baseline: see objective data Goal status:ongoing 12/23/2022   PLAN:  PT FREQUENCY: 2-5x/wk (daily when clinic open for 2 weeks post manipulation)  PT DURATION: 6 weeks  PLANNED INTERVENTIONS: Therapeutic exercises, Therapeutic activity, Neuromuscular re-education, Balance training, Gait training, Patient/Family education, Self Care, Joint mobilization, Stair training, Orthotic/Fit training, DME instructions, Aquatic Therapy, Dry Needling, Electrical stimulation, Cryotherapy, Moist heat, Taping, Vasopneumatic device, Ultrasound, Ionotophoresis 4mg /ml Dexamethasone, Manual therapy, and Re-evaluation  PLAN FOR NEXT SESSION:   manual therapy including soft tissue work and therapeutic exercise for range.  Vaso PRN.   Ashley Murrain PT, DPT 01/15/2023 11:55 AM

## 2023-01-15 ENCOUNTER — Encounter: Payer: Self-pay | Admitting: Physical Therapy

## 2023-01-15 ENCOUNTER — Ambulatory Visit (INDEPENDENT_AMBULATORY_CARE_PROVIDER_SITE_OTHER): Payer: Medicare Other | Admitting: Physical Therapy

## 2023-01-15 DIAGNOSIS — M6281 Muscle weakness (generalized): Secondary | ICD-10-CM

## 2023-01-15 DIAGNOSIS — R600 Localized edema: Secondary | ICD-10-CM

## 2023-01-15 DIAGNOSIS — M25561 Pain in right knee: Secondary | ICD-10-CM

## 2023-01-15 DIAGNOSIS — M25661 Stiffness of right knee, not elsewhere classified: Secondary | ICD-10-CM

## 2023-01-15 DIAGNOSIS — R262 Difficulty in walking, not elsewhere classified: Secondary | ICD-10-CM

## 2023-01-15 NOTE — Therapy (Signed)
OUTPATIENT PHYSICAL THERAPY TREATMENT   Patient Name: Melissa Maldonado MRN: [id number] DOB:22-Mar-1941, 82 y.o., female Today's Date: 01/18/2023   END OF SESSION:  PT End of Session - 01/18/23 1147     Visit Number 29    Number of Visits 40    Date for PT Re-Evaluation 02/04/23    Authorization Type MCR and BCBS [place] - [place]    Progress Note Due on Visit 33    PT Start Time 1147    PT Stop Time 1231   including 5 min vaso   PT Time Calculation (min) 44 min    Activity Tolerance Patient tolerated treatment well    Behavior During Therapy WFL for tasks assessed/performed                    Past Medical History:  Diagnosis Date   Arthritis    Chronic back pain    lumbar stenosis   Dysrhythmia    Early cataracts, bilateral    GERD (gastroesophageal reflux disease)    occasionally but no meds required   History of blood transfusion    History of colon polyps    Hyperlipidemia    takes Lipitor every other day   Hypertension    takes losartan daily   Neuropathy    Osteoarthritis of left knee 04/27/2014   Restless leg    Shortness of breath    with exertion   SOB (shortness of breath) 2013   CPET   Tear of medial meniscus of left knee 09/08/2013   Past Surgical History:  Procedure Laterality Date   APPENDECTOMY     BACK SURGERY  07/20/2008   lumb fusion   COLONOSCOPY  2018   EYE SURGERY     both cataracts   KNEE ARTHROSCOPY WITH MEDIAL MENISECTOMY Left 09/08/2013   Procedure: LEFT KNEE ARTHROSCOPY WITH PARTIAL MEDIAL MENISCECTOMY;  Surgeon: Eulas Post, MD;  Location: [facility];  Service: Orthopedics;  Laterality: Left;   LUMBAR LAMINECTOMY/DECOMPRESSION MICRODISCECTOMY  05/13/2012   Procedure: LUMBAR LAMINECTOMY/DECOMPRESSION MICRODISCECTOMY 1 LEVEL;  Surgeon: Barnett Abu, MD;  Location: [facility];  Service: Neurosurgery;  Laterality: Bilateral;  Bilateral Lumbar one -two Decompressive Laminectomy   PARTIAL KNEE ARTHROPLASTY Left  04/27/2014   Procedure: LEFT UNICOMPARTMENTAL KNEE;  Surgeon: Eulas Post, MD;  Location: [facility];  Service: Orthopedics;  Laterality: Left;   TOTAL KNEE ARTHROPLASTY Right 11/06/2022   Procedure: RIGHT TOTAL KNEE ARTHROPLASTY;  Surgeon: Kathryne Hitch, MD;  Location: [facility];  Service: Orthopedics;  Laterality: Right;   TUBAL LIGATION  1974   UPPER GI ENDOSCOPY     Patient Active Problem List   Diagnosis Date Noted   Status post total right knee replacement 11/06/2022   Arthritis of right acromioclavicular joint 08/28/2021   Impingement syndrome of right shoulder 08/19/2021   Nontraumatic incomplete tear of right rotator cuff 08/19/2021   Paresthesia 12/05/2020   Low back pain with right-sided sciatica 12/05/2020   Trochanteric bursitis, right hip 04/24/2020   Essential hypertension 01/12/2020   Sinus bradycardia 01/12/2020   Dyspnea on exertion 01/12/2020   Lumbar stenosis with neurogenic claudication 02/01/2017   Osteoarthritis of left knee 04/27/2014    PCP: Merri Brunette MD   REFERRING PROVIDER: Kathryne Hitch, MD  REFERRING DIAG: [phone] (ICD-10-CM) - Status post total right knee replacement M17.11 (ICD-10-CM) - Unilateral primary osteoarthritis, right knee  THERAPY DIAG:  Stiffness of right knee, not elsewhere classified  Acute pain  of right knee  Localized edema  Muscle weakness (generalized)  Difficulty in walking, not elsewhere classified  Rationale for Evaluation and Treatment: Rehabilitation  ONSET DATE: 11/09/2022  SUBJECTIVE:   SUBJECTIVE STATEMENT: Pt arrives w/ primary complaint of stiffness/achiness, denies overt pain. Went to [employer] over the weekend with some increased aggravation for remainder of day, otherwise no new updates.      PERTINENT HISTORY: Hospital Course: Melissa Maldonado is an 82 y.o. female who was admitted 11/06/2022 for operative treatment ofUnilateral primary osteoarthritis, right knee.  Patient has severe unremitting pain that affects sleep, daily activities, and work/hobbies. After pre-op clearance the patient was taken to the operating room on 11/06/2022 and underwent  Procedure(s): RIGHT TOTAL KNEE ARTHROPLASTY.   PMH: OA, right rotator cuff tear, LBP w/ sciatica, lumbar stenosis, back surgery lumbar fusion & discectomy,  right hip bursitis, HEN, bradycardia,    PAIN:  NPRS scale: today 0/10.  Pain location: Rt knee  Pain description: sharp  Aggravating factors: doing too much the knee  Relieving factors: unsure, maybe ice   PRECAUTIONS: None  WEIGHT BEARING RESTRICTIONS: No  FALLS:  Has patient fallen in last 6 months? No  LIVING ENVIRONMENT: Lives with: lives with their spouse Lives in: House/apartment Stairs: 4 STE B rails, split level home with 6 steps up/6 steps down U rail  Has following equipment at home: Single point cane, Walker - 2 wheeled, and toilet raiser   OCCUPATION: retired   PLOF: Independent, Independent with basic ADLs, Independent with gait, and Independent with transfers  PATIENT GOALS: be able to walk without pain  NEXT MD VISIT: manipulation 12/24/2022  OBJECTIVE: (objective measures completed at initial evaluation unless otherwise dated)   PATIENT SURVEYS:  12/29/2022: visit 17 45%  12/15/22 visit 10: 41%  11/20/2022 FOTO 55, predicted 61  COGNITION: 11/20/2022 Overall cognitive status:  possible STM impairment, family not present to confirm    SENSATION: 11/20/2022 Not tested  EDEMA:  11/20/2022  Some excessive pitting edema in surgical calf, no temperature or color changes, Homan's negative; on daily Xarelto per her report and per chart, she tells me she has been compliant with this medication   LOWER EXTREMITY ROM:  Active ROM Right eval Right 11/23/22 Right  11/30/22 Right 12/07/22 Right 12/15/22 Right 12/18/22 Right 12/24/22 S/p manipulation Right 12/28/22 Right 12/30/22 Right 12/31/22  Right 01/04/23 Right 01/06/23  Right 01/13/23  Hip flexion               Hip extension               Hip abduction               Hip adduction               Hip internal rotation               Hip external rotation               Knee flexion 64* supine  Seated P: 81* Seated 64-71* PROM and AAROM Seated A: 71* P: 82* Supine  A: 63*, AAROM in sitting 62*, passive 74* Seated AROM in knee flexion stretch 83* Supine P: 94*  Supine  With LE  94* Seated P: 95* AAROM 96* seated PROM  Seated 96* End of session PROM  Seated 96* End of session Supine PROM 70*  Knee extension 16* supine with heel prop  Supine With heel prop P: -14* Seated with foot supported on stool 9*  PROM Supine  A: Quad set -7* Supine with towel roll under heel 22*, seated PROM 12*  Supine P: -9* Standing P with mob belt -10* Standing with mob belt P: -3*  Standing with mob belt P: -3* Standing with mob belt P: -3* Standing A: -11* Supine P: -12*  Ankle dorsiflexion               Ankle plantarflexion               Ankle inversion               Ankle eversion                (Blank rows = not tested)  LOWER EXTREMITY MMT:  MMT Right eval Left eval Right 12/15/22 Left 12/15/22  Hip flexion 3+ 4+ 4+ 4+  Hip extension      Hip abduction      Hip adduction      Hip internal rotation      Hip external rotation      Knee flexion 4 4 4- 4-  Knee extension 2 4 4 5   Ankle dorsiflexion 5 5    Ankle plantarflexion      Ankle inversion      Ankle eversion       (Blank rows = not tested)    FUNCTIONAL TESTS:  12/15/2022: 5 times sit to stand: 15.5 seconds no UEs, severe offshift from R LE/compensations Timed up and go (TUG): 14.7 seconds SPC   3 minute walk test: 529ft [facility]    11/20/2022 5 times sit to stand: 43 seconds no UEs; Timed up and go (TUG): 19.5 seconds SPC;   3 minute walk test: 339ft SPC;   GAIT: 12/23/2022: pt amb with cane with antalgic pattern with knee flexion in stance and minimal increase flexion for swing.    11/20/2022 / Evaluation: Distance walked: 365ft Assistive device utilized: Single point cane Level of assistance: Modified independence Comments: slow but steady, able to make sharp turns well at each end of walking course, used SPC on wrong side, foot flat and limited heel-toe pattern, limited R knee flexion/extension in stance/swing phases                     TODAY'S TREATMENT:                                                                                               [facility] Adult PT Treatment:                                                DATE: 01/18/23 Therapeutic Exercise: Scifit bike UE/LE 6 min, fwd only, seat at 11 for ROM Seated edge of mat, push into ball for triple extension 2x12 RLE near extension, 2x12 with knee bent to ~45 deg STS raised mat 2x8 no UE support, cues for symmetry of WB and appropriate BOS Standing heel/toe raise x10 Education on appropriate exercise outside  of sessions and modifying PRN based on symptom response  Manual Therapy: Seated edge of mat; passive physiological movement flex/ext R knee, intermittent distraction with flexion to improve ROM, gentle oscillations to reduce muscle guarding; STM to distal R quad  Modalities: Vaso R knee 5 min, 38 deg medium compression, supine with BIL LE elevated; pt tolerates well without adverse event    [facility] Adult PT Treatment:                                                DATE: 01/15/23 Therapeutic Exercise: Sci fit bike UE/LE fwd, 2 min retro, seat at 11 Standing TKE green band with RW support, 2x8 cues for form and reduced hip compensations  Mini lunge weight shifts RLE fwd for inc knee flexion, 2x8 cues for breath control and ROM Seated edge of mat soft ball iso triple ext 2x10 with knee near full extension, knee at 45 deg  Manual Therapy: Seated edge of mat; passive physiological movement flex/ext R knee, intermittent distraction with flexion to improve ROM, gentle oscillations to reduce muscle guarding;  STM to R quad  Modalities: Vaso R knee 5 min, 38 deg medium compression, supine with BIL LE elevated; pt tolerates well without adverse event   DATE:  01/13/2023 Therex: Start at [employer] using recumbent stepper working 5 min rest 5 min for 3 sets.  Slowly increase work time & decrease rest time until you can work 20-30 min straight.  Set seat where your foot stays on plate when you push outward.  If the bending is not stretching at this seat position then move 1 notch closer. PT printed & pt verbalized understanding.  UBE UE/LE full revolution lvl 3.0 8 mins for ROM, seat 11 Leg press Double leg 2 x 15 81 lbs, single leg 37 lbs 2 x 10 with 30 sec rest breaks into flexion after each set.  Supine Heel slide with ball & strap with PT overpressure end range flexion & extension 10 reps  Manual: Seated Rt knee flexion c mobilization c movement IR/distraction.  Contract relax c flexion stretching  Extended time in manual for mobility gains. Hypervolt to quads   Vasopneumatic 10 mins Rt knee high compression 34 deg in elevation  TREATMENT:                                                                                              DATE: 01/11/2023: Therex: PT discussed beginning to use community fitness like [employer] or [employer] (had previous membership) 1x/wk to begin transition.   UBE UE/LE full revolution lvl 3.0 8 mins for ROM, seat 11 PT demo & verbal cues on set up of recumbent stepper including seat. Pt performed 2 min while instructed. PT recommending starting with 5 min sets with timed rests for 15-20 min work. Then weaning down rests.  Pt verbalized understanding. Leg press Double leg 2 x 15 81 lbs, single leg 37 lbs 2 x 10 with 30 sec rest  breaks into flexion after each set.  Gastroc stretch on step heel depression 30 sec hold 3 reps Heel raise RUE support reaching upward with LUE 15 reps Standing TKE 10 reps.  Practice moving RLE gas to brake in car while running but in park.   Increase speed up to sudden stop as tolerated. Pt verbalized understanding.   Manual: Seated Rt knee flexion c mobilization c movement IR/distraction.  Contract relax c flexion stretching  Extended time in manual for mobility gains.    TREATMENT:                                                                                              DATE: 01/08/2023 Therex: UBE UE/LE full revolution lvl 2.0 8 mins for ROM, seat 11 Leg press Double leg 2 x 15 75 lbs, single leg 31 lbs 2 x 10 with 30 sec rest breaks into flexion after each set.  Gastroc stretch on incline board 30 sec hold 3 reps Supine heel prop to tolerance with start of vaso -  Manual: Seated Rt knee flexion c mobilization c movement IR/distraction.  Contract relax c flexion stretching  Extended time in manual for mobility gains.    Vasopneumatic 10 mins Rt knee high compression 34 deg in elevation c ankle pumps throughout     HOME EXERCISE PROGRAM: Access Code: [email or web address] URL: [email or web address] Date: 11/23/2022 Prepared by: Vladimir Faster  Exercises - Ankle Alphabet in Elevation  - 2-4 x daily - 7 x weekly - 1 sets - 1 reps - Supine Single Leg Ankle Pumps  - 1 x daily - 5 x weekly - 1 sets - 10 reps - 5 seconds hold - Quad Setting and Stretching  - 2-4 x daily - 7 x weekly - 5-10 sets - 10 reps - 5 sec hold - Supine Heel Slide with Strap  - 2-3 x daily - 7 x weekly - 2-3 sets - 10 reps - 5 seconds hold - Supine Knee Extension Strengthening  - 1 x daily - 7 x weekly - 2-3 sets - 10 reps - 5 seconds hold - Supine Straight Leg Raises  - 2-3 x daily - 7 x weekly - 2-3 sets - 10 reps - 5 seconds hold - Seated Knee Flexion Extension AROM   - 2-4 x daily - 7 x weekly - 2-3 sets - 10 reps - 5 seconds hold - Seated straight leg lifts  - 2-3 x daily - 7 x weekly - 2-3 sets - 10 reps - 5 seconds hold - Seated Hamstring Stretch with Strap  - 2-4 x daily - 7 x weekly - 1 sets - 3 reps - 20-30 seconds hold - Seated Long  Arc Quad  - 1 x daily - 7 x weekly - 2-3 sets - 10 reps - 5 seconds hold - Heel raises near counter  - 1 x daily - 5 x weekly - 1 sets - 10 reps - 5 seconds hold - standing calf stretch with forefoot on small step or brick  - 1 x daily - 7 x weekly - 1 sets - 3 reps -  20-30 seconds hold - Standing Knee Flexion with Counter Support  - 1 x daily - 7 x weekly - 12-3 sets - 10 reps - 5 seconds hold - Standing March with Counter Support  - 1 x daily - 7 x weekly - 2-3 sets - 10 reps - 5 seconds hold - standing knee extension  - 1 x daily - 7 x weekly - 2-3 sets - 10 reps - 5 seconds hold   ASSESSMENT: CLINICAL IMPRESSION: Pt arrives w/ continued stiffness, no issues after last session reported. Today continuing to work on tissue extensibility as pt continues to demonstrate significant stiffness with both knee flexion and extension. Tolerates activity well overall, denies any increases in pain, no adverse events. Good response to vaso at end of session, reports improved stiffness/aching on departure; pt remains limited overall by stiffness and edema, both of which appear consistent with prior sessions, no notable changes observed today (formal ROM measurement deferred). Recommend continuing along current POC in order to address relevant deficits and improve functional tolerance. Pt departs today's session in no acute distress, all voiced questions/concerns addressed appropriately from PT perspective.     OBJECTIVE IMPAIRMENTS: Abnormal gait, decreased activity tolerance, decreased balance, decreased coordination, decreased mobility, difficulty walking, decreased ROM, decreased strength, hypomobility, increased edema, increased fascial restrictions, impaired flexibility, impaired UE functional use, and pain.   ACTIVITY LIMITATIONS: sitting, standing, squatting, stairs, transfers, bed mobility, and locomotion level  PARTICIPATION LIMITATIONS: cleaning, laundry, driving, shopping, community activity,  occupation, and yard work  PERSONAL FACTORS: Age, Behavior pattern, Education, Fitness, Past/current experiences, Social background, and Time since onset of injury/illness/exacerbation are also affecting patient's functional outcome.   REHAB POTENTIAL: Good  CLINICAL DECISION MAKING: Stable/uncomplicated  EVALUATION COMPLEXITY: Low   GOALS: Goals reviewed with patient? Yes  SHORT TERM GOALS: Target date: 01/15/2023   Will be compliant with appropriate progressive HEP  Baseline: see objective data Goal status: MET 01/13/2023  2.  R knee PROM ext -5* and flexion 100* Baseline: see objective data Goal status: not met 01/13/2023  3.  Will be independent with management techniques for localized edema including wearing compression stockings and appropriate ice routine  Baseline: see objective data Goal status: MET 01/06/2023  4.  Patient ambulates with cane with knee ext in stance & flexion in swing within 75% of normal range. Baseline: see objective data Goal status: not met 01/13/2023   LONG TERM GOALS: Target date: 02/04/2023    MMT right knee >80% of LLE with HH dynameter Baseline: see objective data Goal status: ongoing 12/23/2022  2.  R knee flexion AROM to be at least 105 degrees  Baseline: see objective data Goal status: ongoing 12/23/2022  3.  Will be able to ascend/descend steps with single rail and reciprocal pattern, no increase in pain  Baseline: see objective data Goal status: ongoing 12/23/2022  4.  Will complete 5x sit to stand test in 15 seconds or less no UEs  Baseline: see objective data Goal status: ongoing 12/23/2022  5.  Will complete TUG test in 13 seconds or less with LRAD to show improved functional balance Baseline: see objective data Goal status: ongoing 12/23/2022  6.  Will ambulate > 582ft with cane & neg ramp / curb modified independent to show improved mobility and community access  Baseline: see objective data Goal status:ongoing  12/23/2022   PLAN:  PT FREQUENCY: 2-5x/wk (daily when clinic open for 2 weeks post manipulation)  PT DURATION: 6 weeks  PLANNED INTERVENTIONS: Therapeutic exercises, Therapeutic activity,  Neuromuscular re-education, Balance training, Gait training, Patient/Family education, Self Care, Joint mobilization, Stair training, Orthotic/Fit training, DME instructions, Aquatic Therapy, Dry Needling, Electrical stimulation, Cryotherapy, Moist heat, Taping, Vasopneumatic device, Ultrasound, Ionotophoresis 4mg /ml Dexamethasone, Manual therapy, and Re-evaluation  PLAN FOR NEXT SESSION:   manual therapy including soft tissue work and therapeutic exercise for range.  Vaso PRN.   Ashley Murrain PT, DPT 01/18/2023 12:44 PM

## 2023-01-18 ENCOUNTER — Encounter: Payer: Self-pay | Admitting: Physical Therapy

## 2023-01-18 ENCOUNTER — Ambulatory Visit (INDEPENDENT_AMBULATORY_CARE_PROVIDER_SITE_OTHER): Payer: Medicare Other | Admitting: Physical Therapy

## 2023-01-18 DIAGNOSIS — M25661 Stiffness of right knee, not elsewhere classified: Secondary | ICD-10-CM

## 2023-01-18 DIAGNOSIS — R600 Localized edema: Secondary | ICD-10-CM

## 2023-01-18 DIAGNOSIS — M25561 Pain in right knee: Secondary | ICD-10-CM

## 2023-01-18 DIAGNOSIS — M6281 Muscle weakness (generalized): Secondary | ICD-10-CM | POA: Diagnosis not present

## 2023-01-18 DIAGNOSIS — R262 Difficulty in walking, not elsewhere classified: Secondary | ICD-10-CM

## 2023-01-19 ENCOUNTER — Encounter: Payer: Self-pay | Admitting: Physical Therapy

## 2023-01-19 ENCOUNTER — Ambulatory Visit (INDEPENDENT_AMBULATORY_CARE_PROVIDER_SITE_OTHER): Payer: Medicare Other | Admitting: Physical Therapy

## 2023-01-19 ENCOUNTER — Telehealth: Payer: Self-pay | Admitting: Orthopaedic Surgery

## 2023-01-19 ENCOUNTER — Other Ambulatory Visit: Payer: Self-pay | Admitting: Physician Assistant

## 2023-01-19 DIAGNOSIS — R600 Localized edema: Secondary | ICD-10-CM | POA: Diagnosis not present

## 2023-01-19 DIAGNOSIS — R262 Difficulty in walking, not elsewhere classified: Secondary | ICD-10-CM

## 2023-01-19 DIAGNOSIS — M25561 Pain in right knee: Secondary | ICD-10-CM

## 2023-01-19 DIAGNOSIS — M6281 Muscle weakness (generalized): Secondary | ICD-10-CM | POA: Diagnosis not present

## 2023-01-19 DIAGNOSIS — M25661 Stiffness of right knee, not elsewhere classified: Secondary | ICD-10-CM | POA: Diagnosis not present

## 2023-01-19 MED ORDER — OXYCODONE HCL 5 MG PO TABS: 5.0000 mg | ORAL_TABLET | Freq: Four times a day (QID) | ORAL | 0 refills | Status: DC | PRN

## 2023-01-19 NOTE — Telephone Encounter (Signed)
Patient is here for PT. Would like oxycodone called in for her pain.

## 2023-01-19 NOTE — Therapy (Signed)
OUTPATIENT PHYSICAL THERAPY TREATMENT   Patient Name: Melissa Maldonado MRN: [id number] DOB:[birth year], 82 y.o., female Today's Date: 01/19/2023   END OF SESSION:  PT End of Session - 01/19/23 1053     Visit Number 30    Number of Visits 40    Date for PT Re-Evaluation 02/04/23    Authorization Type MCR and BCBS State - [place]    Progress Note Due on Visit 33    PT Start Time 1053    PT Stop Time 1143    PT Time Calculation (min) 50 min    Activity Tolerance Patient tolerated treatment well    Behavior During Therapy WFL for tasks assessed/performed                    Past Medical History:  Diagnosis Date   Arthritis    Chronic back pain    lumbar stenosis   Dysrhythmia    Early cataracts, bilateral    GERD (gastroesophageal reflux disease)    occasionally but no meds required   History of blood transfusion    History of colon polyps    Hyperlipidemia    takes Lipitor every other day   Hypertension    takes losartan daily   Neuropathy    Osteoarthritis of left knee 04/27/2014   Restless leg    Shortness of breath    with exertion   SOB (shortness of breath) 2013   CPET   Tear of medial meniscus of left knee 09/08/2013   Past Surgical History:  Procedure Laterality Date   APPENDECTOMY     BACK SURGERY  07/20/2008   lumb fusion   COLONOSCOPY  2018   EYE SURGERY     both cataracts   KNEE ARTHROSCOPY WITH MEDIAL MENISECTOMY Left 09/08/2013   Procedure: LEFT KNEE ARTHROSCOPY WITH PARTIAL MEDIAL MENISCECTOMY;  Surgeon: Eulas Post, MD;  Location: [facility];  Service: Orthopedics;  Laterality: Left;   LUMBAR LAMINECTOMY/DECOMPRESSION MICRODISCECTOMY  05/13/2012   Procedure: LUMBAR LAMINECTOMY/DECOMPRESSION MICRODISCECTOMY 1 LEVEL;  Surgeon: Barnett Abu, MD;  Location: [facility];  Service: Neurosurgery;  Laterality: Bilateral;  Bilateral Lumbar one -two Decompressive Laminectomy   PARTIAL KNEE ARTHROPLASTY Left 04/27/2014   Procedure:  LEFT UNICOMPARTMENTAL KNEE;  Surgeon: Eulas Post, MD;  Location: [facility];  Service: Orthopedics;  Laterality: Left;   TOTAL KNEE ARTHROPLASTY Right 11/06/2022   Procedure: RIGHT TOTAL KNEE ARTHROPLASTY;  Surgeon: Kathryne Hitch, MD;  Location: [facility];  Service: Orthopedics;  Laterality: Right;   TUBAL LIGATION  1974   UPPER GI ENDOSCOPY     Patient Active Problem List   Diagnosis Date Noted   Status post total right knee replacement 11/06/2022   Arthritis of right acromioclavicular joint 08/28/2021   Impingement syndrome of right shoulder 08/19/2021   Nontraumatic incomplete tear of right rotator cuff 08/19/2021   Paresthesia 12/05/2020   Low back pain with right-sided sciatica 12/05/2020   Trochanteric bursitis, right hip 04/24/2020   Essential hypertension 01/12/2020   Sinus bradycardia 01/12/2020   Dyspnea on exertion 01/12/2020   Lumbar stenosis with neurogenic claudication 02/01/2017   Osteoarthritis of left knee 04/27/2014    PCP: Melissa Brunette MD   REFERRING PROVIDER: Kathryne Hitch, MD  REFERRING DIAG: [phone] (ICD-10-CM) - Status post total right knee replacement M17.11 (ICD-10-CM) - Unilateral primary osteoarthritis, right knee  THERAPY DIAG:  Stiffness of right knee, not elsewhere classified  Acute pain of right knee  Localized  edema  Muscle weakness (generalized)  Difficulty in walking, not elsewhere classified  Rationale for Evaluation and Treatment: Rehabilitation  ONSET DATE: 11/09/2022  SUBJECTIVE:   SUBJECTIVE STATEMENT: She has been to [employer] 2x since joining week ago.   PERTINENT HISTORY: Hospital Course: MODEST Maldonado is an 82 y.o. female who was admitted 11/06/2022 for operative treatment ofUnilateral primary osteoarthritis, right knee. Patient has severe unremitting pain that affects sleep, daily activities, and work/hobbies. After pre-op clearance the patient was taken to the operating room on  11/06/2022 and underwent  Procedure(s): RIGHT TOTAL KNEE ARTHROPLASTY.   PMH: OA, right rotator cuff tear, LBP w/ sciatica, lumbar stenosis, back surgery lumbar fusion & discectomy,  right hip bursitis, HEN, bradycardia,    PAIN:  NPRS scale: today 0/10 but stiffness Pain location: Rt knee  Pain description: sharp  Aggravating factors: doing too much the knee  Relieving factors: unsure, maybe ice   PRECAUTIONS: None  WEIGHT BEARING RESTRICTIONS: No  FALLS:  Has patient fallen in last 6 months? No  LIVING ENVIRONMENT: Lives with: lives with their spouse Lives in: House/apartment Stairs: 4 STE B rails, split level home with 6 steps up/6 steps down U rail  Has following equipment at home: Single point cane, Walker - 2 wheeled, and toilet raiser   OCCUPATION: retired   PLOF: Independent, Independent with basic ADLs, Independent with gait, and Independent with transfers  PATIENT GOALS: be able to walk without pain  NEXT MD VISIT: manipulation 12/24/2022  OBJECTIVE: (objective measures completed at initial evaluation unless otherwise dated)   PATIENT SURVEYS:  12/29/2022: visit 17 45%  12/15/22 visit 10: 41%  11/20/2022 FOTO 55, predicted 61  COGNITION: 11/20/2022 Overall cognitive status:  possible STM impairment, family not present to confirm    SENSATION: 11/20/2022 Not tested  EDEMA:  11/20/2022  Some excessive pitting edema in surgical calf, no temperature or color changes, Homan's negative; on daily Xarelto per her report and per chart, she tells me she has been compliant with this medication   LOWER EXTREMITY ROM:  Active ROM Right eval Right 11/23/22 Right  11/30/22 Right 12/07/22 Right 12/15/22 Right 12/18/22 Right 12/24/22 S/p manipulation Right 12/28/22 Right 12/30/22 Right 12/31/22  Right 01/04/23 Right 01/06/23 Right 01/13/23  Hip flexion               Hip extension               Hip abduction               Hip adduction               Hip internal rotation                Hip external rotation               Knee flexion 64* supine  Seated P: 81* Seated 64-71* PROM and AAROM Seated A: 71* P: 82* Supine  A: 63*, AAROM in sitting 62*, passive 74* Seated AROM in knee flexion stretch 83* Supine P: 94*  Supine  With LE  94* Seated P: 95* AAROM 96* seated PROM  Seated 96* End of session PROM  Seated 96* End of session Supine PROM 70*  Knee extension 16* supine with heel prop  Supine With heel prop P: -14* Seated with foot supported on stool 9* PROM Supine  A: Quad set -7* Supine with towel roll under heel 22*, seated PROM 12*  Supine P: -9* Standing P with mob  belt -10* Standing with mob belt P: -3*  Standing with mob belt P: -3* Standing with mob belt P: -3* Standing A: -11* Supine P: -12*  Ankle dorsiflexion               Ankle plantarflexion               Ankle inversion               Ankle eversion                (Blank rows = not tested)  LOWER EXTREMITY MMT:  MMT Right eval Left eval Right 12/15/22 Left 12/15/22  Hip flexion 3+ 4+ 4+ 4+  Hip extension      Hip abduction      Hip adduction      Hip internal rotation      Hip external rotation      Knee flexion 4 4 4- 4-  Knee extension 2 4 4 5   Ankle dorsiflexion 5 5    Ankle plantarflexion      Ankle inversion      Ankle eversion       (Blank rows = not tested)    FUNCTIONAL TESTS:  12/15/2022: 5 times sit to stand: 15.5 seconds no UEs, severe offshift from R LE/compensations Timed up and go (TUG): 14.7 seconds SPC   3 minute walk test: 561ft [facility]    11/20/2022 5 times sit to stand: 43 seconds no UEs; Timed up and go (TUG): 19.5 seconds SPC;   3 minute walk test: 339ft SPC;   GAIT: 12/23/2022: pt amb with cane with antalgic pattern with knee flexion in stance and minimal increase flexion for swing.   11/20/2022 / Evaluation: Distance walked: 353ft Assistive device utilized: Single point cane Level of assistance: Modified independence Comments: slow but steady,  able to make sharp turns well at each end of walking course, used SPC on wrong side, foot flat and limited heel-toe pattern, limited R knee flexion/extension in stance/swing phases                     TODAY'S TREATMENT:                                                                                              DATE: 01/19/2023: Therapeutic Exercise: Nustep BLEs/BUEs for range seat 7, then 6, then 5 for 2.5 min each. PT reviewed use at [employer].  Seated with RLE in 2nd chair with 5# hang 1 min, then left foot at edge for flexion stretch 1 min. 2 sets. Hamstring stretch RLE in 2nd chair with strap assist 30 sec hold 2 reps Standing gastroc stretch standing step heel depression 30 sec hold 2 reps Quad stretch supine hooklying 30 sec hold 2 reps Added above 4 exercises to HEP. See below. Verbal & HO cues and pt verbalized understanding.  Standing stepping over hurdle with active knee flexion 6" 10 reps & 9" 10 reps Heel slide with 18" ball & strap for flexion & ext stretch 5 sec hold 20 reps Bridge 5 sec hold 10 reps  Manual Therapy: Seated edge of mat; passive physiological movement flex/ext R knee, intermittent distraction with flexion to improve ROM, gentle oscillations to reduce muscle guarding; STM to distal R quad   TREATMENT:                                                                                              DATE: 01/18/23 Therapeutic Exercise: Scifit bike UE/LE 6 min, fwd only, seat at 11 for ROM Seated edge of mat, push into ball for triple extension 2x12 RLE near extension, 2x12 with knee bent to ~45 deg STS raised mat 2x8 no UE support, cues for symmetry of WB and appropriate BOS Standing heel/toe raise x10 Education on appropriate exercise outside of sessions and modifying PRN based on symptom response  Manual Therapy: Seated edge of mat; passive physiological movement flex/ext R knee, intermittent distraction with flexion to improve ROM, gentle oscillations to  reduce muscle guarding; STM to distal R quad  Modalities: Vaso R knee 5 min, 38 deg medium compression, supine with BIL LE elevated; pt tolerates well without adverse event    TREATMENT:                                                                                              DATE: 01/15/23 Therapeutic Exercise: Sci fit bike UE/LE fwd, 2 min retro, seat at 11 Standing TKE green band with RW support, 2x8 cues for form and reduced hip compensations  Mini lunge weight shifts RLE fwd for inc knee flexion, 2x8 cues for breath control and ROM Seated edge of mat soft ball iso triple ext 2x10 with knee near full extension, knee at 45 deg  Manual Therapy: Seated edge of mat; passive physiological movement flex/ext R knee, intermittent distraction with flexion to improve ROM, gentle oscillations to reduce muscle guarding; STM to R quad  Modalities: Vaso R knee 5 min, 38 deg medium compression, supine with BIL LE elevated; pt tolerates well without adverse event   TREATMENT:                                                                                              DATE:  01/13/2023 Therex: Start at [employer] using recumbent stepper working 5 min rest 5 min for 3 sets.  Slowly increase work time & decrease rest time until you can work 20-30 min straight.  Set seat where your foot stays on plate when you push outward.  If the bending is not stretching at this seat position then move 1 notch closer. PT printed & pt verbalized understanding.  UBE UE/LE full revolution lvl 3.0 8 mins for ROM, seat 11 Leg press Double leg 2 x 15 81 lbs, single leg 37 lbs 2 x 10 with 30 sec rest breaks into flexion after each set.  Supine Heel slide with ball & strap with PT overpressure end range flexion & extension 10 reps  Manual: Seated Rt knee flexion c mobilization c movement IR/distraction.  Contract relax c flexion stretching  Extended time in manual for mobility gains. Hypervolt to quads    Vasopneumatic 10 mins Rt knee high compression 34 deg in elevation   HOME EXERCISE PROGRAM: Access Code: [email or web address] URL: [email or web address] Date: 01/19/2023 Prepared by: Vladimir Faster  Exercises - Ankle Alphabet in Elevation  - 2-4 x daily - 7 x weekly - 1 sets - 1 reps - Supine Single Leg Ankle Pumps  - 1 x daily - 5 x weekly - 1 sets - 10 reps - 5 seconds hold - Quad Setting and Stretching  - 2-4 x daily - 7 x weekly - 5-10 sets - 10 reps - 5 sec hold - Supine Heel Slide with Strap  - 2-3 x daily - 7 x weekly - 2-3 sets - 10 reps - 5 seconds hold - Supine Knee Extension Strengthening  - 1 x daily - 7 x weekly - 2-3 sets - 10 reps - 5 seconds hold - Supine Straight Leg Raises  - 2-3 x daily - 7 x weekly - 2-3 sets - 10 reps - 5 seconds hold - Seated Knee Flexion Extension AROM   - 2-4 x daily - 7 x weekly - 2-3 sets - 10 reps - 5 seconds hold - Seated straight leg lifts  - 2-3 x daily - 7 x weekly - 2-3 sets - 10 reps - 5 seconds hold - Seated Hamstring Stretch with Strap  - 2-4 x daily - 7 x weekly - 1 sets - 3 reps - 20-30 seconds hold - Seated Long Arc Quad  - 1 x daily - 7 x weekly - 2-3 sets - 10 reps - 5 seconds hold - Heel raises near counter  - 1 x daily - 5 x weekly - 1 sets - 10 reps - 5 seconds hold - standing calf stretch with forefoot on small step or brick  - 1 x daily - 7 x weekly - 1 sets - 3 reps - 20-30 seconds hold - Standing Knee Flexion with Counter Support  - 1 x daily - 7 x weekly - 12-3 sets - 10 reps - 5 seconds hold - Standing March with Counter Support  - 1 x daily - 7 x weekly - 2-3 sets - 10 reps - 5 seconds hold - standing knee extension  - 1 x daily - 7 x weekly - 2-3 sets - 10 reps - 5 seconds hold - Gastroc Stretch on Step  - 2 x daily - 7 x weekly - 1 sets - 3 reps - 30 seconds hold - Seated Knee Extension Stretch with Chair  - 3 x daily - 7 x weekly - 1 sets - 2-3 reps - 1 minute hold - Seated Hamstring Stretch with Chair  - 2 x daily  - 7 x weekly - 1 sets - 3 reps - 30 seconds hold - Supine Quadriceps Stretch  with Strap on Table  - 2 x daily - 7 x weekly - 1 sets - 3 reps - 30 seconds hold   ASSESSMENT: CLINICAL IMPRESSION: PT added muscle stretches and seated hang with weight to HEP which she appears to understand.  PT continues to recommend compliance with HEP & going to [employer] using recumbent stepper which verbalized understanding.   OBJECTIVE IMPAIRMENTS: Abnormal gait, decreased activity tolerance, decreased balance, decreased coordination, decreased mobility, difficulty walking, decreased ROM, decreased strength, hypomobility, increased edema, increased fascial restrictions, impaired flexibility, impaired UE functional use, and pain.   ACTIVITY LIMITATIONS: sitting, standing, squatting, stairs, transfers, bed mobility, and locomotion level  PARTICIPATION LIMITATIONS: cleaning, laundry, driving, shopping, community activity, occupation, and yard work  PERSONAL FACTORS: Age, Behavior pattern, Education, Fitness, Past/current experiences, Social background, and Time since onset of injury/illness/exacerbation are also affecting patient's functional outcome.   REHAB POTENTIAL: Good  CLINICAL DECISION MAKING: Stable/uncomplicated  EVALUATION COMPLEXITY: Low   GOALS: Goals reviewed with patient? Yes  SHORT TERM GOALS: Target date: 01/15/2023   Will be compliant with appropriate progressive HEP  Baseline: see objective data Goal status: MET 01/13/2023  2.  R knee PROM ext -5* and flexion 100* Baseline: see objective data Goal status: not met 01/13/2023  3.  Will be independent with management techniques for localized edema including wearing compression stockings and appropriate ice routine  Baseline: see objective data Goal status: MET 01/06/2023  4.  Patient ambulates with cane with knee ext in stance & flexion in swing within 75% of normal range. Baseline: see objective data Goal status: not met  01/13/2023   LONG TERM GOALS: Target date: 02/04/2023    MMT right knee >80% of LLE with HH dynameter Baseline: see objective data Goal status: ongoing 01/19/2023  2.  R knee flexion AROM to be at least 105 degrees  Baseline: see objective data Goal status: ongoing 01/19/2023  3.  Will be able to ascend/descend steps with single rail and reciprocal pattern, no increase in pain  Baseline: see objective data Goal status: ongoing 01/19/2023  4.  Will complete 5x sit to stand test in 15 seconds or less no UEs  Baseline: see objective data Goal status: ongoing 01/19/2023  5.  Will complete TUG test in 13 seconds or less with LRAD to show improved functional balance Baseline: see objective data Goal status: ongoing 01/19/2023  6.  Will ambulate > 560ft with cane & neg ramp / curb modified independent to show improved mobility and community access  Baseline: see objective data Goal status:ongoing 01/19/2023   PLAN:  PT FREQUENCY: 2-5x/wk (daily when clinic open for 2 weeks post manipulation)  PT DURATION: 6 weeks  PLANNED INTERVENTIONS: Therapeutic exercises, Therapeutic activity, Neuromuscular re-education, Balance training, Gait training, Patient/Family education, Self Care, Joint mobilization, Stair training, Orthotic/Fit training, DME instructions, Aquatic Therapy, Dry Needling, Electrical stimulation, Cryotherapy, Moist heat, Taping, Vasopneumatic device, Ultrasound, Ionotophoresis 4mg /ml Dexamethasone, Manual therapy, and Re-evaluation  PLAN FOR NEXT SESSION:  check ROM,  manual therapy including soft tissue work and therapeutic exercise for range.  Vaso PRN.    Vladimir Faster, PT, DPT 01/19/2023, 12:39 PM

## 2023-01-20 ENCOUNTER — Encounter: Payer: Self-pay | Admitting: Rehabilitative and Restorative Service Providers"

## 2023-01-20 ENCOUNTER — Ambulatory Visit (INDEPENDENT_AMBULATORY_CARE_PROVIDER_SITE_OTHER): Payer: Medicare Other | Admitting: Rehabilitative and Restorative Service Providers"

## 2023-01-20 DIAGNOSIS — M25661 Stiffness of right knee, not elsewhere classified: Secondary | ICD-10-CM | POA: Diagnosis not present

## 2023-01-20 DIAGNOSIS — R600 Localized edema: Secondary | ICD-10-CM | POA: Diagnosis not present

## 2023-01-20 DIAGNOSIS — M25561 Pain in right knee: Secondary | ICD-10-CM | POA: Diagnosis not present

## 2023-01-20 NOTE — Therapy (Signed)
OUTPATIENT PHYSICAL THERAPY TREATMENT   Patient Name: Melissa Maldonado MRN: [id number] DOB:[birth year], 82 y.o., female Today's Date: 01/20/2023   END OF SESSION:  PT End of Session - 01/20/23 1051     Visit Number 31    Number of Visits 40    Date for PT Re-Evaluation 02/04/23    Authorization Type MCR and BCBS State - [place]    Progress Note Due on Visit 33    PT Start Time 1051    PT Stop Time 1136    PT Time Calculation (min) 45 min    Activity Tolerance Patient tolerated treatment well    Behavior During Therapy WFL for tasks assessed/performed                     Past Medical History:  Diagnosis Date   Arthritis    Chronic back pain    lumbar stenosis   Dysrhythmia    Early cataracts, bilateral    GERD (gastroesophageal reflux disease)    occasionally but no meds required   History of blood transfusion    History of colon polyps    Hyperlipidemia    takes Lipitor every other day   Hypertension    takes losartan daily   Neuropathy    Osteoarthritis of left knee 04/27/2014   Restless leg    Shortness of breath    with exertion   SOB (shortness of breath) 2013   CPET   Tear of medial meniscus of left knee 09/08/2013   Past Surgical History:  Procedure Laterality Date   APPENDECTOMY     BACK SURGERY  07/20/2008   lumb fusion   COLONOSCOPY  2018   EYE SURGERY     both cataracts   KNEE ARTHROSCOPY WITH MEDIAL MENISECTOMY Left 09/08/2013   Procedure: LEFT KNEE ARTHROSCOPY WITH PARTIAL MEDIAL MENISCECTOMY;  Surgeon: Eulas Post, MD;  Location: [facility];  Service: Orthopedics;  Laterality: Left;   LUMBAR LAMINECTOMY/DECOMPRESSION MICRODISCECTOMY  05/13/2012   Procedure: LUMBAR LAMINECTOMY/DECOMPRESSION MICRODISCECTOMY 1 LEVEL;  Surgeon: Barnett Abu, MD;  Location: [facility];  Service: Neurosurgery;  Laterality: Bilateral;  Bilateral Lumbar one -two Decompressive Laminectomy   PARTIAL KNEE ARTHROPLASTY Left 04/27/2014   Procedure:  LEFT UNICOMPARTMENTAL KNEE;  Surgeon: Eulas Post, MD;  Location: [facility];  Service: Orthopedics;  Laterality: Left;   TOTAL KNEE ARTHROPLASTY Right 11/06/2022   Procedure: RIGHT TOTAL KNEE ARTHROPLASTY;  Surgeon: Kathryne Hitch, MD;  Location: [facility];  Service: Orthopedics;  Laterality: Right;   TUBAL LIGATION  1974   UPPER GI ENDOSCOPY     Patient Active Problem List   Diagnosis Date Noted   Status post total right knee replacement 11/06/2022   Arthritis of right acromioclavicular joint 08/28/2021   Impingement syndrome of right shoulder 08/19/2021   Nontraumatic incomplete tear of right rotator cuff 08/19/2021   Paresthesia 12/05/2020   Low back pain with right-sided sciatica 12/05/2020   Trochanteric bursitis, right hip 04/24/2020   Essential hypertension 01/12/2020   Sinus bradycardia 01/12/2020   Dyspnea on exertion 01/12/2020   Lumbar stenosis with neurogenic claudication 02/01/2017   Osteoarthritis of left knee 04/27/2014    PCP: Merri Brunette MD   REFERRING PROVIDER: Kathryne Hitch, MD  REFERRING DIAG: [phone] (ICD-10-CM) - Status post total right knee replacement M17.11 (ICD-10-CM) - Unilateral primary osteoarthritis, right knee  THERAPY DIAG:  Stiffness of right knee, not elsewhere classified  Acute pain of right knee  Localized edema  Rationale for Evaluation and Treatment: Rehabilitation  ONSET DATE: 11/09/2022  SUBJECTIVE:   SUBJECTIVE STATEMENT: Pt indicated not painful but "stuck" this morning like most mornings.   PERTINENT HISTORY: Hospital Course: Melissa Maldonado is an 82 y.o. female who was admitted 11/06/2022 for operative treatment ofUnilateral primary osteoarthritis, right knee. Patient has severe unremitting pain that affects sleep, daily activities, and work/hobbies. After pre-op clearance the patient was taken to the operating room on 11/06/2022 and underwent  Procedure(s): RIGHT TOTAL KNEE ARTHROPLASTY.    PMH: OA, right rotator cuff tear, LBP w/ sciatica, lumbar stenosis, back surgery lumbar fusion & discectomy,  right hip bursitis, HEN, bradycardia,    PAIN:  NPRS scale: today 0/10  Pain location: Rt knee  Pain description: sharp  Aggravating factors: doing too much the knee  Relieving factors: unsure, maybe ice   PRECAUTIONS: None  WEIGHT BEARING RESTRICTIONS: No  FALLS:  Has patient fallen in last 6 months? No  LIVING ENVIRONMENT: Lives with: lives with their spouse Lives in: House/apartment Stairs: 4 STE B rails, split level home with 6 steps up/6 steps down U rail  Has following equipment at home: Single point cane, Walker - 2 wheeled, and toilet raiser   OCCUPATION: retired   PLOF: Independent, Independent with basic ADLs, Independent with gait, and Independent with transfers  PATIENT GOALS: be able to walk without pain  NEXT MD VISIT: manipulation 12/24/2022  OBJECTIVE: (objective measures completed at initial evaluation unless otherwise dated)   PATIENT SURVEYS:   12/29/2022: visit 17 45%  12/15/22 visit 10: 41%  11/20/2022 FOTO 55, predicted 61  COGNITION: 11/20/2022 Overall cognitive status:  possible STM impairment, family not present to confirm    SENSATION: 11/20/2022 Not tested  EDEMA:  11/20/2022  Some excessive pitting edema in surgical calf, no temperature or color changes, Homan's negative; on daily Xarelto per her report and per chart, she tells me she has been compliant with this medication   LOWER EXTREMITY ROM:  Active ROM Right eval Right 11/23/22 Right  11/30/22 Right 12/07/22 Right 12/15/22 Right 12/18/22 Right 12/24/22 S/p manipulation Right 12/28/22 Right 12/30/22 Right 12/31/22  Right 01/04/23 Right 01/06/23 Right 01/13/23  Hip flexion               Hip extension               Hip abduction               Hip adduction               Hip internal rotation               Hip external rotation               Knee flexion 64* supine  Seated P:  81* Seated 64-71* PROM and AAROM Seated A: 71* P: 82* Supine  A: 63*, AAROM in sitting 62*, passive 74* Seated AROM in knee flexion stretch 83* Supine P: 94*  Supine  With LE  94* Seated P: 95* AAROM 96* seated PROM  Seated 96* End of session PROM  Seated 96* End of session Supine PROM 70*  Knee extension 16* supine with heel prop  Supine With heel prop P: -14* Seated with foot supported on stool 9* PROM Supine  A: Quad set -7* Supine with towel roll under heel 22*, seated PROM 12*  Supine P: -9* Standing P with mob belt -10* Standing with mob belt P: -3*  Standing  with mob belt P: -3* Standing with mob belt P: -3* Standing A: -11* Supine P: -12*  Ankle dorsiflexion               Ankle plantarflexion               Ankle inversion               Ankle eversion                (Blank rows = not tested)  LOWER EXTREMITY MMT:  MMT Right eval Left eval Right 12/15/22 Left 12/15/22  Hip flexion 3+ 4+ 4+ 4+  Hip extension      Hip abduction      Hip adduction      Hip internal rotation      Hip external rotation      Knee flexion 4 4 4- 4-  Knee extension 2 4 4 5   Ankle dorsiflexion 5 5    Ankle plantarflexion      Ankle inversion      Ankle eversion       (Blank rows = not tested)    FUNCTIONAL TESTS:  12/15/2022: 5 times sit to stand: 15.5 seconds no UEs, severe offshift from R LE/compensations Timed up and go (TUG): 14.7 seconds SPC   3 minute walk test: 563ft [facility]    11/20/2022 5 times sit to stand: 43 seconds no UEs; Timed up and go (TUG): 19.5 seconds SPC;   3 minute walk test: 328ft SPC;   GAIT: 01/20/2023:  Ambulation c FWW with maintained knee flexion in stance, decreased Rt knee flexion in swing with mild hip circumduction noted.   12/23/2022: pt amb with cane with antalgic pattern with knee flexion in stance and minimal increase flexion for swing.   11/20/2022 / Evaluation: Distance walked: 3105ft Assistive device utilized: Single point cane Level of  assistance: Modified independence Comments: slow but steady, able to make sharp turns well at each end of walking course, used SPC on wrong side, foot flat and limited heel-toe pattern, limited R knee flexion/extension in stance/swing phases                     TODAY'S TREATMENT:                                                                                              DATE:01/20/2023: Therapeutic Exercise: Nustep BLEs/BUEs for range seat 7, then 6, then 5 for 3 mins each  Incline gastroc stretch 30 sec x 5 bilateral  6 inch hurdle clear step over with hand assist on bar x 15, performed bilaterally  Manual Therapy: Seated Rt knee flexion c distraction/IR mobilization c movement, contract/relax for flexion gains.  Percussive device STM to quad during.   Vasopneumatic (per Pt request) 5 mins 50 deg medium compression in elevation Rt knee   TODAY'S TREATMENT:  DATE:01/19/2023: Therapeutic Exercise: Nustep BLEs/BUEs for range seat 7, then 6, then 5 for 2.5 min each. PT reviewed use at [employer].  Seated with RLE in 2nd chair with 5# hang 1 min, then left foot at edge for flexion stretch 1 min. 2 sets. Hamstring stretch RLE in 2nd chair with strap assist 30 sec hold 2 reps Standing gastroc stretch standing step heel depression 30 sec hold 2 reps Quad stretch supine hooklying 30 sec hold 2 reps Added above 4 exercises to HEP. See below. Verbal & HO cues and pt verbalized understanding.  Standing stepping over hurdle with active knee flexion 6" 10 reps & 9" 10 reps Heel slide with 18" ball & strap for flexion & ext stretch 5 sec hold 20 reps Bridge 5 sec hold 10 reps  Manual Therapy: Seated edge of mat; passive physiological movement flex/ext R knee, intermittent distraction with flexion to improve ROM, gentle oscillations to reduce muscle guarding; STM to distal R quad   TREATMENT:                                                                                               DATE: 01/18/23 Therapeutic Exercise: Scifit bike UE/LE 6 min, fwd only, seat at 11 for ROM Seated edge of mat, push into ball for triple extension 2x12 RLE near extension, 2x12 with knee bent to ~45 deg STS raised mat 2x8 no UE support, cues for symmetry of WB and appropriate BOS Standing heel/toe raise x10 Education on appropriate exercise outside of sessions and modifying PRN based on symptom response  Manual Therapy: Seated edge of mat; passive physiological movement flex/ext R knee, intermittent distraction with flexion to improve ROM, gentle oscillations to reduce muscle guarding; STM to distal R quad  Modalities: Vaso R knee 5 min, 38 deg medium compression, supine with BIL LE elevated; pt tolerates well without adverse event    TREATMENT:                                                                                              DATE: 01/15/23 Therapeutic Exercise: Sci fit bike UE/LE fwd, 2 min retro, seat at 11 Standing TKE green band with RW support, 2x8 cues for form and reduced hip compensations  Mini lunge weight shifts RLE fwd for inc knee flexion, 2x8 cues for breath control and ROM Seated edge of mat soft ball iso triple ext 2x10 with knee near full extension, knee at 45 deg  Manual Therapy: Seated edge of mat; passive physiological movement flex/ext R knee, intermittent distraction with flexion to improve ROM, gentle oscillations to reduce muscle guarding; STM to R quad  Modalities: Vaso R knee 5 min, 38 deg medium compression, supine with BIL LE elevated; pt  tolerates well without adverse event    HOME EXERCISE PROGRAM: Access Code: [email or web address] URL: [email or web address] Date: 01/19/2023 Prepared by: Vladimir Faster  Exercises - Ankle Alphabet in Elevation  - 2-4 x daily - 7 x weekly - 1 sets - 1 reps - Supine Single Leg Ankle Pumps  - 1 x daily - 5 x weekly - 1 sets - 10  reps - 5 seconds hold - Quad Setting and Stretching  - 2-4 x daily - 7 x weekly - 5-10 sets - 10 reps - 5 sec hold - Supine Heel Slide with Strap  - 2-3 x daily - 7 x weekly - 2-3 sets - 10 reps - 5 seconds hold - Supine Knee Extension Strengthening  - 1 x daily - 7 x weekly - 2-3 sets - 10 reps - 5 seconds hold - Supine Straight Leg Raises  - 2-3 x daily - 7 x weekly - 2-3 sets - 10 reps - 5 seconds hold - Seated Knee Flexion Extension AROM   - 2-4 x daily - 7 x weekly - 2-3 sets - 10 reps - 5 seconds hold - Seated straight leg lifts  - 2-3 x daily - 7 x weekly - 2-3 sets - 10 reps - 5 seconds hold - Seated Hamstring Stretch with Strap  - 2-4 x daily - 7 x weekly - 1 sets - 3 reps - 20-30 seconds hold - Seated Long Arc Quad  - 1 x daily - 7 x weekly - 2-3 sets - 10 reps - 5 seconds hold - Heel raises near counter  - 1 x daily - 5 x weekly - 1 sets - 10 reps - 5 seconds hold - standing calf stretch with forefoot on small step or brick  - 1 x daily - 7 x weekly - 1 sets - 3 reps - 20-30 seconds hold - Standing Knee Flexion with Counter Support  - 1 x daily - 7 x weekly - 12-3 sets - 10 reps - 5 seconds hold - Standing March with Counter Support  - 1 x daily - 7 x weekly - 2-3 sets - 10 reps - 5 seconds hold - standing knee extension  - 1 x daily - 7 x weekly - 2-3 sets - 10 reps - 5 seconds hold - Gastroc Stretch on Step  - 2 x daily - 7 x weekly - 1 sets - 3 reps - 30 seconds hold - Seated Knee Extension Stretch with Chair  - 3 x daily - 7 x weekly - 1 sets - 2-3 reps - 1 minute hold - Seated Hamstring Stretch with Chair  - 2 x daily - 7 x weekly - 1 sets - 3 reps - 30 seconds hold - Supine Quadriceps Stretch with Strap on Table  - 2 x daily - 7 x weekly - 1 sets - 3 reps - 30 seconds hold   ASSESSMENT: CLINICAL IMPRESSION: Continued use of manual for progressive flexion mobility gains as well as emphasis on movement while in clinic.   Mobility deficits in flexion and extension negatively  impactful for functional activity. Medical necessity for continued skilled PT services warranted to help improve mobility/strength for functional activity, transitioning towards independent ambulation.   OBJECTIVE IMPAIRMENTS: Abnormal gait, decreased activity tolerance, decreased balance, decreased coordination, decreased mobility, difficulty walking, decreased ROM, decreased strength, hypomobility, increased edema, increased fascial restrictions, impaired flexibility, impaired UE functional use, and pain.   ACTIVITY LIMITATIONS: sitting, standing, squatting, stairs, transfers, bed mobility, and locomotion  level  PARTICIPATION LIMITATIONS: cleaning, laundry, driving, shopping, community activity, occupation, and yard work  PERSONAL FACTORS: Age, Behavior pattern, Education, Fitness, Past/current experiences, Social background, and Time since onset of injury/illness/exacerbation are also affecting patient's functional outcome.   REHAB POTENTIAL: Good  CLINICAL DECISION MAKING: Stable/uncomplicated  EVALUATION COMPLEXITY: Low   GOALS: Goals reviewed with patient? Yes  SHORT TERM GOALS: Target date: 01/15/2023   Will be compliant with appropriate progressive HEP  Baseline: see objective data Goal status: MET 01/13/2023  2.  R knee PROM ext -5* and flexion 100* Baseline: see objective data Goal status: not met 01/13/2023  3.  Will be independent with management techniques for localized edema including wearing compression stockings and appropriate ice routine  Baseline: see objective data Goal status: MET 01/06/2023  4.  Patient ambulates with cane with knee ext in stance & flexion in swing within 75% of normal range. Baseline: see objective data Goal status: not met 01/13/2023   LONG TERM GOALS: Target date: 02/04/2023    MMT right knee >80% of LLE with HH dynameter Baseline: see objective data Goal status: ongoing 01/19/2023  2.  R knee flexion AROM to be at least 105 degrees   Baseline: see objective data Goal status: ongoing 01/19/2023  3.  Will be able to ascend/descend steps with single rail and reciprocal pattern, no increase in pain  Baseline: see objective data Goal status: ongoing 01/19/2023  4.  Will complete 5x sit to stand test in 15 seconds or less no UEs  Baseline: see objective data Goal status: ongoing 01/19/2023  5.  Will complete TUG test in 13 seconds or less with LRAD to show improved functional balance Baseline: see objective data Goal status: ongoing 01/19/2023  6.  Will ambulate > 5107ft with cane & neg ramp / curb modified independent to show improved mobility and community access  Baseline: see objective data Goal status:ongoing 01/19/2023   PLAN:  PT FREQUENCY: 2-5x/wk (daily when clinic open for 2 weeks post manipulation)  PT DURATION: 6 weeks  PLANNED INTERVENTIONS: Therapeutic exercises, Therapeutic activity, Neuromuscular re-education, Balance training, Gait training, Patient/Family education, Self Care, Joint mobilization, Stair training, Orthotic/Fit training, DME instructions, Aquatic Therapy, Dry Needling, Electrical stimulation, Cryotherapy, Moist heat, Taping, Vasopneumatic device, Ultrasound, Ionotophoresis 4mg /ml Dexamethasone, Manual therapy, and Re-evaluation  PLAN FOR NEXT SESSION:  10th visit due by 33rd visit.  FOTO update, ROM check (held today due to general symptoms - focused more on intervention).    Chyrel Masson, PT, DPT, OCS, ATC 01/20/23  11:33 AM

## 2023-01-25 ENCOUNTER — Encounter (HOSPITAL_COMMUNITY): Payer: Self-pay | Admitting: Internal Medicine

## 2023-01-25 ENCOUNTER — Ambulatory Visit (HOSPITAL_COMMUNITY)
Admission: RE | Admit: 2023-01-25 | Discharge: 2023-01-25 | Disposition: A | Discharge status: Home / Self Care | Payer: Medicare Other | Source: Ambulatory Visit | Attending: Internal Medicine | Admitting: Internal Medicine

## 2023-01-25 ENCOUNTER — Ambulatory Visit (INDEPENDENT_AMBULATORY_CARE_PROVIDER_SITE_OTHER): Payer: Medicare Other | Admitting: Physical Therapy

## 2023-01-25 ENCOUNTER — Encounter: Payer: Self-pay | Admitting: Physical Therapy

## 2023-01-25 VITALS — BP 120/84 | HR 87 | Ht 65.0 in | Wt 196.6 lb

## 2023-01-25 DIAGNOSIS — M25661 Stiffness of right knee, not elsewhere classified: Secondary | ICD-10-CM | POA: Diagnosis not present

## 2023-01-25 DIAGNOSIS — M25561 Pain in right knee: Secondary | ICD-10-CM | POA: Diagnosis not present

## 2023-01-25 DIAGNOSIS — R9431 Abnormal electrocardiogram [ECG] [EKG]: Secondary | ICD-10-CM | POA: Insufficient documentation

## 2023-01-25 DIAGNOSIS — M6281 Muscle weakness (generalized): Secondary | ICD-10-CM

## 2023-01-25 DIAGNOSIS — I4819 Other persistent atrial fibrillation: Secondary | ICD-10-CM | POA: Diagnosis not present

## 2023-01-25 DIAGNOSIS — R262 Difficulty in walking, not elsewhere classified: Secondary | ICD-10-CM

## 2023-01-25 DIAGNOSIS — Z7901 Long term (current) use of anticoagulants: Secondary | ICD-10-CM | POA: Insufficient documentation

## 2023-01-25 DIAGNOSIS — D6869 Other thrombophilia: Secondary | ICD-10-CM | POA: Insufficient documentation

## 2023-01-25 DIAGNOSIS — I10 Essential (primary) hypertension: Secondary | ICD-10-CM | POA: Insufficient documentation

## 2023-01-25 DIAGNOSIS — R600 Localized edema: Secondary | ICD-10-CM | POA: Diagnosis not present

## 2023-01-25 LAB — BASIC METABOLIC PANEL
Anion gap: 10 (ref 5–15)
BUN: 16 mg/dL (ref 8–23)
CO2: 23 mmol/L (ref 22–32)
Calcium: 9.4 mg/dL (ref 8.9–10.3)
Chloride: 105 mmol/L (ref 98–111)
Creatinine, Ser: 1.37 mg/dL — ABNORMAL HIGH (ref 0.44–1.00)
GFR, Estimated: 39 mL/min — ABNORMAL LOW (ref >60)
Glucose, Bld: 95 mg/dL (ref 70–99)
Potassium: 4.2 mmol/L (ref 3.5–5.1)
Sodium: 138 mmol/L (ref 135–145)

## 2023-01-25 LAB — CBC
HCT: 40.6 % (ref 36.0–46.0)
Hemoglobin: 12.7 g/dL (ref 12.0–15.0)
MCH: 28 pg (ref 26.0–34.0)
MCHC: 31.3 g/dL (ref 30.0–36.0)
MCV: 89.4 fL (ref 80.0–100.0)
Platelets: 342 10*3/uL (ref 150–400)
RBC: 4.54 MIL/uL (ref 3.87–5.11)
RDW: 13.1 % (ref 11.5–15.5)
WBC: 4.8 10*3/uL (ref 4.0–10.5)
nRBC: 0 % (ref 0.0–0.2)

## 2023-01-25 NOTE — Therapy (Signed)
OUTPATIENT PHYSICAL THERAPY TREATMENT   Patient Name: Melissa Maldonado MRN: [id number] DOB:1941/04/30, 82 y.o., female Today's Date: 01/25/2023   END OF SESSION:  PT End of Session - 01/25/23 1141     Visit Number 32    Number of Visits 40    Date for PT Re-Evaluation 02/04/23    Authorization Type MCR and BCBS State - [place]    Progress Note Due on Visit 33    PT Start Time 1141    PT Stop Time 1240    PT Time Calculation (min) 59 min    Activity Tolerance Patient tolerated treatment well    Behavior During Therapy WFL for tasks assessed/performed                     Past Medical History:  Diagnosis Date   Arthritis    Chronic back pain    lumbar stenosis   Dysrhythmia    Early cataracts, bilateral    GERD (gastroesophageal reflux disease)    occasionally but no meds required   History of blood transfusion    History of colon polyps    Hyperlipidemia    takes Lipitor every other day   Hypertension    takes losartan daily   Neuropathy    Osteoarthritis of left knee 04/27/2014   Restless leg    Shortness of breath    with exertion   SOB (shortness of breath) 2013   CPET   Tear of medial meniscus of left knee 09/08/2013   Past Surgical History:  Procedure Laterality Date   APPENDECTOMY     BACK SURGERY  07/20/2008   lumb fusion   COLONOSCOPY  2018   EYE SURGERY     both cataracts   KNEE ARTHROSCOPY WITH MEDIAL MENISECTOMY Left 09/08/2013   Procedure: LEFT KNEE ARTHROSCOPY WITH PARTIAL MEDIAL MENISCECTOMY;  Surgeon: Eulas Post, MD;  Location: [facility];  Service: Orthopedics;  Laterality: Left;   LUMBAR LAMINECTOMY/DECOMPRESSION MICRODISCECTOMY  05/13/2012   Procedure: LUMBAR LAMINECTOMY/DECOMPRESSION MICRODISCECTOMY 1 LEVEL;  Surgeon: Barnett Abu, MD;  Location: [facility];  Service: Neurosurgery;  Laterality: Bilateral;  Bilateral Lumbar one -two Decompressive Laminectomy   PARTIAL KNEE ARTHROPLASTY Left 04/27/2014   Procedure:  LEFT UNICOMPARTMENTAL KNEE;  Surgeon: Eulas Post, MD;  Location: [facility];  Service: Orthopedics;  Laterality: Left;   TOTAL KNEE ARTHROPLASTY Right 11/06/2022   Procedure: RIGHT TOTAL KNEE ARTHROPLASTY;  Surgeon: Melissa Hitch, MD;  Location: [facility];  Service: Orthopedics;  Laterality: Right;   TUBAL LIGATION  1974   UPPER GI ENDOSCOPY     Patient Active Problem List   Diagnosis Date Noted   Persistent atrial fibrillation (HCC) 01/25/2023   Hypercoagulable state due to persistent atrial fibrillation (HCC) 01/25/2023   Status post total right knee replacement 11/06/2022   Arthritis of right acromioclavicular joint 08/28/2021   Impingement syndrome of right shoulder 08/19/2021   Nontraumatic incomplete tear of right rotator cuff 08/19/2021   Paresthesia 12/05/2020   Low back pain with right-sided sciatica 12/05/2020   Trochanteric bursitis, right hip 04/24/2020   Essential hypertension 01/12/2020   Sinus bradycardia 01/12/2020   Dyspnea on exertion 01/12/2020   Lumbar stenosis with neurogenic claudication 02/01/2017   Osteoarthritis of left knee 04/27/2014    PCP: Melissa Brunette MD   REFERRING PROVIDER: Kathryne Hitch, MD  REFERRING DIAG: [phone] (ICD-10-CM) - Status post total right knee replacement M17.11 (ICD-10-CM) - Unilateral primary osteoarthritis, right knee  THERAPY DIAG:  Stiffness of right knee, not elsewhere classified  Acute pain of right knee  Localized edema  Muscle weakness (generalized)  Difficulty in walking, not elsewhere classified  Rationale for Evaluation and Treatment: Rehabilitation  ONSET DATE: 11/09/2022  SUBJECTIVE:   SUBJECTIVE STATEMENT: She saw MD and she is constantly in A-Fib. She will permanently on medication.  She went to [employer] Friday and Saturday using stepper moving seat up from 14 to 12.    PERTINENT HISTORY: Hospital Course: Melissa Maldonado is an 82 y.o. female who was admitted  11/06/2022 for operative treatment ofUnilateral primary osteoarthritis, right knee. Patient has severe unremitting pain that affects sleep, daily activities, and work/hobbies. After pre-op clearance the patient was taken to the operating room on 11/06/2022 and underwent  Procedure(s): RIGHT TOTAL KNEE ARTHROPLASTY.   PMH: OA, right rotator cuff tear, LBP w/ sciatica, lumbar stenosis, back surgery lumbar fusion & discectomy,  right hip bursitis, HEN, bradycardia,    PAIN:  NPRS scale: today 0/10 just stiff Pain location: Rt knee  Pain description: sharp  Aggravating factors: doing too much the knee  Relieving factors: unsure, maybe ice   PRECAUTIONS: None  WEIGHT BEARING RESTRICTIONS: No  FALLS:  Has patient fallen in last 6 months? No  LIVING ENVIRONMENT: Lives with: lives with their spouse Lives in: House/apartment Stairs: 4 STE B rails, split level home with 6 steps up/6 steps down U rail  Has following equipment at home: Single point cane, Walker - 2 wheeled, and toilet raiser   OCCUPATION: retired   PLOF: Independent, Independent with basic ADLs, Independent with gait, and Independent with transfers  PATIENT GOALS: be able to walk without pain  NEXT MD VISIT: manipulation 12/24/2022  OBJECTIVE: (objective measures completed at initial evaluation unless otherwise dated)   PATIENT SURVEYS:   12/29/2022: visit 17 45%  12/15/22 visit 10: 41%  11/20/2022 FOTO 55, predicted 61  COGNITION: 11/20/2022 Overall cognitive status:  possible STM impairment, family not present to confirm    SENSATION: 11/20/2022 Not tested  EDEMA:  11/20/2022  Some excessive pitting edema in surgical calf, no temperature or color changes, Homan's negative; on daily Xarelto per her report and per chart, she tells me she has been compliant with this medication   LOWER EXTREMITY ROM:  Active ROM Right eval Right 11/23/22 Right  11/30/22 Right 12/07/22 Right 12/15/22 Right 12/18/22 Right 12/24/22 S/p  manipulation Right 12/28/22 Right 12/30/22 Right 12/31/22  Right 01/04/23 Right 01/06/23 Right 01/13/23  Hip flexion               Hip extension               Hip abduction               Hip adduction               Hip internal rotation               Hip external rotation               Knee flexion 64* supine  Seated P: 81* Seated 64-71* PROM and AAROM Seated A: 71* P: 82* Supine  A: 63*, AAROM in sitting 62*, passive 74* Seated AROM in knee flexion stretch 83* Supine P: 94*  Supine  With LE  94* Seated P: 95* AAROM 96* seated PROM  Seated 96* End of session PROM  Seated 96* End of session Supine PROM 70*  Knee extension 16* supine with  heel prop  Supine With heel prop P: -14* Seated with foot supported on stool 9* PROM Supine  A: Quad set -7* Supine with towel roll under heel 22*, seated PROM 12*  Supine P: -9* Standing P with mob belt -10* Standing with mob belt P: -3*  Standing with mob belt P: -3* Standing with mob belt P: -3* Standing A: -11* Supine P: -12*  Ankle dorsiflexion               Ankle plantarflexion               Ankle inversion               Ankle eversion                (Blank rows = not tested)  LOWER EXTREMITY MMT:  MMT Right eval Left eval Right 12/15/22 Left 12/15/22  Hip flexion 3+ 4+ 4+ 4+  Hip extension      Hip abduction      Hip adduction      Hip internal rotation      Hip external rotation      Knee flexion 4 4 4- 4-  Knee extension 2 4 4 5   Ankle dorsiflexion 5 5    Ankle plantarflexion      Ankle inversion      Ankle eversion       (Blank rows = not tested)    FUNCTIONAL TESTS:  12/15/2022: 5 times sit to stand: 15.5 seconds no UEs, severe offshift from R LE/compensations Timed up and go (TUG): 14.7 seconds SPC   3 minute walk test: 563ft [facility]    11/20/2022 5 times sit to stand: 43 seconds no UEs; Timed up and go (TUG): 19.5 seconds SPC;   3 minute walk test: 345ft SPC;   GAIT: 01/20/2023:  Ambulation c FWW with  maintained knee flexion in stance, decreased Rt knee flexion in swing with mild hip circumduction noted.   12/23/2022: pt amb with cane with antalgic pattern with knee flexion in stance and minimal increase flexion for swing.   11/20/2022 / Evaluation: Distance walked: 322ft Assistive device utilized: Single point cane Level of assistance: Modified independence Comments: slow but steady, able to make sharp turns well at each end of walking course, used SPC on wrong side, foot flat and limited heel-toe pattern, limited R knee flexion/extension in stance/swing phases                     TODAY'S TREATMENT:                                                                                              DATE:01/25/2023: Therapeutic Exercise: Nustep BLEs/BUEs for range seat 7, then 6, then 5 for 3 mins each; first minute of each slow stretch with 5 sec hold flex & ext, then 2 min walking pace with full range of machine setting for flexion & extension. Leg press BLEs 100# 15 reps; RLE only 50# 10 reps 2 sets. PT demo too much and too little weight. PT recommended adding to [employer].  Pt verbalized understanding.  Seated with LLE in 2nd chair ext 5# hang 1 min, then flexion stretch with left foot on edge of 2nd chair scooting buttocks closer 1 min; 2 sets. Seated LAQ & active knee flexion with opposing motion contralateral LE 2 min.  Single knee to chest stretch 15 sec hold 2 reps ea  Pt amb in clinic bw areas without device.   Manual Therapy: Seated Rt knee flexion c distraction/IR mobilization c movement, contract/relax for flexion gains. PT demo & verbal cues on instrument assisted soft tissue with bar sliding on anterior knee especially over scar. Pt verbalized & return demo understanding.    Vasopneumatic (per Pt request) 10 mins 34 deg medium compression in elevation Rt knee    TREATMENT:                                                                                               DATE:01/20/2023: Therapeutic Exercise: Nustep BLEs/BUEs for range seat 7, then 6, then 5 for 3 mins each  Incline gastroc stretch 30 sec x 5 bilateral  6 inch hurdle clear step over with hand assist on bar x 15, performed bilaterally  Manual Therapy: Seated Rt knee flexion c distraction/IR mobilization c movement, contract/relax for flexion gains.  Percussive device STM to quad during.   Vasopneumatic (per Pt request) 5 mins 50 deg medium compression in elevation Rt knee   TREATMENT:                                                                                              DATE:01/19/2023: Therapeutic Exercise: Nustep BLEs/BUEs for range seat 7, then 6, then 5 for 2.5 min each. PT reviewed use at [employer].  Seated with RLE in 2nd chair with 5# hang 1 min, then left foot at edge for flexion stretch 1 min. 2 sets. Hamstring stretch RLE in 2nd chair with strap assist 30 sec hold 2 reps Standing gastroc stretch standing step heel depression 30 sec hold 2 reps Quad stretch supine hooklying 30 sec hold 2 reps Added above 4 exercises to HEP. See below. Verbal & HO cues and pt verbalized understanding.  Standing stepping over hurdle with active knee flexion 6" 10 reps & 9" 10 reps Heel slide with 18" ball & strap for flexion & ext stretch 5 sec hold 20 reps Bridge 5 sec hold 10 reps  Manual Therapy: Seated edge of mat; passive physiological movement flex/ext R knee, intermittent distraction with flexion to improve ROM, gentle oscillations to reduce muscle guarding; STM to distal R quad   HOME EXERCISE PROGRAM: Access Code: [email or web address] URL: [email or web address] Date: 01/19/2023 Prepared by: Vladimir Faster  Exercises - Ankle Alphabet in Elevation  - 2-4 x daily - 7 x  weekly - 1 sets - 1 reps - Supine Single Leg Ankle Pumps  - 1 x daily - 5 x weekly - 1 sets - 10 reps - 5 seconds hold - Quad Setting and Stretching  - 2-4 x daily - 7 x weekly - 5-10 sets - 10 reps - 5 sec  hold - Supine Heel Slide with Strap  - 2-3 x daily - 7 x weekly - 2-3 sets - 10 reps - 5 seconds hold - Supine Knee Extension Strengthening  - 1 x daily - 7 x weekly - 2-3 sets - 10 reps - 5 seconds hold - Supine Straight Leg Raises  - 2-3 x daily - 7 x weekly - 2-3 sets - 10 reps - 5 seconds hold - Seated Knee Flexion Extension AROM   - 2-4 x daily - 7 x weekly - 2-3 sets - 10 reps - 5 seconds hold - Seated straight leg lifts  - 2-3 x daily - 7 x weekly - 2-3 sets - 10 reps - 5 seconds hold - Seated Hamstring Stretch with Strap  - 2-4 x daily - 7 x weekly - 1 sets - 3 reps - 20-30 seconds hold - Seated Long Arc Quad  - 1 x daily - 7 x weekly - 2-3 sets - 10 reps - 5 seconds hold - Heel raises near counter  - 1 x daily - 5 x weekly - 1 sets - 10 reps - 5 seconds hold - standing calf stretch with forefoot on small step or brick  - 1 x daily - 7 x weekly - 1 sets - 3 reps - 20-30 seconds hold - Standing Knee Flexion with Counter Support  - 1 x daily - 7 x weekly - 12-3 sets - 10 reps - 5 seconds hold - Standing March with Counter Support  - 1 x daily - 7 x weekly - 2-3 sets - 10 reps - 5 seconds hold - standing knee extension  - 1 x daily - 7 x weekly - 2-3 sets - 10 reps - 5 seconds hold - Gastroc Stretch on Step  - 2 x daily - 7 x weekly - 1 sets - 3 reps - 30 seconds hold - Seated Knee Extension Stretch with Chair  - 3 x daily - 7 x weekly - 1 sets - 2-3 reps - 1 minute hold - Seated Hamstring Stretch with Chair  - 2 x daily - 7 x weekly - 1 sets - 3 reps - 30 seconds hold - Supine Quadriceps Stretch with Strap on Table  - 2 x daily - 7 x weekly - 1 sets - 3 reps - 30 seconds hold   ASSESSMENT: CLINICAL IMPRESSION: Patient continues to struggle with knee stiffness. She is able to loosen it some with exercise but appears to stiffen between session.    OBJECTIVE IMPAIRMENTS: Abnormal gait, decreased activity tolerance, decreased balance, decreased coordination, decreased mobility, difficulty  walking, decreased ROM, decreased strength, hypomobility, increased edema, increased fascial restrictions, impaired flexibility, impaired UE functional use, and pain.   ACTIVITY LIMITATIONS: sitting, standing, squatting, stairs, transfers, bed mobility, and locomotion level  PARTICIPATION LIMITATIONS: cleaning, laundry, driving, shopping, community activity, occupation, and yard work  PERSONAL FACTORS: Age, Behavior pattern, Education, Fitness, Past/current experiences, Social background, and Time since onset of injury/illness/exacerbation are also affecting patient's functional outcome.   REHAB POTENTIAL: Good  CLINICAL DECISION MAKING: Stable/uncomplicated  EVALUATION COMPLEXITY: Low   GOALS: Goals reviewed with patient? Yes  SHORT TERM GOALS:  Target date: 01/15/2023   Will be compliant with appropriate progressive HEP  Baseline: see objective data Goal status: MET 01/13/2023  2.  R knee PROM ext -5* and flexion 100* Baseline: see objective data Goal status: not met 01/13/2023  3.  Will be independent with management techniques for localized edema including wearing compression stockings and appropriate ice routine  Baseline: see objective data Goal status: MET 01/06/2023  4.  Patient ambulates with cane with knee ext in stance & flexion in swing within 75% of normal range. Baseline: see objective data Goal status: not met 01/13/2023   LONG TERM GOALS: Target date: 02/04/2023    MMT right knee >80% of LLE with HH dynameter Baseline: see objective data Goal status: ongoing 01/19/2023  2.  R knee flexion AROM to be at least 105 degrees  Baseline: see objective data Goal status: ongoing 01/19/2023  3.  Will be able to ascend/descend steps with single rail and reciprocal pattern, no increase in pain  Baseline: see objective data Goal status: ongoing 01/19/2023  4.  Will complete 5x sit to stand test in 15 seconds or less no UEs  Baseline: see objective data Goal status: ongoing  01/19/2023  5.  Will complete TUG test in 13 seconds or less with LRAD to show improved functional balance Baseline: see objective data Goal status: ongoing 01/19/2023  6.  Will ambulate > 544ft with cane & neg ramp / curb modified independent to show improved mobility and community access  Baseline: see objective data Goal status:ongoing 01/19/2023   PLAN:  PT FREQUENCY: 2-5x/wk (daily when clinic open for 2 weeks post manipulation)  PT DURATION: 6 weeks  PLANNED INTERVENTIONS: Therapeutic exercises, Therapeutic activity, Neuromuscular re-education, Balance training, Gait training, Patient/Family education, Self Care, Joint mobilization, Stair training, Orthotic/Fit training, DME instructions, Aquatic Therapy, Dry Needling, Electrical stimulation, Cryotherapy, Moist heat, Taping, Vasopneumatic device, Ultrasound, Ionotophoresis 4mg /ml Dexamethasone, Manual therapy, and Re-evaluation  PLAN FOR NEXT SESSION:   10th visit next visit which is 33rd visit.  FOTO update, ROM check, continue to educate how to work on range & strength, plan to discharge next week at end of POC.     Vladimir Faster, PT, DPT 01/25/2023, 12:39 PM

## 2023-01-25 NOTE — H&P (View-Only) (Signed)
  Primary Care Physician: Pharr, Walter, MD Primary Cardiologist: Jonathan Berry, MD Electrophysiologist: None     Referring Physician: Caitlin Walker, NP     Melissa Maldonado is a 81 y.o. female with a history of HTN, obesity, and persistent atrial fibrillation who presents for consultation in the [facility].  The patient was initially diagnosed with atrial fibrillation at time of right knee surgery. She does not appear to have cardiac awareness of Afib. Patient is on Xarelto 15 mg for a CHADS2VASC score of 4.  On evaluation today, she is currently in rate controlled Afib. She has no cardiac awareness of Afib. Husband notes she snores and makes abnormal sounds while asleep sometimes. She has not missed any doses of Xarelto.  Today, she denies symptoms of palpitations, chest pain, shortness of breath, orthopnea, PND, lower extremity edema, dizziness, presyncope, syncope, snoring, daytime somnolence, bleeding, or neurologic sequela. The patient is tolerating medications without difficulties and is otherwise without complaint today.    Atrial Fibrillation Risk Factors:  she does have symptoms or diagnosis of sleep apnea.   she has a BMI of Body mass index is 32.72 kg/m.. Filed Weights   01/25/23 0923  Weight: 89.2 kg    Current Outpatient Medications  Medication Sig Dispense Refill   acetaminophen (TYLENOL 8 HOUR) 650 MG CR tablet Take 1,300 mg by mouth See admin instructions. Take 1300 mg daily, may take a second 1300 mg dose as needed for pain     amLODipine (NORVASC) 5 MG tablet Take 5 mg by mouth daily.     dorzolamide (TRUSOPT) 2 % ophthalmic solution Place 1 drop into the left eye 2 (two) times daily.     oxyCODONE (OXY IR/ROXICODONE) 5 MG immediate release tablet Take 1-2 tablets (5-10 mg total) by mouth every 6 (six) hours as needed for moderate pain (pain score 4-6). 30 tablet 0   Rivaroxaban (XARELTO) 15 MG TABS tablet Take 1 tablet (15 mg  total) by mouth daily with supper. Take 1 st dose 11/07/22 at 1700 hrs. 90 tablet 3   rOPINIRole (REQUIP) 0.5 MG tablet Take 0.5 mg by mouth at bedtime.     tiZANidine (ZANAFLEX) 4 MG tablet Take 1 tablet (4 mg total) by mouth every 8 (eight) hours as needed for muscle spasms. 60 tablet 0   Cholecalciferol (VITAMIN D) 50 MCG (2000 UT) tablet Take 2,000 Units by mouth daily. (Patient not taking: Reported on 01/25/2023)     WEGOVY 2.4 MG/0.75ML SOAJ Inject 2.4 mg into the skin every Sunday. (Patient not taking: Reported on 01/25/2023)     No current facility-administered medications for this encounter.    Atrial Fibrillation Management history:  Previous antiarrhythmic drugs: None Previous cardioversions: None Previous ablations: None Anticoagulation history: Xarelto 15 mg   ROS- All systems are reviewed and negative except as per the HPI above.  Physical Exam: BP 120/84   Pulse 87   Ht 5' 5" (1.651 m)   Wt 89.2 kg   BMI 32.72 kg/m   GEN: Well nourished, well developed in no acute distress NECK: No JVD; No carotid bruits CARDIAC: Irregularly irregular rate and rhythm, no murmurs, rubs, gallops RESPIRATORY:  Clear to auscultation without rales, wheezing or rhonchi  ABDOMEN: Soft, non-tender, non-distended EXTREMITIES:  No edema; No deformity   EKG today demonstrates  Vent. rate 87 BPM PR interval * ms QRS duration 80 ms QT/QTcB 348/418 ms P-R-T axes * 15 64 Atrial fibrillation Low voltage QRS Cannot rule   out Anterior infarct , age undetermined Abnormal ECG When compared with ECG of 08-Sep-2022 13:07, PREVIOUS ECG IS PRESENT  Echo 09/28/22 demonstrated   1. Left ventricular ejection fraction, by estimation, is 60 to 65%. The  left ventricle has normal function. The left ventricle has no regional  wall motion abnormalities. Left ventricular diastolic function could not  be evaluated.   2. Right ventricular systolic function is normal. The right ventricular  size is normal.  There is normal pulmonary artery systolic pressure.   3. Right atrial size was mildly dilated.   4. The mitral valve is normal in structure. Trivial mitral valve  regurgitation. No evidence of mitral stenosis.   5. The aortic valve is tricuspid. Aortic valve regurgitation is not  visualized. No aortic stenosis is present.   6. The inferior vena cava is normal in size with greater than 50%  respiratory variability, suggesting right atrial pressure of 3 mmHg.   Cardiac monitor 2/23-09/19/22: Atrial Fibrillation occurred continuously (100% burden), ranging from 44-185 bpm (avg of 86 bpm). 1 Pause occurred lasting 4.3 secs (14 bpm). Isolated VEs were rare (<1.0%), VE Couplets were rare (<1.0%), and no VE Triplets were present.   1. AFIB 100% 2. Tachy/Brady (44-185) 3. One pause 4.3 seconds   ASSESSMENT & PLAN CHA2DS2-VASc Score = 4  The patient's score is based upon: CHF History: 0 HTN History: 1 Diabetes History: 0 Stroke History: 0 Vascular Disease History: 0 Age Score: 2 Gender Score: 1       ASSESSMENT AND PLAN: Persistent Atrial Fibrillation (ICD10:  I48.19) The patient's CHA2DS2-VASc score is 4, indicating a 4.8% annual risk of stroke.    Education provided about Afib. Discussion about medication treatments and ablation in detail. After discussion, we will proceed with scheduling DCCV as an initial step in treatment. Will refer for sleep study. F/u 1-2 weeks after DCCV.  Informed Consent   Shared Decision Making/Informed Consent The risks (stroke, cardiac arrhythmias rarely resulting in the need for a temporary or permanent pacemaker, skin irritation or burns and complications associated with conscious sedation including aspiration, arrhythmia, respiratory failure and death), benefits (restoration of normal sinus rhythm) and alternatives of a direct current cardioversion were explained in detail to Ms. Duffett and she agrees to proceed.     Secondary Hypercoagulable State  (ICD10:  D68.69) The patient is at significant risk for stroke/thromboembolism based upon her CHA2DS2-VASc Score of 4.  Continue Rivaroxaban (Xarelto).  No missed doses.    Follow up 1-2 weeks after DCCV.    Carlas Vandyne, PA-C  [facility] [address] [place],  [address] [phone]  

## 2023-01-25 NOTE — Progress Notes (Signed)
Primary Care Physician: Merri Brunette, MD Primary Cardiologist: Nanetta Batty, MD Electrophysiologist: None     Referring Physician: Gillian Shields, NP     Melissa Maldonado is a 82 y.o. female with a history of HTN, obesity, and persistent atrial fibrillation who presents for consultation in the [facility].  The patient was initially diagnosed with atrial fibrillation at time of right knee surgery. She does not appear to have cardiac awareness of Afib. Patient is on Xarelto 15 mg for a CHADS2VASC score of 4.  On evaluation today, she is currently in rate controlled Afib. She has no cardiac awareness of Afib. Husband notes she snores and makes abnormal sounds while asleep sometimes. She has not missed any doses of Xarelto.  Today, she denies symptoms of palpitations, chest pain, shortness of breath, orthopnea, PND, lower extremity edema, dizziness, presyncope, syncope, snoring, daytime somnolence, bleeding, or neurologic sequela. The patient is tolerating medications without difficulties and is otherwise without complaint today.    Atrial Fibrillation Risk Factors:  she does have symptoms or diagnosis of sleep apnea.   she has a BMI of Body mass index is 32.72 kg/m.Marland Kitchen Filed Weights   01/25/23 0923  Weight: 89.2 kg    Current Outpatient Medications  Medication Sig Dispense Refill   acetaminophen (TYLENOL 8 HOUR) 650 MG CR tablet Take 1,300 mg by mouth See admin instructions. Take 1300 mg daily, may take a second 1300 mg dose as needed for pain     amLODipine (NORVASC) 5 MG tablet Take 5 mg by mouth daily.     dorzolamide (TRUSOPT) 2 % ophthalmic solution Place 1 drop into the left eye 2 (two) times daily.     oxyCODONE (OXY IR/ROXICODONE) 5 MG immediate release tablet Take 1-2 tablets (5-10 mg total) by mouth every 6 (six) hours as needed for moderate pain (pain score 4-6). 30 tablet 0   Rivaroxaban (XARELTO) 15 MG TABS tablet Take 1 tablet (15 mg  total) by mouth daily with supper. Take 1 st dose 11/07/22 at 1700 hrs. 90 tablet 3   rOPINIRole (REQUIP) 0.5 MG tablet Take 0.5 mg by mouth at bedtime.     tiZANidine (ZANAFLEX) 4 MG tablet Take 1 tablet (4 mg total) by mouth every 8 (eight) hours as needed for muscle spasms. 60 tablet 0   Cholecalciferol (VITAMIN D) 50 MCG (2000 UT) tablet Take 2,000 Units by mouth daily. (Patient not taking: Reported on 01/25/2023)     WEGOVY 2.4 MG/0.75ML SOAJ Inject 2.4 mg into the skin every Sunday. (Patient not taking: Reported on 01/25/2023)     No current facility-administered medications for this encounter.    Atrial Fibrillation Management history:  Previous antiarrhythmic drugs: None Previous cardioversions: None Previous ablations: None Anticoagulation history: Xarelto 15 mg   ROS- All systems are reviewed and negative except as per the HPI above.  Physical Exam: BP 120/84   Pulse 87   Ht 5\' 5"  (1.651 m)   Wt 89.2 kg   BMI 32.72 kg/m   GEN: Well nourished, well developed in no acute distress NECK: No JVD; No carotid bruits CARDIAC: Irregularly irregular rate and rhythm, no murmurs, rubs, gallops RESPIRATORY:  Clear to auscultation without rales, wheezing or rhonchi  ABDOMEN: Soft, non-tender, non-distended EXTREMITIES:  No edema; No deformity   EKG today demonstrates  Vent. rate 87 BPM PR interval * ms QRS duration 80 ms QT/QTcB 348/418 ms P-R-T axes * 15 64 Atrial fibrillation Low voltage QRS Cannot rule  out Anterior infarct , age undetermined Abnormal ECG When compared with ECG of 08-Sep-2022 13:07, PREVIOUS ECG IS PRESENT  Echo 09/28/22 demonstrated   1. Left ventricular ejection fraction, by estimation, is 60 to 65%. The  left ventricle has normal function. The left ventricle has no regional  wall motion abnormalities. Left ventricular diastolic function could not  be evaluated.   2. Right ventricular systolic function is normal. The right ventricular  size is normal.  There is normal pulmonary artery systolic pressure.   3. Right atrial size was mildly dilated.   4. The mitral valve is normal in structure. Trivial mitral valve  regurgitation. No evidence of mitral stenosis.   5. The aortic valve is tricuspid. Aortic valve regurgitation is not  visualized. No aortic stenosis is present.   6. The inferior vena cava is normal in size with greater than 50%  respiratory variability, suggesting right atrial pressure of 3 mmHg.   Cardiac monitor 2/23-09/19/22: Atrial Fibrillation occurred continuously (100% burden), ranging from 44-185 bpm (avg of 86 bpm). 1 Pause occurred lasting 4.3 secs (14 bpm). Isolated VEs were rare (<1.0%), VE Couplets were rare (<1.0%), and no VE Triplets were present.   1. AFIB 100% 2. Tachy/Brady (44-185) 3. One pause 4.3 seconds   ASSESSMENT & PLAN CHA2DS2-VASc Score = 4  The patient's score is based upon: CHF History: 0 HTN History: 1 Diabetes History: 0 Stroke History: 0 Vascular Disease History: 0 Age Score: 2 Gender Score: 1       ASSESSMENT AND PLAN: Persistent Atrial Fibrillation (ICD10:  I48.19) The patient's CHA2DS2-VASc score is 4, indicating a 4.8% annual risk of stroke.    Education provided about Afib. Discussion about medication treatments and ablation in detail. After discussion, we will proceed with scheduling DCCV as an initial step in treatment. Will refer for sleep study. F/u 1-2 weeks after DCCV.  Informed Consent   Shared Decision Making/Informed Consent The risks (stroke, cardiac arrhythmias rarely resulting in the need for a temporary or permanent pacemaker, skin irritation or burns and complications associated with conscious sedation including aspiration, arrhythmia, respiratory failure and death), benefits (restoration of normal sinus rhythm) and alternatives of a direct current cardioversion were explained in detail to [name] and she agrees to proceed.     Secondary Hypercoagulable State  (ICD10:  D68.69) The patient is at significant risk for stroke/thromboembolism based upon her CHA2DS2-VASc Score of 4.  Continue Rivaroxaban (Xarelto).  No missed doses.    Follow up 1-2 weeks after DCCV.    Lake Bells, PA-C  [facility] [address] [place], [place] [address] [phone]

## 2023-01-25 NOTE — Patient Instructions (Addendum)
Cardioversion scheduled for: Friday, July 12th   - Arrive at the [facility] and go to admitting at [facility]   - Do not eat or drink anything after midnight the night prior to your procedure.   - Take all your morning medication (except diabetic medications) with a sip of water prior to arrival.  - You will not be able to drive home after your procedure.    - Do NOT miss any doses of your blood thinner - if you should miss a dose please notify our office immediately.   - If you feel as if you go back into normal rhythm prior to scheduled cardioversion, please notify our office immediately.   If your procedure is canceled in the cardioversion suite you will be charged a cancellation fee.

## 2023-01-27 ENCOUNTER — Ambulatory Visit (INDEPENDENT_AMBULATORY_CARE_PROVIDER_SITE_OTHER): Payer: Medicare Other | Admitting: Physical Therapy

## 2023-01-27 ENCOUNTER — Encounter: Payer: Self-pay | Admitting: Physical Therapy

## 2023-01-27 DIAGNOSIS — M25661 Stiffness of right knee, not elsewhere classified: Secondary | ICD-10-CM

## 2023-01-27 DIAGNOSIS — R600 Localized edema: Secondary | ICD-10-CM | POA: Diagnosis not present

## 2023-01-27 DIAGNOSIS — M6281 Muscle weakness (generalized): Secondary | ICD-10-CM

## 2023-01-27 DIAGNOSIS — M25561 Pain in right knee: Secondary | ICD-10-CM

## 2023-01-27 DIAGNOSIS — R262 Difficulty in walking, not elsewhere classified: Secondary | ICD-10-CM | POA: Diagnosis not present

## 2023-01-27 NOTE — Therapy (Signed)
OUTPATIENT PHYSICAL THERAPY TREATMENT & PROGRESS NOTE   Patient Name: Melissa Maldonado MRN: [id number] DOB:1940/10/02, 82 y.o., female Today's Date: 01/27/2023  Progress Note Reporting Period 01/06/2023 to 01/27/2023  See note below for Objective Data and Assessment of Progress/Goals.      END OF SESSION:  PT End of Session - 01/27/23 1101     Visit Number 33    Number of Visits 40    Date for PT Re-Evaluation 02/04/23    Authorization Type MCR and BCBS State - [place]    PT Start Time 1057    PT Stop Time 1155    PT Time Calculation (min) 58 min    Activity Tolerance Patient tolerated treatment well;Patient limited by pain    Behavior During Therapy WFL for tasks assessed/performed                     Past Medical History:  Diagnosis Date   Arthritis    Chronic back pain    lumbar stenosis   Dysrhythmia    Early cataracts, bilateral    GERD (gastroesophageal reflux disease)    occasionally but no meds required   History of blood transfusion    History of colon polyps    Hyperlipidemia    takes Lipitor every other day   Hypertension    takes losartan daily   Neuropathy    Osteoarthritis of left knee 04/27/2014   Restless leg    Shortness of breath    with exertion   SOB (shortness of breath) 2013   CPET   Tear of medial meniscus of left knee 09/08/2013   Past Surgical History:  Procedure Laterality Date   APPENDECTOMY     BACK SURGERY  07/20/2008   lumb fusion   COLONOSCOPY  2018   EYE SURGERY     both cataracts   KNEE ARTHROSCOPY WITH MEDIAL MENISECTOMY Left 09/08/2013   Procedure: LEFT KNEE ARTHROSCOPY WITH PARTIAL MEDIAL MENISCECTOMY;  Surgeon: Eulas Post, MD;  Location: [facility];  Service: Orthopedics;  Laterality: Left;   LUMBAR LAMINECTOMY/DECOMPRESSION MICRODISCECTOMY  05/13/2012   Procedure: LUMBAR LAMINECTOMY/DECOMPRESSION MICRODISCECTOMY 1 LEVEL;  Surgeon: Barnett Abu, MD;  Location: [facility];  Service:  Neurosurgery;  Laterality: Bilateral;  Bilateral Lumbar one -two Decompressive Laminectomy   PARTIAL KNEE ARTHROPLASTY Left 04/27/2014   Procedure: LEFT UNICOMPARTMENTAL KNEE;  Surgeon: Eulas Post, MD;  Location: [facility];  Service: Orthopedics;  Laterality: Left;   TOTAL KNEE ARTHROPLASTY Right 11/06/2022   Procedure: RIGHT TOTAL KNEE ARTHROPLASTY;  Surgeon: Melissa Hitch, MD;  Location: [facility];  Service: Orthopedics;  Laterality: Right;   TUBAL LIGATION  1974   UPPER GI ENDOSCOPY     Patient Active Problem List   Diagnosis Date Noted   Persistent atrial fibrillation (HCC) 01/25/2023   Hypercoagulable state due to persistent atrial fibrillation (HCC) 01/25/2023   Status post total right knee replacement 11/06/2022   Arthritis of right acromioclavicular joint 08/28/2021   Impingement syndrome of right shoulder 08/19/2021   Nontraumatic incomplete tear of right rotator cuff 08/19/2021   Paresthesia 12/05/2020   Low back pain with right-sided sciatica 12/05/2020   Trochanteric bursitis, right hip 04/24/2020   Essential hypertension 01/12/2020   Sinus bradycardia 01/12/2020   Dyspnea on exertion 01/12/2020   Lumbar stenosis with neurogenic claudication 02/01/2017   Osteoarthritis of left knee 04/27/2014    PCP: Melissa Brunette MD   REFERRING PROVIDER: Kathryne Hitch, MD  REFERRING DIAG: X52.841 (ICD-10-CM) - Status post total right knee replacement M17.11 (ICD-10-CM) - Unilateral primary osteoarthritis, right knee  THERAPY DIAG:  Stiffness of right knee, not elsewhere classified  Acute pain of right knee  Localized edema  Muscle weakness (generalized)  Difficulty in walking, not elsewhere classified  Rationale for Evaluation and Treatment: Rehabilitation  ONSET DATE: 11/09/2022  SUBJECTIVE:   SUBJECTIVE STATEMENT: She went to [employer] - did the stepper moving seat closer and leg press 50# both legs & 25# right leg.    PERTINENT HISTORY: Hospital Course: Melissa Maldonado is an 82 y.o. female who was admitted 11/06/2022 for operative treatment ofUnilateral primary osteoarthritis, right knee. Patient has severe unremitting pain that affects sleep, daily activities, and work/hobbies. After pre-op clearance the patient was taken to the operating room on 11/06/2022 and underwent  Procedure(s): RIGHT TOTAL KNEE ARTHROPLASTY.   PMH: OA, right rotator cuff tear, LBP w/ sciatica, lumbar stenosis, back surgery lumbar fusion & discectomy,  right hip bursitis, HEN, bradycardia,    PAIN:  NPRS scale: today 0/10 just stiff Pain location: Rt knee  Pain description: sharp  Aggravating factors: doing too much the knee  Relieving factors: unsure, maybe ice   PRECAUTIONS: None  WEIGHT BEARING RESTRICTIONS: No  FALLS:  Has patient fallen in last 6 months? No  LIVING ENVIRONMENT: Lives with: lives with their spouse Lives in: House/apartment Stairs: 4 STE B rails, split level home with 6 steps up/6 steps down U rail  Has following equipment at home: Single point cane, Walker - 2 wheeled, and toilet raiser   OCCUPATION: retired   PLOF: Independent, Independent with basic ADLs, Independent with gait, and Independent with transfers  PATIENT GOALS: be able to walk without pain  NEXT MD VISIT: manipulation 12/24/2022  OBJECTIVE: (objective measures completed at initial evaluation unless otherwise dated)   PATIENT SURVEYS:   12/29/2022: visit 17 45%  12/15/22 visit 10: 41%  11/20/2022 FOTO 55, predicted 61  COGNITION: 11/20/2022 Overall cognitive status:  possible STM impairment, family not present to confirm    SENSATION: 11/20/2022 Not tested  EDEMA:  11/20/2022  Some excessive pitting edema in surgical calf, no temperature or color changes, Homan's negative; on daily Xarelto per her report and per chart, she tells me she has been compliant with this medication   LOWER EXTREMITY ROM:  Active ROM Right eval  Right 11/23/22 Right  11/30/22 Right 12/07/22 Right 12/15/22 Right 12/18/22 Right 12/24/22 S/p manipulation Right 12/28/22 Right 12/30/22 Right 12/31/22  Right 01/04/23 Right 01/06/23 Right 01/13/23 Right 01/27/23  Hip flexion                Hip extension                Hip abduction                Hip adduction                Hip internal rotation                Hip external rotation                Knee flexion 64* supine  Seated P: 81* Seated 64-71* PROM and AAROM Seated A: 71* P: 82* Supine  A: 63*, AAROM in sitting 62*, passive 74* Seated AROM in knee flexion stretch 83* Supine P: 94*  Supine  With LE  94* Seated P: 95* AAROM 96* seated PROM  Seated 96* End of  session PROM  Seated 96* End of session Supine PROM 70* Seated  P: 87*  Knee extension 16* supine with heel prop  Supine With heel prop P: -14* Seated with foot supported on stool 9* PROM Supine  A: Quad set -7* Supine with towel roll under heel 22*, seated PROM 12*  Supine P: -9* Standing P with mob belt -10* Standing with mob belt P: -3*  Standing with mob belt P: -3* Standing with mob belt P: -3* Standing A: -11* Supine P: -12* Supine P: -12*  Ankle dorsiflexion                Ankle plantarflexion                Ankle inversion                Ankle eversion                 (Blank rows = not tested)  LOWER EXTREMITY MMT:  MMT Right eval Left eval Right 12/15/22 Left 12/15/22  Hip flexion 3+ 4+ 4+ 4+  Hip extension      Hip abduction      Hip adduction      Hip internal rotation      Hip external rotation      Knee flexion 4 4 4- 4-  Knee extension 2 4 4 5   Ankle dorsiflexion 5 5    Ankle plantarflexion      Ankle inversion      Ankle eversion       (Blank rows = not tested)    FUNCTIONAL TESTS:  12/15/2022: 5 times sit to stand: 15.5 seconds no UEs, severe offshift from R LE/compensations Timed up and go (TUG): 14.7 seconds SPC   3 minute walk test: 531ft [facility]    11/20/2022 5 times sit to  stand: 43 seconds no UEs; Timed up and go (TUG): 19.5 seconds SPC;   3 minute walk test: 322ft SPC;   GAIT: 01/20/2023:  Ambulation c FWW with maintained knee flexion in stance, decreased Rt knee flexion in swing with mild hip circumduction noted.   12/23/2022: pt amb with cane with antalgic pattern with knee flexion in stance and minimal increase flexion for swing.   11/20/2022 / Evaluation: Distance walked: 332ft Assistive device utilized: Single point cane Level of assistance: Modified independence Comments: slow but steady, able to make sharp turns well at each end of walking course, used SPC on wrong side, foot flat and limited heel-toe pattern, limited R knee flexion/extension in stance/swing phases                     TODAY'S TREATMENT:                                                                                              DATE: 01/27/2023: Therapeutic Exercise: Leg press BLEs 100# 15 reps; RLE only 50# 10 reps 2 sets.  Seated with LLE in 2nd chair ext 5# hang 1 min then hamstring stretch with strap 30 sec, followed by flexion stretch with left foot on edge  of 2nd chair scooting buttocks closer 1 min; 2 sets. Gastroc stretch on step with heel depression 30 sec hold 3 reps Heel raise BLEs without UE support 10 reps Squat lift 10# kettle bell with pelvis over chair to facilitate more knee flexion 10 reps. PT demo & verbal cues. Single knee to chest stretch 15 sec hold 2 reps ea Trunk rotation stretch supine 15 sec hold 2 reps ea Quad stretch supine hooklying with strap knee flexion 30 sec 3 reps Seated in chair with sheet tied on right ankle assisted knee flexion 10 sec hold 10 reps.  Pt amb in clinic bw areas without device.  Manual Therapy: Seated Rt knee flexion c distraction/IR mobilization c movement, contract/relax for flexion gains.  Vasopneumatic (per Pt request) 10 mins 34 deg medium compression in elevation Rt knee    TREATMENT:                                                                                               DATE:01/25/2023: Therapeutic Exercise: Nustep BLEs/BUEs for range seat 7, then 6, then 5 for 3 mins each; first minute of each slow stretch with 5 sec hold flex & ext, then 2 min walking pace with full range of machine setting for flexion & extension. Leg press BLEs 100# 15 reps; RLE only 50# 10 reps 2 sets. PT demo too much and too little weight. PT recommended adding to [employer]. Pt verbalized understanding.  Seated with LLE in 2nd chair ext 5# hang 1 min, then flexion stretch with left foot on edge of 2nd chair scooting buttocks closer 1 min; 2 sets. Seated LAQ & active knee flexion with opposing motion contralateral LE 2 min.  Single knee to chest stretch 15 sec hold 2 reps ea   Pt amb in clinic bw areas without device.     Manual Therapy: Seated Rt knee flexion c distraction/IR mobilization c movement, contract/relax for flexion gains. PT demo & verbal cues on instrument assisted soft tissue with bar sliding on anterior knee especially over scar. Pt verbalized & return demo understanding.      Vasopneumatic (per Pt request) 10 mins 34 deg medium compression in elevation Rt knee    TREATMENT:                                                                                              DATE:01/20/2023: Therapeutic Exercise: Nustep BLEs/BUEs for range seat 7, then 6, then 5 for 3 mins each  Incline gastroc stretch 30 sec x 5 bilateral  6 inch hurdle clear step over with hand assist on bar x 15, performed bilaterally  Manual Therapy: Seated Rt knee flexion c distraction/IR mobilization c movement, contract/relax for flexion gains.  Percussive  device STM to quad during.   Vasopneumatic (per Pt request) 5 mins 50 deg medium compression in elevation Rt knee    HOME EXERCISE PROGRAM: Access Code: [email or web address] URL: [email or web address] Date: 01/19/2023 Prepared by: Vladimir Faster  Exercises - Ankle Alphabet  in Elevation  - 2-4 x daily - 7 x weekly - 1 sets - 1 reps - Supine Single Leg Ankle Pumps  - 1 x daily - 5 x weekly - 1 sets - 10 reps - 5 seconds hold - Quad Setting and Stretching  - 2-4 x daily - 7 x weekly - 5-10 sets - 10 reps - 5 sec hold - Supine Heel Slide with Strap  - 2-3 x daily - 7 x weekly - 2-3 sets - 10 reps - 5 seconds hold - Supine Knee Extension Strengthening  - 1 x daily - 7 x weekly - 2-3 sets - 10 reps - 5 seconds hold - Supine Straight Leg Raises  - 2-3 x daily - 7 x weekly - 2-3 sets - 10 reps - 5 seconds hold - Seated Knee Flexion Extension AROM   - 2-4 x daily - 7 x weekly - 2-3 sets - 10 reps - 5 seconds hold - Seated straight leg lifts  - 2-3 x daily - 7 x weekly - 2-3 sets - 10 reps - 5 seconds hold - Seated Hamstring Stretch with Strap  - 2-4 x daily - 7 x weekly - 1 sets - 3 reps - 20-30 seconds hold - Seated Long Arc Quad  - 1 x daily - 7 x weekly - 2-3 sets - 10 reps - 5 seconds hold - Heel raises near counter  - 1 x daily - 5 x weekly - 1 sets - 10 reps - 5 seconds hold - standing calf stretch with forefoot on small step or brick  - 1 x daily - 7 x weekly - 1 sets - 3 reps - 20-30 seconds hold - Standing Knee Flexion with Counter Support  - 1 x daily - 7 x weekly - 12-3 sets - 10 reps - 5 seconds hold - Standing March with Counter Support  - 1 x daily - 7 x weekly - 2-3 sets - 10 reps - 5 seconds hold - standing knee extension  - 1 x daily - 7 x weekly - 2-3 sets - 10 reps - 5 seconds hold - Gastroc Stretch on Step  - 2 x daily - 7 x weekly - 1 sets - 3 reps - 30 seconds hold - Seated Knee Extension Stretch with Chair  - 3 x daily - 7 x weekly - 1 sets - 2-3 reps - 1 minute hold - Seated Hamstring Stretch with Chair  - 2 x daily - 7 x weekly - 1 sets - 3 reps - 30 seconds hold - Supine Quadriceps Stretch with Strap on Table  - 2 x daily - 7 x weekly - 1 sets - 3 reps - 30 seconds hold   ASSESSMENT: CLINICAL IMPRESSION: PT focusing on ongoing HEP to work on  range & strength. And further instruction in exercises at [facility] for range given which pt appears to understand.    OBJECTIVE IMPAIRMENTS: Abnormal gait, decreased activity tolerance, decreased balance, decreased coordination, decreased mobility, difficulty walking, decreased ROM, decreased strength, hypomobility, increased edema, increased fascial restrictions, impaired flexibility, impaired UE functional use, and pain.   ACTIVITY LIMITATIONS: sitting, standing, squatting, stairs, transfers, bed mobility, and locomotion level  PARTICIPATION LIMITATIONS: cleaning, laundry,  driving, shopping, community activity, occupation, and yard work  PERSONAL FACTORS: Age, Behavior pattern, Education, Fitness, Past/current experiences, Social background, and Time since onset of injury/illness/exacerbation are also affecting patient's functional outcome.   REHAB POTENTIAL: Good  CLINICAL DECISION MAKING: Stable/uncomplicated  EVALUATION COMPLEXITY: Low   GOALS: Goals reviewed with patient? Yes  SHORT TERM GOALS: Target date: 01/15/2023   Will be compliant with appropriate progressive HEP  Baseline: see objective data Goal status: MET 01/13/2023  2.  R knee PROM ext -5* and flexion 100* Baseline: see objective data Goal status: not met 01/13/2023  3.  Will be independent with management techniques for localized edema including wearing compression stockings and appropriate ice routine  Baseline: see objective data Goal status: MET 01/06/2023  4.  Patient ambulates with cane with knee ext in stance & flexion in swing within 75% of normal range. Baseline: see objective data Goal status: not met 01/13/2023   LONG TERM GOALS: Target date: 02/04/2023    MMT right knee >80% of LLE with HH dynameter Baseline: see objective data Goal status: ongoing 01/19/2023  2.  R knee flexion AROM to be at least 105 degrees  Baseline: see objective data Goal status: ongoing 01/19/2023  3.  Will be able to  ascend/descend steps with single rail and reciprocal pattern, no increase in pain  Baseline: see objective data Goal status: ongoing 01/19/2023  4.  Will complete 5x sit to stand test in 15 seconds or less no UEs  Baseline: see objective data Goal status: ongoing 01/19/2023  5.  Will complete TUG test in 13 seconds or less with LRAD to show improved functional balance Baseline: see objective data Goal status: ongoing 01/19/2023  6.  Will ambulate > 569ft with cane & neg ramp / curb modified independent to show improved mobility and community access  Baseline: see objective data Goal status:ongoing 01/19/2023   PLAN:  PT FREQUENCY: 2-5x/wk (daily when clinic open for 2 weeks post manipulation)  PT DURATION: 6 weeks  PLANNED INTERVENTIONS: Therapeutic exercises, Therapeutic activity, Neuromuscular re-education, Balance training, Gait training, Patient/Family education, Self Care, Joint mobilization, Stair training, Orthotic/Fit training, DME instructions, Aquatic Therapy, Dry Needling, Electrical stimulation, Cryotherapy, Moist heat, Taping, Vasopneumatic device, Ultrasound, Ionotophoresis 4mg /ml Dexamethasone, Manual therapy, and Re-evaluation  PLAN FOR NEXT SESSION:   FOTO deferred until next with discharge, continue to educate how to work on range & strength, plan to discharge next week at end of POC.     Vladimir Faster, PT, DPT 01/27/2023, 12:57 PM

## 2023-01-28 ENCOUNTER — Encounter: Payer: Medicare Other | Admitting: Rehabilitative and Restorative Service Providers"

## 2023-01-28 NOTE — Progress Notes (Signed)
Left a voicemail and instructed them to come at 0830 and to be NPO after 0000.   Instructed pt to have a ride home and someone to stay with them for 24 hours after the procedure.

## 2023-01-29 ENCOUNTER — Encounter (HOSPITAL_COMMUNITY): Payer: Self-pay | Admitting: Internal Medicine

## 2023-01-29 ENCOUNTER — Ambulatory Visit (HOSPITAL_COMMUNITY): Payer: Medicare Other | Admitting: Anesthesiology

## 2023-01-29 ENCOUNTER — Encounter (HOSPITAL_COMMUNITY)
Admission: RE | Disposition: A | Discharge status: Home / Self Care | Payer: Self-pay | Source: Home / Self Care | Attending: Internal Medicine

## 2023-01-29 ENCOUNTER — Ambulatory Visit (HOSPITAL_COMMUNITY)
Admission: RE | Admit: 2023-01-29 | Discharge: 2023-01-29 | Disposition: A | Discharge status: Home / Self Care | Payer: Medicare Other | Attending: Internal Medicine | Admitting: Internal Medicine

## 2023-01-29 ENCOUNTER — Other Ambulatory Visit: Payer: Self-pay

## 2023-01-29 ENCOUNTER — Ambulatory Visit (HOSPITAL_BASED_OUTPATIENT_CLINIC_OR_DEPARTMENT_OTHER): Payer: Medicare Other | Admitting: Anesthesiology

## 2023-01-29 DIAGNOSIS — I10 Essential (primary) hypertension: Secondary | ICD-10-CM | POA: Insufficient documentation

## 2023-01-29 DIAGNOSIS — E669 Obesity, unspecified: Secondary | ICD-10-CM | POA: Diagnosis not present

## 2023-01-29 DIAGNOSIS — I4891 Unspecified atrial fibrillation: Secondary | ICD-10-CM

## 2023-01-29 DIAGNOSIS — Z7901 Long term (current) use of anticoagulants: Secondary | ICD-10-CM | POA: Insufficient documentation

## 2023-01-29 DIAGNOSIS — D6869 Other thrombophilia: Secondary | ICD-10-CM | POA: Diagnosis not present

## 2023-01-29 DIAGNOSIS — Z6832 Body mass index (BMI) 32.0-32.9, adult: Secondary | ICD-10-CM | POA: Diagnosis not present

## 2023-01-29 DIAGNOSIS — I4819 Other persistent atrial fibrillation: Secondary | ICD-10-CM

## 2023-01-29 HISTORY — PX: CARDIOVERSION: SHX1299

## 2023-01-29 SURGERY — CARDIOVERSION
Anesthesia: General

## 2023-01-29 MED ORDER — PROPOFOL 10 MG/ML IV BOLUS
INTRAVENOUS | Status: DC | PRN
  Administered 2023-01-29: 60 mg via INTRAVENOUS
  Administered 2023-01-29: 50 mg via INTRAVENOUS
  Administered 2023-01-29: 40 mg via INTRAVENOUS
  Administered 2023-01-29: 20 mg via INTRAVENOUS

## 2023-01-29 MED ORDER — LIDOCAINE 2% (20 MG/ML) 5 ML SYRINGE
INTRAMUSCULAR | Status: DC | PRN
  Administered 2023-01-29: 60 mg via INTRAVENOUS

## 2023-01-29 MED ORDER — SODIUM CHLORIDE 0.9 % IV SOLN: INTRAVENOUS | Status: DC

## 2023-01-29 SURGICAL SUPPLY — 1 items: ELECT DEFIB PAD ADLT CADENCE (PAD) ×1 IMPLANT

## 2023-01-29 NOTE — Anesthesia Postprocedure Evaluation (Signed)
Anesthesia Post Note  Patient: Melissa Maldonado  Procedure(s) Performed: CARDIOVERSION     Patient location during evaluation: PACU Anesthesia Type: General Level of consciousness: awake and alert Pain management: pain level controlled Vital Signs Assessment: post-procedure vital signs reviewed and stable Respiratory status: spontaneous breathing, nonlabored ventilation and respiratory function stable Cardiovascular status: stable, blood pressure returned to baseline and bradycardic Anesthetic complications: no   No notable events documented.  Last Vitals:  Vitals:   01/29/23 1114 01/29/23 1124  BP:  104/64  Pulse: (!) 45 (!) 39  Resp: 16 16  Temp:  36.7 C  SpO2: 100% 100%    Last Pain:  Vitals:   01/29/23 1124  TempSrc: Temporal  PainSc: 0-No pain                 Beryle Lathe

## 2023-01-29 NOTE — CV Procedure (Addendum)
Procedure: Electrical Cardioversion Indications:  Atrial Fibrillation  Procedure Details:  Consent: Risks of procedure as well as the alternatives and risks of each were explained to the (patient/caregiver).  Consent for procedure obtained.  Time Out: Verified patient identification, verified procedure, site/side was marked, verified correct patient position, special equipment/implants available, medications/allergies/relevent history reviewed, required imaging and test results available. PERFORMED.  Patient placed on cardiac monitor, pulse oximetry, supplemental oxygen as necessary.  Sedation given: propofol, lidocaine; see documentation Pacer pads placed anterior and posterior chest.  Cardioverted 3 time(s).  Cardioversion with synchronized biphasic 200J shock.  Evaluation: Findings: Post procedure EKG shows:  Sinus bradycardia, PACs Complications: None Patient did tolerate procedure well.  Time Spent Directly with the Patient:  20 minutes   Melissa Maldonado 01/29/2023, 11:02 AM

## 2023-01-29 NOTE — Transfer of Care (Signed)
Immediate Anesthesia Transfer of Care Note  Patient: Melissa Maldonado  Procedure(s) Performed: CARDIOVERSION  Patient Location: PACU  Anesthesia Type:General  Level of Consciousness: sedated  Airway & Oxygen Therapy: Patient connected to nasal cannula oxygen  Post-op Assessment: Report given to RN and Post -op Vital signs reviewed and stable  Post vital signs: stable  Last Vitals:  Vitals Value Taken Time  BP 117/49 01/29/23 1112  Temp    Pulse 45 01/29/23 1114  Resp 16 01/29/23 1114  SpO2 100 % 01/29/23 1114    Last Pain:  Vitals:   01/29/23 0840  TempSrc:   PainSc: 0-No pain         Complications: No notable events documented.

## 2023-01-29 NOTE — Transfer of Care (Deleted)
Immediate Anesthesia Transfer of Care Note  Patient: Melissa Maldonado  Procedure(s) Performed: CARDIOVERSION  Patient Location: PACU  Anesthesia Type:General  Level of Consciousness: sedated  Airway & Oxygen Therapy: Patient connected to nasal cannula oxygen  Post-op Assessment: Report given to RN  Post vital signs: stable  Last Vitals:  Vitals Value Taken Time  BP    Temp    Pulse 51 01/29/23 1101  Resp 16 01/29/23 1101  SpO2 98 % 01/29/23 1101  Vitals shown include unfiled device data.  Last Pain:  Vitals:   01/29/23 0840  TempSrc:   PainSc: 0-No pain         Complications: No notable events documented.

## 2023-01-29 NOTE — Discharge Instructions (Signed)

## 2023-01-29 NOTE — Anesthesia Preprocedure Evaluation (Addendum)
Anesthesia Evaluation  Patient identified by MRN, date of birth, ID band Patient awake    Reviewed: Allergy & Precautions, NPO status , Patient's Chart, lab work & pertinent test results  History of Anesthesia Complications Negative for: history of anesthetic complications  Airway Mallampati: I  TM Distance: >3 FB Neck ROM: Full    Dental  (+) Dental Advisory Given, Partial Upper   Pulmonary neg pulmonary ROS   Pulmonary exam normal        Cardiovascular hypertension, Pt. on medications + dysrhythmias Atrial Fibrillation  Rhythm:Irregular Rate:Tachycardia   '24 TTE - EF 60-65%, trivial MR    Neuro/Psych negative neurological ROS  negative psych ROS   GI/Hepatic Neg liver ROS,GERD  Controlled,,  Endo/Other  negative endocrine ROS    Renal/GU Renal InsufficiencyRenal disease     Musculoskeletal  (+) Arthritis ,    Abdominal   Peds  Hematology  On xarelto    Anesthesia Other Findings   Reproductive/Obstetrics                             Anesthesia Physical Anesthesia Plan  ASA: 3  Anesthesia Plan: General   Post-op Pain Management:    Induction: Intravenous  PONV Risk Score and Plan: 3 and Treatment may vary due to age or medical condition and Propofol infusion  Airway Management Planned: Natural Airway and Mask  Additional Equipment: None  Intra-op Plan:   Post-operative Plan:   Informed Consent: I have reviewed the patients History and Physical, chart, labs and discussed the procedure including the risks, benefits and alternatives for the proposed anesthesia with the patient or authorized representative who has indicated his/her understanding and acceptance.       Plan Discussed with: CRNA and Anesthesiologist  Anesthesia Plan Comments:        Anesthesia Quick Evaluation

## 2023-01-29 NOTE — Interval H&P Note (Signed)
History and Physical Interval Note:  01/29/2023 8:21 AM  Melissa Maldonado  has presented today for surgery, with the diagnosis of afib.  The various methods of treatment have been discussed with the patient and family. After consideration of risks, benefits and other options for treatment, the patient has consented to  Procedure(s): CARDIOVERSION (N/A) as a surgical intervention.  The patient's history has been reviewed, patient examined, no change in status, stable for surgery.  I have reviewed the patient's chart and labs.  Questions were answered to the patient's satisfaction.     Maisie Fus

## 2023-02-01 ENCOUNTER — Encounter: Payer: Self-pay | Admitting: Physical Therapy

## 2023-02-01 ENCOUNTER — Ambulatory Visit: Payer: Medicare Other | Admitting: Physical Therapy

## 2023-02-01 DIAGNOSIS — M6281 Muscle weakness (generalized): Secondary | ICD-10-CM | POA: Diagnosis not present

## 2023-02-01 DIAGNOSIS — R600 Localized edema: Secondary | ICD-10-CM

## 2023-02-01 DIAGNOSIS — M25561 Pain in right knee: Secondary | ICD-10-CM

## 2023-02-01 DIAGNOSIS — M25661 Stiffness of right knee, not elsewhere classified: Secondary | ICD-10-CM | POA: Diagnosis not present

## 2023-02-01 DIAGNOSIS — R262 Difficulty in walking, not elsewhere classified: Secondary | ICD-10-CM

## 2023-02-01 NOTE — Patient Instructions (Signed)
Machines: Knee extension -  using both legs up & down 20# - 25# for 15 reps Up with both legs, then right leg only hold & lower down 10# 10 reps 2 sets Right leg only up & down 5# 10 reps 1-2 sets  Knee Flexion or Hamstring curl seated  Using both leg 35# 15 reps  Right leg only 15# 10 reps 2 sets  Leg Press  Both legs 100# 15 reps  Right leg only 50# 10 reps 2 sets

## 2023-02-01 NOTE — Therapy (Signed)
OUTPATIENT PHYSICAL THERAPY TREATMENT   Patient Name: Melissa Maldonado MRN: [id number] DOB:07-08-1941, 82 y.o., female Today's Date: 02/01/2023   END OF SESSION:  PT End of Session - 02/01/23 0918     Visit Number 34    Number of Visits 40    Date for PT Re-Evaluation 02/04/23    Authorization Type MCR and BCBS State - [place]    PT Start Time 0920    PT Stop Time 1023    PT Time Calculation (min) 63 min    Activity Tolerance Patient tolerated treatment well;Patient limited by pain    Behavior During Therapy WFL for tasks assessed/performed                      Past Medical History:  Diagnosis Date   Arthritis    Chronic back pain    lumbar stenosis   Dysrhythmia    Early cataracts, bilateral    GERD (gastroesophageal reflux disease)    occasionally but no meds required   History of blood transfusion    History of colon polyps    Hyperlipidemia    takes Lipitor every other day   Hypertension    takes losartan daily   Neuropathy    Osteoarthritis of left knee 04/27/2014   Restless leg    Shortness of breath    with exertion   SOB (shortness of breath) 2013   CPET   Tear of medial meniscus of left knee 09/08/2013   Past Surgical History:  Procedure Laterality Date   APPENDECTOMY     BACK SURGERY  07/20/2008   lumb fusion   CARDIOVERSION N/A 01/29/2023   Procedure: CARDIOVERSION;  Surgeon: Maisie Fus, MD;  Location: [facility];  Service: Cardiovascular;  Laterality: N/A;   COLONOSCOPY  2018   EYE SURGERY     both cataracts   KNEE ARTHROSCOPY WITH MEDIAL MENISECTOMY Left 09/08/2013   Procedure: LEFT KNEE ARTHROSCOPY WITH PARTIAL MEDIAL MENISCECTOMY;  Surgeon: Eulas Post, MD;  Location: [facility];  Service: Orthopedics;  Laterality: Left;   LUMBAR LAMINECTOMY/DECOMPRESSION MICRODISCECTOMY  05/13/2012   Procedure: LUMBAR LAMINECTOMY/DECOMPRESSION MICRODISCECTOMY 1 LEVEL;  Surgeon: Barnett Abu, MD;  Location: [facility];   Service: Neurosurgery;  Laterality: Bilateral;  Bilateral Lumbar one -two Decompressive Laminectomy   PARTIAL KNEE ARTHROPLASTY Left 04/27/2014   Procedure: LEFT UNICOMPARTMENTAL KNEE;  Surgeon: Eulas Post, MD;  Location:  SURGERY CENTER;  Service: Orthopedics;  Laterality: Left;   TOTAL KNEE ARTHROPLASTY Right 11/06/2022   Procedure: RIGHT TOTAL KNEE ARTHROPLASTY;  Surgeon: Kathryne Hitch, MD;  Location: [facility];  Service: Orthopedics;  Laterality: Right;   TUBAL LIGATION  1974   UPPER GI ENDOSCOPY     Patient Active Problem List   Diagnosis Date Noted   Persistent atrial fibrillation (HCC) 01/25/2023   Hypercoagulable state due to persistent atrial fibrillation (HCC) 01/25/2023   Status post total right knee replacement 11/06/2022   Arthritis of right acromioclavicular joint 08/28/2021   Impingement syndrome of right shoulder 08/19/2021   Nontraumatic incomplete tear of right rotator cuff 08/19/2021   Paresthesia 12/05/2020   Low back pain with right-sided sciatica 12/05/2020   Trochanteric bursitis, right hip 04/24/2020   Essential hypertension 01/12/2020   Sinus bradycardia 01/12/2020   Dyspnea on exertion 01/12/2020   Lumbar stenosis with neurogenic claudication 02/01/2017   Osteoarthritis of left knee 04/27/2014    PCP: Merri Brunette MD   REFERRING PROVIDER: Doneen Poisson  Y, MD  REFERRING DIAG: [phone] (ICD-10-CM) - Status post total right knee replacement M17.11 (ICD-10-CM) - Unilateral primary osteoarthritis, right knee  THERAPY DIAG:  Stiffness of right knee, not elsewhere classified  Acute pain of right knee  Localized edema  Muscle weakness (generalized)  Difficulty in walking, not elsewhere classified  Rationale for Evaluation and Treatment: Rehabilitation  ONSET DATE: 11/09/2022  SUBJECTIVE:   SUBJECTIVE STATEMENT: She went to [employer] Thursday & Saturday doing leg press and recumbent stepper. She did exercises daily  at home.  She had cardioversion on Friday and heart is back in rhythm.   PERTINENT HISTORY: Hospital Course: ALIHA Maldonado is an 82 y.o. female who was admitted 11/06/2022 for operative treatment ofUnilateral primary osteoarthritis, right knee. Patient has severe unremitting pain that affects sleep, daily activities, and work/hobbies. After pre-op clearance the patient was taken to the operating room on 11/06/2022 and underwent  Procedure(s): RIGHT TOTAL KNEE ARTHROPLASTY.   PMH: OA, right rotator cuff tear, LBP w/ sciatica, lumbar stenosis, back surgery lumbar fusion & discectomy,  right hip bursitis, HEN, bradycardia,    PAIN:  NPRS scale: today 0/10 just stiff Pain location: Rt knee  Pain description: sharp  Aggravating factors: doing too much the knee  Relieving factors: unsure, maybe ice   PRECAUTIONS: None  WEIGHT BEARING RESTRICTIONS: No  FALLS:  Has patient fallen in last 6 months? No  LIVING ENVIRONMENT: Lives with: lives with their spouse Lives in: House/apartment Stairs: 4 STE B rails, split level home with 6 steps up/6 steps down U rail  Has following equipment at home: Single point cane, Walker - 2 wheeled, and toilet raiser   OCCUPATION: retired   PLOF: Independent, Independent with basic ADLs, Independent with gait, and Independent with transfers  PATIENT GOALS: be able to walk without pain  NEXT MD VISIT: manipulation 12/24/2022  OBJECTIVE: (objective measures completed at initial evaluation unless otherwise dated)   PATIENT SURVEYS:   12/29/2022: visit 17 45%  12/15/22 visit 10: 41%  11/20/2022 FOTO 55, predicted 61  COGNITION: 11/20/2022 Overall cognitive status:  possible STM impairment, family not present to confirm    SENSATION: 11/20/2022 Not tested  EDEMA:  11/20/2022  Some excessive pitting edema in surgical calf, no temperature or color changes, Homan's negative; on daily Xarelto per her report and per chart, she tells me she has been compliant  with this medication   LOWER EXTREMITY ROM:  Active ROM Right eval Right 11/23/22 Right  11/30/22 Right 12/07/22 Right 12/15/22 Right 12/18/22 Right 12/24/22 S/p manipulation Right 12/28/22 Right 12/30/22 Right 12/31/22  Right 01/04/23 Right 01/06/23 Right 01/13/23 Right 01/27/23  Hip flexion                Hip extension                Hip abduction                Hip adduction                Hip internal rotation                Hip external rotation                Knee flexion 64* supine  Seated P: 81* Seated 64-71* PROM and AAROM Seated A: 71* P: 82* Supine  A: 63*, AAROM in sitting 62*, passive 74* Seated AROM in knee flexion stretch 83* Supine P: 94*  Supine  With LE  94* Seated P: 95* AAROM 96* seated PROM  Seated 96* End of session PROM  Seated 96* End of session Supine PROM 70* Seated  P: 87*  Knee extension 16* supine with heel prop  Supine With heel prop P: -14* Seated with foot supported on stool 9* PROM Supine  A: Quad set -7* Supine with towel roll under heel 22*, seated PROM 12*  Supine P: -9* Standing P with mob belt -10* Standing with mob belt P: -3*  Standing with mob belt P: -3* Standing with mob belt P: -3* Standing A: -11* Supine P: -12* Supine P: -12*  Ankle dorsiflexion                Ankle plantarflexion                Ankle inversion                Ankle eversion                 (Blank rows = not tested)  LOWER EXTREMITY MMT:  MMT Right eval Left eval Right 12/15/22 Left 12/15/22  Hip flexion 3+ 4+ 4+ 4+  Hip extension      Hip abduction      Hip adduction      Hip internal rotation      Hip external rotation      Knee flexion 4 4 4- 4-  Knee extension 2 4 4 5   Ankle dorsiflexion 5 5    Ankle plantarflexion      Ankle inversion      Ankle eversion       (Blank rows = not tested)    FUNCTIONAL TESTS:  02/01/2023: 5 times sit to stand: 12.9 seconds no UEs, severe offshift from R LE/compensation Timed Up & GO 10.03 sec no  device  12/15/2022: 5 times sit to stand: 15.5 seconds no UEs, severe offshift from R LE/compensations Timed up and go (TUG): 14.7 seconds SPC   3 minute walk test: 551ft SPC    11/20/2022 5 times sit to stand: 43 seconds no UEs; Timed up and go (TUG): 19.5 seconds SPC;   3 minute walk test: 331ft SPC;   GAIT: 02/01/2023: 3-Minute Walk Test 566' no device Gait Velocity: 3.38 ft/sec Stairs with right rail alternating pattern modified independent; limited knee flexion results descending with right hip rise during stance  Pt neg ramp & curb without device modified independent (increased time)   01/20/2023:  Ambulation c FWW with maintained knee flexion in stance, decreased Rt knee flexion in swing with mild hip circumduction noted.   12/23/2022: pt amb with cane with antalgic pattern with knee flexion in stance and minimal increase flexion for swing.   11/20/2022 / Evaluation: Distance walked: 378ft Assistive device utilized: Single point cane Level of assistance: Modified independence Comments: slow but steady, able to make sharp turns well at each end of walking course, used SPC on wrong side, foot flat and limited heel-toe pattern, limited R knee flexion/extension in stance/swing phases                     TODAY'S TREATMENT:  DATE: 02/01/2023: Therapeutic Exercise: PT reviewed Medicare requirement of progress to cover further PT.  PT recommending ongoing HEP & [employer] to address range, muscle endurance, flexibility (both joint & muscles crossing knee) and balance. Pt verbalized understanding.  Nustep BLEs/BUEs for range seat 7, then 6, then 5 for 3 mins each; first minute of each slow stretch with 5 sec hold flex & ext, then 2 min walking pace with full range of machine setting for flexion & extension. Knee ext machine BLEs 20# 15 reps;  BLE concentric, RLE only isometric / eccentric 10# 10  reps 2 sets; RLE only concentric & eccentric 5 # 10 reps. Knee flexion machine BLEs 35# 15 reps, right leg only 15# 10 reps 2 sets. Leg press BLEs 100# 15 reps; RLE only 50# 10 reps 2 sets.  PT instructed in recommendations for above 3 machines in addition to stepper & bike at [employer]. Pt verbalized understanding.  Therapeutic Activities: See objective for functional activities & gait testing.   Vasopneumatic (per Pt request) 10 mins 34 deg medium compression in elevation Rt knee   TREATMENT:                                                                                              DATE: 01/27/2023: Therapeutic Exercise: Leg press BLEs 100# 15 reps; RLE only 50# 10 reps 2 sets.  Seated with LLE in 2nd chair ext 5# hang 1 min then hamstring stretch with strap 30 sec, followed by flexion stretch with left foot on edge of 2nd chair scooting buttocks closer 1 min; 2 sets. Gastroc stretch on step with heel depression 30 sec hold 3 reps Heel raise BLEs without UE support 10 reps Squat lift 10# kettle bell with pelvis over chair to facilitate more knee flexion 10 reps. PT demo & verbal cues. Single knee to chest stretch 15 sec hold 2 reps ea Trunk rotation stretch supine 15 sec hold 2 reps ea Quad stretch supine hooklying with strap knee flexion 30 sec 3 reps Seated in chair with sheet tied on right ankle assisted knee flexion 10 sec hold 10 reps.  Pt amb in clinic bw areas without device.  Manual Therapy: Seated Rt knee flexion c distraction/IR mobilization c movement, contract/relax for flexion gains.  Vasopneumatic (per Pt request) 10 mins 34 deg medium compression in elevation Rt knee    TREATMENT:                                                                                              DATE:01/25/2023: Therapeutic Exercise: Nustep BLEs/BUEs for range seat 7, then 6, then 5 for 3 mins each; first minute of each slow stretch with 5 sec hold flex & ext, then 2 min  walking pace  with full range of machine setting for flexion & extension. Leg press BLEs 100# 15 reps; RLE only 50# 10 reps 2 sets. PT demo too much and too little weight. PT recommended adding to [employer]. Pt verbalized understanding.  Seated with LLE in 2nd chair ext 5# hang 1 min, then flexion stretch with left foot on edge of 2nd chair scooting buttocks closer 1 min; 2 sets. Seated LAQ & active knee flexion with opposing motion contralateral LE 2 min.  Single knee to chest stretch 15 sec hold 2 reps ea   Pt amb in clinic bw areas without device.     Manual Therapy: Seated Rt knee flexion c distraction/IR mobilization c movement, contract/relax for flexion gains. PT demo & verbal cues on instrument assisted soft tissue with bar sliding on anterior knee especially over scar. Pt verbalized & return demo understanding.      Vasopneumatic (per Pt request) 10 mins 34 deg medium compression in elevation Rt knee    HOME EXERCISE PROGRAM: Access Code: [email or web address] URL: [email or web address] Date: 01/19/2023 Prepared by: Vladimir Faster  Exercises - Ankle Alphabet in Elevation  - 2-4 x daily - 7 x weekly - 1 sets - 1 reps - Supine Single Leg Ankle Pumps  - 1 x daily - 5 x weekly - 1 sets - 10 reps - 5 seconds hold - Quad Setting and Stretching  - 2-4 x daily - 7 x weekly - 5-10 sets - 10 reps - 5 sec hold - Supine Heel Slide with Strap  - 2-3 x daily - 7 x weekly - 2-3 sets - 10 reps - 5 seconds hold - Supine Knee Extension Strengthening  - 1 x daily - 7 x weekly - 2-3 sets - 10 reps - 5 seconds hold - Supine Straight Leg Raises  - 2-3 x daily - 7 x weekly - 2-3 sets - 10 reps - 5 seconds hold - Seated Knee Flexion Extension AROM   - 2-4 x daily - 7 x weekly - 2-3 sets - 10 reps - 5 seconds hold - Seated straight leg lifts  - 2-3 x daily - 7 x weekly - 2-3 sets - 10 reps - 5 seconds hold - Seated Hamstring Stretch with Strap  - 2-4 x daily - 7 x weekly - 1 sets - 3 reps - 20-30  seconds hold - Seated Long Arc Quad  - 1 x daily - 7 x weekly - 2-3 sets - 10 reps - 5 seconds hold - Heel raises near counter  - 1 x daily - 5 x weekly - 1 sets - 10 reps - 5 seconds hold - standing calf stretch with forefoot on small step or brick  - 1 x daily - 7 x weekly - 1 sets - 3 reps - 20-30 seconds hold - Standing Knee Flexion with Counter Support  - 1 x daily - 7 x weekly - 12-3 sets - 10 reps - 5 seconds hold - Standing March with Counter Support  - 1 x daily - 7 x weekly - 2-3 sets - 10 reps - 5 seconds hold - standing knee extension  - 1 x daily - 7 x weekly - 2-3 sets - 10 reps - 5 seconds hold - Gastroc Stretch on Step  - 2 x daily - 7 x weekly - 1 sets - 3 reps - 30 seconds hold - Seated Knee Extension Stretch with Chair  - 3 x daily - 7 x  weekly - 1 sets - 2-3 reps - 1 minute hold - Seated Hamstring Stretch with Chair  - 2 x daily - 7 x weekly - 1 sets - 3 reps - 30 seconds hold - Supine Quadriceps Stretch with Strap on Table  - 2 x daily - 7 x weekly - 1 sets - 3 reps - 30 seconds hold   ASSESSMENT: CLINICAL IMPRESSION: Pt appears to understand instruction in exercises at [employer] for range given which pt appears to understand.  Timed Up & Go, 5 times sit to stand and gait LTGs met today. Pt has plateaued in progress with range.   OBJECTIVE IMPAIRMENTS: Abnormal gait, decreased activity tolerance, decreased balance, decreased coordination, decreased mobility, difficulty walking, decreased ROM, decreased strength, hypomobility, increased edema, increased fascial restrictions, impaired flexibility, impaired UE functional use, and pain.   ACTIVITY LIMITATIONS: sitting, standing, squatting, stairs, transfers, bed mobility, and locomotion level  PARTICIPATION LIMITATIONS: cleaning, laundry, driving, shopping, community activity, occupation, and yard work  PERSONAL FACTORS: Age, Behavior pattern, Education, Fitness, Past/current experiences, Social background, and Time since  onset of injury/illness/exacerbation are also affecting patient's functional outcome.   REHAB POTENTIAL: Good  CLINICAL DECISION MAKING: Stable/uncomplicated  EVALUATION COMPLEXITY: Low   GOALS: Goals reviewed with patient? Yes  SHORT TERM GOALS: Target date: 01/15/2023   Will be compliant with appropriate progressive HEP  Baseline: see objective data Goal status: MET 01/13/2023  2.  R knee PROM ext -5* and flexion 100* Baseline: see objective data Goal status: not met 01/13/2023  3.  Will be independent with management techniques for localized edema including wearing compression stockings and appropriate ice routine  Baseline: see objective data Goal status: MET 01/06/2023  4.  Patient ambulates with cane with knee ext in stance & flexion in swing within 75% of normal range. Baseline: see objective data Goal status: not met 01/13/2023   LONG TERM GOALS: Target date: 02/04/2023    MMT right knee >80% of LLE with HH dynameter Baseline: see objective data Goal status: ongoing 01/19/2023  2.  R knee flexion AROM to be at least 105 degrees  Baseline: see objective data Goal status: ongoing 01/19/2023  3.  Will be able to ascend/descend steps with single rail and reciprocal pattern, no increase in pain  Baseline: see objective data Goal status: MET 02/01/2023  4.  Will complete 5x sit to stand test in 15 seconds or less no UEs  Baseline: see objective data Goal status: MET 02/01/2023  5.  Will complete TUG test in 13 seconds or less with LRAD to show improved functional balance Baseline: see objective data Goal status: MET 02/01/2023  6.  Will ambulate > 571ft with cane & neg ramp / curb modified independent to show improved mobility and community access  Baseline: see objective data Goal status:  MET 02/01/2023   PLAN:  PT FREQUENCY: 2-5x/wk (daily when clinic open for 2 weeks post manipulation)  PT DURATION: 6 weeks  PLANNED INTERVENTIONS: Therapeutic exercises,  Therapeutic activity, Neuromuscular re-education, Balance training, Gait training, Patient/Family education, Self Care, Joint mobilization, Stair training, Orthotic/Fit training, DME instructions, Aquatic Therapy, Dry Needling, Electrical stimulation, Cryotherapy, Moist heat, Taping, Vasopneumatic device, Ultrasound, Ionotophoresis 4mg /ml Dexamethasone, Manual therapy, and Re-evaluation  PLAN FOR NEXT SESSION:   assess remaining LTGs, do FOTO, discharge    Vladimir Faster, PT, DPT 02/01/2023, 10:19 AM

## 2023-02-03 ENCOUNTER — Ambulatory Visit (INDEPENDENT_AMBULATORY_CARE_PROVIDER_SITE_OTHER): Payer: Medicare Other | Admitting: Physical Therapy

## 2023-02-03 ENCOUNTER — Encounter: Payer: Self-pay | Admitting: Physical Therapy

## 2023-02-03 DIAGNOSIS — R262 Difficulty in walking, not elsewhere classified: Secondary | ICD-10-CM

## 2023-02-03 DIAGNOSIS — R600 Localized edema: Secondary | ICD-10-CM | POA: Diagnosis not present

## 2023-02-03 DIAGNOSIS — M25561 Pain in right knee: Secondary | ICD-10-CM | POA: Diagnosis not present

## 2023-02-03 DIAGNOSIS — M25661 Stiffness of right knee, not elsewhere classified: Secondary | ICD-10-CM

## 2023-02-03 DIAGNOSIS — M6281 Muscle weakness (generalized): Secondary | ICD-10-CM | POA: Diagnosis not present

## 2023-02-03 NOTE — Therapy (Signed)
OUTPATIENT PHYSICAL THERAPY TREATMENT & DISCHARGE SUMMARY   Patient Name: Melissa Maldonado MRN: [id number] DOB:1940/08/23, 82 y.o., female Today's Date: 02/03/2023  PHYSICAL THERAPY DISCHARGE SUMMARY  Visits from Start of Care: 35  Current functional level related to goals / functional outcomes: See below   Remaining deficits: See below   Education / Equipment: Patient was educated in HEP & ongoing fitness plan at [employer] which appears to understand.   Patient agrees to discharge. Patient goals were partially met. Patient is being discharged due to meeting the stated rehab goals. And lack of progress in range.    END OF SESSION:  PT End of Session - 02/03/23 1014     Visit Number 35    Number of Visits 40    Date for PT Re-Evaluation 02/04/23    Authorization Type MCR and BCBS State - [place]    PT Start Time 1014    PT Stop Time 1047    PT Time Calculation (min) 33 min    Activity Tolerance Patient tolerated treatment well;Patient limited by pain    Behavior During Therapy WFL for tasks assessed/performed                       Past Medical History:  Diagnosis Date   Arthritis    Chronic back pain    lumbar stenosis   Dysrhythmia    Early cataracts, bilateral    GERD (gastroesophageal reflux disease)    occasionally but no meds required   History of blood transfusion    History of colon polyps    Hyperlipidemia    takes Lipitor every other day   Hypertension    takes losartan daily   Neuropathy    Osteoarthritis of left knee 04/27/2014   Restless leg    Shortness of breath    with exertion   SOB (shortness of breath) 2013   CPET   Tear of medial meniscus of left knee 09/08/2013   Past Surgical History:  Procedure Laterality Date   APPENDECTOMY     BACK SURGERY  07/20/2008   lumb fusion   CARDIOVERSION N/A 01/29/2023   Procedure: CARDIOVERSION;  Surgeon: Maisie Fus, MD;  Location: [facility];  Service: Cardiovascular;   Laterality: N/A;   COLONOSCOPY  2018   EYE SURGERY     both cataracts   KNEE ARTHROSCOPY WITH MEDIAL MENISECTOMY Left 09/08/2013   Procedure: LEFT KNEE ARTHROSCOPY WITH PARTIAL MEDIAL MENISCECTOMY;  Surgeon: Eulas Post, MD;  Location: [facility];  Service: Orthopedics;  Laterality: Left;   LUMBAR LAMINECTOMY/DECOMPRESSION MICRODISCECTOMY  05/13/2012   Procedure: LUMBAR LAMINECTOMY/DECOMPRESSION MICRODISCECTOMY 1 LEVEL;  Surgeon: Barnett Abu, MD;  Location: [facility];  Service: Neurosurgery;  Laterality: Bilateral;  Bilateral Lumbar one -two Decompressive Laminectomy   PARTIAL KNEE ARTHROPLASTY Left 04/27/2014   Procedure: LEFT UNICOMPARTMENTAL KNEE;  Surgeon: Eulas Post, MD;  Location: [facility];  Service: Orthopedics;  Laterality: Left;   TOTAL KNEE ARTHROPLASTY Right 11/06/2022   Procedure: RIGHT TOTAL KNEE ARTHROPLASTY;  Surgeon: Kathryne Hitch, MD;  Location: [facility];  Service: Orthopedics;  Laterality: Right;   TUBAL LIGATION  1974   UPPER GI ENDOSCOPY     Patient Active Problem List   Diagnosis Date Noted   Persistent atrial fibrillation (HCC) 01/25/2023   Hypercoagulable state due to persistent atrial fibrillation (HCC) 01/25/2023   Status post total right knee replacement 11/06/2022   Arthritis of right acromioclavicular  joint 08/28/2021   Impingement syndrome of right shoulder 08/19/2021   Nontraumatic incomplete tear of right rotator cuff 08/19/2021   Paresthesia 12/05/2020   Low back pain with right-sided sciatica 12/05/2020   Trochanteric bursitis, right hip 04/24/2020   Essential hypertension 01/12/2020   Sinus bradycardia 01/12/2020   Dyspnea on exertion 01/12/2020   Lumbar stenosis with neurogenic claudication 02/01/2017   Osteoarthritis of left knee 04/27/2014    PCP: Merri Brunette MD   REFERRING PROVIDER: Kathryne Hitch, MD  REFERRING DIAG: [phone] (ICD-10-CM) - Status post total right knee replacement  M17.11 (ICD-10-CM) - Unilateral primary osteoarthritis, right knee  THERAPY DIAG:  Stiffness of right knee, not elsewhere classified  Acute pain of right knee  Muscle weakness (generalized)  Localized edema  Difficulty in walking, not elsewhere classified  Rationale for Evaluation and Treatment: Rehabilitation  ONSET DATE: 11/09/2022  SUBJECTIVE:   SUBJECTIVE STATEMENT: She went to [employer] yesterday and employee helped with understanding machines. She used knee extension, knee flexion, leg press, recumbent stepper and recumbent bike full revolutions. She feels good about machines.   PERTINENT HISTORY: Hospital Course: Melissa Maldonado is an 82 y.o. female who was admitted 11/06/2022 for operative treatment ofUnilateral primary osteoarthritis, right knee. Patient has severe unremitting pain that affects sleep, daily activities, and work/hobbies. After pre-op clearance the patient was taken to the operating room on 11/06/2022 and underwent  Procedure(s): RIGHT TOTAL KNEE ARTHROPLASTY.   PMH: OA, right rotator cuff tear, LBP w/ sciatica, lumbar stenosis, back surgery lumbar fusion & discectomy,  right hip bursitis, HEN, bradycardia,    PAIN:  NPRS scale: today 0/10 just stiff Pain location: Rt knee  Pain description: sharp  Aggravating factors: doing too much the knee  Relieving factors: unsure, maybe ice   PRECAUTIONS: None  WEIGHT BEARING RESTRICTIONS: No  FALLS:  Has patient fallen in last 6 months? No  LIVING ENVIRONMENT: Lives with: lives with their spouse Lives in: House/apartment Stairs: 4 STE B rails, split level home with 6 steps up/6 steps down U rail  Has following equipment at home: Single point cane, Walker - 2 wheeled, and toilet raiser   OCCUPATION: retired   PLOF: Independent, Independent with basic ADLs, Independent with gait, and Independent with transfers  PATIENT GOALS: be able to walk without pain  NEXT MD VISIT: manipulation  12/24/2022  OBJECTIVE: (objective measures completed at initial evaluation unless otherwise dated)   PATIENT SURVEYS:  02/03/2023 visit 35 55% 12/29/2022: visit 17 45%  12/15/22 visit 10: 41%  11/20/2022 FOTO 55, predicted 61  COGNITION: 11/20/2022 Overall cognitive status:  possible STM impairment, family not present to confirm    SENSATION: 11/20/2022 Not tested  EDEMA:  11/20/2022  Some excessive pitting edema in surgical calf, no temperature or color changes, Homan's negative; on daily Xarelto per her report and per chart, she tells me she has been compliant with this medication   LOWER EXTREMITY ROM:  Active ROM Right eval Right 11/23/22 Right  11/30/22 Right 12/07/22 Right 12/15/22 Right 12/18/22 Right 12/24/22 S/p manipulation Right 12/28/22 Right 12/30/22 Right 12/31/22  Right 01/04/23 Right 01/06/23 Right 01/13/23 Right 01/27/23 Right 02/03/23  Hip flexion                 Hip extension                 Hip abduction                 Hip adduction  Hip internal rotation                 Hip external rotation                 Knee flexion 64* supine  Seated P: 81* Seated 64-71* PROM and AAROM Seated A: 71* P: 82* Supine  A: 63*, AAROM in sitting 62*, passive 74* Seated AROM in knee flexion stretch 83* Supine P: 94*  Supine  With LE  94* Seated P: 95* AAROM 96* seated PROM  Seated 96* End of session PROM  Seated 96* End of session Supine PROM 70* Seated  P: 87* Seated P: 89* A: 85*  Knee extension 16* supine with heel prop  Supine With heel prop P: -14* Seated with foot supported on stool 9* PROM Supine  A: Quad set -7* Supine with towel roll under heel 22*, seated PROM 12*  Supine P: -9* Standing P with mob belt -10* Standing with mob belt P: -3*  Standing with mob belt P: -3* Standing with mob belt P: -3* Standing A: -11* Supine P: -12* Supine P: -12* Seated A: -19* Standing A: -18* Supine P: -10*  Ankle dorsiflexion                  Ankle plantarflexion                 Ankle inversion                 Ankle eversion                  (Blank rows = not tested)  LOWER EXTREMITY MMT:  MMT Right eval Left eval Right 12/15/22 Left 12/15/22 Left 02/03/23 Right 02/03/23  Hip flexion 3+ 4+ 4+ 4+    Hip extension        Hip abduction        Hip adduction        Hip internal rotation        Hip external rotation        Knee flexion 4 4 4- 4-  4/5  Knee extension 2 4 4 5  HH dynameter 58# & 68.1# HH dynameter 41.4# & 47.7# RLE:LLE 71%  Ankle dorsiflexion 5 5      Ankle plantarflexion        Ankle inversion        Ankle eversion         (Blank rows = not tested)    FUNCTIONAL TESTS:  02/01/2023: 5 times sit to stand: 12.9 seconds no UEs, severe offshift from R LE/compensation Timed Up & GO 10.03 sec no device  12/15/2022: 5 times sit to stand: 15.5 seconds no UEs, severe offshift from R LE/compensations Timed up and go (TUG): 14.7 seconds SPC   3 minute walk test: 540ft SPC    11/20/2022 5 times sit to stand: 43 seconds no UEs; Timed up and go (TUG): 19.5 seconds SPC;   3 minute walk test: 367ft SPC;   GAIT: 02/01/2023: 3-Minute Walk Test 566' no device Gait Velocity: 3.38 ft/sec Stairs with right rail alternating pattern modified independent; limited knee flexion results descending with right hip rise during stance  Pt neg ramp & curb without device modified independent (increased time)   01/20/2023:  Ambulation c FWW with maintained knee flexion in stance, decreased Rt knee flexion in swing with mild hip circumduction noted.   12/23/2022: pt amb with cane with antalgic pattern with knee flexion in stance  and minimal increase flexion for swing.   11/20/2022 / Evaluation: Distance walked: 344ft Assistive device utilized: Single point cane Level of assistance: Modified independence Comments: slow but steady, able to make sharp turns well at each end of walking course, used SPC on wrong side, foot flat and  limited heel-toe pattern, limited R knee flexion/extension in stance/swing phases                     TODAY'S TREATMENT:                                                                                              DATE: 02/03/2023: Therapeutic Exercise: Nustep BLEs/BUEs for range seat 7, then 6, then 5 for 3 mins each; first minute of each slow stretch with 5 sec hold flex & ext, then 2 min walking pace with full range of machine setting for flexion & extension. Supine quad set with heel propped 5 sec hold 20 reps See ROM & strength testing in objective data   TREATMENT:                                                                                              DATE: 02/01/2023: Therapeutic Exercise: PT reviewed Medicare requirement of progress to cover further PT.  PT recommending ongoing HEP & [employer] to address range, muscle endurance, flexibility (both joint & muscles crossing knee) and balance. Pt verbalized understanding.  Nustep BLEs/BUEs for range seat 7, then 6, then 5 for 3 mins each; first minute of each slow stretch with 5 sec hold flex & ext, then 2 min walking pace with full range of machine setting for flexion & extension. Knee ext machine BLEs 20# 15 reps;  BLE concentric, RLE only isometric / eccentric 10# 10 reps 2 sets; RLE only concentric & eccentric 5 # 10 reps. Knee flexion machine BLEs 35# 15 reps, right leg only 15# 10 reps 2 sets. Leg press BLEs 100# 15 reps; RLE only 50# 10 reps 2 sets.  PT instructed in recommendations for above 3 machines in addition to stepper & bike at [employer]. Pt verbalized understanding.  Therapeutic Activities: See objective for functional activities & gait testing.   Vasopneumatic (per Pt request) 10 mins 34 deg medium compression in elevation Rt knee   TREATMENT:  DATE: 01/27/2023: Therapeutic Exercise: Leg press BLEs 100# 15 reps;  RLE only 50# 10 reps 2 sets.  Seated with LLE in 2nd chair ext 5# hang 1 min then hamstring stretch with strap 30 sec, followed by flexion stretch with left foot on edge of 2nd chair scooting buttocks closer 1 min; 2 sets. Gastroc stretch on step with heel depression 30 sec hold 3 reps Heel raise BLEs without UE support 10 reps Squat lift 10# kettle bell with pelvis over chair to facilitate more knee flexion 10 reps. PT demo & verbal cues. Single knee to chest stretch 15 sec hold 2 reps ea Trunk rotation stretch supine 15 sec hold 2 reps ea Quad stretch supine hooklying with strap knee flexion 30 sec 3 reps Seated in chair with sheet tied on right ankle assisted knee flexion 10 sec hold 10 reps.  Pt amb in clinic bw areas without device.  Manual Therapy: Seated Rt knee flexion c distraction/IR mobilization c movement, contract/relax for flexion gains.  Vasopneumatic (per Pt request) 10 mins 34 deg medium compression in elevation Rt knee     HOME EXERCISE PROGRAM: Access Code: [email or web address] URL: [email or web address] Date: 01/19/2023 Prepared by: Vladimir Faster  Exercises - Ankle Alphabet in Elevation  - 2-4 x daily - 7 x weekly - 1 sets - 1 reps - Supine Single Leg Ankle Pumps  - 1 x daily - 5 x weekly - 1 sets - 10 reps - 5 seconds hold - Quad Setting and Stretching  - 2-4 x daily - 7 x weekly - 5-10 sets - 10 reps - 5 sec hold - Supine Heel Slide with Strap  - 2-3 x daily - 7 x weekly - 2-3 sets - 10 reps - 5 seconds hold - Supine Knee Extension Strengthening  - 1 x daily - 7 x weekly - 2-3 sets - 10 reps - 5 seconds hold - Supine Straight Leg Raises  - 2-3 x daily - 7 x weekly - 2-3 sets - 10 reps - 5 seconds hold - Seated Knee Flexion Extension AROM   - 2-4 x daily - 7 x weekly - 2-3 sets - 10 reps - 5 seconds hold - Seated straight leg lifts  - 2-3 x daily - 7 x weekly - 2-3 sets - 10 reps - 5 seconds hold - Seated Hamstring Stretch with Strap  - 2-4 x daily - 7 x  weekly - 1 sets - 3 reps - 20-30 seconds hold - Seated Long Arc Quad  - 1 x daily - 7 x weekly - 2-3 sets - 10 reps - 5 seconds hold - Heel raises near counter  - 1 x daily - 5 x weekly - 1 sets - 10 reps - 5 seconds hold - standing calf stretch with forefoot on small step or brick  - 1 x daily - 7 x weekly - 1 sets - 3 reps - 20-30 seconds hold - Standing Knee Flexion with Counter Support  - 1 x daily - 7 x weekly - 12-3 sets - 10 reps - 5 seconds hold - Standing March with Counter Support  - 1 x daily - 7 x weekly - 2-3 sets - 10 reps - 5 seconds hold - standing knee extension  - 1 x daily - 7 x weekly - 2-3 sets - 10 reps - 5 seconds hold - Gastroc Stretch on Step  - 2 x daily - 7 x weekly - 1 sets - 3 reps - 30 seconds  hold - Seated Knee Extension Stretch with Chair  - 3 x daily - 7 x weekly - 1 sets - 2-3 reps - 1 minute hold - Seated Hamstring Stretch with Chair  - 2 x daily - 7 x weekly - 1 sets - 3 reps - 30 seconds hold - Supine Quadriceps Stretch with Strap on Table  - 2 x daily - 7 x weekly - 1 sets - 3 reps - 30 seconds hold   ASSESSMENT: CLINICAL IMPRESSION: Patient has plateaued in progress with range. She appears to have fibrous tissue in knee with resulting limitations.   Pt appears to understand instruction in exercises at [employer] for range given which pt appears to understand.  Timed Up & Go, 5 times sit to stand and gait LTGs met.  OBJECTIVE IMPAIRMENTS: Abnormal gait, decreased activity tolerance, decreased balance, decreased coordination, decreased mobility, difficulty walking, decreased ROM, decreased strength, hypomobility, increased edema, increased fascial restrictions, impaired flexibility, impaired UE functional use, and pain.   ACTIVITY LIMITATIONS: sitting, standing, squatting, stairs, transfers, bed mobility, and locomotion level  PARTICIPATION LIMITATIONS: cleaning, laundry, driving, shopping, community activity, occupation, and yard work  PERSONAL  FACTORS: Age, Behavior pattern, Education, Fitness, Past/current experiences, Social background, and Time since onset of injury/illness/exacerbation are also affecting patient's functional outcome.   REHAB POTENTIAL: Good  CLINICAL DECISION MAKING: Stable/uncomplicated  EVALUATION COMPLEXITY: Low   GOALS: Goals reviewed with patient? Yes  SHORT TERM GOALS: Target date: 01/15/2023   Will be compliant with appropriate progressive HEP  Baseline: see objective data Goal status: MET 01/13/2023  2.  R knee PROM ext -5* and flexion 100* Baseline: see objective data Goal status: not met 01/13/2023  3.  Will be independent with management techniques for localized edema including wearing compression stockings and appropriate ice routine  Baseline: see objective data Goal status: MET 01/06/2023  4.  Patient ambulates with cane with knee ext in stance & flexion in swing within 75% of normal range. Baseline: see objective data Goal status: not met 01/13/2023   LONG TERM GOALS: Target date: 02/04/2023    MMT right knee >80% of LLE with HH dynameter Baseline: see objective data Goal status: partially met 02/03/2023 improved to 71%  2.  R knee flexion AROM to be at least 105 degrees  Baseline: see objective data Goal status: not met 02/03/2023  3.  Will be able to ascend/descend steps with single rail and reciprocal pattern, no increase in pain  Baseline: see objective data Goal status: MET 02/01/2023  4.  Will complete 5x sit to stand test in 15 seconds or less no UEs  Baseline: see objective data Goal status: MET 02/01/2023  5.  Will complete TUG test in 13 seconds or less with LRAD to show improved functional balance Baseline: see objective data Goal status: MET 02/01/2023  6.  Will ambulate > 580ft with cane & neg ramp / curb modified independent to show improved mobility and community access  Baseline: see objective data Goal status:  MET 02/01/2023   PLAN:  PT FREQUENCY:  2-5x/wk (daily when clinic open for 2 weeks post manipulation)  PT DURATION: 6 weeks  PLANNED INTERVENTIONS: Therapeutic exercises, Therapeutic activity, Neuromuscular re-education, Balance training, Gait training, Patient/Family education, Self Care, Joint mobilization, Stair training, Orthotic/Fit training, DME instructions, Aquatic Therapy, Dry Needling, Electrical stimulation, Cryotherapy, Moist heat, Taping, Vasopneumatic device, Ultrasound, Ionotophoresis 4mg /ml Dexamethasone, Manual therapy, and Re-evaluation  PLAN FOR NEXT SESSION:  discharge    Vladimir Faster, PT, DPT 02/03/2023, 10:57  AM

## 2023-02-18 ENCOUNTER — Other Ambulatory Visit (INDEPENDENT_AMBULATORY_CARE_PROVIDER_SITE_OTHER): Payer: Medicare Other

## 2023-02-18 ENCOUNTER — Ambulatory Visit (INDEPENDENT_AMBULATORY_CARE_PROVIDER_SITE_OTHER): Payer: Medicare Other | Admitting: Orthopaedic Surgery

## 2023-02-18 ENCOUNTER — Telehealth: Payer: Self-pay | Admitting: *Deleted

## 2023-02-18 ENCOUNTER — Encounter: Payer: Self-pay | Admitting: Orthopaedic Surgery

## 2023-02-18 DIAGNOSIS — Z96651 Presence of right artificial knee joint: Secondary | ICD-10-CM

## 2023-02-18 MED ORDER — GABAPENTIN 300 MG PO CAPS: 300.0000 mg | ORAL_CAPSULE | Freq: Two times a day (BID) | ORAL | 1 refills | Status: DC

## 2023-02-18 NOTE — Progress Notes (Signed)
The patient is now just over 3 months status post a right total knee arthroplasty.  She has had a really tough postoperative course with pain.  We had to actually take her to the operating room after 6 weeks and perform manipulation under anesthesia.  The knee is still very sensitive to her.  She has been to therapy and using a cane as well.  She has moderate swelling of the right knee today and lacks full extension by at least 5 degrees or more.  Her flexion is getting past 90 degrees she is less sensitive over scar just recently.  She is unfortunately on Xarelto and cannot take anti-inflammatories.  She was diagnosed with A-fib.  2 views of the right knee show well-seated total knee arthroplasty with no complicating features.  At this point I would like to try Neurontin 300 mg that she will take at night for the next 5 days and then increase this to twice a day if she is tolerating it well.  I think this will help with some of her pain.  She can place Voltaren gel around her knee at least twice daily.  This will be a safer profile for her from an anti-inflammatory standpoint since she cannot take oral anti-inflammatories.  Hopefully between this and time her symptoms will improve.  I would like to see her back in a month to see how she is doing overall but no x-rays are needed.

## 2023-02-18 NOTE — Telephone Encounter (Signed)
Ortho bundle 90 day in office meeting completed.

## 2023-02-19 ENCOUNTER — Ambulatory Visit (HOSPITAL_COMMUNITY)
Admission: RE | Admit: 2023-02-19 | Discharge: 2023-02-19 | Disposition: A | Discharge status: Home / Self Care | Payer: Medicare Other | Source: Ambulatory Visit | Attending: Internal Medicine | Admitting: Internal Medicine

## 2023-02-19 ENCOUNTER — Encounter (HOSPITAL_COMMUNITY): Payer: Self-pay | Admitting: Internal Medicine

## 2023-02-19 VITALS — BP 118/76 | HR 67 | Ht 65.0 in | Wt 193.6 lb

## 2023-02-19 DIAGNOSIS — I509 Heart failure, unspecified: Secondary | ICD-10-CM | POA: Insufficient documentation

## 2023-02-19 DIAGNOSIS — E669 Obesity, unspecified: Secondary | ICD-10-CM | POA: Diagnosis not present

## 2023-02-19 DIAGNOSIS — I4819 Other persistent atrial fibrillation: Secondary | ICD-10-CM

## 2023-02-19 DIAGNOSIS — I110 Hypertensive heart disease with heart failure: Secondary | ICD-10-CM | POA: Insufficient documentation

## 2023-02-19 DIAGNOSIS — D6869 Other thrombophilia: Secondary | ICD-10-CM | POA: Diagnosis not present

## 2023-02-19 DIAGNOSIS — Z7901 Long term (current) use of anticoagulants: Secondary | ICD-10-CM | POA: Insufficient documentation

## 2023-02-19 DIAGNOSIS — Z6832 Body mass index (BMI) 32.0-32.9, adult: Secondary | ICD-10-CM | POA: Diagnosis not present

## 2023-02-19 NOTE — Progress Notes (Signed)
Primary Care Physician: Merri Brunette, MD Primary Cardiologist: Nanetta Batty, MD Electrophysiologist: None     Referring Physician: Gillian Shields, NP     Melissa Maldonado is a 82 y.o. female with a history of HTN, obesity, and persistent atrial fibrillation who presents for consultation in the [facility].  The patient was initially diagnosed with atrial fibrillation at time of right knee surgery. She does not appear to have cardiac awareness of Afib. Patient is on Xarelto 15 mg for a CHADS2VASC score of 4.  On evaluation today, she is currently in rate controlled Afib. She has no cardiac awareness of Afib. Husband notes she snores and makes abnormal sounds while asleep sometimes. She has not missed any doses of Xarelto.  On follow up 02/19/23, she is currently in NSR. She admits that overall she does not feel that different compared to before cardioversion. S/p successful DCCV on 01/29/23 with conversion to NSR x 3 shocks. She has not missed any doses of Xarelto 15 mg. She is postponing sleep study until her knee is improved.   Today, she denies symptoms of palpitations, chest pain, shortness of breath, orthopnea, PND, lower extremity edema, dizziness, presyncope, syncope, snoring, daytime somnolence, bleeding, or neurologic sequela. The patient is tolerating medications without difficulties and is otherwise without complaint today.    Atrial Fibrillation Risk Factors:  she does have symptoms or diagnosis of sleep apnea.   she has a BMI of Body mass index is 32.22 kg/m.Marland Kitchen Filed Weights   02/19/23 0959  Weight: 87.8 kg     Current Outpatient Medications  Medication Sig Dispense Refill   acetaminophen (TYLENOL) 500 MG tablet Take 1,000 mg by mouth every 6 (six) hours as needed for moderate pain or mild pain.     amLODipine (NORVASC) 5 MG tablet Take 5 mg by mouth daily.     dorzolamide (TRUSOPT) 2 % ophthalmic solution Place 1 drop into the left eye 2  (two) times daily.     gabapentin (NEURONTIN) 300 MG capsule Take 1 capsule (300 mg total) by mouth 2 (two) times daily. Take at bedtime for 5 days and then increase to twice daily 60 capsule 1   Rivaroxaban (XARELTO) 15 MG TABS tablet Take 1 tablet (15 mg total) by mouth daily with supper. Take 1 st dose 11/07/22 at 1700 hrs. 90 tablet 3   rOPINIRole (REQUIP) 0.5 MG tablet Take 0.5 mg by mouth at bedtime. 1-3 hours before bedtime     Cholecalciferol (VITAMIN D) 50 MCG (2000 UT) tablet Take 2,000 Units by mouth daily. (Patient not taking: Reported on 02/19/2023)     oxyCODONE (OXY IR/ROXICODONE) 5 MG immediate release tablet Take 1-2 tablets (5-10 mg total) by mouth every 6 (six) hours as needed for moderate pain (pain score 4-6). (Patient not taking: Reported on 02/19/2023) 30 tablet 0   tiZANidine (ZANAFLEX) 4 MG tablet Take 1 tablet (4 mg total) by mouth every 8 (eight) hours as needed for muscle spasms. (Patient not taking: Reported on 02/19/2023) 60 tablet 0   No current facility-administered medications for this encounter.    Atrial Fibrillation Management history:  Previous antiarrhythmic drugs: None Previous cardioversions: 01/29/23 Previous ablations: None Anticoagulation history: Xarelto 15 mg   ROS- All systems are reviewed and negative except as per the HPI above.  Physical Exam: BP 118/76   Pulse 67   Ht 5\' 5"  (1.651 m)   Wt 87.8 kg   BMI 32.22 kg/m   GEN- The  patient is well appearing, alert and oriented x 3 today.   Neck - no JVD or carotid bruit noted Lungs- Clear to ausculation bilaterally, normal work of breathing Heart- Regular rate and rhythm, no murmurs, rubs or gallops, PMI not laterally displaced Extremities- no clubbing, cyanosis, or edema Skin - no rash or ecchymosis noted   EKG today demonstrates  Vent. rate 67 BPM PR interval 192 ms QRS duration 78 ms QT/QTcB 402/424 ms P-R-T axes 65 -25 57 Normal sinus rhythm Low voltage QRS Cannot rule out Anterior  infarct , age undetermined Abnormal ECG When compared with ECG of 29-Jan-2023 11:19, PREVIOUS ECG IS PRESENT  Echo 09/28/22 demonstrated   1. Left ventricular ejection fraction, by estimation, is 60 to 65%. The  left ventricle has normal function. The left ventricle has no regional  wall motion abnormalities. Left ventricular diastolic function could not  be evaluated.   2. Right ventricular systolic function is normal. The right ventricular  size is normal. There is normal pulmonary artery systolic pressure.   3. Right atrial size was mildly dilated.   4. The mitral valve is normal in structure. Trivial mitral valve  regurgitation. No evidence of mitral stenosis.   5. The aortic valve is tricuspid. Aortic valve regurgitation is not  visualized. No aortic stenosis is present.   6. The inferior vena cava is normal in size with greater than 50%  respiratory variability, suggesting right atrial pressure of 3 mmHg.   Cardiac monitor 2/23-09/19/22: Atrial Fibrillation occurred continuously (100% burden), ranging from 44-185 bpm (avg of 86 bpm). 1 Pause occurred lasting 4.3 secs (14 bpm). Isolated VEs were rare (<1.0%), VE Couplets were rare (<1.0%), and no VE Triplets were present.   1. AFIB 100% 2. Tachy/Brady (44-185) 3. One pause 4.3 seconds   ASSESSMENT & PLAN CHA2DS2-VASc Score = 4  The patient's score is based upon: CHF History: 0 HTN History: 1 Diabetes History: 0 Stroke History: 0 Vascular Disease History: 0 Age Score: 2 Gender Score: 1      ASSESSMENT AND PLAN: Persistent Atrial Fibrillation (ICD10:  I48.19) The patient's CHA2DS2-VASc score is 4, indicating a 4.8% annual risk of stroke.   S/p successful DCCV on 01/29/23.   She is currently in NSR. Emphasis placed on rhythm monitoring device which she has not yet purchased. She states will do so to monitor rhythm at home. Continue conservative observation for now.     Secondary Hypercoagulable State (ICD10:   D68.69) The patient is at significant risk for stroke/thromboembolism based upon her CHA2DS2-VASc Score of 4.  Continue Rivaroxaban (Xarelto).  No missed doses.    Follow up 3 months Afib clinic.    Lake Bells, PA-C  Afib Clinic [facility] [address] [place], [place] [address] [phone]

## 2023-02-23 ENCOUNTER — Encounter (HOSPITAL_BASED_OUTPATIENT_CLINIC_OR_DEPARTMENT_OTHER): Payer: Self-pay

## 2023-02-23 ENCOUNTER — Telehealth: Payer: Self-pay | Admitting: *Deleted

## 2023-02-23 NOTE — Telephone Encounter (Signed)
Patient called and asked if there was any contraindication to returning to regular massage therapy. She is over 3 months post op from her Right total knee replacement. I told her there shouldn't be, but wanted to check with you also. Thank you.

## 2023-03-03 ENCOUNTER — Ambulatory Visit: Payer: Medicare Other | Admitting: Orthopaedic Surgery

## 2023-03-03 DIAGNOSIS — N1832 Chronic kidney disease, stage 3b: Secondary | ICD-10-CM | POA: Diagnosis not present

## 2023-03-10 DIAGNOSIS — N2581 Secondary hyperparathyroidism of renal origin: Secondary | ICD-10-CM | POA: Diagnosis not present

## 2023-03-10 DIAGNOSIS — I129 Hypertensive chronic kidney disease with stage 1 through stage 4 chronic kidney disease, or unspecified chronic kidney disease: Secondary | ICD-10-CM | POA: Diagnosis not present

## 2023-03-10 DIAGNOSIS — N1832 Chronic kidney disease, stage 3b: Secondary | ICD-10-CM | POA: Diagnosis not present

## 2023-03-10 DIAGNOSIS — D631 Anemia in chronic kidney disease: Secondary | ICD-10-CM | POA: Diagnosis not present

## 2023-03-10 DIAGNOSIS — I4891 Unspecified atrial fibrillation: Secondary | ICD-10-CM | POA: Diagnosis not present

## 2023-03-18 ENCOUNTER — Encounter: Payer: Self-pay | Admitting: Orthopaedic Surgery

## 2023-03-18 ENCOUNTER — Ambulatory Visit (INDEPENDENT_AMBULATORY_CARE_PROVIDER_SITE_OTHER): Payer: Medicare Other | Admitting: Orthopaedic Surgery

## 2023-03-18 DIAGNOSIS — Z96651 Presence of right artificial knee joint: Secondary | ICD-10-CM | POA: Diagnosis not present

## 2023-03-18 NOTE — Progress Notes (Signed)
The patient is now 4 months status post a right total knee arthroplasty.  Her postoperative course has been complicated by pain and arthrofibrosis.  She has been through extensive physical therapy.  We had to perform an immunization under anesthesia as well.  On exam she still lacks full extension by at least 3 to 5 degrees.  I can only flex her to maybe 90 degrees.  There is swelling with the knee as well.  At this point I would hold off on any other interventions such as an open arthrotomy with lysis of adhesions and a polyliner exchange.  I have recommended that she buy a knee extension device such as the extensionator which she can get online and can hold her knee and a more extended position.  This could certainly help and decrease swelling and time can help.  The only other option would be again a surgical intervention.  From my standpoint we should hold off on this until her swelling decreases.  The Neurontin we tried has not worked.  I would like to see her back then in 6 months with a repeat standing AP and lateral of her right knee and then go from there.

## 2023-03-19 DIAGNOSIS — Z1231 Encounter for screening mammogram for malignant neoplasm of breast: Secondary | ICD-10-CM | POA: Diagnosis not present

## 2023-03-23 DIAGNOSIS — J45909 Unspecified asthma, uncomplicated: Secondary | ICD-10-CM | POA: Diagnosis not present

## 2023-03-23 DIAGNOSIS — I10 Essential (primary) hypertension: Secondary | ICD-10-CM | POA: Diagnosis not present

## 2023-03-23 DIAGNOSIS — R600 Localized edema: Secondary | ICD-10-CM | POA: Diagnosis not present

## 2023-04-01 DIAGNOSIS — I10 Essential (primary) hypertension: Secondary | ICD-10-CM | POA: Diagnosis not present

## 2023-04-01 DIAGNOSIS — N189 Chronic kidney disease, unspecified: Secondary | ICD-10-CM | POA: Diagnosis not present

## 2023-04-01 DIAGNOSIS — N183 Chronic kidney disease, stage 3 unspecified: Secondary | ICD-10-CM | POA: Diagnosis not present

## 2023-04-01 DIAGNOSIS — J309 Allergic rhinitis, unspecified: Secondary | ICD-10-CM | POA: Diagnosis not present

## 2023-04-19 DIAGNOSIS — I10 Essential (primary) hypertension: Secondary | ICD-10-CM | POA: Diagnosis not present

## 2023-04-19 DIAGNOSIS — G8929 Other chronic pain: Secondary | ICD-10-CM | POA: Diagnosis not present

## 2023-04-19 DIAGNOSIS — M549 Dorsalgia, unspecified: Secondary | ICD-10-CM | POA: Diagnosis not present

## 2023-04-20 DIAGNOSIS — H18513 Endothelial corneal dystrophy, bilateral: Secondary | ICD-10-CM | POA: Diagnosis not present

## 2023-04-20 DIAGNOSIS — H04123 Dry eye syndrome of bilateral lacrimal glands: Secondary | ICD-10-CM | POA: Diagnosis not present

## 2023-04-20 DIAGNOSIS — H40011 Open angle with borderline findings, low risk, right eye: Secondary | ICD-10-CM | POA: Diagnosis not present

## 2023-04-20 DIAGNOSIS — H401221 Low-tension glaucoma, left eye, mild stage: Secondary | ICD-10-CM | POA: Diagnosis not present

## 2023-04-24 ENCOUNTER — Other Ambulatory Visit: Payer: Self-pay | Admitting: Orthopaedic Surgery

## 2023-04-26 DIAGNOSIS — N184 Chronic kidney disease, stage 4 (severe): Secondary | ICD-10-CM | POA: Diagnosis not present

## 2023-04-26 DIAGNOSIS — I10 Essential (primary) hypertension: Secondary | ICD-10-CM | POA: Diagnosis not present

## 2023-05-05 DIAGNOSIS — N184 Chronic kidney disease, stage 4 (severe): Secondary | ICD-10-CM | POA: Diagnosis not present

## 2023-05-05 DIAGNOSIS — E559 Vitamin D deficiency, unspecified: Secondary | ICD-10-CM | POA: Diagnosis not present

## 2023-05-05 DIAGNOSIS — E7800 Pure hypercholesterolemia, unspecified: Secondary | ICD-10-CM | POA: Diagnosis not present

## 2023-05-05 DIAGNOSIS — I10 Essential (primary) hypertension: Secondary | ICD-10-CM | POA: Diagnosis not present

## 2023-05-05 DIAGNOSIS — R600 Localized edema: Secondary | ICD-10-CM | POA: Diagnosis not present

## 2023-05-05 DIAGNOSIS — N390 Urinary tract infection, site not specified: Secondary | ICD-10-CM | POA: Diagnosis not present

## 2023-05-05 DIAGNOSIS — R5383 Other fatigue: Secondary | ICD-10-CM | POA: Diagnosis not present

## 2023-05-06 LAB — LAB REPORT - SCANNED: EGFR: 38

## 2023-05-12 ENCOUNTER — Telehealth: Payer: Self-pay

## 2023-05-12 DIAGNOSIS — J0110 Acute frontal sinusitis, unspecified: Secondary | ICD-10-CM | POA: Diagnosis not present

## 2023-05-12 DIAGNOSIS — R600 Localized edema: Secondary | ICD-10-CM | POA: Diagnosis not present

## 2023-05-12 DIAGNOSIS — I10 Essential (primary) hypertension: Secondary | ICD-10-CM | POA: Diagnosis not present

## 2023-05-12 DIAGNOSIS — N184 Chronic kidney disease, stage 4 (severe): Secondary | ICD-10-CM | POA: Diagnosis not present

## 2023-05-12 NOTE — Patient Outreach (Signed)
Care Coordination   In Person Provider Office Visit Note   05/12/2023 Name: BENNETTE DINICOLA MRN: [id number] DOB: [birth year]  Freddy Finner Vitullo is a 82 y.o. year old female who sees Merri Brunette, MD for primary care. I engaged with Leda Min in the providers office today.  What matters to the patients health and wellness today?  none    Goals Addressed             This Visit's Progress    COMPLETED: Care Coordination Activities-No follow up required       Care Coordination Interventions: Advised patient to Annual Wellness exam. Discussed services and support. Assessed SDOH. Advised to discuss with primary care physician if services needed in the future.        SDOH assessments and interventions completed:  Yes  SDOH Interventions Today    Flowsheet Row Most Recent Value  SDOH Interventions   Housing Interventions Intervention Not Indicated  Transportation Interventions Intervention Not Indicated  Health Literacy Interventions Intervention Not Indicated        Care Coordination Interventions:  Yes, provided   Follow up plan: No further intervention required.   Encounter Outcome:  Patient Visit Completed   Bary Leriche, RN, MSN [facility]  [facility], [facility] Management Community Coordinator Direct Dial: [phone]  Fax: [phone] Website: [email or web address]

## 2023-05-12 NOTE — Patient Instructions (Signed)
Visit Information  Thank you for taking time to visit with me today. Please don't hesitate to contact me if I can be of assistance to you.   Following are the goals we discussed today:   Goals Addressed             This Visit's Progress    COMPLETED: Care Coordination Activities-No follow up required       Care Coordination Interventions: Advised patient to Annual Wellness exam. Discussed services and support. Assessed SDOH. Advised to discuss with primary care physician if services needed in the future.          If you are experiencing a Mental Health or Behavioral Health Crisis or need someone to talk to, please call the Suicide and Crisis Lifeline: 988   Patient verbalizes understanding of instructions and care plan provided today and agrees to view in MyChart. Active MyChart status and patient understanding of how to access instructions and care plan via MyChart confirmed with patient.     The patient has been provided with contact information for the care management team and has been advised to call with any health related questions or concerns.   Bary Leriche, RN, MSN [facility], [facility] Management Community Coordinator Direct Dial: [phone]  Fax: [phone] Website: [email or web address]

## 2023-05-14 DIAGNOSIS — I10 Essential (primary) hypertension: Secondary | ICD-10-CM | POA: Diagnosis not present

## 2023-05-14 DIAGNOSIS — J019 Acute sinusitis, unspecified: Secondary | ICD-10-CM | POA: Diagnosis not present

## 2023-05-14 DIAGNOSIS — Z0000 Encounter for general adult medical examination without abnormal findings: Secondary | ICD-10-CM | POA: Diagnosis not present

## 2023-05-24 ENCOUNTER — Ambulatory Visit (HOSPITAL_COMMUNITY): Payer: Medicare Other | Admitting: Internal Medicine

## 2023-06-02 DIAGNOSIS — Z9889 Other specified postprocedural states: Secondary | ICD-10-CM | POA: Diagnosis not present

## 2023-06-02 DIAGNOSIS — Z6836 Body mass index (BMI) 36.0-36.9, adult: Secondary | ICD-10-CM | POA: Diagnosis not present

## 2023-06-02 DIAGNOSIS — M461 Sacroiliitis, not elsewhere classified: Secondary | ICD-10-CM | POA: Diagnosis not present

## 2023-06-07 ENCOUNTER — Ambulatory Visit (HOSPITAL_COMMUNITY)
Admission: RE | Admit: 2023-06-07 | Discharge: 2023-06-07 | Disposition: A | Discharge status: Home / Self Care | Payer: Medicare Other | Source: Ambulatory Visit | Attending: Internal Medicine | Admitting: Internal Medicine

## 2023-06-07 VITALS — BP 136/56 | HR 48 | Ht 65.0 in | Wt 205.0 lb

## 2023-06-07 DIAGNOSIS — D6869 Other thrombophilia: Secondary | ICD-10-CM | POA: Diagnosis not present

## 2023-06-07 DIAGNOSIS — I4819 Other persistent atrial fibrillation: Secondary | ICD-10-CM | POA: Diagnosis not present

## 2023-06-07 DIAGNOSIS — I10 Essential (primary) hypertension: Secondary | ICD-10-CM | POA: Insufficient documentation

## 2023-06-07 DIAGNOSIS — E669 Obesity, unspecified: Secondary | ICD-10-CM | POA: Insufficient documentation

## 2023-06-07 DIAGNOSIS — Z6834 Body mass index (BMI) 34.0-34.9, adult: Secondary | ICD-10-CM | POA: Diagnosis not present

## 2023-06-07 DIAGNOSIS — Z7901 Long term (current) use of anticoagulants: Secondary | ICD-10-CM | POA: Insufficient documentation

## 2023-06-07 NOTE — Progress Notes (Signed)
Primary Care Physician: Merri Brunette, MD Primary Cardiologist: Nanetta Batty, MD Electrophysiologist: None     Referring Physician: Gillian Shields, NP     Melissa Maldonado is a 82 y.o. female with a history of HTN, obesity, and persistent atrial fibrillation who presents for consultation in the [facility] Fibrillation [facility].  The patient was initially diagnosed with atrial fibrillation at time of right knee surgery. She does not appear to have cardiac awareness of Afib. Patient is on Xarelto 15 mg for a CHADS2VASC score of 4.  On evaluation today, she is currently in rate controlled Afib. She has no cardiac awareness of Afib. Husband notes she snores and makes abnormal sounds while asleep sometimes. She has not missed any doses of Xarelto.  On follow up 02/19/23, she is currently in NSR. She admits that overall she does not feel that different compared to before cardioversion. S/p successful DCCV on 01/29/23 with conversion to NSR x 3 shocks. She has not missed any doses of Xarelto 15 mg. She is postponing sleep study until her knee is improved.   On follow up 06/07/23, she is currently in NSR. Since last office visit, she has had no episodes of Afib. She did purchase a [employer] device and brought it to show me. No bleeding issues on Xarelto.   Today, she denies symptoms of palpitations, chest pain, shortness of breath, orthopnea, PND, lower extremity edema, dizziness, presyncope, syncope, snoring, daytime somnolence, bleeding, or neurologic sequela. The patient is tolerating medications without difficulties and is otherwise without complaint today.    Atrial Fibrillation Risk Factors:  she does have symptoms or diagnosis of sleep apnea.   she has a BMI of Body mass index is 34.11 kg/m.Marland Kitchen Filed Weights   06/07/23 0926  Weight: 93 kg      Current Outpatient Medications  Medication Sig Dispense Refill   acetaminophen (TYLENOL) 500 MG tablet Take 1,000 mg by mouth  every 6 (six) hours as needed for moderate pain or mild pain.     amLODipine (NORVASC) 5 MG tablet Take 5 mg by mouth daily.     Cholecalciferol (VITAMIN D) 50 MCG (2000 UT) tablet Take 2,000 Units by mouth daily.     dorzolamide (TRUSOPT) 2 % ophthalmic solution Place 1 drop into the left eye 2 (two) times daily.     hydrALAZINE (APRESOLINE) 25 MG tablet 1 tablet with food Orally Twice a day     Rivaroxaban (XARELTO) 15 MG TABS tablet Take 1 tablet (15 mg total) by mouth daily with supper. Take 1 st dose 11/07/22 at 1700 hrs. 90 tablet 3   rOPINIRole (REQUIP) 0.5 MG tablet Take 0.5 mg by mouth at bedtime. 1-3 hours before bedtime     telmisartan (MICARDIS) 40 MG tablet Take 40 mg by mouth daily.     tiZANidine (ZANAFLEX) 4 MG tablet TAKE 1 TABLET (4 MG TOTAL) BY MOUTH EVERY 8 (EIGHT) HOURS AS NEEDED FOR MUSCLE SPASMS 60 tablet 0   No current facility-administered medications for this encounter.    Atrial Fibrillation Management history:  Previous antiarrhythmic drugs: None Previous cardioversions: 01/29/23 Previous ablations: None Anticoagulation history: Xarelto 15 mg   ROS- All systems are reviewed and negative except as per the HPI above.  Physical Exam: BP (!) 136/56   Pulse (!) 48   Ht 5\' 5"  (1.651 m)   Wt 93 kg   BMI 34.11 kg/m   GEN- The patient is well appearing, alert and oriented x 3 today.  Neck - no JVD or carotid bruit noted Lungs- Clear to ausculation bilaterally, normal work of breathing Heart- Regular bradycardic rate and rhythm, no murmurs, rubs or gallops, PMI not laterally displaced Extremities- no clubbing, cyanosis, or edema Skin - no rash or ecchymosis noted   EKG today demonstrates  Vent. rate 48 BPM PR interval 172 ms QRS duration 80 ms QT/QTcB 430/384 ms P-R-T axes 84 0 62 Sinus bradycardia with marked sinus arrhythmia Low voltage QRS Cannot rule out Anterior infarct , age undetermined Abnormal ECG When compared with ECG of 19-Feb-2023  10:07, PREVIOUS ECG IS PRESENT  Echo 09/28/22 demonstrated   1. Left ventricular ejection fraction, by estimation, is 60 to 65%. The  left ventricle has normal function. The left ventricle has no regional  wall motion abnormalities. Left ventricular diastolic function could not  be evaluated.   2. Right ventricular systolic function is normal. The right ventricular  size is normal. There is normal pulmonary artery systolic pressure.   3. Right atrial size was mildly dilated.   4. The mitral valve is normal in structure. Trivial mitral valve  regurgitation. No evidence of mitral stenosis.   5. The aortic valve is tricuspid. Aortic valve regurgitation is not  visualized. No aortic stenosis is present.   6. The inferior vena cava is normal in size with greater than 50%  respiratory variability, suggesting right atrial pressure of 3 mmHg.   Cardiac monitor 2/23-09/19/22: Atrial Fibrillation occurred continuously (100% burden), ranging from 44-185 bpm (avg of 86 bpm). 1 Pause occurred lasting 4.3 secs (14 bpm). Isolated VEs were rare (<1.0%), VE Couplets were rare (<1.0%), and no VE Triplets were present.   1. AFIB 100% 2. Tachy/Brady (44-185) 3. One pause 4.3 seconds   ASSESSMENT & PLAN CHA2DS2-VASc Score = 4  The patient's score is based upon: CHF History: 0 HTN History: 1 Diabetes History: 0 Stroke History: 0 Vascular Disease History: 0 Age Score: 2 Gender Score: 1      ASSESSMENT AND PLAN: Persistent Atrial Fibrillation (ICD10:  I48.19) The patient's CHA2DS2-VASc score is 4, indicating a 4.8% annual risk of stroke.   S/p successful DCCV on 01/29/23.   She is currently in NSR. Continue using rhythm monitoring device at home. Continue conservative observation for now.     Secondary Hypercoagulable State (ICD10:  D68.69) The patient is at significant risk for stroke/thromboembolism based upon her CHA2DS2-VASc Score of 4.  Continue Rivaroxaban (Xarelto).  No missed  doses.    Follow up 6 months Afib clinic.    Lake Bells, PA-C  Afib Clinic [facility] [address] [place], [place] [address] [phone]

## 2023-06-22 DIAGNOSIS — M461 Sacroiliitis, not elsewhere classified: Secondary | ICD-10-CM | POA: Diagnosis not present

## 2023-07-12 ENCOUNTER — Encounter (HOSPITAL_BASED_OUTPATIENT_CLINIC_OR_DEPARTMENT_OTHER): Payer: Self-pay | Admitting: Family

## 2023-07-12 ENCOUNTER — Ambulatory Visit (INDEPENDENT_AMBULATORY_CARE_PROVIDER_SITE_OTHER): Payer: Medicare Other | Admitting: Family

## 2023-07-12 VITALS — BP 126/56 | HR 50 | Ht 65.0 in | Wt 202.6 lb

## 2023-07-12 DIAGNOSIS — I480 Paroxysmal atrial fibrillation: Secondary | ICD-10-CM | POA: Diagnosis not present

## 2023-07-12 DIAGNOSIS — R0609 Other forms of dyspnea: Secondary | ICD-10-CM

## 2023-07-12 DIAGNOSIS — I10 Essential (primary) hypertension: Secondary | ICD-10-CM | POA: Diagnosis not present

## 2023-07-12 DIAGNOSIS — Z6833 Body mass index (BMI) 33.0-33.9, adult: Secondary | ICD-10-CM | POA: Diagnosis not present

## 2023-07-12 DIAGNOSIS — D6859 Other primary thrombophilia: Secondary | ICD-10-CM

## 2023-07-12 NOTE — Progress Notes (Signed)
Cardiology Office Note:  .   Date:  07/12/2023  ID:  Melissa Maldonado, DOB 05-03-1941, MRN [id number] PCP: Merri Brunette, MD  [facility] Providers Cardiologist:  Nanetta Batty, MD    History of Present Illness: .   Melissa Maldonado is a 82 y.o. female  with a hx of hypertension, obesity, paroxysmal atrial fibrillation.    Initially established with Dr. Allyson Sabal for exertional dyspnea. Cardiac workup 01/2020 with coronary calcium score of 0. Echo 01/2020 normal LVEF 60-65%, no RWMA, mild LVH, gr2DD, mildly elevated PASP, trivial MR/AR.   Initially diagnosed with atrial fibrillation 08/2022 at time of right knee surgery.  She has since followed routinely with atrial fibrillation clinic.  She underwent successful cardioversion 01/29/2023.    She presents today independently. Pleasant lady from [place].  She notes 2 weeks ago she was walking some distance in the cold into her 4 year old granddaughter's recital when she felt short of breath. Notes intermittent wheezing which has been ongoing. No cough, edema, orthopnea, PND, palpitations, chest pain. 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 Reports compliance with [facility]. No hematuria, melena. She has started going to the gym because of her right knee. Notes she has not been for th elast 2 weeks. Notes she only feels more short of breath at home if she is on the steps or walking prolonged distance.  ROS: Please see the history of present illness.    All other systems reviewed and are negative.   Studies Reviewed: .        Cardiac Studies & Procedures      ECHOCARDIOGRAM  ECHOCARDIOGRAM COMPLETE 09/28/2022  Narrative ECHOCARDIOGRAM REPORT    Patient Name:   Melissa Maldonado Date of Exam: 09/28/2022 Medical Rec #:  [id number]      Height:       65.0 in Accession #:    [id number]     Weight:       197.0 lb Date of Birth:  1941/02/28      BSA:          1.965 m Patient Age:    65 years       BP:           134/70 mmHg Patient Gender: F               HR:           81 bpm. Exam Location:  [address]  Procedure: 2D Echo, Cardiac Doppler and Color Doppler  Indications:    I48.91 Atrial Fibrillation  History:        Patient has prior history of Echocardiogram examinations, most recent 02/07/2020. Signs/Symptoms:Shortness of Breath; Risk Factors:Hypertension and Dyslipidemia.  Sonographer:    Daphine Deutscher RDCS Referring Phys: [id number] Torin Modica S Dalexa Gentz  IMPRESSIONS   1. Left ventricular ejection fraction, by estimation, is 60 to 65%. The left ventricle has normal function. The left ventricle has no regional wall motion abnormalities. Left ventricular diastolic function could not be evaluated. 2. Right ventricular systolic function is normal. The right ventricular size is normal. There is normal pulmonary artery systolic pressure. 3. Right atrial size was mildly dilated. 4. The mitral valve is normal in structure. Trivial mitral valve regurgitation. No evidence of mitral stenosis. 5. The aortic valve is tricuspid. Aortic valve regurgitation is not visualized. No aortic stenosis is present. 6. The inferior vena cava is normal in size with greater than 50% respiratory variability, suggesting right atrial pressure of 3 mmHg.  FINDINGS  Left Ventricle: Left ventricular ejection fraction, by estimation, is 60 to 65%. The left ventricle has normal function. The left ventricle has no regional wall motion abnormalities. The left ventricular internal cavity size was normal in size. There is no left ventricular hypertrophy. Left ventricular diastolic function could not be evaluated due to atrial fibrillation. Left ventricular diastolic function could not be evaluated.  Right Ventricle: The right ventricular size is normal. No increase in right ventricular wall thickness. Right ventricular systolic function is normal. There is normal pulmonary artery systolic pressure. The tricuspid regurgitant velocity is 2.33 m/s, and with an assumed  right atrial pressure of 3 mmHg, the estimated right ventricular systolic pressure is 24.7 mmHg.  Left Atrium: Left atrial size was normal in size.  Right Atrium: Right atrial size was mildly dilated.  Pericardium: There is no evidence of pericardial effusion.  Mitral Valve: The mitral valve is normal in structure. Trivial mitral valve regurgitation. No evidence of mitral valve stenosis.  Tricuspid Valve: The tricuspid valve is normal in structure. Tricuspid valve regurgitation is trivial. No evidence of tricuspid stenosis.  Aortic Valve: The aortic valve is tricuspid. Aortic valve regurgitation is not visualized. No aortic stenosis is present.  Pulmonic Valve: The pulmonic valve was normal in structure. Pulmonic valve regurgitation is trivial. No evidence of pulmonic stenosis.  Aorta: The aortic root is normal in size and structure.  Venous: The inferior vena cava is normal in size with greater than 50% respiratory variability, suggesting right atrial pressure of 3 mmHg.  IAS/Shunts: No atrial level shunt detected by color flow Doppler.   LEFT VENTRICLE PLAX 2D LVIDd:         4.10 cm LVIDs:         2.20 cm LV PW:         1.00 cm LV IVS:        1.00 cm LVOT diam:     1.70 cm LV SV:         38 LV SV Index:   20 LVOT Area:     2.27 cm   RIGHT VENTRICLE RV Basal diam:  3.70 cm RV S prime:     14.07 cm/s TAPSE (M-mode): 2.2 cm  LEFT ATRIUM             Index        RIGHT ATRIUM           Index LA diam:        5.10 cm 2.59 cm/m   RA Area:     18.80 cm LA Vol (A2C):   62.7 ml 31.90 ml/m  RA Volume:   59.80 ml  30.43 ml/m LA Vol (A4C):   48.4 ml 24.63 ml/m LA Biplane Vol: 57.2 ml 29.10 ml/m AORTIC VALVE LVOT Vmax:   93.95 cm/s LVOT Vmean:  61.950 cm/s LVOT VTI:    0.169 m  AORTA Ao Root diam: 2.90 cm Ao Asc diam:  3.20 cm  MV E velocity: 97.07 cm/s  TRICUSPID VALVE TR Peak grad:   21.7 mmHg TR Vmax:        233.00 cm/s  SHUNTS Systemic VTI:  0.17 m Systemic  Diam: 1.70 cm  Chilton Si MD Electronically signed by Chilton Si MD Signature Date/Time: 09/28/2022/11:08:17 PM    Final   MONITORS  LONG TERM MONITOR (3-14 DAYS) 09/29/2022  Narrative Patch Wear Time:  8 days and 0 hours (2024-02-23T16:51:55-0500 to 2024-03-02T17:23:31-0500)  Atrial Fibrillation occurred continuously (100% burden), ranging from 44-185 bpm (avg of 86  bpm). 1 Pause occurred lasting 4.3 secs (14 bpm). Isolated VEs were rare (<1.0%), VE Couplets were rare (<1.0%), and no VE Triplets were present.  AFIB 100% Tachy/Brady (44-185) One pause 4.3 seconds  CT SCANS  CT CARDIAC SCORING (SELF PAY ONLY) 02/07/2020  Addendum 02/07/2020 10:52 AM ADDENDUM REPORT: 02/07/2020 10:50  CLINICAL DATA:  Risk stratification  EXAM: Coronary Calcium Score  TECHNIQUE: The patient was scanned on a [employer] scanner. Axial non-contrast 3 mm slices were carried out through the heart. The data set was analyzed on a dedicated work station and scored using the Agatson method.  FINDINGS: Non-cardiac: See separate report from [facility].  Ascending Aorta: Normal caliber  Pericardium: Normal  Coronary arteries: Normal origin  IMPRESSION: Coronary calcium score of 0.   Electronically Signed By: Chrystie Nose M.D. On: 02/07/2020 10:50  Narrative EXAM: OVER-READ INTERPRETATION  CT CHEST  The following report is an over-read performed by radiologist Dr. Trudie Reed of [facility] on 02/07/2020. This over-read does not include interpretation of cardiac or coronary anatomy or pathology. The coronary calcium score interpretation by the cardiologist is attached.  COMPARISON:  None.  FINDINGS: Within the visualized portions of the thorax there are no suspicious appearing pulmonary nodules or masses, there is no acute consolidative airspace disease, no pleural effusions, no pneumothorax and no lymphadenopathy. Visualized portions of  the upper abdomen are unremarkable. There are no aggressive appearing lytic or blastic lesions noted in the visualized portions of the skeleton.  IMPRESSION: No significant incidental noncardiac findings are noted.  Electronically Signed: By: Trudie Reed M.D. On: 02/07/2020 08:28          Risk Assessment/Calculations:    CHA2DS2-VASc Score = 4   This indicates a 4.8% annual risk of stroke. The patient's score is based upon: CHF History: 0 HTN History: 1 Diabetes History: 0 Stroke History: 0 Vascular Disease History: 0 Age Score: 2 Gender Score: 1            Physical Exam:   VS:  BP (!) 126/56   Pulse (!) 50   Ht 5\' 5"  (1.651 m)   Wt 202 lb 9.6 oz (91.9 kg)   SpO2 98%   BMI 33.71 kg/m    Wt Readings from Last 3 Encounters:  07/12/23 202 lb 9.6 oz (91.9 kg)  06/07/23 205 lb (93 kg)  02/19/23 193 lb 9.6 oz (87.8 kg)    GEN: Well nourished, well developed in no acute distress NECK: No JVD; No carotid bruits CARDIAC: bradycardic, RRR, no murmurs, rubs, gallops RESPIRATORY:  Clear to auscultation without rales, wheezing or rhonchi  ABDOMEN: Soft, non-tender, non-distended EXTREMITIES:  No edema; No deformity   ASSESSMENT AND PLAN: .    Dyspnea on exertion - Isolated episode of exertional dyspnea 2 weeks ago walking prolonged distance cold.  Likely etiology deconditioning.  Lung sounds clear on exam.  Update CBC, BNP to rule out volume overload, anemia, infection. If unremarkable, no further work. Encouraged to increase physical activity.  Refer to PREP exercise program.   Paroxysmal atrial fibrillation / Bradycardia - RRR by auscultation. Bradycardic but asymptomatic with no lightheadedness, dizziness. CHA2DS2-VASc Score = 4 [CHF History: 0, HTN History: 1, Diabetes History: 0, Stroke History: 0, Vascular Disease History: 0, Age Score: 2, Gender Score: 1].  Therefore, the patient's annual risk of stroke is 4.8 %.    Creatinine clearance 47  mL/Maldonado, continue Xarelto  15mg  daily.     HTN - BP well controlled. Continue  current antihypertensive regimen Amlodipine 5mg  every day, hydralazine 25 mg twice daily, telmisartan 40 mg daily.  Obesity / BMI 32 - Weight loss via diet and exercise encouraged. Discussed the impact being overweight would have on cardiovascular risk. Refer to PREP exercise program.        Dispo: follow up as scheduled with Dr. Allyson Sabal 08/2023  Signed, Alver Sorrow, NP

## 2023-07-12 NOTE — Patient Instructions (Addendum)
Medication Instructions:  Your physician recommends that you continue on your current medications as directed. Please refer to the Current Medication list given to you today.  *If you need a refill on your cardiac medications before your next appointment, please call your pharmacy*   Lab Work: Your provider recommends the following labs today: CBC / BMP / BNP   If you have labs (blood work) drawn today and your tests are completely normal, you will receive your results only by: MyChart Message (if you have MyChart) OR A paper copy in the mail If you have any lab test that is abnormal or we need to change your treatment, we will call you to review the results.   Follow-Up: At [facility], you and your health needs are our priority.  As part of our continuing mission to provide you with exceptional heart care, we have created designated Provider Care Teams.  These Care Teams include your primary Cardiologist (physician) and Advanced Practice Providers (APPs -  Physician Assistants and Nurse Practitioners) who all work together to provide you with the care you need, when you need it.  We recommend signing up for the patient portal called "MyChart".  Sign up information is provided on this After Visit Summary.  MyChart is used to connect with patients for Virtual Visits (Telemedicine).  Patients are able to view lab/test results, encounter notes, upcoming appointments, etc.  Non-urgent messages can be sent to your provider as well.   To learn more about what you can do with MyChart, go to [email or web address].    Your next appointment:   August 22, 2023 @ 2:00 with Dr. Allyson Sabal

## 2023-07-13 LAB — BASIC METABOLIC PANEL
BUN/Creatinine Ratio: 16 (ref 12–28)
BUN: 28 mg/dL — ABNORMAL HIGH (ref 8–27)
CO2: 23 mmol/L (ref 20–29)
Calcium: 9.2 mg/dL (ref 8.7–10.3)
Chloride: 107 mmol/L — ABNORMAL HIGH (ref 96–106)
Creatinine, Ser: 1.72 mg/dL — ABNORMAL HIGH (ref 0.57–1.00)
Glucose: 67 mg/dL — ABNORMAL LOW (ref 70–99)
Potassium: 4.4 mmol/L (ref 3.5–5.2)
Sodium: 143 mmol/L (ref 134–144)
eGFR: 30 mL/min/{1.73_m2} — ABNORMAL LOW (ref >59)

## 2023-07-13 LAB — CBC
Hematocrit: 39.9 % (ref 34.0–46.6)
Hemoglobin: 12.6 g/dL (ref 11.1–15.9)
MCH: 29.7 pg (ref 26.6–33.0)
MCHC: 31.6 g/dL (ref 31.5–35.7)
MCV: 94 fL (ref 79–97)
Platelets: 219 10*3/uL (ref 150–450)
RBC: 4.24 x10E6/uL (ref 3.77–5.28)
RDW: 13.1 % (ref 11.7–15.4)
WBC: 5.5 10*3/uL (ref 3.4–10.8)

## 2023-07-13 LAB — BRAIN NATRIURETIC PEPTIDE: BNP: 149.9 pg/mL — ABNORMAL HIGH (ref 0.0–100.0)

## 2023-07-15 ENCOUNTER — Telehealth: Payer: Self-pay | Admitting: *Deleted

## 2023-07-15 DIAGNOSIS — R0609 Other forms of dyspnea: Secondary | ICD-10-CM

## 2023-07-15 MED ORDER — FUROSEMIDE 20 MG PO TABS
ORAL_TABLET | ORAL | 3 refills | Status: AC
Start: 2023-07-15 — End: ?

## 2023-07-15 NOTE — Telephone Encounter (Signed)
Spoke with pt, aware of the recommendations New script sent to the pharmacy  Lab orders mailed to the pt

## 2023-07-15 NOTE — Telephone Encounter (Signed)
-----   Message from Alver Sorrow sent at 07/15/2023  7:51 AM EST ----- Normal electrolytes.  Kidney function slightly declined from prior.  BNP with mild volume overload.  CBC no anemia nor infection.  Recommend Lasix 20 mg daily for 3 days then as needed for week and of 2 pounds overnight or 5 pounds in 1 week.  Repeat BMP/BNP in 1 to 2 weeks for monitoring.

## 2023-07-16 ENCOUNTER — Telehealth: Payer: Self-pay | Admitting: *Deleted

## 2023-07-16 NOTE — Telephone Encounter (Signed)
Contacted regarding PREP Class referral. She is interested in participating at the [facility] Class to begin 08/23/2023 every M/W 1300-1415 for 12 weeks.

## 2023-07-20 ENCOUNTER — Ambulatory Visit
Admission: RE | Admit: 2023-07-20 | Discharge: 2023-07-20 | Disposition: A | Discharge status: Home / Self Care | Payer: Medicare Other | Source: Ambulatory Visit | Attending: Internal Medicine | Admitting: Internal Medicine

## 2023-07-20 ENCOUNTER — Other Ambulatory Visit: Payer: Self-pay | Admitting: Internal Medicine

## 2023-07-20 DIAGNOSIS — R600 Localized edema: Secondary | ICD-10-CM | POA: Diagnosis not present

## 2023-07-20 DIAGNOSIS — M62838 Other muscle spasm: Secondary | ICD-10-CM | POA: Diagnosis not present

## 2023-07-20 DIAGNOSIS — R58 Hemorrhage, not elsewhere classified: Secondary | ICD-10-CM | POA: Diagnosis not present

## 2023-08-02 ENCOUNTER — Other Ambulatory Visit: Payer: Self-pay | Admitting: Family

## 2023-08-02 ENCOUNTER — Telehealth: Payer: Self-pay | Admitting: Cardiovascular Disease

## 2023-08-02 DIAGNOSIS — M5416 Radiculopathy, lumbar region: Secondary | ICD-10-CM | POA: Diagnosis not present

## 2023-08-02 DIAGNOSIS — M461 Sacroiliitis, not elsewhere classified: Secondary | ICD-10-CM | POA: Diagnosis not present

## 2023-08-02 DIAGNOSIS — Z9889 Other specified postprocedural states: Secondary | ICD-10-CM | POA: Diagnosis not present

## 2023-08-02 DIAGNOSIS — R0609 Other forms of dyspnea: Secondary | ICD-10-CM

## 2023-08-02 NOTE — Telephone Encounter (Signed)
   Pre-operative Risk Assessment    Patient Name: Melissa Maldonado  DOB: 25-May-1941 MRN: [id number]   Date of last office visit: 07/12/2023 Date of next office visit: 08/23/2023   Request for Surgical Clearance    Procedure:   L5-S1 ESI  Date of Surgery:  Clearance TBD                                Surgeon:  Adina Manus Surgeon's Group or Practice Name:  [facility] Phone number:  [phone] Fax number:  [phone]   Type of Clearance Requested:   - Pharmacy:  Hold Rivaroxaban  (Xarelto ) 3 days resume next day   Type of Anesthesia:  Not Indicated   Additional requests/questions:    Melissa Maldonado   08/02/2023, 3:59 PM

## 2023-08-02 NOTE — Telephone Encounter (Signed)
 Pharmacy please advise on holding Xarelto prior to L5-S1 ESI scheduled for TBD. Saw another provider refilled it but he is on it for atrial fib and saw cardiology on 07/12/2023 told to stay on Xarelto. Thank you.

## 2023-08-03 NOTE — Telephone Encounter (Signed)
   Name: Melissa Maldonado  DOB: Jun 25, 1941  MRN: [id number]   Primary Cardiologist: Dorn Lesches, MD  Chart reviewed as part of pre-operative protocol coverage. Patient was contacted 08/03/2023 in reference to pre-operative risk assessment for pending surgery as outlined below.  Melissa Maldonado was last seen on 07/12/2023 by Reche Finder, NP.  Since that day, Melissa Maldonado has done well from a cardiac standpoint.   Patient with diagnosis of afib on Xarelto  for anticoagulation.     Procedure: L5-S1 ESI  Date of procedure: TBD     CHA2DS2-VASc Score = 4   This indicates a 4.8% annual risk of stroke. The patient's score is based upon: CHF History: 0 HTN History: 1 Diabetes History: 0 Stroke History: 0 Vascular Disease History: 0 Age Score: 2 Gender Score: 1       CrCl 29.9 ml/min Platelet count 219   Per office protocol, patient can hold Xarelto  for 4 days prior to procedure.        Therefore, based on ACC/AHA guidelines, the patient would be at acceptable risk for the planned procedure without further cardiovascular testing.   The patient was advised that if she develops new symptoms prior to surgery to contact our office to arrange for a follow-up visit, and she verbalized understanding.  I will route this recommendation to the requesting party via Epic fax function and remove from pre-op pool. Please call with questions.  Lamarr Satterfield, NP 08/03/2023, 11:39 AM

## 2023-08-03 NOTE — Telephone Encounter (Signed)
 Patient with diagnosis of afib on Xarelto  for anticoagulation.    Procedure: L5-S1 ESI  Date of procedure: TBD   CHA2DS2-VASc Score = 4   This indicates a 4.8% annual risk of stroke. The patient's score is based upon: CHF History: 0 HTN History: 1 Diabetes History: 0 Stroke History: 0 Vascular Disease History: 0 Age Score: 2 Gender Score: 1      CrCl 29.9 ml/min Platelet count 219  Per office protocol, patient can hold Xarelto  for 4 days prior to procedure.     **This guidance is not considered finalized until pre-operative APP has relayed final recommendations.**

## 2023-08-17 ENCOUNTER — Telehealth: Payer: Self-pay | Admitting: *Deleted

## 2023-08-17 NOTE — Telephone Encounter (Signed)
Had left message with her husband to call me. She returned my call regarding PREP class intake assessment appointment. We have scheduled her at the [facility] on Wednesday 08/25/2023 at 1300.

## 2023-08-23 ENCOUNTER — Encounter: Payer: Self-pay | Admitting: Orthopaedic Surgery

## 2023-08-23 ENCOUNTER — Ambulatory Visit: Payer: Medicare Other | Admitting: Cardiovascular Disease

## 2023-08-23 ENCOUNTER — Ambulatory Visit (INDEPENDENT_AMBULATORY_CARE_PROVIDER_SITE_OTHER): Payer: Medicare Other | Admitting: Orthopaedic Surgery

## 2023-08-23 ENCOUNTER — Other Ambulatory Visit (INDEPENDENT_AMBULATORY_CARE_PROVIDER_SITE_OTHER): Payer: Medicare Other

## 2023-08-23 DIAGNOSIS — Z96651 Presence of right artificial knee joint: Secondary | ICD-10-CM

## 2023-08-23 NOTE — Progress Notes (Signed)
The patient is an 83 year old female who is now 56-month status post a right total knee arthroplasty.  She has had a hard time with getting her motion back and dealing with pain with a right knee replacement.  One of my colleagues placed a partial knee on her left side that has done very well but she is starting to have some pain in that knee.  She reports her range of motion and strength are improving.  On exam her extension is almost full and her flexion is to just past 90 degrees.  Overall it does finally look better than what it has been.  Her left knee most probably with no effusion.  Both knees feel ligamentously stable.  Standing AP shows both knees shows both implants look good on the AP view.  Lateral view of the right knee she also shows a well-seated total arthroplasty.  Hopefully with time this swelling will continue to subside.  I would like to see her back for a final visit in 6 months from now when she will be well over a year out from surgery to see what she is looking like from the swelling and pain standpoint.  We can have an AP and lateral of both knees at that visit.

## 2023-08-25 ENCOUNTER — Encounter: Payer: Self-pay | Admitting: *Deleted

## 2023-08-25 NOTE — Progress Notes (Signed)
 [facility] PREP Evaluation  Patient Details  Name: Melissa Maldonado MRN: [id number] Date of Birth: [birth year] Age: 83 y.o. PCP: Clarice Nottingham, MD  There were no vitals filed for this visit.   [facility] Eval - 08/25/23 1300       [facility] PREP Location   [facility] PREP Location [facility]      Referral    Referring Provider Vannie    Reason for referral High Cholesterol;Hypertension;Obesitity/Overweight    Program Start Date 08/30/23    Program End Date 11/17/23      Measurement   Waist Circumference 43.75 inches    Hip Circumference 47 inches    Body fat 47.5 percent      Information for Trainer   Goals Continue to exerise regularly. increase strength, increase flexability, weight loss 8-10 lbs in 12 weeks    Current Exercise Goes to [employer] 2-3 days/week    Orthopedic Concerns Bilateral knee replacements, rt shoulder repair, low back pain    Pertinent Medical History A-fib, HTN, MO    Current Barriers none anticipated    Restrictions/Precautions Other    Medications that affect exercise --   takes xarelto      Mobility and Daily Activities   I find it easy to walk up or down two or more flights of stairs. 2    I have no trouble taking out the trash. 4    I do housework such as vacuuming and dusting on my own without difficulty. 4    I can easily lift a gallon of milk (8lbs). 4    I can easily walk a mile. 1    I have no trouble reaching into high cupboards or reaching down to pick up something from the floor. 2    I do not have trouble doing out-door work such as loss adjuster, chartered, raking leaves, or gardening. 1      Mobility and Daily Activities   I feel younger than my age. 2    I feel independent. 3    I feel energetic. 2    I live an active life.  3    I feel strong. 3    I feel healthy. 2    I feel active as other people my age. 2      How fit and strong are you.   Fit and Strong Total Score 35            Past Medical History:  Diagnosis Date   Arthritis     Chronic back pain    lumbar stenosis   Dysrhythmia    Early cataracts, bilateral    GERD (gastroesophageal reflux disease)    occasionally but no meds required   History of blood transfusion    History of colon polyps    Hyperlipidemia    takes Lipitor every other day   Hypertension    takes losartan  daily   Neuropathy    Osteoarthritis of left knee 04/27/2014   Restless leg    Shortness of breath    with exertion   SOB (shortness of breath) 2013   CPET   Tear of medial meniscus of left knee 09/08/2013   Past Surgical History:  Procedure Laterality Date   APPENDECTOMY     BACK SURGERY  07/20/2008   lumb fusion   CARDIOVERSION N/A 01/29/2023   Procedure: CARDIOVERSION;  Surgeon: Alvan Ronal BRAVO, MD;  Location: [facility];  Service: Cardiovascular;  Laterality: N/A;   COLONOSCOPY  2018   EYE SURGERY     both cataracts   KNEE ARTHROSCOPY WITH MEDIAL MENISECTOMY Left 09/08/2013   Procedure: LEFT KNEE ARTHROSCOPY WITH PARTIAL MEDIAL MENISCECTOMY;  Surgeon: Fonda SHAUNNA Olmsted, MD;  Location: [facility];  Service: Orthopedics;  Laterality: Left;   LUMBAR LAMINECTOMY/DECOMPRESSION MICRODISCECTOMY  05/13/2012   Procedure: LUMBAR LAMINECTOMY/DECOMPRESSION MICRODISCECTOMY 1 LEVEL;  Surgeon: Victory Gens, MD;  Location: [facility];  Service: Neurosurgery;  Laterality: Bilateral;  Bilateral Lumbar one -two Decompressive Laminectomy   PARTIAL KNEE ARTHROPLASTY Left 04/27/2014   Procedure: LEFT UNICOMPARTMENTAL KNEE;  Surgeon: Fonda SHAUNNA Olmsted, MD;  Location: [facility];  Service: Orthopedics;  Laterality: Left;   TOTAL KNEE ARTHROPLASTY Right 11/06/2022   Procedure: RIGHT TOTAL KNEE ARTHROPLASTY;  Surgeon: Vernetta Lonni GRADE, MD;  Location: [facility];  Service: Orthopedics;  Laterality: Right;   TUBAL LIGATION  1974   UPPER GI ENDOSCOPY     Social History   Tobacco Use  Smoking Status Never  Smokeless Tobacco Never    [place] 08/25/2023,  1:38 PM  PREP class beginning 08/30/2023 every Monday/Wednesday at the [facility] from [id number] for 12 weeks. No barriers to participation noted.

## 2023-08-30 ENCOUNTER — Encounter: Payer: Self-pay | Admitting: *Deleted

## 2023-08-30 NOTE — Progress Notes (Signed)
 [employer] PREP Weekly Session  Patient Details  Name: Melissa Maldonado MRN: [id number] Date of Birth: [birth year] Age: 83 y.o. PCP: Imelda Man, MD  Vitals:   08/30/23 1300  Weight: 205 lb (93 kg)     [facility] Weekly seesion - 08/30/23 1500       [facility] "PREP" Location   [facility] "PREP" Location [name]      Weekly Session   Topic Discussed Goal setting and welcome to the program   Introductions, text book review, perceived exertion scale, tour facility, homework: no sitting longer than 30 minutes.   Classes attended to date 1             [name] 08/30/2023, 9:12 PM

## 2023-08-31 DIAGNOSIS — M5416 Radiculopathy, lumbar region: Secondary | ICD-10-CM | POA: Diagnosis not present

## 2023-09-06 ENCOUNTER — Encounter: Payer: Self-pay | Admitting: *Deleted

## 2023-09-06 NOTE — Progress Notes (Signed)
 [employer] PREP Weekly Session  Patient Details  Name: Melissa Maldonado MRN: [id number] Date of Birth: 1940-08-20 Age: 83 y.o. PCP: Merri Brunette, MD  Vitals:   09/06/23 1300  Weight: 205 lb (93 kg)     [facility] Weekly seesion - 09/06/23 1500       [facility] "PREP" Location   [facility] "PREP" Location [name]      Weekly Session   Topic Discussed Other ways to be active;Importance of resistance training   Discussed components of a balanced fitness plan, benefits of cardio and strength training, national standards 150 mins/week of cardio, strength training 2-4 days/week for 20-40 minutes per session.   Classes attended to date 3             [name] 09/06/2023, 4:56 PM

## 2023-09-13 ENCOUNTER — Encounter: Payer: Self-pay | Admitting: *Deleted

## 2023-09-13 NOTE — Progress Notes (Signed)
 [employer] PREP Weekly Session  Patient Details  Name: Melissa Maldonado MRN: [id number] Date of Birth: 1940/10/28 Age: 83 y.o. PCP: Merri Brunette, MD  Vitals:   09/13/23 1300  Weight: 203 lb (92.1 kg)     [facility] Weekly seesion - 09/13/23 1500       [facility] "PREP" Location   [facility] "PREP" Location [name]      Weekly Session   Topic Discussed Healthy eating tips   Healthy carbohydrates, fats and proteins. Keeping added sugars to 24g/day for women, 36g/day for men. Sodium to 1500-2400mg /day.   Minutes exercised this week 25 minutes    Classes attended to date 4             [name] 09/13/2023, 7:54 PM

## 2023-09-20 ENCOUNTER — Encounter: Payer: Self-pay | Admitting: *Deleted

## 2023-09-20 NOTE — Progress Notes (Signed)
 [facility] PREP Weekly Session  Patient Details  Name: Melissa Maldonado MRN: [id number] Date of Birth: 18-Jul-1941 Age: 83 y.o. PCP: Merri Brunette, MD  Vitals:   09/20/23 1300  Weight: 206 lb (93.4 kg)     [facility] Weekly seesion - 09/20/23 1500       [facility] "PREP" Location   [facility] "PREP" [facility]      Weekly Session   Topic Discussed Health habits;Water   Staying motivted, addicting nature of sugar, tips for sugar reduction, liquid sugar, hidden sugar, sugar demo   Minutes exercised this week 75 minutes    Classes attended to date 5             [name] 09/20/2023, 7:56 PM

## 2023-09-24 ENCOUNTER — Ambulatory Visit (INDEPENDENT_AMBULATORY_CARE_PROVIDER_SITE_OTHER)

## 2023-09-24 ENCOUNTER — Ambulatory Visit: Payer: Medicare Other | Attending: Cardiovascular Disease | Admitting: Cardiovascular Disease

## 2023-09-24 ENCOUNTER — Encounter: Payer: Self-pay | Admitting: Cardiovascular Disease

## 2023-09-24 VITALS — BP 144/50 | HR 38 | Ht 65.0 in | Wt 208.0 lb

## 2023-09-24 DIAGNOSIS — R0609 Other forms of dyspnea: Secondary | ICD-10-CM | POA: Diagnosis not present

## 2023-09-24 DIAGNOSIS — I4819 Other persistent atrial fibrillation: Secondary | ICD-10-CM | POA: Diagnosis not present

## 2023-09-24 DIAGNOSIS — I10 Essential (primary) hypertension: Secondary | ICD-10-CM | POA: Diagnosis not present

## 2023-09-24 DIAGNOSIS — R001 Bradycardia, unspecified: Secondary | ICD-10-CM | POA: Diagnosis not present

## 2023-09-24 NOTE — Progress Notes (Unsigned)
 Enrolled patient for a 7 day Zio XT monitor to be mailed to patients home.

## 2023-09-24 NOTE — Assessment & Plan Note (Signed)
 History of essential hypertension her blood pressure measured today at 144/50.  She is on amlodipine, hydralazine and Micardis.

## 2023-09-24 NOTE — Assessment & Plan Note (Signed)
 He GF persistent A-fib status post successful DC cardioversion 01/29/2023 to sinus rhythm.  She is in sinus bradycardia today with a rate of 38 although she is asymptomatic from this on Xarelto.

## 2023-09-24 NOTE — Assessment & Plan Note (Signed)
 Sinus bradycardia with a heart rate of 38 today on no negative chronotropic drugs.  She is totally asymptomatic from this.  I am going to get a 1 week Zio patch to further evaluate.

## 2023-09-24 NOTE — Assessment & Plan Note (Signed)
 Dyspnea on exertion which she has had for many years mostly with heavy exertion and long distance walking.  Her 2D echo performed 09/28/2022 revealed normal LV systolic function without valvular abnormalities.  She does have a coronary calcium score of 0.  I do not think that her dyspnea has a cardiovascular etiology.

## 2023-09-24 NOTE — Progress Notes (Signed)
 09/24/2023 Melissa Maldonado   25-Jun-1941  [id number]  Primary Physician Merri Brunette, MD Primary Cardiologist: Runell Gess MD Nicholes Calamity, [place]  HPI:  Melissa Maldonado is a 83 y.o.  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 Renne Crigler, her PCP, for evaluation of progressive dyspnea on exertion.  I last saw her in the office 10/22/2020. I apparently saw her many years ago and did a MET test which was abnormal.  She does have treated hypertension.  There is no family history of heart disease.  She is never had a heart attack or stroke.  She denies chest pain.  She is noticed increasing dyspnea on exertion for last 6 months especially walking up stairs although she does also admit to having gained some weight as well.    A 2D echo performed 02/07/2020 that showed normal LV systolic function with grade 2 diastolic dysfunction and mild pulmonary hypertension.  A coronary calcium score was 0 suggesting no evidence of CAD.      Since I saw her in the office 3 years ago she did undergo successful DC cardioversion 01/29/2023.  She remains on Xarelto and sinus bradycardia at a rate of 38.  She does complain of some dyspnea when walking long distances but denies chest pain.   Current Meds  Medication Sig   acetaminophen (TYLENOL) 500 MG tablet Take 1,000 mg by mouth every 6 (six) hours as needed for moderate pain or mild pain.   amLODipine (NORVASC) 5 MG tablet Take 5 mg by mouth daily.   Cholecalciferol (VITAMIN D) 50 MCG (2000 UT) tablet Take 2,000 Units by mouth daily.   dorzolamide (TRUSOPT) 2 % ophthalmic solution Place 1 drop into the left eye 2 (two) times daily.   Rivaroxaban (XARELTO) 15 MG TABS tablet Take 1 tablet (15 mg total) by mouth daily with supper. Take 1 st dose 11/07/22 at 1700 hrs.   rOPINIRole (REQUIP) 0.5 MG tablet Take  0.5 mg by mouth at bedtime. 1-3 hours before bedtime   telmisartan (MICARDIS) 40 MG tablet Take 40 mg by mouth daily.     Allergies  Allergen Reactions   Aspirin Other (See Comments)    "knocked out"   Vicks Vaporub [Liniments] Other (See Comments)    Makes her feel "out of it"   Codeine Nausea Only    Social History   Socioeconomic History   Marital status: Married    Spouse name: Not on file   Number of children: 2   Years of education: 12   Highest education level: High school graduate  Occupational History   Occupation: Retired  Tobacco Use   Smoking status: Never   Smokeless tobacco: Never  Vaping Use   Vaping status: Never Used  Substance and Sexual Activity   Alcohol use: Not Currently    Comment: socially wine   Drug use: Never   Sexual activity: Yes    Birth control/protection: Post-menopausal  Other Topics Concern   Not on file  Social History Narrative   Lives at home with her husband.   Right-handed.   One cup coffee per day.   Social Drivers of Corporate investment banker Strain: Not on file  Food Insecurity: No Food Insecurity (11/06/2022)   Hunger Vital Sign    Worried About Running Out of Food in the  Last Year: Never true    Ran Out of Food in the Last Year: Never true  Transportation Needs: No Transportation Needs (05/12/2023)   PRAPARE - Administrator, Civil Service (Medical): No    Lack of Transportation (Non-Medical): No  Physical Activity: Not on file  Stress: Not on file  Social Connections: Not on file  Intimate Partner Violence: Not At Risk (11/06/2022)   Humiliation, Afraid, Rape, and Kick questionnaire    Fear of Current or Ex-Partner: No    Emotionally Abused: No    Physically Abused: No    Sexually Abused: No     Review of Systems: General: negative for chills, fever, night sweats or weight changes.  Cardiovascular: negative for chest pain, dyspnea on exertion, edema, orthopnea, palpitations, paroxysmal nocturnal  dyspnea or shortness of breath Dermatological: negative for rash Respiratory: negative for cough or wheezing Urologic: negative for hematuria Abdominal: negative for nausea, vomiting, diarrhea, bright red blood per rectum, melena, or hematemesis Neurologic: negative for visual changes, syncope, or dizziness All other systems reviewed and are otherwise negative except as noted above.    Blood pressure (!) 144/50, pulse (!) 38, height 5\' 5"  (1.651 m), weight 208 lb (94.3 kg), SpO2 97%.  General appearance: alert and no distress Neck: no adenopathy, no carotid bruit, no JVD, supple, symmetrical, trachea midline, and thyroid not enlarged, symmetric, no tenderness/mass/nodules Lungs: clear to auscultation bilaterally Heart: regular rate and rhythm, S1, S2 normal, no murmur, click, rub or gallop Extremities: extremities normal, atraumatic, no cyanosis or edema Pulses: 2+ and symmetric Skin: Skin color, texture, turgor normal. No rashes or lesions Neurologic: Grossly normal  EKG EKG Interpretation Date/Time:  Friday September 24 2023 12:02:29 EST Ventricular Rate:  38 PR Interval:  132 QRS Duration:  86 QT Interval:  464 QTC Calculation: 368 R Axis:   -6  Text Interpretation: Marked sinus bradycardia When compared with ECG of 07-Jun-2023 09:34, No significant change was found Confirmed by Nanetta Batty [phone]) on 09/24/2023 12:09:27 PM    ASSESSMENT AND PLAN:   Essential hypertension History of essential hypertension her blood pressure measured today at 144/50.  She is on amlodipine, hydralazine and Micardis.  Sinus bradycardia Sinus bradycardia with a heart rate of 38 today on no negative chronotropic drugs.  She is totally asymptomatic from this.  I am going to get a 1 week Zio patch to further evaluate.  Dyspnea on exertion Dyspnea on exertion which she has had for many years mostly with heavy exertion and long distance walking.  Her 2D echo performed 09/28/2022 revealed normal LV  systolic function without valvular abnormalities.  She does have a coronary calcium score of 0.  I do not think that her dyspnea has a cardiovascular etiology.  Persistent atrial fibrillation (HCC) He GF persistent A-fib status post successful DC cardioversion 01/29/2023 to sinus rhythm.  She is in sinus bradycardia today with a rate of 38 although she is asymptomatic from this on Xarelto.     Runell Gess MD FACP,FACC,FAHA, [facility] 09/24/2023 12:22 PM

## 2023-09-24 NOTE — Patient Instructions (Addendum)
 Medication Instructions:  Your physician recommends that you continue on your current medications as directed. Please refer to the Current Medication list given to you today.  *If you need a refill on your cardiac medications before your next appointment, please call your pharmacy*   Testing/Procedures: ZIO XT- Long Term Monitor Instructions  Your physician has requested you wear a ZIO patch monitor for 7 days.  This is a single patch monitor. [employer] supplies one patch monitor per enrollment. Additional stickers are not available. Please do not apply patch if you will be having a Nuclear Stress Test,  Echocardiogram, Cardiac CT, MRI, or Chest Xray during the period you would be wearing the  monitor. The patch cannot be worn during these tests. You cannot remove and re-apply the  ZIO XT patch monitor.  Your ZIO patch monitor will be mailed 3 day USPS to your address on file. It may take 3-5 days  to receive your monitor after you have been enrolled.  Once you have received your monitor, please review the enclosed instructions. Your monitor  has already been registered assigning a specific monitor serial # to you.  Billing and Patient Assistance Program Information  We have supplied [employer] with any of your insurance information on file for billing purposes. [employer] offers a sliding scale Patient Assistance Program for patients that do not have  insurance, or whose insurance does not completely cover the cost of the ZIO monitor.  You must apply for the Patient Assistance Program to qualify for this discounted rate.  To apply, please call [employer] at [phone], select option 4, select option 2, ask to apply for  Patient Assistance Program. Meredeth Ide will ask your household income, and how many people  are in your household. They will quote your out-of-pocket cost based on that information.  [employer] will also be able to set up a 41-month, interest-free payment plan if needed.  Applying  the monitor   Shave hair from upper left chest.  Hold abrader disc by orange tab. Rub abrader in 40 strokes over the upper left chest as  indicated in your monitor instructions.  Clean area with 4 enclosed alcohol pads. Let dry.  Apply patch as indicated in monitor instructions. Patch will be placed under collarbone on left  side of chest with arrow pointing upward.  Rub patch adhesive wings for 2 minutes. Remove white label marked "1". Remove the white  label marked "2". Rub patch adhesive wings for 2 additional minutes.  While looking in a mirror, press and release button in center of patch. A small green light will  flash 3-4 times. This will be your only indicator that the monitor has been turned on.  Do not shower for the first 24 hours. You may shower after the first 24 hours.  Press the button if you feel a symptom. You will hear a small click. Record Date, Time and  Symptom in the Patient Logbook.  When you are ready to remove the patch, follow instructions on the last 2 pages of Patient  Logbook. Stick patch monitor onto the last page of Patient Logbook.  Place Patient Logbook in the blue and white box. Use locking tab on box and tape box closed  securely. The blue and white box has prepaid postage on it. Please place it in the mailbox as  soon as possible. Your physician should have your test results approximately 7 days after the  monitor has been mailed back to [facility].  Call [facility]  at [phone] if you have questions regarding  your ZIO XT patch monitor. Call them immediately if you see an orange light blinking on your  monitor.  If your monitor falls off in less than 4 days, contact our Monitor department at [phone].  If your monitor becomes loose or falls off after 4 days call [employer] at [phone] for  suggestions on securing your monitor    Follow-Up: At [facility], you and your health needs are our priority.  As part  of our continuing mission to provide you with exceptional heart care, we have created designated Provider Care Teams.  These Care Teams include your primary Cardiologist (physician) and Advanced Practice Providers (APPs -  Physician Assistants and Nurse Practitioners) who all work together to provide you with the care you need, when you need it.  We recommend signing up for the patient portal called "MyChart".  Sign up information is provided on this After Visit Summary.  MyChart is used to connect with patients for Virtual Visits (Telemedicine).  Patients are able to view lab/test results, encounter notes, upcoming appointments, etc.  Non-urgent messages can be sent to your provider as well.   To learn more about what you can do with MyChart, go to [email or web address].    Your next appointment:   6 month(s)  Provider:   Gillian Shields, NP    Then, see Dr. Allyson Sabal in 12 months.  Other Instructions   1st Floor: - Lobby - Registration  - Pharmacy  - Lab - Cafe  2nd Floor: - PV Lab - Diagnostic Testing (echo, CT, nuclear med)  3rd Floor: - Vacant  4th Floor: - TCTS (cardiothoracic surgery) - AFib Clinic - Structural Heart Clinic - Vascular Surgery  - Vascular Ultrasound  5th Floor: - HeartCare Cardiology (general and EP) - Clinical Pharmacy for coumadin, hypertension, lipid, weight-loss medications, and med management appointments    Valet parking services will be available as well.

## 2023-09-27 ENCOUNTER — Encounter: Payer: Self-pay | Admitting: *Deleted

## 2023-09-27 DIAGNOSIS — R001 Bradycardia, unspecified: Secondary | ICD-10-CM

## 2023-09-27 NOTE — Progress Notes (Signed)
 [facility] [facility] Weekly Session  Patient Details  Name: Melissa Maldonado MRN: [id number] Date of Birth: [birth year] Age: 83 y.o. PCP: Merri Brunette, MD  Vitals:   09/27/23 1435  Weight: 208 lb (94.3 kg)     [facility] Weekly seesion - 09/27/23 1400       [facility] "PREP" Location   [facility]      Weekly Session   Topic Discussed Restaurant Eating   Sodium intake 1500-2300mg /day. Effects of sodium on your body, salt demo   Minutes exercised this week 75 minutes    Classes attended to date 7             [name] 09/27/2023, 2:36 PM

## 2023-10-04 ENCOUNTER — Encounter: Payer: Self-pay | Admitting: *Deleted

## 2023-10-04 NOTE — Progress Notes (Signed)
 [facility] [facility] Weekly Session  Patient Details  Name: Melissa Maldonado MRN: [id number] Date of Birth: 16-Mar-1941 Age: 83 y.o. PCP: Merri Brunette, MD  Vitals:   10/04/23 1441  Weight: 209 lb (94.8 kg)     [facility] Weekly seesion - 10/04/23 1400       [facility]PREP" Location   [facility]      Weekly Session   Topic Discussed Stress management and problem solving   Importance of sleep and stratagies to improve sleep quality, meditation exercise.   Classes attended to date 41             [name] 10/04/2023, 2:42 PM

## 2023-10-06 DIAGNOSIS — Z6833 Body mass index (BMI) 33.0-33.9, adult: Secondary | ICD-10-CM | POA: Diagnosis not present

## 2023-10-06 DIAGNOSIS — M5416 Radiculopathy, lumbar region: Secondary | ICD-10-CM | POA: Diagnosis not present

## 2023-10-11 ENCOUNTER — Encounter: Payer: Self-pay | Admitting: *Deleted

## 2023-10-11 NOTE — Progress Notes (Signed)
 [facility] PRE[facility] Weekly Session  Patient Details  Name: Melissa Maldonado MRN: [id number] Date of Birth: 01/06/1941 Age: 83 y.o. PCP: Merri Brunette, MD  Vitals:   10/11/23 1440  Weight: 207 lb (93.9 kg)     [facility] Weekly seesion - 10/11/23 1400       [facility]PREP" Location   [facility]      Weekly Session   Topic Discussed Expectations and non-scale victories   Halfway through! Restate and reset goals. Permanent objectives. Discuss Blue zones.   Minutes exercised this week 75 minutes    Classes attended to date 28             [name] 10/11/2023, 2:41 PM

## 2023-10-12 DIAGNOSIS — N2889 Other specified disorders of kidney and ureter: Secondary | ICD-10-CM | POA: Diagnosis not present

## 2023-10-12 DIAGNOSIS — D631 Anemia in chronic kidney disease: Secondary | ICD-10-CM | POA: Diagnosis not present

## 2023-10-12 DIAGNOSIS — N189 Chronic kidney disease, unspecified: Secondary | ICD-10-CM | POA: Diagnosis not present

## 2023-10-12 DIAGNOSIS — I129 Hypertensive chronic kidney disease with stage 1 through stage 4 chronic kidney disease, or unspecified chronic kidney disease: Secondary | ICD-10-CM | POA: Diagnosis not present

## 2023-10-12 DIAGNOSIS — N1832 Chronic kidney disease, stage 3b: Secondary | ICD-10-CM | POA: Diagnosis not present

## 2023-10-12 DIAGNOSIS — N2581 Secondary hyperparathyroidism of renal origin: Secondary | ICD-10-CM | POA: Diagnosis not present

## 2023-10-12 DIAGNOSIS — R609 Edema, unspecified: Secondary | ICD-10-CM | POA: Diagnosis not present

## 2023-10-14 DIAGNOSIS — R001 Bradycardia, unspecified: Secondary | ICD-10-CM | POA: Diagnosis not present

## 2023-10-15 ENCOUNTER — Telehealth: Payer: Self-pay | Admitting: Cardiovascular Disease

## 2023-10-15 DIAGNOSIS — R0609 Other forms of dyspnea: Secondary | ICD-10-CM

## 2023-10-15 DIAGNOSIS — R001 Bradycardia, unspecified: Secondary | ICD-10-CM

## 2023-10-15 NOTE — Telephone Encounter (Signed)
 [employer] calling w/ abn zio results

## 2023-10-15 NOTE — Telephone Encounter (Signed)
   Cardiac Monitor Alert  Date of alert:  10/15/2023   Patient Name: Melissa Maldonado  DOB: 1941/05/20  MRN: [id number]   [facility] Cardiologist: Nanetta Batty, MD  [facility] EP:  None    Monitor Information: Long Term Monitor [ZioXT]  7 days (ordered 09/24/2023)  Reason:  Sinus bradycardia ; c/o Progressive DOE  Ordering provider:  Nanetta Batty, MD   Alert Bradycardia - slowest HR: HR=29, lasted 60 seconds on pages 13 & 14, Strip #8   Next Cardiology Appointment   Date:  12/06/2023  Provider:  Nelva Bush, PA at [facility]  The patient could NOT be reached by telephone today.  Called and spoke to pt's husband (pt not available).  Advised him that someone will be reaching out to her about EP appointment/consult regarding 7 day zio results.  Arrhythmia, symptoms and history reviewed with Dr. Thurmon Fair (DOD).   Plan:  EP Referral, urgent.    Candace Cruise [place], [place]  10/15/2023 11:21 AM

## 2023-10-18 ENCOUNTER — Encounter: Payer: Self-pay | Admitting: *Deleted

## 2023-10-18 NOTE — Progress Notes (Signed)
 [facility] [facility] Weekly Session  Patient Details  Name: Melissa Maldonado MRN: [id number] Date of Birth: 02/12/1941 Age: 83 y.o. PCP: Merri Brunette, MD  Vitals:   10/18/23 1440  Weight: 202 lb (91.6 kg)     [facility] Weekly seesion - 10/18/23 1400       [facility]PREP" Location   [facility]      Weekly Session   Topic Discussed Other   Portion control, confusing and deceptive labeling   Minutes exercised this week 75 minutes    Classes attended to date 13             [name] 10/18/2023, 2:41 PM

## 2023-10-19 DIAGNOSIS — H40011 Open angle with borderline findings, low risk, right eye: Secondary | ICD-10-CM | POA: Diagnosis not present

## 2023-10-19 DIAGNOSIS — H401220 Low-tension glaucoma, left eye, stage unspecified: Secondary | ICD-10-CM | POA: Diagnosis not present

## 2023-10-20 NOTE — Telephone Encounter (Signed)
Follow Up:    Patient is returning  a call from yesterday, concerning her results.

## 2023-10-20 NOTE — Telephone Encounter (Signed)
 Spoke to patient monitor results given.Appointment scheduled with Gillian Shields NP 4/11 at 8:50 am to discuss results further.

## 2023-10-21 NOTE — Telephone Encounter (Signed)
 Referral for EP received. Spoke w/ patient - she is scheduled to see Dr. Elberta Fortis on 4/9 for urgent eval for PPM.

## 2023-10-25 ENCOUNTER — Encounter: Payer: Self-pay | Admitting: *Deleted

## 2023-10-26 NOTE — Progress Notes (Signed)
 [facility] Weekly Session  Patient Details  Name: Melissa Maldonado MRN: [id number] Date of Birth: 1941/04/01 Age: 83 y.o. PCP: Merri Brunette, MD  Vitals:   10/26/23 0805  Weight: 206 lb (93.4 kg)     [facility] Weekly seesion - 10/25/23 1400       [facility] "PREP" Location   [name]      Weekly Session   Topic Discussed Finding support   Accountabitiy partners, tips for staying on track, identifying non supportive people.   Minutes exercised this week 75 minutes    Classes attended to date 24             [name] 10/26/2023, 8:06 AM

## 2023-10-26 NOTE — Progress Notes (Unsigned)
 Electrophysiology Office Note:   Date:  10/27/2023  ID:  Melissa Maldonado, DOB [birth year], MRN [id number]  Primary Cardiologist: Nanetta Batty, MD Primary Heart Failure: None Electrophysiologist: Tomicka Lover Jorja Loa, MD      History of Present Illness:   Melissa Maldonado is a 83 y.o. female with h/o hypertension, obesity, persistent atrial fibrillation seen today for  for Electrophysiology evaluation of atrial fibrillation at the request of Nanetta Batty.    She was diagnosed with atrial fibrillation at the time of knee surgery.  She did not have any awareness of her atrial fibrillation.  She had a cardioversion 01/29/2023.  She recently wore a cardiac monitor which showed no atrial fibrillation, but sinus bradycardia.  She was having pauses of up to 4.4 seconds.  These pauses occurred between 4 and 6 AM.  Today, denies symptoms of palpitations, chest pain, orthopnea, PND, lower extremity edema, claudication, dizziness, presyncope, syncope, bleeding, or neurologic sequela. The patient is tolerating medications without difficulties.  Complains of shortness of breath with exertion.  She is short of breath when she walks into the [facility].  She has to walk up a hill.  She does do an exercise class with 15 minutes of cardio and weights.  She does well when she is doing these activities.  She was not having any issues with shortness of breath with exertion prior to 4 to 5 months ago.  Review of systems complete and found to be negative unless listed in HPI.   EP Information / Studies Reviewed:    EKG is not ordered today. EKG from 09/24/22 reviewed which showed sinus rhythm, rate 38       Risk Assessment/Calculations:    CHA2DS2-VASc Score = 4   This indicates a 4.8% annual risk of stroke. The patient's score is based upon: CHF History: 0 HTN History: 1 Diabetes History: 0 Stroke History: 0 Vascular Disease History: 0 Age Score: 2 Gender Score: 1            Physical Exam:   VS:  BP (!)  168/70 (BP Location: Left Arm, Patient Position: Sitting, Cuff Size: Large)   Pulse (!) 50   Ht 5\' 5"  (1.651 m)   Wt 198 lb (89.8 kg)   SpO2 97%   BMI 32.95 kg/m    Wt Readings from Last 3 Encounters:  10/27/23 198 lb (89.8 kg)  10/26/23 206 lb (93.4 kg)  10/18/23 202 lb (91.6 kg)     GEN: Well nourished, well developed in no acute distress NECK: No JVD; No carotid bruits CARDIAC: Regular rate and rhythm, no murmurs, rubs, gallops RESPIRATORY:  Clear to auscultation without rales, wheezing or rhonchi  ABDOMEN: Soft, non-tender, non-distended EXTREMITIES:  No edema; No deformity   ASSESSMENT AND PLAN:    1.  Persistent atrial fibrillation: Remains in sinus rhythm.  [name] continue with current management.  2.  Secondary hypercoagulable state: Currently on Xarelto for atrial fibrillation  3.  Hypertension: Elevated today.  Well-controlled.  No changes.  4.  Sinus bradycardia: I am concerned that she may have chronotropic incompetence.  She wore a cardiac monitor that showed average heart rate in the 40s.  Unfortunately I am unable to tell if she is exerting herself while wearing this monitor.  [name] plan for exercise treadmill testing.  If she is not able to increase her heart rate, she [name] require pacemaker implantation.  Explained risks, benefits, and alternatives to PPM implantation, including but not limited to bleeding, infection, pneumothorax, pericardial effusion,  lead dislodgement, heart attack, stroke, or death.  Pt verbalized understanding and agrees to proceed.  5.  Snoring/witnessed apnea: Noted by her husband.  Has nocturnal pauses.  [name] plan for sleep study.  Discussed with primary cardiology  Follow up with Dr. Elberta Fortis  pending sleep study   Signed, [name] Jorja Loa, MD

## 2023-10-27 ENCOUNTER — Encounter: Payer: Self-pay | Admitting: Cardiology

## 2023-10-27 ENCOUNTER — Ambulatory Visit: Attending: Cardiology | Admitting: Cardiology

## 2023-10-27 VITALS — BP 168/70 | HR 50 | Ht 65.0 in | Wt 198.0 lb

## 2023-10-27 DIAGNOSIS — I455 Other specified heart block: Secondary | ICD-10-CM

## 2023-10-27 DIAGNOSIS — D6869 Other thrombophilia: Secondary | ICD-10-CM | POA: Diagnosis not present

## 2023-10-27 DIAGNOSIS — R0683 Snoring: Secondary | ICD-10-CM

## 2023-10-27 DIAGNOSIS — I4819 Other persistent atrial fibrillation: Secondary | ICD-10-CM

## 2023-10-27 DIAGNOSIS — R001 Bradycardia, unspecified: Secondary | ICD-10-CM

## 2023-10-27 NOTE — Patient Instructions (Signed)
 Medication Instructions:  Your physician recommends that you continue on your current medications as directed. Please refer to the Current Medication list given to you today.  *If you need a refill on your cardiac medications before your next appointment, please call your pharmacy*  Lab Work: None ordered.  If you have labs (blood work) drawn today and your tests are completely normal, you will receive your results only by: MyChart Message (if you have MyChart) OR A paper copy in the mail If you have any lab test that is abnormal or we need to change your treatment, we will call you to review the results.  Testing/Procedures: Your physician has requested that you have an exercise tolerance test. For further information please visit [email or web address] Please also follow instruction sheet, as given.   Your physician has recommended that you have a sleep study. This test records several body functions during sleep, including: brain activity, eye movement, oxygen and carbon dioxide blood levels, heart rate and rhythm, breathing rate and rhythm, the flow of air through your mouth and nose, snoring, body muscle movements, and chest and belly movement.   Follow-Up: At [facility], you and your health needs are our priority.  As part of our continuing mission to provide you with exceptional heart care, our providers are all part of one team.  This team includes your primary Cardiologist (physician) and Advanced Practice Providers or APPs (Physician Assistants and Nurse Practitioners) who all work together to provide you with the care you need, when you need it.  Your next appointment:   To be determined      1st Floor: - Lobby - Registration  - Pharmacy  - Lab - Cafe  2nd Floor: - PV Lab - Diagnostic Testing (echo, CT, nuclear med)  3rd Floor: - Vacant  4th Floor: - TCTS (cardiothoracic surgery) - AFib Clinic - Structural Heart Clinic - Vascular Surgery  - Vascular  Ultrasound  5th Floor: - HeartCare Cardiology (general and EP) - Clinical Pharmacy for coumadin, hypertension, lipid, weight-loss medications, and med management appointments    Valet parking services will be available as well.

## 2023-10-28 ENCOUNTER — Ambulatory Visit: Attending: Internal Medicine

## 2023-10-28 DIAGNOSIS — I455 Other specified heart block: Secondary | ICD-10-CM | POA: Diagnosis not present

## 2023-10-28 DIAGNOSIS — R001 Bradycardia, unspecified: Secondary | ICD-10-CM | POA: Insufficient documentation

## 2023-10-29 ENCOUNTER — Encounter (HOSPITAL_BASED_OUTPATIENT_CLINIC_OR_DEPARTMENT_OTHER): Payer: Self-pay | Admitting: Family

## 2023-10-29 ENCOUNTER — Ambulatory Visit (HOSPITAL_BASED_OUTPATIENT_CLINIC_OR_DEPARTMENT_OTHER): Admitting: Family

## 2023-10-29 VITALS — BP 124/56 | HR 48 | Ht 65.0 in | Wt 203.3 lb

## 2023-10-29 DIAGNOSIS — I10 Essential (primary) hypertension: Secondary | ICD-10-CM

## 2023-10-29 DIAGNOSIS — Z6833 Body mass index (BMI) 33.0-33.9, adult: Secondary | ICD-10-CM | POA: Diagnosis not present

## 2023-10-29 DIAGNOSIS — R400 Somnolence: Secondary | ICD-10-CM | POA: Diagnosis not present

## 2023-10-29 DIAGNOSIS — I480 Paroxysmal atrial fibrillation: Secondary | ICD-10-CM | POA: Diagnosis not present

## 2023-10-29 DIAGNOSIS — D6859 Other primary thrombophilia: Secondary | ICD-10-CM | POA: Diagnosis not present

## 2023-10-29 DIAGNOSIS — R001 Bradycardia, unspecified: Secondary | ICD-10-CM

## 2023-10-29 LAB — EXERCISE TOLERANCE TEST
Angina Index: 0
Duke Treadmill Score: 7
Estimated workload: 7
Exercise duration (min): 6 min
Exercise duration (sec): 30 s
MPHR: 140 {beats}/min
Peak HR: 108 {beats}/min
Percent HR: 77 %
Rest HR: 50 {beats}/min
ST Depression (mm): 0 mm

## 2023-10-29 NOTE — Progress Notes (Signed)
 Cardiology Office Note:  .   Date:  10/29/2023  ID:  Melissa Maldonado, DOB [birth year], MRN [id number] PCP: Merri Brunette, MD  [facility] Providers Cardiologist:  Nanetta Batty, MD Electrophysiologist:  Regan Lemming, MD    History of Present Illness: .   Melissa Maldonado is a 83 y.o. female  with a hx of hypertension, obesity, paroxysmal atrial fibrillation.    Initially established with Dr. Allyson Sabal for exertional dyspnea. Cardiac workup 01/2020 with coronary calcium score of 0. Echo 01/2020 normal LVEF 60-65%, no RWMA, mild LVH, gr2DD, mildly elevated PASP, trivial MR/AR.   Initially diagnosed with atrial fibrillation 08/2022 at time of right knee surgery.  She has since followed routinely with atrial fibrillation clinic.  She underwent successful cardioversion 01/29/2023.    Seen by Dr. Allyson Sabal 09/24/23 with bradycardia 38 bpm, monitor placed. Her BP was mildly elevated at 144/50. Seen by Dr. Elberta Fortis 10/27/23 after monitor revealed sinus bradycardia with pauses up to 4.4 seconds between 4-6a. Due to concern for chronotropic incompetence, plan for ETT and due to nocturnal pauses sleep study ordered.   Presents today for follow-up independently.  Pleasant lady from [place].  She is participating in PREP exercise program at [facility]. Yesterday after doing resistance band exercises her heart rate increased to 70 bpm when checked by OBGYN. She did her ETT yesterday and felt well, results pending. Often taking a nap midday around 4pm. Only requiring Lasix sparingly and it is effective when needed. Her exertional dyspnea particularly with hills is overall unchanged from previous.  ROS: Please see the history of present illness.    All other systems reviewed and are negative.   Studies Reviewed: .           Risk Assessment/Calculations:    CHA2DS2-VASc Score = 4   This indicates a 4.8% annual risk of stroke. The patient's score is based upon: CHF History: 0 HTN History: 1 Diabetes History:  0 Stroke History: 0 Vascular Disease History: 0 Age Score: 2 Gender Score: 1        STOP-Bang Score:  6      Physical Exam:   VS:  BP (!) 124/56 (BP Location: Right Arm, Patient Position: Sitting, Cuff Size: Normal)   Pulse (!) 48   Ht 5\' 5"  (1.651 m)   Wt 203 lb 4.8 oz (92.2 kg)   SpO2 96%   BMI 33.83 kg/m    Wt Readings from Last 3 Encounters:  10/29/23 203 lb 4.8 oz (92.2 kg)  10/27/23 198 lb (89.8 kg)  10/26/23 206 lb (93.4 kg)    GEN: Well nourished, well developed in no acute distress NECK: No JVD; No carotid bruits CARDIAC: bradycardic, RRR, no murmurs, rubs, gallops RESPIRATORY:  Clear to auscultation without rales, wheezing or rhonchi  ABDOMEN: Soft, non-tender, non-distended EXTREMITIES:  No edema; No deformity   ASSESSMENT AND PLAN: .    Paroxysmal atrial fibrillation / Bradycardia - RRR by auscultation. Bradycardic but asymptomatic with no lightheadedness, dizziness.  Completed ETT to assess for chronotropic incompetence yesterday, results pending. Following with Dr. Elberta Fortis.  CHA2DS2-VASc Score = 4 [CHF History: 0, HTN History: 1, Diabetes History: 0, Stroke History: 0, Vascular Disease History: 0, Age Score: 2, Gender Score: 1].  Therefore, the patient's annual risk of stroke is 4.8 %. Continue Xarelto 15mg  daily. Denies bleeding complications.    HTN - BP well controlled. Continue current antihypertensive regimen amlodipine 5 mg daily, chlorthalidone 25 mg daily, telmisartan 40 mg daily. Discussed to monitor  BP at home at least 2 hours after medications and sitting for 5-10 minutes.   Snores / Daytime somnolence - notes snoring and often needs nap midday around 4pm. Dr. Elberta Fortis has ordered [facility] home sleep study, await prior authorization.   Obesity / BMI 33 - Weight loss via diet and exercise encouraged. Discussed the impact being overweight would have on cardiovascular risk. Continue participating in PREP exercise program.        Dispo: follow up in 6 months  with Alver Sorrow, NP and in 12 months with Dr. Allyson Sabal  Signed, Alver Sorrow, NP

## 2023-10-29 NOTE — Patient Instructions (Signed)
 Medication Instructions:  Your physician recommends that you continue on your current medications as directed. Please refer to the Current Medication list given to you today.  *If you need a refill on your cardiac medications before your next appointment, please call your pharmacy*  Lab Work: NONE  Testing/Procedures: NONE  Follow-Up: At [facility], you and your health needs are our priority.  As part of our continuing mission to provide you with exceptional heart care, we have created designated Provider Care Teams.  These Care Teams include your primary Cardiologist (physician) and Advanced Practice Providers (APPs -  Physician Assistants and Nurse Practitioners) who all work together to provide you with the care you need, when you need it.  We recommend signing up for the patient portal called "MyChart".  Sign up information is provided on this After Visit Summary.  MyChart is used to connect with patients for Virtual Visits (Telemedicine).  Patients are able to view lab/test results, encounter notes, upcoming appointments, etc.  Non-urgent messages can be sent to your provider as well.   To learn more about what you can do with MyChart, go to [email or web address].    Your next appointment:   6 month(s)  The format for your next appointment:   In Person  Provider:   Ronn Melena NP   DR Allyson Sabal IN 1 YEAR

## 2023-11-01 ENCOUNTER — Telehealth: Payer: Self-pay | Admitting: *Deleted

## 2023-11-01 NOTE — Telephone Encounter (Signed)
 Saw patient for 1 year post op for her Right total knee while she was at hospital waiting for another appointment. Has f/u with surgeon coming up.

## 2023-11-04 DIAGNOSIS — E7800 Pure hypercholesterolemia, unspecified: Secondary | ICD-10-CM | POA: Diagnosis not present

## 2023-11-04 DIAGNOSIS — I10 Essential (primary) hypertension: Secondary | ICD-10-CM | POA: Diagnosis not present

## 2023-11-04 DIAGNOSIS — Z0000 Encounter for general adult medical examination without abnormal findings: Secondary | ICD-10-CM | POA: Diagnosis not present

## 2023-11-04 DIAGNOSIS — R7989 Other specified abnormal findings of blood chemistry: Secondary | ICD-10-CM | POA: Diagnosis not present

## 2023-11-04 LAB — LAB REPORT - SCANNED: EGFR: 30

## 2023-11-09 ENCOUNTER — Encounter: Payer: Self-pay | Admitting: *Deleted

## 2023-11-09 ENCOUNTER — Telehealth: Payer: Self-pay | Admitting: Cardiology

## 2023-11-09 NOTE — Progress Notes (Signed)
 [employer] PREP Weekly Session  Patient Details  Name: Melissa Maldonado MRN: [id number] Date of Birth: 06-Jul-1941 Age: 83 y.o. PCP: Imelda Man, MD  Vitals:   11/08/23 1500  Weight: 201 lb (91.2 kg)     [employer] Weekly seesion - 11/08/23 1500       [employer] "PREP" Location   [facility] "PREP" Location [name]      Weekly Session   Topic Discussed Calorie breakdown;Hitting roadblocks   Review macronutrients, discuss protein, maintaining motivation,discuss goals and activities sheet   Minutes exercised this week 75 minutes    Classes attended to date 39             [name] 11/09/2023, 12:57 PM

## 2023-11-09 NOTE — Telephone Encounter (Signed)
 Pt said she has not heard anything about doing her sleep study test. Please advise

## 2023-11-12 NOTE — Telephone Encounter (Signed)
**Note De-Identified Calvyn Kurtzman Obfuscation** Ordering provider: Dr Lawana Pray Associated diagnoses: Snoring-R06.83 and A-Fib-I48.0 WatchPAT PA obtained on 11/12/2023 by Thersa Mohiuddin, Isabella Mao, LPN. Authorization: per the [facility] and Medicare A and B, PA is not required for CPT Code: 40981 Patient notified of PIN (1234) on 11/12/2023 Siddhant Hashemi Notification Method: MyChart message. I attempted to reach the pt Melissa Maldonado her home and cell phones but her home phone only rings 1 time and her cell phone voice mailbox is full.  Phone note routed to covering staff for follow-up.

## 2023-11-15 ENCOUNTER — Encounter: Payer: Self-pay | Admitting: *Deleted

## 2023-11-15 NOTE — Progress Notes (Signed)
 [employer] PREP Weekly Session  Patient Details  Name: Melissa Maldonado MRN: [id number] Date of Birth: 06-Aug-1940 Age: 83 y.o. PCP: Imelda Man, MD  Vitals:   11/15/23 1430  Weight: 201 lb (91.2 kg)     [employer] Weekly seesion - 11/15/23 1500       [employer] "PREP" Location   [facility] "PREP" Location [employer] [facility]      Weekly Session   Topic Discussed Other   Final FIT tesing, Fit ans Strong survey, Questions and Anwers   Minutes exercised this week 75 minutes    Classes attended to date 15             [name] 11/15/2023, 9:04 PM

## 2023-11-18 DIAGNOSIS — E559 Vitamin D deficiency, unspecified: Secondary | ICD-10-CM | POA: Diagnosis not present

## 2023-11-18 DIAGNOSIS — Z7901 Long term (current) use of anticoagulants: Secondary | ICD-10-CM | POA: Diagnosis not present

## 2023-11-18 DIAGNOSIS — R7989 Other specified abnormal findings of blood chemistry: Secondary | ICD-10-CM | POA: Diagnosis not present

## 2023-11-18 DIAGNOSIS — N183 Chronic kidney disease, stage 3 unspecified: Secondary | ICD-10-CM | POA: Diagnosis not present

## 2023-11-18 DIAGNOSIS — I4819 Other persistent atrial fibrillation: Secondary | ICD-10-CM | POA: Diagnosis not present

## 2023-11-18 DIAGNOSIS — E669 Obesity, unspecified: Secondary | ICD-10-CM | POA: Diagnosis not present

## 2023-11-18 DIAGNOSIS — I10 Essential (primary) hypertension: Secondary | ICD-10-CM | POA: Diagnosis not present

## 2023-11-22 ENCOUNTER — Encounter: Payer: Self-pay | Admitting: *Deleted

## 2023-11-23 NOTE — Progress Notes (Signed)
 [employer] PREP Evaluation  Patient Details  Name: Melissa Maldonado MRN: [id number] Date of Birth: 1940-08-29 Age: 83 y.o. PCP: Melissa Man, MD  Vitals:   11/22/23 1200  BP: 138/62  Pulse: (!) 43  Resp: 20  SpO2: 99%  Weight: 200 lb 9.6 oz (91 kg)  Height: 5\' 4"  (1.626 m)     [employer] Eval - 11/22/23 1200       [employer] "PREP" Location   [facility] "PREP" Location [name]      Referral    Referring Provider Melissa Maldonado    Reason for referral Hypertension;High Cholesterol;Other   A-Fib   Program Start Date 08/30/23    Program End Date 11/22/23      Measurement   Waist Circumference 43.75 inches    Waist Circumference End Program 44 inches    Hip Circumference 47 inches    Hip Circumference End Program 46.25 inches    Body fat 41.2 percent      Mobility and Daily Activities   I find it easy to walk up or down two or more flights of stairs. 3    I have no trouble taking out the trash. 3    I do housework such as vacuuming and dusting on my own without difficulty. 3    I can easily lift a gallon of milk (8lbs). 3    I can easily walk a mile. 1    I have no trouble reaching into high cupboards or reaching down to pick up something from the floor. 1    I do not have trouble doing out-door work such as Loss adjuster, chartered, raking leaves, or gardening. 1      Mobility and Daily Activities   I feel younger than my age. 2    I feel independent. 4    I feel energetic. 3    I live an active life.  3    I feel strong. 4    I feel healthy. 2    I feel active as other people my age. 3      How fit and strong are you.   Fit and Strong Total Score 36            Past Medical History:  Diagnosis Date   Arthritis    Chronic back pain    lumbar stenosis   Dysrhythmia    Early cataracts, bilateral    GERD (gastroesophageal reflux disease)    occasionally but no meds required   History of blood transfusion    History of colon polyps    Hyperlipidemia    takes Lipitor every other day    Hypertension    takes losartan  daily   Neuropathy    Osteoarthritis of left knee 04/27/2014   Restless leg    Shortness of breath    with exertion   SOB (shortness of breath) 2013   CPET   Tear of medial meniscus of left knee 09/08/2013   Past Surgical History:  Procedure Laterality Date   APPENDECTOMY     BACK SURGERY  07/20/2008   lumb fusion   CARDIOVERSION N/A 01/29/2023   Procedure: CARDIOVERSION;  Surgeon: Melissa Campus, MD;  Location: [facility];  Service: Cardiovascular;  Laterality: N/A;   COLONOSCOPY  2018   EYE SURGERY     both cataracts   KNEE ARTHROSCOPY WITH MEDIAL MENISECTOMY Left 09/08/2013   Procedure: LEFT KNEE ARTHROSCOPY WITH PARTIAL MEDIAL MENISCECTOMY;  Surgeon: Melissa Barbone, MD;  Location: [facility];  Service: Orthopedics;  Laterality: Left;   LUMBAR LAMINECTOMY/DECOMPRESSION MICRODISCECTOMY  05/13/2012   Procedure: LUMBAR LAMINECTOMY/DECOMPRESSION MICRODISCECTOMY 1 LEVEL;  Surgeon: Melissa Haggis, MD;  Location: [facility];  Service: Neurosurgery;  Laterality: Bilateral;  Bilateral Lumbar one -two Decompressive Laminectomy   PARTIAL KNEE ARTHROPLASTY Left 04/27/2014   Procedure: LEFT UNICOMPARTMENTAL KNEE;  Surgeon: Melissa Barbone, MD;  Location: [facility];  Service: Orthopedics;  Laterality: Left;   TOTAL KNEE ARTHROPLASTY Right 11/06/2022   Procedure: RIGHT TOTAL KNEE ARTHROPLASTY;  Surgeon: Melissa Lao, MD;  Location: [facility];  Service: Orthopedics;  Laterality: Right;   TUBAL LIGATION  1974   UPPER GI ENDOSCOPY     Social History   Tobacco Use  Smoking Status Never  Smokeless Tobacco Never    Melissa Maldonado 11/23/2023, 9:45 AM  PREP Program complete. Attended 11 of 11 education classes and 11 of 12 exercise sessions. Fit testing improved in 12 weeks. 2 minute cardio march 250 to 265, 30 second Sit to Stands 10 to 11, 30 second Bicep curls 18 to 21. Weight loss 4.4lbs. Waist/hip inches lost  0/0.75". Melissa Maldonado was a very engaged participant. She has been active at exercise all her life and has maintained great mobility and strength because of it. I have no doubt she will continue on this functional path. We did discuss continuing to work on healthy eating and portion control to facilitate some further weight loss and BP control. Keep up the good work Melissa Maldonado!

## 2023-11-27 ENCOUNTER — Other Ambulatory Visit: Payer: Self-pay | Admitting: Physician Assistant

## 2023-11-27 DIAGNOSIS — I480 Paroxysmal atrial fibrillation: Secondary | ICD-10-CM

## 2023-11-27 DIAGNOSIS — I4819 Other persistent atrial fibrillation: Secondary | ICD-10-CM

## 2023-11-27 DIAGNOSIS — D6859 Other primary thrombophilia: Secondary | ICD-10-CM

## 2023-11-30 ENCOUNTER — Telehealth: Payer: Self-pay | Admitting: Family

## 2023-11-30 DIAGNOSIS — D6859 Other primary thrombophilia: Secondary | ICD-10-CM

## 2023-11-30 DIAGNOSIS — I480 Paroxysmal atrial fibrillation: Secondary | ICD-10-CM

## 2023-11-30 DIAGNOSIS — I4819 Other persistent atrial fibrillation: Secondary | ICD-10-CM

## 2023-11-30 MED ORDER — RIVAROXABAN 15 MG PO TABS: 15.0000 mg | ORAL_TABLET | Freq: Every day | ORAL | 1 refills | Status: DC

## 2023-11-30 NOTE — Telephone Encounter (Signed)
*  STAT* If patient is at the pharmacy, call can be transferred to refill team.   1. Which medications need to be refilled? (please list name of each medication and dose if known)   Rivaroxaban  (XARELTO ) 15 MG TABS tablet    4. Which pharmacy/location (including street and city if local pharmacy) is medication to be sent to? [id number] - [place], [place] - [address]    5. Do they need a 30 day or 90 day supply? 90

## 2023-11-30 NOTE — Telephone Encounter (Signed)
 Prescription refill request for Xarelto  received.  Indication:Afib  Last office visit: 10/29/23 Otho Blitz)  Weight: 91kg Age: 83 Scr: 1.71 (11/04/23)  CrCl: 37.69ml/min  Appropriate dose. Refill sent.

## 2023-12-06 ENCOUNTER — Ambulatory Visit (HOSPITAL_COMMUNITY)
Admission: RE | Admit: 2023-12-06 | Discharge: 2023-12-06 | Disposition: A | Discharge status: Home / Self Care | Payer: Medicare Other | Source: Ambulatory Visit | Attending: Internal Medicine | Admitting: Internal Medicine

## 2023-12-06 VITALS — BP 122/64 | HR 58 | Ht 64.0 in | Wt 196.6 lb

## 2023-12-06 DIAGNOSIS — I4891 Unspecified atrial fibrillation: Secondary | ICD-10-CM | POA: Diagnosis not present

## 2023-12-06 DIAGNOSIS — I4819 Other persistent atrial fibrillation: Secondary | ICD-10-CM | POA: Diagnosis not present

## 2023-12-06 DIAGNOSIS — D6869 Other thrombophilia: Secondary | ICD-10-CM | POA: Diagnosis not present

## 2023-12-06 NOTE — Patient Instructions (Addendum)
 Call with update of your medication list [phone] option 2 Vernestine Brodhead RN

## 2023-12-06 NOTE — Progress Notes (Signed)
 Primary Care Physician: Imelda Man, MD Primary Cardiologist: Lauro Portal, MD Electrophysiologist: Will Cortland Ding, MD     Referring Physician: Neomi Banks, NP     Melissa Maldonado is a 83 y.o. female with a history of HTN, obesity, and persistent atrial fibrillation who presents for consultation in the [facility].  The patient was initially diagnosed with atrial fibrillation at time of right knee surgery. She does not appear to have cardiac awareness of Afib. Patient is on Xarelto  15 mg for a CHADS2VASC score of 4.  On evaluation today, she is currently in rate controlled Afib. She has no cardiac awareness of Afib. Husband notes she snores and makes abnormal sounds while asleep sometimes. She has not missed any doses of Xarelto .  On follow up 02/19/23, she is currently in NSR. She admits that overall she does not feel that different compared to before cardioversion. S/p successful DCCV on 01/29/23 with conversion to NSR x 3 shocks. She has not missed any doses of Xarelto  15 mg. She is postponing sleep study until her knee is improved.   On follow up 06/07/23, she is currently in NSR. Since last office visit, she has had no episodes of Afib. She did purchase a Kardiamobile device and brought it to show me. No bleeding issues on Xarelto .   On follow up 12/06/23, she is currently in Afib. She is surprised today to find out she is out of rhythm. She has not been using her Kardiamobile device. Seen by Dr. Lawana Pray for consideration of PPM; stress test was normal (HR elevated appropriately with exercise) so no indication for PPM. No bleeding issues on Xarelto .  Today, she denies symptoms of palpitations, chest pain, shortness of breath, orthopnea, PND, lower extremity edema, dizziness, presyncope, syncope, snoring, daytime somnolence, bleeding, or neurologic sequela. The patient is tolerating medications without difficulties and is otherwise without complaint  today.    Atrial Fibrillation Risk Factors:  she does have symptoms or diagnosis of sleep apnea.   she has a BMI of Body mass index is 33.75 kg/m.Melissa Maldonado Filed Weights   12/06/23 0927  Weight: 89.2 kg    Current Outpatient Medications  Medication Sig Dispense Refill   acetaminophen  (TYLENOL ) 500 MG tablet Take 1,000 mg by mouth as needed for moderate pain (pain score 4-6) or mild pain (pain score 1-3).     amLODipine  (NORVASC ) 5 MG tablet Take 5 mg by mouth daily.     chlorthalidone (HYGROTON) 25 MG tablet Take 25 mg by mouth daily.     dorzolamide  (TRUSOPT ) 2 % ophthalmic solution Place 1 drop into the left eye 2 (two) times daily.     furosemide  (LASIX ) 20 MG tablet Take 1 tablet once daily for the next 3 days and then take one tablet daily as needed for swelling 30 tablet 3   Rivaroxaban  (XARELTO ) 15 MG TABS tablet Take 1 tablet (15 mg total) by mouth daily with supper. 90 tablet 1   rOPINIRole  (REQUIP ) 0.5 MG tablet Take 0.5 mg by mouth at bedtime. 1-3 hours before bedtime     telmisartan (MICARDIS) 40 MG tablet Take 40 mg by mouth daily.     No current facility-administered medications for this encounter.    Atrial Fibrillation Management history:  Previous antiarrhythmic drugs: None Previous cardioversions: 01/29/23 Previous ablations: None Anticoagulation history: Xarelto  15 mg   ROS- All systems are reviewed and negative except as per the HPI above.  Physical Exam: BP 122/64   Pulse Melissa Maldonado)  58   Ht 5\' 4"  (1.626 m)   Wt 89.2 kg   BMI 33.75 kg/m   GEN- The patient is well appearing, alert and oriented x 3 today.   Neck - no JVD or carotid bruit noted Lungs- Clear to ausculation bilaterally, normal work of breathing Heart- Irregular rate and rhythm, no murmurs, rubs or gallops, PMI not laterally displaced Extremities- no clubbing, cyanosis, or edema Skin - no rash or ecchymosis noted    EKG today demonstrates  Vent. rate 58 BPM PR interval * ms QRS duration 84  ms QT/QTcB 416/408 ms P-R-T axes * 4 49 Atrial fibrillation with slow ventricular response Low voltage QRS Cannot rule out Anterior infarct , age undetermined Abnormal ECG When compared with ECG of 24-Sep-2023 12:02, Atrial fibrillation has replaced Sinus rhythm  Echo 09/28/22 demonstrated   1. Left ventricular ejection fraction, by estimation, is 60 to 65%. The  left ventricle has normal function. The left ventricle has no regional  wall motion abnormalities. Left ventricular diastolic function could not  be evaluated.   2. Right ventricular systolic function is normal. The right ventricular  size is normal. There is normal pulmonary artery systolic pressure.   3. Right atrial size was mildly dilated.   4. The mitral valve is normal in structure. Trivial mitral valve  regurgitation. No evidence of mitral stenosis.   5. The aortic valve is tricuspid. Aortic valve regurgitation is not  visualized. No aortic stenosis is present.   6. The inferior vena cava is normal in size with greater than 50%  respiratory variability, suggesting right atrial pressure of 3 mmHg.   Cardiac monitor 2/23-09/19/22: Atrial Fibrillation occurred continuously (100% burden), ranging from 44-185 bpm (avg of 86 bpm). 1 Pause occurred lasting 4.3 secs (14 bpm). Isolated VEs were rare (<1.0%), VE Couplets were rare (<1.0%), and no VE Triplets were present.   1. AFIB 100% 2. Tachy/Brady (44-185) 3. One pause 4.3 seconds   ASSESSMENT & PLAN CHA2DS2-VASc Score = 4  The patient's score is based upon: CHF History: 0 HTN History: 1 Diabetes History: 0 Stroke History: 0 Vascular Disease History: 0 Age Score: 2 Gender Score: 1      ASSESSMENT AND PLAN: Persistent Atrial Fibrillation (ICD10:  I48.19) The patient's CHA2DS2-VASc score is 4, indicating a 4.8% annual risk of stroke.   S/p successful DCCV on 01/29/23.   She is currently in Afib. She is completely asymptomatic. We discussed repeat cardioversion  versus remaining rate controlled. Patient appears surprised and a little upset today to find out she is in Afib. She tells me today she is "doctored out" having recently seen a lot of providers. She would like to think over this decision and see me back in 6 months. I advised if patient wishes to proceed with intervention sooner it is okay to call us  back.   Secondary Hypercoagulable State (ICD10:  D68.69) The patient is at significant risk for stroke/thromboembolism based upon her CHA2DS2-VASc Score of 4.  Continue Rivaroxaban  (Xarelto ).  No missed doses.    Follow up 6 months Afib clinic.    Melissa Amber, PA-C  Afib Clinic [facility] [address] [place], [place] [address] [phone]

## 2023-12-16 DIAGNOSIS — R609 Edema, unspecified: Secondary | ICD-10-CM | POA: Diagnosis not present

## 2023-12-16 DIAGNOSIS — I4891 Unspecified atrial fibrillation: Secondary | ICD-10-CM | POA: Diagnosis not present

## 2023-12-16 DIAGNOSIS — N2889 Other specified disorders of kidney and ureter: Secondary | ICD-10-CM | POA: Diagnosis not present

## 2023-12-16 DIAGNOSIS — D631 Anemia in chronic kidney disease: Secondary | ICD-10-CM | POA: Diagnosis not present

## 2023-12-16 DIAGNOSIS — N1832 Chronic kidney disease, stage 3b: Secondary | ICD-10-CM | POA: Diagnosis not present

## 2023-12-16 DIAGNOSIS — N2581 Secondary hyperparathyroidism of renal origin: Secondary | ICD-10-CM | POA: Diagnosis not present

## 2023-12-16 DIAGNOSIS — I129 Hypertensive chronic kidney disease with stage 1 through stage 4 chronic kidney disease, or unspecified chronic kidney disease: Secondary | ICD-10-CM | POA: Diagnosis not present

## 2023-12-24 ENCOUNTER — Telehealth: Payer: Self-pay | Admitting: Cardiovascular Disease

## 2023-12-24 NOTE — Telephone Encounter (Signed)
 Patient states she was returning call. Please advise  ?

## 2024-01-12 ENCOUNTER — Ambulatory Visit (INDEPENDENT_AMBULATORY_CARE_PROVIDER_SITE_OTHER): Payer: Self-pay | Admitting: Orthopaedic Surgery

## 2024-01-12 ENCOUNTER — Other Ambulatory Visit (INDEPENDENT_AMBULATORY_CARE_PROVIDER_SITE_OTHER): Payer: Self-pay

## 2024-01-12 ENCOUNTER — Encounter: Payer: Self-pay | Admitting: Orthopaedic Surgery

## 2024-01-12 DIAGNOSIS — Z96651 Presence of right artificial knee joint: Secondary | ICD-10-CM

## 2024-01-12 DIAGNOSIS — Z96652 Presence of left artificial knee joint: Secondary | ICD-10-CM

## 2024-01-12 NOTE — Progress Notes (Signed)
 The patient is an active 83 year old female who has a history of a left medial compartment unicompartmental knee arthroplasty performed in 2015 and we replaced her right knee in April 2024 which was a full replacement.  She still has some lateral numbness but overall feels like the right knee is doing well.  She says the left knee still has no issues.  The left knee noticeably inflated with no effusion.  There is no significant patellofemoral crepitation and no medial or lateral joint tenderness.  The right knee does flex past 90 degrees to about just past 100 but no more after that.  Extension is full.  Her incisions are well-healed the knee feels ligaments stable with no effusion.  There is numbness over the lateral aspect of her knee.  X-rays of both knees shows well-seated right total knee arthroplasty and a left partial knee arthroplasty at the medial compartment.  At this point since she is doing well follow-up can be as needed.  If she does develop issues she knows to let us  know.

## 2024-01-24 ENCOUNTER — Ambulatory Visit: Payer: BC Managed Care – PPO | Admitting: Orthopaedic Surgery

## 2024-03-19 ENCOUNTER — Emergency Department (HOSPITAL_BASED_OUTPATIENT_CLINIC_OR_DEPARTMENT_OTHER)

## 2024-03-19 ENCOUNTER — Emergency Department (HOSPITAL_BASED_OUTPATIENT_CLINIC_OR_DEPARTMENT_OTHER)
Admission: EM | Admit: 2024-03-19 | Discharge: 2024-03-19 | Disposition: A | Discharge status: Home / Self Care | Attending: Emergency Medicine | Admitting: Emergency Medicine

## 2024-03-19 ENCOUNTER — Other Ambulatory Visit: Payer: Self-pay

## 2024-03-19 ENCOUNTER — Encounter (HOSPITAL_BASED_OUTPATIENT_CLINIC_OR_DEPARTMENT_OTHER): Payer: Self-pay

## 2024-03-19 DIAGNOSIS — R42 Dizziness and giddiness: Secondary | ICD-10-CM | POA: Insufficient documentation

## 2024-03-19 DIAGNOSIS — H9203 Otalgia, bilateral: Secondary | ICD-10-CM | POA: Insufficient documentation

## 2024-03-19 LAB — CBC WITH DIFFERENTIAL/PLATELET
Abs Immature Granulocytes: 0.01 K/uL (ref 0.00–0.07)
Basophils Absolute: 0.1 K/uL (ref 0.0–0.1)
Basophils Relative: 1 %
Eosinophils Absolute: 0.2 K/uL (ref 0.0–0.5)
Eosinophils Relative: 3 %
HCT: 37.3 % (ref 36.0–46.0)
Hemoglobin: 12.4 g/dL (ref 12.0–15.0)
Immature Granulocytes: 0 %
Lymphocytes Relative: 26 %
Lymphs Abs: 1.8 K/uL (ref 0.7–4.0)
MCH: 30 pg (ref 26.0–34.0)
MCHC: 33.2 g/dL (ref 30.0–36.0)
MCV: 90.1 fL (ref 80.0–100.0)
Monocytes Absolute: 0.7 K/uL (ref 0.1–1.0)
Monocytes Relative: 10 %
Neutro Abs: 4.3 K/uL (ref 1.7–7.7)
Neutrophils Relative %: 60 %
Platelets: 292 K/uL (ref 150–400)
RBC: 4.14 MIL/uL (ref 3.87–5.11)
RDW: 12.9 % (ref 11.5–15.5)
WBC: 7.1 K/uL (ref 4.0–10.5)
nRBC: 0 % (ref 0.0–0.2)

## 2024-03-19 LAB — URINALYSIS, W/ REFLEX TO CULTURE (INFECTION SUSPECTED)
Bacteria, UA: NONE SEEN
Bilirubin Urine: NEGATIVE
Glucose, UA: NEGATIVE mg/dL
Hgb urine dipstick: NEGATIVE
Ketones, ur: NEGATIVE mg/dL
Leukocytes,Ua: NEGATIVE
Nitrite: NEGATIVE
Protein, ur: NEGATIVE mg/dL
Specific Gravity, Urine: 1.006 (ref 1.005–1.030)
pH: 6.5 (ref 5.0–8.0)

## 2024-03-19 LAB — BASIC METABOLIC PANEL WITH GFR
Anion gap: 12 (ref 5–15)
BUN: 21 mg/dL (ref 8–23)
CO2: 24 mmol/L (ref 22–32)
Calcium: 9.7 mg/dL (ref 8.9–10.3)
Chloride: 101 mmol/L (ref 98–111)
Creatinine, Ser: 1.64 mg/dL — ABNORMAL HIGH (ref 0.44–1.00)
GFR, Estimated: 31 mL/min — ABNORMAL LOW (ref >60)
Glucose, Bld: 95 mg/dL (ref 70–99)
Potassium: 4.1 mmol/L (ref 3.5–5.1)
Sodium: 138 mmol/L (ref 135–145)

## 2024-03-19 MED ORDER — SODIUM CHLORIDE 0.9 % IV BOLUS
500.0000 mL | Freq: Once | INTRAVENOUS | Status: AC
  Administered 2024-03-19: 500 mL via INTRAVENOUS

## 2024-03-19 NOTE — ED Triage Notes (Signed)
 Pt reports bilateral ear pain for several days and reports dizziness today.

## 2024-03-19 NOTE — ED Notes (Signed)
 RN reviewed discharge instructions with pt. Pt verbalized understanding and had no further questions. VSS upon discharge.

## 2024-03-19 NOTE — ED Provider Notes (Signed)
 [place] EMERGENCY DEPARTMENT AT [facility] Provider Note   CSN: [id number] Arrival date & time: 03/19/24  1430     Patient presents with: Dizziness and Otalgia   Melissa Maldonado is a 83 y.o. female.   Patient is a 83 year old female with a history of hypertension, hyperlipidemia, osteoarthritis, atrial fibrillation on Xarelto  who presents with dizziness.  She states that when she got up out of bed this morning she felt a little dizzy.  She describes it as a lightheadedness but feels little bit of a sense of motion.  She denies any overt spinning sensation.  It seems to be worse when she stands up.  Not related to head movement.  She denies any numbness or weakness to her extremities.  No vision changes or speech deficits.  She has a mild headache.  She checked her blood pressure and it was about 140/70.  She said it was intermittent this morning but she currently does not have any dizziness.  She feels like she has had some similar symptoms in the past.  She has had some ear discomfort in both of her ears for the last few days.  She has been using some over-the-counter anesthetic eardrops without improvement in symptoms.  No runny nose or nasal congestion.  No cough or chest congestion.  No chest pain or shortness of breath.  No fevers.  No urinary symptoms.  No recent head trauma.       Prior to Admission medications   Medication Sig Start Date End Date Taking? Authorizing Provider  acetaminophen  (TYLENOL ) 500 MG tablet Take 1,000 mg by mouth as needed for moderate pain (pain score 4-6) or mild pain (pain score 1-3).    [provider]  amLODipine  (NORVASC ) 5 MG tablet Take 5 mg by mouth daily.    [provider]  chlorthalidone (HYGROTON) 25 MG tablet Take 25 mg by mouth daily. 10/12/23   [provider]  dorzolamide  (TRUSOPT ) 2 % ophthalmic solution Place 1 drop into the left eye 2 (two) times daily.    [provider]  furosemide  (LASIX ) 20  MG tablet Take 1 tablet once daily for the next 3 days and then take one tablet daily as needed for swelling 07/15/23   Vannie Reche RAMAN, NP  Rivaroxaban  (XARELTO ) 15 MG TABS tablet Take 1 tablet (15 mg total) by mouth daily with supper. 11/30/23   Court Dorn PARAS, MD  rOPINIRole  (REQUIP ) 0.5 MG tablet Take 0.5 mg by mouth at bedtime. 1-3 hours before bedtime    [provider]  telmisartan (MICARDIS) 40 MG tablet Take 40 mg by mouth daily.    [provider]    Allergies: Aspirin, Vicks vaporub [liniments], and Codeine    Review of Systems  Constitutional:  Negative for chills, diaphoresis, fatigue and fever.  HENT:  Positive for ear pain. Negative for congestion, rhinorrhea and sneezing.   Eyes: Negative.   Respiratory:  Negative for cough, chest tightness and shortness of breath.   Cardiovascular:  Negative for chest pain and leg swelling.  Gastrointestinal:  Negative for abdominal pain, blood in stool, diarrhea, nausea and vomiting.  Genitourinary:  Negative for difficulty urinating, flank pain, frequency and hematuria.  Musculoskeletal:  Positive for arthralgias (From her chronic arthritis). Negative for back pain.  Skin:  Negative for rash.  Neurological:  Positive for dizziness. Negative for speech difficulty, weakness, numbness and headaches.    Updated Vital Signs BP 113/72   Pulse (!) 59   Temp  98.3 F (36.8 C) (Oral)   Resp 14   Ht 5' 5 (1.651 m)   Wt 91.2 kg   SpO2 99%   BMI 33.45 kg/m   Physical Exam Constitutional:      Appearance: She is well-developed.  HENT:     Head: Normocephalic and atraumatic.  Eyes:     Extraocular Movements: Extraocular movements intact.     Pupils: Pupils are equal, round, and reactive to light.     Comments: On initial exam, she had some slight horizontal nystagmus on leftward gaze.  No vertical or rotational nystagmus.  When I rechecked it, it was not there.  Cardiovascular:     Rate and Rhythm: Normal rate and  regular rhythm.     Heart sounds: Normal heart sounds.  Pulmonary:     Effort: Pulmonary effort is normal. No respiratory distress.     Breath sounds: Normal breath sounds. No wheezing or rales.  Chest:     Chest wall: No tenderness.  Abdominal:     General: Bowel sounds are normal.     Palpations: Abdomen is soft.     Tenderness: There is no abdominal tenderness. There is no guarding or rebound.  Musculoskeletal:        General: Normal range of motion.     Cervical back: Normal range of motion and neck supple.  Lymphadenopathy:     Cervical: No cervical adenopathy.  Skin:    General: Skin is warm and dry.     Findings: No rash.  Neurological:     General: No focal deficit present.     Mental Status: She is alert and oriented to person, place, and time.     Comments: Motor 5/5 all extremities Sensation grossly intact to LT all extremities Finger to Nose intact, no pronator drift CN II-XII grossly intact Gait normal      (all labs ordered are listed, but only abnormal results are displayed) Labs Reviewed  BASIC METABOLIC PANEL WITH GFR - Abnormal; Notable for the following components:      Result Value   Creatinine, Ser 1.64 (*)    GFR, Estimated 31 (*)    All other components within normal limits  URINALYSIS, W/ REFLEX TO CULTURE (INFECTION SUSPECTED) - Abnormal; Notable for the following components:   Color, Urine COLORLESS (*)    All other components within normal limits  CBC WITH DIFFERENTIAL/PLATELET    EKG: EKG Interpretation Date/Time:  Sunday March 19 2024 16:26:47 EDT Ventricular Rate:  60 PR Interval:    QRS Duration:  90 QT Interval:  441 QTC Calculation: 430 R Axis:   9  Text Interpretation: Atrial fibrillation Low voltage, precordial leads since last tracing no significant change Confirmed by Lenor Hollering [phone]) on 03/19/2024 4:57:55 PM  Radiology: CT Head Wo Contrast Result Date: 03/19/2024 CLINICAL DATA:  dizziness EXAM: CT HEAD WITHOUT  CONTRAST TECHNIQUE: Contiguous axial images were obtained from the base of the skull through the vertex without intravenous contrast. RADIATION DOSE REDUCTION: This exam was performed according to the departmental dose-optimization program which includes automated exposure control, adjustment of the mA and/or kV according to patient size and/or use of iterative reconstruction technique. COMPARISON:  01/19/2022. FINDINGS: Brain: There is periventricular white matter decreased attenuation consistent with small vessel ischemic changes. Gray-white differentiation is preserved. No acute intracranial hemorrhage, mass effect or shift. No hydrocephalus. Vascular: No hyperdense vessel or unexpected calcification. Skull: Normal. Negative for fracture or focal lesion. Sinuses/Orbits: No acute finding. IMPRESSION: Periventricular white matter  changes consistent with chronic small vessel ischemia. No acute intracranial process identified. Electronically Signed   By: Fonda Field M.D.   On: 03/19/2024 16:44     Procedures   Medications Ordered in the ED  sodium chloride  0.9 % bolus 500 mL (0 mLs Intravenous Stopped 03/19/24 1719)                                    Medical Decision Making Amount and/or Complexity of Data Reviewed Labs: ordered. Radiology: ordered.   This patient presents to the ED for concern of dizziness, this involves an extensive number of treatment options, and is a complaint that carries with it a high risk of complications and morbidity.  I considered the following differential and admission for this acute, potentially life threatening condition.  The differential diagnosis includes arrhythmia, ACS, UTI, other infection, sinusitis, anemia, electrolyte abnormality, dehydration  MDM:    Patient is a 83 year old female who has had some ear discomfort for the last few days.  Her ear exam is normal.  I do not see any signs of infection.  Do not see any serous effusion.  She does not have  any bony tenderness around the ear that would be more concerning for mastoiditis.  She had some dizziness earlier in the day but denies any current dizziness.  On initial exam, she had some slight horizontal nystagmus with leftward gaze but when I repeated it, it had resolved.  She does not have any focal neurologic deficits or concerns for stroke.  She does not have a definite vertiginous type symptoms.  Her labs reviewed and are nonconcerning.  Her creatinine is elevated but similar to prior values.  Urine is not consistent with infection.  Head CT does not show any acute abnormality.  EKG shows atrial fibrillation which appears to be chronic for her.  It is a controlled rate.  She was given IV fluids.  She does not have any ongoing dizziness.  She was discharged home in good condition.  She was encouraged to follow-up with her PCP.  She has an appointment next month.  I advised her if the dizziness continues, she should call to arrange an earlier appointment.  Return precautions were given. (Labs, imaging, consults)  Labs: I Ordered, and personally interpreted labs.  The pertinent results include: Mildly elevated creatinine  Imaging Studies ordered: I ordered imaging studies including head CT I independently visualized and interpreted imaging. I agree with the radiologist interpretation  Additional history obtained from chart.  External records from outside source obtained and reviewed including prior notes  Cardiac Monitoring: The patient was maintained on a cardiac monitor.  If on the cardiac monitor, I personally viewed and interpreted the cardiac monitored which showed an underlying rhythm of: A-fib  Reevaluation: After the interventions noted above, I reevaluated the patient and found that they have :improved  Social Determinants of Health:    Disposition: Discharged to home  Co morbidities that complicate the patient evaluation  Past Medical History:  Diagnosis Date   Arthritis     Chronic back pain    lumbar stenosis   Dysrhythmia    Early cataracts, bilateral    GERD (gastroesophageal reflux disease)    occasionally but no meds required   History of blood transfusion    History of colon polyps    Hyperlipidemia    takes Lipitor every other day   Hypertension  takes losartan  daily   Neuropathy    Osteoarthritis of left knee 04/27/2014   Restless leg    Shortness of breath    with exertion   SOB (shortness of breath) 2013   CPET   Tear of medial meniscus of left knee 09/08/2013     Medicines Meds ordered this encounter  Medications   sodium chloride  0.9 % bolus 500 mL    I have reviewed the patients home medicines and have made adjustments as needed  Problem List / ED Course: Problem List Items Addressed This Visit   None Visit Diagnoses       Dizziness    -  Primary     Otalgia of both ears                    Final diagnoses:  Dizziness  Otalgia of both ears    ED Discharge Orders     None          Lenor Hollering, MD 03/19/24 1723

## 2024-03-19 NOTE — Discharge Instructions (Addendum)
 Have close follow-up with your primary care doctor if your dizziness continues.  Return to the emergency room if you have any worsening symptoms.

## 2024-03-24 DIAGNOSIS — Z1231 Encounter for screening mammogram for malignant neoplasm of breast: Secondary | ICD-10-CM | POA: Diagnosis not present

## 2024-03-27 DIAGNOSIS — N1832 Chronic kidney disease, stage 3b: Secondary | ICD-10-CM | POA: Diagnosis not present

## 2024-04-03 DIAGNOSIS — N2889 Other specified disorders of kidney and ureter: Secondary | ICD-10-CM | POA: Diagnosis not present

## 2024-04-03 DIAGNOSIS — R609 Edema, unspecified: Secondary | ICD-10-CM | POA: Diagnosis not present

## 2024-04-03 DIAGNOSIS — E785 Hyperlipidemia, unspecified: Secondary | ICD-10-CM | POA: Diagnosis not present

## 2024-04-03 DIAGNOSIS — I129 Hypertensive chronic kidney disease with stage 1 through stage 4 chronic kidney disease, or unspecified chronic kidney disease: Secondary | ICD-10-CM | POA: Diagnosis not present

## 2024-04-03 DIAGNOSIS — N1832 Chronic kidney disease, stage 3b: Secondary | ICD-10-CM | POA: Diagnosis not present

## 2024-04-03 DIAGNOSIS — D631 Anemia in chronic kidney disease: Secondary | ICD-10-CM | POA: Diagnosis not present

## 2024-04-03 DIAGNOSIS — N2581 Secondary hyperparathyroidism of renal origin: Secondary | ICD-10-CM | POA: Diagnosis not present

## 2024-04-03 DIAGNOSIS — I4891 Unspecified atrial fibrillation: Secondary | ICD-10-CM | POA: Diagnosis not present

## 2024-04-20 DIAGNOSIS — H04123 Dry eye syndrome of bilateral lacrimal glands: Secondary | ICD-10-CM | POA: Diagnosis not present

## 2024-04-20 DIAGNOSIS — H18513 Endothelial corneal dystrophy, bilateral: Secondary | ICD-10-CM | POA: Diagnosis not present

## 2024-04-20 DIAGNOSIS — H40011 Open angle with borderline findings, low risk, right eye: Secondary | ICD-10-CM | POA: Diagnosis not present

## 2024-04-20 DIAGNOSIS — H401220 Low-tension glaucoma, left eye, stage unspecified: Secondary | ICD-10-CM | POA: Diagnosis not present

## 2024-04-28 DIAGNOSIS — N1832 Chronic kidney disease, stage 3b: Secondary | ICD-10-CM | POA: Diagnosis not present

## 2024-05-11 DIAGNOSIS — E7800 Pure hypercholesterolemia, unspecified: Secondary | ICD-10-CM | POA: Diagnosis not present

## 2024-05-11 DIAGNOSIS — E559 Vitamin D deficiency, unspecified: Secondary | ICD-10-CM | POA: Diagnosis not present

## 2024-05-11 DIAGNOSIS — Z0000 Encounter for general adult medical examination without abnormal findings: Secondary | ICD-10-CM | POA: Diagnosis not present

## 2024-05-11 LAB — LAB REPORT - SCANNED
EGFR: 36
TSH: 1.93

## 2024-05-18 ENCOUNTER — Ambulatory Visit: Payer: Self-pay | Admitting: Cardiovascular Disease

## 2024-05-18 DIAGNOSIS — I4819 Other persistent atrial fibrillation: Secondary | ICD-10-CM | POA: Diagnosis not present

## 2024-05-18 DIAGNOSIS — Z7901 Long term (current) use of anticoagulants: Secondary | ICD-10-CM | POA: Diagnosis not present

## 2024-05-18 DIAGNOSIS — N184 Chronic kidney disease, stage 4 (severe): Secondary | ICD-10-CM | POA: Diagnosis not present

## 2024-05-18 DIAGNOSIS — Z0000 Encounter for general adult medical examination without abnormal findings: Secondary | ICD-10-CM | POA: Diagnosis not present

## 2024-05-18 DIAGNOSIS — M159 Polyosteoarthritis, unspecified: Secondary | ICD-10-CM | POA: Diagnosis not present

## 2024-05-18 DIAGNOSIS — G8929 Other chronic pain: Secondary | ICD-10-CM | POA: Diagnosis not present

## 2024-05-18 DIAGNOSIS — I10 Essential (primary) hypertension: Secondary | ICD-10-CM | POA: Diagnosis not present

## 2024-05-18 DIAGNOSIS — M0609 Rheumatoid arthritis without rheumatoid factor, multiple sites: Secondary | ICD-10-CM | POA: Diagnosis not present

## 2024-05-18 DIAGNOSIS — M25512 Pain in left shoulder: Secondary | ICD-10-CM | POA: Diagnosis not present

## 2024-05-22 ENCOUNTER — Encounter: Payer: Self-pay | Admitting: Radiology

## 2024-05-30 ENCOUNTER — Ambulatory Visit (INDEPENDENT_AMBULATORY_CARE_PROVIDER_SITE_OTHER): Admitting: Sports Medicine

## 2024-05-30 ENCOUNTER — Ambulatory Visit (INDEPENDENT_AMBULATORY_CARE_PROVIDER_SITE_OTHER): Payer: Self-pay

## 2024-05-30 ENCOUNTER — Other Ambulatory Visit: Payer: Self-pay

## 2024-05-30 ENCOUNTER — Ambulatory Visit (INDEPENDENT_AMBULATORY_CARE_PROVIDER_SITE_OTHER): Admitting: Orthopaedic Surgery

## 2024-05-30 DIAGNOSIS — G8929 Other chronic pain: Secondary | ICD-10-CM

## 2024-05-30 DIAGNOSIS — M25512 Pain in left shoulder: Secondary | ICD-10-CM

## 2024-05-30 MED ORDER — METHYLPREDNISOLONE ACETATE 40 MG/ML IJ SUSP
60.0000 mg | INTRAMUSCULAR | Status: AC | PRN
  Administered 2024-05-30: 60 mg via INTRA_ARTICULAR

## 2024-05-30 MED ORDER — BUPIVACAINE HCL 0.25 % IJ SOLN
2.0000 mL | INTRAMUSCULAR | Status: AC | PRN
  Administered 2024-05-30: 2 mL via INTRA_ARTICULAR

## 2024-05-30 MED ORDER — LIDOCAINE HCL 1 % IJ SOLN
2.0000 mL | INTRAMUSCULAR | Status: AC | PRN
  Administered 2024-05-30: 2 mL

## 2024-05-30 NOTE — Progress Notes (Signed)
   Procedure Note  Patient: Melissa Maldonado             Date of Birth: Oct 26, 1940           MRN: [id number]             Visit Date: 05/30/2024  Procedures: Visit Diagnoses:  1. Chronic left shoulder pain    Large Joint Inj: L glenohumeral on 05/30/2024 8:54 AM Indications: pain Details: 22 G 3.5 in needle, ultrasound-guided posterior approach Medications: 2 mL lidocaine  1 %; 2 mL bupivacaine  0.25 %; 60 mg methylPREDNISolone  acetate 40 MG/ML Outcome: tolerated well, no immediate complications  US -guided glenohumeral joint injection, left shoulder After discussion on risks/benefits/indications, informed verbal consent was obtained. A timeout was then performed. The patient was positioned lying lateral recumbent on examination table. The patient's shoulder was prepped with betadine  and multiple alcohol swabs  and utilizing ultrasound guidance, the patient's glenohumeral joint was identified on ultrasound. Using ultrasound guidance a 22-gauge, 3.5 inch needle with a mixture of 2:2:1.5 cc's lidocaine :bupivicaine:depomedrol was directed from a lateral to medial direction via in-plane technique into the glenohumeral joint with visualization of appropriate spread of injectate into the joint. Patient tolerated the procedure well without immediate complications.      Procedure, treatment alternatives, risks and benefits explained, specific risks discussed. Consent was given by the patient. Immediately prior to procedure a time out was called to verify the correct patient, procedure, equipment, support staff and site/side marked as required. Patient was prepped and draped in the usual sterile fashion.     - patient tolerated procedure well, discussed post-injection protocol - follow-up with Dr. Jerri as indicated for shoulder; I am happy to see them as needed  Lonell Sprang, DO Primary Care Sports Medicine Physician  [facility]  This note was dictated using Dragon naturally  speaking software and may contain errors in syntax, spelling, or content which have not been identified prior to signing this note.

## 2024-05-30 NOTE — Progress Notes (Addendum)
 Office Visit Note   Patient: Melissa Maldonado           Date of Birth: 1940/09/20           MRN: [id number] Visit Date: 05/30/2024              Requested by: Royden Ronal Czar, FNP [address] [place],  [place] [address] PCP: Clarice Nottingham, MD   Assessment & Plan: Visit Diagnoses:  1. Chronic left shoulder pain     Plan: History of Present Illness Melissa Maldonado is an 83 year old female who presents with left shoulder pain and decreased range of motion.  She experiences persistent, constant pain in her left shoulder without any preceding injury, leading to a loss of range of motion. Tylenol  has been ineffective in managing her pain. She is currently on Xarelto , which is relevant for her treatment options.  Physical Exam MUSCULOSKELETAL: Left shoulder external rotation 45 degrees, abduction 85 degrees with pain. Abduction to 135 degrees.  Manual muscle testing is limited due to pain and guarding.  Results RADIOLOGY Left shoulder X-ray: No evidence of rotator cuff pathology, moderately severe glenohumeral osteoarthritis noted.  Assessment and Plan Left shoulder glenohumeral osteoarthritis Chronic left shoulder pain and limited range of motion due to significant arthritis. X-ray confirms arthritis without rotator cuff pathology. Primary focus on arthritis flare-up. - Administered cortisone injection to left shoulder.  Referral sent to Dr. Burnetta - Advised follow-up in 3-4 weeks if no improvement post-injection.  Follow-Up Instructions: No follow-ups on file.   Orders:  Orders Placed This Encounter  Procedures   XR Shoulder Left   No orders of the defined types were placed in this encounter.     Procedures: No procedures performed   Clinical Data: No additional findings.   Subjective: Chief Complaint  Patient presents with   Left Shoulder - Pain    HPI  Review of Systems  Constitutional: Negative.   HENT: Negative.    Eyes: Negative.    Respiratory: Negative.    Cardiovascular: Negative.   Endocrine: Negative.   Musculoskeletal: Negative.   Neurological: Negative.   Hematological: Negative.   Psychiatric/Behavioral: Negative.    All other systems reviewed and are negative.    Objective: Vital Signs: There were no vitals taken for this visit.  Physical Exam Vitals and nursing note reviewed.  Constitutional:      Appearance: She is well-developed.  HENT:     Head: Atraumatic.     Nose: Nose normal.  Eyes:     Extraocular Movements: Extraocular movements intact.  Cardiovascular:     Pulses: Normal pulses.  Pulmonary:     Effort: Pulmonary effort is normal.  Abdominal:     Palpations: Abdomen is soft.  Musculoskeletal:     Cervical back: Neck supple.  Skin:    General: Skin is warm.     Capillary Refill: Capillary refill takes less than 2 seconds.  Neurological:     Mental Status: She is alert. Mental status is at baseline.  Psychiatric:        Behavior: Behavior normal.        Thought Content: Thought content normal.        Judgment: Judgment normal.     Ortho Exam  Specialty Comments:  No specialty comments available.  Imaging: XR Shoulder Left Result Date: 05/30/2024 X-rays of the shoulder show significant degenerative spurring of the glenohumeral joint with significant joint space narrowing.    PMFS History: Patient Active  Problem List   Diagnosis Date Noted   Persistent atrial fibrillation (HCC) 01/25/2023   Hypercoagulable state due to persistent atrial fibrillation (HCC) 01/25/2023   Status post total right knee replacement 11/06/2022   Arthritis of right acromioclavicular joint 08/28/2021   Impingement syndrome of right shoulder 08/19/2021   Nontraumatic incomplete tear of right rotator cuff 08/19/2021   Paresthesia 12/05/2020   Low back pain with right-sided sciatica 12/05/2020   Trochanteric bursitis, right hip 04/24/2020   Essential hypertension 01/12/2020   Sinus  bradycardia 01/12/2020   Dyspnea on exertion 01/12/2020   Lumbar stenosis with neurogenic claudication 02/01/2017   Osteoarthritis of left knee 04/27/2014   Past Medical History:  Diagnosis Date   Arthritis    Chronic back pain    lumbar stenosis   Dysrhythmia    Early cataracts, bilateral    GERD (gastroesophageal reflux disease)    occasionally but no meds required   History of blood transfusion    History of colon polyps    Hyperlipidemia    takes Lipitor every other day   Hypertension    takes losartan  daily   Neuropathy    Osteoarthritis of left knee 04/27/2014   Restless leg    Shortness of breath    with exertion   SOB (shortness of breath) 2013   CPET   Tear of medial meniscus of left knee 09/08/2013    Family History  Problem Relation Age of Onset   Lupus Mother    Heart attack Father     Past Surgical History:  Procedure Laterality Date   APPENDECTOMY     BACK SURGERY  07/20/2008   lumb fusion   CARDIOVERSION N/A 01/29/2023   Procedure: CARDIOVERSION;  Surgeon: Alvan Ronal BRAVO, MD;  Location: [facility];  Service: Cardiovascular;  Laterality: N/A;   COLONOSCOPY  2018   EYE SURGERY     both cataracts   KNEE ARTHROSCOPY WITH MEDIAL MENISECTOMY Left 09/08/2013   Procedure: LEFT KNEE ARTHROSCOPY WITH PARTIAL MEDIAL MENISCECTOMY;  Surgeon: Fonda SHAUNNA Olmsted, MD;  Location: [facility];  Service: Orthopedics;  Laterality: Left;   LUMBAR LAMINECTOMY/DECOMPRESSION MICRODISCECTOMY  05/13/2012   Procedure: LUMBAR LAMINECTOMY/DECOMPRESSION MICRODISCECTOMY 1 LEVEL;  Surgeon: Victory Gens, MD;  Location: [facility];  Service: Neurosurgery;  Laterality: Bilateral;  Bilateral Lumbar one -two Decompressive Laminectomy   PARTIAL KNEE ARTHROPLASTY Left 04/27/2014   Procedure: LEFT UNICOMPARTMENTAL KNEE;  Surgeon: Fonda SHAUNNA Olmsted, MD;  Location: [facility];  Service: Orthopedics;  Laterality: Left;   TOTAL KNEE ARTHROPLASTY Right 11/06/2022    Procedure: RIGHT TOTAL KNEE ARTHROPLASTY;  Surgeon: Vernetta Lonni GRADE, MD;  Location: [facility];  Service: Orthopedics;  Laterality: Right;   TUBAL LIGATION  1974   UPPER GI ENDOSCOPY     Social History   Occupational History   Occupation: Retired  Tobacco Use   Smoking status: Never   Smokeless tobacco: Never  Vaping Use   Vaping status: Never Used  Substance and Sexual Activity   Alcohol use: Not Currently    Comment: socially wine   Drug use: Never   Sexual activity: Yes    Birth control/protection: Post-menopausal

## 2024-05-31 ENCOUNTER — Encounter (HOSPITAL_COMMUNITY): Payer: Self-pay | Admitting: Internal Medicine

## 2024-05-31 ENCOUNTER — Ambulatory Visit (HOSPITAL_COMMUNITY)
Admission: RE | Admit: 2024-05-31 | Discharge: 2024-05-31 | Disposition: A | Discharge status: Home / Self Care | Source: Ambulatory Visit | Attending: Internal Medicine | Admitting: Internal Medicine

## 2024-05-31 VITALS — BP 136/84 | HR 77 | Ht 65.0 in | Wt 206.4 lb

## 2024-05-31 DIAGNOSIS — I4819 Other persistent atrial fibrillation: Secondary | ICD-10-CM | POA: Diagnosis not present

## 2024-05-31 DIAGNOSIS — D6869 Other thrombophilia: Secondary | ICD-10-CM | POA: Insufficient documentation

## 2024-05-31 NOTE — Progress Notes (Signed)
 Primary Care Physician: Clarice Nottingham, MD Primary Cardiologist: Dorn Lesches, MD Electrophysiologist: Will Gladis Norton, MD     Referring Physician: Reche Finder, NP     Melissa Maldonado is a 83 y.o. female with a history of HTN, obesity, and persistent atrial fibrillation who presents for consultation in the [facility].  The patient was initially diagnosed with atrial fibrillation at time of right knee surgery. She does not appear to have cardiac awareness of Afib. Patient is on Xarelto  15 mg for a CHADS2VASC score of 4.  On evaluation today, she is currently in rate controlled Afib. She has no cardiac awareness of Afib. Husband notes she snores and makes abnormal sounds while asleep sometimes. She has not missed any doses of Xarelto .  On follow up 02/19/23, she is currently in NSR. She admits that overall she does not feel that different compared to before cardioversion. S/p successful DCCV on 01/29/23 with conversion to NSR x 3 shocks. She has not missed any doses of Xarelto  15 mg. She is postponing sleep study until her knee is improved.   On follow up 06/07/23, she is currently in NSR. Since last office visit, she has had no episodes of Afib. She did purchase a Kardiamobile device and brought it to show me. No bleeding issues on Xarelto .   On follow up 12/06/23, she is currently in Afib. She is surprised today to find out she is out of rhythm. She has not been using her Kardiamobile device. Seen by Dr. Norton for consideration of PPM; stress test was normal (HR elevated appropriately with exercise) so no indication for PPM. No bleeding issues on Xarelto .  On follow up 05/31/24, patient is currently in Afib. She does not have cardiac awareness. No missed doses of Xarelto .   Today, she denies symptoms of palpitations, chest pain, shortness of breath, orthopnea, PND, lower extremity edema, dizziness, presyncope, syncope, snoring, daytime somnolence, bleeding,  or neurologic sequela. The patient is tolerating medications without difficulties and is otherwise without complaint today.    Atrial Fibrillation Risk Factors:  she does have symptoms or diagnosis of sleep apnea.   she has a BMI of Body mass index is 34.35 kg/m.SABRA Filed Weights   05/31/24 0959  Weight: 93.6 kg     Current Outpatient Medications  Medication Sig Dispense Refill   acetaminophen  (TYLENOL ) 500 MG tablet Take 1,000 mg by mouth as needed for moderate pain (pain score 4-6) or mild pain (pain score 1-3).     amLODipine  (NORVASC ) 5 MG tablet Take 5 mg by mouth daily.     dorzolamide  (TRUSOPT ) 2 % ophthalmic solution Place 1 drop into the left eye 2 (two) times daily.     furosemide  (LASIX ) 20 MG tablet Take 1 tablet once daily for the next 3 days and then take one tablet daily as needed for swelling 30 tablet 3   Rivaroxaban  (XARELTO ) 15 MG TABS tablet Take 1 tablet (15 mg total) by mouth daily with supper. 90 tablet 1   rOPINIRole  (REQUIP ) 0.5 MG tablet Take 0.5 mg by mouth at bedtime. 1-3 hours before bedtime     telmisartan (MICARDIS) 40 MG tablet Take 40 mg by mouth daily.     No current facility-administered medications for this encounter.    Atrial Fibrillation Management history:  Previous antiarrhythmic drugs: None Previous cardioversions: 01/29/23 Previous ablations: None Anticoagulation history: Xarelto  15 mg   ROS- All systems are reviewed and negative except as per the HPI above.  Physical Exam: BP 136/84   Pulse 77   Ht 5' 5 (1.651 m)   Wt 93.6 kg   BMI 34.35 kg/m   GEN- The patient is well appearing, alert and oriented x 3 today.   Neck - no JVD or carotid bruit noted Lungs- Clear to ausculation bilaterally, normal work of breathing Heart- Irregular rate and rhythm, no murmurs, rubs or gallops, PMI not laterally displaced Extremities- no clubbing, cyanosis, or edema Skin - no rash or ecchymosis noted   EKG today demonstrates  Vent. rate 77  BPM PR interval * ms QRS duration 82 ms QT/QTcB 342/387 ms P-R-T axes * 8 50 Atrial fibrillation Possible Anterior infarct , age undetermined Abnormal ECG When compared with ECG of 19-Mar-2024 16:26, PREVIOUS ECG IS PRESENT  Echo 09/28/22 demonstrated   1. Left ventricular ejection fraction, by estimation, is 60 to 65%. The  left ventricle has normal function. The left ventricle has no regional  wall motion abnormalities. Left ventricular diastolic function could not  be evaluated.   2. Right ventricular systolic function is normal. The right ventricular  size is normal. There is normal pulmonary artery systolic pressure.   3. Right atrial size was mildly dilated.   4. The mitral valve is normal in structure. Trivial mitral valve  regurgitation. No evidence of mitral stenosis.   5. The aortic valve is tricuspid. Aortic valve regurgitation is not  visualized. No aortic stenosis is present.   6. The inferior vena cava is normal in size with greater than 50%  respiratory variability, suggesting right atrial pressure of 3 mmHg.   Cardiac monitor 2/23-09/19/22: Atrial Fibrillation occurred continuously (100% burden), ranging from 44-185 bpm (avg of 86 bpm). 1 Pause occurred lasting 4.3 secs (14 bpm). Isolated VEs were rare (<1.0%), VE Couplets were rare (<1.0%), and no VE Triplets were present.   1. AFIB 100% 2. Tachy/Brady (44-185) 3. One pause 4.3 seconds   ASSESSMENT & PLAN CHA2DS2-VASc Score = 4  The patient's score is based upon: CHF History: 0 HTN History: 1 Diabetes History: 0 Stroke History: 0 Vascular Disease History: 0 Age Score: 2 Gender Score: 1      ASSESSMENT AND PLAN: Persistent Atrial Fibrillation (ICD10:  I48.19) The patient's CHA2DS2-VASc score is 4, indicating a 4.8% annual risk of stroke.   S/p successful DCCV on 01/29/23.   Patient is currently in Afib.  We discussed rate control versus rhythm control strategy for patient's A-fib.  After discussion,  patient declines further intervention or antiarrhythmic medication.  She wishes to remain with a rate control strategy.  She does not have cardiac awareness of A-fib.  This is a reasonable strategy to continue.  Secondary Hypercoagulable State (ICD10:  D68.69) The patient is at significant risk for stroke/thromboembolism based upon her CHA2DS2-VASc Score of 4.  Continue Rivaroxaban  (Xarelto ).  No missed doses. Emphasized compliance and she has not missed a dose; takes it with dinner every evening.    Follow up 1 year Afib clinic.    Terra Pac, PA-C  [facility] [address] [place], [place] [address] [phone]

## 2024-06-06 ENCOUNTER — Telehealth: Payer: Self-pay | Admitting: Sports Medicine

## 2024-06-06 NOTE — Telephone Encounter (Signed)
 Pt called stating she had an injection with Burnetta and it did not work and asking to be seen right away. Please call pt at [phone]

## 2024-06-06 NOTE — Telephone Encounter (Signed)
 Called and spoke with patient. She was made aware that it can take up to two weeks for relief. She will hold off on recheck and call if she gets no improvement.

## 2024-06-13 ENCOUNTER — Telehealth: Payer: Self-pay | Admitting: Sports Medicine

## 2024-06-13 NOTE — Telephone Encounter (Signed)
 Patient called and said she just letting you know that the shot didn't work. [phone]

## 2024-06-13 NOTE — Telephone Encounter (Signed)
 Her next option is a shoulder replacement.  It is not something she is interested in?

## 2024-06-18 ENCOUNTER — Other Ambulatory Visit: Payer: Self-pay | Admitting: Cardiovascular Disease

## 2024-06-18 DIAGNOSIS — I480 Paroxysmal atrial fibrillation: Secondary | ICD-10-CM

## 2024-06-19 ENCOUNTER — Ambulatory Visit: Admitting: Sports Medicine

## 2024-06-19 ENCOUNTER — Encounter: Payer: Self-pay | Admitting: Sports Medicine

## 2024-06-19 DIAGNOSIS — G8929 Other chronic pain: Secondary | ICD-10-CM

## 2024-06-19 DIAGNOSIS — M25512 Pain in left shoulder: Secondary | ICD-10-CM | POA: Diagnosis not present

## 2024-06-19 DIAGNOSIS — M75102 Unspecified rotator cuff tear or rupture of left shoulder, not specified as traumatic: Secondary | ICD-10-CM | POA: Diagnosis not present

## 2024-06-19 DIAGNOSIS — M12812 Other specific arthropathies, not elsewhere classified, left shoulder: Secondary | ICD-10-CM

## 2024-06-19 DIAGNOSIS — M19012 Primary osteoarthritis, left shoulder: Secondary | ICD-10-CM

## 2024-06-19 NOTE — Telephone Encounter (Signed)
 Prescription refill request for Xarelto  received.  Indication: a-fib Last office visit: 05/31/2024 Weight: 93.6kg Age: 83 Scr: 1.64 (03/19/2024 EPIC) CrCl: 47  Dose appropriate, refill sent

## 2024-06-19 NOTE — Progress Notes (Signed)
 Melissa Maldonado - 83 y.o. female MRN [id number]  Date of birth: 1940/10/29  Office Visit Note: Visit Date: 06/19/2024 PCP: Clarice Nottingham, MD Referred by: Clarice Nottingham, MD  Subjective: Chief Complaint  Patient presents with  . Left Shoulder - Pain   HPI: Melissa Maldonado is a pleasant 83 y.o. female who presents today for acute on chronic left shoulder pain.  Kechia has been seeing Dr. Jerri for her shoulder which does have advanced osteoarthritic change.  She was sent to me for same-day injection on 05/30/2024 which we did proceed under ultrasound guidance.  She states that for the first 4-6 hours, the shoulder felt great.  She was able to pick up the arm and have minimal pain with range of motion.  Following this however the next day her pain returned and she continued with both pain and restriction in the shoulder.  Dr. Benjiman team did reach out to her, he was considering discussing with her possible shoulder replacement.  She did not receive this communication however.  She is using Tylenol  Exer strength with only mild improvement.  Pertinent ROS were reviewed with the patient and found to be negative unless otherwise specified above in HPI.   Assessment & Plan: Visit Diagnoses:  1. Chronic left shoulder pain   2. Primary osteoarthritis, left shoulder   3. Rotator cuff tear arthropathy of left shoulder    Plan: Impression is acute exacerbation of chronic left shoulder pain which is twofold in nature.  X-rays do show advancing osteoarthritic change of the shoulder.  Her exam today does reveal some degree of passive range of motion restriction, however she has significant active range of motion loss with concern for significant rotator cuff tearing/arthropathy.  She received very good but only very temporary relief from previous intra-articular injection.  At this point, we discussed further evaluating the rotator cuff quality and intra-articular nature with MRI of the shoulder.  Per Dr. Jerri,  she could be a candidate for shoulder replacement, but I would like to evaluate the rotator cuff as I do believe this has quite a bit of pathology/tearing and likely reverse total shoulder would in that case be the next step. Melissa Maldonado would rather avoid surgery if possible, but will discuss this further once MRI returns.  She may continue her Tylenol  Exer strength in the interim.  We will get her working with formalized physical therapy to maintain as much range of motion and strengthening/stabilization of the shoulder as we can in the interim, referral sent today.  Follow-up: Return for f/u 1-week after MRI with myself or Dr. Jerri (she said we can call).   Meds & Orders: No orders of the defined types were placed in this encounter.   Orders Placed This Encounter  Procedures  . MR Shoulder Left w/o contrast  . Ambulatory referral to Physical Therapy     Procedures: No procedures performed      Clinical History: No specialty comments available.  She reports that she has never smoked. She has never used smokeless tobacco. No results for input(s): HGBA1C, LABURIC in the last 8760 hours.  Objective:    Physical Exam  Gen: Well-appearing, in no acute distress; non-toxic CV: Well-perfused. Warm.  Resp: Breathing unlabored on room air; no wheezing. Psych: Fluid speech in conversation; appropriate affect; normal thought process  Ortho Exam - Left shoulder: Trace to mild effusion of the left shoulder.  There is restriction both passively and actively, but I am certainly able to take her  further passively.  Forward flexion 50 degrees actively, 130 degrees passively, abduction 15 degrees actively, 110 degrees passively. + Drop arm test, there is weakness with resisted external rotation and abduction.  Imaging:  *Three-view x-ray of the left shoulder from 05/30/24 including AP, scapular Y and axial view was independent reviewed and interpreted by myself today.  X-rays demonstrate advanced joint  space narrowing with notable OA, there is degenerative spurring and inferior osteophyte of the humeral head.  No acute fracture noted.  Past Medical/Family/Surgical/Social History: Medications & Allergies reviewed per EMR, new medications updated. Patient Active Problem List   Diagnosis Date Noted  . Persistent atrial fibrillation (HCC) 01/25/2023  . Hypercoagulable state due to persistent atrial fibrillation (HCC) 01/25/2023  . Status post total right knee replacement 11/06/2022  . Arthritis of right acromioclavicular joint 08/28/2021  . Impingement syndrome of right shoulder 08/19/2021  . Nontraumatic incomplete tear of right rotator cuff 08/19/2021  . Paresthesia 12/05/2020  . Low back pain with right-sided sciatica 12/05/2020  . Trochanteric bursitis, right hip 04/24/2020  . Essential hypertension 01/12/2020  . Sinus bradycardia 01/12/2020  . Dyspnea on exertion 01/12/2020  . Lumbar stenosis with neurogenic claudication 02/01/2017  . Osteoarthritis of left knee 04/27/2014   Past Medical History:  Diagnosis Date  . Arthritis   . Chronic back pain    lumbar stenosis  . Dysrhythmia   . Early cataracts, bilateral   . GERD (gastroesophageal reflux disease)    occasionally but no meds required  . History of blood transfusion   . History of colon polyps   . Hyperlipidemia    takes Lipitor every other day  . Hypertension    takes losartan  daily  . Neuropathy   . Osteoarthritis of left knee 04/27/2014  . Restless leg   . Shortness of breath    with exertion  . SOB (shortness of breath) 2013   CPET  . Tear of medial meniscus of left knee 09/08/2013   Family History  Problem Relation Age of Onset  . Lupus Mother   . Heart attack Father    Past Surgical History:  Procedure Laterality Date  . APPENDECTOMY    . BACK SURGERY  07/20/2008   lumb fusion  . CARDIOVERSION N/A 01/29/2023   Procedure: CARDIOVERSION;  Surgeon: Alvan Ronal BRAVO, MD;  Location: [facility];   Service: Cardiovascular;  Laterality: N/A;  . COLONOSCOPY  2018  . EYE SURGERY     both cataracts  . KNEE ARTHROSCOPY WITH MEDIAL MENISECTOMY Left 09/08/2013   Procedure: LEFT KNEE ARTHROSCOPY WITH PARTIAL MEDIAL MENISCECTOMY;  Surgeon: Fonda SHAUNNA Olmsted, MD;  Location: [facility];  Service: Orthopedics;  Laterality: Left;  . LUMBAR LAMINECTOMY/DECOMPRESSION MICRODISCECTOMY  05/13/2012   Procedure: LUMBAR LAMINECTOMY/DECOMPRESSION MICRODISCECTOMY 1 LEVEL;  Surgeon: Victory Gens, MD;  Location: [facility];  Service: Neurosurgery;  Laterality: Bilateral;  Bilateral Lumbar one -two Decompressive Laminectomy  . PARTIAL KNEE ARTHROPLASTY Left 04/27/2014   Procedure: LEFT UNICOMPARTMENTAL KNEE;  Surgeon: Fonda SHAUNNA Olmsted, MD;  Location:  SURGERY CENTER;  Service: Orthopedics;  Laterality: Left;  . TOTAL KNEE ARTHROPLASTY Right 11/06/2022   Procedure: RIGHT TOTAL KNEE ARTHROPLASTY;  Surgeon: Vernetta Lonni GRADE, MD;  Location: [facility];  Service: Orthopedics;  Laterality: Right;  . TUBAL LIGATION  1974  . UPPER GI ENDOSCOPY     Social History   Occupational History  . Occupation: Retired  Tobacco Use  . Smoking status: Never  . Smokeless tobacco: Never  .  Tobacco comments:    Never smoked 05/31/24  Vaping Use  . Vaping status: Never Used  Substance and Sexual Activity  . Alcohol use: Not Currently    Comment: socially wine  . Drug use: Never  . Sexual activity: Yes    Birth control/protection: Post-menopausal

## 2024-06-19 NOTE — Progress Notes (Signed)
 Patient says that she got no relief from the shoulder injection. She says that she feels worse, as she is now more sore and has soreness through the left side of her neck.

## 2024-06-22 NOTE — Therapy (Incomplete)
 OUTPATIENT PHYSICAL THERAPY UPPER EXTREMITY EVALUATION   Patient Name: Melissa Maldonado MRN: [id number] DOB:08-23-1940, 83 y.o., female Today's Date: 06/22/2024  END OF SESSION:   Past Medical History:  Diagnosis Date   Arthritis    Chronic back pain    lumbar stenosis   Dysrhythmia    Early cataracts, bilateral    GERD (gastroesophageal reflux disease)    occasionally but no meds required   History of blood transfusion    History of colon polyps    Hyperlipidemia    takes Lipitor every other day   Hypertension    takes losartan  daily   Neuropathy    Osteoarthritis of left knee 04/27/2014   Restless leg    Shortness of breath    with exertion   SOB (shortness of breath) 2013   CPET   Tear of medial meniscus of left knee 09/08/2013   Past Surgical History:  Procedure Laterality Date   APPENDECTOMY     BACK SURGERY  07/20/2008   lumb fusion   CARDIOVERSION N/A 01/29/2023   Procedure: CARDIOVERSION;  Surgeon: Alvan Ronal BRAVO, MD;  Location: [facility];  Service: Cardiovascular;  Laterality: N/A;   COLONOSCOPY  2018   EYE SURGERY     both cataracts   KNEE ARTHROSCOPY WITH MEDIAL MENISECTOMY Left 09/08/2013   Procedure: LEFT KNEE ARTHROSCOPY WITH PARTIAL MEDIAL MENISCECTOMY;  Surgeon: Fonda SHAUNNA Olmsted, MD;  Location: [facility];  Service: Orthopedics;  Laterality: Left;   LUMBAR LAMINECTOMY/DECOMPRESSION MICRODISCECTOMY  05/13/2012   Procedure: LUMBAR LAMINECTOMY/DECOMPRESSION MICRODISCECTOMY 1 LEVEL;  Surgeon: Victory Gens, MD;  Location: [facility];  Service: Neurosurgery;  Laterality: Bilateral;  Bilateral Lumbar one -two Decompressive Laminectomy   PARTIAL KNEE ARTHROPLASTY Left 04/27/2014   Procedure: LEFT UNICOMPARTMENTAL KNEE;  Surgeon: Fonda SHAUNNA Olmsted, MD;  Location: [facility];  Service: Orthopedics;  Laterality: Left;   TOTAL KNEE ARTHROPLASTY Right 11/06/2022   Procedure: RIGHT TOTAL KNEE ARTHROPLASTY;  Surgeon: Vernetta Lonni GRADE, MD;  Location: [facility];  Service: Orthopedics;  Laterality: Right;   TUBAL LIGATION  1974   UPPER GI ENDOSCOPY     Patient Active Problem List   Diagnosis Date Noted   Persistent atrial fibrillation (HCC) 01/25/2023   Hypercoagulable state due to persistent atrial fibrillation (HCC) 01/25/2023   Status post total right knee replacement 11/06/2022   Arthritis of right acromioclavicular joint 08/28/2021   Impingement syndrome of right shoulder 08/19/2021   Nontraumatic incomplete tear of right rotator cuff 08/19/2021   Paresthesia 12/05/2020   Low back pain with right-sided sciatica 12/05/2020   Trochanteric bursitis, right hip 04/24/2020   Essential hypertension 01/12/2020   Sinus bradycardia 01/12/2020   Dyspnea on exertion 01/12/2020   Lumbar stenosis with neurogenic claudication 02/01/2017   Osteoarthritis of left knee 04/27/2014    PCP: Clarice Nottingham, MD   REFERRING PROVIDER: Burnetta Brunet, DO   REFERRING DIAG:  [phone] (ICD-10-CM) - Chronic left shoulder pain  M19.012 (ICD-10-CM) - Primary osteoarthritis, left shoulder  M75.102,M12.812 (ICD-10-CM) - Rotator cuff tear arthropathy of left shoulder    THERAPY DIAG:  No diagnosis found.  Rationale for Evaluation and Treatment: Rehabilitation  ONSET DATE: ***  SUBJECTIVE:  SUBJECTIVE STATEMENT: *** Hand dominance: {MISC; OT HAND [phone]}  PERTINENT HISTORY: ***  PAIN:  Are you having pain? Yes: NPRS scale: *** Pain location: *** Pain description: *** Aggravating factors: *** Relieving factors: ***  PRECAUTIONS: {Therapy precautions:24002}  RED FLAGS: {PT Red Flags:29287}   WEIGHT BEARING RESTRICTIONS: {Yes ***/No:24003}  FALLS:  Has patient fallen in last 6 months? {fallsyesno:27318}  LIVING  ENVIRONMENT: Lives with: {OPRC lives with:25569::lives with their family} Lives in: {Lives in:25570} Stairs: {opstairs:27293} Has following equipment at home: {Assistive devices:23999}  OCCUPATION: ***  PLOF: {PLOF:24004}  PATIENT GOALS: ***  NEXT MD VISIT: ***  OBJECTIVE:  Note: Objective measures were completed at Evaluation unless otherwise noted.  DIAGNOSTIC FINDINGS:  05/30/24 X-rays of the shoulder show significant degenerative spurring of the  glenohumeral joint with significant joint space narrowing.   PATIENT SURVEYS :  PSFS: THE PATIENT SPECIFIC FUNCTIONAL SCALE  Place score of 0-10 (0 = unable to perform activity and 10 = able to perform activity at the same level as before injury or problem)  Activity Date: 06/22/24         2.     3.     4.      Total Score ***      Total Score = Sum of activity scores/number of activities  Minimally Detectable Change: 3 points (for single activity); 2 points (for average score)  [facility] (nd). The Patient Specific Functional Scale . Retrieved from [email or web address].[email or web address]   COGNITION: Overall cognitive status: Within functional limits for tasks assessed     SENSATION: {sensation:27233}  POSTURE: ***  UPPER EXTREMITY ROM:   Active/Passive ROM Right eval Left eval  Shoulder flexion  ***  Shoulder extension    Shoulder abduction    Shoulder adduction    Shoulder internal rotation    Shoulder external rotation    Elbow flexion    Elbow extension    Wrist flexion    Wrist extension    Wrist ulnar deviation    Wrist radial deviation    Wrist pronation    Wrist supination    (Blank rows = not tested)  UPPER EXTREMITY MMT:  MMT Right eval Left eval  Shoulder flexion  ***  Shoulder extension    Shoulder abduction    Shoulder adduction    Shoulder internal rotation    Shoulder external rotation    Middle trapezius    Lower trapezius     Elbow flexion    Elbow extension    Wrist flexion    Wrist extension    Wrist ulnar deviation    Wrist radial deviation    Wrist pronation    Wrist supination    Grip strength (lbs)    (Blank rows = not tested)  SHOULDER SPECIAL TESTS: Impingement tests: {shoulder impingement test:25231:a} SLAP lesions: {SLAP lesions:25232} Instability tests: {shoulder instability test:25233} Rotator cuff assessment: {rotator cuff assessment:25234} Biceps assessment: {biceps assessment:25235}  JOINT MOBILITY TESTING:  ***  PALPATION:  ***  TREATMENT DATE: ***   PATIENT EDUCATION: Education details: HEP Person educated: Patient Education method: Programmer, Multimedia, Demonstration, Actor cues, and Verbal cues Education comprehension: {Education Comprehension:25206}  HOME EXERCISE PROGRAM: ***  ASSESSMENT:  CLINICAL IMPRESSION: Patient is a 83 y.o. female who was seen today for physical therapy evaluation and treatment for L shoulder.  She presents with *** Patient would benefit from skilled PT services to address the above deficits and return to previous LOA.   OBJECTIVE IMPAIRMENTS: {opptimpairments:25111}.   ACTIVITY LIMITATIONS: {activitylimitations:27494}  PARTICIPATION LIMITATIONS: {participationrestrictions:25113}  PERSONAL FACTORS: {Personal factors:25162} are also affecting patient's functional outcome.   REHAB POTENTIAL: {rehabpotential:25112}  CLINICAL DECISION MAKING: Stable/uncomplicated  EVALUATION COMPLEXITY: {Evaluation complexity:25115}  GOALS: Goals reviewed with patient? Yes  SHORT TERM GOALS: Target date: ***  Patient to be independent with HEP. Baseline: Goal status: INITIAL  2.  Decrease pain by 1 level. Baseline:  Goal status: INITIAL  3.  *** Baseline:  Goal status: INITIAL  4.  *** Baseline:  Goal status:  {GOALSTATUS:25110}  5.  *** Baseline:  Goal status: {GOALSTATUS:25110}  6.  *** Baseline:  Goal status: {GOALSTATUS:25110}  LONG TERM GOALS: Target date: ***  Patient to be independent with self progressive HEP at discharge. Baseline:  Goal status: INITIAL  2.  Decrease max pain to 2 out of 10 or less with all activities. Baseline:  Goal status: INITIAL  3.  Increase active range of motion by*** Baseline:  Goal status: INITIAL  4.  Increase strength of left upper extremity musculature by half a grade to 1 full grade by discharge. Baseline:  Goal status: INITIAL  5.  Improve score of PAS FS by at least 3 points for measurable improvement. Baseline:  Goal status: INITIAL  6.  *** Baseline:  Goal status: INITIAL  PLAN: PT FREQUENCY: {rehab frequency:25116}  PT DURATION: {rehab duration:25117}  PLANNED INTERVENTIONS: 97164- PT Re-evaluation, 97110-Therapeutic exercises, 97530- Therapeutic activity, 97112- Neuromuscular re-education, 97535- Self Care, 02859- Manual therapy, [id number]- Aquatic Therapy, H9716- Electrical stimulation (unattended), 20560 (1-2 muscles), 20561 (3+ muscles)- Dry Needling, Patient/Family education, Joint mobilization, Cryotherapy, and Moist heat  PLAN FOR NEXT SESSION: ***   Burnard CHRISTELLA Meth, PT 06/22/2024, 7:54 AM

## 2024-06-23 ENCOUNTER — Ambulatory Visit

## 2024-06-23 ENCOUNTER — Encounter: Payer: Self-pay | Admitting: Rehabilitative and Restorative Service Providers"

## 2024-06-23 DIAGNOSIS — R293 Abnormal posture: Secondary | ICD-10-CM

## 2024-06-23 DIAGNOSIS — M25512 Pain in left shoulder: Secondary | ICD-10-CM | POA: Diagnosis not present

## 2024-06-23 DIAGNOSIS — M6281 Muscle weakness (generalized): Secondary | ICD-10-CM

## 2024-06-23 DIAGNOSIS — G8929 Other chronic pain: Secondary | ICD-10-CM

## 2024-06-23 NOTE — Therapy (Signed)
 OUTPATIENT PHYSICAL THERAPY  EVALUATION   Patient Name: Melissa Maldonado MRN: [id number] DOB:12/25/1940, 83 y.o., female Today's Date: 06/23/2024  END OF SESSION:  PT End of Session - 06/23/24 0921     Visit Number 1    Number of Visits 20    Date for Recertification  09/01/24    Authorization Type Medicare    Progress Note Due on Visit 10    PT Start Time 0935    PT Stop Time 1012    PT Time Calculation (min) 37 min    Activity Tolerance Patient limited by pain    Behavior During Therapy [facility] for tasks assessed/performed          Past Medical History:  Diagnosis Date   Arthritis    Chronic back pain    lumbar stenosis   Dysrhythmia    Early cataracts, bilateral    GERD (gastroesophageal reflux disease)    occasionally but no meds required   History of blood transfusion    History of colon polyps    Hyperlipidemia    takes Lipitor every other day   Hypertension    takes losartan  daily   Neuropathy    Osteoarthritis of left knee 04/27/2014   Restless leg    Shortness of breath    with exertion   SOB (shortness of breath) 2013   CPET   Tear of medial meniscus of left knee 09/08/2013   Past Surgical History:  Procedure Laterality Date   APPENDECTOMY     BACK SURGERY  07/20/2008   lumb fusion   CARDIOVERSION N/A 01/29/2023   Procedure: CARDIOVERSION;  Surgeon: Alvan Ronal BRAVO, MD;  Location: [facility];  Service: Cardiovascular;  Laterality: N/A;   COLONOSCOPY  2018   EYE SURGERY     both cataracts   KNEE ARTHROSCOPY WITH MEDIAL MENISECTOMY Left 09/08/2013   Procedure: LEFT KNEE ARTHROSCOPY WITH PARTIAL MEDIAL MENISCECTOMY;  Surgeon: Fonda SHAUNNA Olmsted, MD;  Location: [facility];  Service: Orthopedics;  Laterality: Left;   LUMBAR LAMINECTOMY/DECOMPRESSION MICRODISCECTOMY  05/13/2012   Procedure: LUMBAR LAMINECTOMY/DECOMPRESSION MICRODISCECTOMY 1 LEVEL;  Surgeon: Victory Gens, MD;  Location: [facility];  Service: Neurosurgery;  Laterality:  Bilateral;  Bilateral Lumbar one -two Decompressive Laminectomy   PARTIAL KNEE ARTHROPLASTY Left 04/27/2014   Procedure: LEFT UNICOMPARTMENTAL KNEE;  Surgeon: Fonda SHAUNNA Olmsted, MD;  Location:  SURGERY CENTER;  Service: Orthopedics;  Laterality: Left;   TOTAL KNEE ARTHROPLASTY Right 11/06/2022   Procedure: RIGHT TOTAL KNEE ARTHROPLASTY;  Surgeon: Vernetta Lonni GRADE, MD;  Location: [facility];  Service: Orthopedics;  Laterality: Right;   TUBAL LIGATION  1974   UPPER GI ENDOSCOPY     Patient Active Problem List   Diagnosis Date Noted   Persistent atrial fibrillation (HCC) 01/25/2023   Hypercoagulable state due to persistent atrial fibrillation (HCC) 01/25/2023   Status post total right knee replacement 11/06/2022   Arthritis of right acromioclavicular joint 08/28/2021   Impingement syndrome of right shoulder 08/19/2021   Nontraumatic incomplete tear of right rotator cuff 08/19/2021   Paresthesia 12/05/2020   Low back pain with right-sided sciatica 12/05/2020   Trochanteric bursitis, right hip 04/24/2020   Essential hypertension 01/12/2020   Sinus bradycardia 01/12/2020   Dyspnea on exertion 01/12/2020   Lumbar stenosis with neurogenic claudication 02/01/2017   Osteoarthritis of left knee 04/27/2014    PCP: Clarice Nottingham MD  REFERRING PROVIDER: Burnetta Brunet, DO  REFERRING DIAG: [phone] (ICD-10-CM) - Chronic left shoulder pain M19.012 (  ICD-10-CM) - Primary osteoarthritis, left shoulder M75.102,M12.812 (ICD-10-CM) - Rotator cuff tear arthropathy of left shoulder  THERAPY DIAG:  Chronic left shoulder pain  Muscle weakness (generalized)  Abnormal posture  Rationale for Evaluation and Treatment: Rehabilitation  ONSET DATE: Oct or Nov 2025.   SUBJECTIVE:                                                                                                                                                                                      SUBJECTIVE STATEMENT: Pt  indicated Lt shoulder hurting insidiously.  Pt indicated about 2 weeks before Thanksgiving.  Pt indicated injection that didn't seem to help.  Recommendation for MRI (scheduled for next week).  Pt indicated having trouble using Lt arm to get dressed, lifting, reaching.  Reported pain with movement and at rest.  Reported difficulty with sleeping due to symptoms.     Rt hand dominant.   PERTINENT HISTORY: Medical history: arthritis, chronic back pain(stenosis), GERD, hyperlipidemia, HTN, OA, history of Lt knee troubles.   PAIN:  NPRS scale: at worst 8-9/10, at current 9/10.   Pain location: Lt shoulder, anterior/lateral shoulder  Pain description: sore, constant pain Aggravating factors: reaching, lifting, getting dressed.  Relieving factors: topical cream helps short term  PRECAUTIONS: None  WEIGHT BEARING RESTRICTIONS: No  FALLS:  Has patient fallen in last 6 months? No  LIVING ENVIRONMENT: Lives in: House/apartment  OCCUPATION: Retired  PLOF: Independent, go to gym.   PATIENT GOALS: Reduce pain,    OBJECTIVE:   DIAGNOSTIC FINDINGS:  05/30/2024 xray:   X-rays of the shoulder show significant degenerative spurring of the  glenohumeral joint with significant joint space narrowing.   PATIENT SURVEYS:  Patient-Specific Activity Scoring Scheme  0 represents "unable to perform." 10 represents "able to perform at prior level. 0 1 2 3 4 5 6 7 8 9  10 (Date and Score)   Activity Eval  06/23/2024    1. sleep  4    2. Getting dressing   2    3. Reaching, lifting household 0   4.    5.    Score 2 avg    Total score = sum of the activity scores/number of activities Minimum detectable change (90%CI) for average score = 2 points Minimum detectable change (90%CI) for single activity score = 3 points  COGNITION: 06/23/2024 Overall cognitive status: WFL     SENSATION: 06/23/2024 WFL  POSTURE: 06/23/2024 Rounded shoulders, FHP.   UPPER EXTREMITY ROM:   ROM  Right Eval 06/23/2024 Left Eval 06/23/2024  Shoulder flexion  < 20 deg with pain against gravity in supine  120 PROM in supine with pain  Shoulder  extension    Shoulder abduction    Shoulder adduction    Shoulder internal rotation  90 deg PROM in supine 45 deg abduction  Shoulder external rotation  10 deg PROM with pain, in supine 45 deg abduction  Elbow flexion    Elbow extension    Wrist flexion    Wrist extension    Wrist ulnar deviation    Wrist radial deviation    Wrist pronation    Wrist supination    (Blank rows = not tested)  UPPER EXTREMITY MMT:  MMT Right Eval 06/23/2024 Left Eval 06/23/2024  Shoulder flexion 5/5 1+/5  Shoulder extension    Shoulder abduction 5/5 1+/5  Shoulder adduction    Shoulder internal rotation 5/5 4/5  Shoulder external rotation 5/5 1+/5  Middle trapezius    Lower trapezius    Elbow flexion 5/5   Elbow extension 5/5   Wrist flexion    Wrist extension    Wrist ulnar deviation    Wrist radial deviation    Wrist pronation    Wrist supination    Grip strength (lbs)    (Blank rows = not tested)  SHOULDER SPECIAL TESTS: 06/23/2024 (+) Painful arc, drop arm, ER lag testing Lt arm  JOINT MOBILITY TESTING:  06/23/2024 No limits noted in inferior and posterior glides Lt shoulder.   PALPATION:  06/23/2024 Tenderness to light touch around Lt shoulder joint.                                                                                                                                                                                                   TODAY'S TREATMENT:                                                                                                       DATE: 06/23/2024 Therex:    HEP instruction/performance c cues for techniques, handout provided.  Trial set performed of each for comprehension and symptom assessment.  See below for exercise list  Manual Lt shoulder inferior glides g2 for pain relief, PROM range with  distraction  Estim IFC to Lt shoulder to tolerance 10 15 mins with education of HEP.  Performed with moist heat on Lt shoulder as well.    PATIENT EDUCATION: 06/23/2024 Education details: HEP, POC Person educated: Patient Education method: Programmer, Multimedia, Demonstration, Verbal cues, and Handouts Education comprehension: verbalized understanding, returned demonstration, and verbal cues required  HOME EXERCISE PROGRAM: Access Code: [email or web address] URL: [email or web address] Date: 06/23/2024 Prepared by: Ozell Silvan  Exercises - Seated Scapular Retraction  - 3-5 x daily - 7 x weekly - 1 sets - 10 reps - 3-5 hold - Seated Isometric Shoulder External Rotation with Towel (Mirrored)  - 1-2 x daily - 7 x weekly - 1 sets - 10 reps - 5 hold - Standing Isometric Shoulder Flexion with Doorway - Arm Bent (Mirrored)  - 1-2 x daily - 7 x weekly - 1 sets - 10 reps - 5 hold  ASSESSMENT:  CLINICAL IMPRESSION: Patient is a 83 y.o. who comes to clinic with complaints of Lt shoulder pain with mobility, strength and movement coordination deficits that impair their ability to perform usual daily and recreational functional activities without increase difficulty/symptoms at this time.  Patient to benefit from skilled PT services to address impairments and limitations to improve to previous level of function without restriction secondary to condition.   Possible rotator cuff integrity issues (MRI next week)  OBJECTIVE IMPAIRMENTS: decreased activity tolerance, decreased balance, decreased coordination, decreased endurance, decreased mobility, decreased ROM, decreased strength, hypomobility, increased fascial restrictions, impaired perceived functional ability, increased muscle spasms, impaired flexibility, impaired UE functional use, improper body mechanics, postural dysfunction, and pain.   ACTIVITY LIMITATIONS: carrying, lifting, sleeping, bed mobility, bathing, toileting, dressing, self feeding,  reach over head, and hygiene/grooming  PARTICIPATION LIMITATIONS: meal prep, cleaning, laundry, interpersonal relationship, driving, shopping, and community activity  PERSONAL FACTORS: Medical history: arthritis, chronic back pain(stenosis), GERD, hyperlipidemia, HTN, OA, history of Lt knee troubles.  are also affecting patient's functional outcome.   REHAB POTENTIAL: Fair to good  CLINICAL DECISION MAKING: Evolving/moderate complexity  EVALUATION COMPLEXITY: Moderate   GOALS: Goals reviewed with patient? Yes  SHORT TERM GOALS: (target date for Short term goals are 3 weeks 07/14/2024)  1.Patient will demonstrate independent use of home exercise program to maintain progress from in clinic treatments. Goal status: New  LONG TERM GOALS: (target dates for all long term goals are 10 weeks  09/01/2024 )   1. Patient will demonstrate/report pain at worst less than or equal to 2/10 to facilitate minimal limitation in daily activity secondary to pain symptoms. Goal status: New   2. Patient will demonstrate independent use of home exercise program to facilitate ability to maintain/progress functional gains from skilled physical therapy services. Goal status: New   3. Patient will demonstrate Patient specific functional scale avg > or = 7/10 to indicate reduced disability due to condition.  Goal status: New   4.  Patient will demonstrate Lt  UE MMT 4/5 or greater throughout to facilitate lifting, reaching, carrying at [facility] in daily activity.   Goal status: New   5.  Patient will demonstrate Lt  [facility] joint AROM WFL s symptoms to facilitate usual overhead reaching, self care, dressing at PLOF.    Goal status: New   6.  Patient will demonstrate/report ability to sleep s restriction due to symptoms.  Goal status: New     PLAN:  PT FREQUENCY: 1-2x/week  PT DURATION: 10 weeks  PLANNED INTERVENTIONS: Can include 02853- PT Re-evaluation, 97110-Therapeutic exercises, 97530- Therapeutic  activity, [id number]- Neuromuscular re-education, 97535- Self Care, [id number]- Manual therapy, [id number]- Gait training, [phone]- Orthotic Fit/training, [phone]- Canalith repositioning,  02886- Aquatic Therapy, [id number]- Electrical stimulation (unattended), [id number] Physical performance testing, 02983- Vasopneumatic device, [id number]- Ultrasound, [id number]- Traction (mechanical), [id number]- Ionotophoresis 4mg /ml Dexamethasone ,  79439 - Needle insertion w/o injection 1 or 2 muscles, 20561 - Needle insertion w/o injection 3 or more muscles.   Patient/Family education, Balance training, Stair training, Taping, Dry Needling, Joint mobilization, Joint manipulation, Spinal manipulation, Spinal mobilization, Scar mobilization, Vestibular training, Visual/preceptual remediation/compensation, DME instructions, Cryotherapy, and Moist heat.  All performed as medically necessary.  All included unless contraindicated  PLAN FOR NEXT SESSION: Review HEP knowledge/results.    Check on MRI information when available.    Ozell Silvan, PT, DPT, OCS, ATC 06/23/24  10:48 AM

## 2024-06-27 ENCOUNTER — Inpatient Hospital Stay: Admission: RE | Admit: 2024-06-27 | Discharge: 2024-06-27 | Attending: Sports Medicine

## 2024-06-27 ENCOUNTER — Ambulatory Visit: Payer: Self-pay | Admitting: Sports Medicine

## 2024-06-27 DIAGNOSIS — M75112 Incomplete rotator cuff tear or rupture of left shoulder, not specified as traumatic: Secondary | ICD-10-CM | POA: Diagnosis not present

## 2024-06-27 DIAGNOSIS — M12812 Other specific arthropathies, not elsewhere classified, left shoulder: Secondary | ICD-10-CM

## 2024-06-27 DIAGNOSIS — G8929 Other chronic pain: Secondary | ICD-10-CM

## 2024-06-27 DIAGNOSIS — M19012 Primary osteoarthritis, left shoulder: Secondary | ICD-10-CM

## 2024-07-04 ENCOUNTER — Ambulatory Visit: Admitting: Physical Therapy

## 2024-07-04 ENCOUNTER — Encounter: Payer: Self-pay | Admitting: Physical Therapy

## 2024-07-04 DIAGNOSIS — G8929 Other chronic pain: Secondary | ICD-10-CM

## 2024-07-04 DIAGNOSIS — M25512 Pain in left shoulder: Secondary | ICD-10-CM | POA: Diagnosis not present

## 2024-07-04 DIAGNOSIS — R293 Abnormal posture: Secondary | ICD-10-CM | POA: Diagnosis not present

## 2024-07-04 DIAGNOSIS — M6281 Muscle weakness (generalized): Secondary | ICD-10-CM | POA: Diagnosis not present

## 2024-07-04 NOTE — Therapy (Signed)
 OUTPATIENT PHYSICAL THERAPY TREATMENT   Patient Name: Melissa Maldonado MRN: [id number] DOB:May 11, 1941, 83 y.o., female Today's Date: 07/04/2024  END OF SESSION:  PT End of Session - 07/04/24 1146     Visit Number 2    Number of Visits 20    Date for Recertification  09/01/24    Authorization Type Medicare    Progress Note Due on Visit 10    PT Start Time 1146    PT Stop Time 1225    PT Time Calculation (min) 39 min    Activity Tolerance Patient limited by pain    Behavior During Therapy [facility] for tasks assessed/performed           Past Medical History:  Diagnosis Date   Arthritis    Chronic back pain    lumbar stenosis   Dysrhythmia    Early cataracts, bilateral    GERD (gastroesophageal reflux disease)    occasionally but no meds required   History of blood transfusion    History of colon polyps    Hyperlipidemia    takes Lipitor every other day   Hypertension    takes losartan  daily   Neuropathy    Osteoarthritis of left knee 04/27/2014   Restless leg    Shortness of breath    with exertion   SOB (shortness of breath) 2013   CPET   Tear of medial meniscus of left knee 09/08/2013   Past Surgical History:  Procedure Laterality Date   APPENDECTOMY     BACK SURGERY  07/20/2008   lumb fusion   CARDIOVERSION N/A 01/29/2023   Procedure: CARDIOVERSION;  Surgeon: Alvan Ronal BRAVO, MD;  Location: [facility];  Service: Cardiovascular;  Laterality: N/A;   COLONOSCOPY  2018   EYE SURGERY     both cataracts   KNEE ARTHROSCOPY WITH MEDIAL MENISECTOMY Left 09/08/2013   Procedure: LEFT KNEE ARTHROSCOPY WITH PARTIAL MEDIAL MENISCECTOMY;  Surgeon: Fonda SHAUNNA Olmsted, MD;  Location: [facility];  Service: Orthopedics;  Laterality: Left;   LUMBAR LAMINECTOMY/DECOMPRESSION MICRODISCECTOMY  05/13/2012   Procedure: LUMBAR LAMINECTOMY/DECOMPRESSION MICRODISCECTOMY 1 LEVEL;  Surgeon: Victory Gens, MD;  Location: [facility];  Service: Neurosurgery;  Laterality:  Bilateral;  Bilateral Lumbar one -two Decompressive Laminectomy   PARTIAL KNEE ARTHROPLASTY Left 04/27/2014   Procedure: LEFT UNICOMPARTMENTAL KNEE;  Surgeon: Fonda SHAUNNA Olmsted, MD;  Location: [facility];  Service: Orthopedics;  Laterality: Left;   TOTAL KNEE ARTHROPLASTY Right 11/06/2022   Procedure: RIGHT TOTAL KNEE ARTHROPLASTY;  Surgeon: Vernetta Lonni GRADE, MD;  Location: [facility];  Service: Orthopedics;  Laterality: Right;   TUBAL LIGATION  1974   UPPER GI ENDOSCOPY     Patient Active Problem List   Diagnosis Date Noted   Persistent atrial fibrillation (HCC) 01/25/2023   Hypercoagulable state due to persistent atrial fibrillation (HCC) 01/25/2023   Status post total right knee replacement 11/06/2022   Arthritis of right acromioclavicular joint 08/28/2021   Impingement syndrome of right shoulder 08/19/2021   Nontraumatic incomplete tear of right rotator cuff 08/19/2021   Paresthesia 12/05/2020   Low back pain with right-sided sciatica 12/05/2020   Trochanteric bursitis, right hip 04/24/2020   Essential hypertension 01/12/2020   Sinus bradycardia 01/12/2020   Dyspnea on exertion 01/12/2020   Lumbar stenosis with neurogenic claudication 02/01/2017   Osteoarthritis of left knee 04/27/2014    PCP: Clarice Nottingham MD  REFERRING PROVIDER: Burnetta Brunet, DO  REFERRING DIAG: [phone] (ICD-10-CM) - Chronic left shoulder pain M19.012 (  ICD-10-CM) - Primary osteoarthritis, left shoulder M75.102,M12.812 (ICD-10-CM) - Rotator cuff tear arthropathy of left shoulder  THERAPY DIAG:  Chronic left shoulder pain  Muscle weakness (generalized)  Abnormal posture  Rationale for Evaluation and Treatment: Rehabilitation  ONSET DATE: Oct or Nov 2025.   SUBJECTIVE:                                                                                                                                                                                      SUBJECTIVE STATEMENT: She has  been doing the exercises but shoulder still hurts.  She had MRI and is going back to doctor next Tuesday.  Rt hand dominant.   PERTINENT HISTORY: Medical history: arthritis, chronic back pain(stenosis), GERD, hyperlipidemia, HTN, OA, history of Lt knee troubles.   PAIN:  NPRS scale: since last PT at worst 810, best 6/10 with 2 tylenol ,  at current 8/10.   Pain location: Lt shoulder, anterior/lateral shoulder  Pain description: sore, constant pain Aggravating factors: reaching, lifting, getting dressed.  Relieving factors: topical cream helps short term  PRECAUTIONS: None  WEIGHT BEARING RESTRICTIONS: No  FALLS:  Has patient fallen in last 6 months? No  LIVING ENVIRONMENT: Lives in: House/apartment  OCCUPATION: Retired  PLOF: Independent, go to gym.   PATIENT GOALS: Reduce pain,    OBJECTIVE:   DIAGNOSTIC FINDINGS:  05/30/2024 xray:   X-rays of the shoulder show significant degenerative spurring of the  glenohumeral joint with significant joint space narrowing.   PATIENT SURVEYS:  Patient-Specific Activity Scoring Scheme  0 represents unable to perform. 10 represents able to perform at prior level. 0 1 2 3 4 5 6 7 8 9  10 (Date and Score)   Activity Eval  06/23/2024    1. sleep  4    2. Getting dressing   2    3. Reaching, lifting household 0   4.    5.    Score 2 avg    Total score = sum of the activity scores/number of activities Minimum detectable change (90%CI) for average score = 2 points Minimum detectable change (90%CI) for single activity score = 3 points  COGNITION: 06/23/2024 Overall cognitive status: WFL     SENSATION: 06/23/2024 WFL  POSTURE: 06/23/2024 Rounded shoulders, FHP.   UPPER EXTREMITY ROM:   ROM Left Eval 06/23/2024  Shoulder flexion < 20 deg with pain against gravity in supine  120 PROM in supine with pain  Shoulder extension   Shoulder abduction   Shoulder adduction   Shoulder internal rotation 90 deg PROM in  supine 45 deg abduction  Shoulder external rotation 10 deg PROM with pain, in supine 45 deg abduction  Elbow flexion   Elbow extension   Wrist flexion   Wrist extension   Wrist ulnar deviation   Wrist radial deviation   Wrist pronation   Wrist supination   (Blank rows = not tested)  UPPER EXTREMITY MMT:  MMT Right Eval 06/23/2024 Left Eval 06/23/2024  Shoulder flexion 5/5 1+/5  Shoulder extension    Shoulder abduction 5/5 1+/5  Shoulder adduction    Shoulder internal rotation 5/5 4/5  Shoulder external rotation 5/5 1+/5  Middle trapezius    Lower trapezius    Elbow flexion 5/5   Elbow extension 5/5   Wrist flexion    Wrist extension    Wrist ulnar deviation    Wrist radial deviation    Wrist pronation    Wrist supination    Grip strength (lbs)    (Blank rows = not tested)  SHOULDER SPECIAL TESTS: 06/23/2024 (+) Painful arc, drop arm, ER lag testing Lt arm  JOINT MOBILITY TESTING:  06/23/2024 No limits noted in inferior and posterior glides Lt shoulder.   PALPATION:  06/23/2024 Tenderness to light touch around Lt shoulder joint.                                                                                                                                                                                                   TODAY'S TREATMENT:                                                                                                       DATE: 07/04/2024 Therapeutic Exercise: Cervical retraction seated 10 reps with tactile cues. Scapular retraction 10 reps Pendulum exercises flexion/extension, abduction/adduction and circles CW/CCW.  PT tactile cues for range.  Patient did report decrease in pain following pendulum exercises. Active internal and external rotation with pronation /  supination supine with elbow extended 15 reps Supine bicep curls with bicep stretch.   Self-care: PT demo and verbal cues on positioning in supine and side-lying.  Patient verbalized  understanding. PT demo verbal cues on positioning and sitting to support upper extremity with proper posture.  Patient verbalized understanding.  Manual Lt shoulder inferior glides g2 for pain relief, PROM range  with distraction      TREATMENT:                                                                                                       DATE: 06/23/2024 Therex:    HEP instruction/performance c cues for techniques, handout provided.  Trial set performed of each for comprehension and symptom assessment.  See below for exercise list  Manual Lt shoulder inferior glides g2 for pain relief, PROM range with distraction  Estim IFC to Lt shoulder to tolerance 10 15 mins with education of HEP.  Performed with moist heat on Lt shoulder as well.    PATIENT EDUCATION: 06/23/2024 Education details: HEP, POC Person educated: Patient Education method: Programmer, Multimedia, Demonstration, Verbal cues, and Handouts Education comprehension: verbalized understanding, returned demonstration, and verbal cues required  HOME EXERCISE PROGRAM: Access Code: [email or web address] URL: [email or web address] Date: 06/23/2024 Prepared by: Ozell Silvan  Exercises - Seated Scapular Retraction  - 3-5 x daily - 7 x weekly - 1 sets - 10 reps - 3-5 hold - Seated Isometric Shoulder External Rotation with Towel (Mirrored)  - 1-2 x daily - 7 x weekly - 1 sets - 10 reps - 5 hold - Standing Isometric Shoulder Flexion with Doorway - Arm Bent (Mirrored)  - 1-2 x daily - 7 x weekly - 1 sets - 10 reps - 5 hold  ASSESSMENT:  CLINICAL IMPRESSION: Patient reported less pain at the end of the session and then at the beginning of the session.  Patient able to improve posture with PT cues.  She appears to understand PT recommendations for positioning to improve sleep.  Possible rotator cuff integrity issues (MRI next week)  OBJECTIVE IMPAIRMENTS: decreased activity tolerance, decreased balance, decreased coordination,  decreased endurance, decreased mobility, decreased ROM, decreased strength, hypomobility, increased fascial restrictions, impaired perceived functional ability, increased muscle spasms, impaired flexibility, impaired UE functional use, improper body mechanics, postural dysfunction, and pain.   ACTIVITY LIMITATIONS: carrying, lifting, sleeping, bed mobility, bathing, toileting, dressing, self feeding, reach over head, and hygiene/grooming  PARTICIPATION LIMITATIONS: meal prep, cleaning, laundry, interpersonal relationship, driving, shopping, and community activity  PERSONAL FACTORS: Medical history: arthritis, chronic back pain(stenosis), GERD, hyperlipidemia, HTN, OA, history of Lt knee troubles.  are also affecting patient's functional outcome.   REHAB POTENTIAL: Fair to good  CLINICAL DECISION MAKING: Evolving/moderate complexity  EVALUATION COMPLEXITY: Moderate   GOALS: Goals reviewed with patient? Yes  SHORT TERM GOALS: (target date for Short term goals are 3 weeks 07/14/2024)  1.Patient will demonstrate independent use of home exercise program to maintain progress from in clinic treatments. Goal status: Ongoing  07/04/2024  LONG TERM GOALS: (target dates for all long term goals are 10 weeks  09/01/2024 )   1. Patient will demonstrate/report pain at worst less than or equal to 2/10 to facilitate minimal limitation in daily activity secondary to pain symptoms. Goal status: Ongoing  07/04/2024   2. Patient will demonstrate independent use of home exercise program to facilitate ability to maintain/progress functional gains from skilled physical therapy services. Goal status: Ongoing  07/04/2024  3. Patient will demonstrate Patient specific functional scale avg > or = 7/10 to indicate reduced disability due to condition.  Goal status: Ongoing  07/04/2024   4.  Patient will demonstrate Lt  UE MMT 4/5 or greater throughout to facilitate lifting, reaching, carrying at [facility] in daily  activity.   Goal status: Ongoing  07/04/2024   5.  Patient will demonstrate Lt  [facility] joint AROM WFL s symptoms to facilitate usual overhead reaching, self care, dressing at PLOF.    Goal status: Ongoing  07/04/2024   6.  Patient will demonstrate/report ability to sleep s restriction due to symptoms.  Goal status: Ongoing  07/04/2024     PLAN:  PT FREQUENCY: 1-2x/week  PT DURATION: 10 weeks  PLANNED INTERVENTIONS: Can include 02853- PT Re-evaluation, 97110-Therapeutic exercises, 97530- Therapeutic activity, 97112- Neuromuscular re-education, 97535- Self Care, 97140- Manual therapy, [phone]- Gait training, [phone]- Orthotic Fit/training, [phone]- Canalith repositioning, [id number]- Aquatic Therapy, [phone]- Electrical stimulation (unattended), [id number] Physical performance testing, 97016- Vasopneumatic device, [id number]- Ultrasound, [id number]- Traction (mechanical), [id number]- Ionotophoresis 4mg /ml Dexamethasone ,  [id number] - Needle insertion w/o injection 1 or 2 muscles, 20561 - Needle insertion w/o injection 3 or more muscles.   Patient/Family education, Balance training, Stair training, Taping, Dry Needling, Joint mobilization, Joint manipulation, Spinal manipulation, Spinal mobilization, Scar mobilization, Vestibular training, Visual/preceptual remediation/compensation, DME instructions, Cryotherapy, and Moist heat.  All performed as medically necessary.  All included unless contraindicated  PLAN FOR NEXT SESSION: Add pendulum exercises to HEP.  Continue with therapeutic activities and therapeutic exercise for improved shoulder range and function with decreased pain.   Check on MRI information when available.    Andalyn Heckstall, PT, DPT 07/04/2024, 3:55 PM

## 2024-07-06 ENCOUNTER — Ambulatory Visit: Admitting: Physical Therapy

## 2024-07-06 ENCOUNTER — Encounter: Payer: Self-pay | Admitting: Physical Therapy

## 2024-07-06 DIAGNOSIS — M6281 Muscle weakness (generalized): Secondary | ICD-10-CM | POA: Diagnosis not present

## 2024-07-06 DIAGNOSIS — M25512 Pain in left shoulder: Secondary | ICD-10-CM | POA: Diagnosis not present

## 2024-07-06 DIAGNOSIS — R293 Abnormal posture: Secondary | ICD-10-CM | POA: Diagnosis not present

## 2024-07-06 DIAGNOSIS — G8929 Other chronic pain: Secondary | ICD-10-CM | POA: Diagnosis not present

## 2024-07-06 NOTE — Therapy (Signed)
 OUTPATIENT PHYSICAL THERAPY TREATMENT   Patient Name: Melissa Maldonado MRN: [id number] DOB:17-Feb-1941, 83 y.o., female Today's Date: 07/06/2024  END OF SESSION:  PT End of Session - 07/06/24 0842     Visit Number 3    Number of Visits 20    Date for Recertification  09/01/24    Authorization Type Medicare    Progress Note Due on Visit 10    PT Start Time [phone]    PT Stop Time 0930    PT Time Calculation (min) 48 min    Activity Tolerance Patient limited by pain    Behavior During Therapy [facility] for tasks assessed/performed            Past Medical History:  Diagnosis Date   Arthritis    Chronic back pain    lumbar stenosis   Dysrhythmia    Early cataracts, bilateral    GERD (gastroesophageal reflux disease)    occasionally but no meds required   History of blood transfusion    History of colon polyps    Hyperlipidemia    takes Lipitor every other day   Hypertension    takes losartan  daily   Neuropathy    Osteoarthritis of left knee 04/27/2014   Restless leg    Shortness of breath    with exertion   SOB (shortness of breath) 2013   CPET   Tear of medial meniscus of left knee 09/08/2013   Past Surgical History:  Procedure Laterality Date   APPENDECTOMY     BACK SURGERY  07/20/2008   lumb fusion   CARDIOVERSION N/A 01/29/2023   Procedure: CARDIOVERSION;  Surgeon: Alvan Ronal BRAVO, MD;  Location: [facility];  Service: Cardiovascular;  Laterality: N/A;   COLONOSCOPY  2018   EYE SURGERY     both cataracts   KNEE ARTHROSCOPY WITH MEDIAL MENISECTOMY Left 09/08/2013   Procedure: LEFT KNEE ARTHROSCOPY WITH PARTIAL MEDIAL MENISCECTOMY;  Surgeon: Fonda SHAUNNA Olmsted, MD;  Location: [facility];  Service: Orthopedics;  Laterality: Left;   LUMBAR LAMINECTOMY/DECOMPRESSION MICRODISCECTOMY  05/13/2012   Procedure: LUMBAR LAMINECTOMY/DECOMPRESSION MICRODISCECTOMY 1 LEVEL;  Surgeon: Victory Gens, MD;  Location: [facility];  Service: Neurosurgery;  Laterality:  Bilateral;  Bilateral Lumbar one -two Decompressive Laminectomy   PARTIAL KNEE ARTHROPLASTY Left 04/27/2014   Procedure: LEFT UNICOMPARTMENTAL KNEE;  Surgeon: Fonda SHAUNNA Olmsted, MD;  Location: [facility];  Service: Orthopedics;  Laterality: Left;   TOTAL KNEE ARTHROPLASTY Right 11/06/2022   Procedure: RIGHT TOTAL KNEE ARTHROPLASTY;  Surgeon: Vernetta Lonni GRADE, MD;  Location: [facility];  Service: Orthopedics;  Laterality: Right;   TUBAL LIGATION  1974   UPPER GI ENDOSCOPY     Patient Active Problem List   Diagnosis Date Noted   Persistent atrial fibrillation (HCC) 01/25/2023   Hypercoagulable state due to persistent atrial fibrillation (HCC) 01/25/2023   Status post total right knee replacement 11/06/2022   Arthritis of right acromioclavicular joint 08/28/2021   Impingement syndrome of right shoulder 08/19/2021   Nontraumatic incomplete tear of right rotator cuff 08/19/2021   Paresthesia 12/05/2020   Low back pain with right-sided sciatica 12/05/2020   Trochanteric bursitis, right hip 04/24/2020   Essential hypertension 01/12/2020   Sinus bradycardia 01/12/2020   Dyspnea on exertion 01/12/2020   Lumbar stenosis with neurogenic claudication 02/01/2017   Osteoarthritis of left knee 04/27/2014    PCP: Clarice Nottingham MD  REFERRING PROVIDER: Burnetta Brunet, DO  REFERRING DIAG: [phone] (ICD-10-CM) - Chronic left shoulder pain  M19.012 (ICD-10-CM) - Primary osteoarthritis, left shoulder M75.102,M12.812 (ICD-10-CM) - Rotator cuff tear arthropathy of left shoulder  THERAPY DIAG:  Chronic left shoulder pain  Muscle weakness (generalized)  Abnormal posture  Rationale for Evaluation and Treatment: Rehabilitation  ONSET DATE: Oct or Nov 2025.   SUBJECTIVE:                                                                                                                                                                                      SUBJECTIVE STATEMENT: Day time  is improving but still problematic with pain.  Still can not sleep due to shoulder pain even on side with pillows.   Rt hand dominant.   PERTINENT HISTORY: Medical history: arthritis, chronic back pain(stenosis), GERD, hyperlipidemia, HTN, OA, history of Lt knee troubles.   PAIN:  NPRS scale: since last PT at worst 8/10, best 6/10 with 2 tylenol ,  at current 8/10.   Pain location: Lt shoulder, anterior/lateral shoulder  Pain description: sore, constant pain Aggravating factors: reaching, lifting, getting dressed.  Relieving factors: topical cream helps short term  PRECAUTIONS: None  WEIGHT BEARING RESTRICTIONS: No  FALLS:  Has patient fallen in last 6 months? No  LIVING ENVIRONMENT: Lives in: House/apartment  OCCUPATION: Retired  PLOF: Independent, go to gym.   PATIENT GOALS: Reduce pain,    OBJECTIVE:   DIAGNOSTIC FINDINGS:  05/30/2024 xray:   X-rays of the shoulder show significant degenerative spurring of the  glenohumeral joint with significant joint space narrowing.   PATIENT SURVEYS:  Patient-Specific Activity Scoring Scheme  0 represents unable to perform. 10 represents able to perform at prior level. 0 1 2 3 4 5 6 7 8 9  10 (Date and Score)   Activity Eval  06/23/2024    1. sleep  4    2. Getting dressing   2    3. Reaching, lifting household 0   4.    5.    Score 2 avg    Total score = sum of the activity scores/number of activities Minimum detectable change (90%CI) for average score = 2 points Minimum detectable change (90%CI) for single activity score = 3 points  COGNITION: 06/23/2024 Overall cognitive status: WFL     SENSATION: 06/23/2024 WFL  POSTURE: 06/23/2024 Rounded shoulders, FHP.   UPPER EXTREMITY ROM:   ROM Left Eval 06/23/2024 Left 07/06/24  Shoulder flexion < 20 deg with pain against gravity in supine  120 PROM in supine with pain Supine P: 144* A: 30* limited by pain & weakness  Shoulder extension    Shoulder  abduction    Shoulder adduction    Shoulder internal rotation 90 deg PROM in  supine 45 deg abduction Supine P: 55*  Shoulder external rotation 10 deg PROM with pain, in supine 45 deg abduction Supine P: 66*  Elbow flexion    Elbow extension    Wrist flexion    Wrist extension    Wrist ulnar deviation    Wrist radial deviation    Wrist pronation    Wrist supination    (Blank rows = not tested)  UPPER EXTREMITY MMT:  MMT Right Eval 06/23/2024 Left Eval 06/23/2024  Shoulder flexion 5/5 1+/5  Shoulder extension    Shoulder abduction 5/5 1+/5  Shoulder adduction    Shoulder internal rotation 5/5 4/5  Shoulder external rotation 5/5 1+/5  Middle trapezius    Lower trapezius    Elbow flexion 5/5   Elbow extension 5/5   Wrist flexion    Wrist extension    Wrist ulnar deviation    Wrist radial deviation    Wrist pronation    Wrist supination    Grip strength (lbs)    (Blank rows = not tested)  SHOULDER SPECIAL TESTS: 06/23/2024 (+) Painful arc, drop arm, ER lag testing Lt arm  JOINT MOBILITY TESTING:  06/23/2024 No limits noted in inferior and posterior glides Lt shoulder.   PALPATION:  06/23/2024 Tenderness to light touch around Lt shoulder joint.                                                                                                                                                                                                   TODAY'S TREATMENT:                                                                                                       DATE: 07/06/2024 Therapeutic Exercise: Following Nustep pt performed Pendulum exercises flexion/extension, abduction/adduction and circles CW/CCW.  PT tactile cues for range.  Patient did report decrease in pain following pendulum exercises. Active internal and external rotation with pronation /  supination supine with elbow extended 15 reps Supine bicep curls with bicep stretch.   Therapeutic Activities: Posture  set prior to activity Cervical retraction seated 10 reps with tactile cues. Posture set prior to activity Scapular retraction  10 reps with tactile cues to limit GH ext. Nustep with BUEs & BLEs seat 7 level 4 for 8 min with focus on full motion to facilitate arm motion. Reports left shoulder pain 8/10 at beginning and 7/10 at end.    Manual Lt shoulder inferior glides g2 for pain relief, PROM range with distraction  Estim IFC to Lt shoulder with 4 electrodes crossing GH to tolerance at 18 for 10 mins. Performed with moist heat on Lt shoulder as well.    TREATMENT:                                                                                                       DATE: 07/04/2024 Therapeutic Exercise: Cervical retraction seated 10 reps with tactile cues. Scapular retraction 10 reps Pendulum exercises flexion/extension, abduction/adduction and circles CW/CCW.  PT tactile cues for range.  Patient did report decrease in pain following pendulum exercises. Active internal and external rotation with pronation /  supination supine with elbow extended 15 reps Supine bicep curls with bicep stretch.   Self-care: PT demo and verbal cues on positioning in supine and side-lying.  Patient verbalized understanding. PT demo verbal cues on positioning and sitting to support upper extremity with proper posture.  Patient verbalized understanding.  Manual Lt shoulder inferior glides g2 for pain relief, PROM range with distraction      TREATMENT:                                                                                                       DATE: 06/23/2024 Therex:    HEP instruction/performance c cues for techniques, handout provided.  Trial set performed of each for comprehension and symptom assessment.  See below for exercise list  Manual Lt shoulder inferior glides g2 for pain relief, PROM range with distraction  Estim IFC to Lt shoulder to tolerance 10 15 mins with education of HEP.   Performed with moist heat on Lt shoulder as well.    PATIENT EDUCATION: 06/23/2024 Education details: HEP, POC Person educated: Patient Education method: Programmer, Multimedia, Demonstration, Verbal cues, and Handouts Education comprehension: verbalized understanding, returned demonstration, and verbal cues required  HOME EXERCISE PROGRAM: Access Code: [email or web address] URL: [email or web address] Date: 07/06/2024 Prepared by: Grayce Spatz  Exercises - Seated Scapular Retraction  - 3-5 x daily - 7 x weekly - 1 sets - 10 reps - 3-5 hold - Seated Isometric Shoulder External Rotation with Towel (Mirrored)  - 1-2 x daily - 7 x weekly - 1 sets - 10 reps - 5 hold - Standing Isometric Shoulder Flexion with Doorway - Arm Bent (Mirrored)  - 1-2 x daily - 7 x weekly - 1  sets - 10 reps - 5 hold - Circular Shoulder Pendulum with Table Support  - 2 x daily - 7 x weekly - 1 sets - 10 reps - Flexion-Extension Shoulder Pendulum with Table Support  - 2 x daily - 7 x weekly - 1 sets - 10 reps - Horizontal Shoulder Pendulum with Table Support  - 2 x daily - 7 x weekly - 1 sets - 10 reps  ASSESSMENT:  CLINICAL IMPRESSION: Patient reports pain decreased from 8/10 entering PT to 4/10 exiting PT.  She continues to be limited by pain and weakness.    Possible rotator cuff integrity issues sees Dr. Burnetta on 12/23 for MRI results.   OBJECTIVE IMPAIRMENTS: decreased activity tolerance, decreased balance, decreased coordination, decreased endurance, decreased mobility, decreased ROM, decreased strength, hypomobility, increased fascial restrictions, impaired perceived functional ability, increased muscle spasms, impaired flexibility, impaired UE functional use, improper body mechanics, postural dysfunction, and pain.   ACTIVITY LIMITATIONS: carrying, lifting, sleeping, bed mobility, bathing, toileting, dressing, self feeding, reach over head, and hygiene/grooming  PARTICIPATION LIMITATIONS: meal prep, cleaning,  laundry, interpersonal relationship, driving, shopping, and community activity  PERSONAL FACTORS: Medical history: arthritis, chronic back pain(stenosis), GERD, hyperlipidemia, HTN, OA, history of Lt knee troubles.  are also affecting patient's functional outcome.   REHAB POTENTIAL: Fair to good  CLINICAL DECISION MAKING: Evolving/moderate complexity  EVALUATION COMPLEXITY: Moderate   GOALS: Goals reviewed with patient? Yes  SHORT TERM GOALS: (target date for Short term goals are 3 weeks 07/14/2024)  1.Patient will demonstrate independent use of home exercise program to maintain progress from in clinic treatments. Goal status: Ongoing  07/04/2024  LONG TERM GOALS: (target dates for all long term goals are 10 weeks  09/01/2024 )   1. Patient will demonstrate/report pain at worst less than or equal to 2/10 to facilitate minimal limitation in daily activity secondary to pain symptoms. Goal status: Ongoing  07/04/2024   2. Patient will demonstrate independent use of home exercise program to facilitate ability to maintain/progress functional gains from skilled physical therapy services. Goal status: Ongoing  07/04/2024   3. Patient will demonstrate Patient specific functional scale avg > or = 7/10 to indicate reduced disability due to condition.  Goal status: Ongoing  07/04/2024   4.  Patient will demonstrate Lt  UE MMT 4/5 or greater throughout to facilitate lifting, reaching, carrying at [facility] in daily activity.   Goal status: Ongoing  07/04/2024   5.  Patient will demonstrate Lt  [facility] joint AROM WFL s symptoms to facilitate usual overhead reaching, self care, dressing at PLOF.    Goal status: Ongoing  07/04/2024   6.  Patient will demonstrate/report ability to sleep s restriction due to symptoms.  Goal status: Ongoing  07/04/2024     PLAN:  PT FREQUENCY: 1-2x/week  PT DURATION: 10 weeks  PLANNED INTERVENTIONS: Can include 02853- PT Re-evaluation, 97110-Therapeutic  exercises, 97530- Therapeutic activity, 97112- Neuromuscular re-education, 97535- Self Care, 97140- Manual therapy, [phone]- Gait training, [phone]- Orthotic Fit/training, [id number]- Canalith repositioning, [id number]- Aquatic Therapy, [phone]- Electrical stimulation (unattended), [id number] Physical performance testing, 97016- Vasopneumatic device, [id number]- Ultrasound, [id number]- Traction (mechanical), [id number]- Ionotophoresis 4mg /ml Dexamethasone ,  79439 - Needle insertion w/o injection 1 or 2 muscles, 20561 - Needle insertion w/o injection 3 or more muscles.   Patient/Family education, Balance training, Stair training, Taping, Dry Needling, Joint mobilization, Joint manipulation, Spinal manipulation, Spinal mobilization, Scar mobilization, Vestibular training, Visual/preceptual remediation/compensation, DME instructions, Cryotherapy, and Moist heat.  All performed as medically necessary.  All included  unless contraindicated  PLAN FOR NEXT SESSION: send note to Dr. Burnetta prior to office visit.   Continue with therapeutic activities and therapeutic exercise for improved shoulder range and function with decreased pain.   Check on MRI information when available.    Grayce Spatz, PT, DPT 07/06/2024, 12:46 PM

## 2024-07-10 ENCOUNTER — Ambulatory Visit (INDEPENDENT_AMBULATORY_CARE_PROVIDER_SITE_OTHER): Admitting: Physical Therapy

## 2024-07-10 ENCOUNTER — Encounter: Payer: Self-pay | Admitting: Physical Therapy

## 2024-07-10 DIAGNOSIS — G8929 Other chronic pain: Secondary | ICD-10-CM | POA: Diagnosis not present

## 2024-07-10 DIAGNOSIS — M6281 Muscle weakness (generalized): Secondary | ICD-10-CM

## 2024-07-10 DIAGNOSIS — R293 Abnormal posture: Secondary | ICD-10-CM

## 2024-07-10 DIAGNOSIS — M25512 Pain in left shoulder: Secondary | ICD-10-CM

## 2024-07-10 NOTE — Therapy (Signed)
 " OUTPATIENT PHYSICAL THERAPY TREATMENT Progress note   Patient Name: Melissa Maldonado MRN: [id number] DOB:Jan 02, 1941, 83 y.o., female Today's Date: 07/10/2024  Progress Note Reporting Period 06/23/24 to 07/10/24  See note below for Objective Data and Assessment of Progress/Goals.      END OF SESSION:  PT End of Session - 07/10/24 0936     Visit Number 4    Number of Visits 20    Date for Recertification  09/01/24    Authorization Type Medicare    Authorization Time Period PN sent at visit 4    Progress Note Due on Visit 14    PT Start Time 0933    PT Stop Time 1015    PT Time Calculation (min) 42 min    Activity Tolerance Patient limited by pain    Behavior During Therapy [facility] for tasks assessed/performed             Past Medical History:  Diagnosis Date   Arthritis    Chronic back pain    lumbar stenosis   Dysrhythmia    Early cataracts, bilateral    GERD (gastroesophageal reflux disease)    occasionally but no meds required   History of blood transfusion    History of colon polyps    Hyperlipidemia    takes Lipitor every other day   Hypertension    takes losartan  daily   Neuropathy    Osteoarthritis of left knee 04/27/2014   Restless leg    Shortness of breath    with exertion   SOB (shortness of breath) 2013   CPET   Tear of medial meniscus of left knee 09/08/2013   Past Surgical History:  Procedure Laterality Date   APPENDECTOMY     BACK SURGERY  07/20/2008   lumb fusion   CARDIOVERSION N/A 01/29/2023   Procedure: CARDIOVERSION;  Surgeon: Alvan Ronal BRAVO, MD;  Location: [facility];  Service: Cardiovascular;  Laterality: N/A;   COLONOSCOPY  2018   EYE SURGERY     both cataracts   KNEE ARTHROSCOPY WITH MEDIAL MENISECTOMY Left 09/08/2013   Procedure: LEFT KNEE ARTHROSCOPY WITH PARTIAL MEDIAL MENISCECTOMY;  Surgeon: Fonda SHAUNNA Olmsted, MD;  Location: [facility];  Service: Orthopedics;  Laterality: Left;   LUMBAR  LAMINECTOMY/DECOMPRESSION MICRODISCECTOMY  05/13/2012   Procedure: LUMBAR LAMINECTOMY/DECOMPRESSION MICRODISCECTOMY 1 LEVEL;  Surgeon: Victory Gens, MD;  Location: [facility];  Service: Neurosurgery;  Laterality: Bilateral;  Bilateral Lumbar one -two Decompressive Laminectomy   PARTIAL KNEE ARTHROPLASTY Left 04/27/2014   Procedure: LEFT UNICOMPARTMENTAL KNEE;  Surgeon: Fonda SHAUNNA Olmsted, MD;  Location: [facility];  Service: Orthopedics;  Laterality: Left;   TOTAL KNEE ARTHROPLASTY Right 11/06/2022   Procedure: RIGHT TOTAL KNEE ARTHROPLASTY;  Surgeon: Vernetta Lonni GRADE, MD;  Location: [facility];  Service: Orthopedics;  Laterality: Right;   TUBAL LIGATION  1974   UPPER GI ENDOSCOPY     Patient Active Problem List   Diagnosis Date Noted   Persistent atrial fibrillation (HCC) 01/25/2023   Hypercoagulable state due to persistent atrial fibrillation (HCC) 01/25/2023   Status post total right knee replacement 11/06/2022   Arthritis of right acromioclavicular joint 08/28/2021   Impingement syndrome of right shoulder 08/19/2021   Nontraumatic incomplete tear of right rotator cuff 08/19/2021   Paresthesia 12/05/2020   Low back pain with right-sided sciatica 12/05/2020   Trochanteric bursitis, right hip 04/24/2020   Essential hypertension 01/12/2020   Sinus bradycardia 01/12/2020   Dyspnea on exertion 01/12/2020  Lumbar stenosis with neurogenic claudication 02/01/2017   Osteoarthritis of left knee 04/27/2014    PCP: Clarice Nottingham MD  REFERRING PROVIDER: Burnetta Brunet, DO  REFERRING DIAG: [phone] (ICD-10-CM) - Chronic left shoulder pain M19.012 (ICD-10-CM) - Primary osteoarthritis, left shoulder M75.102,M12.812 (ICD-10-CM) - Rotator cuff tear arthropathy of left shoulder  THERAPY DIAG:  Chronic left shoulder pain  Muscle weakness (generalized)  Abnormal posture  Rationale for Evaluation and Treatment: Rehabilitation  ONSET DATE: Oct or Nov 2025.   SUBJECTIVE:                                                                                                                                                                                       SUBJECTIVE STATEMENT: Still reporting high pain levels  Rt hand dominant.   PERTINENT HISTORY: Medical history: arthritis, chronic back pain(stenosis), GERD, hyperlipidemia, HTN, OA, history of Lt knee troubles.   PAIN:  NPRS scale: 07/10/24: at worst 9/10, at current 8/10.   Pain location: Lt shoulder, anterior/lateral shoulder  Pain description: sore, constant pain Aggravating factors: reaching, lifting, getting dressed.  Relieving factors: topical cream helps short term  PRECAUTIONS: None  WEIGHT BEARING RESTRICTIONS: No  FALLS:  Has patient fallen in last 6 months? No  LIVING ENVIRONMENT: Lives in: House/apartment  OCCUPATION: Retired  PLOF: Independent, go to gym.   PATIENT GOALS: Reduce pain,    OBJECTIVE:   DIAGNOSTIC FINDINGS:  05/30/2024 xray:   X-rays of the shoulder show significant degenerative spurring of the  glenohumeral joint with significant joint space narrowing.   06/27/24:  IMPRESSION: 1. Severe tendinosis of the supraspinatus tendon with a shallow partial-thickness bursal surface tear towards the anterior insertion. 2. Severe infraspinatus tendinosis with a interstitial tear measuring 16 mm medial-lateral and 7 mm AP towards the anterior aspect. 3. Severe subscapularis tendinosis with a small partial-thickness articular surface tear. 4. Moderate tendinosis of the intra-articular portion of the long head of the biceps tendon. 5. Severe subacromial/subdeltoid bursitis. 6. Moderate-severe osteoarthritis of the glenohumeral joint.  PATIENT SURVEYS:  Patient-Specific Activity Scoring Scheme  0 represents unable to perform. 10 represents able to perform at prior level. 0 1 2 3 4 5 6 7 8 9  10 (Date and Score)   Activity Eval  06/23/2024 07/10/24   1.  sleep  4 6   2. Getting dressing   2  7  3. Reaching, lifting household 0 0  Score 2 avg 4.33   Total score = sum of the activity scores/number of activities Minimum detectable change (90%CI) for average score = 2 points Minimum detectable change (90%CI) for single activity score = 3 points  COGNITION: 06/23/2024 Overall cognitive  status: WFL     SENSATION: 06/23/2024 WFL  POSTURE: 06/23/2024 Rounded shoulders, FHP.   UPPER EXTREMITY ROM:   ROM Left Eval 06/23/2024 Left 07/06/24 07/10/24 Left  Shoulder flexion < 20 deg with pain against gravity in supine  120 PROM in supine with pain Supine P: 144* A: 30* limited by pain & weakness Supine P: 108  Shoulder extension     Shoulder abduction     Shoulder adduction     Shoulder internal rotation 90 deg PROM in supine 45 deg abduction Supine P: 55*   Shoulder external rotation 10 deg PROM with pain, in supine 45 deg abduction Supine P: 66* Supine A: 50 P: 55  Elbow flexion     Elbow extension     Wrist flexion     Wrist extension     Wrist ulnar deviation     Wrist radial deviation     Wrist pronation     Wrist supination     (Blank rows = not tested)  UPPER EXTREMITY MMT:  MMT Right Eval 06/23/2024 Left Eval 06/23/2024  Shoulder flexion 5/5 1+/5  Shoulder extension    Shoulder abduction 5/5 1+/5  Shoulder adduction    Shoulder internal rotation 5/5 4/5  Shoulder external rotation 5/5 1+/5  Middle trapezius    Lower trapezius    Elbow flexion 5/5   Elbow extension 5/5   Wrist flexion    Wrist extension    Wrist ulnar deviation    Wrist radial deviation    Wrist pronation    Wrist supination    Grip strength (lbs)    (Blank rows = not tested)  SHOULDER SPECIAL TESTS: 06/23/2024 (+) Painful arc, drop arm, ER lag testing Lt arm  JOINT MOBILITY TESTING:  06/23/2024 No limits noted in inferior and posterior glides Lt shoulder.   PALPATION:  06/23/2024 Tenderness to light touch around Lt shoulder  joint.                                                                                                                                                                                                  TODAY'S TREATMENT:  DATE: 07/10/2024 Therapeutic Exercise: Standing pendulums x 1 min each: A/P, lateral, Circles CCW/CW Lt bicep curls 2x10; 1# Seated Upper trap stretch x 4 holding 10 sec Therapeutic Activities: Seated UE ranger forward reaching 2 x 10 Seated UE ranger scaption reaching 2 x 10  Seated UE ranger circles both directions x 20  Seated scapular retraction x 10 holding 5 sec Sit to supine and supine to sit, c mild pain in left shoulder noted, pt able to perform independent with increased time to control pain in left shoulder Manual Lt shoulder A/P, inferior glides g2 for pain relief, PROM range with distraction Estim IFC to Lt shoulder with 4 electrodes crossing GH intensity to pt's tolreance x 15 mins. Performed with moist heat on Lt shoulder as well.    TODAY'S TREATMENT:                                                                                                       DATE: 07/06/2024 Therapeutic Exercise: Following Nustep pt performed Pendulum exercises flexion/extension, abduction/adduction and circles CW/CCW.  PT tactile cues for range.  Patient did report decrease in pain following pendulum exercises. Active internal and external rotation with pronation /  supination supine with elbow extended 15 reps Supine bicep curls with bicep stretch.   Therapeutic Activities: Posture set prior to activity Cervical retraction seated 10 reps with tactile cues. Posture set prior to activity Scapular retraction 10 reps with tactile cues to limit GH ext. Nustep with BUEs & BLEs seat 7 level 4 for 8 min with focus on full motion to facilitate arm motion. Reports left shoulder pain  8/10 at beginning and 7/10 at end.    Manual Lt shoulder inferior glides g2 for pain relief, PROM range with distraction  Estim IFC to Lt shoulder with 4 electrodes crossing GH to tolerance at 18 for 10 mins. Performed with moist heat on Lt shoulder as well.    TREATMENT:                                                                                                       DATE: 07/04/2024 Therapeutic Exercise: Cervical retraction seated 10 reps with tactile cues. Scapular retraction 10 reps Pendulum exercises flexion/extension, abduction/adduction and circles CW/CCW.  PT tactile cues for range.  Patient did report decrease in pain following pendulum exercises. Active internal and external rotation with pronation /  supination supine with elbow extended 15 reps Supine bicep curls with bicep stretch.   Self-care: PT demo and verbal cues on positioning in supine and side-lying.  Patient verbalized understanding. PT demo verbal cues on positioning and sitting to support upper extremity with proper  posture.  Patient verbalized understanding.  Manual Lt shoulder inferior glides g2 for pain relief, PROM range with distraction      TREATMENT:                                                                                                       DATE: 06/23/2024 Therex:    HEP instruction/performance c cues for techniques, handout provided.  Trial set performed of each for comprehension and symptom assessment.  See below for exercise list  Manual Lt shoulder inferior glides g2 for pain relief, PROM range with distraction  Estim IFC to Lt shoulder to tolerance 10 15 mins with education of HEP.  Performed with moist heat on Lt shoulder as well.    PATIENT EDUCATION: 06/23/2024 Education details: HEP, POC Person educated: Patient Education method: Programmer, Multimedia, Demonstration, Verbal cues, and Handouts Education comprehension: verbalized understanding, returned demonstration, and verbal  cues required  HOME EXERCISE PROGRAM: Access Code: [email or web address] URL: [email or web address] Date: 07/06/2024 Prepared by: Grayce Spatz  Exercises - Seated Scapular Retraction  - 3-5 x daily - 7 x weekly - 1 sets - 10 reps - 3-5 hold - Seated Isometric Shoulder External Rotation with Towel (Mirrored)  - 1-2 x daily - 7 x weekly - 1 sets - 10 reps - 5 hold - Standing Isometric Shoulder Flexion with Doorway - Arm Bent (Mirrored)  - 1-2 x daily - 7 x weekly - 1 sets - 10 reps - 5 hold - Circular Shoulder Pendulum with Table Support  - 2 x daily - 7 x weekly - 1 sets - 10 reps - Flexion-Extension Shoulder Pendulum with Table Support  - 2 x daily - 7 x weekly - 1 sets - 10 reps - Horizontal Shoulder Pendulum with Table Support  - 2 x daily - 7 x weekly - 1 sets - 10 reps  ASSESSMENT:  CLINICAL IMPRESSION: Pt with limited tolerance today due to pain of 8/10 reported upon arrival. Pt's HR was measured and her rate was 72 bpm and oxygen saturation was 99% on room air. Pt tolerating gentle ROM exercises and manual therapy. Pt with good response to E-stim at her last visit and wishing to try it again for pain control. Passive left shoulder flexion to 108 degrees and ER to 55 degrees.  Pt is scheduled to see Dr. Burnetta tomorrow to go over her MRI results which are posted above and possible shoulder injection. Recommending continued PT as pt tolerates per treatment plan.      Possible rotator cuff integrity issues sees Dr. Burnetta on 12/23 for MRI results.   OBJECTIVE IMPAIRMENTS: decreased activity tolerance, decreased balance, decreased coordination, decreased endurance, decreased mobility, decreased ROM, decreased strength, hypomobility, increased fascial restrictions, impaired perceived functional ability, increased muscle spasms, impaired flexibility, impaired UE functional use, improper body mechanics, postural dysfunction, and pain.   ACTIVITY LIMITATIONS: carrying, lifting, sleeping,  bed mobility, bathing, toileting, dressing, self feeding, reach over head, and hygiene/grooming  PARTICIPATION LIMITATIONS: meal prep, cleaning, laundry, interpersonal relationship, driving, shopping, and community activity  PERSONAL FACTORS: Medical history: arthritis, chronic back  pain(stenosis), GERD, hyperlipidemia, HTN, OA, history of Lt knee troubles.  are also affecting patient's functional outcome.   REHAB POTENTIAL: Fair to good  CLINICAL DECISION MAKING: Evolving/moderate complexity  EVALUATION COMPLEXITY: Moderate   GOALS: Goals reviewed with patient? Yes  SHORT TERM GOALS: (target date for Short term goals are 3 weeks 07/14/2024)  1.Patient will demonstrate independent use of home exercise program to maintain progress from in clinic treatments. Goal status: MET 07/10/24  LONG TERM GOALS: (target dates for all long term goals are 10 weeks  09/01/2024 )   1. Patient will demonstrate/report pain at worst less than or equal to 2/10 to facilitate minimal limitation in daily activity secondary to pain symptoms. Goal status: Ongoing  07/10/2024   2. Patient will demonstrate independent use of home exercise program to facilitate ability to maintain/progress functional gains from skilled physical therapy services. Goal status: Ongoing  07/10/2024   3. Patient will demonstrate Patient specific functional scale avg > or = 7/10 to indicate reduced disability due to condition.  Goal status: Ongoing  07/10/2024 4.33 today   4.  Patient will demonstrate Lt  UE MMT 4/5 or greater throughout to facilitate lifting, reaching, carrying at [facility] in daily activity.   Goal status: Ongoing  07/04/2024   5.  Patient will demonstrate Lt  [facility] joint AROM WFL s symptoms to facilitate usual overhead reaching, self care, dressing at PLOF.    Goal status: Ongoing  06/30/2024   6.  Patient will demonstrate/report ability to sleep s restriction due to symptoms.  Goal status: Ongoing  07/10/2024      PLAN:  PT FREQUENCY: 1-2x/week  PT DURATION: 10 weeks  PLANNED INTERVENTIONS: Can include 02853- PT Re-evaluation, 97110-Therapeutic exercises, 97530- Therapeutic activity, 97112- Neuromuscular re-education, 97535- Self Care, 97140- Manual therapy, [id number]- Gait training, [phone]- Orthotic Fit/training, [phone]- Canalith repositioning, [id number]- Aquatic Therapy, [phone]- Electrical stimulation (unattended), [id number] Physical performance testing, 97016- Vasopneumatic device, [id number]- Ultrasound, [id number]- Traction (mechanical), [id number]- Ionotophoresis 4mg /ml Dexamethasone ,  79439 - Needle insertion w/o injection 1 or 2 muscles, 20561 - Needle insertion w/o injection 3 or more muscles.   Patient/Family education, Balance training, Stair training, Taping, Dry Needling, Joint mobilization, Joint manipulation, Spinal manipulation, Spinal mobilization, Scar mobilization, Vestibular training, Visual/preceptual remediation/compensation, DME instructions, Cryotherapy, and Moist heat.  All performed as medically necessary.  All included unless contraindicated  PLAN FOR NEXT SESSION:   shoulder ROM and pain reduction, Repeat E-stim as needed See how Dr. Burnetta visit went and injection?  Will need new electrodes if you use E-stim  Delon JONELLE Lunger, PT, MPT 07/10/2024, 10:17 AM  "

## 2024-07-11 ENCOUNTER — Encounter: Admitting: Physical Therapy

## 2024-07-11 ENCOUNTER — Ambulatory Visit (INDEPENDENT_AMBULATORY_CARE_PROVIDER_SITE_OTHER): Admitting: Sports Medicine

## 2024-07-11 ENCOUNTER — Other Ambulatory Visit: Payer: Self-pay

## 2024-07-11 ENCOUNTER — Encounter: Payer: Self-pay | Admitting: Sports Medicine

## 2024-07-11 DIAGNOSIS — M7552 Bursitis of left shoulder: Secondary | ICD-10-CM

## 2024-07-11 DIAGNOSIS — G8929 Other chronic pain: Secondary | ICD-10-CM | POA: Diagnosis not present

## 2024-07-11 DIAGNOSIS — M25512 Pain in left shoulder: Secondary | ICD-10-CM

## 2024-07-11 DIAGNOSIS — M12812 Other specific arthropathies, not elsewhere classified, left shoulder: Secondary | ICD-10-CM

## 2024-07-11 DIAGNOSIS — M75102 Unspecified rotator cuff tear or rupture of left shoulder, not specified as traumatic: Secondary | ICD-10-CM | POA: Diagnosis not present

## 2024-07-11 DIAGNOSIS — M19012 Primary osteoarthritis, left shoulder: Secondary | ICD-10-CM

## 2024-07-11 MED ORDER — BUPIVACAINE HCL 0.25 % IJ SOLN
2.0000 mL | INTRAMUSCULAR | Status: AC | PRN
  Administered 2024-07-11: 2 mL via INTRA_ARTICULAR

## 2024-07-11 MED ORDER — LIDOCAINE HCL 1 % IJ SOLN
4.0000 mL | INTRAMUSCULAR | Status: AC | PRN
  Administered 2024-07-11: 4 mL

## 2024-07-11 MED ORDER — METHYLPREDNISOLONE ACETATE 40 MG/ML IJ SUSP
40.0000 mg | INTRAMUSCULAR | Status: AC | PRN
  Administered 2024-07-11: 40 mg via INTRA_ARTICULAR

## 2024-07-11 NOTE — Progress Notes (Signed)
 "  Melissa Maldonado - 83 y.o. female MRN [id number]  Date of birth: 09/09/1940  Office Visit Note: Visit Date: 07/11/2024 PCP: Clarice Nottingham, MD Referred by: Clarice Nottingham, MD  Subjective: Chief Complaint  Patient presents with   Left Shoulder - Follow-up   HPI: Melissa Maldonado is a pleasant 83 y.o. female who presents today for chronic left shoulder pain, also MRI review.  Melissa Maldonado is here for follow-up of her left shoulder which continues to be rather bothersome.  Has pain with reaching, laying on that side.  She is not taking any specific medication for pain, does use Tylenol  as needed.  Is on Xarelto  so unable to take NSAIDs. Did undergo ultrasound-guided glenohumeral joint injection on 05/30/2024 which only gave her some relief.  Has been performing formalized physical therapy but does not feel it is significantly helpful.  Pertinent ROS were reviewed with the patient and found to be negative unless otherwise specified above in HPI.   Assessment & Plan: Visit Diagnoses:  1. Subacromial bursitis of left shoulder joint   2. Primary osteoarthritis, left shoulder   3. Rotator cuff tear arthropathy of left shoulder   4. Chronic left shoulder pain    Plan: Impression is chronic and persistent left shoulder pain which is multifactorial in nature.  She does have advanced osteoarthritic change as well as rather significant rotator cuff arthropathy with partial tearing of supraspinatus, infraspinatus and subscapularis.  Previous ultrasound-guided injection did not provide her significant or lasting relief.  She does have bursitis noted on examination, I do think this is causing her acute pain.  Through shared decision making, we did proceed with ultrasound-guided SAJ aspiration and subsequent injection.  Patient tolerated well, advised on postinjection protocol.  May use ice/heat and/or Tylenol  for any postinjection pain.  I would like her to hold on physical therapy over the next week and then may  slowly return based on how her pain is doing with PT versus HEP.  I would like to see her back in about 6 weeks to reevaluate how she is doing.  Hopefully the pain is settled down and she has been able to progress through PT.  If she is still having rather significant and/or debilitating pain, next step would likely be referral to Dr. Addie as reverse total shoulder arthroplasty would be a more permanent solution. Will discuss more at follow-up.   Follow-up: Return in about 6 weeks (around 08/22/2024) for L-shoulder.   Meds & Orders: No orders of the defined types were placed in this encounter.   Orders Placed This Encounter  Procedures   Large Joint Inj: L subacromial bursa   US  Guided Needle Placement - No Linked Charges     Procedures: Large Joint Inj: L subacromial bursa on 07/11/2024 9:02 AM Indications: pain and joint swelling Details: 18 G 1.5 in needle, ultrasound-guided lateral approach Medications: 4 mL lidocaine  1 %; 2 mL bupivacaine  0.25 %; 40 mg methylPREDNISolone  acetate 40 MG/ML Aspirate: 8 mL clear and yellow Outcome: tolerated well, no immediate complications  *Procedurally successful ultrasound-guided left shoulder (SAJ) aspiration and subsequent injection Procedure, treatment alternatives, risks and benefits explained, specific risks discussed. Consent was given by the patient. Immediately prior to procedure a time out was called to verify the correct patient, procedure, equipment, support staff and site/side marked as required. Patient was prepped and draped in the usual sterile fashion.          Clinical History: No specialty comments available.  She reports that she  has never smoked. She has never used smokeless tobacco. No results for input(s): HGBA1C, LABURIC in the last 8760 hours.  Objective:    Physical Exam  Gen: Well-appearing, in no acute distress; non-toxic CV: Well-perfused. Warm.  Resp: Breathing unlabored on room air; no wheezing. Psych: Fluid  speech in conversation; appropriate affect; normal thought process  Ortho Exam - Left shoulder: There is a moderate effusion within the left shoulder.  There is pain through all planes of motion.  There is pain and weakness with resisted abduction and ER.  There is crepitus at end range of motion with bony restriction.  Imaging:  *I did independently review the left shoulder MRI and did review this with the patient in the room today.  MR Shoulder Left w/o contrast CLINICAL DATA:  Left shoulder pain.  EXAM: MRI OF THE LEFT SHOULDER WITHOUT CONTRAST  TECHNIQUE: Multiplanar, multisequence MR imaging of the shoulder was performed. No intravenous contrast was administered.  COMPARISON:  None Available.  FINDINGS: Rotator cuff: Severe tendinosis of the supraspinatus tendon with a shallow partial-thickness bursal surface tear towards the anterior insertion. Severe infraspinatus tendinosis with a interstitial tear measuring 16 mm medial-lateral and 7 mm AP towards the anterior aspect. Teres minor tendon is intact. Severe subscapularis tendinosis with a small partial-thickness articular surface tear.  Muscles: No muscle atrophy or edema. No intramuscular fluid collection or hematoma.  Biceps Long Head: Moderate tendinosis of the intra-articular portion of the long head of the biceps tendon.  Acromioclavicular Joint: Mild arthropathy of the acromioclavicular joint. Large amount of subacromial/subdeltoid bursal fluid.  Glenohumeral Joint: Small joint effusion with synovitis in the axillary recess. High-grade partial-thickness cartilage loss with areas of full-thickness cartilage loss of the glenohumeral joint and subchondral cystic changes in the glenoid.  Labrum: Degenerative tearing of the superior labrum.  Bones: No fracture or dislocation. No aggressive osseous lesion.  Other: No fluid collection or hematoma.  IMPRESSION: 1. Severe tendinosis of the supraspinatus tendon  with a shallow partial-thickness bursal surface tear towards the anterior insertion. 2. Severe infraspinatus tendinosis with a interstitial tear measuring 16 mm medial-lateral and 7 mm AP towards the anterior aspect. 3. Severe subscapularis tendinosis with a small partial-thickness articular surface tear. 4. Moderate tendinosis of the intra-articular portion of the long head of the biceps tendon. 5. Severe subacromial/subdeltoid bursitis. 6. Moderate-severe osteoarthritis of the glenohumeral joint.  Electronically Signed   By: Julaine Blanch M.D.   On: 06/27/2024 10:50    Past Medical/Family/Surgical/Social History: Medications & Allergies reviewed per EMR, new medications updated. Patient Active Problem List   Diagnosis Date Noted   Persistent atrial fibrillation (HCC) 01/25/2023   Hypercoagulable state due to persistent atrial fibrillation (HCC) 01/25/2023   Status post total right knee replacement 11/06/2022   Arthritis of right acromioclavicular joint 08/28/2021   Impingement syndrome of right shoulder 08/19/2021   Nontraumatic incomplete tear of right rotator cuff 08/19/2021   Paresthesia 12/05/2020   Low back pain with right-sided sciatica 12/05/2020   Trochanteric bursitis, right hip 04/24/2020   Essential hypertension 01/12/2020   Sinus bradycardia 01/12/2020   Dyspnea on exertion 01/12/2020   Lumbar stenosis with neurogenic claudication 02/01/2017   Osteoarthritis of left knee 04/27/2014   Past Medical History:  Diagnosis Date   Arthritis    Chronic back pain    lumbar stenosis   Dysrhythmia    Early cataracts, bilateral    GERD (gastroesophageal reflux disease)    occasionally but no meds required   History of blood  transfusion    History of colon polyps    Hyperlipidemia    takes Lipitor every other day   Hypertension    takes losartan  daily   Neuropathy    Osteoarthritis of left knee 04/27/2014   Restless leg    Shortness of breath    with exertion    SOB (shortness of breath) 2013   CPET   Tear of medial meniscus of left knee 09/08/2013   Family History  Problem Relation Age of Onset   Lupus Mother    Heart attack Father    Past Surgical History:  Procedure Laterality Date   APPENDECTOMY     BACK SURGERY  07/20/2008   lumb fusion   CARDIOVERSION N/A 01/29/2023   Procedure: CARDIOVERSION;  Surgeon: Alvan Ronal BRAVO, MD;  Location: [facility];  Service: Cardiovascular;  Laterality: N/A;   COLONOSCOPY  2018   EYE SURGERY     both cataracts   KNEE ARTHROSCOPY WITH MEDIAL MENISECTOMY Left 09/08/2013   Procedure: LEFT KNEE ARTHROSCOPY WITH PARTIAL MEDIAL MENISCECTOMY;  Surgeon: Fonda SHAUNNA Olmsted, MD;  Location: [facility];  Service: Orthopedics;  Laterality: Left;   LUMBAR LAMINECTOMY/DECOMPRESSION MICRODISCECTOMY  05/13/2012   Procedure: LUMBAR LAMINECTOMY/DECOMPRESSION MICRODISCECTOMY 1 LEVEL;  Surgeon: Victory Gens, MD;  Location: [facility];  Service: Neurosurgery;  Laterality: Bilateral;  Bilateral Lumbar one -two Decompressive Laminectomy   PARTIAL KNEE ARTHROPLASTY Left 04/27/2014   Procedure: LEFT UNICOMPARTMENTAL KNEE;  Surgeon: Fonda SHAUNNA Olmsted, MD;  Location: [facility];  Service: Orthopedics;  Laterality: Left;   TOTAL KNEE ARTHROPLASTY Right 11/06/2022   Procedure: RIGHT TOTAL KNEE ARTHROPLASTY;  Surgeon: Vernetta Lonni GRADE, MD;  Location: [facility];  Service: Orthopedics;  Laterality: Right;   TUBAL LIGATION  1974   UPPER GI ENDOSCOPY     Social History   Occupational History   Occupation: Retired  Tobacco Use   Smoking status: Never   Smokeless tobacco: Never   Tobacco comments:    Never smoked 05/31/24  Vaping Use   Vaping status: Never Used  Substance and Sexual Activity   Alcohol use: Not Currently    Comment: socially wine   Drug use: Never   Sexual activity: Yes    Birth control/protection: Post-menopausal   "

## 2024-07-18 ENCOUNTER — Encounter: Admitting: Physical Therapy

## 2024-07-21 ENCOUNTER — Ambulatory Visit (INDEPENDENT_AMBULATORY_CARE_PROVIDER_SITE_OTHER): Admitting: Rehabilitative and Restorative Service Providers"

## 2024-07-21 ENCOUNTER — Encounter: Admitting: Rehabilitative and Restorative Service Providers"

## 2024-07-21 DIAGNOSIS — G8929 Other chronic pain: Secondary | ICD-10-CM

## 2024-07-21 DIAGNOSIS — M25512 Pain in left shoulder: Secondary | ICD-10-CM | POA: Diagnosis not present

## 2024-07-21 DIAGNOSIS — M6281 Muscle weakness (generalized): Secondary | ICD-10-CM

## 2024-07-21 DIAGNOSIS — R293 Abnormal posture: Secondary | ICD-10-CM | POA: Diagnosis not present

## 2024-07-21 NOTE — Therapy (Signed)
 " OUTPATIENT PHYSICAL THERAPY TREATMENT    Patient Name: Melissa Maldonado MRN: [id number] DOB:02-Feb-1941, 84 y.o., female Today's Date: 07/21/2024  END OF SESSION:  PT End of Session - 07/21/24 0854     Visit Number 5    Number of Visits 20    Date for Recertification  09/01/24    Authorization Type Medicare    Authorization Time Period --    Progress Note Due on Visit 14    PT Start Time [phone]    PT Stop Time 0930    PT Time Calculation (min) 38 min    Activity Tolerance Patient limited by pain    Behavior During Therapy [facility] for tasks assessed/performed              Past Medical History:  Diagnosis Date   Arthritis    Chronic back pain    lumbar stenosis   Dysrhythmia    Early cataracts, bilateral    GERD (gastroesophageal reflux disease)    occasionally but no meds required   History of blood transfusion    History of colon polyps    Hyperlipidemia    takes Lipitor every other day   Hypertension    takes losartan  daily   Neuropathy    Osteoarthritis of left knee 04/27/2014   Restless leg    Shortness of breath    with exertion   SOB (shortness of breath) 2013   CPET   Tear of medial meniscus of left knee 09/08/2013   Past Surgical History:  Procedure Laterality Date   APPENDECTOMY     BACK SURGERY  07/20/2008   lumb fusion   CARDIOVERSION N/A 01/29/2023   Procedure: CARDIOVERSION;  Surgeon: Alvan Ronal BRAVO, MD;  Location: [facility];  Service: Cardiovascular;  Laterality: N/A;   COLONOSCOPY  2018   EYE SURGERY     both cataracts   KNEE ARTHROSCOPY WITH MEDIAL MENISECTOMY Left 09/08/2013   Procedure: LEFT KNEE ARTHROSCOPY WITH PARTIAL MEDIAL MENISCECTOMY;  Surgeon: Fonda SHAUNNA Olmsted, MD;  Location: [facility];  Service: Orthopedics;  Laterality: Left;   LUMBAR LAMINECTOMY/DECOMPRESSION MICRODISCECTOMY  05/13/2012   Procedure: LUMBAR LAMINECTOMY/DECOMPRESSION MICRODISCECTOMY 1 LEVEL;  Surgeon: Victory Gens, MD;  Location: [facility];   Service: Neurosurgery;  Laterality: Bilateral;  Bilateral Lumbar one -two Decompressive Laminectomy   PARTIAL KNEE ARTHROPLASTY Left 04/27/2014   Procedure: LEFT UNICOMPARTMENTAL KNEE;  Surgeon: Fonda SHAUNNA Olmsted, MD;  Location: [facility];  Service: Orthopedics;  Laterality: Left;   TOTAL KNEE ARTHROPLASTY Right 11/06/2022   Procedure: RIGHT TOTAL KNEE ARTHROPLASTY;  Surgeon: Vernetta Lonni GRADE, MD;  Location: [facility];  Service: Orthopedics;  Laterality: Right;   TUBAL LIGATION  1974   UPPER GI ENDOSCOPY     Patient Active Problem List   Diagnosis Date Noted   Persistent atrial fibrillation (HCC) 01/25/2023   Hypercoagulable state due to persistent atrial fibrillation (HCC) 01/25/2023   Status post total right knee replacement 11/06/2022   Arthritis of right acromioclavicular joint 08/28/2021   Impingement syndrome of right shoulder 08/19/2021   Nontraumatic incomplete tear of right rotator cuff 08/19/2021   Paresthesia 12/05/2020   Low back pain with right-sided sciatica 12/05/2020   Trochanteric bursitis, right hip 04/24/2020   Essential hypertension 01/12/2020   Sinus bradycardia 01/12/2020   Dyspnea on exertion 01/12/2020   Lumbar stenosis with neurogenic claudication 02/01/2017   Osteoarthritis of left knee 04/27/2014    PCP: Clarice Nottingham MD  REFERRING PROVIDER: Burnetta Brunet,  DO  REFERRING DIAG: M25.512,G89.29 (ICD-10-CM) - Chronic left shoulder pain M19.012 (ICD-10-CM) - Primary osteoarthritis, left shoulder M75.102,M12.812 (ICD-10-CM) - Rotator cuff tear arthropathy of left shoulder  THERAPY DIAG:  Chronic left shoulder pain  Muscle weakness (generalized)  Abnormal posture  Rationale for Evaluation and Treatment: Rehabilitation  ONSET DATE: Oct or Nov 2025.   SUBJECTIVE:                                                                                                                                                                                       SUBJECTIVE STATEMENT: Pt indicated feeling some improvement in symptoms but still hurting.   Rt hand dominant.   PERTINENT HISTORY: Medical history: arthritis, chronic back pain(stenosis), GERD, hyperlipidemia, HTN, OA, history of Lt knee troubles.   PAIN:  NPRS scale:  5/10 Pain location: Lt shoulder, anterior/lateral shoulder  Pain description: sore, constant pain Aggravating factors: reaching, lifting, getting dressed.  Relieving factors: topical cream helps short term  PRECAUTIONS: None  WEIGHT BEARING RESTRICTIONS: No  FALLS:  Has patient fallen in last 6 months? No  LIVING ENVIRONMENT: Lives in: House/apartment  OCCUPATION: Retired  PLOF: Independent, go to gym.   PATIENT GOALS: Reduce pain,    OBJECTIVE:   DIAGNOSTIC FINDINGS:  05/30/2024 xray:   X-rays of the shoulder show significant degenerative spurring of the  glenohumeral joint with significant joint space narrowing.   06/27/24:  IMPRESSION: 1. Severe tendinosis of the supraspinatus tendon with a shallow partial-thickness bursal surface tear towards the anterior insertion. 2. Severe infraspinatus tendinosis with a interstitial tear measuring 16 mm medial-lateral and 7 mm AP towards the anterior aspect. 3. Severe subscapularis tendinosis with a small partial-thickness articular surface tear. 4. Moderate tendinosis of the intra-articular portion of the long head of the biceps tendon. 5. Severe subacromial/subdeltoid bursitis. 6. Moderate-severe osteoarthritis of the glenohumeral joint.  PATIENT SURVEYS:  Patient-Specific Activity Scoring Scheme  0 represents unable to perform. 10 represents able to perform at prior level. 0 1 2 3 4 5 6 7 8 9  10 (Date and Score)   Activity Eval  06/23/2024 07/10/24   1. sleep  4 6   2. Getting dressing   2  7  3. Reaching, lifting household 0 0  Score 2 avg 4.33   Total score = sum of the activity scores/number of activities Minimum detectable  change (90%CI) for average score = 2 points Minimum detectable change (90%CI) for single activity score = 3 points  COGNITION: 06/23/2024 Overall cognitive status: WFL     SENSATION: 06/23/2024 WFL  POSTURE: 06/23/2024 Rounded shoulders, FHP.   UPPER EXTREMITY ROM:   ROM Left Eval 06/23/2024  Left 07/06/24 07/10/24 Left Left 07/21/2024  Shoulder flexion < 20 deg with pain against gravity in supine  120 PROM in supine with pain Supine P: 144* A: 30* limited by pain & weakness Supine P: 108 110 AROM against gravity in sitting.   132 AROM in supine  Shoulder extension      Shoulder abduction      Shoulder adduction      Shoulder internal rotation 90 deg PROM in supine 45 deg abduction Supine P: 55*  85 AROM in 45 deg abduction supine  Shoulder external rotation 10 deg PROM with pain, in supine 45 deg abduction Supine P: 66* Supine A: 50 P: 55 70 AROM in 45 deg abduction supine  Elbow flexion      Elbow extension      Wrist flexion      Wrist extension      Wrist ulnar deviation      Wrist radial deviation      Wrist pronation      Wrist supination      (Blank rows = not tested)  UPPER EXTREMITY MMT:  MMT Right Eval 06/23/2024 Left Eval 06/23/2024 Left 07/21/2024  Shoulder flexion 5/5 1+/5 2+/5  Shoulder extension     Shoulder abduction 5/5 1+/5   Shoulder adduction     Shoulder internal rotation 5/5 4/5   Shoulder external rotation 5/5 1+/5 3/5  Middle trapezius     Lower trapezius     Elbow flexion 5/5    Elbow extension 5/5    Wrist flexion     Wrist extension     Wrist ulnar deviation     Wrist radial deviation     Wrist pronation     Wrist supination     Grip strength (lbs)     (Blank rows = not tested)  SHOULDER SPECIAL TESTS: 06/23/2024 (+) Painful arc, drop arm, ER lag testing Lt arm  JOINT MOBILITY TESTING:  07/21/2024: Normal mobility in mid ranges, less muscle guarding to passive movement assessment compared to eval.   06/23/2024 No limits  noted in inferior and posterior glides Lt shoulder.   PALPATION:  06/23/2024 Tenderness to light touch around Lt shoulder joint.                                                                                                                                                                                                   TODAY'S TREATMENT:  DATE:  07/21/2024 Therex: Supine Lt shoulder AROM x 20 (added to home program), x 10 with 1 lb weight Sidelying Lt shoulder AROM 2 x 7 Sidelying Lt shoulder ER 2x 15 c towel under arm  Discussed pain adapting response to exercise with any rep or weight changes.    Neuro Re-ed (muscle activation, postural control, scapular control) Supine Lt shoulder in 90 deg flexion with mild resistance stabilizations 20-25 second bouts  Standing green band rows with scapular retraction focus 2 x 15 Standing green band GH ext bilaterally, 2 x 15  Standing Lt shoulder ER with towel under arm reactive walk out green band 5 sec hold x 10   Manual: Supine Lt shoulder inferior g2 GH joint glides, Posterior glide with passive ER mobilization with movement.    TODAY'S TREATMENT:                                                                                                       DATE: 07/10/2024 Therapeutic Exercise: Standing pendulums x 1 min each: A/P, lateral, Circles CCW/CW Lt bicep curls 2x10; 1# Seated Upper trap stretch x 4 holding 10 sec Therapeutic Activities: Seated UE ranger forward reaching 2 x 10 Seated UE ranger scaption reaching 2 x 10  Seated UE ranger circles both directions x 20  Seated scapular retraction x 10 holding 5 sec Sit to supine and supine to sit, c mild pain in left shoulder noted, pt able to perform independent with increased time to control pain in left shoulder Manual Lt shoulder A/P, inferior glides g2 for pain relief, PROM range with  distraction Estim IFC to Lt shoulder with 4 electrodes crossing GH intensity to pt's tolreance x 15 mins. Performed with moist heat on Lt shoulder as well.    TODAY'S TREATMENT:                                                                                                       DATE: 07/06/2024 Therapeutic Exercise: Following Nustep pt performed Pendulum exercises flexion/extension, abduction/adduction and circles CW/CCW.  PT tactile cues for range.  Patient did report decrease in pain following pendulum exercises. Active internal and external rotation with pronation /  supination supine with elbow extended 15 reps Supine bicep curls with bicep stretch.   Therapeutic Activities: Posture set prior to activity Cervical retraction seated 10 reps with tactile cues. Posture set prior to activity Scapular retraction 10 reps with tactile cues to limit GH ext. Nustep with BUEs & BLEs seat 7 level 4 for 8 min with focus on full motion to facilitate arm motion. Reports left shoulder pain 8/10 at beginning and 7/10 at end.  Manual Lt shoulder inferior glides g2 for pain relief, PROM range with distraction  Estim IFC to Lt shoulder with 4 electrodes crossing GH to tolerance at 18 for 10 mins. Performed with moist heat on Lt shoulder as well.    TREATMENT:                                                                                                       DATE: 07/04/2024 Therapeutic Exercise: Cervical retraction seated 10 reps with tactile cues. Scapular retraction 10 reps Pendulum exercises flexion/extension, abduction/adduction and circles CW/CCW.  PT tactile cues for range.  Patient did report decrease in pain following pendulum exercises. Active internal and external rotation with pronation /  supination supine with elbow extended 15 reps Supine bicep curls with bicep stretch.   Self-care: PT demo and verbal cues on positioning in supine and side-lying.  Patient verbalized  understanding. PT demo verbal cues on positioning and sitting to support upper extremity with proper posture.  Patient verbalized understanding.  Manual Lt shoulder inferior glides g2 for pain relief, PROM range with distraction   PATIENT EDUCATION: 07/21/2024 Education details: HEP update Person educated: Patient Education method: Programmer, Multimedia, Demonstration, Verbal cues, and Handouts Education comprehension: verbalized understanding, returned demonstration, and verbal cues required  HOME EXERCISE PROGRAM: Access Code: [email or web address] URL: [email or web address] Date: 07/21/2024 Prepared by: Ozell Silvan  Exercises - Seated Scapular Retraction  - 3-5 x daily - 7 x weekly - 1 sets - 10 reps - 3-5 hold - Seated Isometric Shoulder External Rotation with Towel (Mirrored)  - 1-2 x daily - 7 x weekly - 1 sets - 10 reps - 5 hold - Standing Isometric Shoulder Flexion with Doorway - Arm Bent (Mirrored)  - 1-2 x daily - 7 x weekly - 1 sets - 10 reps - 5 hold - Circular Shoulder Pendulum with Table Support  - 2 x daily - 7 x weekly - 1 sets - 10 reps - Supine Shoulder Flexion Extension Full Range AROM (Mirrored)  - 1-2 x daily - 7 x weekly - 1-2 sets - 10-20 reps - Sidelying Shoulder Abduction Palm Forward (Mirrored)  - 1-2 x daily - 7 x weekly - 2-3 sets - 10-15 reps - Sidelying Shoulder External Rotation  - 1-2 x daily - 7 x weekly - 2-3 sets - 10-15 reps  ASSESSMENT:  CLINICAL IMPRESSION: Update of AROM noted improvement in Lt shoulder range in all directions with reduced symptoms.  Intro of lying down AROM for muscle activation, endurance in clinic with additions to HEP.  Cues for adjustment of reps based off difficulty/symptom response.   OBJECTIVE IMPAIRMENTS: decreased activity tolerance, decreased balance, decreased coordination, decreased endurance, decreased mobility, decreased ROM, decreased strength, hypomobility, increased fascial restrictions, impaired perceived functional  ability, increased muscle spasms, impaired flexibility, impaired UE functional use, improper body mechanics, postural dysfunction, and pain.   ACTIVITY LIMITATIONS: carrying, lifting, sleeping, bed mobility, bathing, toileting, dressing, self feeding, reach over head, and hygiene/grooming  PARTICIPATION LIMITATIONS: meal prep, cleaning, laundry, interpersonal relationship, driving, shopping, and community activity  PERSONAL FACTORS: Medical history:  arthritis, chronic back pain(stenosis), GERD, hyperlipidemia, HTN, OA, history of Lt knee troubles.  are also affecting patient's functional outcome.   REHAB POTENTIAL: Fair to good  CLINICAL DECISION MAKING: Evolving/moderate complexity  EVALUATION COMPLEXITY: Moderate   GOALS: Goals reviewed with patient? Yes  SHORT TERM GOALS: (target date for Short term goals are 3 weeks 07/14/2024)  1.Patient will demonstrate independent use of home exercise program to maintain progress from in clinic treatments. Goal status: MET 07/10/24  LONG TERM GOALS: (target dates for all long term goals are 10 weeks  09/01/2024 )   1. Patient will demonstrate/report pain at worst less than or equal to 2/10 to facilitate minimal limitation in daily activity secondary to pain symptoms. Goal status: Ongoing  07/10/2024   2. Patient will demonstrate independent use of home exercise program to facilitate ability to maintain/progress functional gains from skilled physical therapy services. Goal status: Ongoing  07/10/2024   3. Patient will demonstrate Patient specific functional scale avg > or = 7/10 to indicate reduced disability due to condition.  Goal status: Ongoing  07/10/2024 4.33 today   4.  Patient will demonstrate Lt  UE MMT 4/5 or greater throughout to facilitate lifting, reaching, carrying at [facility] in daily activity.   Goal status: Ongoing  07/04/2024   5.  Patient will demonstrate Lt  [facility] joint AROM WFL s symptoms to facilitate usual overhead reaching,  self care, dressing at PLOF.    Goal status: Ongoing  06/30/2024   6.  Patient will demonstrate/report ability to sleep s restriction due to symptoms.  Goal status: Ongoing  07/10/2024     PLAN:  PT FREQUENCY: 1-2x/week  PT DURATION: 10 weeks  PLANNED INTERVENTIONS: Can include 02853- PT Re-evaluation, 97110-Therapeutic exercises, 97530- Therapeutic activity, 97112- Neuromuscular re-education, 97535- Self Care, 97140- Manual therapy, [phone]- Gait training, [phone]- Orthotic Fit/training, [phone]- Canalith repositioning, [id number]- Aquatic Therapy, [phone]- Electrical stimulation (unattended), [id number] Physical performance testing, 97016- Vasopneumatic device, [id number]- Ultrasound, [id number]- Traction (mechanical), [id number]- Ionotophoresis 4mg /ml Dexamethasone ,  [id number] - Needle insertion w/o injection 1 or 2 muscles, 20561 - Needle insertion w/o injection 3 or more muscles.   Patient/Family education, Balance training, Stair training, Taping, Dry Needling, Joint mobilization, Joint manipulation, Spinal manipulation, Spinal mobilization, Scar mobilization, Vestibular training, Visual/preceptual remediation/compensation, DME instructions, Cryotherapy, and Moist heat.  All performed as medically necessary.  All included unless contraindicated  PLAN FOR NEXT SESSION:   AROM gains with early light resistance as able. Watch for pain response and no shrug in activity.     Ozell Silvan, PT, DPT, OCS, ATC 07/21/2024  9:29 AM   "

## 2024-07-24 ENCOUNTER — Ambulatory Visit (INDEPENDENT_AMBULATORY_CARE_PROVIDER_SITE_OTHER): Admitting: Rehabilitative and Restorative Service Providers"

## 2024-07-24 ENCOUNTER — Encounter: Payer: Self-pay | Admitting: Rehabilitative and Restorative Service Providers"

## 2024-07-24 DIAGNOSIS — R293 Abnormal posture: Secondary | ICD-10-CM

## 2024-07-24 DIAGNOSIS — G8929 Other chronic pain: Secondary | ICD-10-CM

## 2024-07-24 DIAGNOSIS — M25512 Pain in left shoulder: Secondary | ICD-10-CM | POA: Diagnosis not present

## 2024-07-24 DIAGNOSIS — M6281 Muscle weakness (generalized): Secondary | ICD-10-CM

## 2024-07-24 NOTE — Therapy (Signed)
 " OUTPATIENT PHYSICAL THERAPY TREATMENT    Patient Name: Melissa Maldonado MRN: [id number] DOB:01/29/1941, 84 y.o., female Today's Date: 07/24/2024  END OF SESSION:  PT End of Session - 07/24/24 0838     Visit Number 6    Number of Visits 20    Date for Recertification  09/01/24    Authorization Type Medicare    Progress Note Due on Visit 14    PT Start Time 0846    PT Stop Time 0925    PT Time Calculation (min) 39 min    Activity Tolerance Patient limited by fatigue    Behavior During Therapy [facility] for tasks assessed/performed            Past Medical History:  Diagnosis Date   Arthritis    Chronic back pain    lumbar stenosis   Dysrhythmia    Early cataracts, bilateral    GERD (gastroesophageal reflux disease)    occasionally but no meds required   History of blood transfusion    History of colon polyps    Hyperlipidemia    takes Lipitor every other day   Hypertension    takes losartan  daily   Neuropathy    Osteoarthritis of left knee 04/27/2014   Restless leg    Shortness of breath    with exertion   SOB (shortness of breath) 2013   CPET   Tear of medial meniscus of left knee 09/08/2013   Past Surgical History:  Procedure Laterality Date   APPENDECTOMY     BACK SURGERY  07/20/2008   lumb fusion   CARDIOVERSION N/A 01/29/2023   Procedure: CARDIOVERSION;  Surgeon: Alvan Ronal BRAVO, MD;  Location: [facility];  Service: Cardiovascular;  Laterality: N/A;   COLONOSCOPY  2018   EYE SURGERY     both cataracts   KNEE ARTHROSCOPY WITH MEDIAL MENISECTOMY Left 09/08/2013   Procedure: LEFT KNEE ARTHROSCOPY WITH PARTIAL MEDIAL MENISCECTOMY;  Surgeon: Fonda SHAUNNA Olmsted, MD;  Location: [facility];  Service: Orthopedics;  Laterality: Left;   LUMBAR LAMINECTOMY/DECOMPRESSION MICRODISCECTOMY  05/13/2012   Procedure: LUMBAR LAMINECTOMY/DECOMPRESSION MICRODISCECTOMY 1 LEVEL;  Surgeon: Victory Gens, MD;  Location: [facility];  Service: Neurosurgery;   Laterality: Bilateral;  Bilateral Lumbar one -two Decompressive Laminectomy   PARTIAL KNEE ARTHROPLASTY Left 04/27/2014   Procedure: LEFT UNICOMPARTMENTAL KNEE;  Surgeon: Fonda SHAUNNA Olmsted, MD;  Location: [facility];  Service: Orthopedics;  Laterality: Left;   TOTAL KNEE ARTHROPLASTY Right 11/06/2022   Procedure: RIGHT TOTAL KNEE ARTHROPLASTY;  Surgeon: Vernetta Lonni GRADE, MD;  Location: [facility];  Service: Orthopedics;  Laterality: Right;   TUBAL LIGATION  1974   UPPER GI ENDOSCOPY     Patient Active Problem List   Diagnosis Date Noted   Persistent atrial fibrillation (HCC) 01/25/2023   Hypercoagulable state due to persistent atrial fibrillation (HCC) 01/25/2023   Status post total right knee replacement 11/06/2022   Arthritis of right acromioclavicular joint 08/28/2021   Impingement syndrome of right shoulder 08/19/2021   Nontraumatic incomplete tear of right rotator cuff 08/19/2021   Paresthesia 12/05/2020   Low back pain with right-sided sciatica 12/05/2020   Trochanteric bursitis, right hip 04/24/2020   Essential hypertension 01/12/2020   Sinus bradycardia 01/12/2020   Dyspnea on exertion 01/12/2020   Lumbar stenosis with neurogenic claudication 02/01/2017   Osteoarthritis of left knee 04/27/2014    PCP: Clarice Nottingham MD  REFERRING PROVIDER: Burnetta Brunet, DO  REFERRING DIAG: [phone] (ICD-10-CM) - Chronic left  shoulder pain M19.012 (ICD-10-CM) - Primary osteoarthritis, left shoulder M75.102,M12.812 (ICD-10-CM) - Rotator cuff tear arthropathy of left shoulder  THERAPY DIAG:  Chronic left shoulder pain  Muscle weakness (generalized)  Abnormal posture  Rationale for Evaluation and Treatment: Rehabilitation  ONSET DATE: Oct or Nov 2025.   SUBJECTIVE:                                                                                                                                                                                      SUBJECTIVE  STATEMENT: Pt indicated soreness complaints in shoulder from weekend with new activities in HEP.    Rt hand dominant.   PERTINENT HISTORY: Medical history: arthritis, chronic back pain(stenosis), GERD, hyperlipidemia, HTN, OA, history of Lt knee troubles.   PAIN:  NPRS scale:  soreness 4/10 Pain location: Lt shoulder, anterior/lateral shoulder  Pain description: sore, constant pain Aggravating factors: reaching, lifting, getting dressed.  Relieving factors: topical cream helps short term  PRECAUTIONS: None  WEIGHT BEARING RESTRICTIONS: No  FALLS:  Has patient fallen in last 6 months? No  LIVING ENVIRONMENT: Lives in: House/apartment  OCCUPATION: Retired  PLOF: Independent, go to gym.   PATIENT GOALS: Reduce pain,    OBJECTIVE:   DIAGNOSTIC FINDINGS:  05/30/2024 xray:   X-rays of the shoulder show significant degenerative spurring of the  glenohumeral joint with significant joint space narrowing.   06/27/24:  IMPRESSION: 1. Severe tendinosis of the supraspinatus tendon with a shallow partial-thickness bursal surface tear towards the anterior insertion. 2. Severe infraspinatus tendinosis with a interstitial tear measuring 16 mm medial-lateral and 7 mm AP towards the anterior aspect. 3. Severe subscapularis tendinosis with a small partial-thickness articular surface tear. 4. Moderate tendinosis of the intra-articular portion of the long head of the biceps tendon. 5. Severe subacromial/subdeltoid bursitis. 6. Moderate-severe osteoarthritis of the glenohumeral joint.  PATIENT SURVEYS:  Patient-Specific Activity Scoring Scheme  0 represents unable to perform. 10 represents able to perform at prior level. 0 1 2 3 4 5 6 7 8 9  10 (Date and Score)   Activity Eval  06/23/2024 07/10/24   1. sleep  4 6   2. Getting dressing   2  7  3. Reaching, lifting household 0 0  Score 2 avg 4.33   Total score = sum of the activity scores/number of  activities Minimum detectable change (90%CI) for average score = 2 points Minimum detectable change (90%CI) for single activity score = 3 points  COGNITION: 06/23/2024 Overall cognitive status: WFL     SENSATION: 06/23/2024 WFL  POSTURE: 06/23/2024 Rounded shoulders, FHP.   UPPER EXTREMITY ROM:   ROM Left Eval 06/23/2024 Left 07/06/24 07/10/24 Left  Left 07/21/2024  Shoulder flexion < 20 deg with pain against gravity in supine  120 PROM in supine with pain Supine P: 144* A: 30* limited by pain & weakness Supine P: 108 110 AROM against gravity in sitting.   132 AROM in supine  Shoulder extension      Shoulder abduction      Shoulder adduction      Shoulder internal rotation 90 deg PROM in supine 45 deg abduction Supine P: 55*  85 AROM in 45 deg abduction supine  Shoulder external rotation 10 deg PROM with pain, in supine 45 deg abduction Supine P: 66* Supine A: 50 P: 55 70 AROM in 45 deg abduction supine  Elbow flexion      Elbow extension      Wrist flexion      Wrist extension      Wrist ulnar deviation      Wrist radial deviation      Wrist pronation      Wrist supination      (Blank rows = not tested)  UPPER EXTREMITY MMT:  MMT Right Eval 06/23/2024 Left Eval 06/23/2024 Left 07/21/2024  Shoulder flexion 5/5 1+/5 2+/5  Shoulder extension     Shoulder abduction 5/5 1+/5   Shoulder adduction     Shoulder internal rotation 5/5 4/5   Shoulder external rotation 5/5 1+/5 3/5  Middle trapezius     Lower trapezius     Elbow flexion 5/5    Elbow extension 5/5    Wrist flexion     Wrist extension     Wrist ulnar deviation     Wrist radial deviation     Wrist pronation     Wrist supination     Grip strength (lbs)     (Blank rows = not tested)  SHOULDER SPECIAL TESTS: 06/23/2024 (+) Painful arc, drop arm, ER lag testing Lt arm  JOINT MOBILITY TESTING:  07/21/2024: Normal mobility in mid ranges, less muscle guarding to passive movement assessment compared to  eval.   06/23/2024 No limits noted in inferior and posterior glides Lt shoulder.   PALPATION:  06/23/2024 Tenderness to light touch around Lt shoulder joint.                                                                                                                                                                                                   TODAY'S TREATMENT:  DATE:  07/24/2024 Therex: UBE fwd/back 3 mins each way lvl 1.0 Supine Lt shoulder AROM flexion 2 x 15  Supine 2 lb wand flexion AAROM stretch 2-3 sec hold 2 x 10  Sidelying Lt shoulder abduction 2 x 10 Sidelying Lt shoulder ER with towel under arm 2 x 15   Neuro Re-ed (muscle activation, postural control, scapular control) Standing green band rows bilateral 2 x 15 with scapular retraction focus Standing green band gh ext 2 x 15 bilaterally  Supine SA press in 90 deg flexion with tactile cues 3 sec hold x 10 bilaterally performed.    TODAY'S TREATMENT:                                                                                                       DATE:  07/21/2024 Therex: Supine Lt shoulder AROM x 20 (added to home program), x 10 with 1 lb weight Sidelying Lt shoulder AROM 2 x 7 Sidelying Lt shoulder ER 2x 15 c towel under arm  Discussed pain adapting response to exercise with any rep or weight changes.    Neuro Re-ed (muscle activation, postural control, scapular control) Supine Lt shoulder in 90 deg flexion with mild resistance stabilizations 20-25 second bouts  Standing green band rows with scapular retraction focus 2 x 15 Standing green band GH ext bilaterally, 2 x 15  Standing Lt shoulder ER with towel under arm reactive walk out green band 5 sec hold x 10   Manual: Supine Lt shoulder inferior g2 GH joint glides, Posterior glide with passive ER mobilization with movement.    TODAY'S TREATMENT:                                                                                                        DATE: 07/10/2024 Therapeutic Exercise: Standing pendulums x 1 min each: A/P, lateral, Circles CCW/CW Lt bicep curls 2x10; 1# Seated Upper trap stretch x 4 holding 10 sec Therapeutic Activities: Seated UE ranger forward reaching 2 x 10 Seated UE ranger scaption reaching 2 x 10  Seated UE ranger circles both directions x 20  Seated scapular retraction x 10 holding 5 sec Sit to supine and supine to sit, c mild pain in left shoulder noted, pt able to perform independent with increased time to control pain in left shoulder Manual Lt shoulder A/P, inferior glides g2 for pain relief, PROM range with distraction Estim IFC to Lt shoulder with 4 electrodes crossing GH intensity to pt's tolreance x 15 mins. Performed with moist heat on Lt shoulder as well.    TODAY'S TREATMENT:  DATE: 07/06/2024 Therapeutic Exercise: Following Nustep pt performed Pendulum exercises flexion/extension, abduction/adduction and circles CW/CCW.  PT tactile cues for range.  Patient did report decrease in pain following pendulum exercises. Active internal and external rotation with pronation /  supination supine with elbow extended 15 reps Supine bicep curls with bicep stretch.   Therapeutic Activities: Posture set prior to activity Cervical retraction seated 10 reps with tactile cues. Posture set prior to activity Scapular retraction 10 reps with tactile cues to limit GH ext. Nustep with BUEs & BLEs seat 7 level 4 for 8 min with focus on full motion to facilitate arm motion. Reports left shoulder pain 8/10 at beginning and 7/10 at end.    Manual Lt shoulder inferior glides g2 for pain relief, PROM range with distraction  Estim IFC to Lt shoulder with 4 electrodes crossing GH to tolerance at 18 for 10 mins. Performed with moist  heat on Lt shoulder as well.    PATIENT EDUCATION: 07/21/2024 Education details: HEP update Person educated: Patient Education method: Programmer, Multimedia, Demonstration, Verbal cues, and Handouts Education comprehension: verbalized understanding, returned demonstration, and verbal cues required  HOME EXERCISE PROGRAM: Access Code: [email or web address] URL: [email or web address] Date: 07/21/2024 Prepared by: Ozell Silvan  Exercises - Seated Scapular Retraction  - 3-5 x daily - 7 x weekly - 1 sets - 10 reps - 3-5 hold - Seated Isometric Shoulder External Rotation with Towel (Mirrored)  - 1-2 x daily - 7 x weekly - 1 sets - 10 reps - 5 hold - Standing Isometric Shoulder Flexion with Doorway - Arm Bent (Mirrored)  - 1-2 x daily - 7 x weekly - 1 sets - 10 reps - 5 hold - Circular Shoulder Pendulum with Table Support  - 2 x daily - 7 x weekly - 1 sets - 10 reps - Supine Shoulder Flexion Extension Full Range AROM (Mirrored)  - 1-2 x daily - 7 x weekly - 1-2 sets - 10-20 reps - Sidelying Shoulder Abduction Palm Forward (Mirrored)  - 1-2 x daily - 7 x weekly - 2-3 sets - 10-15 reps - Sidelying Shoulder External Rotation  - 1-2 x daily - 7 x weekly - 2-3 sets - 10-15 reps  ASSESSMENT:  CLINICAL IMPRESSION: Fatigue and some soreness complaints noted with new interventions.  Not unreasonable given strength deficits observed.  Focused on lying down based activity to reduce gravity impacts as able, no resistance other than arm weight.   Fatigue still present but some improvement in quality of movement.   OBJECTIVE IMPAIRMENTS: decreased activity tolerance, decreased balance, decreased coordination, decreased endurance, decreased mobility, decreased ROM, decreased strength, hypomobility, increased fascial restrictions, impaired perceived functional ability, increased muscle spasms, impaired flexibility, impaired UE functional use, improper body mechanics, postural dysfunction, and pain.   ACTIVITY  LIMITATIONS: carrying, lifting, sleeping, bed mobility, bathing, toileting, dressing, self feeding, reach over head, and hygiene/grooming  PARTICIPATION LIMITATIONS: meal prep, cleaning, laundry, interpersonal relationship, driving, shopping, and community activity  PERSONAL FACTORS: Medical history: arthritis, chronic back pain(stenosis), GERD, hyperlipidemia, HTN, OA, history of Lt knee troubles.  are also affecting patient's functional outcome.   REHAB POTENTIAL: Fair to good  CLINICAL DECISION MAKING: Evolving/moderate complexity  EVALUATION COMPLEXITY: Moderate   GOALS: Goals reviewed with patient? Yes  SHORT TERM GOALS: (target date for Short term goals are 3 weeks 07/14/2024)  1.Patient will demonstrate independent use of home exercise program to maintain progress from in clinic treatments. Goal status: MET 07/10/24  LONG TERM GOALS: (target dates for all long term  goals are 10 weeks  09/01/2024 )   1. Patient will demonstrate/report pain at worst less than or equal to 2/10 to facilitate minimal limitation in daily activity secondary to pain symptoms. Goal status: Ongoing  07/10/2024   2. Patient will demonstrate independent use of home exercise program to facilitate ability to maintain/progress functional gains from skilled physical therapy services. Goal status: Ongoing  07/10/2024   3. Patient will demonstrate Patient specific functional scale avg > or = 7/10 to indicate reduced disability due to condition.  Goal status: Ongoing  07/10/2024 4.33 today   4.  Patient will demonstrate Lt  UE MMT 4/5 or greater throughout to facilitate lifting, reaching, carrying at [facility] in daily activity.   Goal status: Ongoing  07/04/2024   5.  Patient will demonstrate Lt  [facility] joint AROM WFL s symptoms to facilitate usual overhead reaching, self care, dressing at PLOF.    Goal status: Ongoing  06/30/2024   6.  Patient will demonstrate/report ability to sleep s restriction due to  symptoms.  Goal status: Ongoing  07/10/2024     PLAN:  PT FREQUENCY: 1-2x/week  PT DURATION: 10 weeks  PLANNED INTERVENTIONS: Can include 02853- PT Re-evaluation, 97110-Therapeutic exercises, 97530- Therapeutic activity, 97112- Neuromuscular re-education, 97535- Self Care, 97140- Manual therapy, [phone]- Gait training, [phone]- Orthotic Fit/training, [phone]- Canalith repositioning, [id number]- Aquatic Therapy, [phone]- Electrical stimulation (unattended), [id number] Physical performance testing, 97016- Vasopneumatic device, [id number]- Ultrasound, [id number]- Traction (mechanical), [id number]- Ionotophoresis 4mg /ml Dexamethasone ,  [id number] - Needle insertion w/o injection 1 or 2 muscles, 20561 - Needle insertion w/o injection 3 or more muscles.   Patient/Family education, Balance training, Stair training, Taping, Dry Needling, Joint mobilization, Joint manipulation, Spinal manipulation, Spinal mobilization, Scar mobilization, Vestibular training, Visual/preceptual remediation/compensation, DME instructions, Cryotherapy, and Moist heat.  All performed as medically necessary.  All included unless contraindicated  PLAN FOR NEXT SESSION:   AROM gains with early light resistance as able.    Ozell Silvan, PT, DPT, OCS, ATC 07/24/2024  9:20 AM   "

## 2024-07-26 ENCOUNTER — Encounter: Payer: Self-pay | Admitting: Rehabilitative and Restorative Service Providers"

## 2024-07-26 ENCOUNTER — Ambulatory Visit: Admitting: Rehabilitative and Restorative Service Providers"

## 2024-07-26 DIAGNOSIS — R293 Abnormal posture: Secondary | ICD-10-CM | POA: Diagnosis not present

## 2024-07-26 DIAGNOSIS — G8929 Other chronic pain: Secondary | ICD-10-CM

## 2024-07-26 DIAGNOSIS — M6281 Muscle weakness (generalized): Secondary | ICD-10-CM

## 2024-07-26 DIAGNOSIS — M25512 Pain in left shoulder: Secondary | ICD-10-CM | POA: Diagnosis not present

## 2024-07-26 NOTE — Therapy (Signed)
 " OUTPATIENT PHYSICAL THERAPY TREATMENT    Patient Name: Melissa Maldonado MRN: [id number] DOB:Jun 27, 1941, 84 y.o., female Today's Date: 07/26/2024  END OF SESSION:  PT End of Session - 07/26/24 0837     Visit Number 7    Number of Visits 20    Date for Recertification  09/01/24    Authorization Type Medicare    Progress Note Due on Visit 14    PT Start Time 0840    PT Stop Time 0919    PT Time Calculation (min) 39 min    Activity Tolerance Patient tolerated treatment well    Behavior During Therapy [facility] for tasks assessed/performed             Past Medical History:  Diagnosis Date   Arthritis    Chronic back pain    lumbar stenosis   Dysrhythmia    Early cataracts, bilateral    GERD (gastroesophageal reflux disease)    occasionally but no meds required   History of blood transfusion    History of colon polyps    Hyperlipidemia    takes Lipitor every other day   Hypertension    takes losartan  daily   Neuropathy    Osteoarthritis of left knee 04/27/2014   Restless leg    Shortness of breath    with exertion   SOB (shortness of breath) 2013   CPET   Tear of medial meniscus of left knee 09/08/2013   Past Surgical History:  Procedure Laterality Date   APPENDECTOMY     BACK SURGERY  07/20/2008   lumb fusion   CARDIOVERSION N/A 01/29/2023   Procedure: CARDIOVERSION;  Surgeon: Alvan Ronal BRAVO, MD;  Location: [facility];  Service: Cardiovascular;  Laterality: N/A;   COLONOSCOPY  2018   EYE SURGERY     both cataracts   KNEE ARTHROSCOPY WITH MEDIAL MENISECTOMY Left 09/08/2013   Procedure: LEFT KNEE ARTHROSCOPY WITH PARTIAL MEDIAL MENISCECTOMY;  Surgeon: Fonda SHAUNNA Olmsted, MD;  Location: [facility];  Service: Orthopedics;  Laterality: Left;   LUMBAR LAMINECTOMY/DECOMPRESSION MICRODISCECTOMY  05/13/2012   Procedure: LUMBAR LAMINECTOMY/DECOMPRESSION MICRODISCECTOMY 1 LEVEL;  Surgeon: Victory Gens, MD;  Location: [facility];  Service: Neurosurgery;   Laterality: Bilateral;  Bilateral Lumbar one -two Decompressive Laminectomy   PARTIAL KNEE ARTHROPLASTY Left 04/27/2014   Procedure: LEFT UNICOMPARTMENTAL KNEE;  Surgeon: Fonda SHAUNNA Olmsted, MD;  Location: [facility];  Service: Orthopedics;  Laterality: Left;   TOTAL KNEE ARTHROPLASTY Right 11/06/2022   Procedure: RIGHT TOTAL KNEE ARTHROPLASTY;  Surgeon: Vernetta Lonni GRADE, MD;  Location: [facility];  Service: Orthopedics;  Laterality: Right;   TUBAL LIGATION  1974   UPPER GI ENDOSCOPY     Patient Active Problem List   Diagnosis Date Noted   Persistent atrial fibrillation (HCC) 01/25/2023   Hypercoagulable state due to persistent atrial fibrillation (HCC) 01/25/2023   Status post total right knee replacement 11/06/2022   Arthritis of right acromioclavicular joint 08/28/2021   Impingement syndrome of right shoulder 08/19/2021   Nontraumatic incomplete tear of right rotator cuff 08/19/2021   Paresthesia 12/05/2020   Low back pain with right-sided sciatica 12/05/2020   Trochanteric bursitis, right hip 04/24/2020   Essential hypertension 01/12/2020   Sinus bradycardia 01/12/2020   Dyspnea on exertion 01/12/2020   Lumbar stenosis with neurogenic claudication 02/01/2017   Osteoarthritis of left knee 04/27/2014    PCP: Clarice Nottingham MD  REFERRING PROVIDER: Burnetta Brunet, DO  REFERRING DIAG: [id number] (ICD-10-CM) - Chronic  left shoulder pain M19.012 (ICD-10-CM) - Primary osteoarthritis, left shoulder M75.102,M12.812 (ICD-10-CM) - Rotator cuff tear arthropathy of left shoulder  THERAPY DIAG:  Chronic left shoulder pain  Muscle weakness (generalized)  Abnormal posture  Rationale for Evaluation and Treatment: Rehabilitation  ONSET DATE: Oct or Nov 2025.   SUBJECTIVE:                                                                                                                                                                                      SUBJECTIVE  STATEMENT: Pt indicated no pain at rest.  Reported soreness getting a little less.  Some improvement in ability to do the exercises.   Rt hand dominant.   PERTINENT HISTORY: Medical history: arthritis, chronic back pain(stenosis), GERD, hyperlipidemia, HTN, OA, history of Lt knee troubles.   PAIN:  NPRS scale:  soreness 4/10 Pain location: Lt shoulder, anterior/lateral shoulder  Pain description: sore, constant pain Aggravating factors: reaching, lifting, getting dressed.  Relieving factors: topical cream helps short term  PRECAUTIONS: None  WEIGHT BEARING RESTRICTIONS: No  FALLS:  Has patient fallen in last 6 months? No  LIVING ENVIRONMENT: Lives in: House/apartment  OCCUPATION: Retired  PLOF: Independent, go to gym.   PATIENT GOALS: Reduce pain,    OBJECTIVE:   DIAGNOSTIC FINDINGS:  05/30/2024 xray:   X-rays of the shoulder show significant degenerative spurring of the  glenohumeral joint with significant joint space narrowing.   06/27/24:  IMPRESSION: 1. Severe tendinosis of the supraspinatus tendon with a shallow partial-thickness bursal surface tear towards the anterior insertion. 2. Severe infraspinatus tendinosis with a interstitial tear measuring 16 mm medial-lateral and 7 mm AP towards the anterior aspect. 3. Severe subscapularis tendinosis with a small partial-thickness articular surface tear. 4. Moderate tendinosis of the intra-articular portion of the long head of the biceps tendon. 5. Severe subacromial/subdeltoid bursitis. 6. Moderate-severe osteoarthritis of the glenohumeral joint.  PATIENT SURVEYS:  Patient-Specific Activity Scoring Scheme  0 represents unable to perform. 10 represents able to perform at prior level. 0 1 2 3 4 5 6 7 8 9  10 (Date and Score)   Activity Eval  06/23/2024 07/10/24   1. sleep  4 6   2. Getting dressing   2  7  3. Reaching, lifting household 0 0  Score 2 avg 4.33   Total score = sum of the activity  scores/number of activities Minimum detectable change (90%CI) for average score = 2 points Minimum detectable change (90%CI) for single activity score = 3 points  COGNITION: 06/23/2024 Overall cognitive status: WFL     SENSATION: 06/23/2024 WFL  POSTURE: 06/23/2024 Rounded shoulders, FHP.   UPPER EXTREMITY ROM:  ROM Left Eval 06/23/2024 Left 07/06/24 07/10/24 Left Left 07/21/2024  Shoulder flexion < 20 deg with pain against gravity in supine  120 PROM in supine with pain Supine P: 144* A: 30* limited by pain & weakness Supine P: 108 110 AROM against gravity in sitting.   132 AROM in supine  Shoulder extension      Shoulder abduction      Shoulder adduction      Shoulder internal rotation 90 deg PROM in supine 45 deg abduction Supine P: 55*  85 AROM in 45 deg abduction supine  Shoulder external rotation 10 deg PROM with pain, in supine 45 deg abduction Supine P: 66* Supine A: 50 P: 55 70 AROM in 45 deg abduction supine  Elbow flexion      Elbow extension      Wrist flexion      Wrist extension      Wrist ulnar deviation      Wrist radial deviation      Wrist pronation      Wrist supination      (Blank rows = not tested)  UPPER EXTREMITY MMT:  MMT Right Eval 06/23/2024 Left Eval 06/23/2024 Left 07/21/2024  Shoulder flexion 5/5 1+/5 2+/5  Shoulder extension     Shoulder abduction 5/5 1+/5   Shoulder adduction     Shoulder internal rotation 5/5 4/5   Shoulder external rotation 5/5 1+/5 3/5  Middle trapezius     Lower trapezius     Elbow flexion 5/5    Elbow extension 5/5    Wrist flexion     Wrist extension     Wrist ulnar deviation     Wrist radial deviation     Wrist pronation     Wrist supination     Grip strength (lbs)     (Blank rows = not tested)  SHOULDER SPECIAL TESTS: 06/23/2024 (+) Painful arc, drop arm, ER lag testing Lt arm  JOINT MOBILITY TESTING:  07/21/2024: Normal mobility in mid ranges, less muscle guarding to passive movement  assessment compared to eval.   06/23/2024 No limits noted in inferior and posterior glides Lt shoulder.   PALPATION:  07/26/2024: Trigger points noted in Lt upper trap.   06/23/2024 Tenderness to light touch around Lt shoulder joint.                                                                                                                                                                                                   TODAY'S TREATMENT:  DATE:  07/26/2024 Therex: Supine Lt shoulder flexion 1 lb weight 2 x 10  Sidelying Lt shoulder abduction 2 x 15 Sidelying Lt shoulder ER with towel under arm 1 lb 2 x 10   Neuro Re-ed (muscle activation, postural control, scapular control) Supine Lt shoulder 90 deg flexion small circles cw, ccw x 20 each with 1 lb weight.  Visual cues from clinician for circle size.  Supine SA press 2 lb weight 3 sec hold x 10 Lt arm   TherActivity (to improve functional reach, lifting) Pulley with focus on eccentric lowering control Lt arm in flexion 3 mins UE ranger assisted elevation into flexion, scaption x 15 each way Standing Rt arm assisting Lt arm into full flexion with outstretched arm, eccentric lowering x 10   Manual  Percussive device to Lt upper trap, levator for pain  TODAY'S TREATMENT:                                                                                                       DATE:  07/24/2024 Therex: UBE fwd/back 3 mins each way lvl 1.0 Supine Lt shoulder AROM flexion 2 x 15  Supine 2 lb wand flexion AAROM stretch 2-3 sec hold 2 x 10  Sidelying Lt shoulder abduction 2 x 10 Sidelying Lt shoulder ER with towel under arm 2 x 15   Neuro Re-ed (muscle activation, postural control, scapular control) Standing green band rows bilateral 2 x 15 with scapular retraction focus Standing green band gh ext 2 x 15 bilaterally  Supine SA press in 90  deg flexion with tactile cues 3 sec hold x 10 bilaterally performed.    TODAY'S TREATMENT:                                                                                                       DATE:  07/21/2024 Therex: Supine Lt shoulder AROM x 20 (added to home program), x 10 with 1 lb weight Sidelying Lt shoulder AROM 2 x 7 Sidelying Lt shoulder ER 2x 15 c towel under arm  Discussed pain adapting response to exercise with any rep or weight changes.    Neuro Re-ed (muscle activation, postural control, scapular control) Supine Lt shoulder in 90 deg flexion with mild resistance stabilizations 20-25 second bouts  Standing green band rows with scapular retraction focus 2 x 15 Standing green band GH ext bilaterally, 2 x 15  Standing Lt shoulder ER with towel under arm reactive walk out green band 5 sec hold x 10   Manual: Supine Lt shoulder inferior g2 GH joint glides, Posterior glide with passive ER mobilization with movement.    TODAY'S TREATMENT:  DATE: 07/10/2024 Therapeutic Exercise: Standing pendulums x 1 min each: A/P, lateral, Circles CCW/CW Lt bicep curls 2x10; 1# Seated Upper trap stretch x 4 holding 10 sec Therapeutic Activities: Seated UE ranger forward reaching 2 x 10 Seated UE ranger scaption reaching 2 x 10  Seated UE ranger circles both directions x 20  Seated scapular retraction x 10 holding 5 sec Sit to supine and supine to sit, c mild pain in left shoulder noted, pt able to perform independent with increased time to control pain in left shoulder Manual Lt shoulder A/P, inferior glides g2 for pain relief, PROM range with distraction Estim IFC to Lt shoulder with 4 electrodes crossing GH intensity to pt's tolreance x 15 mins. Performed with moist heat on Lt shoulder as well.  PATIENT EDUCATION: 07/21/2024 Education details: HEP update Person educated:  Patient Education method: Programmer, Multimedia, Demonstration, Verbal cues, and Handouts Education comprehension: verbalized understanding, returned demonstration, and verbal cues required  HOME EXERCISE PROGRAM: Access Code: [email or web address] URL: [email or web address] Date: 07/21/2024 Prepared by: Ozell Silvan  Exercises - Seated Scapular Retraction  - 3-5 x daily - 7 x weekly - 1 sets - 10 reps - 3-5 hold - Seated Isometric Shoulder External Rotation with Towel (Mirrored)  - 1-2 x daily - 7 x weekly - 1 sets - 10 reps - 5 hold - Standing Isometric Shoulder Flexion with Doorway - Arm Bent (Mirrored)  - 1-2 x daily - 7 x weekly - 1 sets - 10 reps - 5 hold - Circular Shoulder Pendulum with Table Support  - 2 x daily - 7 x weekly - 1 sets - 10 reps - Supine Shoulder Flexion Extension Full Range AROM (Mirrored)  - 1-2 x daily - 7 x weekly - 1-2 sets - 10-20 reps - Sidelying Shoulder Abduction Palm Forward (Mirrored)  - 1-2 x daily - 7 x weekly - 2-3 sets - 10-15 reps - Sidelying Shoulder External Rotation  - 1-2 x daily - 7 x weekly - 2-3 sets - 10-15 reps  ASSESSMENT:  CLINICAL IMPRESSION: Able to make progression into good control eccentric lowering flexion Lt arm with outstretched arm.  Pt continued to show improvement in quality and tolerance in gravity reduced active range challenges.  Making progressions towards improve elevation against gravity.  Continued skilled PT services indicated at this time.   OBJECTIVE IMPAIRMENTS: decreased activity tolerance, decreased balance, decreased coordination, decreased endurance, decreased mobility, decreased ROM, decreased strength, hypomobility, increased fascial restrictions, impaired perceived functional ability, increased muscle spasms, impaired flexibility, impaired UE functional use, improper body mechanics, postural dysfunction, and pain.   ACTIVITY LIMITATIONS: carrying, lifting, sleeping, bed mobility, bathing, toileting, dressing, self  feeding, reach over head, and hygiene/grooming  PARTICIPATION LIMITATIONS: meal prep, cleaning, laundry, interpersonal relationship, driving, shopping, and community activity  PERSONAL FACTORS: Medical history: arthritis, chronic back pain(stenosis), GERD, hyperlipidemia, HTN, OA, history of Lt knee troubles.  are also affecting patient's functional outcome.   REHAB POTENTIAL: Fair to good  CLINICAL DECISION MAKING: Evolving/moderate complexity  EVALUATION COMPLEXITY: Moderate   GOALS: Goals reviewed with patient? Yes  SHORT TERM GOALS: (target date for Short term goals are 3 weeks 07/14/2024)  1.Patient will demonstrate independent use of home exercise program to maintain progress from in clinic treatments. Goal status: MET 07/10/24  LONG TERM GOALS: (target dates for all long term goals are 10 weeks  09/01/2024 )   1. Patient will demonstrate/report pain at worst less than or equal to 2/10 to facilitate minimal limitation in daily activity secondary  to pain symptoms. Goal status: Ongoing  07/10/2024   2. Patient will demonstrate independent use of home exercise program to facilitate ability to maintain/progress functional gains from skilled physical therapy services. Goal status: Ongoing  07/10/2024   3. Patient will demonstrate Patient specific functional scale avg > or = 7/10 to indicate reduced disability due to condition.  Goal status: Ongoing  07/10/2024 4.33 today   4.  Patient will demonstrate Lt  UE MMT 4/5 or greater throughout to facilitate lifting, reaching, carrying at [facility] in daily activity.   Goal status: Ongoing  07/04/2024   5.  Patient will demonstrate Lt  [facility] joint AROM WFL s symptoms to facilitate usual overhead reaching, self care, dressing at PLOF.    Goal status: Ongoing  06/30/2024   6.  Patient will demonstrate/report ability to sleep s restriction due to symptoms.  Goal status: Ongoing  07/10/2024     PLAN:  PT FREQUENCY: 1-2x/week  PT  DURATION: 10 weeks  PLANNED INTERVENTIONS: Can include 02853- PT Re-evaluation, 97110-Therapeutic exercises, 97530- Therapeutic activity, 97112- Neuromuscular re-education, 97535- Self Care, 97140- Manual therapy, [phone]- Gait training, [phone]- Orthotic Fit/training, [phone]- Canalith repositioning, [id number]- Aquatic Therapy, [phone]- Electrical stimulation (unattended), [id number] Physical performance testing, 97016- Vasopneumatic device, [id number]- Ultrasound, [id number]- Traction (mechanical), [id number]- Ionotophoresis 4mg /ml Dexamethasone ,  [id number] - Needle insertion w/o injection 1 or 2 muscles, 20561 - Needle insertion w/o injection 3 or more muscles.   Patient/Family education, Balance training, Stair training, Taping, Dry Needling, Joint mobilization, Joint manipulation, Spinal manipulation, Spinal mobilization, Scar mobilization, Vestibular training, Visual/preceptual remediation/compensation, DME instructions, Cryotherapy, and Moist heat.  All performed as medically necessary.  All included unless contraindicated  PLAN FOR NEXT SESSION:   Use of eccentric lowering training to avoid shrug against gravity.     Ozell Silvan, PT, DPT, OCS, ATC 07/26/2024  9:20 AM   "

## 2024-07-31 ENCOUNTER — Ambulatory Visit: Admitting: Physical Therapy

## 2024-07-31 ENCOUNTER — Encounter: Payer: Self-pay | Admitting: Physical Therapy

## 2024-07-31 DIAGNOSIS — M6281 Muscle weakness (generalized): Secondary | ICD-10-CM | POA: Diagnosis not present

## 2024-07-31 DIAGNOSIS — R293 Abnormal posture: Secondary | ICD-10-CM

## 2024-07-31 DIAGNOSIS — G8929 Other chronic pain: Secondary | ICD-10-CM

## 2024-07-31 DIAGNOSIS — M25512 Pain in left shoulder: Secondary | ICD-10-CM | POA: Diagnosis not present

## 2024-07-31 NOTE — Therapy (Signed)
 " OUTPATIENT PHYSICAL THERAPY TREATMENT    Patient Name: Melissa Maldonado MRN: [id number] DOB:[birth year], 84 y.o., female Today's Date: 07/31/2024  END OF SESSION:  PT End of Session - 07/31/24 0859     Visit Number 8    Number of Visits 20    Date for Recertification  09/01/24    Authorization Type Medicare    Authorization Time Period PN sent at visit 4    Progress Note Due on Visit 14    PT Start Time 0851    PT Stop Time 0930    PT Time Calculation (min) 39 min    Activity Tolerance Patient tolerated treatment well    Behavior During Therapy [facility] for tasks assessed/performed              Past Medical History:  Diagnosis Date   Arthritis    Chronic back pain    lumbar stenosis   Dysrhythmia    Early cataracts, bilateral    GERD (gastroesophageal reflux disease)    occasionally but no meds required   History of blood transfusion    History of colon polyps    Hyperlipidemia    takes Lipitor every other day   Hypertension    takes losartan  daily   Neuropathy    Osteoarthritis of left knee 04/27/2014   Restless leg    Shortness of breath    with exertion   SOB (shortness of breath) 2013   CPET   Tear of medial meniscus of left knee 09/08/2013   Past Surgical History:  Procedure Laterality Date   APPENDECTOMY     BACK SURGERY  07/20/2008   lumb fusion   CARDIOVERSION N/A 01/29/2023   Procedure: CARDIOVERSION;  Surgeon: Alvan Ronal BRAVO, MD;  Location: [facility];  Service: Cardiovascular;  Laterality: N/A;   COLONOSCOPY  2018   EYE SURGERY     both cataracts   KNEE ARTHROSCOPY WITH MEDIAL MENISECTOMY Left 09/08/2013   Procedure: LEFT KNEE ARTHROSCOPY WITH PARTIAL MEDIAL MENISCECTOMY;  Surgeon: Fonda SHAUNNA Olmsted, MD;  Location: [facility];  Service: Orthopedics;  Laterality: Left;   LUMBAR LAMINECTOMY/DECOMPRESSION MICRODISCECTOMY  05/13/2012   Procedure: LUMBAR LAMINECTOMY/DECOMPRESSION MICRODISCECTOMY 1 LEVEL;  Surgeon: Victory Gens, MD;   Location: [facility];  Service: Neurosurgery;  Laterality: Bilateral;  Bilateral Lumbar one -two Decompressive Laminectomy   PARTIAL KNEE ARTHROPLASTY Left 04/27/2014   Procedure: LEFT UNICOMPARTMENTAL KNEE;  Surgeon: Fonda SHAUNNA Olmsted, MD;  Location: [facility];  Service: Orthopedics;  Laterality: Left;   TOTAL KNEE ARTHROPLASTY Right 11/06/2022   Procedure: RIGHT TOTAL KNEE ARTHROPLASTY;  Surgeon: Vernetta Lonni GRADE, MD;  Location: [facility];  Service: Orthopedics;  Laterality: Right;   TUBAL LIGATION  1974   UPPER GI ENDOSCOPY     Patient Active Problem List   Diagnosis Date Noted   Persistent atrial fibrillation (HCC) 01/25/2023   Hypercoagulable state due to persistent atrial fibrillation (HCC) 01/25/2023   Status post total right knee replacement 11/06/2022   Arthritis of right acromioclavicular joint 08/28/2021   Impingement syndrome of right shoulder 08/19/2021   Nontraumatic incomplete tear of right rotator cuff 08/19/2021   Paresthesia 12/05/2020   Low back pain with right-sided sciatica 12/05/2020   Trochanteric bursitis, right hip 04/24/2020   Essential hypertension 01/12/2020   Sinus bradycardia 01/12/2020   Dyspnea on exertion 01/12/2020   Lumbar stenosis with neurogenic claudication 02/01/2017   Osteoarthritis of left knee 04/27/2014    PCP: Clarice Nottingham MD  REFERRING PROVIDER: Burnetta Brunet, DO  REFERRING DIAG: [phone] (ICD-10-CM) - Chronic left shoulder pain M19.012 (ICD-10-CM) - Primary osteoarthritis, left shoulder M75.102,M12.812 (ICD-10-CM) - Rotator cuff tear arthropathy of left shoulder  THERAPY DIAG:  Chronic left shoulder pain  Muscle weakness (generalized)  Abnormal posture  Rationale for Evaluation and Treatment: Rehabilitation  ONSET DATE: Oct or Nov 2025.   SUBJECTIVE:                                                                                                                                                                                       SUBJECTIVE STATEMENT: Pt reporting increased pain following her last PT visit in her upper cervical and left shoulder. Pt feels the percussion device was too much and caused excess pain later that evening into the following day.   Rt hand dominant.   PERTINENT HISTORY: Medical history: arthritis, chronic back pain(stenosis), GERD, hyperlipidemia, HTN, OA, history of Lt knee troubles.   PAIN:  NPRS scale:  soreness 4/ 10 Pain location: Lt shoulder, anterior/lateral shoulder  Pain description: sore, constant pain Aggravating factors: reaching, lifting, getting dressed.  Relieving factors: topical cream helps short term  PRECAUTIONS: None  WEIGHT BEARING RESTRICTIONS: No  FALLS:  Has patient fallen in last 6 months? No  LIVING ENVIRONMENT: Lives in: House/apartment  OCCUPATION: Retired  PLOF: Independent, go to gym.   PATIENT GOALS: Reduce pain,    OBJECTIVE:   DIAGNOSTIC FINDINGS:  05/30/2024 xray:   X-rays of the shoulder show significant degenerative spurring of the  glenohumeral joint with significant joint space narrowing.   06/27/24:  IMPRESSION: 1. Severe tendinosis of the supraspinatus tendon with a shallow partial-thickness bursal surface tear towards the anterior insertion. 2. Severe infraspinatus tendinosis with a interstitial tear measuring 16 mm medial-lateral and 7 mm AP towards the anterior aspect. 3. Severe subscapularis tendinosis with a small partial-thickness articular surface tear. 4. Moderate tendinosis of the intra-articular portion of the long head of the biceps tendon. 5. Severe subacromial/subdeltoid bursitis. 6. Moderate-severe osteoarthritis of the glenohumeral joint.  PATIENT SURVEYS:  Patient-Specific Activity Scoring Scheme  0 represents unable to perform. 10 represents able to perform at prior level. 0 1 2 3 4 5 6 7 8 9  10 (Date and Score)   Activity Eval  06/23/2024 07/10/24   1. sleep  4  6   2. Getting dressing   2  7  3. Reaching, lifting household 0 0  Score 2 avg 4.33   Total score = sum of the activity scores/number of activities Minimum detectable change (90%CI) for average score = 2 points Minimum detectable change (90%CI) for single activity score = 3 points  COGNITION: 06/23/2024 Overall cognitive status: WFL     SENSATION: 06/23/2024 WFL  POSTURE: 06/23/2024 Rounded shoulders, FHP.   UPPER EXTREMITY ROM:   ROM Left Eval 06/23/2024 Left 07/06/24 07/10/24 Left Left 07/21/2024 Left 07/31/24  Shoulder flexion < 20 deg with pain against gravity in supine  120 PROM in supine with pain Supine P: 144* A: 30* limited by pain & weakness Supine P: 108 110 AROM against gravity in sitting.   132 AROM in supine Supine AA: 135  Sitting:  A: 132   Shoulder extension       Shoulder abduction       Shoulder adduction       Shoulder internal rotation 90 deg PROM in supine 45 deg abduction Supine P: 55*  85 AROM in 45 deg abduction supine   Shoulder external rotation 10 deg PROM with pain, in supine 45 deg abduction Supine P: 66* Supine A: 50 P: 55 70 AROM in 45 deg abduction supine Supine A: 60 c mild pain, shoulder abd 45 deg  Elbow flexion       Elbow extension       Wrist flexion       Wrist extension       Wrist ulnar deviation       Wrist radial deviation       Wrist pronation       Wrist supination       (Blank rows = not tested)  UPPER EXTREMITY MMT:  MMT Right Eval 06/23/2024 Left Eval 06/23/2024 Left 07/21/2024  Shoulder flexion 5/5 1+/5 2+/5  Shoulder extension     Shoulder abduction 5/5 1+/5   Shoulder adduction     Shoulder internal rotation 5/5 4/5   Shoulder external rotation 5/5 1+/5 3/5  Middle trapezius     Lower trapezius     Elbow flexion 5/5    Elbow extension 5/5    Wrist flexion     Wrist extension     Wrist ulnar deviation     Wrist radial deviation     Wrist pronation     Wrist supination     Grip strength  (lbs)     (Blank rows = not tested)  SHOULDER SPECIAL TESTS: 06/23/2024 (+) Painful arc, drop arm, ER lag testing Lt arm  JOINT MOBILITY TESTING:  07/21/2024: Normal mobility in mid ranges, less muscle guarding to passive movement assessment compared to eval.   06/23/2024 No limits noted in inferior and posterior glides Lt shoulder.   PALPATION:  07/26/2024: Trigger points noted in Lt upper trap.   06/23/2024 Tenderness to light touch around Lt shoulder joint.  TODAY'S TREATMENT:                                                                                                       DATE:  07/31/2024 TherEx Side lying shoulder ER: 2 x 10 c 1 # weight Side lying shoulder Abd: 2 x 10 c 1 # weight Upper trap stretch x 2 bil holding 10 sec Neuro Re-Ed Supine shoulder flexion in mid range 45 deg to 100 deg c 2 # weight x 15 Supine shoulder protraction with circles performed both directions x 15 c 2 # weight TherAct Pulley with focus on eccentric lowering control Lt arm in flexion 3 mins Chest press c 3 # bar 2 x 10  Standing Rt arm assisting Lt arm into full flexion with outstretched arm, eccentric lowering x 10  UBE: x 2 minutes each direction Manaul Supine Lt shoulder inferior g2 GH joint glides, Posterior glides, PROM in all planes to pt's tolerance     TODAY'S TREATMENT:                                                                                                       DATE:  07/26/2024 Therex: Supine Lt shoulder flexion 1 lb weight 2 x 10  Sidelying Lt shoulder abduction 2 x 15 Sidelying Lt shoulder ER with towel under arm 1 lb 2 x 10   Neuro Re-ed (muscle activation, postural control, scapular control) Supine Lt shoulder 90 deg flexion small circles cw, ccw x 20 each with 1 lb weight.   Visual cues from clinician for circle size.  Supine SA press 2 lb weight 3 sec hold x 10 Lt arm   TherActivity (to improve functional reach, lifting) Pulley with focus on eccentric lowering control Lt arm in flexion 3 mins UE ranger assisted elevation into flexion, scaption x 15 each way Standing Rt arm assisting Lt arm into full flexion with outstretched arm, eccentric lowering x 10   Manual  Percussive device to Lt upper trap, levator for pain  TODAY'S TREATMENT:                                                                                                       DATE:  07/24/2024 Therex:  UBE fwd/back 3 mins each way lvl 1.0 Supine Lt shoulder AROM flexion 2 x 15  Supine 2 lb wand flexion AAROM stretch 2-3 sec hold 2 x 10  Sidelying Lt shoulder abduction 2 x 10 Sidelying Lt shoulder ER with towel under arm 2 x 15   Neuro Re-ed (muscle activation, postural control, scapular control) Standing green band rows bilateral 2 x 15 with scapular retraction focus Standing green band gh ext 2 x 15 bilaterally  Supine SA press in 90 deg flexion with tactile cues 3 sec hold x 10 bilaterally performed.    TODAY'S TREATMENT:                                                                                                       DATE:  07/21/2024 Therex: Supine Lt shoulder AROM x 20 (added to home program), x 10 with 1 lb weight Sidelying Lt shoulder AROM 2 x 7 Sidelying Lt shoulder ER 2x 15 c towel under arm  Discussed pain adapting response to exercise with any rep or weight changes.    Neuro Re-ed (muscle activation, postural control, scapular control) Supine Lt shoulder in 90 deg flexion with mild resistance stabilizations 20-25 second bouts  Standing green band rows with scapular retraction focus 2 x 15 Standing green band GH ext bilaterally, 2 x 15  Standing Lt shoulder ER with towel under arm reactive walk out green band 5 sec hold x 10   Manual: Supine Lt shoulder inferior g2 GH joint  glides, Posterior glide with passive ER mobilization with movement.      PATIENT EDUCATION: 07/21/2024 Education details: HEP update Person educated: Patient Education method: Programmer, Multimedia, Demonstration, Verbal cues, and Handouts Education comprehension: verbalized understanding, returned demonstration, and verbal cues required  HOME EXERCISE PROGRAM: Access Code: [email or web address] URL: [email or web address] Date: 07/21/2024 Prepared by: Ozell Silvan  Exercises - Seated Scapular Retraction  - 3-5 x daily - 7 x weekly - 1 sets - 10 reps - 3-5 hold - Seated Isometric Shoulder External Rotation with Towel (Mirrored)  - 1-2 x daily - 7 x weekly - 1 sets - 10 reps - 5 hold - Standing Isometric Shoulder Flexion with Doorway - Arm Bent (Mirrored)  - 1-2 x daily - 7 x weekly - 1 sets - 10 reps - 5 hold - Circular Shoulder Pendulum with Table Support  - 2 x daily - 7 x weekly - 1 sets - 10 reps - Supine Shoulder Flexion Extension Full Range AROM (Mirrored)  - 1-2 x daily - 7 x weekly - 1-2 sets - 10-20 reps - Sidelying Shoulder Abduction Palm Forward (Mirrored)  - 1-2 x daily - 7 x weekly - 2-3 sets - 10-15 reps - Sidelying Shoulder External Rotation  - 1-2 x daily - 7 x weekly - 2-3 sets - 10-15 reps  ASSESSMENT:  CLINICAL IMPRESSION: Pt still attempting to compensate using her upper trap and required verbal cues to correct. Pt issued upper trap Pt still working on active ROM and strengthening with skilled PT interventions to maximize pt's function.   OBJECTIVE IMPAIRMENTS:  decreased activity tolerance, decreased balance, decreased coordination, decreased endurance, decreased mobility, decreased ROM, decreased strength, hypomobility, increased fascial restrictions, impaired perceived functional ability, increased muscle spasms, impaired flexibility, impaired UE functional use, improper body mechanics, postural dysfunction, and pain.   ACTIVITY LIMITATIONS: carrying, lifting, sleeping,  bed mobility, bathing, toileting, dressing, self feeding, reach over head, and hygiene/grooming  PARTICIPATION LIMITATIONS: meal prep, cleaning, laundry, interpersonal relationship, driving, shopping, and community activity  PERSONAL FACTORS: Medical history: arthritis, chronic back pain(stenosis), GERD, hyperlipidemia, HTN, OA, history of Lt knee troubles.  are also affecting patient's functional outcome.   REHAB POTENTIAL: Fair to good  CLINICAL DECISION MAKING: Evolving/moderate complexity  EVALUATION COMPLEXITY: Moderate   GOALS: Goals reviewed with patient? Yes  SHORT TERM GOALS: (target date for Short term goals are 3 weeks 07/14/2024)  1.Patient will demonstrate independent use of home exercise program to maintain progress from in clinic treatments. Goal status: MET 07/10/24  LONG TERM GOALS: (target dates for all long term goals are 10 weeks  09/01/2024 )   1. Patient will demonstrate/report pain at worst less than or equal to 2/10 to facilitate minimal limitation in daily activity secondary to pain symptoms. Goal status: Ongoing  07/31/2024   2. Patient will demonstrate independent use of home exercise program to facilitate ability to maintain/progress functional gains from skilled physical therapy services. Goal status: Ongoing  07/10/2024   3. Patient will demonstrate Patient specific functional scale avg > or = 7/10 to indicate reduced disability due to condition.  Goal status: Ongoing  07/10/2024 4.33 today   4.  Patient will demonstrate Lt  UE MMT 4/5 or greater throughout to facilitate lifting, reaching, carrying at [facility] in daily activity.   Goal status: Ongoing  07/04/2024   5.  Patient will demonstrate Lt  [facility] joint AROM WFL s symptoms to facilitate usual overhead reaching, self care, dressing at PLOF.    Goal status: Ongoing  06/30/2024   6.  Patient will demonstrate/report ability to sleep s restriction due to symptoms.  Goal status: Ongoing  07/31/2024      PLAN:  PT FREQUENCY: 1-2x/week  PT DURATION: 10 weeks  PLANNED INTERVENTIONS: Can include 02853- PT Re-evaluation, 97110-Therapeutic exercises, 97530- Therapeutic activity, 97112- Neuromuscular re-education, 97535- Self Care, 97140- Manual therapy, [phone]- Gait training, [phone]- Orthotic Fit/training, [phone]- Canalith repositioning, [id number]- Aquatic Therapy, [phone]- Electrical stimulation (unattended), [id number] Physical performance testing, 97016- Vasopneumatic device, [id number]- Ultrasound, [id number]- Traction (mechanical), [id number]- Ionotophoresis 4mg /ml Dexamethasone ,  [id number] - Needle insertion w/o injection 1 or 2 muscles, [id number] - Needle insertion w/o injection 3 or more muscles.   Patient/Family education, Balance training, Stair training, Taping, Dry Needling, Joint mobilization, Joint manipulation, Spinal manipulation, Spinal mobilization, Scar mobilization, Vestibular training, Visual/preceptual remediation/compensation, DME instructions, Cryotherapy, and Moist heat.  All performed as medically necessary.  All included unless contraindicated  PLAN FOR NEXT SESSION:   Continue to progress c use of eccentric lowering training to avoid shrug against gravity.     Delon Lunger, PT, MPT 07/31/2024 9:24 AM   07/31/2024  9:24 AM   "

## 2024-08-02 ENCOUNTER — Encounter: Payer: Self-pay | Admitting: Physical Therapy

## 2024-08-02 ENCOUNTER — Ambulatory Visit (INDEPENDENT_AMBULATORY_CARE_PROVIDER_SITE_OTHER): Admitting: Physical Therapy

## 2024-08-02 DIAGNOSIS — R293 Abnormal posture: Secondary | ICD-10-CM

## 2024-08-02 DIAGNOSIS — G8929 Other chronic pain: Secondary | ICD-10-CM | POA: Diagnosis not present

## 2024-08-02 DIAGNOSIS — M6281 Muscle weakness (generalized): Secondary | ICD-10-CM | POA: Diagnosis not present

## 2024-08-02 DIAGNOSIS — M25512 Pain in left shoulder: Secondary | ICD-10-CM | POA: Diagnosis not present

## 2024-08-02 NOTE — Therapy (Signed)
 " OUTPATIENT PHYSICAL THERAPY TREATMENT    Patient Name: Melissa Maldonado MRN: [id number] DOB:[birth year], 84 y.o., female Today's Date: 08/02/2024  END OF SESSION:  PT End of Session - 08/02/24 1043     Visit Number 9    Number of Visits 20    Date for Recertification  09/01/24    Authorization Type Medicare    Authorization Time Period PN sent at visit 4    Progress Note Due on Visit 14    PT Start Time 1055    PT Stop Time 1140    PT Time Calculation (min) 45 min    Activity Tolerance Patient tolerated treatment well    Behavior During Therapy [facility] for tasks assessed/performed               Past Medical History:  Diagnosis Date   Arthritis    Chronic back pain    lumbar stenosis   Dysrhythmia    Early cataracts, bilateral    GERD (gastroesophageal reflux disease)    occasionally but no meds required   History of blood transfusion    History of colon polyps    Hyperlipidemia    takes Lipitor every other day   Hypertension    takes losartan  daily   Neuropathy    Osteoarthritis of left knee 04/27/2014   Restless leg    Shortness of breath    with exertion   SOB (shortness of breath) 2013   CPET   Tear of medial meniscus of left knee 09/08/2013   Past Surgical History:  Procedure Laterality Date   APPENDECTOMY     BACK SURGERY  07/20/2008   lumb fusion   CARDIOVERSION N/A 01/29/2023   Procedure: CARDIOVERSION;  Surgeon: Alvan Ronal BRAVO, MD;  Location: [facility];  Service: Cardiovascular;  Laterality: N/A;   COLONOSCOPY  2018   EYE SURGERY     both cataracts   KNEE ARTHROSCOPY WITH MEDIAL MENISECTOMY Left 09/08/2013   Procedure: LEFT KNEE ARTHROSCOPY WITH PARTIAL MEDIAL MENISCECTOMY;  Surgeon: Fonda SHAUNNA Olmsted, MD;  Location: [facility];  Service: Orthopedics;  Laterality: Left;   LUMBAR LAMINECTOMY/DECOMPRESSION MICRODISCECTOMY  05/13/2012   Procedure: LUMBAR LAMINECTOMY/DECOMPRESSION MICRODISCECTOMY 1 LEVEL;  Surgeon: Victory Gens,  MD;  Location: [facility];  Service: Neurosurgery;  Laterality: Bilateral;  Bilateral Lumbar one -two Decompressive Laminectomy   PARTIAL KNEE ARTHROPLASTY Left 04/27/2014   Procedure: LEFT UNICOMPARTMENTAL KNEE;  Surgeon: Fonda SHAUNNA Olmsted, MD;  Location: [facility];  Service: Orthopedics;  Laterality: Left;   TOTAL KNEE ARTHROPLASTY Right 11/06/2022   Procedure: RIGHT TOTAL KNEE ARTHROPLASTY;  Surgeon: Vernetta Lonni GRADE, MD;  Location: [facility];  Service: Orthopedics;  Laterality: Right;   TUBAL LIGATION  1974   UPPER GI ENDOSCOPY     Patient Active Problem List   Diagnosis Date Noted   Persistent atrial fibrillation (HCC) 01/25/2023   Hypercoagulable state due to persistent atrial fibrillation (HCC) 01/25/2023   Status post total right knee replacement 11/06/2022   Arthritis of right acromioclavicular joint 08/28/2021   Impingement syndrome of right shoulder 08/19/2021   Nontraumatic incomplete tear of right rotator cuff 08/19/2021   Paresthesia 12/05/2020   Low back pain with right-sided sciatica 12/05/2020   Trochanteric bursitis, right hip 04/24/2020   Essential hypertension 01/12/2020   Sinus bradycardia 01/12/2020   Dyspnea on exertion 01/12/2020   Lumbar stenosis with neurogenic claudication 02/01/2017   Osteoarthritis of left knee 04/27/2014    PCP: Clarice Nottingham MD  REFERRING PROVIDER: Burnetta Brunet, DO  REFERRING DIAG: [id number] (ICD-10-CM) - Chronic left shoulder pain M19.012 (ICD-10-CM) - Primary osteoarthritis, left shoulder M75.102,M12.812 (ICD-10-CM) - Rotator cuff tear arthropathy of left shoulder  THERAPY DIAG:  Chronic left shoulder pain  Muscle weakness (generalized)  Abnormal posture  Rationale for Evaluation and Treatment: Rehabilitation  ONSET DATE: Oct or Nov 2025.   SUBJECTIVE:                                                                                                                                                                                       SUBJECTIVE STATEMENT: His shoulder is slowly getting better. The weather with rain coming in is making shoulder a little sore.   Rt hand dominant.   PERTINENT HISTORY: Medical history: arthritis, chronic back pain(stenosis), GERD, hyperlipidemia, HTN, OA, history of Lt knee troubles.   PAIN:  NPRS scale:  since last PT appt lowest 2/10 and highest 4/ 10 (ache) Pain location: Lt shoulder, anterior/lateral shoulder, upper Trapezius Pain description: sore, constant pain Aggravating factors: reaching, lifting, getting dressed.  Relieving factors: topical cream helps short term  PRECAUTIONS: None  WEIGHT BEARING RESTRICTIONS: No  FALLS:  Has patient fallen in last 6 months? No  LIVING ENVIRONMENT: Lives in: House/apartment  OCCUPATION: Retired  PLOF: Independent, go to gym.   PATIENT GOALS: Reduce pain,    OBJECTIVE:   DIAGNOSTIC FINDINGS:  05/30/2024 xray:   X-rays of the shoulder show significant degenerative spurring of the  glenohumeral joint with significant joint space narrowing.   06/27/24:  IMPRESSION: 1. Severe tendinosis of the supraspinatus tendon with a shallow partial-thickness bursal surface tear towards the anterior insertion. 2. Severe infraspinatus tendinosis with a interstitial tear measuring 16 mm medial-lateral and 7 mm AP towards the anterior aspect. 3. Severe subscapularis tendinosis with a small partial-thickness articular surface tear. 4. Moderate tendinosis of the intra-articular portion of the long head of the biceps tendon. 5. Severe subacromial/subdeltoid bursitis. 6. Moderate-severe osteoarthritis of the glenohumeral joint.  PATIENT SURVEYS:  Patient-Specific Activity Scoring Scheme  0 represents unable to perform. 10 represents able to perform at prior level. 0 1 2 3 4 5 6 7 8 9  10 (Date and Score)   Activity Eval  06/23/24 07/10/24   1. sleep  4 6   2. Getting dressed  2  7  3.  Reaching, lifting household 0 0  Score 2 avg 4.33   Total score = sum of the activity scores/number of activities Minimum detectable change (90%CI) for average score = 2 points Minimum detectable change (90%CI) for single activity score = 3 points  COGNITION: 06/23/2024 Overall cognitive status: [facility]  SENSATION: 06/23/2024 WFL  POSTURE: 06/23/2024 Rounded shoulders, FHP.   UPPER EXTREMITY ROM:   ROM Left Eval 06/23/2024 Left 07/06/24 07/10/24 Left Left 07/21/2024 Left 07/31/24  Shoulder flexion < 20 deg with pain against gravity in supine  120 PROM in supine with pain Supine P: 144* A: 30* limited by pain & weakness Supine P: 108 110 AROM against gravity in sitting.   132 AROM in supine Supine AA: 135  Sitting:  A: 132   Shoulder extension       Shoulder abduction       Shoulder adduction       Shoulder internal rotation 90 deg PROM in supine 45 deg abduction Supine P: 55*  85 AROM in 45 deg abduction supine   Shoulder external rotation 10 deg PROM with pain, in supine 45 deg abduction Supine P: 66* Supine A: 50 P: 55 70 AROM in 45 deg abduction supine Supine A: 60 c mild pain, shoulder abd 45 deg  Elbow flexion       Elbow extension       Wrist flexion       Wrist extension       Wrist ulnar deviation       Wrist radial deviation       Wrist pronation       Wrist supination       (Blank rows = not tested)  UPPER EXTREMITY MMT:  MMT Right Eval 06/23/2024 Left Eval 06/23/2024 Left 07/21/2024  Shoulder flexion 5/5 1+/5 2+/5  Shoulder extension     Shoulder abduction 5/5 1+/5   Shoulder adduction     Shoulder internal rotation 5/5 4/5   Shoulder external rotation 5/5 1+/5 3/5  Middle trapezius     Lower trapezius     Elbow flexion 5/5    Elbow extension 5/5    Wrist flexion     Wrist extension     Wrist ulnar deviation     Wrist radial deviation     Wrist pronation     Wrist supination     Grip strength (lbs)     (Blank rows = not  tested)  SHOULDER SPECIAL TESTS: 06/23/2024 (+) Painful arc, drop arm, ER lag testing Lt arm  JOINT MOBILITY TESTING:  07/21/2024: Normal mobility in mid ranges, less muscle guarding to passive movement assessment compared to eval.   06/23/2024 No limits noted in inferior and posterior glides Lt shoulder.   PALPATION:  07/26/2024: Trigger points noted in Lt upper trap.   06/23/2024 Tenderness to light touch around Lt shoulder joint.  TODAY'S TREATMENT:                                                                                                       DATE:  08/02/2024 TherEx Side lying shoulder ER: 2 x 10 c 1 # weight Side lying shoulder Abd: 2 x 10 c 1 # weight Upper trap stretch x 2 bil holding chair seat 10 sec Scapular depression reaching towards foot of bed 10 reps  Self-Care: PT demo & verbal cues on using Thera Cane for trigger point mobilization. Pt verbalized and return demo understanding.   Neuro Re-Ed Supine scapular stabilization with blade 30 sec ea - flexion / extension, abduction / adduction & rotation.   TherAct UBE: x 2 minutes each direction Wall ladder LUE shoulder flexion & abduction with PT demo & verbal cues on technique including weight shift & following hand with eyes. 5 reps ea motion.  Pendulum following to relax shoulder.  Pulley with focus on eccentric lowering control Lt arm in flexion 3 mins Chest press c 3 # bar 2 x 10    Manaul Supine Lt shoulder inferior g2 GH joint glides, Posterior glides, PROM in all planes to pt's tolerance    TREATMENT:                                                                                                       DATE:  07/31/2024 TherEx Side lying shoulder ER: 2 x 10 c 1 # weight Side lying shoulder Abd: 2 x 10 c 1 #  weight Upper trap stretch x 2 bil holding 10 sec Neuro Re-Ed Supine shoulder flexion in mid range 45 deg to 100 deg c 2 # weight x 15 Supine shoulder protraction with circles performed both directions x 15 c 2 # weight TherAct Pulley with focus on eccentric lowering control Lt arm in flexion 3 mins Chest press c 3 # bar 2 x 10  Standing Rt arm assisting Lt arm into full flexion with outstretched arm, eccentric lowering x 10  UBE: x 2 minutes each direction Manaul Supine Lt shoulder inferior g2 GH joint glides, Posterior glides, PROM in all planes to pt's tolerance   TREATMENT:  DATE:  07/26/2024 Therex: Supine Lt shoulder flexion 1 lb weight 2 x 10  Sidelying Lt shoulder abduction 2 x 15 Sidelying Lt shoulder ER with towel under arm 1 lb 2 x 10   Neuro Re-ed (muscle activation, postural control, scapular control) Supine Lt shoulder 90 deg flexion small circles cw, ccw x 20 each with 1 lb weight.  Visual cues from clinician for circle size.  Supine SA press 2 lb weight 3 sec hold x 10 Lt arm   TherActivity (to improve functional reach, lifting) Pulley with focus on eccentric lowering control Lt arm in flexion 3 mins UE ranger assisted elevation into flexion, scaption x 15 each way Standing Rt arm assisting Lt arm into full flexion with outstretched arm, eccentric lowering x 10   Manual  Percussive device to Lt upper trap, levator for pain   TREATMENT:                                                                                                       DATE:  07/24/2024 Therex: UBE fwd/back 3 mins each way lvl 1.0 Supine Lt shoulder AROM flexion 2 x 15  Supine 2 lb wand flexion AAROM stretch 2-3 sec hold 2 x 10  Sidelying Lt shoulder abduction 2 x 10 Sidelying Lt shoulder ER with towel under arm 2 x 15   Neuro Re-ed (muscle activation, postural control, scapular control) Standing  green band rows bilateral 2 x 15 with scapular retraction focus Standing green band gh ext 2 x 15 bilaterally  Supine SA press in 90 deg flexion with tactile cues 3 sec hold x 10 bilaterally performed.    HOME EXERCISE PROGRAM: Access Code: [email or web address] URL: [email or web address] Date: 07/21/2024 Prepared by: Ozell Silvan  Exercises - Seated Scapular Retraction  - 3-5 x daily - 7 x weekly - 1 sets - 10 reps - 3-5 hold - Seated Isometric Shoulder External Rotation with Towel (Mirrored)  - 1-2 x daily - 7 x weekly - 1 sets - 10 reps - 5 hold - Standing Isometric Shoulder Flexion with Doorway - Arm Bent (Mirrored)  - 1-2 x daily - 7 x weekly - 1 sets - 10 reps - 5 hold - Circular Shoulder Pendulum with Table Support  - 2 x daily - 7 x weekly - 1 sets - 10 reps - Supine Shoulder Flexion Extension Full Range AROM (Mirrored)  - 1-2 x daily - 7 x weekly - 1-2 sets - 10-20 reps - Sidelying Shoulder Abduction Palm Forward (Mirrored)  - 1-2 x daily - 7 x weekly - 2-3 sets - 10-15 reps - Sidelying Shoulder External Rotation  - 1-2 x daily - 7 x weekly - 2-3 sets - 10-15 reps  ASSESSMENT:  CLINICAL IMPRESSION: Patient continue to be limited by pain.  She has improved active functional motion but needs reminders for head and shoulder posture with activities.    OBJECTIVE IMPAIRMENTS: decreased activity tolerance, decreased balance, decreased coordination, decreased endurance, decreased mobility, decreased ROM, decreased strength, hypomobility, increased fascial restrictions, impaired perceived functional ability, increased muscle spasms, impaired flexibility,  impaired UE functional use, improper body mechanics, postural dysfunction, and pain.   ACTIVITY LIMITATIONS: carrying, lifting, sleeping, bed mobility, bathing, toileting, dressing, self feeding, reach over head, and hygiene/grooming  PARTICIPATION LIMITATIONS: meal prep, cleaning, laundry, interpersonal relationship, driving,  shopping, and community activity  PERSONAL FACTORS: Medical history: arthritis, chronic back pain(stenosis), GERD, hyperlipidemia, HTN, OA, history of Lt knee troubles.  are also affecting patient's functional outcome.   REHAB POTENTIAL: Fair to good  CLINICAL DECISION MAKING: Evolving/moderate complexity  EVALUATION COMPLEXITY: Moderate   GOALS: Goals reviewed with patient? Yes  SHORT TERM GOALS: (target date for Short term goals are 3 weeks 07/14/2024)  1.Patient will demonstrate independent use of home exercise program to maintain progress from in clinic treatments. Goal status: MET 07/10/24  LONG TERM GOALS: (target dates for all long term goals are 10 weeks  09/01/2024 )   1. Patient will demonstrate/report pain at worst less than or equal to 2/10 to facilitate minimal limitation in daily activity secondary to pain symptoms. Goal status: Ongoing  07/31/2024   2. Patient will demonstrate independent use of home exercise program to facilitate ability to maintain/progress functional gains from skilled physical therapy services. Goal status: Ongoing  07/10/2024   3. Patient will demonstrate Patient specific functional scale avg > or = 7/10 to indicate reduced disability due to condition.  Goal status: Ongoing  07/10/2024 4.33 today   4.  Patient will demonstrate Lt  UE MMT 4/5 or greater throughout to facilitate lifting, reaching, carrying at [facility] in daily activity.   Goal status: Ongoing  07/04/2024   5.  Patient will demonstrate Lt  [facility] joint AROM WFL s symptoms to facilitate usual overhead reaching, self care, dressing at PLOF.    Goal status: Ongoing  06/30/2024   6.  Patient will demonstrate/report ability to sleep s restriction due to symptoms.  Goal status: Ongoing  07/31/2024     PLAN:  PT FREQUENCY: 1-2x/week  PT DURATION: 10 weeks  PLANNED INTERVENTIONS: Can include 02853- PT Re-evaluation, 97110-Therapeutic exercises, 97530- Therapeutic activity, 97112-  Neuromuscular re-education, 97535- Self Care, 97140- Manual therapy, [phone]- Gait training, [phone]- Orthotic Fit/training, [phone]- Canalith repositioning, [id number]- Aquatic Therapy, [phone]- Electrical stimulation (unattended), [id number] Physical performance testing, 97016- Vasopneumatic device, [id number]- Ultrasound, [id number]- Traction (mechanical), [id number]- Ionotophoresis 4mg /ml Dexamethasone ,  [id number] - Needle insertion w/o injection 1 or 2 muscles, 20561 - Needle insertion w/o injection 3 or more muscles.   Patient/Family education, Balance training, Stair training, Taping, Dry Needling, Joint mobilization, Joint manipulation, Spinal manipulation, Spinal mobilization, Scar mobilization, Vestibular training, Visual/preceptual remediation/compensation, DME instructions, Cryotherapy, and Moist heat.  All performed as medically necessary.  All included unless contraindicated  PLAN FOR NEXT SESSION:   continue to progress functional strength with cues on posture.      Grayce Spatz, PT, DPT 08/02/2024, 11:56 AM  "

## 2024-08-07 ENCOUNTER — Encounter: Payer: Self-pay | Admitting: Physical Therapy

## 2024-08-07 ENCOUNTER — Ambulatory Visit: Admitting: Physical Therapy

## 2024-08-07 DIAGNOSIS — R293 Abnormal posture: Secondary | ICD-10-CM | POA: Diagnosis not present

## 2024-08-07 DIAGNOSIS — M6281 Muscle weakness (generalized): Secondary | ICD-10-CM

## 2024-08-07 DIAGNOSIS — M25512 Pain in left shoulder: Secondary | ICD-10-CM | POA: Diagnosis not present

## 2024-08-07 DIAGNOSIS — G8929 Other chronic pain: Secondary | ICD-10-CM

## 2024-08-07 NOTE — Therapy (Signed)
 " OUTPATIENT PHYSICAL THERAPY TREATMENT    Patient Name: Melissa Maldonado MRN: [id number] DOB:Oct 04, 1940, 84 y.o., female Today's Date: 08/07/2024  END OF SESSION:  PT End of Session - 08/07/24 0946     Visit Number 10    Number of Visits 20    Date for Recertification  09/01/24    Authorization Type Medicare    Authorization Time Period PN sent at visit 4    Progress Note Due on Visit 14    PT Start Time 0930    PT Stop Time 1010    PT Time Calculation (min) 40 min    Activity Tolerance Patient tolerated treatment well    Behavior During Therapy [facility] for tasks assessed/performed                Past Medical History:  Diagnosis Date   Arthritis    Chronic back pain    lumbar stenosis   Dysrhythmia    Early cataracts, bilateral    GERD (gastroesophageal reflux disease)    occasionally but no meds required   History of blood transfusion    History of colon polyps    Hyperlipidemia    takes Lipitor every other day   Hypertension    takes losartan  daily   Neuropathy    Osteoarthritis of left knee 04/27/2014   Restless leg    Shortness of breath    with exertion   SOB (shortness of breath) 2013   CPET   Tear of medial meniscus of left knee 09/08/2013   Past Surgical History:  Procedure Laterality Date   APPENDECTOMY     BACK SURGERY  07/20/2008   lumb fusion   CARDIOVERSION N/A 01/29/2023   Procedure: CARDIOVERSION;  Surgeon: Alvan Ronal BRAVO, MD;  Location: [facility];  Service: Cardiovascular;  Laterality: N/A;   COLONOSCOPY  2018   EYE SURGERY     both cataracts   KNEE ARTHROSCOPY WITH MEDIAL MENISECTOMY Left 09/08/2013   Procedure: LEFT KNEE ARTHROSCOPY WITH PARTIAL MEDIAL MENISCECTOMY;  Surgeon: Fonda SHAUNNA Olmsted, MD;  Location: [facility];  Service: Orthopedics;  Laterality: Left;   LUMBAR LAMINECTOMY/DECOMPRESSION MICRODISCECTOMY  05/13/2012   Procedure: LUMBAR LAMINECTOMY/DECOMPRESSION MICRODISCECTOMY 1 LEVEL;  Surgeon: Victory Gens, MD;  Location: [facility];  Service: Neurosurgery;  Laterality: Bilateral;  Bilateral Lumbar one -two Decompressive Laminectomy   PARTIAL KNEE ARTHROPLASTY Left 04/27/2014   Procedure: LEFT UNICOMPARTMENTAL KNEE;  Surgeon: Fonda SHAUNNA Olmsted, MD;  Location: [facility];  Service: Orthopedics;  Laterality: Left;   TOTAL KNEE ARTHROPLASTY Right 11/06/2022   Procedure: RIGHT TOTAL KNEE ARTHROPLASTY;  Surgeon: Vernetta Lonni GRADE, MD;  Location: [facility];  Service: Orthopedics;  Laterality: Right;   TUBAL LIGATION  1974   UPPER GI ENDOSCOPY     Patient Active Problem List   Diagnosis Date Noted   Persistent atrial fibrillation (HCC) 01/25/2023   Hypercoagulable state due to persistent atrial fibrillation (HCC) 01/25/2023   Status post total right knee replacement 11/06/2022   Arthritis of right acromioclavicular joint 08/28/2021   Impingement syndrome of right shoulder 08/19/2021   Nontraumatic incomplete tear of right rotator cuff 08/19/2021   Paresthesia 12/05/2020   Low back pain with right-sided sciatica 12/05/2020   Trochanteric bursitis, right hip 04/24/2020   Essential hypertension 01/12/2020   Sinus bradycardia 01/12/2020   Dyspnea on exertion 01/12/2020   Lumbar stenosis with neurogenic claudication 02/01/2017   Osteoarthritis of left knee 04/27/2014    PCP: Clarice Nottingham  MD  REFERRING PROVIDER: Burnetta Brunet, DO  REFERRING DIAG: [phone] (ICD-10-CM) - Chronic left shoulder pain M19.012 (ICD-10-CM) - Primary osteoarthritis, left shoulder M75.102,M12.812 (ICD-10-CM) - Rotator cuff tear arthropathy of left shoulder  THERAPY DIAG:  Chronic left shoulder pain  Muscle weakness (generalized)  Abnormal posture  Rationale for Evaluation and Treatment: Rehabilitation  ONSET DATE: Oct or Nov 2025.   SUBJECTIVE:                                                                                                                                                                                       SUBJECTIVE STATEMENT: Pt arriving today reporting 4/10 pain in her Left shoulder.   Rt hand dominant.   PERTINENT HISTORY: Medical history: arthritis, chronic back pain(stenosis), GERD, hyperlipidemia, HTN, OA, history of Lt knee troubles.   PAIN:  NPRS scale:  4/ 10  Pain location: Lt shoulder, anterior/lateral shoulder, upper Trapezius Pain description: ache Aggravating factors: reaching, lifting, getting dressed.  Relieving factors: topical cream helps short term  PRECAUTIONS: None  WEIGHT BEARING RESTRICTIONS: No  FALLS:  Has patient fallen in last 6 months? No  LIVING ENVIRONMENT: Lives in: House/apartment  OCCUPATION: Retired  PLOF: Independent, go to gym.   PATIENT GOALS: Reduce pain,    OBJECTIVE:   DIAGNOSTIC FINDINGS:  05/30/2024 xray:   X-rays of the shoulder show significant degenerative spurring of the  glenohumeral joint with significant joint space narrowing.   06/27/24:  IMPRESSION: 1. Severe tendinosis of the supraspinatus tendon with a shallow partial-thickness bursal surface tear towards the anterior insertion. 2. Severe infraspinatus tendinosis with a interstitial tear measuring 16 mm medial-lateral and 7 mm AP towards the anterior aspect. 3. Severe subscapularis tendinosis with a small partial-thickness articular surface tear. 4. Moderate tendinosis of the intra-articular portion of the long head of the biceps tendon. 5. Severe subacromial/subdeltoid bursitis. 6. Moderate-severe osteoarthritis of the glenohumeral joint.  PATIENT SURVEYS:  Patient-Specific Activity Scoring Scheme  0 represents unable to perform. 10 represents able to perform at prior level. 0 1 2 3 4 5 6 7 8 9  10 (Date and Score)   Activity Eval  06/23/24 07/10/24  08/07/24  1. sleep  4 6  8   2. Getting dressed  2  7 8   3. Reaching, lifting household 0 0 6  Score 2 avg 4.33 7.3   Total score = sum of the activity  scores/number of activities Minimum detectable change (90%CI) for average score = 2 points Minimum detectable change (90%CI) for single activity score = 3 points  COGNITION: 06/23/2024 Overall cognitive status: WFL     SENSATION: 06/23/2024 WFL  POSTURE: 06/23/2024 Rounded shoulders, FHP.  UPPER EXTREMITY ROM:   ROM Left Eval 06/23/2024 Left 07/06/24 07/10/24 Left Left 07/21/2024 Left 07/31/24  Shoulder flexion < 20 deg with pain against gravity in supine  120 PROM in supine with pain Supine P: 144* A: 30* limited by pain & weakness Supine P: 108 110 AROM against gravity in sitting.   132 AROM in supine Supine AA: 135  Sitting:  A: 132   Shoulder extension       Shoulder abduction       Shoulder adduction       Shoulder internal rotation 90 deg PROM in supine 45 deg abduction Supine P: 55*  85 AROM in 45 deg abduction supine   Shoulder external rotation 10 deg PROM with pain, in supine 45 deg abduction Supine P: 66* Supine A: 50 P: 55 70 AROM in 45 deg abduction supine Supine A: 60 c mild pain, shoulder abd 45 deg  Elbow flexion       Elbow extension       Wrist flexion       Wrist extension       Wrist ulnar deviation       Wrist radial deviation       Wrist pronation       Wrist supination       (Blank rows = not tested)  UPPER EXTREMITY MMT:  MMT Right Eval 06/23/2024 Left Eval 06/23/2024 Left 07/21/2024  Shoulder flexion 5/5 1+/5 2+/5  Shoulder extension     Shoulder abduction 5/5 1+/5   Shoulder adduction     Shoulder internal rotation 5/5 4/5   Shoulder external rotation 5/5 1+/5 3/5  Middle trapezius     Lower trapezius     Elbow flexion 5/5    Elbow extension 5/5    Wrist flexion     Wrist extension     Wrist ulnar deviation     Wrist radial deviation     Wrist pronation     Wrist supination     Grip strength (lbs)     (Blank rows = not tested)  SHOULDER SPECIAL TESTS: 06/23/2024 (+) Painful arc, drop arm, ER lag testing Lt  arm  JOINT MOBILITY TESTING:  07/21/2024: Normal mobility in mid ranges, less muscle guarding to passive movement assessment compared to eval.   06/23/2024 No limits noted in inferior and posterior glides Lt shoulder.   PALPATION:  07/26/2024: Trigger points noted in Lt upper trap.   06/23/2024 Tenderness to light touch around Lt shoulder joint.     ---------------------------------------------------------------------------------------------------------------------------------------------------------  TODAY'S TREATMENT:                                                                                                       DATE:  08/07/2024 TherEx Side lying shoulder ER: 2 x 10 c 1 # weight Side lying shoulder Abd: 2 x 10 c 1 # weight Supine shoulder flexion 2 # bar 2 x 10  Supine chest press: 2 x 10  Neuro Re-Ed Supine scapular stabilization with blade 30 sec ea - flexion / extension, abduction / adduction & rotation.  TherActivites:  Nustep: UE/LE push pull x 8 minutes Wall ladder LUE shoulder abduction x 10  Reaching up to 1st clinic shelf in gym x 10 , repeated c 1 #  Reaching to second clinic shelf in gym x 10 , repeated c 1 #  Manaul Supine Lt shoulder inferior g2 GH joint glides, Posterior glides, PROM in all planes to pt's tolerance    TREATMENT:                                                                                                       DATE:  08/02/2024 TherEx Side lying shoulder ER: 2 x 10 c 1 # weight Side lying shoulder Abd: 2 x 10 c 1 # weight Upper trap stretch x 2 bil holding chair seat 10 sec Scapular depression reaching towards foot of bed 10 reps  Self-Care: PT demo & verbal cues on using Thera Cane for trigger point mobilization. Pt verbalized and return demo  understanding.   Neuro Re-Ed Supine scapular stabilization with blade 30 sec ea - flexion / extension, abduction / adduction & rotation.   TherAct UBE: x 2 minutes each direction Wall ladder LUE shoulder flexion & abduction with PT demo & verbal cues on technique including weight shift & following hand with eyes. 5 reps ea motion.  Pendulum following to relax shoulder.  Pulley with focus on eccentric lowering control Lt arm in flexion 3 mins Chest press c 3 # bar 2 x 10    Manaul Supine Lt shoulder inferior g2 GH joint glides, Posterior glides, PROM in all planes to pt's tolerance    TREATMENT:  DATE:  07/31/2024 TherEx Side lying shoulder ER: 2 x 10 c 1 # weight Side lying shoulder Abd: 2 x 10 c 1 # weight Upper trap stretch x 2 bil holding 10 sec Neuro Re-Ed Supine shoulder flexion in mid range 45 deg to 100 deg c 2 # weight x 15 Supine shoulder protraction with circles performed both directions x 15 c 2 # weight TherAct Pulley with focus on eccentric lowering control Lt arm in flexion 3 mins Chest press c 3 # bar 2 x 10  Standing Rt arm assisting Lt arm into full flexion with outstretched arm, eccentric lowering x 10  UBE: x 2 minutes each direction Manaul Supine Lt shoulder inferior g2 GH joint glides, Posterior glides, PROM in all planes to pt's tolerance      HOME EXERCISE PROGRAM: Access Code: [email or web address] URL: [email or web address] Date: 07/21/2024 Prepared by: Ozell Silvan  Exercises - Seated Scapular Retraction  - 3-5 x daily - 7 x weekly - 1 sets - 10 reps - 3-5 hold - Seated Isometric Shoulder External Rotation with Towel (Mirrored)  - 1-2 x daily - 7 x weekly - 1 sets - 10 reps - 5 hold - Standing Isometric Shoulder Flexion with Doorway - Arm Bent (Mirrored)  - 1-2 x daily - 7 x weekly - 1 sets - 10 reps - 5 hold - Circular Shoulder Pendulum with  Table Support  - 2 x daily - 7 x weekly - 1 sets - 10 reps - Supine Shoulder Flexion Extension Full Range AROM (Mirrored)  - 1-2 x daily - 7 x weekly - 1-2 sets - 10-20 reps - Sidelying Shoulder Abduction Palm Forward (Mirrored)  - 1-2 x daily - 7 x weekly - 2-3 sets - 10-15 reps - Sidelying Shoulder External Rotation  - 1-2 x daily - 7 x weekly - 2-3 sets - 10-15 reps  ASSESSMENT:  CLINICAL IMPRESSION: Pt reporting 4/10 pain upon arrival today. Pt limited with over head reaching due to increased pain and weakness. Pt's PSFS has improved since her initial evaluation. Recommending continuation of skilled PT interventions.    OBJECTIVE IMPAIRMENTS: decreased activity tolerance, decreased balance, decreased coordination, decreased endurance, decreased mobility, decreased ROM, decreased strength, hypomobility, increased fascial restrictions, impaired perceived functional ability, increased muscle spasms, impaired flexibility, impaired UE functional use, improper body mechanics, postural dysfunction, and pain.   ACTIVITY LIMITATIONS: carrying, lifting, sleeping, bed mobility, bathing, toileting, dressing, self feeding, reach over head, and hygiene/grooming  PARTICIPATION LIMITATIONS: meal prep, cleaning, laundry, interpersonal relationship, driving, shopping, and community activity  PERSONAL FACTORS: Medical history: arthritis, chronic back pain(stenosis), GERD, hyperlipidemia, HTN, OA, history of Lt knee troubles.  are also affecting patient's functional outcome.   REHAB POTENTIAL: Fair to good  CLINICAL DECISION MAKING: Evolving/moderate complexity  EVALUATION COMPLEXITY: Moderate   GOALS: Goals reviewed with patient? Yes  SHORT TERM GOALS: (target date for Short term goals are 3 weeks 07/14/2024)  1.Patient will demonstrate independent use of home exercise program to maintain progress from in clinic treatments. Goal status: MET 07/10/24  LONG TERM GOALS: (target dates for all long term  goals are 10 weeks  09/01/2024 )   1. Patient will demonstrate/report pain at worst less than or equal to 2/10 to facilitate minimal limitation in daily activity secondary to pain symptoms. Goal status: Ongoing  07/31/2024   2. Patient will demonstrate independent use of home exercise program to facilitate ability to maintain/progress functional gains from skilled physical therapy services. Goal status: Ongoing  07/10/2024  3. Patient will demonstrate Patient specific functional scale avg > or = 7/10 to indicate reduced disability due to condition.  Goal status: Met 08/07/24   4.  Patient will demonstrate Lt  UE MMT 4/5 or greater throughout to facilitate lifting, reaching, carrying at [facility] in daily activity.   Goal status: Ongoing  07/04/2024   5.  Patient will demonstrate Lt  [facility] joint AROM WFL s symptoms to facilitate usual overhead reaching, self care, dressing at PLOF.    Goal status: Ongoing  06/30/2024   6.  Patient will demonstrate/report ability to sleep s restriction due to symptoms.  Goal status: Ongoing  07/31/2024     PLAN:  PT FREQUENCY: 1-2x/week  PT DURATION: 10 weeks  PLANNED INTERVENTIONS: Can include 02853- PT Re-evaluation, 97110-Therapeutic exercises, 97530- Therapeutic activity, 97112- Neuromuscular re-education, 97535- Self Care, 97140- Manual therapy, [phone]- Gait training, [phone]- Orthotic Fit/training, [phone]- Canalith repositioning, [id number]- Aquatic Therapy, [phone]- Electrical stimulation (unattended), [id number] Physical performance testing, 97016- Vasopneumatic device, [id number]- Ultrasound, [id number]- Traction (mechanical), [id number]- Ionotophoresis 4mg /ml Dexamethasone ,  [id number] - Needle insertion w/o injection 1 or 2 muscles, 20561 - Needle insertion w/o injection 3 or more muscles.   Patient/Family education, Balance training, Stair training, Taping, Dry Needling, Joint mobilization, Joint manipulation, Spinal manipulation, Spinal mobilization, Scar mobilization, Vestibular  training, Visual/preceptual remediation/compensation, DME instructions, Cryotherapy, and Moist heat.  All performed as medically necessary.  All included unless contraindicated  PLAN FOR NEXT SESSION: functional strength gains, postural strengthening      Delon JONELLE Lunger, PT, MPT 08/07/2024, 10:05 AM  "

## 2024-08-09 ENCOUNTER — Ambulatory Visit: Admitting: Physical Therapy

## 2024-08-09 ENCOUNTER — Encounter: Payer: Self-pay | Admitting: Physical Therapy

## 2024-08-09 DIAGNOSIS — R293 Abnormal posture: Secondary | ICD-10-CM | POA: Diagnosis not present

## 2024-08-09 DIAGNOSIS — G8929 Other chronic pain: Secondary | ICD-10-CM

## 2024-08-09 DIAGNOSIS — M25512 Pain in left shoulder: Secondary | ICD-10-CM

## 2024-08-09 DIAGNOSIS — M6281 Muscle weakness (generalized): Secondary | ICD-10-CM | POA: Diagnosis not present

## 2024-08-09 NOTE — Therapy (Signed)
 " OUTPATIENT PHYSICAL THERAPY TREATMENT    Patient Name: Melissa Maldonado MRN: [id number] DOB:[birth year], 84 y.o., female Today's Date: 08/09/2024  END OF SESSION:  PT End of Session - 08/09/24 0937     Visit Number 11    Number of Visits 20    Date for Recertification  09/01/24    Authorization Type Medicare    Authorization Time Period PN sent at visit 4    Progress Note Due on Visit 14    PT Start Time 0930    PT Stop Time 1010    PT Time Calculation (min) 40 min    Activity Tolerance Patient tolerated treatment well    Behavior During Therapy [facility] for tasks assessed/performed          Past Medical History:  Diagnosis Date   Arthritis    Chronic back pain    lumbar stenosis   Dysrhythmia    Early cataracts, bilateral    GERD (gastroesophageal reflux disease)    occasionally but no meds required   History of blood transfusion    History of colon polyps    Hyperlipidemia    takes Lipitor every other day   Hypertension    takes losartan  daily   Neuropathy    Osteoarthritis of left knee 04/27/2014   Restless leg    Shortness of breath    with exertion   SOB (shortness of breath) 2013   CPET   Tear of medial meniscus of left knee 09/08/2013   Past Surgical History:  Procedure Laterality Date   APPENDECTOMY     BACK SURGERY  07/20/2008   lumb fusion   CARDIOVERSION N/A 01/29/2023   Procedure: CARDIOVERSION;  Surgeon: Alvan Ronal BRAVO, MD;  Location: [facility];  Service: Cardiovascular;  Laterality: N/A;   COLONOSCOPY  2018   EYE SURGERY     both cataracts   KNEE ARTHROSCOPY WITH MEDIAL MENISECTOMY Left 09/08/2013   Procedure: LEFT KNEE ARTHROSCOPY WITH PARTIAL MEDIAL MENISCECTOMY;  Surgeon: Fonda SHAUNNA Olmsted, MD;  Location: [facility];  Service: Orthopedics;  Laterality: Left;   LUMBAR LAMINECTOMY/DECOMPRESSION MICRODISCECTOMY  05/13/2012   Procedure: LUMBAR LAMINECTOMY/DECOMPRESSION MICRODISCECTOMY 1 LEVEL;  Surgeon: Victory Gens, MD;   Location: [facility];  Service: Neurosurgery;  Laterality: Bilateral;  Bilateral Lumbar one -two Decompressive Laminectomy   PARTIAL KNEE ARTHROPLASTY Left 04/27/2014   Procedure: LEFT UNICOMPARTMENTAL KNEE;  Surgeon: Fonda SHAUNNA Olmsted, MD;  Location: [facility];  Service: Orthopedics;  Laterality: Left;   TOTAL KNEE ARTHROPLASTY Right 11/06/2022   Procedure: RIGHT TOTAL KNEE ARTHROPLASTY;  Surgeon: Vernetta Lonni GRADE, MD;  Location: [facility];  Service: Orthopedics;  Laterality: Right;   TUBAL LIGATION  1974   UPPER GI ENDOSCOPY     Patient Active Problem List   Diagnosis Date Noted   Persistent atrial fibrillation (HCC) 01/25/2023   Hypercoagulable state due to persistent atrial fibrillation (HCC) 01/25/2023   Status post total right knee replacement 11/06/2022   Arthritis of right acromioclavicular joint 08/28/2021   Impingement syndrome of right shoulder 08/19/2021   Nontraumatic incomplete tear of right rotator cuff 08/19/2021   Paresthesia 12/05/2020   Low back pain with right-sided sciatica 12/05/2020   Trochanteric bursitis, right hip 04/24/2020   Essential hypertension 01/12/2020   Sinus bradycardia 01/12/2020   Dyspnea on exertion 01/12/2020   Lumbar stenosis with neurogenic claudication 02/01/2017   Osteoarthritis of left knee 04/27/2014    PCP: Clarice Nottingham MD  REFERRING PROVIDER: Burnetta Brunet,  DO  REFERRING DIAG: M25.512,G89.29 (ICD-10-CM) - Chronic left shoulder pain M19.012 (ICD-10-CM) - Primary osteoarthritis, left shoulder M75.102,M12.812 (ICD-10-CM) - Rotator cuff tear arthropathy of left shoulder  THERAPY DIAG:  Chronic left shoulder pain  Muscle weakness (generalized)  Abnormal posture  Rationale for Evaluation and Treatment: Rehabilitation  ONSET DATE: Oct or Nov 2025.   SUBJECTIVE:                                                                                                                                                                                       SUBJECTIVE STATEMENT: She had rough night last night with shoulder which thinks may be related to weather changes. Rt hand dominant.   PERTINENT HISTORY: Medical history: arthritis, chronic back pain(stenosis), GERD, hyperlipidemia, HTN, OA, history of Lt knee troubles.   PAIN:  NPRS scale:  in last week lowest 4/10 highest 8/10  Pain location: Lt shoulder, anterior/lateral shoulder, upper Trapezius Pain description: ache Aggravating factors: reaching, lifting, getting dressed.  Relieving factors: topical cream helps short term  PRECAUTIONS: None  WEIGHT BEARING RESTRICTIONS: No  FALLS:  Has patient fallen in last 6 months? No  LIVING ENVIRONMENT: Lives in: House/apartment  OCCUPATION: Retired  PLOF: Independent, go to gym.   PATIENT GOALS: Reduce pain,    OBJECTIVE:   DIAGNOSTIC FINDINGS:  05/30/2024 xray:   X-rays of the shoulder show significant degenerative spurring of the  glenohumeral joint with significant joint space narrowing.   06/27/24:  IMPRESSION: 1. Severe tendinosis of the supraspinatus tendon with a shallow partial-thickness bursal surface tear towards the anterior insertion. 2. Severe infraspinatus tendinosis with a interstitial tear measuring 16 mm medial-lateral and 7 mm AP towards the anterior aspect. 3. Severe subscapularis tendinosis with a small partial-thickness articular surface tear. 4. Moderate tendinosis of the intra-articular portion of the long head of the biceps tendon. 5. Severe subacromial/subdeltoid bursitis. 6. Moderate-severe osteoarthritis of the glenohumeral joint.  PATIENT SURVEYS:  Patient-Specific Activity Scoring Scheme  0 represents unable to perform. 10 represents able to perform at prior level. 0 1 2 3 4 5 6 7 8 9  10 (Date and Score)   Activity Eval  06/23/24 07/10/24  08/07/24  1. sleep  4 6  8   2. Getting dressed  2  7 8   3. Reaching, lifting household 0 0 6  Score 2 avg  4.33 7.3   Total score = sum of the activity scores/number of activities Minimum detectable change (90%CI) for average score = 2 points Minimum detectable change (90%CI) for single activity score = 3 points  COGNITION: 06/23/2024 Overall cognitive status: WFL     SENSATION: 06/23/2024 WFL  POSTURE: 06/23/2024  Rounded shoulders, FHP.   UPPER EXTREMITY ROM:   ROM Left Eval 06/23/2024 Left 07/06/24 07/10/24 Left Left 07/21/2024 Left 07/31/24  Shoulder flexion < 20 deg with pain against gravity in supine  120 PROM in supine with pain Supine P: 144* A: 30* limited by pain & weakness Supine P: 108 110 AROM against gravity in sitting.   132 AROM in supine Supine AA: 135  Sitting:  A: 132   Shoulder extension       Shoulder abduction       Shoulder adduction       Shoulder internal rotation 90 deg PROM in supine 45 deg abduction Supine P: 55*  85 AROM in 45 deg abduction supine   Shoulder external rotation 10 deg PROM with pain, in supine 45 deg abduction Supine P: 66* Supine A: 50 P: 55 70 AROM in 45 deg abduction supine Supine A: 60 c mild pain, shoulder abd 45 deg  Elbow flexion       Elbow extension       Wrist flexion       Wrist extension       Wrist ulnar deviation       Wrist radial deviation       Wrist pronation       Wrist supination       (Blank rows = not tested)  UPPER EXTREMITY MMT:  MMT Right Eval 06/23/2024 Left Eval 06/23/2024 Left 07/21/2024  Shoulder flexion 5/5 1+/5 2+/5  Shoulder extension     Shoulder abduction 5/5 1+/5   Shoulder adduction     Shoulder internal rotation 5/5 4/5   Shoulder external rotation 5/5 1+/5 3/5  Middle trapezius     Lower trapezius     Elbow flexion 5/5    Elbow extension 5/5    Wrist flexion     Wrist extension     Wrist ulnar deviation     Wrist radial deviation     Wrist pronation     Wrist supination     Grip strength (lbs)     (Blank rows = not tested)  SHOULDER SPECIAL TESTS: 06/23/2024 (+)  Painful arc, drop arm, ER lag testing Lt arm  JOINT MOBILITY TESTING:  07/21/2024: Normal mobility in mid ranges, less muscle guarding to passive movement assessment compared to eval.   06/23/2024 No limits noted in inferior and posterior glides Lt shoulder.   PALPATION:  07/26/2024: Trigger points noted in Lt upper trap.   06/23/2024 Tenderness to light touch around Lt shoulder joint.     ---------------------------------------------------------------------------------------------------------------------------------------------------------  TODAY'S TREATMENT:                                                                                                       DATE:  08/09/2024 TherEx Pulley AAROM with focus on posture and full motion with eccentric control (minimal pulley assist) 2 min ea for flexion & scapation.   Neuro Re-Ed Posture stabilization seated with chin tucks 10 reps & scapular retraction 10 reps with PT tactile & verbal cues.  Supine scapular stabilization with blade 30 sec ea - flexion / extension, abduction / adduction & rotation.   TherActivites:  Red theraband reactive ER stabilizaton step outs 5 reps, then active ER with PT cues on posture.   Red theraband reactive IR stabilizaton step outs 5 reps, then active IR with PT cues on posture.  Reaching up to 1st clinic shelf in gym x 10 , repeated c 1 #  Reaching to second clinic shelf in gym x 10 , repeated c 1 #   Manual Soft tissue mob to upper trapezius trigger points.   Self-Care: Pt requested education on bra selection to address shoulder pain.  PT educated on need for posture which supporting breast will effect.  PT instructed in measuring chest & breast for proper size.  Design should have material covering over large area for  support especially in upper back that facilitates upright posture.  Pt verbalized understanding.    TREATMENT:                                                                                                       DATE:  08/07/2024 TherEx Side lying shoulder ER: 2 x 10 c 1 # weight Side lying shoulder Abd: 2 x 10 c 1 # weight Supine shoulder flexion 2 # bar 2 x 10  Supine chest press: 2 x 10  Neuro Re-Ed Supine scapular stabilization with blade 30 sec ea - flexion / extension, abduction / adduction & rotation.  TherActivites:  Nustep: UE/LE push pull x 8 minutes Wall ladder LUE shoulder abduction x 10  Reaching up to 1st clinic shelf in gym x 10 , repeated c 1 #  Reaching to second clinic shelf in gym x 10 , repeated c 1 #  Manaul Supine Lt shoulder inferior g2 GH joint glides, Posterior glides, PROM in all planes to pt's tolerance    TREATMENT:  DATE:  08/02/2024 TherEx Side lying shoulder ER: 2 x 10 c 1 # weight Side lying shoulder Abd: 2 x 10 c 1 # weight Upper trap stretch x 2 bil holding chair seat 10 sec Scapular depression reaching towards foot of bed 10 reps  Self-Care: PT demo & verbal cues on using Thera Cane for trigger point mobilization. Pt verbalized and return demo understanding.   Neuro Re-Ed Supine scapular stabilization with blade 30 sec ea - flexion / extension, abduction / adduction & rotation.   TherAct UBE: x 2 minutes each direction Wall ladder LUE shoulder flexion & abduction with PT demo & verbal cues on technique including weight shift & following hand with eyes. 5 reps ea motion.  Pendulum following to relax shoulder.  Pulley with focus on eccentric lowering control Lt arm in flexion 3 mins Chest press c 3 # bar 2 x 10    Manaul Supine Lt shoulder inferior g2 GH joint glides, Posterior glides, PROM in all planes to pt's tolerance    TREATMENT:                                                                                                        DATE:  07/31/2024 TherEx Side lying shoulder ER: 2 x 10 c 1 # weight Side lying shoulder Abd: 2 x 10 c 1 # weight Upper trap stretch x 2 bil holding 10 sec Neuro Re-Ed Supine shoulder flexion in mid range 45 deg to 100 deg c 2 # weight x 15 Supine shoulder protraction with circles performed both directions x 15 c 2 # weight TherAct Pulley with focus on eccentric lowering control Lt arm in flexion 3 mins Chest press c 3 # bar 2 x 10  Standing Rt arm assisting Lt arm into full flexion with outstretched arm, eccentric lowering x 10  UBE: x 2 minutes each direction Manaul Supine Lt shoulder inferior g2 GH joint glides, Posterior glides, PROM in all planes to pt's tolerance      HOME EXERCISE PROGRAM: Access Code: [email or web address] URL: [email or web address] Date: 07/21/2024 Prepared by: Ozell Silvan  Exercises - Seated Scapular Retraction  - 3-5 x daily - 7 x weekly - 1 sets - 10 reps - 3-5 hold - Seated Isometric Shoulder External Rotation with Towel (Mirrored)  - 1-2 x daily - 7 x weekly - 1 sets - 10 reps - 5 hold - Standing Isometric Shoulder Flexion with Doorway - Arm Bent (Mirrored)  - 1-2 x daily - 7 x weekly - 1 sets - 10 reps - 5 hold - Circular Shoulder Pendulum with Table Support  - 2 x daily - 7 x weekly - 1 sets - 10 reps - Supine Shoulder Flexion Extension Full Range AROM (Mirrored)  - 1-2 x daily - 7 x weekly - 1-2 sets - 10-20 reps - Sidelying Shoulder Abduction Palm Forward (Mirrored)  - 1-2 x daily - 7 x weekly - 2-3 sets - 10-15 reps - Sidelying Shoulder External Rotation  - 1-2 x daily - 7 x weekly - 2-3 sets - 10-15  reps  ASSESSMENT:  CLINICAL IMPRESSION: Patient appears to understand importance of posture for shoulder functions but needs reminders.  Patient appears to understand use of pulleys for full range assisted motions.      OBJECTIVE IMPAIRMENTS:  decreased activity tolerance, decreased balance, decreased coordination, decreased endurance, decreased mobility, decreased ROM, decreased strength, hypomobility, increased fascial restrictions, impaired perceived functional ability, increased muscle spasms, impaired flexibility, impaired UE functional use, improper body mechanics, postural dysfunction, and pain.   ACTIVITY LIMITATIONS: carrying, lifting, sleeping, bed mobility, bathing, toileting, dressing, self feeding, reach over head, and hygiene/grooming  PARTICIPATION LIMITATIONS: meal prep, cleaning, laundry, interpersonal relationship, driving, shopping, and community activity  PERSONAL FACTORS: Medical history: arthritis, chronic back pain(stenosis), GERD, hyperlipidemia, HTN, OA, history of Lt knee troubles.  are also affecting patient's functional outcome.   REHAB POTENTIAL: Fair to good  CLINICAL DECISION MAKING: Evolving/moderate complexity  EVALUATION COMPLEXITY: Moderate   GOALS: Goals reviewed with patient? Yes  SHORT TERM GOALS: (target date for Short term goals are 3 weeks 07/14/2024)  1.Patient will demonstrate independent use of home exercise program to maintain progress from in clinic treatments. Goal status: MET 07/10/24  LONG TERM GOALS: (target dates for all long term goals are 10 weeks  09/01/2024 )   1. Patient will demonstrate/report pain at worst less than or equal to 2/10 to facilitate minimal limitation in daily activity secondary to pain symptoms. Goal status: Ongoing  08/09/2024   2. Patient will demonstrate independent use of home exercise program to facilitate ability to maintain/progress functional gains from skilled physical therapy services. Goal status: Ongoing  08/09/2024   3. Patient will demonstrate Patient specific functional scale avg > or = 7/10 to indicate reduced disability due to condition.  Goal status: Met 08/07/24   4.  Patient will demonstrate Lt  UE MMT 4/5 or greater throughout to  facilitate lifting, reaching, carrying at [facility] in daily activity.   Goal status: Ongoing  08/09/2024   5.  Patient will demonstrate Lt  [facility] joint AROM WFL s symptoms to facilitate usual overhead reaching, self care, dressing at PLOF.    Goal status: Ongoing  08/09/2024   6.  Patient will demonstrate/report ability to sleep s restriction due to symptoms.  Goal status: Ongoing  08/09/2024     PLAN:  PT FREQUENCY: 1-2x/week  PT DURATION: 10 weeks  PLANNED INTERVENTIONS: Can include 02853- PT Re-evaluation, 97110-Therapeutic exercises, 97530- Therapeutic activity, 97112- Neuromuscular re-education, 97535- Self Care, 97140- Manual therapy, [phone]- Gait training, [phone]- Orthotic Fit/training, [phone]- Canalith repositioning, [id number]- Aquatic Therapy, [phone]- Electrical stimulation (unattended), [id number] Physical performance testing, 97016- Vasopneumatic device, [id number]- Ultrasound, [id number]- Traction (mechanical), [id number]- Ionotophoresis 4mg /ml Dexamethasone ,  79439 - Needle insertion w/o injection 1 or 2 muscles, 20561 - Needle insertion w/o injection 3 or more muscles.   Patient/Family education, Balance training, Stair training, Taping, Dry Needling, Joint mobilization, Joint manipulation, Spinal manipulation, Spinal mobilization, Scar mobilization, Vestibular training, Visual/preceptual remediation/compensation, DME instructions, Cryotherapy, and Moist heat.  All performed as medically necessary.  All included unless contraindicated  PLAN FOR NEXT SESSION: continue to facilitate functional strength with upright posture.     Grayce Spatz, PT, DPT 08/09/2024, 12:39 PM  "

## 2024-08-14 ENCOUNTER — Encounter: Admitting: Physical Therapy

## 2024-08-16 ENCOUNTER — Encounter: Admitting: Physical Therapy

## 2024-08-21 ENCOUNTER — Encounter: Admitting: Physical Therapy

## 2024-08-22 ENCOUNTER — Ambulatory Visit: Admitting: Sports Medicine

## 2024-08-24 NOTE — Therapy (Incomplete)
 " OUTPATIENT PHYSICAL THERAPY TREATMENT    Patient Name: Melissa Maldonado MRN: [id number] DOB:16-Feb-1941, 84 y.o., female Today's Date: 08/24/2024  END OF SESSION:    Past Medical History:  Diagnosis Date   Arthritis    Chronic back pain    lumbar stenosis   Dysrhythmia    Early cataracts, bilateral    GERD (gastroesophageal reflux disease)    occasionally but no meds required   History of blood transfusion    History of colon polyps    Hyperlipidemia    takes Lipitor every other day   Hypertension    takes losartan  daily   Neuropathy    Osteoarthritis of left knee 04/27/2014   Restless leg    Shortness of breath    with exertion   SOB (shortness of breath) 2013   CPET   Tear of medial meniscus of left knee 09/08/2013   Past Surgical History:  Procedure Laterality Date   APPENDECTOMY     BACK SURGERY  07/20/2008   lumb fusion   CARDIOVERSION N/A 01/29/2023   Procedure: CARDIOVERSION;  Surgeon: Alvan Ronal BRAVO, MD;  Location: [facility];  Service: Cardiovascular;  Laterality: N/A;   COLONOSCOPY  2018   EYE SURGERY     both cataracts   KNEE ARTHROSCOPY WITH MEDIAL MENISECTOMY Left 09/08/2013   Procedure: LEFT KNEE ARTHROSCOPY WITH PARTIAL MEDIAL MENISCECTOMY;  Surgeon: Fonda SHAUNNA Olmsted, MD;  Location: [facility];  Service: Orthopedics;  Laterality: Left;   LUMBAR LAMINECTOMY/DECOMPRESSION MICRODISCECTOMY  05/13/2012   Procedure: LUMBAR LAMINECTOMY/DECOMPRESSION MICRODISCECTOMY 1 LEVEL;  Surgeon: Victory Gens, MD;  Location: [facility];  Service: Neurosurgery;  Laterality: Bilateral;  Bilateral Lumbar one -two Decompressive Laminectomy   PARTIAL KNEE ARTHROPLASTY Left 04/27/2014   Procedure: LEFT UNICOMPARTMENTAL KNEE;  Surgeon: Fonda SHAUNNA Olmsted, MD;  Location: [facility];  Service: Orthopedics;  Laterality: Left;   TOTAL KNEE ARTHROPLASTY Right 11/06/2022   Procedure: RIGHT TOTAL KNEE ARTHROPLASTY;  Surgeon: Vernetta Lonni GRADE, MD;   Location: [facility];  Service: Orthopedics;  Laterality: Right;   TUBAL LIGATION  1974   UPPER GI ENDOSCOPY     Patient Active Problem List   Diagnosis Date Noted   Persistent atrial fibrillation (HCC) 01/25/2023   Hypercoagulable state due to persistent atrial fibrillation (HCC) 01/25/2023   Status post total right knee replacement 11/06/2022   Arthritis of right acromioclavicular joint 08/28/2021   Impingement syndrome of right shoulder 08/19/2021   Nontraumatic incomplete tear of right rotator cuff 08/19/2021   Paresthesia 12/05/2020   Low back pain with right-sided sciatica 12/05/2020   Trochanteric bursitis, right hip 04/24/2020   Essential hypertension 01/12/2020   Sinus bradycardia 01/12/2020   Dyspnea on exertion 01/12/2020   Lumbar stenosis with neurogenic claudication 02/01/2017   Osteoarthritis of left knee 04/27/2014    PCP: Clarice Nottingham MD  REFERRING PROVIDER: Burnetta Brunet, DO  REFERRING DIAG: [phone] (ICD-10-CM) - Chronic left shoulder pain M19.012 (ICD-10-CM) - Primary osteoarthritis, left shoulder M75.102,M12.812 (ICD-10-CM) - Rotator cuff tear arthropathy of left shoulder  THERAPY DIAG:  No diagnosis found.  Rationale for Evaluation and Treatment: Rehabilitation  ONSET DATE: Oct or Nov 2025.   SUBJECTIVE:  SUBJECTIVE STATEMENT: *** She had rough night last night with shoulder which thinks may be related to weather changes. Rt hand dominant.   PERTINENT HISTORY: Medical history: arthritis, chronic back pain(stenosis), GERD, hyperlipidemia, HTN, OA, history of Lt knee troubles.   PAIN: *** NPRS scale:  in last week lowest 4/10 highest 8/10  Pain location: Lt shoulder, anterior/lateral shoulder, upper Trapezius Pain description: ache Aggravating factors: reaching,  lifting, getting dressed.  Relieving factors: topical cream helps short term  PRECAUTIONS: None  WEIGHT BEARING RESTRICTIONS: No  FALLS:  Has patient fallen in last 6 months? No  LIVING ENVIRONMENT: Lives in: House/apartment  OCCUPATION: Retired  PLOF: Independent, go to gym.   PATIENT GOALS: Reduce pain,    OBJECTIVE:   DIAGNOSTIC FINDINGS:  05/30/2024 xray:   X-rays of the shoulder show significant degenerative spurring of the  glenohumeral joint with significant joint space narrowing.   06/27/24:  IMPRESSION: 1. Severe tendinosis of the supraspinatus tendon with a shallow partial-thickness bursal surface tear towards the anterior insertion. 2. Severe infraspinatus tendinosis with a interstitial tear measuring 16 mm medial-lateral and 7 mm AP towards the anterior aspect. 3. Severe subscapularis tendinosis with a small partial-thickness articular surface tear. 4. Moderate tendinosis of the intra-articular portion of the long head of the biceps tendon. 5. Severe subacromial/subdeltoid bursitis. 6. Moderate-severe osteoarthritis of the glenohumeral joint.  PATIENT SURVEYS:  Patient-Specific Activity Scoring Scheme  0 represents unable to perform. 10 represents able to perform at prior level. 0 1 2 3 4 5 6 7 8 9  10 (Date and Score)   Activity Eval  06/23/24 07/10/24  08/07/24  1. sleep  4 6  8   2. Getting dressed  2  7 8   3. Reaching, lifting household 0 0 6  Score 2 avg 4.33 7.3   Total score = sum of the activity scores/number of activities Minimum detectable change (90%CI) for average score = 2 points Minimum detectable change (90%CI) for single activity score = 3 points  COGNITION: 06/23/2024 Overall cognitive status: WFL     SENSATION: 06/23/2024 WFL  POSTURE: 06/23/2024 Rounded shoulders, FHP.   UPPER EXTREMITY ROM:   ROM Left Eval 06/23/2024 Left 07/06/24 07/10/24 Left Left 07/21/2024 Left 07/31/24  Shoulder flexion < 20 deg with  pain against gravity in supine  120 PROM in supine with pain Supine P: 144* A: 30* limited by pain & weakness Supine P: 108 110 AROM against gravity in sitting.   132 AROM in supine Supine AA: 135  Sitting:  A: 132   Shoulder extension       Shoulder abduction       Shoulder adduction       Shoulder internal rotation 90 deg PROM in supine 45 deg abduction Supine P: 55*  85 AROM in 45 deg abduction supine   Shoulder external rotation 10 deg PROM with pain, in supine 45 deg abduction Supine P: 66* Supine A: 50 P: 55 70 AROM in 45 deg abduction supine Supine A: 60 c mild pain, shoulder abd 45 deg  Elbow flexion       Elbow extension       Wrist flexion       Wrist extension       Wrist ulnar deviation       Wrist radial deviation       Wrist pronation       Wrist supination       (Blank rows = not tested)  UPPER EXTREMITY MMT:  MMT  Right Eval 06/23/2024 Left Eval 06/23/2024 Left 07/21/2024  Shoulder flexion 5/5 1+/5 2+/5  Shoulder extension     Shoulder abduction 5/5 1+/5   Shoulder adduction     Shoulder internal rotation 5/5 4/5   Shoulder external rotation 5/5 1+/5 3/5  Middle trapezius     Lower trapezius     Elbow flexion 5/5    Elbow extension 5/5    Wrist flexion     Wrist extension     Wrist ulnar deviation     Wrist radial deviation     Wrist pronation     Wrist supination     Grip strength (lbs)     (Blank rows = not tested)  SHOULDER SPECIAL TESTS: 06/23/2024 (+) Painful arc, drop arm, ER lag testing Lt arm  JOINT MOBILITY TESTING:  07/21/2024: Normal mobility in mid ranges, less muscle guarding to passive movement assessment compared to eval.   06/23/2024 No limits noted in inferior and posterior glides Lt shoulder.   PALPATION:  07/26/2024: Trigger points noted in Lt upper trap.   06/23/2024 Tenderness to light touch around Lt shoulder joint.      ---------------------------------------------------------------------------------------------------------------------------------------------------------                                                                                                                                                                                              TODAY'S TREATMENT: 08/28/24   TREATMENT:                                                                                                       DATE:  08/09/2024 TherEx Pulley AAROM with focus on posture and full motion with eccentric control (minimal pulley assist) 2 min ea for flexion & scapation.   Neuro Re-Ed Posture stabilization seated with chin tucks 10 reps & scapular retraction 10 reps with PT tactile & verbal cues.  Supine scapular stabilization with blade 30 sec ea - flexion / extension, abduction / adduction & rotation.   TherActivites:  Red theraband reactive ER stabilizaton step outs 5 reps, then active ER with PT cues on posture.   Red theraband reactive IR stabilizaton step outs 5 reps, then active IR with PT  cues on posture.  Reaching up to 1st clinic shelf in gym x 10 , repeated c 1 #  Reaching to second clinic shelf in gym x 10 , repeated c 1 #   Manual Soft tissue mob to upper trapezius trigger points.   Self-Care: Pt requested education on bra selection to address shoulder pain.  PT educated on need for posture which supporting breast will effect.  PT instructed in measuring chest & breast for proper size.  Design should have material covering over large area for support especially in upper back that facilitates upright posture.  Pt verbalized understanding.    TREATMENT:                                                                                                       DATE:  08/07/2024 TherEx Side lying shoulder ER: 2 x 10 c 1 # weight Side lying shoulder Abd: 2 x 10 c 1 # weight Supine shoulder flexion 2 # bar 2 x 10   Supine chest press: 2 x 10  Neuro Re-Ed Supine scapular stabilization with blade 30 sec ea - flexion / extension, abduction / adduction & rotation.  TherActivites:  Nustep: UE/LE push pull x 8 minutes Wall ladder LUE shoulder abduction x 10  Reaching up to 1st clinic shelf in gym x 10 , repeated c 1 #  Reaching to second clinic shelf in gym x 10 , repeated c 1 #  Manaul Supine Lt shoulder inferior g2 GH joint glides, Posterior glides, PROM in all planes to pt's tolerance    TREATMENT:                                                                                                       DATE:  08/02/2024 TherEx Side lying shoulder ER: 2 x 10 c 1 # weight Side lying shoulder Abd: 2 x 10 c 1 # weight Upper trap stretch x 2 bil holding chair seat 10 sec Scapular depression reaching towards foot of bed 10 reps  Self-Care: PT demo & verbal cues on using Thera Cane for trigger point mobilization. Pt verbalized and return demo understanding.   Neuro Re-Ed Supine scapular stabilization with blade 30 sec ea - flexion / extension, abduction / adduction & rotation.   TherAct UBE: x 2 minutes each direction Wall ladder LUE shoulder flexion & abduction with PT demo & verbal cues on technique including weight shift & following hand with eyes. 5 reps ea motion.  Pendulum following to relax shoulder.  Pulley with focus on eccentric lowering control Lt arm in flexion 3 mins Chest press c 3 # bar 2 x 10  Manaul Supine Lt shoulder inferior g2 GH joint glides, Posterior glides, PROM in all planes to pt's tolerance    TREATMENT:                                                                                                       DATE:  07/31/2024 TherEx Side lying shoulder ER: 2 x 10 c 1 # weight Side lying shoulder Abd: 2 x 10 c 1 # weight Upper trap stretch x 2 bil holding 10 sec Neuro Re-Ed Supine shoulder flexion in mid range 45 deg to 100 deg c 2 # weight x 15 Supine shoulder protraction  with circles performed both directions x 15 c 2 # weight TherAct Pulley with focus on eccentric lowering control Lt arm in flexion 3 mins Chest press c 3 # bar 2 x 10  Standing Rt arm assisting Lt arm into full flexion with outstretched arm, eccentric lowering x 10  UBE: x 2 minutes each direction Manaul Supine Lt shoulder inferior g2 GH joint glides, Posterior glides, PROM in all planes to pt's tolerance      HOME EXERCISE PROGRAM: Access Code: [email or web address] URL: [email or web address] Date: 07/21/2024 Prepared by: Ozell Silvan  Exercises - Seated Scapular Retraction  - 3-5 x daily - 7 x weekly - 1 sets - 10 reps - 3-5 hold - Seated Isometric Shoulder External Rotation with Towel (Mirrored)  - 1-2 x daily - 7 x weekly - 1 sets - 10 reps - 5 hold - Standing Isometric Shoulder Flexion with Doorway - Arm Bent (Mirrored)  - 1-2 x daily - 7 x weekly - 1 sets - 10 reps - 5 hold - Circular Shoulder Pendulum with Table Support  - 2 x daily - 7 x weekly - 1 sets - 10 reps - Supine Shoulder Flexion Extension Full Range AROM (Mirrored)  - 1-2 x daily - 7 x weekly - 1-2 sets - 10-20 reps - Sidelying Shoulder Abduction Palm Forward (Mirrored)  - 1-2 x daily - 7 x weekly - 2-3 sets - 10-15 reps - Sidelying Shoulder External Rotation  - 1-2 x daily - 7 x weekly - 2-3 sets - 10-15 reps  ASSESSMENT:  CLINICAL IMPRESSION: *** Patient appears to understand importance of posture for shoulder functions but needs reminders.  Patient appears to understand use of pulleys for full range assisted motions.      OBJECTIVE IMPAIRMENTS: decreased activity tolerance, decreased balance, decreased coordination, decreased endurance, decreased mobility, decreased ROM, decreased strength, hypomobility, increased fascial restrictions, impaired perceived functional ability, increased muscle spasms, impaired flexibility, impaired UE functional use, improper body mechanics, postural dysfunction, and pain.    ACTIVITY LIMITATIONS: carrying, lifting, sleeping, bed mobility, bathing, toileting, dressing, self feeding, reach over head, and hygiene/grooming  PARTICIPATION LIMITATIONS: meal prep, cleaning, laundry, interpersonal relationship, driving, shopping, and community activity  PERSONAL FACTORS: Medical history: arthritis, chronic back pain(stenosis), GERD, hyperlipidemia, HTN, OA, history of Lt knee troubles.  are also affecting patient's functional outcome.   REHAB POTENTIAL: Fair to good  CLINICAL DECISION MAKING: Evolving/moderate complexity  EVALUATION COMPLEXITY: Moderate  GOALS: *** Goals reviewed with patient? Yes  SHORT TERM GOALS: (target date for Short term goals are 3 weeks 07/14/2024)  1.Patient will demonstrate independent use of home exercise program to maintain progress from in clinic treatments. Goal status: MET 07/10/24  LONG TERM GOALS: (target dates for all long term goals are 10 weeks  09/01/2024 )   1. Patient will demonstrate/report pain at worst less than or equal to 2/10 to facilitate minimal limitation in daily activity secondary to pain symptoms. Goal status: Ongoing  08/09/2024   2. Patient will demonstrate independent use of home exercise program to facilitate ability to maintain/progress functional gains from skilled physical therapy services. Goal status: Ongoing  08/09/2024   3. Patient will demonstrate Patient specific functional scale avg > or = 7/10 to indicate reduced disability due to condition.  Goal status: Met 08/07/24   4.  Patient will demonstrate Lt  UE MMT 4/5 or greater throughout to facilitate lifting, reaching, carrying at [facility] in daily activity.   Goal status: Ongoing  08/09/2024   5.  Patient will demonstrate Lt  [facility] joint AROM WFL s symptoms to facilitate usual overhead reaching, self care, dressing at PLOF.    Goal status: Ongoing  08/09/2024   6.  Patient will demonstrate/report ability to sleep s restriction due to symptoms.   Goal status: Ongoing  08/09/2024     PLAN:  PT FREQUENCY: 1-2x/week  PT DURATION: 10 weeks  PLANNED INTERVENTIONS: Can include 02853- PT Re-evaluation, 97110-Therapeutic exercises, 97530- Therapeutic activity, 97112- Neuromuscular re-education, 97535- Self Care, 97140- Manual therapy, [phone]- Gait training, [phone]- Orthotic Fit/training, [phone]- Canalith repositioning, [id number]- Aquatic Therapy, [phone]- Electrical stimulation (unattended), [id number] Physical performance testing, 97016- Vasopneumatic device, [id number]- Ultrasound, [id number]- Traction (mechanical), [id number]- Ionotophoresis 4mg /ml Dexamethasone ,  79439 - Needle insertion w/o injection 1 or 2 muscles, 20561 - Needle insertion w/o injection 3 or more muscles.   Patient/Family education, Balance training, Stair training, Taping, Dry Needling, Joint mobilization, Joint manipulation, Spinal manipulation, Spinal mobilization, Scar mobilization, Vestibular training, Visual/preceptual remediation/compensation, DME instructions, Cryotherapy, and Moist heat.  All performed as medically necessary.  All included unless contraindicated  PLAN FOR NEXT SESSION: ** continue to facilitate functional strength with upright posture.     Sign***  "

## 2024-08-28 ENCOUNTER — Encounter: Admitting: Physical Therapy

## 2024-08-31 ENCOUNTER — Ambulatory Visit: Admitting: Sports Medicine

## 2024-09-06 ENCOUNTER — Ambulatory Visit: Admitting: Internal Medicine
